# Patient Record
Sex: Male | Born: 1950 | Race: Black or African American | Hispanic: No | Marital: Married | Smoking: Never smoker
Health system: Southern US, Community
[De-identification: ages and names within clinical notes are randomized; demographics above are authoritative.]

## PROBLEM LIST (undated history)

## (undated) DIAGNOSIS — H409 Unspecified glaucoma: Secondary | ICD-10-CM

## (undated) DIAGNOSIS — I10 Essential (primary) hypertension: Secondary | ICD-10-CM

## (undated) DIAGNOSIS — N189 Chronic kidney disease, unspecified: Secondary | ICD-10-CM

## (undated) DIAGNOSIS — E119 Type 2 diabetes mellitus without complications: Secondary | ICD-10-CM

## (undated) DIAGNOSIS — N400 Enlarged prostate without lower urinary tract symptoms: Secondary | ICD-10-CM

## (undated) HISTORY — PX: VASECTOMY: SHX75

## (undated) HISTORY — PX: EYE SURGERY: SHX253

---

## 1998-08-08 ENCOUNTER — Ambulatory Visit (HOSPITAL_COMMUNITY): Admission: RE | Admit: 1998-08-08 | Discharge: 1998-08-09 | Payer: Self-pay | Admitting: Ophthalmology

## 1998-08-08 ENCOUNTER — Encounter: Payer: Self-pay | Admitting: Ophthalmology

## 2017-12-30 ENCOUNTER — Ambulatory Visit: Payer: Self-pay | Admitting: General Surgery

## 2018-01-13 NOTE — Pre-Procedure Instructions (Signed)
Philip Benjamin  01/13/2018      [facility] - [place], [place] - [place], [place] - [address] [place] [address] Phone: [phone] Fax: [phone]    Your procedure is scheduled on Fri., January 23, 2018  Report to [facility] Admitting Entrance "A" at 8:15AM  Call this number if you have problems the morning of surgery:  [phone]   Remember:  Do not eat food or drink liquids after midnight.  Take these medicines the morning of surgery with A SIP OF WATER: Diltiazem (TIAZAC), Metoprolol tartrate (LOPRESSOR), and Dorzolamide-timolol (COSOPT). If needed Lifitegrast Benay Spice) for dry eyes.  7 days before surgery (Mar. 29), stop taking all Aspirins, Vitamins, Fish oils, and Herbal medications. Also stop all NSAIDS i.e. Advil, Ibuprofen, Motrin, Aleve, Anaprox, Naproxen, BC and Goody Powders.  Please complete your PRE-SURGERY ENSURE that was given to before you leave your house the morning of surgery.  Please, if able, drink it in one setting. DO NOT SIP.  How to Manage Your Diabetes Before and After Surgery  Why is it important to control my blood sugar before and after surgery? . Improving blood sugar levels before and after surgery helps healing and can limit problems. . A way of improving blood sugar control is eating a healthy diet by: o  Eating less sugar and carbohydrates o  Increasing activity/exercise o  Talking with your doctor about reaching your blood sugar goals . High blood sugars (greater than 180 mg/dL) can raise your risk of infections and slow your recovery, so you will need to focus on controlling your diabetes during the weeks before surgery. . Make sure that the doctor who takes care of your diabetes knows about your planned surgery including the date and location.  How do I manage my blood sugar before surgery? . Check your blood sugar at least 4 times a day, starting 2 days before surgery, to make sure that the level is not too  high or low. o Check your blood sugar the morning of your surgery when you wake up and every 2 hours until you get to the Short Stay unit. . If your blood sugar is less than 70 mg/dL, you will need to treat for low blood sugar: o Do not take insulin. o Treat a low blood sugar (less than 70 mg/dL) with  cup of clear juice (cranberry or apple), 4 glucose tablets, OR glucose gel. Recheck blood sugar in 15 minutes after treatment (to make sure it is greater than 70 mg/dL). If your blood sugar is not greater than 70 mg/dL on recheck, call [phone] o  for further instructions. . Report your blood sugar to the short stay nurse when you get to Short Stay.  . If you are admitted to the hospital after surgery: o Your blood sugar will be checked by the staff and you will probably be given insulin after surgery (instead of oral diabetes medicines) to make sure you have good blood sugar levels. o The goal for blood sugar control after surgery is 80-180 mg/dL.  WHAT DO I DO ABOUT MY DIABETES MEDICATION?  . THE NIGHT BEFORE SURGERY, take _____25______ units of ______Lantus_____insulin.      . If your CBG is greater than 220 mg/dL, call us at [phone]   Do not wear jewelry.  Do not wear lotions, powders, colognes, or deodorant.  Do not shave 48 hours prior to surgery.  Men may shave face and neck.  Do not bring valuables to the hospital.  [facility] is not responsible for any belongings or valuables.  Contacts, dentures or bridgework may not be worn into surgery.  Leave your suitcase in the car.  After surgery it may be brought to your room.  For patients admitted to the hospital, discharge time will be determined by your treatment team.  Patients discharged the day of surgery will not be allowed to drive home.   Special instructions:   [place]- Preparing For Surgery  Before surgery, you can play an important role. Because skin is not sterile, your skin needs to be as free of germs as  possible. You can reduce the number of germs on your skin by washing with CHG (chlorahexidine gluconate) Soap before surgery.  CHG is an antiseptic cleaner which kills germs and bonds with the skin to continue killing germs even after washing.  Please do not use if you have an allergy to CHG or antibacterial soaps. If your skin becomes reddened/irritated stop using the CHG.  Do not shave (including legs and underarms) for at least 48 hours prior to first CHG shower. It is OK to shave your face.  Please follow these instructions carefully.   1. Shower the NIGHT BEFORE SURGERY and the MORNING OF SURGERY with CHG.   2. If you chose to wash your hair, wash your hair first as usual with your normal shampoo.  3. After you shampoo, rinse your hair and body thoroughly to remove the shampoo.  4. Use CHG as you would any other liquid soap. You can apply CHG directly to the skin and wash gently with a scrungie or a clean washcloth.   5. Apply the CHG Soap to your body ONLY FROM THE NECK DOWN.  Do not use on open wounds or open sores. Avoid contact with your eyes, ears, mouth and genitals (private parts). Wash Face and genitals (private parts)  with your normal soap.  6. Wash thoroughly, paying special attention to the area where your surgery will be performed.  7. Thoroughly rinse your body with warm water from the neck down.  8. DO NOT shower/wash with your normal soap after using and rinsing off the CHG Soap.  9. Pat yourself dry with a CLEAN TOWEL.  10. Wear CLEAN PAJAMAS to bed the night before surgery, wear comfortable clothes the morning of surgery  11. Place CLEAN SHEETS on your bed the night of your first shower and DO NOT SLEEP WITH PETS.  Day of Surgery: Do not apply any deodorants/lotions. Please wear clean clothes to the hospital/surgery center.    Please read over the following fact sheets that you were given. Pain Booklet, Coughing and Deep Breathing and Surgical Site Infection  Prevention

## 2018-01-14 ENCOUNTER — Encounter (HOSPITAL_COMMUNITY): Payer: Self-pay

## 2018-01-14 ENCOUNTER — Encounter (HOSPITAL_COMMUNITY)
Admission: RE | Admit: 2018-01-14 | Discharge: 2018-01-14 | Disposition: A | Discharge status: Home / Self Care | Payer: PRIVATE HEALTH INSURANCE | Source: Ambulatory Visit | Attending: General Surgery | Admitting: General Surgery

## 2018-01-14 DIAGNOSIS — Z01812 Encounter for preprocedural laboratory examination: Secondary | ICD-10-CM | POA: Insufficient documentation

## 2018-01-14 DIAGNOSIS — Z01818 Encounter for other preprocedural examination: Secondary | ICD-10-CM | POA: Insufficient documentation

## 2018-01-14 DIAGNOSIS — R9431 Abnormal electrocardiogram [ECG] [EKG]: Secondary | ICD-10-CM | POA: Diagnosis not present

## 2018-01-14 DIAGNOSIS — J9811 Atelectasis: Secondary | ICD-10-CM | POA: Insufficient documentation

## 2018-01-14 DIAGNOSIS — Z01810 Encounter for preprocedural cardiovascular examination: Secondary | ICD-10-CM | POA: Diagnosis present

## 2018-01-14 DIAGNOSIS — I10 Essential (primary) hypertension: Secondary | ICD-10-CM | POA: Diagnosis not present

## 2018-01-14 DIAGNOSIS — E1122 Type 2 diabetes mellitus with diabetic chronic kidney disease: Secondary | ICD-10-CM | POA: Insufficient documentation

## 2018-01-14 DIAGNOSIS — R918 Other nonspecific abnormal finding of lung field: Secondary | ICD-10-CM | POA: Insufficient documentation

## 2018-01-14 DIAGNOSIS — N183 Chronic kidney disease, stage 3 (moderate): Secondary | ICD-10-CM | POA: Insufficient documentation

## 2018-01-14 HISTORY — DX: Type 2 diabetes mellitus without complications: E11.9

## 2018-01-14 HISTORY — DX: Essential (primary) hypertension: I10

## 2018-01-14 HISTORY — DX: Chronic kidney disease, unspecified: N18.9

## 2018-01-14 HISTORY — DX: Benign prostatic hyperplasia without lower urinary tract symptoms: N40.0

## 2018-01-14 LAB — HEMOGLOBIN A1C
HEMOGLOBIN A1C: 7 % — AB (ref 4.8–5.6)
Mean Plasma Glucose: 154.2 mg/dL

## 2018-01-14 LAB — CBC WITH DIFFERENTIAL/PLATELET
BASOS ABS: 0 10*3/uL (ref 0.0–0.1)
Basophils Relative: 1 %
EOS PCT: 2 %
Eosinophils Absolute: 0.2 10*3/uL (ref 0.0–0.7)
HCT: 38.1 % — ABNORMAL LOW (ref 39.0–52.0)
Hemoglobin: 12.2 g/dL — ABNORMAL LOW (ref 13.0–17.0)
LYMPHS ABS: 1.2 10*3/uL (ref 0.7–4.0)
LYMPHS PCT: 19 %
MCH: 28.4 pg (ref 26.0–34.0)
MCHC: 32 g/dL (ref 30.0–36.0)
MCV: 88.8 fL (ref 78.0–100.0)
MONO ABS: 0.6 10*3/uL (ref 0.1–1.0)
Monocytes Relative: 9 %
Neutro Abs: 4.4 10*3/uL (ref 1.7–7.7)
Neutrophils Relative %: 69 %
PLATELETS: 178 10*3/uL (ref 150–400)
RBC: 4.29 MIL/uL (ref 4.22–5.81)
RDW: 12.7 % (ref 11.5–15.5)
WBC: 6.4 10*3/uL (ref 4.0–10.5)

## 2018-01-14 LAB — COMPREHENSIVE METABOLIC PANEL
ALT: 25 U/L (ref 17–63)
ANION GAP: 11 (ref 5–15)
AST: 23 U/L (ref 15–41)
Albumin: 3.4 g/dL — ABNORMAL LOW (ref 3.5–5.0)
Alkaline Phosphatase: 105 U/L (ref 38–126)
BUN: 18 mg/dL (ref 6–20)
CHLORIDE: 105 mmol/L (ref 101–111)
CO2: 20 mmol/L — ABNORMAL LOW (ref 22–32)
Calcium: 9.3 mg/dL (ref 8.9–10.3)
Creatinine, Ser: 1.76 mg/dL — ABNORMAL HIGH (ref 0.61–1.24)
GFR calc non Af Amer: 38 mL/min — ABNORMAL LOW (ref >60)
GFR, EST AFRICAN AMERICAN: 44 mL/min — AB (ref >60)
Glucose, Bld: 162 mg/dL — ABNORMAL HIGH (ref 65–99)
POTASSIUM: 4.5 mmol/L (ref 3.5–5.1)
Sodium: 136 mmol/L (ref 135–145)
Total Bilirubin: 0.6 mg/dL (ref 0.3–1.2)
Total Protein: 6.3 g/dL — ABNORMAL LOW (ref 6.5–8.1)

## 2018-01-14 LAB — GLUCOSE, CAPILLARY: GLUCOSE-CAPILLARY: 143 mg/dL — AB (ref 65–99)

## 2018-01-14 MED ORDER — CHLORHEXIDINE GLUCONATE CLOTH 2 % EX PADS: 6.0000 | MEDICATED_PAD | Freq: Once | CUTANEOUS | Status: DC

## 2018-01-16 ENCOUNTER — Encounter (HOSPITAL_COMMUNITY): Payer: Self-pay

## 2018-01-16 NOTE — Progress Notes (Signed)
Anesthesia Chart Review:  Pt is a 67 year old male scheduled for open repair bilateral inguinal hernia with mesh on 01/23/2018 with Clement SayresJames White, MD  - PCP is Kirstie PeriAshish Shah, MD - Nephrologist is Dr. Greggory StallionGeorge at [facility] in [place], [place]. Last office visit 11/03/17  PMH includes: HTN, DM, CKD (stage III). Never smoker. BMI 33.5  Medications include: Lipitor, diltiazem, Lasix, Lantus, losartan, metoprolol  No VS documented at pre-admission testing  Preoperative labs reviewed.   - HbA1c 7.0, glucose 162 - Cr 1.76, BUN 18. Nephrology note indicates pt's baseline Cr is 1.6  CXR 01/14/18: Bibasilar atelectasis/scarring.  EKG 01/14/18: NSR.  Repolarization abnormality.  If no changes, I anticipate pt can proceed with surgery as scheduled.   Rica Mastngela Laylani Pudwill, FNP-BC [facility] Short Stay Surgical Center/Anesthesiology Phone: [phone] 01/19/2018 4:23 PM

## 2018-01-22 NOTE — H&P (Signed)
Philip Benjamin Documented: 12/30/2017 10:21 AM Location: [facility] Patient #: [id number] DOB: [birth year] Married / Language: English / Race: Black or African American Male   History of Present Illness Marta Lamas. Lindie Spruce MD; 12/30/2017 10:46 AM) Patient words: Hernias detected by my primary physician.  The patient is a 67 year old male who presents with an inguinal hernia. The hernia(s) is/are located on both sides (Large). No changes in management were made at the last visit. Symptoms include inguinal bulge, scrotal mass, inguinal pain and scrotal pain. The pain is located in the left hemiscrotum, in the left lower quadrant, in the right hemiscrotum and in the right lower quadrant. The patient describes the pain as dull. Onset was gradual 2 year(s) ago. The symptoms occur occasionally. The patient describes this as mild.   Past Surgical History (Tanisha A. Manson Passey, RMA; 12/30/2017 10:22 AM) Vasectomy   Diagnostic Studies History (Tanisha A. Manson Passey, RMA; 12/30/2017 10:22 AM) Colonoscopy  1-5 years ago  Allergies (Tanisha A. Manson Passey, RMA; 12/30/2017 10:23 AM) No Known Drug Allergies [12/30/2017]: Allergies Reconciled   Medication History (Tanisha A. Manson Passey, RMA; 12/30/2017 10:23 AM) Losartan Potassium (100MG  Tablet, Oral) Active. Metoprolol Tartrate (25MG  Tablet, Oral) Active. Lantus (100UNIT/ML Solution, Subcutaneous) Active. Furosemide (20MG  Tablet, Oral) Active. Brimonidine Tartrate (0.15% Solution, Ophthalmic) Active. Atorvastatin Calcium (20MG  Tablet, Oral) Active. Vitamin D (Ergocalciferol) (50000UNIT Capsule, Oral) Active. Dorzolamide HCl-Timolol Mal (22.3-6.8MG /ML Solution, Ophthalmic) Active. Durezol (0.05% Emulsion, Ophthalmic) Active. Medications Reconciled  Social History (Tanisha A. Manson Passey, RMA; 12/30/2017 10:22 AM) Caffeine use  Carbonated beverages, Tea. No alcohol use  No drug use  Tobacco use  Never smoker.  Family History (Tanisha A. Manson Passey,  RMA; 12/30/2017 10:22 AM) Alcohol Abuse  Brother, Father, Mother. Diabetes Mellitus  Brother, Mother. Heart Disease  Brother, Mother. Hypertension  Brother, Mother. Kidney Disease  Brother. Prostate Cancer  Father.  Other Problems (Tanisha A. Manson Passey, RMA; 12/30/2017 10:22 AM) Back Pain  Chronic Renal Failure Syndrome  Diabetes Mellitus  Enlarged Prostate  Gastric Ulcer  Hemorrhoids  High blood pressure  Hypercholesterolemia     Review of Systems (Tanisha A. Brown RMA; 12/30/2017 10:22 AM) General Present- Fatigue. Not Present- Appetite Loss, Chills, Fever, Night Sweats, Weight Gain and Weight Loss. Skin Present- Dryness. Not Present- Change in Wart/Mole, Hives, Jaundice, New Lesions, Non-Healing Wounds, Rash and Ulcer. HEENT Present- Ringing in the Ears and Wears glasses/contact lenses. Not Present- Earache, Hearing Loss, Hoarseness, Nose Bleed, Oral Ulcers, Seasonal Allergies, Sinus Pain, Sore Throat, Visual Disturbances and Yellow Eyes. Respiratory Not Present- Bloody sputum, Chronic Cough, Difficulty Breathing, Snoring and Wheezing. Breast Not Present- Breast Mass, Breast Pain, Nipple Discharge and Skin Changes. Cardiovascular Present- Swelling of Extremities. Not Present- Chest Pain, Difficulty Breathing Lying Down, Leg Cramps, Palpitations, Rapid Heart Rate and Shortness of Breath. Gastrointestinal Present- Hemorrhoids. Not Present- Abdominal Pain, Bloating, Bloody Stool, Change in Bowel Habits, Chronic diarrhea, Constipation, Difficulty Swallowing, Excessive gas, Gets full quickly at meals, Indigestion, Nausea, Rectal Pain and Vomiting. Male Genitourinary Present- Impotence. Not Present- Blood in Urine, Change in Urinary Stream, Frequency, Nocturia, Painful Urination, Urgency and Urine Leakage. Musculoskeletal Present- Back Pain and Joint Stiffness. Not Present- Joint Pain, Muscle Pain, Muscle Weakness and Swelling of Extremities. Neurological Present- Tingling. Not  Present- Decreased Memory, Fainting, Headaches, Numbness, Seizures, Tremor, Trouble walking and Weakness. Psychiatric Not Present- Anxiety, Bipolar, Change in Sleep Pattern, Depression, Fearful and Frequent crying. Endocrine Present- Cold Intolerance. Not Present- Excessive Hunger, Hair Changes, Heat Intolerance, Hot flashes and New Diabetes. Hematology Not Present-  Blood Thinners, Easy Bruising, Excessive bleeding, Gland problems, HIV and Persistent Infections.  Vitals (Tanisha A. Brown RMA; 12/30/2017 10:22 AM) 12/30/2017 10:22 AM Weight: 258.6 lb Height: 73in Body Surface Area: 2.4 m Body Mass Index: 34.12 kg/m  Temp.: 98.43F  Pulse: 80 (Regular)  BP: 148/84 (Sitting, Left Arm, Standard) BP elevated today at 180/92, P 77  General Mental Status-Alert. General Appearance-Cooperative and Well groomed. Orientation-Oriented X4. Build & Nutrition-Obese(Mild). Voice-Normal. Health Status-General Health Good(Has Stage III CKD).  Chest and Lung Exam Chest and lung exam reveals -normal excursion with symmetric chest walls, quiet, even and easy respiratory effort with no use of accessory muscles, non-tender and normal tactile fremitus and on auscultation, normal breath sounds, no adventitious sounds and normal vocal resonance.  Cardiovascular Cardiovascular examination reveals -on palpation PMI is normal in location and amplitude, no palpable S3 or S4. Normal cardiac borders., normal heart sounds, regular rate and rhythm with no murmurs and femoral artery auscultation bilaterally reveals normal pulses, no bruits, no thrills.  Abdomen Inspection Hernias - Inguinal hernia - Bilateral - Reducible(Large and reducible with immediate return). Palpation/Percussion Palpation and Percussion of the abdomen reveal - Soft and Non Tender. Auscultation Auscultation of the abdomen reveals - Bowel sounds normal.  Assessment & Plan Fayrene Fearing O. Temia Debroux MD; 12/30/2017 10:55  AM) BILATERAL INGUINAL HERNIA (K40.20) Impression: Large Bilateral inguinal hernias in a large man. Reducible but immediately recurs. Minimally tender.  Will need open repairs bilaterally with mesh. Risks and benefits explained to the patient Current Plans:  Open bilateral inguinal hernia repair with mesh  Marta Lamas. Gae Bon, MD, FACS [phone] [facility]

## 2018-01-23 ENCOUNTER — Encounter (HOSPITAL_COMMUNITY): Payer: Self-pay | Admitting: Surgery

## 2018-01-23 ENCOUNTER — Ambulatory Visit (HOSPITAL_COMMUNITY): Payer: PRIVATE HEALTH INSURANCE | Admitting: Vascular Surgery

## 2018-01-23 ENCOUNTER — Other Ambulatory Visit: Payer: Self-pay

## 2018-01-23 ENCOUNTER — Ambulatory Visit (HOSPITAL_COMMUNITY)
Admission: RE | Admit: 2018-01-23 | Discharge: 2018-01-23 | Disposition: A | Discharge status: Home / Self Care | Payer: PRIVATE HEALTH INSURANCE | Source: Ambulatory Visit | Attending: General Surgery | Admitting: General Surgery

## 2018-01-23 ENCOUNTER — Encounter (HOSPITAL_COMMUNITY)
Admission: RE | Disposition: A | Discharge status: Home / Self Care | Payer: Self-pay | Source: Ambulatory Visit | Attending: General Surgery

## 2018-01-23 ENCOUNTER — Ambulatory Visit (HOSPITAL_COMMUNITY): Payer: PRIVATE HEALTH INSURANCE | Admitting: Emergency Medicine

## 2018-01-23 DIAGNOSIS — E1122 Type 2 diabetes mellitus with diabetic chronic kidney disease: Secondary | ICD-10-CM | POA: Diagnosis not present

## 2018-01-23 DIAGNOSIS — I129 Hypertensive chronic kidney disease with stage 1 through stage 4 chronic kidney disease, or unspecified chronic kidney disease: Secondary | ICD-10-CM | POA: Insufficient documentation

## 2018-01-23 DIAGNOSIS — N189 Chronic kidney disease, unspecified: Secondary | ICD-10-CM | POA: Diagnosis not present

## 2018-01-23 DIAGNOSIS — Z794 Long term (current) use of insulin: Secondary | ICD-10-CM | POA: Diagnosis not present

## 2018-01-23 DIAGNOSIS — K4020 Bilateral inguinal hernia, without obstruction or gangrene, not specified as recurrent: Secondary | ICD-10-CM | POA: Insufficient documentation

## 2018-01-23 DIAGNOSIS — E7800 Pure hypercholesterolemia, unspecified: Secondary | ICD-10-CM | POA: Insufficient documentation

## 2018-01-23 DIAGNOSIS — Z79899 Other long term (current) drug therapy: Secondary | ICD-10-CM | POA: Insufficient documentation

## 2018-01-23 HISTORY — PX: INSERTION OF MESH: SHX5868

## 2018-01-23 HISTORY — PX: INGUINAL HERNIA REPAIR: SHX194

## 2018-01-23 LAB — GLUCOSE, CAPILLARY
GLUCOSE-CAPILLARY: 125 mg/dL — AB (ref 65–99)
Glucose-Capillary: 295 mg/dL — ABNORMAL HIGH (ref 65–99)
Glucose-Capillary: 218 mg/dL — ABNORMAL HIGH (ref 65–99)

## 2018-01-23 SURGERY — REPAIR, HERNIA, INGUINAL, BILATERAL, ADULT
Anesthesia: General | Site: Groin | Laterality: Bilateral

## 2018-01-23 MED ORDER — OXYCODONE HCL 5 MG PO TABS: 5.0000 mg | ORAL_TABLET | Freq: Four times a day (QID) | ORAL | 0 refills | Status: DC | PRN

## 2018-01-23 MED ORDER — SODIUM CHLORIDE 0.9 % IV SOLN
INTRAVENOUS | Status: AC
  Filled 2018-01-23: qty 500000

## 2018-01-23 MED ORDER — BUPIVACAINE-EPINEPHRINE (PF) 0.5% -1:200000 IJ SOLN
INTRAMUSCULAR | Status: AC
  Filled 2018-01-23: qty 30

## 2018-01-23 MED ORDER — EPHEDRINE SULFATE 50 MG/ML IJ SOLN
INTRAMUSCULAR | Status: DC | PRN
  Administered 2018-01-23 (×2): 5 mg via INTRAVENOUS
  Administered 2018-01-23: 10 mg via INTRAVENOUS

## 2018-01-23 MED ORDER — BUPIVACAINE HCL (PF) 0.25 % IJ SOLN
INTRAMUSCULAR | Status: AC
  Filled 2018-01-23: qty 30

## 2018-01-23 MED ORDER — HYDROCODONE-ACETAMINOPHEN 7.5-325 MG PO TABS: 1.0000 | ORAL_TABLET | Freq: Once | ORAL | Status: DC | PRN

## 2018-01-23 MED ORDER — PROMETHAZINE HCL 25 MG/ML IJ SOLN
6.2500 mg | INTRAMUSCULAR | Status: DC | PRN
  Administered 2018-01-23: 6.25 mg via INTRAVENOUS

## 2018-01-23 MED ORDER — LIDOCAINE HCL (PF) 1 % IJ SOLN
INTRAMUSCULAR | Status: AC
  Filled 2018-01-23: qty 30

## 2018-01-23 MED ORDER — ONDANSETRON HCL 4 MG/2ML IJ SOLN
INTRAMUSCULAR | Status: DC | PRN
  Administered 2018-01-23: 4 mg via INTRAVENOUS

## 2018-01-23 MED ORDER — ACETAMINOPHEN 500 MG PO TABS
1000.0000 mg | ORAL_TABLET | ORAL | Status: AC
  Administered 2018-01-23: 1000 mg via ORAL

## 2018-01-23 MED ORDER — LIDOCAINE HCL (CARDIAC) 20 MG/ML IV SOLN
INTRAVENOUS | Status: AC
  Filled 2018-01-23: qty 5

## 2018-01-23 MED ORDER — FENTANYL CITRATE (PF) 100 MCG/2ML IJ SOLN
INTRAMUSCULAR | Status: DC | PRN
  Administered 2018-01-23 (×3): 50 ug via INTRAVENOUS

## 2018-01-23 MED ORDER — FENTANYL CITRATE (PF) 100 MCG/2ML IJ SOLN
INTRAMUSCULAR | Status: AC
  Filled 2018-01-23: qty 2

## 2018-01-23 MED ORDER — SUGAMMADEX SODIUM 200 MG/2ML IV SOLN
INTRAVENOUS | Status: DC | PRN
  Administered 2018-01-23: 300 mg via INTRAVENOUS

## 2018-01-23 MED ORDER — INSULIN ASPART 100 UNIT/ML ~~LOC~~ SOLN
6.0000 [IU] | Freq: Once | SUBCUTANEOUS | Status: AC
  Administered 2018-01-23: 6 [IU] via SUBCUTANEOUS

## 2018-01-23 MED ORDER — PROPOFOL 10 MG/ML IV BOLUS
INTRAVENOUS | Status: DC | PRN
  Administered 2018-01-23: 50 mg via INTRAVENOUS
  Administered 2018-01-23: 150 mg via INTRAVENOUS

## 2018-01-23 MED ORDER — GABAPENTIN 300 MG PO CAPS
300.0000 mg | ORAL_CAPSULE | ORAL | Status: AC
  Administered 2018-01-23: 300 mg via ORAL

## 2018-01-23 MED ORDER — CEFAZOLIN SODIUM-DEXTROSE 2-4 GM/100ML-% IV SOLN
INTRAVENOUS | Status: AC
  Filled 2018-01-23: qty 100

## 2018-01-23 MED ORDER — CELECOXIB 200 MG PO CAPS
ORAL_CAPSULE | ORAL | Status: AC
  Administered 2018-01-23: 200 mg via ORAL
  Filled 2018-01-23: qty 1

## 2018-01-23 MED ORDER — MIDAZOLAM HCL 2 MG/2ML IJ SOLN
INTRAMUSCULAR | Status: AC
  Filled 2018-01-23: qty 2

## 2018-01-23 MED ORDER — EPHEDRINE 5 MG/ML INJ
INTRAVENOUS | Status: AC
  Filled 2018-01-23: qty 10

## 2018-01-23 MED ORDER — SUGAMMADEX SODIUM 200 MG/2ML IV SOLN
INTRAVENOUS | Status: AC
  Filled 2018-01-23: qty 4

## 2018-01-23 MED ORDER — 0.9 % SODIUM CHLORIDE (POUR BTL) OPTIME
TOPICAL | Status: DC | PRN
  Administered 2018-01-23: 1000 mL

## 2018-01-23 MED ORDER — ROCURONIUM BROMIDE 10 MG/ML (PF) SYRINGE
PREFILLED_SYRINGE | INTRAVENOUS | Status: AC
  Filled 2018-01-23: qty 5

## 2018-01-23 MED ORDER — ROCURONIUM BROMIDE 100 MG/10ML IV SOLN
INTRAVENOUS | Status: DC | PRN
  Administered 2018-01-23: 30 mg via INTRAVENOUS

## 2018-01-23 MED ORDER — SODIUM CHLORIDE 0.9 % IV SOLN
INTRAVENOUS | Status: DC | PRN
  Administered 2018-01-23: 500 mL

## 2018-01-23 MED ORDER — ACETAMINOPHEN 10 MG/ML IV SOLN: 1000.0000 mg | Freq: Once | INTRAVENOUS | Status: DC | PRN

## 2018-01-23 MED ORDER — FENTANYL CITRATE (PF) 250 MCG/5ML IJ SOLN
INTRAMUSCULAR | Status: AC
  Filled 2018-01-23: qty 5

## 2018-01-23 MED ORDER — CEFAZOLIN SODIUM-DEXTROSE 2-4 GM/100ML-% IV SOLN
2.0000 g | INTRAVENOUS | Status: AC
  Administered 2018-01-23: 2 g via INTRAVENOUS

## 2018-01-23 MED ORDER — CELECOXIB 200 MG PO CAPS
200.0000 mg | ORAL_CAPSULE | ORAL | Status: AC
  Administered 2018-01-23: 200 mg via ORAL

## 2018-01-23 MED ORDER — ONDANSETRON HCL 4 MG/2ML IJ SOLN
INTRAMUSCULAR | Status: AC
  Filled 2018-01-23: qty 2

## 2018-01-23 MED ORDER — GABAPENTIN 300 MG PO CAPS
ORAL_CAPSULE | ORAL | Status: AC
  Administered 2018-01-23: 300 mg via ORAL
  Filled 2018-01-23: qty 1

## 2018-01-23 MED ORDER — LACTATED RINGERS IV SOLN
INTRAVENOUS | Status: DC
  Administered 2018-01-23 (×2): via INTRAVENOUS

## 2018-01-23 MED ORDER — PROMETHAZINE HCL 25 MG/ML IJ SOLN
INTRAMUSCULAR | Status: AC
  Filled 2018-01-23: qty 1

## 2018-01-23 MED ORDER — PROPOFOL 10 MG/ML IV BOLUS
INTRAVENOUS | Status: AC
  Filled 2018-01-23: qty 20

## 2018-01-23 MED ORDER — HYDROMORPHONE HCL 1 MG/ML IJ SOLN: 0.2500 mg | INTRAMUSCULAR | Status: DC | PRN

## 2018-01-23 MED ORDER — BUPIVACAINE-EPINEPHRINE (PF) 0.5% -1:200000 IJ SOLN
INTRAMUSCULAR | Status: DC | PRN
  Administered 2018-01-23: 30 mL

## 2018-01-23 MED ORDER — MEPERIDINE HCL 50 MG/ML IJ SOLN: 6.2500 mg | INTRAMUSCULAR | Status: DC | PRN

## 2018-01-23 MED ORDER — SUCCINYLCHOLINE CHLORIDE 20 MG/ML IJ SOLN
INTRAMUSCULAR | Status: DC | PRN
  Administered 2018-01-23: 140 mg via INTRAVENOUS

## 2018-01-23 MED ORDER — DEXAMETHASONE SODIUM PHOSPHATE 10 MG/ML IJ SOLN
INTRAMUSCULAR | Status: AC
  Filled 2018-01-23: qty 1

## 2018-01-23 MED ORDER — LIDOCAINE HCL (CARDIAC) 20 MG/ML IV SOLN
INTRAVENOUS | Status: DC | PRN
  Administered 2018-01-23: 70 mg via INTRAVENOUS

## 2018-01-23 MED ORDER — ACETAMINOPHEN 500 MG PO TABS
ORAL_TABLET | ORAL | Status: AC
  Administered 2018-01-23: 1000 mg via ORAL
  Filled 2018-01-23: qty 2

## 2018-01-23 SURGICAL SUPPLY — 55 items
ADH SKN CLS APL DERMABOND .7 (GAUZE/BANDAGES/DRESSINGS) ×1
BAG DECANTER FOR FLEXI CONT (MISCELLANEOUS) ×3 IMPLANT
BLADE CLIPPER SURG (BLADE) IMPLANT
CANISTER SUCT 3000ML PPV (MISCELLANEOUS) IMPLANT
CHLORAPREP W/TINT 26ML (MISCELLANEOUS) ×3 IMPLANT
CLEANER TIP ELECTROSURG 2X2 (MISCELLANEOUS) ×3 IMPLANT
CLOSURE WOUND 1/2 X4 (GAUZE/BANDAGES/DRESSINGS) ×2
COVER SURGICAL LIGHT HANDLE (MISCELLANEOUS) ×3 IMPLANT
DECANTER SPIKE VIAL GLASS SM (MISCELLANEOUS) IMPLANT
DERMABOND ADVANCED (GAUZE/BANDAGES/DRESSINGS) ×2
DERMABOND ADVANCED .7 DNX12 (GAUZE/BANDAGES/DRESSINGS) ×1 IMPLANT
DRAIN PENROSE 1/2X12 LTX STRL (WOUND CARE) ×2 IMPLANT
DRAPE LAPAROTOMY TRNSV 102X78 (DRAPE) ×3 IMPLANT
DRAPE UTILITY XL STRL (DRAPES) ×6 IMPLANT
DRSG TEGADERM 4X4.75 (GAUZE/BANDAGES/DRESSINGS) ×4 IMPLANT
ELECT REM PT RETURN 9FT ADLT (ELECTROSURGICAL) ×3
ELECTRODE REM PT RTRN 9FT ADLT (ELECTROSURGICAL) ×1 IMPLANT
GAUZE SPONGE 4X4 12PLY STRL (GAUZE/BANDAGES/DRESSINGS) ×2 IMPLANT
GAUZE SPONGE 4X4 16PLY XRAY LF (GAUZE/BANDAGES/DRESSINGS) ×3 IMPLANT
GLOVE BIOGEL PI IND STRL 6.5 (GLOVE) IMPLANT
GLOVE BIOGEL PI IND STRL 8 (GLOVE) ×1 IMPLANT
GLOVE BIOGEL PI INDICATOR 6.5 (GLOVE) ×2
GLOVE BIOGEL PI INDICATOR 8 (GLOVE) ×2
GLOVE ECLIPSE 7.5 STRL STRAW (GLOVE) ×3 IMPLANT
GLOVE SURG SS PI 6.5 STRL IVOR (GLOVE) ×2 IMPLANT
GLOVE SURG SS PI 7.0 STRL IVOR (GLOVE) ×2 IMPLANT
GOWN STRL REUS W/ TWL LRG LVL3 (GOWN DISPOSABLE) ×2 IMPLANT
GOWN STRL REUS W/TWL LRG LVL3 (GOWN DISPOSABLE) ×6
KIT BASIN OR (CUSTOM PROCEDURE TRAY) ×3 IMPLANT
KIT TURNOVER KIT B (KITS) ×3 IMPLANT
MESH HERNIA 3X6 (Mesh General) ×2 IMPLANT
NDL HYPO 25GX1X1/2 BEV (NEEDLE) ×1 IMPLANT
NEEDLE HYPO 25GX1X1/2 BEV (NEEDLE) ×3 IMPLANT
NS IRRIG 1000ML POUR BTL (IV SOLUTION) ×3 IMPLANT
PACK SURGICAL SETUP 50X90 (CUSTOM PROCEDURE TRAY) ×3 IMPLANT
PAD ARMBOARD 7.5X6 YLW CONV (MISCELLANEOUS) ×6 IMPLANT
PENCIL BUTTON HOLSTER BLD 10FT (ELECTRODE) ×3 IMPLANT
SPECIMEN JAR SMALL (MISCELLANEOUS) IMPLANT
SPONGE INTESTINAL PEANUT (DISPOSABLE) ×3 IMPLANT
SPONGE LAP 18X18 X RAY DECT (DISPOSABLE) ×3 IMPLANT
STRIP CLOSURE SKIN 1/2X4 (GAUZE/BANDAGES/DRESSINGS) ×3 IMPLANT
SUT ETHIBOND 0 MO6 C/R (SUTURE) ×6 IMPLANT
SUT MON AB 4-0 PC3 18 (SUTURE) ×6 IMPLANT
SUT PROLENE 0 CT 2 (SUTURE) ×12 IMPLANT
SUT VIC AB 3-0 SH 27 (SUTURE) ×15
SUT VIC AB 3-0 SH 27X BRD (SUTURE) ×4 IMPLANT
SUT VICRYL AB 3 0 TIES (SUTURE) ×6 IMPLANT
SYR BULB 3OZ (MISCELLANEOUS) ×3 IMPLANT
SYR CONTROL 10ML LL (SYRINGE) ×3 IMPLANT
TOWEL OR 17X24 6PK STRL BLUE (TOWEL DISPOSABLE) ×3 IMPLANT
TOWEL OR 17X26 10 PK STRL BLUE (TOWEL DISPOSABLE) ×3 IMPLANT
TRAY URETHRAL FOLEY CATH 14FR (CATHETERS) ×2 IMPLANT
TUBE CONNECTING 12'X1/4 (SUCTIONS) ×2
TUBE CONNECTING 12X1/4 (SUCTIONS) ×2 IMPLANT
YANKAUER SUCT BULB TIP NO VENT (SUCTIONS) ×4 IMPLANT

## 2018-01-23 NOTE — Discharge Instructions (Addendum)
Open Inguinal Hernia Repair, Adult, Care After This sheet gives you information about how to care for yourself after your procedure. Your health care provider may also give you more specific instructions. If you have problems or questions, contact your health care provider. What can I expect after the procedure? After the procedure, it is common to have:  Pain.  Swelling and bruising around the incision area.  Scrotal swelling, in men.  Some fluid or blood draining from your incisions.  Follow these instructions at home: Incision care  Follow instructions from your health care provider about how to take care of your incisions. Make sure you: ? Wash your hands with soap and water before you change your bandage (dressing). If soap and water are not available, use hand sanitizer. ? Change your dressing as told by your health care provider. ? Leave stitches (sutures), skin glue, or adhesive strips in place. These skin closures may need to stay in place for 2 weeks or longer. If adhesive strip edges start to loosen and curl up, you may trim the loose edges. Do not remove adhesive strips completely unless your health care provider tells you to do that.  Check your incision area every day for signs of infection. Check for: ? More redness, swelling, or pain. ? More fluid or blood. ? Warmth. ? Pus or a bad smell.  Wear loose, soft clothing while your incisions heal.  Keep dressings intact until seen in clinic Driving  Do not drive or use heavy machinery while taking prescription pain medicine.  Do not drive for 24 hours if you were given a medicine to help you relax (sedative) during your procedure. Activity  Do not lift anything that is heavier than 10 lb (4.5 kg), or the limit that you are told, until your health care provider says that it is safe.  Ask your health care provider what activities are safe for you.A lot of activity during the first week after surgery can increase pain  and swelling. For 1 week after your procedure: ? Avoid activities that take a lot of effort, such as exercise or sports. ? You may walk and climb stairs as needed for daily activity, but avoid long walks or climbing stairs for exercise. Managing pain and swelling  Put ice on painful or swollen areas: ? Put ice in a plastic bag. ? Place a towel between your skin and the bag. ? Leave the ice on for 20 minutes, 2-3 times a day. General instructions  Do not take baths, swim, or use a hot tub until your health care provider approves. Ask your health care provider if you may take showers. You may only be allowed to take sponge baths.  Take over-the-counter and prescription medicines only as told by your health care provider.  To prevent or treat constipation while you are taking prescription pain medicine, your health care provider may recommend that you: ? Drink enough fluid to keep your urine pale yellow. ? Take over-the-counter or prescription medicines. ? Eat foods that are high in fiber, such as fresh fruits and vegetables, whole grains, and beans. ? Limit foods that are high in fat and processed sugars, such as fried and sweet foods.  Do not use any products that contain nicotine or tobacco, such as cigarettes and e-cigarettes. If you need help quitting, ask your health care provider.  Drink enough fluid to keep your urine pale yellow.  Keep all follow-up visits as told by your health care provider. This is important.  Contact a health care provider if:  You have more redness, swelling, or pain around your incisions or your groin area.  You have more swelling in your scrotum.  You have more fluid or blood coming from your incisions.  Your incisions feel warm to the touch.  You have severe pain and medicines do not help.  You have abdominal pain or swelling.  You cannot eat or drink without vomiting.  You cannot urinate or pass a bowel movement.  You faint.  You feel  dizzy.  You have nausea and vomiting.  You have a fever. Get help right away if:  You have pus or a bad smell coming from your incisions.  You have chest pain.  You have problems breathing. Summary  Pain, swelling, and bruising are common after the procedure.  Check your incision area every day for signs of infection, such as more redness, swelling, or pain.  Put ice on painful or swollen areas for 20 minutes, 2-3 times a day. This information is not intended to replace advice given to you by your health care provider. Make sure you discuss any questions you have with your health care provider.  Marta Lamas. Gae Bon, MD, FACS [phone] [facility]

## 2018-01-23 NOTE — Anesthesia Preprocedure Evaluation (Addendum)
Anesthesia Evaluation  Patient identified by MRN, date of birth, ID band Patient awake    Reviewed: Allergy & Precautions, NPO status , Patient's Chart, lab work & pertinent test results  Airway Mallampati: II  TM Distance: >3 FB Neck ROM: Full    Dental no notable dental hx. (+) Dental Advisory Given, Teeth Intact   Pulmonary neg pulmonary ROS,    Pulmonary exam normal breath sounds clear to auscultation       Cardiovascular Exercise Tolerance: Good hypertension, Pt. on home beta blockers Normal cardiovascular exam Rhythm:Regular Rate:Normal     Neuro/Psych negative neurological ROS  negative psych ROS   GI/Hepatic negative GI ROS, Neg liver ROS,   Endo/Other  diabetes, Well Controlled, Type 1  Renal/GU negative Renal ROS     Musculoskeletal   Abdominal (+) + obese,   Peds negative pediatric ROS (+)  Hematology   Anesthesia Other Findings   Reproductive/Obstetrics                            Lab Results  Component Value Date   WBC 6.4 01/14/2018   HGB 12.2 (L) 01/14/2018   HCT 38.1 (L) 01/14/2018   MCV 88.8 01/14/2018   PLT 178 01/14/2018    Anesthesia Physical Anesthesia Plan  ASA: III  Anesthesia Plan: General   Post-op Pain Management:    Induction: Intravenous  PONV Risk Score and Plan: Treatment may vary due to age or medical condition  Airway Management Planned: Oral ETT  Additional Equipment:   Intra-op Plan:   Post-operative Plan: Extubation in OR  Informed Consent: I have reviewed the patients History and Physical, chart, labs and discussed the procedure including the risks, benefits and alternatives for the proposed anesthesia with the patient or authorized representative who has indicated his/her understanding and acceptance.   Dental advisory given  Plan Discussed with: CRNA  Anesthesia Plan Comments:         Anesthesia Quick Evaluation

## 2018-01-23 NOTE — Op Note (Signed)
OPERATIVE REPORT  DATE OF OPERATION: 01/23/2018  PATIENT:  Philip Benjamin  67 y.o. male  PRE-OPERATIVE DIAGNOSIS:  BILATERAL INGUINAL HERNIA, direct and indirect  POST-OPERATIVE DIAGNOSIS:  BILATERAL INGUINAL HERNIA, direct and indirect  INDICATION(S) FOR OPERATION:  Symptomatic bilateral inguinal hernias  FINDINGS:  Large pantaloon hernias.  The right side was an slider involving the colon, but not incarcerated or strangulated.  Both sizable  PROCEDURE:  Procedure(s): BILATERAL OPEN INGUINAL HERNIA REPAIR WITH MESH INSERTION OF MESH  SURGEON:  Surgeon(s): Jimmye Norman, MD  ASSISTANT: Lance Bosch, MS  ANESTHESIA:   general  COMPLICATIONS:  None  EBL: 20 ml  BLOOD ADMINISTERED: none  DRAINS: none   SPECIMEN:  No Specimen  COUNTS CORRECT:  YES  PROCEDURE DETAILS: The patient was taken to the operating room and placed on the table in the supine position.  After an adequate general endotracheal anesthetic was administered, he was prepped and draped in usual sterile manner exposing his inguinal areas.  A proper timeout was performed identifying the patient and the procedure to be performed.  We started on the right side where transverse curvilinear incision approximately 8-9 cm long was made down into the subcutaneous tissue.  We used electrocautery to safely dissect down to the external oblique fascia.  Once we had the fascial level of the bulging inguinal hernia could be seen very well coming out the superficial ring.  We incised the external oblique fascia along its fibers and opened out through the superficial ring.  The ilioinguinal nerve was transected.  We isolated the spermatic cord at the pubic tubercle and then subsequently dissected away the hernia sac away from the spermatic cord using a Penrose drain to help control the spermatic cord.  The patient had a very large hernia sac on the right side which did not appear to contain any bowel.  We were able to ligate that sac at its  base using Kelly clamps and interrupted U stitches of 0 Ethibond suture.  Once we ligated the sac on the right side we were able to repair the floor using an oval piece of polypropylene mesh measuring approximately 5 x 3 cm in size.  There was sutured to the reflected portion of the inguinal ligament inferolaterally and the conjoined tendon anterior medially.  This is done using a 0 Prolene suture.  Once the mesh was in place and it is been irrigated with antibiotic solution we closed the external oblique fascia on top of the cord using running 3-0 Vicryl suture.  We reapproximated Scarpa's fascia using interrupted 3-0 Vicryl.  We injected 0.5% Marcaine with epinephrine into the subcutaneous tissue around the incision.  All counts were correct on that side.  Subsequent to repairing the right side we repaired the left side in a very similar manner.  On this side however the only difference is that there was a slider of the colon into the sac and the peritoneal injections had to be taken down in order to reduce the hernia.  Once we did so we came across the hernia sac using 0 Ethibond sutures.  Once this was down we repaired the floor in a similar manner.  We have to to have more of the direct sac inside because he did not want a place deep stitches that were perhaps in t  We irrigated with antibiotic solution and closed in a similar manner.  The skin on both sides were closed using running subcuticular suture of 3-0 Monocryl.  Dermabond Steri-Strips and Tegaderm  was used to complete the dressings.  We injected both sites with Marcaine.  All needle counts, sponge counts, and instrument counts were correct.  PATIENT DISPOSITION:  PACU - hemodynamically stable.   Jimmye NormanJames Neils [place]acusa 4/5/20193:04 PM

## 2018-01-23 NOTE — Transfer of Care (Signed)
Immediate Anesthesia Transfer of Care Note  Patient: Philip Benjamin  Procedure(s) Performed: BILATERAL OPEN INGUINAL HERNIA REPAIR WITH MESH (Bilateral Groin) INSERTION OF MESH (Bilateral Groin)  Patient Location: PACU  Anesthesia Type:General  Level of Consciousness: awake and drowsy  Airway & Oxygen Therapy: Patient Spontanous Breathing and Patient connected to nasal cannula oxygen  Post-op Assessment: Report given to RN, Post -op Vital signs reviewed and stable and Patient moving all extremities  Post vital signs: Reviewed and stable  Last Vitals:  Vitals Value Taken Time  BP 128/74 01/23/2018  3:26 PM  Temp 36.4 C 01/23/2018  3:26 PM  Pulse 59 01/23/2018  3:31 PM  Resp 12 01/23/2018  3:31 PM  SpO2 96 % 01/23/2018  3:31 PM  Vitals shown include unvalidated device data.  Last Pain:  Vitals:   01/23/18 1526  TempSrc:   PainSc: (P) 0-No pain      Patients Stated Pain Goal: 3 (01/23/18 0845)  Complications: No apparent anesthesia complications

## 2018-01-23 NOTE — Anesthesia Procedure Notes (Signed)
Procedure Name: Intubation Date/Time: 01/23/2018 12:22 PM Performed by: Orvell Careaga T, CRNA Pre-anesthesia Checklist: Patient identified, Emergency Drugs available, Suction available and Patient being monitored Patient Re-evaluated:Patient Re-evaluated prior to induction Oxygen Delivery Method: Circle system utilized Preoxygenation: Pre-oxygenation with 100% oxygen Induction Type: IV induction Ventilation: Mask ventilation without difficulty and Oral airway inserted - appropriate to patient size Laryngoscope Size: Mac and 4 Grade View: Grade I Tube type: Oral Tube size: 7.5 mm Number of attempts: 1 Airway Equipment and Method: Patient positioned with wedge pillow and Stylet Placement Confirmation: ETT inserted through vocal cords under direct vision,  positive ETCO2 and breath sounds checked- equal and bilateral Secured at: 22 cm Tube secured with: Tape Dental Injury: Teeth and Oropharynx as per pre-operative assessment

## 2018-01-24 NOTE — Anesthesia Postprocedure Evaluation (Signed)
Anesthesia Post Note  Patient: Philip Benjamin  Procedure(s) Performed: BILATERAL OPEN INGUINAL HERNIA REPAIR WITH MESH (Bilateral Groin) INSERTION OF MESH (Bilateral Groin)     Patient location during evaluation: PACU Anesthesia Type: General Level of consciousness: awake and alert Pain management: pain level controlled Vital Signs Assessment: post-procedure vital signs reviewed and stable Respiratory status: spontaneous breathing, nonlabored ventilation and respiratory function stable Cardiovascular status: blood pressure returned to baseline and stable Postop Assessment: no apparent nausea or vomiting Anesthetic complications: no    Last Vitals:  Vitals:   01/23/18 1800 01/23/18 1830  BP: (!) 150/81 (!) 148/78  Pulse: 70 72  Resp: 12   Temp: 36.4 C   SpO2: 99% 99%    Last Pain:  Vitals:   01/23/18 1645  TempSrc:   PainSc: Asleep                 Cecile HearingStephen Edward Mosella Kasa

## 2018-01-26 ENCOUNTER — Encounter (HOSPITAL_COMMUNITY): Payer: Self-pay | Admitting: General Surgery

## 2019-05-27 IMAGING — CR DG CHEST 2V
2 series · 2 of 2 positions shown · non-contrast
Comparison: None.

CLINICAL DATA: Preoperative evaluation, double hernia.

EXAM:
CHEST - 2 VIEW

[w chest pa]
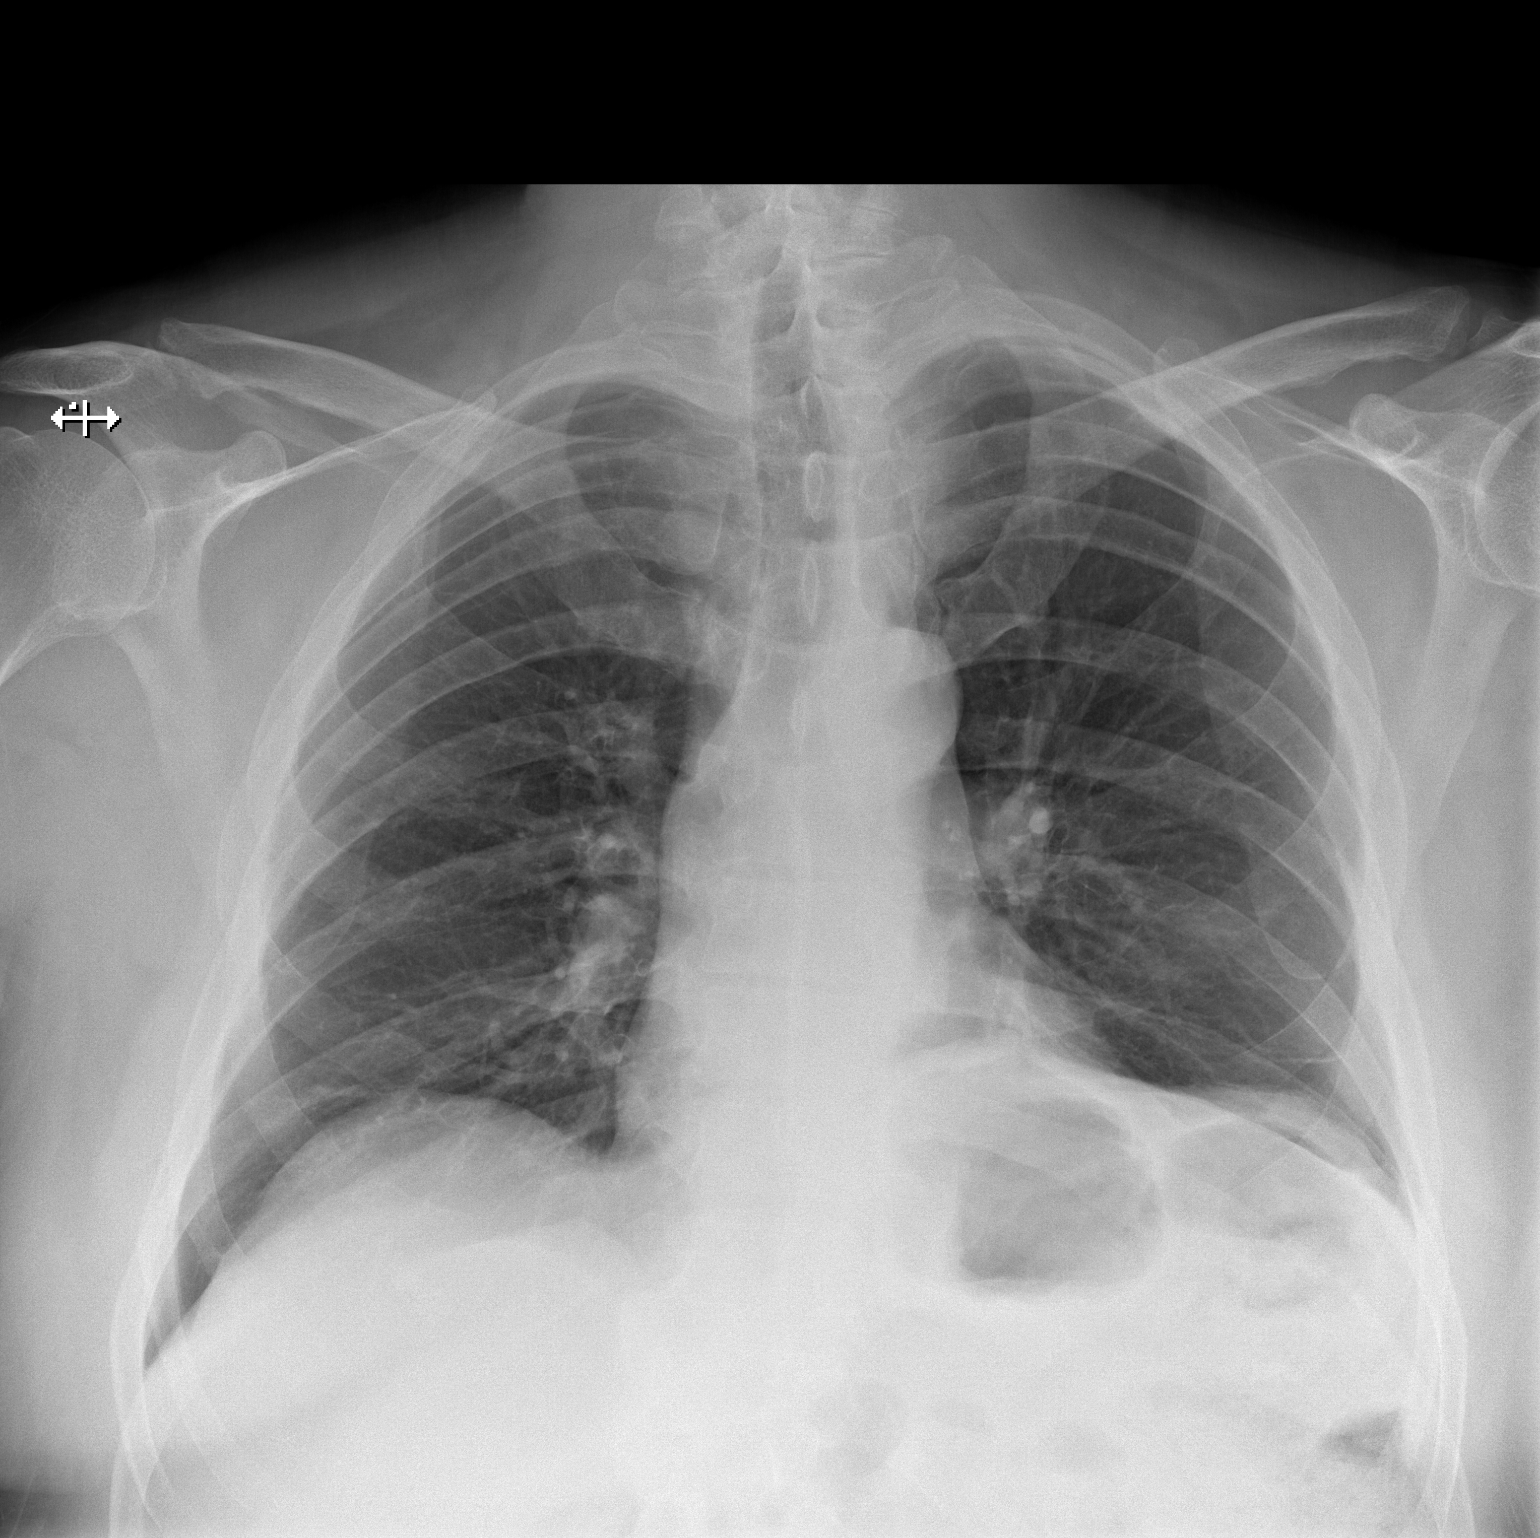

[w chest lat]
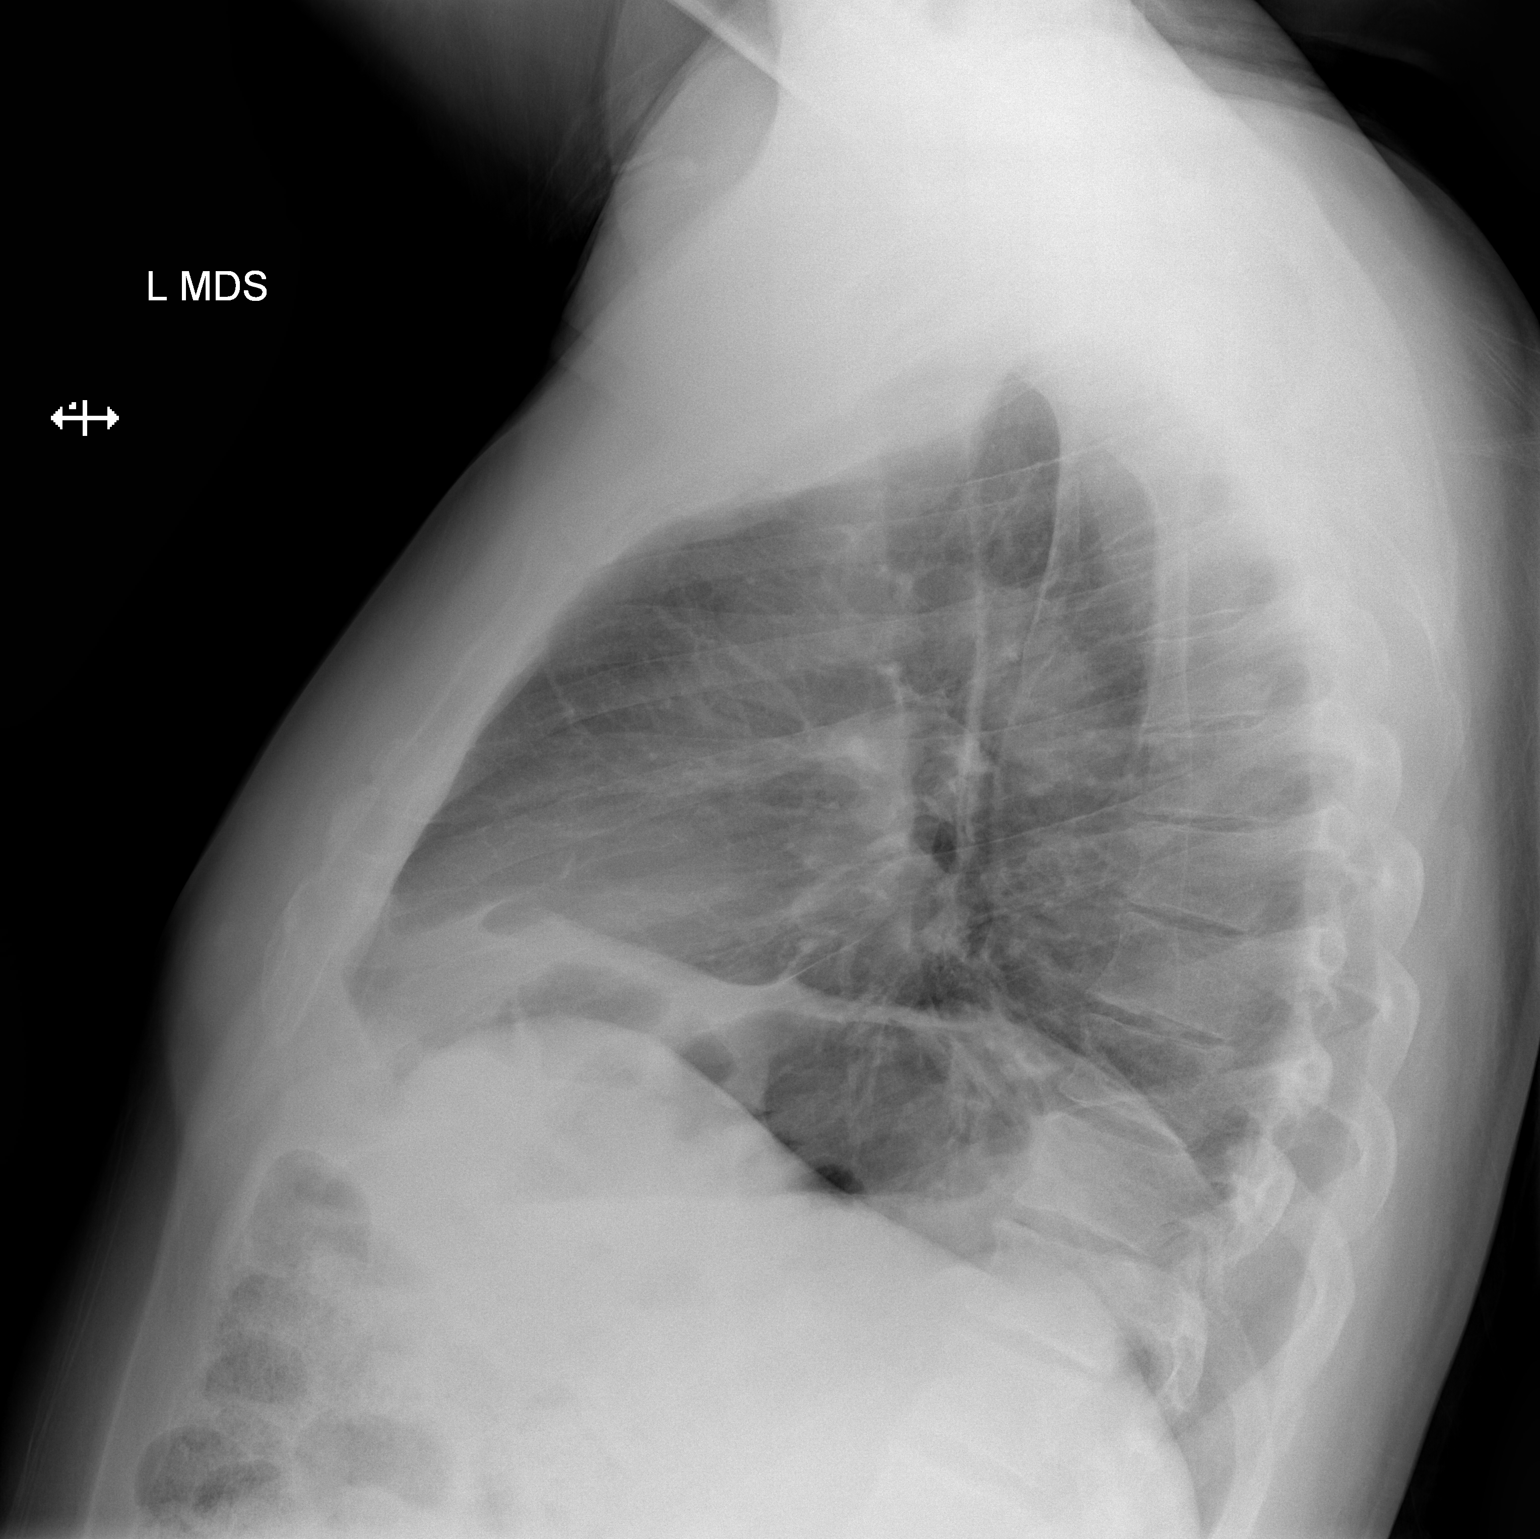

[2 of 2 positions shown; findings below may reference images not displayed]

FINDINGS: Cardiomediastinal silhouette is normal. Mildly elevated LEFT
hemidiaphragm with bibasilar strandy densities. No pleural effusion
or focal consolidation. No pneumothorax. Soft tissue planes and
included osseous structures are nonsuspicious. Mild degenerative
change of the thoracic spine.
IMPRESSION: Bibasilar atelectasis/scarring.

## 2023-06-09 DIAGNOSIS — E1122 Type 2 diabetes mellitus with diabetic chronic kidney disease: Secondary | ICD-10-CM | POA: Insufficient documentation

## 2023-06-09 DIAGNOSIS — N185 Chronic kidney disease, stage 5: Secondary | ICD-10-CM | POA: Insufficient documentation

## 2023-07-14 DIAGNOSIS — N2581 Secondary hyperparathyroidism of renal origin: Secondary | ICD-10-CM | POA: Insufficient documentation

## 2023-08-14 ENCOUNTER — Inpatient Hospital Stay (HOSPITAL_COMMUNITY)
Admission: EM | Admit: 2023-08-14 | Discharge: 2023-08-21 | DRG: 673 | Disposition: A | Discharge status: Skilled Nursing Facility | Payer: Medicare Other | Attending: Internal Medicine | Admitting: Internal Medicine

## 2023-08-14 ENCOUNTER — Other Ambulatory Visit: Payer: Self-pay

## 2023-08-14 ENCOUNTER — Encounter (HOSPITAL_COMMUNITY): Payer: Self-pay | Admitting: Emergency Medicine

## 2023-08-14 ENCOUNTER — Emergency Department (HOSPITAL_COMMUNITY): Payer: Medicare Other

## 2023-08-14 DIAGNOSIS — E872 Acidosis, unspecified: Secondary | ICD-10-CM | POA: Diagnosis present

## 2023-08-14 DIAGNOSIS — R531 Weakness: Secondary | ICD-10-CM | POA: Diagnosis present

## 2023-08-14 DIAGNOSIS — N19 Unspecified kidney failure: Secondary | ICD-10-CM | POA: Diagnosis present

## 2023-08-14 DIAGNOSIS — N186 End stage renal disease: Secondary | ICD-10-CM | POA: Diagnosis present

## 2023-08-14 DIAGNOSIS — N185 Chronic kidney disease, stage 5: Secondary | ICD-10-CM | POA: Diagnosis not present

## 2023-08-14 DIAGNOSIS — E871 Hypo-osmolality and hyponatremia: Secondary | ICD-10-CM | POA: Diagnosis present

## 2023-08-14 DIAGNOSIS — N179 Acute kidney failure, unspecified: Secondary | ICD-10-CM

## 2023-08-14 DIAGNOSIS — J181 Lobar pneumonia, unspecified organism: Secondary | ICD-10-CM | POA: Diagnosis not present

## 2023-08-14 DIAGNOSIS — E782 Mixed hyperlipidemia: Secondary | ICD-10-CM | POA: Diagnosis present

## 2023-08-14 DIAGNOSIS — E1165 Type 2 diabetes mellitus with hyperglycemia: Secondary | ICD-10-CM | POA: Diagnosis present

## 2023-08-14 DIAGNOSIS — Z751 Person awaiting admission to adequate facility elsewhere: Secondary | ICD-10-CM | POA: Diagnosis not present

## 2023-08-14 DIAGNOSIS — Z1152 Encounter for screening for COVID-19: Secondary | ICD-10-CM | POA: Diagnosis not present

## 2023-08-14 DIAGNOSIS — Y95 Nosocomial condition: Secondary | ICD-10-CM | POA: Diagnosis not present

## 2023-08-14 DIAGNOSIS — E1122 Type 2 diabetes mellitus with diabetic chronic kidney disease: Secondary | ICD-10-CM | POA: Diagnosis present

## 2023-08-14 DIAGNOSIS — E8770 Fluid overload, unspecified: Secondary | ICD-10-CM | POA: Diagnosis present

## 2023-08-14 DIAGNOSIS — R319 Hematuria, unspecified: Secondary | ICD-10-CM | POA: Diagnosis not present

## 2023-08-14 DIAGNOSIS — N3001 Acute cystitis with hematuria: Secondary | ICD-10-CM | POA: Diagnosis not present

## 2023-08-14 DIAGNOSIS — A419 Sepsis, unspecified organism: Secondary | ICD-10-CM | POA: Diagnosis not present

## 2023-08-14 DIAGNOSIS — D631 Anemia in chronic kidney disease: Secondary | ICD-10-CM | POA: Diagnosis present

## 2023-08-14 DIAGNOSIS — E785 Hyperlipidemia, unspecified: Secondary | ICD-10-CM | POA: Diagnosis present

## 2023-08-14 DIAGNOSIS — H409 Unspecified glaucoma: Secondary | ICD-10-CM | POA: Diagnosis present

## 2023-08-14 DIAGNOSIS — N400 Enlarged prostate without lower urinary tract symptoms: Secondary | ICD-10-CM | POA: Diagnosis present

## 2023-08-14 DIAGNOSIS — J189 Pneumonia, unspecified organism: Secondary | ICD-10-CM | POA: Diagnosis not present

## 2023-08-14 DIAGNOSIS — I120 Hypertensive chronic kidney disease with stage 5 chronic kidney disease or end stage renal disease: Secondary | ICD-10-CM | POA: Diagnosis present

## 2023-08-14 DIAGNOSIS — W06XXXA Fall from bed, initial encounter: Secondary | ICD-10-CM | POA: Diagnosis present

## 2023-08-14 DIAGNOSIS — E440 Moderate protein-calorie malnutrition: Secondary | ICD-10-CM | POA: Insufficient documentation

## 2023-08-14 DIAGNOSIS — A4152 Sepsis due to Pseudomonas: Secondary | ICD-10-CM | POA: Diagnosis not present

## 2023-08-14 DIAGNOSIS — R6520 Severe sepsis without septic shock: Secondary | ICD-10-CM | POA: Diagnosis not present

## 2023-08-14 DIAGNOSIS — Z794 Long term (current) use of insulin: Secondary | ICD-10-CM

## 2023-08-14 DIAGNOSIS — E119 Type 2 diabetes mellitus without complications: Secondary | ICD-10-CM

## 2023-08-14 DIAGNOSIS — D696 Thrombocytopenia, unspecified: Secondary | ICD-10-CM | POA: Diagnosis not present

## 2023-08-14 DIAGNOSIS — E538 Deficiency of other specified B group vitamins: Secondary | ICD-10-CM | POA: Diagnosis present

## 2023-08-14 DIAGNOSIS — N200 Calculus of kidney: Secondary | ICD-10-CM | POA: Diagnosis present

## 2023-08-14 DIAGNOSIS — Z79899 Other long term (current) drug therapy: Secondary | ICD-10-CM

## 2023-08-14 DIAGNOSIS — R7401 Elevation of levels of liver transaminase levels: Secondary | ICD-10-CM | POA: Diagnosis not present

## 2023-08-14 DIAGNOSIS — D6959 Other secondary thrombocytopenia: Secondary | ICD-10-CM | POA: Diagnosis not present

## 2023-08-14 DIAGNOSIS — Z992 Dependence on renal dialysis: Secondary | ICD-10-CM | POA: Diagnosis not present

## 2023-08-14 DIAGNOSIS — I10 Essential (primary) hypertension: Secondary | ICD-10-CM | POA: Diagnosis present

## 2023-08-14 DIAGNOSIS — F419 Anxiety disorder, unspecified: Secondary | ICD-10-CM | POA: Diagnosis present

## 2023-08-14 DIAGNOSIS — N390 Urinary tract infection, site not specified: Secondary | ICD-10-CM | POA: Diagnosis not present

## 2023-08-14 LAB — CBC
HCT: 27 % — ABNORMAL LOW (ref 39.0–52.0)
Hemoglobin: 8.3 g/dL — ABNORMAL LOW (ref 13.0–17.0)
MCH: 28.2 pg (ref 26.0–34.0)
MCHC: 30.7 g/dL (ref 30.0–36.0)
MCV: 91.8 fL (ref 80.0–100.0)
Platelets: 184 10*3/uL (ref 150–400)
RBC: 2.94 MIL/uL — ABNORMAL LOW (ref 4.22–5.81)
RDW: 12.6 % (ref 11.5–15.5)
WBC: 6.6 10*3/uL (ref 4.0–10.5)
nRBC: 0 % (ref 0.0–0.2)

## 2023-08-14 LAB — URINALYSIS, ROUTINE W REFLEX MICROSCOPIC
Bacteria, UA: NONE SEEN
Bilirubin Urine: NEGATIVE
Glucose, UA: NEGATIVE mg/dL
Ketones, ur: NEGATIVE mg/dL
Nitrite: NEGATIVE
Protein, ur: NEGATIVE mg/dL
Specific Gravity, Urine: 1.008 (ref 1.005–1.030)
pH: 5 (ref 5.0–8.0)

## 2023-08-14 LAB — GLUCOSE, CAPILLARY
Glucose-Capillary: 157 mg/dL — ABNORMAL HIGH (ref 70–99)
Glucose-Capillary: 130 mg/dL — ABNORMAL HIGH (ref 70–99)

## 2023-08-14 LAB — COMPREHENSIVE METABOLIC PANEL
ALT: 16 U/L (ref 0–44)
AST: 16 U/L (ref 15–41)
Albumin: 3.6 g/dL (ref 3.5–5.0)
Alkaline Phosphatase: 117 U/L (ref 38–126)
Anion gap: 11 (ref 5–15)
BUN: 62 mg/dL — ABNORMAL HIGH (ref 8–23)
CO2: 18 mmol/L — ABNORMAL LOW (ref 22–32)
Calcium: 8.8 mg/dL — ABNORMAL LOW (ref 8.9–10.3)
Chloride: 105 mmol/L (ref 98–111)
Creatinine, Ser: 5.74 mg/dL — ABNORMAL HIGH (ref 0.61–1.24)
GFR, Estimated: 10 mL/min — ABNORMAL LOW (ref >60)
Glucose, Bld: 99 mg/dL (ref 70–99)
Potassium: 4.7 mmol/L (ref 3.5–5.1)
Sodium: 134 mmol/L — ABNORMAL LOW (ref 135–145)
Total Bilirubin: 0.5 mg/dL (ref 0.3–1.2)
Total Protein: 6.9 g/dL (ref 6.5–8.1)

## 2023-08-14 LAB — CBG MONITORING, ED: Glucose-Capillary: 80 mg/dL (ref 70–99)

## 2023-08-14 LAB — BRAIN NATRIURETIC PEPTIDE: B Natriuretic Peptide: 45 pg/mL (ref 0.0–100.0)

## 2023-08-14 MED ORDER — INSULIN ASPART 100 UNIT/ML IJ SOLN
0.0000 [IU] | Freq: Every day | INTRAMUSCULAR | Status: DC
  Administered 2023-08-18: 3 [IU] via SUBCUTANEOUS
  Administered 2023-08-19 – 2023-08-20 (×2): 2 [IU] via SUBCUTANEOUS

## 2023-08-14 MED ORDER — DILTIAZEM HCL ER BEADS 240 MG PO CP24
480.0000 mg | ORAL_CAPSULE | Freq: Every day | ORAL | Status: DC
  Administered 2023-08-14 – 2023-08-15 (×2): 480 mg via ORAL
  Filled 2023-08-14 (×8): qty 2

## 2023-08-14 MED ORDER — INSULIN ASPART 100 UNIT/ML IJ SOLN
0.0000 [IU] | Freq: Three times a day (TID) | INTRAMUSCULAR | Status: DC
  Administered 2023-08-14 – 2023-08-16 (×3): 2 [IU] via SUBCUTANEOUS
  Administered 2023-08-16: 1 [IU] via SUBCUTANEOUS
  Administered 2023-08-17: 2 [IU] via SUBCUTANEOUS
  Administered 2023-08-17: 1 [IU] via SUBCUTANEOUS
  Administered 2023-08-17: 3 [IU] via SUBCUTANEOUS
  Administered 2023-08-18: 1 [IU] via SUBCUTANEOUS
  Administered 2023-08-18: 2 [IU] via SUBCUTANEOUS
  Administered 2023-08-18: 1 [IU] via SUBCUTANEOUS
  Administered 2023-08-19: 3 [IU] via SUBCUTANEOUS

## 2023-08-14 MED ORDER — METOPROLOL TARTRATE 50 MG PO TABS
50.0000 mg | ORAL_TABLET | Freq: Every day | ORAL | Status: DC
  Administered 2023-08-15 – 2023-08-17 (×2): 50 mg via ORAL
  Filled 2023-08-14 (×3): qty 1

## 2023-08-14 MED ORDER — ACETAMINOPHEN 325 MG PO TABS
650.0000 mg | ORAL_TABLET | Freq: Four times a day (QID) | ORAL | Status: DC | PRN
  Administered 2023-08-14 – 2023-08-21 (×2): 650 mg via ORAL
  Filled 2023-08-14 (×2): qty 2

## 2023-08-14 MED ORDER — INSULIN GLARGINE-YFGN 100 UNIT/ML ~~LOC~~ SOLN
15.0000 [IU] | Freq: Every day | SUBCUTANEOUS | Status: DC
  Administered 2023-08-15 – 2023-08-19 (×5): 15 [IU] via SUBCUTANEOUS
  Filled 2023-08-14 (×7): qty 0.15

## 2023-08-14 MED ORDER — METOPROLOL TARTRATE 25 MG PO TABS
25.0000 mg | ORAL_TABLET | Freq: Every day | ORAL | Status: DC
  Administered 2023-08-14: 25 mg via ORAL
  Filled 2023-08-14 (×3): qty 1

## 2023-08-14 MED ORDER — CALCITRIOL 0.25 MCG PO CAPS
0.2500 ug | ORAL_CAPSULE | Freq: Every day | ORAL | Status: DC
  Administered 2023-08-14 – 2023-08-20 (×7): 0.25 ug via ORAL
  Filled 2023-08-14 (×7): qty 1

## 2023-08-14 MED ORDER — ACETAMINOPHEN 650 MG RE SUPP: 650.0000 mg | Freq: Four times a day (QID) | RECTAL | Status: DC | PRN

## 2023-08-14 MED ORDER — ATORVASTATIN CALCIUM 20 MG PO TABS
20.0000 mg | ORAL_TABLET | Freq: Every day | ORAL | Status: DC
  Administered 2023-08-14 – 2023-08-20 (×7): 20 mg via ORAL
  Filled 2023-08-14 (×7): qty 1

## 2023-08-14 MED ORDER — HYDRALAZINE HCL 20 MG/ML IJ SOLN: 10.0000 mg | INTRAMUSCULAR | Status: DC | PRN

## 2023-08-14 MED ORDER — NEPRO/CARBSTEADY PO LIQD
237.0000 mL | Freq: Two times a day (BID) | ORAL | Status: DC
  Administered 2023-08-15 – 2023-08-20 (×10): 237 mL via ORAL

## 2023-08-14 MED ORDER — DORZOLAMIDE HCL-TIMOLOL MAL 2-0.5 % OP SOLN
1.0000 [drp] | Freq: Two times a day (BID) | OPHTHALMIC | Status: DC
  Administered 2023-08-14 – 2023-08-20 (×12): 1 [drp] via OPHTHALMIC
  Filled 2023-08-14 (×3): qty 10

## 2023-08-14 MED ORDER — FUROSEMIDE 10 MG/ML IJ SOLN
80.0000 mg | Freq: Two times a day (BID) | INTRAMUSCULAR | Status: DC
  Administered 2023-08-14 – 2023-08-19 (×10): 80 mg via INTRAVENOUS
  Filled 2023-08-14 (×12): qty 8

## 2023-08-14 MED ORDER — SODIUM CHLORIDE 0.9% FLUSH
3.0000 mL | Freq: Two times a day (BID) | INTRAVENOUS | Status: DC
Start: 2023-08-14 — End: 2023-08-21
  Administered 2023-08-14 – 2023-08-20 (×12): 3 mL via INTRAVENOUS

## 2023-08-14 MED ORDER — TAMSULOSIN HCL 0.4 MG PO CAPS
0.4000 mg | ORAL_CAPSULE | Freq: Every day | ORAL | Status: DC
  Administered 2023-08-14 – 2023-08-20 (×7): 0.4 mg via ORAL
  Filled 2023-08-14 (×7): qty 1

## 2023-08-14 MED ORDER — HEPARIN SODIUM (PORCINE) 5000 UNIT/ML IJ SOLN
5000.0000 [IU] | Freq: Three times a day (TID) | INTRAMUSCULAR | Status: DC
Start: 2023-08-14 — End: 2023-08-21
  Administered 2023-08-14 – 2023-08-20 (×16): 5000 [IU] via SUBCUTANEOUS
  Filled 2023-08-14 (×18): qty 1

## 2023-08-14 NOTE — Progress Notes (Signed)
Loyce Dys, MS RD PA-C called and stated that pt needed to be at [facility] IR at 0800 on  08/15/2023 [facility] was set up by [facility] and all documentation except med necessity at 300 Med surg nursing station/

## 2023-08-14 NOTE — Consult Note (Signed)
[facility]  HISTORY AND PHYSICAL  Philip Benjamin is an 72 y.o. male.    Chief Complaint: weakness  HPI: Pt is a 53M with a PMH sig for CKD IV followed by Dr Ronn Melena, HTN, DM II who is now seen in consultation at the request of Dr. Jarvis Newcomer for eval and recs re: progressive CKD.    Pt presented this AM with weakness- tried to get up this AM, stood in the bathroom, and fell.  Came to ED.  Was found to have Cr of 5.7, up from baseline of 3.49 07/12/23.    CXR and EKG OK.  He tells me has had food aversions x 2-3 months.  Increased LE edema especially in the last month.  In this setting we are asked to see.  Long discussion today re: starting dialysis.  We went over risks/ benefits/ alternatives with him and his wife separately.  I discussed that we would have to place a tunneled HD catheter to start as he does not have access yet.  He was fairly noncommittal and did not give me a definitive answer.    PMH: Past Medical History:  Diagnosis Date   BPH (benign prostatic hyperplasia)    Chronic kidney disease    ckd stage 3   Diabetes mellitus without complication (HCC)    Hypertension    PSH: Past Surgical History:  Procedure Laterality Date   EYE SURGERY     INGUINAL HERNIA REPAIR Bilateral 01/23/2018   Procedure: BILATERAL OPEN INGUINAL HERNIA REPAIR WITH MESH;  Surgeon: Jimmye Norman, MD;  Location: [facility] OR;  Service: General;  Laterality: Bilateral;   INSERTION OF MESH Bilateral 01/23/2018   Procedure: INSERTION OF MESH;  Surgeon: Jimmye Norman, MD;  Location: [facility] OR;  Service: General;  Laterality: Bilateral;   VASECTOMY     ~1985    Past Medical History:  Diagnosis Date   BPH (benign prostatic hyperplasia)    Chronic kidney disease    ckd stage 3   Diabetes mellitus without complication (HCC)    Hypertension     Medications:  Scheduled:  (Not in a hospital admission)   ALLERGIES:  No Known Allergies  FAM HX: History reviewed. No pertinent family  history.  Social History:   reports that he has never smoked. He has never used smokeless tobacco. He reports that he does not drink alcohol and does not use drugs.  ROS: ROS: all other systems reviewed and are negative except as per HPI  Blood pressure (!) 149/76, pulse 72, temperature 98 F (36.7 C), temperature source Oral, resp. rate 20, height 6\' 1"  (1.854 m), weight 115.7 kg, SpO2 98%. PHYSICAL EXAM: Physical Exam GEN NAD, sitting in bed HEENT EOMI PERRL NECK + prominent JVP PULM coarse bibasilar CV RRR ABD soft EXT 3+ LE edema to mid- shin NEURO AAO x 3 nonfocfal SKIN no rashes MSK + some sarcopenia   Results for orders placed or performed during the hospital encounter of 08/14/23 (from the past 48 hour(s))  Urinalysis, Routine w reflex microscopic -Urine, Clean Catch     Status: Abnormal   Collection Time: 08/14/23  4:59 AM  Result Value Ref Range   Color, Urine STRAW (A) YELLOW   APPearance CLEAR CLEAR   Specific Gravity, Urine 1.008 1.005 - 1.030   pH 5.0 5.0 - 8.0   Glucose, UA NEGATIVE NEGATIVE mg/dL   Hgb urine dipstick SMALL (A) NEGATIVE   Bilirubin Urine NEGATIVE NEGATIVE   Ketones, ur NEGATIVE NEGATIVE mg/dL  Protein, ur NEGATIVE NEGATIVE mg/dL   Nitrite NEGATIVE NEGATIVE   Leukocytes,Ua TRACE (A) NEGATIVE   RBC / HPF 0-5 0 - 5 RBC/hpf   WBC, UA 11-20 0 - 5 WBC/hpf   Bacteria, UA NONE SEEN NONE SEEN   Squamous Epithelial / HPF 0-5 0 - 5 /HPF    Comment: Performed at [facility], [address] [place], [place] [address]  CBC     Status: Abnormal   Collection Time: 08/14/23  5:15 AM  Result Value Ref Range   WBC 6.6 4.0 - 10.5 K/uL   RBC 2.94 (L) 4.22 - 5.81 MIL/uL   Hemoglobin 8.3 (L) 13.0 - 17.0 g/dL   HCT 60.4 (L) 54.0 - 98.1 %   MCV 91.8 80.0 - 100.0 fL   MCH 28.2 26.0 - 34.0 pg   MCHC 30.7 30.0 - 36.0 g/dL   RDW 19.1 47.8 - 29.5 %   Platelets 184 150 - 400 K/uL   nRBC 0.0 0.0 - 0.2 %    Comment: Performed at [facility], [address], [place], [place] [address]  Comprehensive metabolic panel     Status: Abnormal   Collection Time: 08/14/23  5:15 AM  Result Value Ref Range   Sodium 134 (L) 135 - 145 mmol/L   Potassium 4.7 3.5 - 5.1 mmol/L   Chloride 105 98 - 111 mmol/L   CO2 18 (L) 22 - 32 mmol/L   Glucose, Bld 99 70 - 99 mg/dL    Comment: Glucose reference range applies only to samples taken after fasting for at least 8 hours.   BUN 62 (H) 8 - 23 mg/dL   Creatinine, Ser 8.65 (H) 0.61 - 1.24 mg/dL   Calcium 8.8 (L) 8.9 - 10.3 mg/dL   Total Protein 6.9 6.5 - 8.1 g/dL   Albumin 3.6 3.5 - 5.0 g/dL   AST 16 15 - 41 U/L   ALT 16 0 - 44 U/L   Alkaline Phosphatase 117 38 - 126 U/L   Total Bilirubin 0.5 0.3 - 1.2 mg/dL   GFR, Estimated 10 (L) >60 mL/min    Comment: (NOTE) Calculated using the CKD-EPI Creatinine Equation (2021)    Anion gap 11 5 - 15    Comment: Performed at [facility], [address] [place], [place] [address]  Brain natriuretic peptide     Status: None   Collection Time: 08/14/23  5:15 AM  Result Value Ref Range   B Natriuretic Peptide 45.0 0.0 - 100.0 pg/mL    Comment: Performed at [facility], [address], [place], [place] [address]  CBG monitoring, ED     Status: None   Collection Time: 08/14/23  7:17 AM  Result Value Ref Range   Glucose-Capillary 80 70 - 99 mg/dL    Comment: Glucose reference range applies only to samples taken after fasting for at least 8 hours.    DG Chest Port 1 View  Result Date: 08/14/2023 CLINICAL DATA:  Weakness.  Lower extremity edema. EXAM: PORTABLE CHEST 1 VIEW COMPARISON:  05/20/2023 FINDINGS: Stable cardiomediastinal contours. Low lung volumes. Eventration of the hemidiaphragms. Unchanged scarring and volume loss identified within the left base. No airspace consolidation, pleural effusions, or interstitial edema. No pneumothorax. IMPRESSION: Low lung volumes and left base scarring. No acute findings. Electronically Signed   By: Signa Kell M.D.   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  Anemia:  - will check iron stores and Hgb  3.  BMM  - will check pTH and Vit D  4.  DM: per primary  5.  Dispo: admitted  Bufford Buttner 08/14/2023, 10:46 AM

## 2023-08-14 NOTE — Progress Notes (Addendum)
Called daughter Wallace Cullens to update of am transport to IR at [facility] at 0600 08/15/2023 via [facility]

## 2023-08-14 NOTE — Evaluation (Signed)
Physical Therapy Evaluation Patient Details Name: Philip Benjamin MRN: [id number] DOB: 05/15/1951 Today's Date: 08/14/2023  History of Present Illness  Philip Benjamin is a 72 y.o. male with a history of HTN, T2DM, HLD, CKD who presented to the ED 10/24 with progressive leg swelling and weakness. He and his wife give history, several weeks of worsening tiredness/fatigue. He's been told he'd need to follow up with vascular surgery and consented to dialysis with outpatient nephrology, Dr. Wynelle Link. He is a limited historian, though he reports he's been eating and drinking less, this morning he was unable to get up due to weakness that was new. His daughter is an Charity fundraiser at this hospital she is not at bedside. He denies any pain currently. States he makes a lot of urine with the lasix. Has no pains to speak of, no dyspnea currently.      He was found to have progression of advanced CKD on labs, appeared volume overloaded, so nephrology was consulted to initiate dialysis and the patient was admitted to [facility].   Clinical Impression  Patient demonstrates slightly labored movement fair/good carryover for sitting up at bedside with HOB flat, unable to stand without AD due to BLE weakness and required use of RW for sit to stands and transferring to chair.  Patient limited to a few steps forward/backwards at bedside before having to sit due to c/o fatigue and weakness.  Patient tolerated sitting up in chair after therapy with his brother present.  Patient will benefit from continued skilled physical therapy in hospital and recommended venue below to increase strength, balance, endurance for safe ADLs and gait.          If plan is discharge home, recommend the following: A lot of help with walking and/or transfers;A lot of help with bathing/dressing/bathroom;Help with stairs or ramp for entrance;Assistance with cooking/housework   Can travel by private vehicle   Yes    Equipment Recommendations None recommended  by PT  Recommendations for Other Services       Functional Status Assessment Patient has had a recent decline in their functional status and demonstrates the ability to make significant improvements in function in a reasonable and predictable amount of time.     Precautions / Restrictions Precautions Precautions: Fall Restrictions Weight Bearing Restrictions: No      Mobility  Bed Mobility Overal bed mobility: Needs Assistance Bed Mobility: Supine to Sit     Supine to sit: Supervision, Contact guard     General bed mobility comments: slightly labored movement with HOB flat    Transfers Overall transfer level: Needs assistance Equipment used: Rolling walker (2 wheels) Transfers: Sit to/from Stand, Bed to chair/wheelchair/BSC Sit to Stand: Mod assist   Step pivot transfers: Min assist, Mod assist       General transfer comment: Patient unable to stand without AD due to BLE weakness, required use of RW  for safety    Ambulation/Gait Ambulation/Gait assistance: Mod assist Gait Distance (Feet): 8 Feet Assistive device: Rolling walker (2 wheels) Gait Pattern/deviations: Decreased step length - right, Decreased step length - left, Decreased stride length, Trunk flexed Gait velocity: slow     General Gait Details: limited to a few steps forward/backwards before having to sit due to fatigue and generalized weakness  Stairs            Wheelchair Mobility     Tilt Bed    Modified Rankin (Stroke Patients Only)       Balance Overall balance assessment:  Needs assistance Sitting-balance support: Feet supported, No upper extremity supported Sitting balance-Leahy Scale: Fair Sitting balance - Comments: fair/good seated at EOB   Standing balance support: During functional activity, No upper extremity supported Standing balance-Leahy Scale: Poor Standing balance comment: fair/poor using RW                             Pertinent Vitals/Pain Pain  Assessment Pain Assessment: No/denies pain    Home Living Family/patient expects to be discharged to:: Private residence Living Arrangements: Spouse/significant other Available Help at Discharge: Family;Available PRN/intermittently Type of Home: House Home Access: Stairs to enter Entrance Stairs-Rails: None Entrance Stairs-Number of Steps: 5 Alternate Level Stairs-Number of Steps: 5 Home Layout: Two level;Laundry or work area in basement;Able to live on main level with bedroom/bathroom;Full bath on main level Home Equipment: Rolling Walker (2 wheels);Shower seat;Hand held shower head      Prior Function Prior Level of Function : Independent/Modified Independent             Mobility Comments: Household and short distanced community ambulation without AD ADLs Comments: Independent     Extremity/Trunk Assessment   Upper Extremity Assessment Upper Extremity Assessment: Defer to OT evaluation    Lower Extremity Assessment Lower Extremity Assessment: Generalized weakness    Cervical / Trunk Assessment Cervical / Trunk Assessment: Normal  Communication   Communication Communication: No apparent difficulties  Cognition Arousal: Alert Behavior During Therapy: WFL for tasks assessed/performed Overall Cognitive Status: Within Functional Limits for tasks assessed                                          General Comments      Exercises     Assessment/Plan    PT Assessment Patient needs continued PT services  PT Problem List Decreased strength;Decreased activity tolerance;Decreased balance;Decreased mobility       PT Treatment Interventions DME instruction;Gait training;Stair training;Functional mobility training;Therapeutic activities;Therapeutic exercise;Balance training;Patient/family education    PT Goals (Current goals can be found in the Care Plan section)  Acute Rehab PT Goals Patient Stated Goal: return home with family to assist PT Goal  Formulation: With patient/family Time For Goal Achievement: 08/28/23 Potential to Achieve Goals: Good    Frequency Min 3X/week     Co-evaluation               AM-PAC PT "6 Clicks" Mobility  Outcome Measure Help needed turning from your back to your side while in a flat bed without using bedrails?: None Help needed moving from lying on your back to sitting on the side of a flat bed without using bedrails?: A Little Help needed moving to and from a bed to a chair (including a wheelchair)?: A Lot Help needed standing up from a chair using your arms (e.g., wheelchair or bedside chair)?: A Lot Help needed to walk in hospital room?: A Lot Help needed climbing 3-5 steps with a railing? : A Lot 6 Click Score: 15    End of Session   Activity Tolerance: Patient tolerated treatment well;Patient limited by fatigue Patient left: in chair;with call bell/phone within reach;with family/visitor present Nurse Communication: Mobility status PT Visit Diagnosis: Unsteadiness on feet (R26.81);Other abnormalities of gait and mobility (R26.89);Muscle weakness (generalized) (M62.81)    Time: 6578-4696 PT Time Calculation (min) (ACUTE ONLY): 31 min   Charges:   PT  Evaluation $PT Eval Moderate Complexity: 1 Mod PT Treatments $Therapeutic Activity: 23-37 mins PT General Charges $$ ACUTE PT VISIT: 1 Visit         3:26 PM, 08/14/23 Ocie Bob, MPT Physical Therapist with [facility] [phone] office [phone] mobile phone

## 2023-08-14 NOTE — H&P (Signed)
History and Physical    Patient: Philip Benjamin WUJ:[id number] DOB: May 04, 1951 DOA: 08/14/2023 DOS: the patient was seen and examined on 08/14/2023 PCP: Kirstie Peri, MD  Patient coming from: Home  Chief Complaint:  Chief Complaint  Patient presents with   Weakness   HPI: Philip Benjamin is a 72 y.o. male with a history of HTN, T2DM, HLD, CKD who presented to the ED 10/24 with progressive leg swelling and weakness. He and his wife give history, several weeks of worsening tiredness/fatigue. He's been told he'd need to follow up with vascular surgery and consented to dialysis with outpatient nephrology, Dr. Wynelle Link. He is a limited historian, though he reports he's been eating and drinking less, this morning he was unable to get up due to weakness that was new. His daughter is an Charity fundraiser at this hospital she is not at bedside. He denies any pain currently. States he makes a lot of urine with the lasix. Has no pains to speak of, no dyspnea currently.   He was found to have progression of advanced CKD on labs, appeared volume overloaded, so nephrology was consulted to initiate dialysis and the patient was admitted to [facility].   Review of Systems: As mentioned in the history of present illness. All other systems reviewed and are negative. Past Medical History:  Diagnosis Date   BPH (benign prostatic hyperplasia)    Chronic kidney disease    ckd stage 3   Diabetes mellitus without complication (HCC)    Hypertension    Past Surgical History:  Procedure Laterality Date   EYE SURGERY     INGUINAL HERNIA REPAIR Bilateral 01/23/2018   Procedure: BILATERAL OPEN INGUINAL HERNIA REPAIR WITH MESH;  Surgeon: Jimmye Norman, MD;  Location: [facility] OR;  Service: General;  Laterality: Bilateral;   INSERTION OF MESH Bilateral 01/23/2018   Procedure: INSERTION OF MESH;  Surgeon: Jimmye Norman, MD;  Location: [facility] OR;  Service: General;  Laterality: Bilateral;   VASECTOMY     ~1985   Social History:  reports that he has  never smoked. He has never used smokeless tobacco. He reports that he does not drink alcohol and does not use drugs.  No Known Allergies  History reviewed. No pertinent family history.  Prior to Admission medications   Medication Sig Start Date End Date Taking? Authorizing Provider  atorvastatin (LIPITOR) 20 MG tablet Take 20 mg by mouth daily. 01/02/18  Yes [provider]  calcitRIOL (ROCALTROL) 0.25 MCG capsule Take by mouth. 08/11/23 08/10/24 Yes [provider]  Cholecalciferol 125 MCG (5000 UT) capsule Take by mouth.   Yes [provider]  cycloSPORINE (RESTASIS) 0.05 % ophthalmic emulsion SMARTSIG:In Eye(s) 08/13/23  Yes [provider]  diltiazem (TIAZAC) 240 MG 24 hr capsule Take 480 mg by mouth daily. 12/22/17  Yes [provider]  dorzolamide-timolol (COSOPT) 22.3-6.8 MG/ML ophthalmic solution Place 1 drop into both eyes 2 (two) times daily. 12/16/17  Yes [provider]  furosemide (LASIX) 20 MG tablet Take 40 mg by mouth daily. 01/02/18  Yes [provider]  LANTUS 100 UNIT/ML injection Inject 50 Units into the skin at bedtime. 01/02/18  Yes [provider]  metoprolol tartrate (LOPRESSOR) 25 MG tablet Take 25 mg by mouth daily. 12/16/17  Yes [provider]  baclofen (LIORESAL) 10 MG tablet Take 10 mg by mouth daily as needed. For shoulder pain. Patient not taking: Reported on 08/14/2023 10/10/17   [provider]  Lifitegrast Benay Spice) 5 % SOLN Place 1  drop into both eyes every other day as needed (for dry eyes.). Benay Spice    [provider]  losartan (COZAAR) 100 MG tablet Take 100 mg by mouth daily. 10/27/17   [provider]  nystatin cream (MYCOSTATIN) Apply 1 application topically 2 (two) times daily as needed. FOR YEAST OR SKIN RASHES. 12/16/17   [provider]  oxyCODONE (OXY IR/ROXICODONE) 5 MG immediate release tablet Take 1-2 tablets (5-10 mg total) by mouth every 6  (six) hours as needed for severe pain. Patient not taking: Reported on 08/14/2023 01/23/18   Jimmye Norman, MD  tamsulosin (FLOMAX) 0.4 MG CAPS capsule Take 0.4 mg by mouth daily with supper. 12/16/17   [provider]  tamsulosin (FLOMAX) 0.4 MG CAPS capsule Take 1 capsule by mouth daily.    [provider]    Physical Exam: Vitals:   08/14/23 1015 08/14/23 1030 08/14/23 1045 08/14/23 1141  BP: 131/72 (!) 145/96 (!) 144/84 (!) 152/79  Pulse:    71  Resp: 18 (!) 35 12 14  Temp:    98.4 F (36.9 C)  TempSrc:    Oral  SpO2:    100%  Weight:    99.3 kg  Height:    6\' 1"  (1.854 m)  Gen: No distress Pulm: Clear, nonlabored  CV: RRR, no MRG 2+ pitting LE edema, +JVD GI: Soft, NT, ND, +BS  Neuro: Alert and oriented. No new focal deficits. Ext: Warm, no deformities. Decreased muscle bulk.  Skin: No rashes, lesions or ulcers on visualized skin   Data Reviewed: SCr 5.74, BUN 62, K 4.7, Na 134, bicarb 18. LFTs wnl. BNP 45. Hgb 8.3g/dl, indices normal.  UA: 40-98 WBCs (denied dysuria), +dipstick small hgb but no RBCs or bacteria, negative dipstick protein.  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 No availability for surgery placement of TDC at [facility], so IR consulted. Will make NPO p MN for [facility] placement at [facility] 10/25.  - Attempt diuresis with IV lasix - Hold nephrotoxins, with K near ULN, will not restart ARB at this time.  - Continue calcitriol  HTN:  - Continue metoprolol, diltiazem  T2DM:  - SSI, will restart lower basal dose of glargine  Anemia of CKD:  - Anemia panel in AM  NAGMA:  - Bicarb tabs if worsens  HLD:  - Continue statin  BPH:  - Continue tamsulosin  Glaucoma:  - Continue gtt's  Weakness:  - PT/OT   Advance Care Planning: Full code  Consults: Nephrology, surgery, IR  Family Communication: Spouse at bedside  Severity of Illness: The appropriate patient status for this  patient is INPATIENT. Inpatient status is judged to be reasonable and necessary in order to provide the required intensity of service to ensure the patient's safety. The patient's presenting symptoms, physical exam findings, and initial radiographic and laboratory data in the context of their chronic comorbidities is felt to place them at high risk for further clinical deterioration. Furthermore, it is not anticipated that the patient will be medically stable for discharge from the hospital within 2 midnights of admission.   * I certify that at the point of admission it is my clinical judgment that the patient will require inpatient hospital care spanning beyond 2 midnights from the point of admission due to high intensity of service, high risk for further deterioration and high frequency of surveillance required.*  Author: Tyrone Nine, MD 08/14/2023 12:43 PM  For on  call review [email or web address].

## 2023-08-14 NOTE — ED Provider Notes (Signed)
[facility]-EMERGENCY DEPT [facility] Emergency Department Provider Note MRN:  [id number]  Arrival date & time: 08/14/23     Chief Complaint   Weakness   History of Present Illness   Philip Benjamin is a 72 y.o. year-old male with a history of diabetes, hypertension, CKD presenting to the ED with chief complaint of missed.  Generalized weakness, trouble ambulating for the past few days.  He explains he was sent here because his daughter thinks he is going into renal failure.  Has been having lower extremity swelling for the past several weeks.  Denies any pain.  Review of Systems  A thorough review of systems was obtained and all systems are negative except as noted in the HPI and PMH.   Patient's Health History    Past Medical History:  Diagnosis Date   BPH (benign prostatic hyperplasia)    Chronic kidney disease    ckd stage 3   Diabetes mellitus without complication (HCC)    Hypertension     Past Surgical History:  Procedure Laterality Date   EYE SURGERY     INGUINAL HERNIA REPAIR Bilateral 01/23/2018   Procedure: BILATERAL OPEN INGUINAL HERNIA REPAIR WITH MESH;  Surgeon: Jimmye Norman, MD;  Location: [facility] OR;  Service: General;  Laterality: Bilateral;   INSERTION OF MESH Bilateral 01/23/2018   Procedure: INSERTION OF MESH;  Surgeon: Jimmye Norman, MD;  Location: [facility] OR;  Service: General;  Laterality: Bilateral;   VASECTOMY     ~1985    History reviewed. No pertinent family history.  Social History   Socioeconomic History   Marital status: Married    Spouse name: Not on file   Number of children: Not on file   Years of education: Not on file   Highest education level: Not on file  Occupational History   Not on file  Tobacco Use   Smoking status: Never   Smokeless tobacco: Never  Substance and Sexual Activity   Alcohol use: Never   Drug use: Never   Sexual activity: Not on file  Other Topics Concern   Not on file  Social History Narrative   Not on file   Social  Determinants of Health   Financial Resource Strain: Not on file  Food Insecurity: Not on file  Transportation Needs: Not on file  Physical Activity: Not on file  Stress: Not on file  Social Connections: Not on file  Intimate Partner Violence: Not on file     Physical Exam   Vitals:   08/14/23 0630 08/14/23 0645  BP: 135/67 119/79  Pulse: 65 72  Resp: 19 14  Temp:    SpO2: 98% 98%    CONSTITUTIONAL: Well-appearing, NAD NEURO/PSYCH:  Alert and oriented x 3, no focal deficits EYES:  eyes equal and reactive ENT/NECK:  no LAD, no JVD CARDIO: Regular rate, well-perfused, normal S1 and S2 PULM:  CTAB no wheezing or rhonchi GI/GU:  non-distended, non-tender MSK/SPINE:  No gross deformities, 2+ pitting edema to the bilateral lower extremities SKIN:  no rash, atraumatic   *Additional and/or pertinent findings included in MDM below  Diagnostic and Interventional Summary    EKG Interpretation Date/Time:  Thursday August 14 2023 04:59:01 EDT Ventricular Rate:  64 PR Interval:  59 QRS Duration:  116 QT Interval:  387 QTC Calculation: 400 R Axis:   58  Text Interpretation: Sinus rhythm Short PR interval Probable left atrial enlargement Incomplete left bundle branch block Artifact in lead(s) I II III aVR aVL aVF V1 V2  V3 Confirmed by Kennis Carina [phone]) on 08/14/2023 5:26:25 AM       Labs Reviewed  CBC - Abnormal; Notable for the following components:      Result Value   RBC 2.94 (*)    Hemoglobin 8.3 (*)    HCT 27.0 (*)    All other components within normal limits  COMPREHENSIVE METABOLIC PANEL - Abnormal; Notable for the following components:   Sodium 134 (*)    CO2 18 (*)    BUN 62 (*)    Creatinine, Ser 5.74 (*)    Calcium 8.8 (*)    GFR, Estimated 10 (*)    All other components within normal limits  URINALYSIS, ROUTINE W REFLEX MICROSCOPIC - Abnormal; Notable for the following components:   Color, Urine STRAW (*)    Hgb urine dipstick SMALL (*)     Leukocytes,Ua TRACE (*)    All other components within normal limits  BRAIN NATRIURETIC PEPTIDE    DG Chest Port 1 View  Final Result      Medications - No data to display   Procedures  /  Critical Care .Critical Care  Performed by: Sabas Sous, MD Authorized by: Sabas Sous, MD   Critical care provider statement:    Critical care time (minutes):  35   Critical care was necessary to treat or prevent imminent or life-threatening deterioration of the following conditions:  Renal failure   Critical care was time spent personally by me on the following activities:  Development of treatment plan with patient or surrogate, discussions with consultants, evaluation of patient's response to treatment, examination of patient, ordering and review of laboratory studies, ordering and review of radiographic studies, ordering and performing treatments and interventions, pulse oximetry, re-evaluation of patient's condition and review of old charts   ED Course and Medical Decision Making  Initial Impression and Ddx Differential diagnosis includes CHF, renal failure, cirrhosis, electrolyte disturbance  Past medical/surgical history that increases complexity of ED encounter: CKD  Interpretation of Diagnostics I personally reviewed the EKG and my interpretation is as follows: Sinus rhythm  Labs reveal worsening renal function.  Patient Reassessment and Ultimate Disposition/Management     Discussed case with nephrology, admitted to medicine for further care.  Patient management required discussion with the following services or consulting groups:  Hospitalist Service and Nephrology  Complexity of Problems Addressed Acute illness or injury that poses threat of life of bodily function  Additional Data Reviewed and Analyzed Further history obtained from: Further history from spouse/family member  Additional Factors Impacting ED Encounter Risk Consideration of hospitalization  Elmer Sow. Pilar Plate, MD [facility] Emergency Medicine [facility] [email or web address]  Final Clinical Impressions(s) / ED Diagnoses     ICD-10-CM   1. Renal failure, unspecified chronicity  N19       ED Discharge Orders     None        Discharge Instructions Discussed with and Provided to Patient:   Discharge Instructions   None      Sabas Sous, MD 08/14/23 [phone]

## 2023-08-14 NOTE — ED Notes (Signed)
EDP at bedside for MSE during triage

## 2023-08-14 NOTE — Progress Notes (Signed)
Interventional Radiology Brief Note:  Spoke with Dr. Jarvis Newcomer to confirm IR to place tunneled HD catheter.  Spoke with RN to arrange [facility]transport to [facility].  Patient to arrive at 8am.   NPO p MN.   Formal consult and consent to be obtained upon arrival.   Per RN, patient's daughter involved in discussions with patient who consents for himself. Team aware to cancel order and transport if patient declines catheter.   Loyce Dys, MS RD PA-C

## 2023-08-14 NOTE — Progress Notes (Signed)
Philip Benjamin over: Worsening renal disease. They have been trying to manage in outpatient setting. A couple of months ago cr jumped from 3ish >> 5ish. He has had more edema, been fatigued, and had some general weakness. He slipped out of bed tonight. Nephro consulted from ED and advised admission. Possibly start dialysis?

## 2023-08-14 NOTE — Plan of Care (Signed)
  Problem: Acute Rehab PT Goals(only PT should resolve) Goal: Pt Will Go Supine/Side To Sit Outcome: Progressing Flowsheets (Taken 08/14/2023 1528) Pt will go Supine/Side to Sit:  with modified independence  with supervision Goal: Patient Will Transfer Sit To/From Stand Outcome: Progressing Flowsheets (Taken 08/14/2023 1528) Patient will transfer sit to/from stand:  with contact guard assist  with minimal assist Goal: Pt Will Transfer Bed To Chair/Chair To Bed Outcome: Progressing Flowsheets (Taken 08/14/2023 1528) Pt will Transfer Bed to Chair/Chair to Bed:  with contact guard assist  with min assist Goal: Pt Will Ambulate Outcome: Progressing Flowsheets (Taken 08/14/2023 1528) Pt will Ambulate:  50 feet  with contact guard assist  with minimal assist  with rolling walker   3:29 PM, 08/14/23 Ocie Bob, MPT Physical Therapist with [facility] [phone] office [phone] mobile phone

## 2023-08-14 NOTE — ED Triage Notes (Signed)
Pt BIB [facility] after slipping into the floor from the bed, family reports weakness x months worsening lately, bilateral leg swelling with leg weakness, hx of stage IV renal failure, denies LOC and hitting head, BP 146/66, HR 75, 97% RA, RR 16, CBG 110

## 2023-08-15 ENCOUNTER — Ambulatory Visit (HOSPITAL_COMMUNITY)
Admission: RE | Admit: 2023-08-15 | Discharge: 2023-08-15 | Disposition: A | Discharge status: Home / Self Care | Payer: Medicare Other | Source: Ambulatory Visit | Attending: Family Medicine | Admitting: Family Medicine

## 2023-08-15 DIAGNOSIS — E785 Hyperlipidemia, unspecified: Secondary | ICD-10-CM | POA: Insufficient documentation

## 2023-08-15 DIAGNOSIS — E1122 Type 2 diabetes mellitus with diabetic chronic kidney disease: Secondary | ICD-10-CM | POA: Insufficient documentation

## 2023-08-15 DIAGNOSIS — N179 Acute kidney failure, unspecified: Secondary | ICD-10-CM | POA: Diagnosis not present

## 2023-08-15 DIAGNOSIS — I120 Hypertensive chronic kidney disease with stage 5 chronic kidney disease or end stage renal disease: Secondary | ICD-10-CM | POA: Insufficient documentation

## 2023-08-15 DIAGNOSIS — Z794 Long term (current) use of insulin: Secondary | ICD-10-CM | POA: Insufficient documentation

## 2023-08-15 DIAGNOSIS — N186 End stage renal disease: Secondary | ICD-10-CM | POA: Insufficient documentation

## 2023-08-15 DIAGNOSIS — Z992 Dependence on renal dialysis: Secondary | ICD-10-CM | POA: Insufficient documentation

## 2023-08-15 DIAGNOSIS — N185 Chronic kidney disease, stage 5: Secondary | ICD-10-CM | POA: Diagnosis not present

## 2023-08-15 HISTORY — PX: IR FLUORO GUIDE CV LINE RIGHT: IMG2283

## 2023-08-15 HISTORY — PX: IR US GUIDE VASC ACCESS RIGHT: IMG2390

## 2023-08-15 LAB — GLUCOSE, CAPILLARY
Glucose-Capillary: 71 mg/dL (ref 70–99)
Glucose-Capillary: 69 mg/dL — ABNORMAL LOW (ref 70–99)
Glucose-Capillary: 124 mg/dL — ABNORMAL HIGH (ref 70–99)
Glucose-Capillary: 98 mg/dL (ref 70–99)
Glucose-Capillary: 166 mg/dL — ABNORMAL HIGH (ref 70–99)
Glucose-Capillary: 110 mg/dL — ABNORMAL HIGH (ref 70–99)
Glucose-Capillary: 157 mg/dL — ABNORMAL HIGH (ref 70–99)

## 2023-08-15 LAB — RENAL FUNCTION PANEL
Albumin: 3.6 g/dL (ref 3.5–5.0)
Anion gap: 10 (ref 5–15)
BUN: 64 mg/dL — ABNORMAL HIGH (ref 8–23)
CO2: 19 mmol/L — ABNORMAL LOW (ref 22–32)
Calcium: 9 mg/dL (ref 8.9–10.3)
Chloride: 105 mmol/L (ref 98–111)
Creatinine, Ser: 5.75 mg/dL — ABNORMAL HIGH (ref 0.61–1.24)
GFR, Estimated: 10 mL/min — ABNORMAL LOW (ref >60)
Glucose, Bld: 70 mg/dL (ref 70–99)
Phosphorus: 6 mg/dL — ABNORMAL HIGH (ref 2.5–4.6)
Potassium: 4.6 mmol/L (ref 3.5–5.1)
Sodium: 134 mmol/L — ABNORMAL LOW (ref 135–145)

## 2023-08-15 LAB — CBC
HCT: 27 % — ABNORMAL LOW (ref 39.0–52.0)
HCT: 27.1 % — ABNORMAL LOW (ref 39.0–52.0)
Hemoglobin: 8.6 g/dL — ABNORMAL LOW (ref 13.0–17.0)
Hemoglobin: 8.6 g/dL — ABNORMAL LOW (ref 13.0–17.0)
MCH: 28.8 pg (ref 26.0–34.0)
MCH: 29.1 pg (ref 26.0–34.0)
MCHC: 31.9 g/dL (ref 30.0–36.0)
MCHC: 31.7 g/dL (ref 30.0–36.0)
MCV: 90.3 fL (ref 80.0–100.0)
MCV: 91.6 fL (ref 80.0–100.0)
Platelets: 192 10*3/uL (ref 150–400)
Platelets: 189 10*3/uL (ref 150–400)
RBC: 2.99 MIL/uL — ABNORMAL LOW (ref 4.22–5.81)
RBC: 2.96 MIL/uL — ABNORMAL LOW (ref 4.22–5.81)
RDW: 12.8 % (ref 11.5–15.5)
RDW: 12.9 % (ref 11.5–15.5)
WBC: 5.5 10*3/uL (ref 4.0–10.5)
WBC: 5.5 10*3/uL (ref 4.0–10.5)
nRBC: 0 % (ref 0.0–0.2)
nRBC: 0 % (ref 0.0–0.2)

## 2023-08-15 LAB — FERRITIN: Ferritin: 188 ng/mL (ref 24–336)

## 2023-08-15 LAB — RETICULOCYTES
Immature Retic Fract: 6.4 % (ref 2.3–15.9)
RBC.: 2.98 MIL/uL — ABNORMAL LOW (ref 4.22–5.81)
Retic Count, Absolute: 22.4 10*3/uL (ref 19.0–186.0)
Retic Ct Pct: 0.8 % (ref 0.4–3.1)

## 2023-08-15 LAB — IRON AND TIBC
Iron: 86 ug/dL (ref 45–182)
Saturation Ratios: 30 % (ref 17.9–39.5)
TIBC: 289 ug/dL (ref 250–450)
UIBC: 203 ug/dL

## 2023-08-15 LAB — FOLATE: Folate: 3.8 ng/mL — ABNORMAL LOW (ref >5.9)

## 2023-08-15 LAB — HEPATITIS B SURFACE ANTIGEN: Hepatitis B Surface Ag: NONREACTIVE

## 2023-08-15 LAB — HEMOGLOBIN A1C
Hgb A1c MFr Bld: 5.6 % (ref 4.8–5.6)
Mean Plasma Glucose: 114.02 mg/dL

## 2023-08-15 LAB — VITAMIN B12: Vitamin B-12: 275 pg/mL (ref 180–914)

## 2023-08-15 MED ORDER — CEFAZOLIN SODIUM-DEXTROSE 2-4 GM/100ML-% IV SOLN
INTRAVENOUS | Status: AC | PRN
  Administered 2023-08-15: 2 g via INTRAVENOUS

## 2023-08-15 MED ORDER — FOLIC ACID 1 MG PO TABS
1.0000 mg | ORAL_TABLET | Freq: Every day | ORAL | Status: DC
  Administered 2023-08-15 – 2023-08-20 (×6): 1 mg via ORAL
  Filled 2023-08-15 (×6): qty 1

## 2023-08-15 MED ORDER — HEPARIN SODIUM (PORCINE) 1000 UNIT/ML IJ SOLN
INTRAMUSCULAR | Status: AC
  Filled 2023-08-15: qty 4

## 2023-08-15 MED ORDER — CEFAZOLIN SODIUM-DEXTROSE 2-4 GM/100ML-% IV SOLN
INTRAVENOUS | Status: AC
  Filled 2023-08-15: qty 100

## 2023-08-15 MED ORDER — LIDOCAINE-EPINEPHRINE 1 %-1:100000 IJ SOLN
INTRAMUSCULAR | Status: AC
  Filled 2023-08-15: qty 1

## 2023-08-15 MED ORDER — LIDOCAINE-EPINEPHRINE (PF) 1 %-1:200000 IJ SOLN
20.0000 mL | Freq: Once | INTRAMUSCULAR | Status: AC
  Administered 2023-08-15: 20 mL

## 2023-08-15 MED ORDER — DEXTROSE 50 % IV SOLN
INTRAVENOUS | Status: AC
  Filled 2023-08-15: qty 50

## 2023-08-15 MED ORDER — CHLORHEXIDINE GLUCONATE CLOTH 2 % EX PADS
6.0000 | MEDICATED_PAD | Freq: Every day | CUTANEOUS | Status: DC
  Administered 2023-08-15 – 2023-08-20 (×6): 6 via TOPICAL

## 2023-08-15 MED ORDER — ALTEPLASE 2 MG IJ SOLR: 2.0000 mg | Freq: Once | INTRAMUSCULAR | Status: DC | PRN

## 2023-08-15 MED ORDER — FENTANYL CITRATE (PF) 100 MCG/2ML IJ SOLN
INTRAMUSCULAR | Status: AC
  Filled 2023-08-15: qty 2

## 2023-08-15 MED ORDER — HEPARIN SODIUM (PORCINE) 1000 UNIT/ML IJ SOLN: 3800.0000 [IU] | Freq: Once | INTRAMUSCULAR | Status: DC

## 2023-08-15 MED ORDER — DEXTROSE 50 % IV SOLN
12.5000 g | INTRAVENOUS | Status: AC
  Administered 2023-08-15: 12.5 g via INTRAVENOUS

## 2023-08-15 MED ORDER — RENA-VITE PO TABS
1.0000 | ORAL_TABLET | Freq: Every day | ORAL | Status: DC
  Administered 2023-08-15 – 2023-08-20 (×6): 1 via ORAL
  Filled 2023-08-15 (×6): qty 1

## 2023-08-15 MED ORDER — FENTANYL CITRATE (PF) 100 MCG/2ML IJ SOLN
INTRAMUSCULAR | Status: AC | PRN
  Administered 2023-08-15: 25 ug via INTRAVENOUS

## 2023-08-15 MED ORDER — DARBEPOETIN ALFA 60 MCG/0.3ML IJ SOSY
60.0000 ug | PREFILLED_SYRINGE | INTRAMUSCULAR | Status: DC
  Administered 2023-08-15: 60 ug via SUBCUTANEOUS
  Filled 2023-08-15: qty 0.3

## 2023-08-15 MED ORDER — HEPARIN SODIUM (PORCINE) 1000 UNIT/ML IJ SOLN
INTRAMUSCULAR | Status: AC
  Filled 2023-08-15: qty 10

## 2023-08-15 MED ORDER — ANTICOAGULANT SODIUM CITRATE 4% (200MG/5ML) IV SOLN: 5.0000 mL | Status: DC | PRN

## 2023-08-15 MED ORDER — HEPARIN SODIUM (PORCINE) 1000 UNIT/ML DIALYSIS
1000.0000 [IU] | INTRAMUSCULAR | Status: DC | PRN
  Administered 2023-08-15 – 2023-08-20 (×2): 3800 [IU]

## 2023-08-15 MED ORDER — MIDAZOLAM HCL 2 MG/2ML IJ SOLN
INTRAMUSCULAR | Status: AC | PRN
Start: 2023-08-15 — End: 2023-08-15
  Administered 2023-08-15: .5 mg via INTRAVENOUS

## 2023-08-15 MED ORDER — MIDAZOLAM HCL 2 MG/2ML IJ SOLN
INTRAMUSCULAR | Status: AC
  Filled 2023-08-15: qty 2

## 2023-08-15 NOTE — Progress Notes (Signed)
[facility] PROGRESS NOTE  Philip Benjamin (DOB: 02/15/1951) ION:[id number] PCP: Kirstie Peri, MD Outpatient Specialists: Nephrology, Dr. Wynelle Link  Brief Narrative: Philip Benjamin is a 72 y.o. male with a history of HTN, T2DM, HLD, CKD who presented to the ED 10/24 with progressive leg swelling and weakness. He was found to have progressive renal failure. Nephrology was consulted, recommending initiation of hemodialysis. Tunneled dialysis catheter was placed 10/25.   Subjective: No complaints upon return to [facility], though he is hungry and ate well. Worked with PT.   Objective: BP 134/76 (BP Location: Left Arm)   Pulse 69   Temp 98.7 F (37.1 C) (Oral)   Resp 19   Ht 6\' 1"  (1.854 m)   Wt 99.3 kg   SpO2 100%   BMI 28.88 kg/m   Gen: Older male in no distress sitting in chair resting quietly Pulm: Crackles at bases, no wheezes, nonlabored on room air  CV: RRR, no MRG, +JVD GI: Soft, NT, ND, +BS  Neuro: Alert and oriented. No new focal deficits. Ext: Warm, no deformities. Skin: TDC in right chest without discharge/bleeding, erythema or significant tenderness. 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 TDC placed in IR 10/25 by Dr. Bryn Gulling. Appreciate nephrology recommendations.  - Continue IV lasix  - Continue calcitriol. Check PTH, vit D.    Folate deficiency:  - Supplement and start renal multivitamin.   HTN:  - Continue metoprolol, diltiazem   T2DM:  - SSI - Continue basal insulin 15u, increase as oral intake increases. Change to carb-mod renal diet.    Anemia of CKD:  - Hgb stable, no bleeding.    NAGMA:  - Plan to address with HD.   HLD:  - Continue statin   BPH:  - Continue tamsulosin   Glaucoma:  - Continue gtt's   Weakness:  - PT/OT Tyrone Nine, MD [facility] [email or web address] 08/15/2023, 11:39 AM

## 2023-08-15 NOTE — Progress Notes (Addendum)
Physical Therapy Treatment Patient Details Name: Philip Benjamin MRN: [id number] DOB: Dec 23, 1950 Today's Date: 08/15/2023   History of Present Illness Philip Benjamin is a 72 y.o. male with a history of HTN, T2DM, HLD, CKD who presented to the ED 10/24 with progressive leg swelling and weakness. He and his wife give history, several weeks of worsening tiredness/fatigue. He's been told he'd need to follow up with vascular surgery and consented to dialysis with outpatient nephrology, Dr. Wynelle Link. He is a limited historian, though he reports he's been eating and drinking less, this morning he was unable to get up due to weakness that was new. His daughter is an Charity fundraiser at this hospital she is not at bedside. He denies any pain currently. States he makes a lot of urine with the lasix. Has no pains to speak of, no dyspnea currently.      He was found to have progression of advanced CKD on labs, appeared volume overloaded, so nephrology was consulted to initiate dialysis and the patient was admitted to [facility].    PT Comments  Patient demonstrates improvement for sitting up at bedside with slightly labored movement with good return for using BUE, fair/good return for completing BLE exercises while seated at bedside with verbal cueing and demonstration.  Patient very unsteady on feet using RW with frequent buckling of knees and limited to a few side steps before having to sit due to fall risk.  Patient tolerated sitting up in chair after therapy - RN aware.  Patient will benefit from continued skilled physical therapy in hospital and recommended venue below to increase strength, balance, endurance for safe ADLs and gait.      If plan is discharge home, recommend the following: A lot of help with walking and/or transfers;A lot of help with bathing/dressing/bathroom;Help with stairs or ramp for entrance;Assistance with cooking/housework   Can travel by private vehicle     Yes  Equipment Recommendations  None  recommended by PT    Recommendations for Other Services       Precautions / Restrictions Precautions Precautions: Fall Restrictions Weight Bearing Restrictions: No     Mobility  Bed Mobility Overal bed mobility: Needs Assistance Bed Mobility: Supine to Sit     Supine to sit: Supervision     General bed mobility comments: good return for sitting up at bedside with labored movement    Transfers Overall transfer level: Needs assistance Equipment used: Rolling walker (2 wheels) Transfers: Sit to/from Stand, Bed to chair/wheelchair/BSC Sit to Stand: Mod assist   Step pivot transfers: Mod assist       General transfer comment: unsteady labored movement with buckling of knees    Ambulation/Gait Ambulation/Gait assistance: Mod assist Gait Distance (Feet): 4 Feet Assistive device: Rolling walker (2 wheels) Gait Pattern/deviations: Decreased step length - right, Decreased step length - left, Decreased stride length, Trunk flexed, Knees buckling Gait velocity: slow     General Gait Details: limited a few slow labored side steps with buckling of knees and near loss of balance   Stairs             Wheelchair Mobility     Tilt Bed    Modified Rankin (Stroke Patients Only)       Balance Overall balance assessment: Needs assistance Sitting-balance support: Feet supported, No upper extremity supported Sitting balance-Leahy Scale: Fair Sitting balance - Comments: fair/good seated at EOB   Standing balance support: During functional activity, No upper extremity supported, Reliant on assistive device for  balance Standing balance-Leahy Scale: Poor Standing balance comment: fair/poor using RW                            Cognition Arousal: Alert Behavior During Therapy: WFL for tasks assessed/performed Overall Cognitive Status: Within Functional Limits for tasks assessed                                          Exercises General  Exercises - Lower Extremity Long Arc Quad: Seated, AROM, Strengthening, Both, 10 reps Hip Flexion/Marching: Seated, AROM, Strengthening, Both, 10 reps Toe Raises: Seated, AROM, Strengthening, Both, 10 reps Heel Raises: Seated, AROM, Strengthening, Both, 10 reps    General Comments        Pertinent Vitals/Pain Pain Assessment Pain Assessment: No/denies pain    Home Living                          Prior Function            PT Goals (current goals can now be found in the care plan section) Acute Rehab PT Goals Patient Stated Goal: return home with family to assist PT Goal Formulation: With patient/family Time For Goal Achievement: 08/28/23 Potential to Achieve Goals: Good Progress towards PT goals: Progressing toward goals    Frequency    Min 3X/week      PT Plan      Co-evaluation              AM-PAC PT "6 Clicks" Mobility   Outcome Measure  Help needed turning from your back to your side while in a flat bed without using bedrails?: None Help needed moving from lying on your back to sitting on the side of a flat bed without using bedrails?: A Little Help needed moving to and from a bed to a chair (including a wheelchair)?: A Lot Help needed standing up from a chair using your arms (e.g., wheelchair or bedside chair)?: A Lot Help needed to walk in hospital room?: A Lot Help needed climbing 3-5 steps with a railing? : A Lot 6 Click Score: 15    End of Session   Activity Tolerance: Patient tolerated treatment well;Patient limited by fatigue Patient left: in chair;with call bell/phone within reach;with chair alarm set Nurse Communication: Mobility status PT Visit Diagnosis: Unsteadiness on feet (R26.81);Other abnormalities of gait and mobility (R26.89);Muscle weakness (generalized) (M62.81)     Time: 1610-9604 PT Time Calculation (min) (ACUTE ONLY): 26 min  Charges:    $Therapeutic Exercise: 8-22 mins $Therapeutic Activity: 8-22 mins PT  General Charges $$ ACUTE PT VISIT: 1 Visit                     .12:34 PM, 08/15/23 Ocie Bob, MPT Physical Therapist with [facility] [phone] office [phone] mobile phone

## 2023-08-15 NOTE — Progress Notes (Signed)
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  Anemia:             - will check iron stores and Hgb  - Folate low, on supps  - iron replete  - will start ESA today   3.  BMM             - will check pTH and Vit D, pending   4.  DM: per primary   5.  Dispo: admitted  Subjective:    Got TDC this AM, appreciate IR.  For HD #1 today, #2 tomorrow   Objective:   BP 134/76 (BP Location: Left Arm)   Pulse 69   Temp 98.7 F (37.1 C) (Oral)   Resp 19   Ht 6\' 1"  (1.854 m)   Wt 99.3 kg   SpO2 100%   BMI 28.88 kg/m   Physical Exam: GEN NAD, sitting in bed HEENT EOMI PERRL NECK + prominent JVP PULM coarse bibasilar CV RRR ABD soft EXT 3+ LE edema to mid- shin NEURO AAO x 3 nonfocfal SKIN no rashes MSK + some sarcopenia ACCESS: [facility]  Labs: BMET Recent Labs  Lab 08/14/23 0515 08/15/23 0424  NA 134* 134*  K 4.7 4.6  CL 105 105  CO2 18* 19*  GLUCOSE 99 70  BUN 62* 64*  CREATININE 5.74* 5.75*  CALCIUM 8.8* 9.0  PHOS  --  6.0*   CBC Recent Labs  Lab 08/14/23 0515 08/15/23 0424 08/15/23 1036  WBC 6.6 5.5 5.5  HGB 8.3* 8.6* 8.6*  HCT 27.0* 27.0* 27.1*  MCV 91.8 90.3 91.6  PLT 184 192 189      Medications:     atorvastatin  20 mg Oral Daily   calcitRIOL  0.25 mcg Oral Daily   Chlorhexidine Gluconate Cloth  6 each Topical Q0600   diltiazem  480 mg Oral Daily   dorzolamide-timolol  1 drop Both Eyes BID   feeding supplement (NEPRO CARB STEADY)  237 mL Oral BID BM   folic acid  1 mg Oral Daily   furosemide  80 mg Intravenous BID   heparin  5,000 Units Subcutaneous Q8H   insulin aspart  0-5 Units Subcutaneous QHS   insulin aspart  0-9 Units Subcutaneous TID WC   insulin glargine-yfgn  15 Units Subcutaneous Daily   metoprolol tartrate  25 mg Oral QHS   metoprolol tartrate  50 mg Oral Daily    multivitamin  1 tablet Oral QHS   sodium chloride flush  3 mL Intravenous Q12H   tamsulosin  0.4 mg Oral Q supper     Bufford Buttner MD 08/15/2023, 2:26 PM

## 2023-08-15 NOTE — Progress Notes (Signed)
Pt transported to [facility] via [facility] 

## 2023-08-15 NOTE — Progress Notes (Signed)
Pt back to unit obtained vitals. And seng msg to attending diet updated to renal

## 2023-08-15 NOTE — Procedures (Signed)
Interventional Radiology Procedure Note  Procedure: Tunneled HD Catheter  Indication: Renal Failure  Findings:  Catheter tip is at the cavo-atrial junction. Catheter is ready for sure. Please refer to procedural dictation for full description.  Complications: None  EBL: < 10 mL  Yvett Rossel, MD [phone]   

## 2023-08-15 NOTE — Consult Note (Signed)
Chief Complaint: Patient was seen in consultation today for tunneled dialysis catheter placement   Referring Physician(s): Tyrone Nine  Supervising Physician: Mir, Mauri Reading  Patient Status: Philip Benjamin - inpatient   History of Present Illness: Philip Benjamin is a 72 y.o. male with a history of HTN, T2DM, HLD, CKD who presented to the [facility] ED 10/24 with progressive leg swelling, weakness and fatigue. He's been previously told he would need to follow up with vascular surgery and consented to dialysis with outpatient nephrology, Dr. Wynelle Link.    In the ED he was found to have progression of advanced CKD on labs and appeared volume overloaded. Nephrology was consulted to initiate dialysis and the patient was admitted to [facility].   Interventional Radiology has been asked to evaluate this patient for an image-guided tunneled dialysis catheter to facilitate his hemodialysis treatments.   Past Medical History:  Diagnosis Date   BPH (benign prostatic hyperplasia)    Chronic kidney disease    ckd stage 3   Diabetes mellitus without complication (HCC)    Hypertension     Past Surgical History:  Procedure Laterality Date   EYE SURGERY     INGUINAL HERNIA REPAIR Bilateral 01/23/2018   Procedure: BILATERAL OPEN INGUINAL HERNIA REPAIR WITH MESH;  Surgeon: Jimmye Norman, MD;  Location: [facility] OR;  Service: General;  Laterality: Bilateral;   INSERTION OF MESH Bilateral 01/23/2018   Procedure: INSERTION OF MESH;  Surgeon: Jimmye Norman, MD;  Location: [facility] OR;  Service: General;  Laterality: Bilateral;   VASECTOMY     ~1985    Allergies: Patient has no known allergies.  Medications: Prior to Admission medications   Medication Sig Start Date End Date Taking? Authorizing Provider  atorvastatin (LIPITOR) 20 MG tablet Take 20 mg by mouth daily. 01/02/18   [provider]  baclofen (LIORESAL) 10 MG tablet Take 10 mg by mouth daily as needed. For shoulder pain. Patient not taking: Reported  on 08/14/2023 10/10/17   [provider]  calcitRIOL (ROCALTROL) 0.25 MCG capsule Take by mouth. 08/11/23 08/10/24  [provider]  Cholecalciferol 125 MCG (5000 UT) capsule Take by mouth.    [provider]  cycloSPORINE (RESTASIS) 0.05 % ophthalmic emulsion SMARTSIG:In Eye(s) 08/13/23   [provider]  diltiazem (TIAZAC) 240 MG 24 hr capsule Take 480 mg by mouth daily. 12/22/17   [provider]  dorzolamide-timolol (COSOPT) 22.3-6.8 MG/ML ophthalmic solution Place 1 drop into both eyes 2 (two) times daily. 12/16/17   [provider]  furosemide (LASIX) 20 MG tablet Take 40 mg by mouth daily. 01/02/18   [provider]  LANTUS 100 UNIT/ML injection Inject 30 Units into the skin daily. 01/02/18   [provider]  Lifitegrast Benay Spice) 5 % SOLN Place 1 drop into both eyes every other day as needed (for dry eyes.). [place]ra Patient not taking: Reported on 08/14/2023    [provider]  losartan (COZAAR) 100 MG tablet Take 100 mg by mouth daily. 10/27/17   [provider]  metoprolol tartrate (LOPRESSOR) 25 MG tablet Take 25-50 mg by mouth See admin instructions. Patient states that he takes 50mg  by mouth in the morning and 25mg  by mouth at night. 12/16/17   [provider]  nystatin cream (MYCOSTATIN) Apply 1 application topically 2 (two) times daily as needed. FOR YEAST OR SKIN RASHES. 12/16/17   [provider]  oxyCODONE (OXY IR/ROXICODONE) 5 MG immediate release tablet Take 1-2 tablets (5-10 mg total) by mouth every  6 (six) hours as needed for severe pain. Patient not taking: Reported on 08/14/2023 01/23/18   Jimmye Norman, MD  tamsulosin (FLOMAX) 0.4 MG CAPS capsule Take 0.4 mg by mouth daily with supper. 12/16/17   [provider]  tamsulosin (FLOMAX) 0.4 MG CAPS capsule Take 1 capsule by mouth daily. Patient not taking: Reported on 08/14/2023    [provider]     No family  history on file.  Social History   Socioeconomic History   Marital status: Married    Spouse name: Not on file   Number of children: Not on file   Years of education: Not on file   Highest education level: Not on file  Occupational History   Not on file  Tobacco Use   Smoking status: Never   Smokeless tobacco: Never  Substance and Sexual Activity   Alcohol use: Never   Drug use: Never   Sexual activity: Not on file  Other Topics Concern   Not on file  Social History Narrative   Not on file   Social Determinants of Health   Financial Resource Strain: Not on file  Food Insecurity: No Food Insecurity (08/14/2023)   Hunger Vital Sign    Worried About Running Out of Food in the Last Year: Never true    Ran Out of Food in the Last Year: Never true  Transportation Needs: No Transportation Needs (08/14/2023)   PRAPARE - Administrator, Civil Service (Medical): No    Lack of Transportation (Non-Medical): No  Physical Activity: Not on file  Stress: Not on file  Social Connections: Not on file    Review of Systems: A 12 point ROS discussed and pertinent positives are indicated in the HPI above.  All other systems are negative.  Review of Systems  Constitutional:  Positive for fatigue. Negative for appetite change.  Eyes:  Positive for visual disturbance.       Glaucoma  Respiratory:  Negative for cough and shortness of breath.   Cardiovascular:  Positive for leg swelling. Negative for chest pain.  Gastrointestinal:  Negative for abdominal pain, diarrhea, nausea and vomiting.  Neurological:  Negative for dizziness and headaches.    Vital Signs: 98.5, 124/67, HR 85, RR 16, 100% on room air   Physical Exam Constitutional:      General: He is not in acute distress.    Appearance: He is not ill-appearing.  HENT:     Mouth/Throat:     Mouth: Mucous membranes are moist.     Pharynx: Oropharynx is clear.  Cardiovascular:     Rate and Rhythm: Normal rate.      Pulses: Normal pulses.  Pulmonary:     Effort: Pulmonary effort is normal.  Abdominal:     Tenderness: There is no abdominal tenderness.  Musculoskeletal:     Right lower leg: Edema present.     Left lower leg: Edema present.  Skin:    General: Skin is warm and dry.  Neurological:     Mental Status: He is alert and oriented to person, place, and time.  Psychiatric:        Mood and Affect: Mood normal.        Behavior: Behavior normal.        Thought Content: Thought content normal.        Judgment: Judgment normal.     Imaging: DG Chest Port 1 View  Result Date: 08/14/2023 CLINICAL DATA:  Weakness.  Lower extremity edema. EXAM: PORTABLE  CHEST 1 VIEW COMPARISON:  05/20/2023 FINDINGS: Stable cardiomediastinal contours. Low lung volumes. Eventration of the hemidiaphragms. Unchanged scarring and volume loss identified within the left base. No airspace consolidation, pleural effusions, or interstitial edema. No pneumothorax. IMPRESSION: Low lung volumes and left base scarring. No acute findings. Electronically Signed   By: Signa Kell M.D.   On: 08/14/2023 05:29    Labs:  CBC: Recent Labs    08/14/23 0515 08/15/23 0424  WBC 6.6 5.5  HGB 8.3* 8.6*  HCT 27.0* 27.0*  PLT 184 192    COAGS: No results for input(s): "INR", "APTT" in the last 8760 hours.  BMP: Recent Labs    08/14/23 0515 08/15/23 0424  NA 134* 134*  K 4.7 4.6  CL 105 105  CO2 18* 19*  GLUCOSE 99 70  BUN 62* 64*  CALCIUM 8.8* 9.0  CREATININE 5.74* 5.75*  GFRNONAA 10* 10*    LIVER FUNCTION TESTS: Recent Labs    08/14/23 0515 08/15/23 0424  BILITOT 0.5  --   AST 16  --   ALT 16  --   ALKPHOS 117  --   PROT 6.9  --   ALBUMIN 3.6 3.6    TUMOR MARKERS: No results for input(s): "AFPTM", "CEA", "CA199", "CHROMGRNA" in the last 8760 hours.  Assessment and Plan:  Advanced chronic kidney disease; pending hemodialysis: Philip Benjamin, 73 year old male, presents today to the [facility]  Interventional Radiology department for an image-guided tunneled dialysis catheter. He was transported to [facility] via [facility] and he will return to [facility] post procedure.   Risks and benefits discussed with the patient including, but not limited to bleeding, infection, vascular injury, pneumothorax which may require chest tube placement, air embolism or even death  All of the patient's questions were answered, patient is agreeable to proceed. He has been NPO.   Consent signed and in chart.  Thank you for this interesting consult.  I greatly enjoyed meeting Philip Benjamin and look forward to participating in their care.  A copy of this report was sent to the requesting provider on this date.  Electronically Signed: Alwyn Ren, AGACNP-BC [phone] 08/15/2023, 7:26 AM   I spent a total of 20 Minutes    in face to face in clinical consultation, greater than 50% of which was counseling/coordinating care for tunneled dialysis catheter.

## 2023-08-15 NOTE — Sedation Documentation (Signed)
[facility] at bedside and report given to [facility]. Joy at [facility] hospital called and given report prior.

## 2023-08-15 NOTE — Care Management Important Message (Signed)
Important Message  Patient Details  Name: Philip Benjamin MRN: [id number] Date of Birth: 22-Sep-1951   Important Message Given:  Yes - Medicare IM     Corey Harold 08/15/2023, 2:18 PM

## 2023-08-15 NOTE — TOC Initial Note (Signed)
Transition of Care [facility]) - Initial/Assessment Note    Patient Details  Name: Philip Benjamin MRN: [id number] Date of Birth: 02/25/1951  Transition of Care [facility]) CM/SW Contact:    Villa Herb, LCSWA Phone Number: 08/15/2023, 12:30 PM  Clinical Narrative:                 CSW noted per chart review that PT has recommended SNF for pt at D/C. CSW spoke with pts daughter who states that pt and family have spoken about this and they do feel pt would need SNF at D/C. Pts daughter will continue speaking with pt about this. Pts daughter is agreeable to SNF referral being sent out to local facilities with preference being SNF at [facility]. CSW also spoke with pts daughter about setting pt up with outpatient HD. Pts daughter states she feels they will want [facility] as pt lives in [place]. TOC to follow and work on [employer] process. TOC to follow.   Expected Discharge Plan: Skilled Nursing Facility Barriers to Discharge: Continued Medical Work up   Patient Goals and CMS Choice Patient states their goals for this hospitalization and ongoing recovery are:: go to SNF CMS Medicare.gov Compare Post Acute Care list provided to:: Patient Represenative (must comment) Choice offered to / list presented to : Adult Children [place] ownership interest in [facility].provided to:: Adult Children    Expected Discharge Plan and Services In-house Referral: Clinical Social Work Discharge Planning Services: CM Consult Post Acute Care Choice: Skilled Nursing Facility Living arrangements for the past 2 months: Single Family Home                                      Prior Living Arrangements/Services Living arrangements for the past 2 months: Single Family Home Lives with:: Spouse Patient language and need for interpreter reviewed:: Yes Do you feel safe going back to the place where you live?: Yes      Need for Family Participation in Patient Care: Yes (Comment) Care giver support system in  place?: Yes (comment)   Criminal Activity/Legal Involvement Pertinent to Current Situation/Hospitalization: No - Comment as needed  Activities of Daily Living   ADL Screening (condition at time of admission) Independently performs ADLs?: No Does the patient have a NEW difficulty with bathing/dressing/toileting/self-feeding that is expected to last >3 days?: Yes (Initiates electronic notice to provider for possible OT consult) Does the patient have a NEW difficulty with getting in/out of bed, walking, or climbing stairs that is expected to last >3 days?: Yes (Initiates electronic notice to provider for possible PT consult) Does the patient have a NEW difficulty with communication that is expected to last >3 days?: No Is the patient deaf or have difficulty hearing?: No Does the patient have difficulty seeing, even when wearing glasses/contacts?: No Does the patient have difficulty concentrating, remembering, or making decisions?: No  Permission Sought/Granted                  Emotional Assessment Appearance:: Appears stated age Attitude/Demeanor/Rapport: Engaged Affect (typically observed): Accepting   Alcohol / Substance Use: Not Applicable Psych Involvement: No (comment)  Admission diagnosis:  Renal failure [N19] Renal failure, unspecified chronicity [N19] Patient Active Problem List   Diagnosis Date Noted   Renal failure 08/14/2023   ESRD (end stage renal disease) (HCC) 08/14/2023   HTN (hypertension) 08/14/2023   T2DM (type 2 diabetes mellitus) (HCC)  08/14/2023   Hyperlipidemia 08/14/2023   BPH (benign prostatic hyperplasia) 08/14/2023   PCP:  Kirstie Peri, MD Pharmacy:   [facility] - [place], [place] - [address] [place] [address] Phone: [phone] Fax: [phone]  [id number] - [place], [place] - [address] AT [facility] [address] [place] [address] Phone: [phone] Fax:  [phone]     Social Determinants of Health (SDOH) Social History: SDOH Screenings   Food Insecurity: No Food Insecurity (08/14/2023)  Housing: Low Risk  (08/14/2023)  Transportation Needs: No Transportation Needs (08/14/2023)  Utilities: Not At Risk (08/14/2023)  Tobacco Use: Low Risk  (08/14/2023)   SDOH Interventions:     Readmission Risk Interventions    08/15/2023   12:29 PM  Readmission Risk Prevention Plan  Transportation Screening Complete  Home Care Screening Complete  Medication Review (RN CM) Complete

## 2023-08-15 NOTE — Progress Notes (Signed)
Placed Medical Necessity for Transport Certificate Complete Round trip in pt file

## 2023-08-15 NOTE — Procedures (Signed)
HD Note:  Some information was entered later than the data was gathered due to patient care needs. The stated time with the data is accurate.  Received patient in bed to unit.   Alert and oriented.   Informed consent signed and in chart.  Patient's questions were answered about the dialysis process.  Patient was calm while beginning the treatment.  Access used: Right upper chest HD catheter Access issues: No issues  Patient tolerated treatment well.   TX duration: 2 hours  Alert, without acute distress.  Total UF removed: 1000 ml  Hand-off given to patient's nurse.   Transported back to the room   Clessie Karras L. Dareen Piano, RN Kidney Dialysis Unit.    Mir, Al Corpus, MD  Physician Radiology   Procedures     Signed   Date of Service: 08/15/2023  8:00 AM  Related encounter: IR FLUORO/SHUNT/FIST from 08/15/2023 in [facility] INTERVENTIONAL RADIOLOGY   Signed      Interventional Radiology Procedure Note   Procedure: Tunneled HD Catheter   Indication: Renal Failure   Findings:  Catheter tip is at the cavo-atrial junction. Catheter is ready for sure. Please refer to procedural dictation for full description.   Complications: None   EBL: < 10 mL   Acquanetta Belling, MD [phone]

## 2023-08-15 NOTE — Progress Notes (Signed)
Report received from IR RN ASHTON  RIGHT UPPER CHEST. [facility] HAS BEEN SET BACK UP AND IN ROUTE ETA 20 LAST VT 116/66 HR 64 98 % ON RA

## 2023-08-15 NOTE — Progress Notes (Signed)
OT Cancellation Note  Patient Details Name: Philip Benjamin MRN: [id number] DOB: 26-Aug-1951   Cancelled Treatment:    Reason Eval/Treat Not Completed: Other (comment). Pt eating at time of attempted evaluation. Will attempt later as time permits.   Iysha Mishkin OT, MOT   Danie Chandler 08/15/2023, 10:08 AM

## 2023-08-15 NOTE — NC FL2 (Signed)
[place] MEDICAID FL2 LEVEL OF CARE FORM     IDENTIFICATION  Patient Name: Philip Benjamin Birthdate: 17-Feb-1951 Sex: male Admission Date (Current Location): 08/14/2023  [facility] and [place] Number:  [employer] and Address:  [facility],  [address], [place] [address]      Provider Number: [id number]  Attending Physician Name and Address:  Tyrone Nine, MD  Relative Name and Phone Number:       Current Level of Care: Hospital Recommended Level of Care: Skilled Nursing Facility Prior Approval Number:    Date Approved/Denied:   PASRR Number: [id number]  Discharge Plan: SNF    Current Diagnoses: Patient Active Problem List   Diagnosis Date Noted   Renal failure 08/14/2023   ESRD (end stage renal disease) (HCC) 08/14/2023   HTN (hypertension) 08/14/2023   T2DM (type 2 diabetes mellitus) (HCC) 08/14/2023   Hyperlipidemia 08/14/2023   BPH (benign prostatic hyperplasia) 08/14/2023    Orientation RESPIRATION BLADDER Height & Weight     Self, Time, Situation, Place  Normal Incontinent, External catheter Weight: 218 lb 14.7 oz (99.3 kg) Height:  6\' 1"  (185.4 cm)  BEHAVIORAL SYMPTOMS/MOOD NEUROLOGICAL BOWEL NUTRITION STATUS      Continent Diet (Renal/carb modified)  AMBULATORY STATUS COMMUNICATION OF NEEDS Skin   Extensive Assist Verbally Normal                       Personal Care Assistance Level of Assistance  Bathing, Feeding, Dressing Bathing Assistance: Limited assistance Feeding assistance: Independent Dressing Assistance: Limited assistance     Functional Limitations Info  Sight, Hearing, Speech Sight Info: Impaired Hearing Info: Adequate Speech Info: Adequate    SPECIAL CARE FACTORS FREQUENCY  PT (By licensed PT), OT (By licensed OT)     PT Frequency: 5 times weekly OT Frequency: 5 times weekly            Contractures Contractures Info: Not present    Additional Factors Info  Code Status, Allergies  Code Status Info: FULL Allergies Info: NKA           Current Medications (08/15/2023):  This is the current hospital active medication list Current Facility-Administered Medications  Medication Dose Route Frequency Provider Last Rate Last Admin   acetaminophen (TYLENOL) tablet 650 mg  650 mg Oral Q6H PRN Tyrone Nine, MD   650 mg at 08/14/23 2311   Or   acetaminophen (TYLENOL) suppository 650 mg  650 mg Rectal Q6H PRN Tyrone Nine, MD       alteplase (CATHFLO ACTIVASE) injection 2 mg  2 mg Intracatheter Once PRN Bufford Buttner, MD       anticoagulant sodium citrate solution 5 mL  5 mL Intracatheter PRN Bufford Buttner, MD       atorvastatin (LIPITOR) tablet 20 mg  20 mg Oral Daily Hazeline Junker B, MD   20 mg at 08/15/23 1011   calcitRIOL (ROCALTROL) capsule 0.25 mcg  0.25 mcg Oral Daily Hazeline Junker B, MD   0.25 mcg at 08/15/23 1011   Chlorhexidine Gluconate Cloth 2 % PADS 6 each  6 each Topical Q0600 Bufford Buttner, MD       diltiazem [facility]) 24 hr capsule 480 mg  480 mg Oral Daily Hazeline Junker B, MD   480 mg at 08/15/23 1011   dorzolamide-timolol (COSOPT) 2-0.5 % ophthalmic solution 1 drop  1 drop Both Eyes BID Tyrone Nine, MD   1 drop at 08/14/23 2103  feeding supplement (NEPRO CARB STEADY) liquid 237 mL  237 mL Oral BID BM Hazeline Junker B, MD   237 mL at 08/15/23 1019   folic acid (FOLVITE) tablet 1 mg  1 mg Oral Daily Hazeline Junker B, MD   1 mg at 08/15/23 1019   furosemide (LASIX) injection 80 mg  80 mg Intravenous BID Tyrone Nine, MD   80 mg at 08/15/23 1013   heparin injection 1,000 Units  1,000 Units Intracatheter PRN Bufford Buttner, MD       heparin injection 5,000 Units  5,000 Units Subcutaneous Q8H Tyrone Nine, MD   5,000 Units at 08/15/23 2725   hydrALAZINE (APRESOLINE) injection 10 mg  10 mg Intravenous Q4H PRN Tyrone Nine, MD       insulin aspart (novoLOG) injection 0-5 Units  0-5 Units Subcutaneous QHS Hazeline Junker B, MD       insulin aspart (novoLOG) injection  0-9 Units  0-9 Units Subcutaneous TID WC Tyrone Nine, MD   2 Units at 08/14/23 1727   insulin glargine-yfgn (SEMGLEE) injection 15 Units  15 Units Subcutaneous Daily Tyrone Nine, MD   15 Units at 08/15/23 1013   metoprolol tartrate (LOPRESSOR) tablet 25 mg  25 mg Oral QHS Tyrone Nine, MD   25 mg at 08/14/23 2103   metoprolol tartrate (LOPRESSOR) tablet 50 mg  50 mg Oral Daily Tyrone Nine, MD   50 mg at 08/15/23 1011   multivitamin (RENA-VIT) tablet 1 tablet  1 tablet Oral QHS Hazeline Junker B, MD       sodium chloride flush (NS) 0.9 % injection 3 mL  3 mL Intravenous Q12H Hazeline Junker B, MD   3 mL at 08/14/23 2108   tamsulosin (FLOMAX) capsule 0.4 mg  0.4 mg Oral Q supper Tyrone Nine, MD   0.4 mg at 08/14/23 1726   Facility-Administered Medications Ordered in Other Encounters  Medication Dose Route Frequency Provider Last Rate Last Admin   heparin sodium (porcine) injection 3,800 Units  3,800 Units Intracatheter Once Mir, Al Corpus, MD         Discharge Medications: Please see discharge summary for a list of discharge medications.  Relevant Imaging Results:  Relevant Lab Results:   Additional Information SSN: [id number] [place]

## 2023-08-16 DIAGNOSIS — N185 Chronic kidney disease, stage 5: Secondary | ICD-10-CM | POA: Diagnosis not present

## 2023-08-16 DIAGNOSIS — N179 Acute kidney failure, unspecified: Secondary | ICD-10-CM | POA: Diagnosis not present

## 2023-08-16 LAB — HEPATITIS B CORE ANTIBODY, TOTAL: Hep B Core Total Ab: NONREACTIVE

## 2023-08-16 LAB — RENAL FUNCTION PANEL
Albumin: 3.5 g/dL (ref 3.5–5.0)
Anion gap: 10 (ref 5–15)
BUN: 51 mg/dL — ABNORMAL HIGH (ref 8–23)
CO2: 23 mmol/L (ref 22–32)
Calcium: 8.9 mg/dL (ref 8.9–10.3)
Chloride: 102 mmol/L (ref 98–111)
Creatinine, Ser: 4.73 mg/dL — ABNORMAL HIGH (ref 0.61–1.24)
GFR, Estimated: 12 mL/min — ABNORMAL LOW (ref >60)
Glucose, Bld: 129 mg/dL — ABNORMAL HIGH (ref 70–99)
Phosphorus: 4.4 mg/dL (ref 2.5–4.6)
Potassium: 4 mmol/L (ref 3.5–5.1)
Sodium: 135 mmol/L (ref 135–145)

## 2023-08-16 LAB — GLUCOSE, CAPILLARY
Glucose-Capillary: 134 mg/dL — ABNORMAL HIGH (ref 70–99)
Glucose-Capillary: 173 mg/dL — ABNORMAL HIGH (ref 70–99)
Glucose-Capillary: 200 mg/dL — ABNORMAL HIGH (ref 70–99)

## 2023-08-16 LAB — HEPATITIS B SURFACE ANTIBODY, QUANTITATIVE: Hep B S AB Quant (Post): 88.1 m[IU]/mL

## 2023-08-16 LAB — VITAMIN D 25 HYDROXY (VIT D DEFICIENCY, FRACTURES): Vit D, 25-Hydroxy: 50.38 ng/mL (ref 30–100)

## 2023-08-16 MED ORDER — HEPARIN SODIUM (PORCINE) 1000 UNIT/ML IJ SOLN
INTRAMUSCULAR | Status: AC
  Filled 2023-08-16: qty 4

## 2023-08-16 MED ORDER — ONDANSETRON HCL 4 MG/2ML IJ SOLN
INTRAMUSCULAR | Status: AC
  Administered 2023-08-16: 4 mg
  Filled 2023-08-16: qty 2

## 2023-08-16 MED ORDER — ONDANSETRON HCL 4 MG/2ML IJ SOLN: 4.0000 mg | Freq: Four times a day (QID) | INTRAMUSCULAR | Status: DC | PRN

## 2023-08-16 NOTE — Progress Notes (Signed)
Amion sent to Dr. Jarvis Newcomer, patient BP 86/54 MAP 65. Notified provider that 0900 dose of Diltiazem and Metoprolol was held due to BP 97/54 after 0800 dose of IV lasix given.

## 2023-08-16 NOTE — Progress Notes (Signed)
   HEMODIALYSIS TREATMENT NOTE (HD#2):  Uneventful 3 hour heparin-free treatment completed using RIJ TDC. Cath tolerated prescribed flow with stable pressures.  Goal met: Soft BP but able to tolerate 1 liter removal without interruption in UF.  All blood was returned.    08/16/23 1745  Vitals  Temp 98.4 F (36.9 C)  Temp Source Oral  BP 115/70  MAP (mmHg) 81  BP Location Left Arm  BP Method Automatic  Patient Position (if appropriate) Lying  Pulse Rate 85  Pulse Rate Source Monitor  ECG Heart Rate 86  Resp 18  Oxygen Therapy  SpO2 100 %  O2 Device Room Air  Post Treatment  Dialyzer Clearance Clear  Hemodialysis Intake (mL) 0 mL  Liters Processed 54  Fluid Removed (mL) 1000 mL  Tolerated HD Treatment Yes  Post-Hemodialysis Comments Soft BP but goal met  Hemodialysis Catheter Right Subclavian Double lumen Permanent (Tunneled)  Placement Date/Time: 08/15/23 0820   Serial / Lot #: [id number]  Expiration Date: 04/27/28  Time Out: Correct patient;Correct site;Correct procedure  Maximum sterile barrier precautions: Hand hygiene;Cap;Mask;Sterile gown;Sterile gloves;Large sterile ...  Site Condition No complications  Blue Lumen Status Flushed;Heparin locked;Dead end cap in place  Red Lumen Status Flushed;Heparin locked;Dead end cap in place  Purple Lumen Status N/A  Catheter fill solution Heparin 1000 units/ml  Catheter fill volume (Arterial) 1.9 cc  Catheter fill volume (Venous) 1.9  Dressing Type Transparent;Tube stabilization device  Dressing Status Antimicrobial disc in place;Clean, Dry, Intact  Drainage Description None  Dressing Change Due 08/22/23  Post treatment catheter status Capped and Clamped    Arman Filter, RN AP [facility]

## 2023-08-16 NOTE — Progress Notes (Signed)
Patient back from dialysis and awake, requesting something to ear. Soup was given.  Patient is alert and oriented to self, place and situation. Patient stated he tolerated dialysis well. Vital signs are stable.

## 2023-08-16 NOTE — Progress Notes (Signed)
Dialysis nurse called and stated she was getting patient room ready for dialysis, I informed her of patients BP.

## 2023-08-16 NOTE — Plan of Care (Signed)
  Problem: Pain Management: Goal: General experience of comfort will improve Outcome: Progressing

## 2023-08-16 NOTE — Progress Notes (Signed)
[facility] PROGRESS NOTE  Philip Benjamin (DOB: [birth year]) ZOX:[id number] PCP: Kirstie Peri, MD Outpatient Specialists: Nephrology, Dr. Wynelle Link  Brief Narrative: Philip Benjamin is a 72 y.o. male with a history of HTN, T2DM, HLD, CKD who presented to the ED 10/24 with progressive leg swelling and weakness. He was found to have progressive renal failure. Nephrology was consulted, recommending initiation of hemodialysis. Tunneled dialysis catheter was placed 10/25. HD 10/25, 10/26.   Subjective: No complaints at this time, asks when dialysis might be today.   Objective: BP 130/64   Pulse 77   Temp 98.4 F (36.9 C)   Resp 16   Ht 6\' 1"  (1.854 m)   Wt 95.6 kg   SpO2 100%   BMI 27.81 kg/m   Gen: Pleasant male in no distress Pulm: Clear, nonlabored  CV: RRR, improved JVD, stable LE edema GI: Soft, NT, ND, +BS Neuro: Alert and oriented. No new focal deficits. Ext: Warm, no deformities. Skin: TDC appears c/d/i. 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 TDC placed in IR 10/25 by Dr. Bryn Gulling. Appreciate nephrology recommendations.  - Continue IV lasix, also UF with HD.  - Continue calcitriol. Check PTH, vit D (I'm process).    Folate deficiency:  - Supplemented and started renal multivitamin.   HTN:  - Continue metoprolol, diltiazem   T2DM:  - SSI - Continue basal insulin 15u, fasting glucose at goal this AM. increase as oral intake increases. Continue carb-mod renal diet.    Anemia of CKD:  - Hgb stable, no bleeding.    NAGMA: Resolved with HD.   HLD:  - Continue statin   BPH:  - Continue tamsulosin   Glaucoma:  - Continue gtt's   Weakness:  - PT/OT Philip Nine, MD [facility] [email or web address] 08/16/2023, 12:58 PM

## 2023-08-17 DIAGNOSIS — N179 Acute kidney failure, unspecified: Secondary | ICD-10-CM | POA: Diagnosis not present

## 2023-08-17 DIAGNOSIS — N185 Chronic kidney disease, stage 5: Secondary | ICD-10-CM | POA: Diagnosis not present

## 2023-08-17 LAB — RENAL FUNCTION PANEL
Albumin: 3.4 g/dL — ABNORMAL LOW (ref 3.5–5.0)
Anion gap: 9 (ref 5–15)
BUN: 39 mg/dL — ABNORMAL HIGH (ref 8–23)
CO2: 26 mmol/L (ref 22–32)
Calcium: 8.9 mg/dL (ref 8.9–10.3)
Chloride: 97 mmol/L — ABNORMAL LOW (ref 98–111)
Creatinine, Ser: 4.09 mg/dL — ABNORMAL HIGH (ref 0.61–1.24)
GFR, Estimated: 15 mL/min — ABNORMAL LOW (ref >60)
Glucose, Bld: 116 mg/dL — ABNORMAL HIGH (ref 70–99)
Phosphorus: 4.3 mg/dL (ref 2.5–4.6)
Potassium: 3.9 mmol/L (ref 3.5–5.1)
Sodium: 132 mmol/L — ABNORMAL LOW (ref 135–145)

## 2023-08-17 LAB — GLUCOSE, CAPILLARY
Glucose-Capillary: 127 mg/dL — ABNORMAL HIGH (ref 70–99)
Glucose-Capillary: 232 mg/dL — ABNORMAL HIGH (ref 70–99)
Glucose-Capillary: 171 mg/dL — ABNORMAL HIGH (ref 70–99)
Glucose-Capillary: 191 mg/dL — ABNORMAL HIGH (ref 70–99)

## 2023-08-17 MED ORDER — POLYETHYLENE GLYCOL 3350 17 G PO PACK
17.0000 g | PACK | Freq: Every day | ORAL | Status: DC
  Filled 2023-08-17 (×3): qty 1

## 2023-08-17 MED ORDER — METOPROLOL TARTRATE 25 MG PO TABS
25.0000 mg | ORAL_TABLET | Freq: Every day | ORAL | Status: DC
  Filled 2023-08-17: qty 1

## 2023-08-17 MED ORDER — DILTIAZEM HCL ER BEADS 240 MG PO CP24: 360.0000 mg | ORAL_CAPSULE | Freq: Every day | ORAL | Status: DC

## 2023-08-17 MED ORDER — SORBITOL 70 % SOLN: 30.0000 mL | Freq: Every day | Status: DC | PRN

## 2023-08-17 MED ORDER — DILTIAZEM HCL ER COATED BEADS 120 MG PO CP24
360.0000 mg | ORAL_CAPSULE | Freq: Every day | ORAL | Status: DC
  Administered 2023-08-17: 360 mg via ORAL
  Filled 2023-08-17 (×2): qty 3

## 2023-08-17 MED ORDER — BISACODYL 10 MG RE SUPP: 10.0000 mg | Freq: Every day | RECTAL | Status: DC | PRN

## 2023-08-17 MED ORDER — SENNOSIDES-DOCUSATE SODIUM 8.6-50 MG PO TABS
1.0000 | ORAL_TABLET | Freq: Every day | ORAL | Status: DC
  Administered 2023-08-17 – 2023-08-19 (×3): 1 via ORAL
  Filled 2023-08-17 (×4): qty 1

## 2023-08-17 NOTE — Plan of Care (Signed)
  Problem: Education: Goal: Knowledge of General Education information will improve Description: Including pain rating scale, medication(s)/side effects and non-pharmacologic comfort measures Outcome: Progressing   Problem: Clinical Measurements: Goal: Ability to maintain clinical measurements within normal limits will improve Outcome: Progressing Goal: Will remain free from infection Outcome: Progressing   Problem: Activity: Goal: Risk for activity intolerance will decrease Outcome: Progressing   Problem: Nutrition: Goal: Adequate nutrition will be maintained Outcome: Progressing   Problem: Coping: Goal: Level of anxiety will decrease Outcome: Progressing   Problem: Elimination: Goal: Will not experience complications related to urinary retention Outcome: Progressing   Problem: Pain Management: Goal: General experience of comfort will improve Outcome: Progressing   Problem: Safety: Goal: Ability to remain free from injury will improve Outcome: Progressing   Problem: Education: Goal: Ability to describe self-care measures that may prevent or decrease complications (Diabetes Survival Skills Education) will improve Outcome: Progressing   Problem: Coping: Goal: Ability to adjust to condition or change in health will improve Outcome: Progressing   Problem: Fluid Volume: Goal: Ability to maintain a balanced intake and output will improve Outcome: Progressing

## 2023-08-17 NOTE — Plan of Care (Signed)
  Problem: Education: Goal: Knowledge of General Education information will improve Description Including pain rating scale, medication(s)/side effects and non-pharmacologic comfort measures Outcome: Progressing   Problem: Health Behavior/Discharge Planning: Goal: Ability to manage health-related needs will improve Outcome: Progressing   

## 2023-08-17 NOTE — Progress Notes (Signed)
[facility] PROGRESS NOTE  Philip Benjamin (DOB: 05-Jul-1951) [id number] PCP: Kirstie Peri, MD Outpatient Specialists: Nephrology, Dr. Wynelle Link  Brief Narrative: Philip Benjamin is a 72 y.o. male with a history of HTN, T2DM, HLD, CKD who presented to the ED 10/24 with progressive leg swelling and weakness. He was found to have progressive renal failure. Nephrology was consulted, recommending initiation of hemodialysis. Tunneled dialysis catheter was placed 10/25. HD 10/25, 10/26. Continues to have significant edema, but improving volume status. Will require rehabilitation at SNF which is also pursued.   Subjective: Daughter and spouse at bedside, pt without complaints, feels his leg swelling is unchanged though family agrees it's improved. Tolerated dialysis without any issues again.  Objective: BP 106/62 (BP Location: Left Arm)   Pulse 91   Temp 98.2 F (36.8 C) (Oral)   Resp 20   Ht 6\' 1"  (1.854 m)   Wt 94.5 kg   SpO2 100%   BMI 27.49 kg/m   Gen: No distress Pulm: Clear, nonlabored  CV: RRR, no MRG GI: Soft, NT, ND, +BS  Neuro: Alert and oriented. No new focal deficits. Ext: Warm, no deformities. Skin: No new rashes, lesions or ulcers on visualized skin. 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 TDC placed in IR 10/25 by Dr. Bryn Gulling. Appreciate nephrology recommendations.  - Continue IV lasix, monitoring I/O (maintaining good negative balance, down ~5kg thus far), UF with HD.  - Continue calcitriol. Check PTH (in process). Vit D is wnl at 50.    Folate deficiency:  - Supplemented and started renal multivitamin.   HTN:  - Continue metoprolol, diltiazem, reduce doses with soft BPs.    T2DM:  - SSI - Continue basal insulin 15u, fasting glucose at goal this AM. increase as oral intake increases. Continue carb-mod renal diet.    Anemia of CKD:  - Hgb stable,  no bleeding.    NAGMA: Resolved with HD.   HLD:  - Continue statin   BPH:  - Continue tamsulosin   Glaucoma:  - Continue gtt's   Weakness:  - PT/OT Tyrone Nine, MD [email or web address] 08/17/2023, 10:39 AM

## 2023-08-18 DIAGNOSIS — N179 Acute kidney failure, unspecified: Secondary | ICD-10-CM | POA: Diagnosis not present

## 2023-08-18 DIAGNOSIS — N185 Chronic kidney disease, stage 5: Secondary | ICD-10-CM | POA: Diagnosis not present

## 2023-08-18 DIAGNOSIS — E440 Moderate protein-calorie malnutrition: Secondary | ICD-10-CM | POA: Insufficient documentation

## 2023-08-18 LAB — GLUCOSE, CAPILLARY
Glucose-Capillary: 127 mg/dL — ABNORMAL HIGH (ref 70–99)
Glucose-Capillary: 164 mg/dL — ABNORMAL HIGH (ref 70–99)
Glucose-Capillary: 146 mg/dL — ABNORMAL HIGH (ref 70–99)
Glucose-Capillary: 289 mg/dL — ABNORMAL HIGH (ref 70–99)

## 2023-08-18 LAB — CBC
HCT: 29.4 % — ABNORMAL LOW (ref 39.0–52.0)
Hemoglobin: 8.7 g/dL — ABNORMAL LOW (ref 13.0–17.0)
MCH: 29.8 pg (ref 26.0–34.0)
MCHC: 29.6 g/dL — ABNORMAL LOW (ref 30.0–36.0)
MCV: 100.7 fL — ABNORMAL HIGH (ref 80.0–100.0)
Platelets: 150 10*3/uL (ref 150–400)
RBC: 2.92 MIL/uL — ABNORMAL LOW (ref 4.22–5.81)
RDW: 12.6 % (ref 11.5–15.5)
WBC: 7.9 10*3/uL (ref 4.0–10.5)
nRBC: 0 % (ref 0.0–0.2)

## 2023-08-18 LAB — RENAL FUNCTION PANEL
Albumin: 3.4 g/dL — ABNORMAL LOW (ref 3.5–5.0)
Anion gap: 14 (ref 5–15)
BUN: 55 mg/dL — ABNORMAL HIGH (ref 8–23)
CO2: 22 mmol/L (ref 22–32)
Calcium: 8.9 mg/dL (ref 8.9–10.3)
Chloride: 95 mmol/L — ABNORMAL LOW (ref 98–111)
Creatinine, Ser: 4.96 mg/dL — ABNORMAL HIGH (ref 0.61–1.24)
GFR, Estimated: 12 mL/min — ABNORMAL LOW (ref >60)
Glucose, Bld: 119 mg/dL — ABNORMAL HIGH (ref 70–99)
Phosphorus: 4.7 mg/dL — ABNORMAL HIGH (ref 2.5–4.6)
Potassium: 3.8 mmol/L (ref 3.5–5.1)
Sodium: 131 mmol/L — ABNORMAL LOW (ref 135–145)

## 2023-08-18 LAB — PARATHYROID HORMONE, INTACT (NO CA): PTH: 119 pg/mL — ABNORMAL HIGH (ref 15–65)

## 2023-08-18 MED ORDER — METOPROLOL TARTRATE 25 MG PO TABS
12.5000 mg | ORAL_TABLET | Freq: Two times a day (BID) | ORAL | Status: DC
  Administered 2023-08-18 – 2023-08-20 (×6): 12.5 mg via ORAL
  Filled 2023-08-18 (×5): qty 1

## 2023-08-18 MED ORDER — HEPARIN SODIUM (PORCINE) 1000 UNIT/ML IJ SOLN
INTRAMUSCULAR | Status: AC
Start: 2023-08-18 — End: ?
  Filled 2023-08-18: qty 4

## 2023-08-18 NOTE — Progress Notes (Signed)
   HEMODIALYSIS TREATMENT NOTE (HD#3):   3.5 hour heparin-free treatment completed using right internal jugular TDC.  Goal not met; UF was limited by hypotension again.  Net UF 1L with SBPs in low 100s.  All blood was returned.  Post-HD:  08/18/23 1820  Vitals  Temp 98.1 F (36.7 C)  Temp Source Oral  BP (!) 102/58  MAP (mmHg) 71  BP Location Left Arm  BP Method Automatic  Patient Position (if appropriate) Sitting  Pulse Rate 81  Pulse Rate Source Monitor  ECG Heart Rate 83  Resp 16  Oxygen Therapy  SpO2 99 %  O2 Device Room Air  Post Treatment  Dialyzer Clearance Lightly streaked  Hemodialysis Intake (mL) 0 mL  Liters Processed 63.1  Fluid Removed (mL) 1000 mL  Tolerated HD Treatment No (Comment)  Post-Hemodialysis Comments UF limited by hypotension again  Hemodialysis Catheter Right Subclavian Double lumen Permanent (Tunneled)  Placement Date/Time: 08/15/23 0820   Serial / Lot #: [id number]  Expiration Date: 04/27/28  Time Out: Correct patient;Correct site;Correct procedure  Maximum sterile barrier precautions: Hand hygiene;Cap;Mask;Sterile gown;Sterile gloves;Large sterile ...  Site Condition No complications  Blue Lumen Status Flushed;Heparin locked;Dead end cap in place  Red Lumen Status Flushed;Heparin locked;Dead end cap in place  Purple Lumen Status N/A  Catheter fill solution Heparin 1000 units/ml  Catheter fill volume (Arterial) 1.9 cc  Catheter fill volume (Venous) 1.9  Dressing Type Transparent;Tube stabilization device  Dressing Status Antimicrobial disc in place;Clean, Dry, Intact  Drainage Description None  Dressing Change Due 08/22/23  Post treatment catheter status Capped and Clamped    Arman Filter, RN AP [facility]

## 2023-08-18 NOTE — Plan of Care (Signed)

## 2023-08-18 NOTE — Progress Notes (Signed)
Patient resting at this time, no complaints of pain or discomfort. Patient slept thoroughly throughout the night. Metoprolol was held at 2200 due to BP. Call light is within reach, bed alarm is on, and bed is in the lowest position.

## 2023-08-18 NOTE — Progress Notes (Addendum)
[facility] Progress Note   Assessment/ Plan:    CKD V --> ESRD: with dysgeusia/ anorexia, increased hypervolemia             - TDC placed 10/25. HD #1 10/25, #2 10/26. #3 planned for today. Pending outpatient placement per SW/CM  - Next treatment after today tentatively planned for Wed unless outpatient placement plan dictates otherwise  2.  Anemia:             - transfuse prn for hgb <7. Last hgb 8.6, monitor. On ESA and folate   3.  BMM             - po4 4.7-acceptable. PTH 119-acceptable. On calcitriol. Renal diet  4. HTN/volume -BP on the softer side, adjust anti-HTN accordingly. UF as tolerated   4.  DM: per primary   5.  Dispo: admitted, plan for SNF on d/c, outpatient HD placement per SW/CM  Anthony Sar, MD [facility]  Subjective:    Patient seen and examined bedside. No complaints/acute events. He reports that he tolerated dialysis on Friday and Saturday   Objective:   BP 115/77 (BP Location: Right Arm)   Pulse 79   Temp 98.6 F (37 C)   Resp 18   Ht 6\' 1"  (1.854 m)   Wt 94.7 kg   SpO2 99%   BMI 27.54 kg/m   Physical Exam: Gen: NAD, sitting up eating breatkfast CV: RRR Pulm: CTA BL Abd: soft, nt/nd Ext: 1+ pitting edema b/l LEs Neuro: awake, alert Dialysis access: [facility] c/d/i  Labs: BMET Recent Labs  Lab 08/14/23 0515 08/15/23 0424 08/16/23 0428 08/17/23 0426 08/18/23 0459  NA 134* 134* 135 132* 131*  K 4.7 4.6 4.0 3.9 3.8  CL 105 105 102 97* 95*  CO2 18* 19* 23 26 22   GLUCOSE 99 70 129* 116* 119*  BUN 62* 64* 51* 39* 55*  CREATININE 5.74* 5.75* 4.73* 4.09* 4.96*  CALCIUM 8.8* 9.0 8.9 8.9 8.9  PHOS  --  6.0* 4.4 4.3 4.7*   CBC Recent Labs  Lab 08/14/23 0515 08/15/23 0424 08/15/23 1036  WBC 6.6 5.5 5.5  HGB 8.3* 8.6* 8.6*  HCT 27.0* 27.0* 27.1*  MCV 91.8 90.3 91.6  PLT 184 192 189      Medications:     atorvastatin  20 mg Oral Daily   calcitRIOL  0.25 mcg Oral Daily   Chlorhexidine Gluconate  Cloth  6 each Topical Q0600   darbepoetin (ARANESP) injection - NON-DIALYSIS  60 mcg Subcutaneous Q Fri-1800   diltiazem  360 mg Oral Daily   dorzolamide-timolol  1 drop Both Eyes BID   feeding supplement (NEPRO CARB STEADY)  237 mL Oral BID BM   folic acid  1 mg Oral Daily   furosemide  80 mg Intravenous BID   heparin  5,000 Units Subcutaneous Q8H   insulin aspart  0-5 Units Subcutaneous QHS   insulin aspart  0-9 Units Subcutaneous TID WC   insulin glargine-yfgn  15 Units Subcutaneous Daily   metoprolol tartrate  25 mg Oral QHS   metoprolol tartrate  25 mg Oral Daily   multivitamin  1 tablet Oral QHS   polyethylene glycol  17 g Oral Daily   senna-docusate  1 tablet Oral Daily   sodium chloride flush  3 mL Intravenous Q12H   tamsulosin  0.4 mg Oral Q supper     Anthony Sar, MD [facility] 08/18/2023, 7:20 AM

## 2023-08-18 NOTE — Progress Notes (Signed)
[facility] PROGRESS NOTE  Philip Benjamin (DOB: 08/01/1951) ZOX:[id number] PCP: Kirstie Peri, MD Outpatient Specialists: Nephrology, Dr. Wynelle Link  Brief Narrative: Philip Benjamin is a 72 y.o. male with a history of HTN, T2DM, HLD, CKD who presented to the ED 10/24 with progressive leg swelling and weakness. He was found to have progressive renal failure. Nephrology was consulted, recommending initiation of hemodialysis. Tunneled dialysis catheter was placed 10/25. HD 10/25, 10/26. Continues to have significant edema, but improving volume status. Will require rehabilitation at SNF which is also pursued.   Subjective: No complaints. No lightheadedness, dizziness, palpitations, chest pain or dyspnea.   Objective: BP 98/60 (BP Location: Left Arm)   Pulse 87   Temp 98.6 F (37 C)   Resp 18   Ht 6\' 1"  (1.854 m)   Wt 94.7 kg   SpO2 99%   BMI 27.54 kg/m   Gen: No distress Pulm: Clear, nonlabored  CV: RRR, no MRG, edema in legs decreasing, now ~1+.  GI: Soft, NT, ND, +BS  Neuro: Alert and oriented. No new focal deficits. Ext: Warm, no deformities. 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 TDC placed in IR 10/25 by Dr. Bryn Gulling. Appreciate nephrology recommendations.  - Continue IV lasix, monitoring I/O (maintaining good negative balance, down ~5kg thus far), UF with HD.  - Continue calcitriol. Check PTH (in process). Vit D is wnl at 50.    Folate deficiency:  - Supplemented and started renal multivitamin.   HTN: BP is decreasing, so medications being titrated.  - Holding home losartan - Decrease diltiazem 480mg  > 240mg  - Decrease metoprolol tartrate from 25mg  qAM and 50mg  qPM to 12.5mg  BID.  - Hold parameters in place.    T2DM:  - SSI - Continue basal insulin 15u, at inpatient goal.  - Continue carb-mod renal diet.    Anemia of  CKD:  - Hgb stable, no bleeding. On ESA per nephrology.   NAGMA: Resolved with HD.   HLD:  - Continue statin   BPH:  - Continue tamsulosin   Glaucoma:  - Continue gtt's   Weakness:  - PT/OT >> pursuing SNF placement for rehabilitation  Tyrone Nine, MD [facility] [email or web address] 08/18/2023, 9:07 AM

## 2023-08-18 NOTE — TOC Progression Note (Signed)
Transition of Care [facility]) - Progression Note    Patient Details  Name: Philip Benjamin MRN: [id number] Date of Birth: 1950/11/13  [facility] CM/SW Contact  Villa Herb, [place] Phone Number: 08/18/2023, 12:54 PM  Clinical Narrative:    CSW spoke with pts daughter to review bed offers. They would like to accept SNF bed at [facility]. CSW updated Destiny in admissions of bed acceptance. TOC continues to work on [employer] process with [facility] which is pt and family's preferred facility. TOC to follow.   Expected Discharge Plan: Skilled Nursing Facility Barriers to Discharge: Continued Medical Work up  Expected Discharge Plan and Services In-house Referral: Clinical Social Work Discharge Planning Services: CM Consult Post Acute Care Choice: Skilled Nursing Facility Living arrangements for the past 2 months: Single Family Home                                       Social Determinants of Health (SDOH) Interventions SDOH Screenings   Food Insecurity: No Food Insecurity (08/14/2023)  Housing: Low Risk  (08/14/2023)  Transportation Needs: No Transportation Needs (08/14/2023)  Utilities: Not At Risk (08/14/2023)  Tobacco Use: Low Risk  (08/14/2023)    Readmission Risk Interventions    08/15/2023   12:29 PM  Readmission Risk Prevention Plan  Transportation Screening Complete  Home Care Screening Complete  Medication Review (RN CM) Complete

## 2023-08-18 NOTE — Progress Notes (Signed)
Mobility Specialist Progress Note:    08/18/23 1035  Mobility  Activity Ambulated with assistance in hallway  Level of Assistance Minimal assist, patient does 75% or more  Assistive Device Front wheel walker  Distance Ambulated (ft) 100 ft  Range of Motion/Exercises Active;All extremities  Activity Response Tolerated well  Mobility Referral Yes  $Mobility charge 1 Mobility  Mobility Specialist Start Time (ACUTE ONLY) 1035  Mobility Specialist Stop Time (ACUTE ONLY) 1045  Mobility Specialist Time Calculation (min) (ACUTE ONLY) 10 min   Pt received in chair, family in room. Agreeable to mobility, required MinA to stand and CGA to ambulate with RW. Tolerated well, pt unsteady d/t weakness. Returned pt to room, left in chair. All needs met.   Lawerance Bach Mobility Specialist Please contact via Special educational needs teacher or  Rehab office at [phone]

## 2023-08-18 NOTE — Plan of Care (Signed)
  Problem: Education: Goal: Knowledge of General Education information will improve Description: Including pain rating scale, medication(s)/side effects and non-pharmacologic comfort measures Outcome: Progressing   Problem: Activity: Goal: Risk for activity intolerance will decrease Outcome: Progressing   Problem: Nutrition: Goal: Adequate nutrition will be maintained Outcome: Progressing   Problem: Pain Management: Goal: General experience of comfort will improve Outcome: Progressing

## 2023-08-18 NOTE — Progress Notes (Signed)
Initial Nutrition Assessment  DOCUMENTATION CODES:   Non-severe (moderate) malnutrition in context of acute illness/injury  INTERVENTION:   Renal diet education provided Continue Nepro Shake po BID, each supplement provides 425 kcal and 19 grams protein Continue renal multivitamin daily  NUTRITION DIAGNOSIS:   Severe Malnutrition related to acute illness (new ESRD) as evidenced by moderate muscle depletion, moderate fat depletion, percent weight loss (8% weight loss x 3 months).  GOAL:   Patient will meet greater than or equal to 90% of their needs  MONITOR:   PO intake, Supplement acceptance  REASON FOR ASSESSMENT:   Malnutrition Screening Tool, Consult Diet education  ASSESSMENT:   72 yo male admitted with progressive renal failure, new ESRD. PMH includes DM, HTN, CKD, BPH, HLD.  Spoke with patient's daughter and wife regarding renal diet. Provided "Nutrition for Dialysis" handout from the [employer] of [employer] and reviewed renal diet guidelines. They had no questions. Spoke with patient about usual intake, but he was sleepy. RN reports patient ate well for breakfast today. He likes the Nepro shakes and has been drinking them BID. He reports his usual weight is 225-230 lbs 2-3 months ago. Currently 208 lbs. 8% weight loss over the past 3 months is severe.   Tunneled dialysis catheter placed 10/25. Received HD 10/25 and 10/26. Next HD scheduled for today.   Labs reviewed. Na 131, BUN 55, creat 4.96, phos 4.7, folate 3.8 (L), vitamin D 50.38 (WNL), vitamin B-12 275 (WNL) CBG: [phone]  Medications reviewed and include calcitriol, aranesp, folic acid, lasix, novolog, semglee, rena-vit, senokot-s, flomax.  Weight down by 4.6 kg since admission with volume removal / initiation of HD.   NUTRITION - FOCUSED PHYSICAL EXAM:  Flowsheet Row Most Recent Value  Orbital Region Moderate depletion  Upper Arm Region Mild depletion  Thoracic and Lumbar Region Mild  depletion  Buccal Region Moderate depletion  Temple Region Moderate depletion  Clavicle Bone Region Moderate depletion  Clavicle and Acromion Bone Region Moderate depletion  Scapular Bone Region Unable to assess  Dorsal Hand Mild depletion  Patellar Region Unable to assess  Anterior Thigh Region Unable to assess  Posterior Calf Region Unable to assess  Edema (RD Assessment) Unable to assess  Hair Reviewed  Eyes Reviewed  Mouth Reviewed  Skin Reviewed  Nails Reviewed       Diet Order:   Diet Order             Diet renal/carb modified with fluid restriction Diet-HS Snack? Nothing; Fluid restriction: 1200 mL Fluid; Room service appropriate? Yes; Fluid consistency: Thin  Diet effective now                   EDUCATION NEEDS:   Education needs have been addressed  Skin:  Skin Assessment: Reviewed RN Assessment  Last BM:  10/27 type 2  Height:   Ht Readings from Last 1 Encounters:  08/14/23 6\' 1"  (1.854 m)    Weight:   Wt Readings from Last 1 Encounters:  08/18/23 94.7 kg    Ideal Body Weight:  83.6 kg  BMI:  Body mass index is 27.54 kg/m.  Estimated Nutritional Needs:   Kcal:  2400-2600  Protein:  100-120 gm  Fluid:  1 L + UOP   Gabriel Rainwater RD, LDN, CNSC Please refer to Amion for contact information.

## 2023-08-19 DIAGNOSIS — N179 Acute kidney failure, unspecified: Secondary | ICD-10-CM | POA: Diagnosis not present

## 2023-08-19 DIAGNOSIS — N185 Chronic kidney disease, stage 5: Secondary | ICD-10-CM | POA: Diagnosis not present

## 2023-08-19 LAB — RENAL FUNCTION PANEL
Albumin: 3.2 g/dL — ABNORMAL LOW (ref 3.5–5.0)
Anion gap: 9 (ref 5–15)
BUN: 39 mg/dL — ABNORMAL HIGH (ref 8–23)
CO2: 27 mmol/L (ref 22–32)
Calcium: 8.7 mg/dL — ABNORMAL LOW (ref 8.9–10.3)
Chloride: 95 mmol/L — ABNORMAL LOW (ref 98–111)
Creatinine, Ser: 3.73 mg/dL — ABNORMAL HIGH (ref 0.61–1.24)
GFR, Estimated: 16 mL/min — ABNORMAL LOW (ref >60)
Glucose, Bld: 103 mg/dL — ABNORMAL HIGH (ref 70–99)
Phosphorus: 3.3 mg/dL (ref 2.5–4.6)
Potassium: 3.7 mmol/L (ref 3.5–5.1)
Sodium: 131 mmol/L — ABNORMAL LOW (ref 135–145)

## 2023-08-19 LAB — GLUCOSE, CAPILLARY
Glucose-Capillary: 113 mg/dL — ABNORMAL HIGH (ref 70–99)
Glucose-Capillary: 210 mg/dL — ABNORMAL HIGH (ref 70–99)
Glucose-Capillary: 107 mg/dL — ABNORMAL HIGH (ref 70–99)
Glucose-Capillary: 160 mg/dL — ABNORMAL HIGH (ref 70–99)
Glucose-Capillary: 210 mg/dL — ABNORMAL HIGH (ref 70–99)

## 2023-08-19 MED ORDER — CHLORHEXIDINE GLUCONATE CLOTH 2 % EX PADS
6.0000 | MEDICATED_PAD | Freq: Every day | CUTANEOUS | Status: DC
  Administered 2023-08-19 – 2023-08-20 (×2): 6 via TOPICAL

## 2023-08-19 NOTE — Progress Notes (Signed)
Mobility Specialist Progress Note:    08/19/23 1330  Mobility  Activity Ambulated with assistance in hallway  Level of Assistance Minimal assist, patient does 75% or more  Assistive Device Front wheel walker  Distance Ambulated (ft) 200 ft  Range of Motion/Exercises Active;All extremities  Activity Response Tolerated well  Mobility Referral Yes  $Mobility charge 1 Mobility  Mobility Specialist Start Time (ACUTE ONLY) 1330  Mobility Specialist Stop Time (ACUTE ONLY) 1345  Mobility Specialist Time Calculation (min) (ACUTE ONLY) 15 min   Pt received in chair, agreeable to mobility. Required MinA to stand and CGA to ambulate with RW. Tolerated well, pt unsteady at EOS. Returned pt to room, left in chair. Alarm on, call bell in reach. All needs met.   Lawerance Bach Mobility Specialist Please contact via Special educational needs teacher or  Rehab office at [phone]

## 2023-08-19 NOTE — Progress Notes (Signed)
[facility] NEPHROLOGY PROGRESS NOTE  Assessment/ Plan: Pt is a 72 y.o. yo male    # CKD V --> ESRD: with dysgeusia/ anorexia, increased hypervolemia. TDC placed 10/25. HD #1 10/25, #2 10/26. #3 on 10/28.  Pending outpatient placement per SW/CM. - Next treatment on Wed unless outpatient placement plan dictates otherwise. DC lasix.   #  Anemia:  Last hgb 8.7, monitor. 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 Renal diet.  Calcium, phosphorus level acceptable.   #  HTN/volume: DC Lasix, on metoprolol.  Monitor BP.  UF as tolerated  # Dispo: admitted, plan for SNF on d/c, outpatient HD placement per SW/CM.  Subjective: Seen and examined at bedside.  Denies nausea, vomiting, chest pain, shortness of breath.  Tolerating dialysis well. Objective Vital signs in last 24 hours: Vitals:   08/18/23 2200 08/18/23 2208 08/19/23 0400 08/19/23 0500  BP: 114/73  104/62   Pulse: 97 97 83   Resp: 18  18   Temp: 98.9 F (37.2 C)  (!) 97.5 F (36.4 C)   TempSrc: Oral  Oral   SpO2: 98%     Weight:    89.2 kg  Height:       Weight change: 0.1 kg  Intake/Output Summary (Last 24 hours) at 08/19/2023 0923 Last data filed at 08/19/2023 0400 Gross per 24 hour  Intake --  Output 1300 ml  Net -1300 ml       Labs: RENAL PANEL Recent Labs  Lab 08/15/23 0424 08/16/23 0428 08/17/23 0426 08/18/23 0459 08/19/23 0428  NA 134* 135 132* 131* 131*  K 4.6 4.0 3.9 3.8 3.7  CL 105 102 97* 95* 95*  CO2 19* 23 26 22 27   GLUCOSE 70 129* 116* 119* 103*  BUN 64* 51* 39* 55* 39*  CREATININE 5.75* 4.73* 4.09* 4.96* 3.73*  CALCIUM 9.0 8.9 8.9 8.9 8.7*  PHOS 6.0* 4.4 4.3 4.7* 3.3  ALBUMIN 3.6 3.5 3.4* 3.4* 3.2*    Liver Function Tests: Recent Labs  Lab 08/14/23 0515 08/15/23 0424 08/17/23 0426 08/18/23 0459 08/19/23 0428  AST 16  --   --   --   --   ALT 16  --   --   --   --   ALKPHOS 117  --   --   --   --   BILITOT 0.5  --   --   --   --   PROT 6.9  --   --   --   --    ALBUMIN 3.6   < > 3.4* 3.4* 3.2*   < > = values in this interval not displayed.   No results for input(s): "LIPASE", "AMYLASE" in the last 168 hours. No results for input(s): "AMMONIA" in the last 168 hours. CBC: Recent Labs    08/14/23 0515 08/15/23 0424 08/15/23 1036 08/18/23 1513  HGB 8.3* 8.6* 8.6* 8.7*  MCV 91.8 90.3 91.6 100.7*  VITAMINB12  --  275  --   --   FOLATE  --  3.8*  --   --   FERRITIN  --  188  --   --   TIBC  --  289  --   --   IRON  --  86  --   --   RETICCTPCT  --  0.8  --   --     Cardiac Enzymes: No results for input(s): "CKTOTAL", "CKMB", "CKMBINDEX", "TROPONINI" in the last 168 hours. CBG: Recent Labs  Lab  08/18/23 0733 08/18/23 1138 08/18/23 1608 08/18/23 2110 08/19/23 0714  GLUCAP 127* 164* 146* 289* 113*    Iron Studies: No results for input(s): "IRON", "TIBC", "TRANSFERRIN", "FERRITIN" in the last 72 hours. Studies/Results: No results found.  Medications: Infusions:  anticoagulant sodium citrate      Scheduled Medications:  atorvastatin  20 mg Oral Daily   calcitRIOL  0.25 mcg Oral Daily   Chlorhexidine Gluconate Cloth  6 each Topical Q0600   darbepoetin (ARANESP) injection - NON-DIALYSIS  60 mcg Subcutaneous Q Fri-1800   dorzolamide-timolol  1 drop Both Eyes BID   feeding supplement (NEPRO CARB STEADY)  237 mL Oral BID BM   folic acid  1 mg Oral Daily   furosemide  80 mg Intravenous BID   heparin  5,000 Units Subcutaneous Q8H   insulin aspart  0-5 Units Subcutaneous QHS   insulin aspart  0-9 Units Subcutaneous TID WC   insulin glargine-yfgn  15 Units Subcutaneous Daily   metoprolol tartrate  12.5 mg Oral BID   multivitamin  1 tablet Oral QHS   polyethylene glycol  17 g Oral Daily   senna-docusate  1 tablet Oral Daily   sodium chloride flush  3 mL Intravenous Q12H   tamsulosin  0.4 mg Oral Q supper    have reviewed scheduled and prn medications.  Physical Exam: General:NAD, comfortable Heart:RRR, s1s2 nl Lungs:clear  b/l, no crackle Abdomen:soft, Non-tender, non-distended Extremities:No edema Dialysis Access: Right IJ TDC in place.  Philip Benjamin Prasad Sarissa Dern 08/19/2023,9:23 AM  LOS: 5 days

## 2023-08-19 NOTE — TOC Progression Note (Signed)
Transition of Care [facility]) - Progression Note    Patient Details  Name: Philip Benjamin MRN: [id number] Date of Birth: 1951/04/01  [facility] of [facility]) CM/SW Contact  Villa Herb, [place] Phone Number: 08/19/2023, 11:08 AM  Clinical Narrative:    CSW spoke to Destiny with [facility] who states that they do not have a bed today but can take pt for SNF tomorrow. CSW updated MD of this. Pt has a chair time with [facility] MWF at 11:30. Pt will need to arrive to first session on Friday at 11am. TOC to follow.   Expected Discharge Plan: Skilled Nursing Facility Barriers to Discharge: Continued Medical Work up  Expected Discharge Plan and Services In-house Referral: Clinical Social Work Discharge Planning Services: CM Consult Post Acute Care Choice: Skilled Nursing Facility Living arrangements for the past 2 months: Single Family Home                                       Social Determinants of Health (SDOH) Interventions SDOH Screenings   Food Insecurity: No Food Insecurity (08/14/2023)  Housing: Low Risk  (08/14/2023)  Transportation Needs: No Transportation Needs (08/14/2023)  Utilities: Not At Risk (08/14/2023)  Tobacco Use: Low Risk  (08/14/2023)    Readmission Risk Interventions    08/15/2023   12:29 PM  Readmission Risk Prevention Plan  Transportation Screening Complete  Home Care Screening Complete  Medication Review (RN CM) Complete

## 2023-08-19 NOTE — Progress Notes (Signed)
Physical Therapy Treatment Patient Details Name: Philip Benjamin MRN: [id number] DOB: Apr 22, 1951 Today's Date: 08/19/2023   History of Present Illness Philip Benjamin is a 72 y.o. male with a history of HTN, T2DM, HLD, CKD who presented to the ED 10/24 with progressive leg swelling and weakness. He and his wife give history, several weeks of worsening tiredness/fatigue. He's been told he'd need to follow up with vascular surgery and consented to dialysis with outpatient nephrology, Dr. Wynelle Link. He is a limited historian, though he reports he's been eating and drinking less, this morning he was unable to get up due to weakness that was new. His daughter is an Charity fundraiser at this hospital she is not at bedside. He denies any pain currently. States he makes a lot of urine with the lasix. Has no pains to speak of, no dyspnea currently.      He was found to have progression of advanced CKD on labs, appeared volume overloaded, so nephrology was consulted to initiate dialysis and the patient was admitted to [facility].    PT Comments  Pt friendly and willing to participate.  Presents with increased ease with bed mobility and transfer with mod I, just cueing for hand placement to assist with standing.  Increased distance with gait training, used RW for safety during gait with no LOB episodes, Lt knee did buckle occasionally.  Upon return to room pt left in chair where seated LE strengthening exercises complete.  EOS pt left in chair with call bell within reach and OT in room for evaluation.  No reports of pain through session.       If plan is discharge home, recommend the following:     Can travel by private vehicle        Equipment Recommendations       Recommendations for Other Services       Precautions / Restrictions Precautions Precautions: Fall Restrictions Weight Bearing Restrictions: No     Mobility  Bed Mobility Overal bed mobility: Modified Independent       Supine to sit: Supervision      General bed mobility comments: good return for sitting up at bedside with labored movement    Transfers Overall transfer level: Modified independent Equipment used: Rolling walker (2 wheels)   Sit to Stand: Contact guard assist, Min assist           General transfer comment: cueing for hand placement to assist with STS    Ambulation/Gait Ambulation/Gait assistance: Min assist Gait Distance (Feet): 30 Feet Assistive device: Rolling walker (2 wheels) Gait Pattern/deviations: Decreased step length - right, Decreased step length - left, Decreased stride length, Trunk flexed, Knees buckling Gait velocity: slow     General Gait Details: increased distance, slow labored movements with no LOB, does have some buckling Lt knee, RW assisted with stability while standing/gait   Stairs             Wheelchair Mobility     Tilt Bed    Modified Rankin (Stroke Patients Only)       Balance                                            Cognition Arousal: Alert Behavior During Therapy: WFL for tasks assessed/performed Overall Cognitive Status: Within Functional Limits for tasks assessed  Exercises General Exercises - Lower Extremity Long Arc Quad: Seated, AROM, Strengthening, Both, 10 reps Hip ABduction/ADduction: AROM, Both, Seated, Strengthening Hip Flexion/Marching: Seated, AROM, Strengthening, Both, 10 reps Toe Raises: Seated, AROM, Strengthening, Both, 10 reps Heel Raises: Seated, AROM, Strengthening, Both, 10 reps    General Comments        Pertinent Vitals/Pain Pain Assessment Pain Assessment: No/denies pain    Home Living                          Prior Function            PT Goals (current goals can now be found in the care plan section)      Frequency           PT Plan      Co-evaluation              AM-PAC PT "6 Clicks" Mobility   Outcome  Measure  Help needed turning from your back to your side while in a flat bed without using bedrails?: None Help needed moving from lying on your back to sitting on the side of a flat bed without using bedrails?: A Little Help needed moving to and from a bed to a chair (including a wheelchair)?: A Little Help needed standing up from a chair using your arms (e.g., wheelchair or bedside chair)?: A Little Help needed to walk in hospital room?: A Little Help needed climbing 3-5 steps with a railing? : A Lot 6 Click Score: 18    End of Session Equipment Utilized During Treatment: Gait belt Activity Tolerance: Patient tolerated treatment well;Patient limited by fatigue Patient left: in chair;with call bell/phone within reach;with chair alarm set Nurse Communication: Mobility status PT Visit Diagnosis: Unsteadiness on feet (R26.81);Other abnormalities of gait and mobility (R26.89);Muscle weakness (generalized) (M62.81)     Time: 5409-8119 PT Time Calculation (min) (ACUTE ONLY): 17 min  Charges:    $Therapeutic Activity: 8-22 mins PT General Charges $$ ACUTE PT VISIT: 1 Visit                     [name], LPTA/CLT; CBIS [phone]  Juel Burrow 08/19/2023, 9:00 AM

## 2023-08-19 NOTE — Plan of Care (Signed)
Pt on fluid restrictions, complying with physician's order without incident. Pt resting comfortably, watching television. Call light given if further assistance is requested

## 2023-08-19 NOTE — Plan of Care (Signed)
  Problem: Acute Rehab OT Goals (only OT should resolve) Goal: Pt. Will Perform Grooming Flowsheets (Taken 08/19/2023 0933) Pt Will Perform Grooming:  with modified independence  standing Goal: Pt. Will Perform Lower Body Dressing Flowsheets (Taken 08/19/2023 0933) Pt Will Perform Lower Body Dressing:  with modified independence  sit to/from stand  sitting/lateral leans Goal: Pt. Will Transfer To Toilet Flowsheets (Taken 08/19/2023 [phone]) Pt Will Transfer to Toilet:  with modified independence  ambulating Goal: Pt. Will Perform Toileting-Clothing Manipulation Flowsheets (Taken 08/19/2023 0933) Pt Will Perform Toileting - Clothing Manipulation and hygiene: with modified independence Goal: Pt/Caregiver Will Perform Home Exercise Program Flowsheets (Taken 08/19/2023 [phone]) Pt/caregiver will Perform Home Exercise Program:  Increased strength  Both right and left upper extremity  Independently  Mckensie Scotti OT, MOT

## 2023-08-19 NOTE — Evaluation (Signed)
Occupational Therapy Evaluation Patient Details Name: Philip Benjamin MRN: [id number] DOB: 11/04/1950 Today's Date: 08/19/2023   History of Present Illness Philip Benjamin is a 72 y.o. male with a history of HTN, T2DM, HLD, CKD who presented to the ED 10/24 with progressive leg swelling and weakness. He and his wife give history, several weeks of worsening tiredness/fatigue. He's been told he'd need to follow up with vascular surgery and consented to dialysis with outpatient nephrology, Dr. Wynelle Link. He is a limited historian, though he reports he's been eating and drinking less, this morning he was unable to get up due to weakness that was new. His daughter is an Charity fundraiser at this hospital she is not at bedside. He denies any pain currently. States he makes a lot of urine with the lasix. Has no pains to speak of, no dyspnea currently.      He was found to have progression of advanced CKD on labs, appeared volume overloaded, so nephrology was consulted to initiate dialysis and the patient was admitted to [facility].   Clinical Impression   Pt agreeable to OT evaluation as physical therapy just finished their session with the pt. Pt was able to doff and on socks seated in the recliner and ambulate in the room with CGA using RW. Pt demonstrates general weakness but WFL A/ROM of B UE. Pt was independent at baseline. Pt left in the chair with call bell within reach. Pt will benefit from continued OT in the hospital and recommended venue below to increase strength, balance, and endurance for safe ADL's.          If plan is discharge home, recommend the following: A lot of help with walking and/or transfers;A little help with bathing/dressing/bathroom;Assistance with cooking/housework;Assist for transportation    Functional Status Assessment  Patient has had a recent decline in their functional status and demonstrates the ability to make significant improvements in function in a reasonable and predictable amount  of time.  Equipment Recommendations  None recommended by OT    Recommendations for Other Services       Precautions / Restrictions Precautions Precautions: Fall Restrictions Weight Bearing Restrictions: No      Mobility Bed Mobility               General bed mobility comments: seated in chair at start of session    Transfers Overall transfer level: Needs assistance Equipment used: Rolling walker (2 wheels) Transfers: Sit to/from Stand, Bed to chair/wheelchair/BSC Sit to Stand: Contact guard assist     Step pivot transfers: Contact guard assist, Supervision     General transfer comment: Mild labored movement      Balance Overall balance assessment: Needs assistance Sitting-balance support: Feet supported, Bilateral upper extremity supported Sitting balance-Leahy Scale: Good Sitting balance - Comments: seated in chair   Standing balance support: Bilateral upper extremity supported, During functional activity Standing balance-Leahy Scale: Fair Standing balance comment: using RW                           ADL either performed or assessed with clinical judgement   ADL Overall ADL's : Needs assistance/impaired     Grooming: Supervision/safety;Contact guard assist;Standing           Upper Body Dressing : Set up;Sitting   Lower Body Dressing: Modified independent;Sitting/lateral leans Lower Body Dressing Details (indicate cue type and reason): Able to doff and don socks seated in the chair with extended time. Toilet Transfer:  Contact guard assist;Rolling walker (2 wheels);Ambulation Toilet Transfer Details (indicate cue type and reason): Simualted via ambulation in the room. Toileting- Clothing Manipulation and Hygiene: Set up;Sitting/lateral lean       Functional mobility during ADLs: Contact guard assist;Rolling walker (2 wheels)       Vision Baseline Vision/History: 1 Wears glasses;3 Glaucoma Ability to See in Adequate Light: 1  Impaired Patient Visual Report: No change from baseline Vision Assessment?: No apparent visual deficits     Perception Perception: Not tested       Praxis Praxis: Not tested       Pertinent Vitals/Pain Pain Assessment Pain Assessment: Faces Faces Pain Scale: No hurt     Extremity/Trunk Assessment Upper Extremity Assessment Upper Extremity Assessment: Generalized weakness   Lower Extremity Assessment Lower Extremity Assessment: Defer to PT evaluation   Cervical / Trunk Assessment Cervical / Trunk Assessment: Normal   Communication Communication Communication: No apparent difficulties   Cognition Arousal: Alert Behavior During Therapy: WFL for tasks assessed/performed Overall Cognitive Status: Within Functional Limits for tasks assessed                                                        Home Living Family/patient expects to be discharged to:: Private residence Living Arrangements: Spouse/significant other Available Help at Discharge: Family;Available PRN/intermittently Type of Home: House Home Access: Stairs to enter [employer] of Steps: 5 Entrance Stairs-Rails: None Home Layout: Two level;Laundry or work area in basement;Able to live on main level with bedroom/bathroom;Full bath on main level Alternate Level Stairs-Number of Steps: 5 Alternate Level Stairs-Rails: Right Bathroom Shower/Tub: Tub/shower unit   Bathroom Toilet: Standard Bathroom Accessibility: Yes   Home Equipment: Agricultural consultant (2 wheels);Shower seat;Hand held shower head   Additional Comments: per PT note      Prior Functioning/Environment Prior Level of Function : Independent/Modified Independent             Mobility Comments: Household and short distanced community ambulation without AD ADLs Comments: Independent        OT Problem List: Decreased strength;Decreased activity tolerance;Impaired balance (sitting and/or standing)      OT  Treatment/Interventions: Self-care/ADL training;Therapeutic exercise;Therapeutic activities;Patient/family education;Balance training    OT Goals(Current goals can be found in the care plan section) Acute Rehab OT Goals Patient Stated Goal: Get out of hospital. OT Goal Formulation: With patient Time For Goal Achievement: 09/02/23 Potential to Achieve Goals: Good  OT Frequency: Min 2X/week    Co-evaluation                               End of Session Equipment Utilized During Treatment: Rolling walker (2 wheels)  Activity Tolerance: Patient tolerated treatment well Patient left: in chair;with call bell/phone within reach  OT Visit Diagnosis: Unsteadiness on feet (R26.81);Other abnormalities of gait and mobility (R26.89);Muscle weakness (generalized) (M62.81)                Time: 6213-0865 OT Time Calculation (min): 8 min Charges:  OT General Charges $OT Visit: 1 Visit OT Evaluation $OT Eval Low Complexity: 1 Low  Akbar Sacra OT, MOT   Danie Chandler 08/19/2023, 9:31 AM

## 2023-08-19 NOTE — Progress Notes (Signed)
[facility] PROGRESS NOTE  Philip Benjamin (DOB: 06-13-1951) DGU:[id number] PCP: Philip Peri, MD Outpatient Specialists: Nephrology, Dr. Wynelle Benjamin  Brief Narrative: Philip Benjamin is a 72 y.o. male with a history of HTN, T2DM, HLD, CKD who presented to the ED 10/24 with progressive leg swelling and weakness. He was found to have progressive renal failure. Nephrology was consulted, recommending initiation of hemodialysis. Tunneled dialysis catheter was placed 10/25. HD 10/25, 10/26. Continues to have significant edema, but improving volume status. Will require rehabilitation at SNF which is also pursued. Outpatient hemodialysis position has been obtained, will initiate Friday 11/1.   Subjective: No complaints. Brother at bedside.  Objective: BP 105/62   Pulse 91   Temp 98.6 F (37 C) (Oral)   Resp 17   Ht 6\' 1"  (1.854 m)   Wt 89.2 kg   SpO2 98%   BMI 25.94 kg/m   Gen: No distress Pulm: Clear, nonlabored  CV: RRR, no MRG. Rate in 90's, edema not trace BL LE's GI: Soft, NT, ND, +BS  Neuro: Alert and oriented. No new focal deficits. Ext: Warm, no deformities. Skin: No new rashes, lesions or ulcers on visualized skin. [facility] 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 Bryn Benjamin.  - Appears much closer to euvolemia, so stopping lasix today.   - Continue calcitriol. PTH 119. Vit D is wnl at 50.    Folate deficiency:  - Supplemented and started renal multivitamin.   HTN: BP is decreasing, so medications being titrated.  - Holding home losartan, diltiazem, and decreased metoprolol dosing due to soft BPs after initiating dialysis.     T2DM:  - Continue basal insulin 15u, and SSI, at inpatient goal.  - Continue carb-mod renal diet.    Anemia of CKD:  - Hgb stable, no bleeding. On ESA per nephrology.    NAGMA: Resolved with HD.   HLD:  - Continue statin   BPH:  - Continue tamsulosin   Glaucoma:  - Continue gtt's   Weakness:  - PT/OT >> pursuing SNF placement for rehabilitation  Tyrone Nine, MD [facility] [email or web address] 08/19/2023, 11:51 AM

## 2023-08-20 DIAGNOSIS — N186 End stage renal disease: Secondary | ICD-10-CM

## 2023-08-20 DIAGNOSIS — I10 Essential (primary) hypertension: Secondary | ICD-10-CM | POA: Diagnosis not present

## 2023-08-20 LAB — RENAL FUNCTION PANEL
Albumin: 3.4 g/dL — ABNORMAL LOW (ref 3.5–5.0)
Anion gap: 11 (ref 5–15)
BUN: 51 mg/dL — ABNORMAL HIGH (ref 8–23)
CO2: 26 mmol/L (ref 22–32)
Calcium: 9.1 mg/dL (ref 8.9–10.3)
Chloride: 94 mmol/L — ABNORMAL LOW (ref 98–111)
Creatinine, Ser: 4.46 mg/dL — ABNORMAL HIGH (ref 0.61–1.24)
GFR, Estimated: 13 mL/min — ABNORMAL LOW (ref >60)
Glucose, Bld: 93 mg/dL (ref 70–99)
Phosphorus: 3.8 mg/dL (ref 2.5–4.6)
Potassium: 3.6 mmol/L (ref 3.5–5.1)
Sodium: 131 mmol/L — ABNORMAL LOW (ref 135–145)

## 2023-08-20 LAB — GLUCOSE, CAPILLARY
Glucose-Capillary: 186 mg/dL — ABNORMAL HIGH (ref 70–99)
Glucose-Capillary: 120 mg/dL — ABNORMAL HIGH (ref 70–99)
Glucose-Capillary: 103 mg/dL — ABNORMAL HIGH (ref 70–99)
Glucose-Capillary: 235 mg/dL — ABNORMAL HIGH (ref 70–99)

## 2023-08-20 MED ORDER — HEPARIN SODIUM (PORCINE) 1000 UNIT/ML DIALYSIS: 20.0000 [IU]/kg | INTRAMUSCULAR | Status: DC | PRN

## 2023-08-20 MED ORDER — HEPARIN SODIUM (PORCINE) 1000 UNIT/ML IJ SOLN
INTRAMUSCULAR | Status: AC
  Filled 2023-08-20: qty 4

## 2023-08-20 MED ORDER — FOLIC ACID 1 MG PO TABS: 1.0000 mg | ORAL_TABLET | Freq: Every day | ORAL | Status: DC

## 2023-08-20 MED ORDER — RENA-VITE PO TABS: 1.0000 | ORAL_TABLET | Freq: Every day | ORAL | Status: DC

## 2023-08-20 MED ORDER — METOPROLOL TARTRATE 25 MG PO TABS: 12.5000 mg | ORAL_TABLET | Freq: Two times a day (BID) | ORAL | Status: DC

## 2023-08-20 NOTE — Plan of Care (Signed)
Pt progressing. No signs of discomfort displayed.

## 2023-08-20 NOTE — Progress Notes (Signed)
   HEMODIALYSIS TREATMENT NOTE:  Uneventful session completed using RIJ TDC. Cath dressing was changed; exit site is unremarkable. 1.3 liters removed without interruption in UF.  All blood was returned.    08/20/23 1435  Vitals  Temp 98.1 F (36.7 C)  Temp Source Oral  BP 120/74  MAP (mmHg) 91  BP Location Left Arm  BP Method Automatic  Patient Position (if appropriate) Lying  Pulse Rate 86  Pulse Rate Source Monitor  ECG Heart Rate 87  Resp 16  Oxygen Therapy  SpO2 100 %  O2 Device Room Air  Post Treatment  Dialyzer Clearance Lightly streaked  Hemodialysis Intake (mL) 0 mL  Liters Processed 80.5  Fluid Removed (mL) 1300 mL  Tolerated HD Treatment Yes  Post-Hemodialysis Comments Hemodynamically stable  Hemodialysis Catheter Right Subclavian Double lumen Permanent (Tunneled)  Placement Date/Time: 08/15/23 0820   Serial / Lot #: [id number]  Expiration Date: 04/27/28  Time Out: Correct patient;Correct site;Correct procedure  Maximum sterile barrier precautions: Hand hygiene;Cap;Mask;Sterile gown;Sterile gloves;Large sterile ...  Site Condition No complications  Blue Lumen Status Flushed;Heparin locked;Dead end cap in place  Red Lumen Status Flushed;Heparin locked;Dead end cap in place  Purple Lumen Status N/A  Catheter fill solution Heparin 1000 units/ml  Catheter fill volume (Arterial) 1.9 cc  Catheter fill volume (Venous) 1.9  Dressing Type Transparent;Tube stabilization device  Dressing Status Antimicrobial disc in place;Clean, Dry, Intact  Interventions New dressing  Drainage Description None  Dressing Change Due 08/27/23  Post treatment catheter status Capped and Clamped    Arman Filter, RN AP KDU

## 2023-08-20 NOTE — Discharge Summary (Signed)
Physician Discharge Summary   Patient: Philip Benjamin MRN: [id number] DOB: Aug 25, 1951  Admit date:     08/14/2023  Discharge date: 08/20/23  Discharge Physician: Onalee Hua Kerri Asche   PCP: Kirstie Peri, MD   Recommendations at discharge:   Please follow up with primary care provider within 1-2 weeks  Please repeat BMP and CBC in one week   Hospital Course: 72 y.o. male with a history of HTN, T2DM, HLD, CKD who presented to the ED 10/24 with progressive leg swelling and weakness. He was found to have progressive renal failure. Nephrology was consulted, recommending initiation of hemodialysis. Tunneled dialysis catheter was placed 10/25. HD 10/25, 10/26. Continues to have significant edema, but improving volume status. Will require rehabilitation at SNF which is also pursued. Outpatient hemodialysis position has been obtained, will initiate Friday 11/1.  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 Bryn Gulling.  - Appears much closer to euvolemia, so stopping lasix today.   - Continue calcitriol. PTH 119. Vit D is wnl at 50.    Folate deficiency:  - Supplemented and started renal multivitamin.    HTN: BP is decreasing, so medications being titrated.  - Holding home losartan, diltiazem, and decreased metoprolol dosing due to soft BPs after initiating dialysis.   -d/c losartan and diltiazem -metoprolol decreased   T2DM:  - Continue basal insulin 15u, and SSI, at inpatient goal.  - Continue carb-mod renal diet.    Anemia of CKD:  - Hgb stable in 8 range, no bleeding. On ESA per nephrology.   NAGMA: Resolved with HD.   HLD:  - Continue statin   BPH:  - Continue tamsulosin   Glaucoma:  - Continue home gtt's   Weakness:  - PT/OT >> pursuing SNF placement for rehabilitation      Consultants: IR,  renal Procedures performed: Tunneled HD catheter  Disposition: Skilled nursing facility Diet recommendation:  Renal diet DISCHARGE MEDICATION: Allergies as of 08/20/2023   No Known Allergies      Medication List     STOP taking these medications    baclofen 10 MG tablet Commonly known as: LIORESAL   diltiazem 240 MG 24 hr capsule Commonly known as: TIAZAC   furosemide 20 MG tablet Commonly known as: LASIX   losartan 100 MG tablet Commonly known as: COZAAR   oxyCODONE 5 MG immediate release tablet Commonly known as: Oxy IR/ROXICODONE   Xiidra 5 % Soln Generic drug: Lifitegrast       TAKE these medications    atorvastatin 20 MG tablet Commonly known as: LIPITOR Take 20 mg by mouth daily.   calcitRIOL 0.25 MCG capsule Commonly known as: ROCALTROL Take by mouth.   Cholecalciferol 125 MCG (5000 UT) capsule Take by mouth.   cycloSPORINE 0.05 % ophthalmic emulsion Commonly known as: RESTASIS SMARTSIG:In Eye(s)   dorzolamide-timolol 2-0.5 % ophthalmic solution Commonly known as: COSOPT Place 1 drop into both eyes 2 (two) times daily.   folic acid 1 MG tablet Commonly known as: FOLVITE Take 1 tablet (1 mg total) by mouth daily. Start taking on: August 21, 2023   Lantus 100 UNIT/ML injection Generic drug: insulin glargine Inject 30 Units into the skin daily.   metoprolol tartrate 25 MG tablet Commonly known as: LOPRESSOR Take 0.5 tablets (12.5 mg total) by mouth 2 (two) times daily.  What changed:  how much to take when to take this additional instructions   multivitamin Tabs tablet Take 1 tablet by mouth at bedtime.   nystatin cream Commonly known as: MYCOSTATIN Apply 1 application topically 2 (two) times daily as needed. FOR YEAST OR SKIN RASHES.   tamsulosin 0.4 MG Caps capsule Commonly known as: FLOMAX Take 0.4 mg by mouth daily with supper. What changed: Another medication with the same name was removed. Continue taking this medication,  and follow the directions you see here.        Discharge Exam: Filed Weights   08/19/23 0500 08/20/23 0500 08/20/23 1040  Weight: 89.2 kg 96.7 kg 96.8 kg   HEENT:  [place]/AT, No thrush, no icterus CV:  RRR, no rub, no S3, no S4 Lung:  CTA, no wheeze, no rhonchi Abd:  soft/+BS, NT Ext:  1  + LE edema, no lymphangitis, no synovitis, no rash   Condition at discharge: stable  The results of significant diagnostics from this hospitalization (including imaging, microbiology, ancillary and laboratory) are listed below for reference.   Imaging Studies: IR Fluoro Guide CV Line Right  Result Date: 08/15/2023 INDICATION: Renal failure EXAM: Ultrasound and fluoroscopy guided tunneled hemodialysis catheter placement MEDICATIONS: Ancef 2 g IV; The antibiotic was administered within an appropriate time interval prior to skin puncture. ANESTHESIA/SEDATION: Moderate (conscious) sedation was employed during this procedure. A total of Versed 0.5 mg and Fentanyl 25 mcg was administered intravenously by the radiology nurse. Total intra-service moderate Sedation Time: 10 minutes. The patient's level of consciousness and vital signs were monitored continuously by radiology nursing throughout the procedure under my direct supervision. FLUOROSCOPY: Radiation Exposure Index (as provided by the fluoroscopic device): 3 mGy Kerma COMPLICATIONS: None immediate. PROCEDURE: Informed written consent was obtained from the patient after a thorough discussion of the procedural risks, benefits and alternatives. All questions were addressed. Maximal Sterile Barrier Technique was utilized including caps, mask, sterile gowns, sterile gloves, sterile drape, hand hygiene and skin antiseptic. A timeout was performed prior to the initiation of the procedure. The right internal jugular vein was evaluated with ultrasound and shown to be patent. A permanent ultrasound image was obtained and placed in the patient's medical record. Using  sterile gel and a sterile probe cover, the right internal jugular vein was entered with a 21 ga needle during real time ultrasound guidance. 0.018 inch guidewire placed and 21 ga needle exchanged for transitional dilator set. Utilizing fluoroscopy, 0.035 inch guidewire advanced through the dilator without difficulty. Seriel dilation was performed and peel-away sheath was placed. Attention then turned to the right anterior upper chest. Following local lidocaine administration, the hemodialysis catheter was tunneled from the chest wall to the venotomy site. The catheter was inserted through the peel-away sheath. The tip of the catheter was positioned within the right atrium using fluoroscopic guidance. All lumens of the catheter aspirated and flushed well. The dialysis lumens were locked with Heparin. The catheter was secured to the skin with suture. The insertion site was covered with sterile dressing. IMPRESSION: 23 cm tunneled right IJ hemodialysis catheter is ready for use. Electronically Signed   By: Acquanetta Belling M.D.   On: 08/15/2023 15:59   IR US Guide Vasc Access Right  Result Date: 08/15/2023 INDICATION: Renal failure EXAM: Ultrasound and fluoroscopy guided tunneled hemodialysis catheter placement MEDICATIONS: Ancef 2 g IV; The antibiotic was administered within an appropriate time interval prior to skin puncture. ANESTHESIA/SEDATION: Moderate (conscious) sedation was employed during this procedure. A total of  Versed 0.5 mg and Fentanyl 25 mcg was administered intravenously by the radiology nurse. Total intra-service moderate Sedation Time: 10 minutes. The patient's level of consciousness and vital signs were monitored continuously by radiology nursing throughout the procedure under my direct supervision. FLUOROSCOPY: Radiation Exposure Index (as provided by the fluoroscopic device): 3 mGy Kerma COMPLICATIONS: None immediate. PROCEDURE: Informed written consent was obtained from the patient after a  thorough discussion of the procedural risks, benefits and alternatives. All questions were addressed. Maximal Sterile Barrier Technique was utilized including caps, mask, sterile gowns, sterile gloves, sterile drape, hand hygiene and skin antiseptic. A timeout was performed prior to the initiation of the procedure. The right internal jugular vein was evaluated with ultrasound and shown to be patent. A permanent ultrasound image was obtained and placed in the patient's medical record. Using sterile gel and a sterile probe cover, the right internal jugular vein was entered with a 21 ga needle during real time ultrasound guidance. 0.018 inch guidewire placed and 21 ga needle exchanged for transitional dilator set. Utilizing fluoroscopy, 0.035 inch guidewire advanced through the dilator without difficulty. Seriel dilation was performed and peel-away sheath was placed. Attention then turned to the right anterior upper chest. Following local lidocaine administration, the hemodialysis catheter was tunneled from the chest wall to the venotomy site. The catheter was inserted through the peel-away sheath. The tip of the catheter was positioned within the right atrium using fluoroscopic guidance. All lumens of the catheter aspirated and flushed well. The dialysis lumens were locked with Heparin. The catheter was secured to the skin with suture. The insertion site was covered with sterile dressing. IMPRESSION: 23 cm tunneled right IJ hemodialysis catheter is ready for use. Electronically Signed   By: Acquanetta Belling M.D.   On: 08/15/2023 15:59   DG Chest Port 1 View  Result Date: 08/14/2023 CLINICAL DATA:  Weakness.  Lower extremity edema. EXAM: PORTABLE CHEST 1 VIEW COMPARISON:  05/20/2023 FINDINGS: Stable cardiomediastinal contours. Low lung volumes. Eventration of the hemidiaphragms. Unchanged scarring and volume loss identified within the left base. No airspace consolidation, pleural effusions, or interstitial edema. No  pneumothorax. IMPRESSION: Low lung volumes and left base scarring. No acute findings. Electronically Signed   By: Signa Kell M.D.   On: 08/14/2023 05:29    Microbiology: No results found for this or any previous visit.  Labs: CBC: Recent Labs  Lab 08/14/23 0515 08/15/23 0424 08/15/23 1036 08/18/23 1513  WBC 6.6 5.5 5.5 7.9  HGB 8.3* 8.6* 8.6* 8.7*  HCT 27.0* 27.0* 27.1* 29.4*  MCV 91.8 90.3 91.6 100.7*  PLT 184 192 189 150   Basic Metabolic Panel: Recent Labs  Lab 08/16/23 0428 08/17/23 0426 08/18/23 0459 08/19/23 0428 08/20/23 0421  NA 135 132* 131* 131* 131*  K 4.0 3.9 3.8 3.7 3.6  CL 102 97* 95* 95* 94*  CO2 23 26 22 27 26   GLUCOSE 129* 116* 119* 103* 93  BUN 51* 39* 55* 39* 51*  CREATININE 4.73* 4.09* 4.96* 3.73* 4.46*  CALCIUM 8.9 8.9 8.9 8.7* 9.1  PHOS 4.4 4.3 4.7* 3.3 3.8   Liver Function Tests: Recent Labs  Lab 08/14/23 0515 08/15/23 0424 08/16/23 0428 08/17/23 0426 08/18/23 0459 08/19/23 0428 08/20/23 0421  AST 16  --   --   --   --   --   --   ALT 16  --   --   --   --   --   --   ALKPHOS 117  --   --   --   --   --   --  BILITOT 0.5  --   --   --   --   --   --   PROT 6.9  --   --   --   --   --   --   ALBUMIN 3.6   < > 3.5 3.4* 3.4* 3.2* 3.4*   < > = values in this interval not displayed.   CBG: Recent Labs  Lab 08/19/23 1158 08/19/23 1615 08/19/23 1708 08/19/23 2035 08/20/23 0955  GLUCAP 210* 107* 160* 210* 186*    Discharge time spent: greater than 30 minutes.  Signed: Catarina Hartshorn, MD [facility] 08/20/2023

## 2023-08-20 NOTE — Progress Notes (Signed)
[facility] NEPHROLOGY PROGRESS NOTE  Assessment/ Plan: Pt is a 72 y.o. yo male    # CKD V --> ESRD: with dysgeusia/ anorexia, increased hypervolemia. TDC placed 10/25. HD #1 10/25, #2 10/26. #3 on 10/28.   Plan for regular HD today as per MWF schedule. OP HD arranged with [facility] MWF at 11:30 AM, awaiting SNF placement.   #  Anemia:  Last hgb 8.7, monitor. 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 Renal diet.  Calcium, phosphorus level acceptable.   #  HTN/volume: DC Lasix, on metoprolol.  Monitor BP.  UF as tolerated  # Dispo: admitted, plan for SNF on d/c, outpatient HD placement done.  Subjective: Seen and examined at bedside.  No new event.  Denies nausea, vomiting, chest pain, shortness of breath.  Objective Vital signs in last 24 hours: Vitals:   08/20/23 0345 08/20/23 0500 08/20/23 0800 08/20/23 0914  BP: 131/71  121/69 121/69  Pulse: 79  85 85  Resp: 18     Temp: (!) 96.9 F (36.1 C)     TempSrc: Oral     SpO2:   97%   Weight:  96.7 kg    Height:       Weight change: 1.9 kg  Intake/Output Summary (Last 24 hours) at 08/20/2023 0928 Last data filed at 08/20/2023 0532 Gross per 24 hour  Intake 3 ml  Output 500 ml  Net -497 ml       Labs: RENAL PANEL Recent Labs  Lab 08/16/23 0428 08/17/23 0426 08/18/23 0459 08/19/23 0428 08/20/23 0421  NA 135 132* 131* 131* 131*  K 4.0 3.9 3.8 3.7 3.6  CL 102 97* 95* 95* 94*  CO2 23 26 22 27 26   GLUCOSE 129* 116* 119* 103* 93  BUN 51* 39* 55* 39* 51*  CREATININE 4.73* 4.09* 4.96* 3.73* 4.46*  CALCIUM 8.9 8.9 8.9 8.7* 9.1  PHOS 4.4 4.3 4.7* 3.3 3.8  ALBUMIN 3.5 3.4* 3.4* 3.2* 3.4*    Liver Function Tests: Recent Labs  Lab 08/14/23 0515 08/15/23 0424 08/18/23 0459 08/19/23 0428 08/20/23 0421  AST 16  --   --   --   --   ALT 16  --   --   --   --   ALKPHOS 117  --   --   --   --   BILITOT 0.5  --   --   --   --   PROT 6.9  --   --   --   --   ALBUMIN 3.6   < > 3.4* 3.2* 3.4*    < > = values in this interval not displayed.   No results for input(s): "LIPASE", "AMYLASE" in the last 168 hours. No results for input(s): "AMMONIA" in the last 168 hours. CBC: Recent Labs    08/14/23 0515 08/15/23 0424 08/15/23 1036 08/18/23 1513  HGB 8.3* 8.6* 8.6* 8.7*  MCV 91.8 90.3 91.6 100.7*  VITAMINB12  --  275  --   --   FOLATE  --  3.8*  --   --   FERRITIN  --  188  --   --   TIBC  --  289  --   --   IRON  --  86  --   --   RETICCTPCT  --  0.8  --   --     Cardiac Enzymes: No results for input(s): "CKTOTAL", "CKMB", "CKMBINDEX", "TROPONINI" in the last 168 hours. CBG: Recent  Labs  Lab 08/19/23 0714 08/19/23 1158 08/19/23 1615 08/19/23 1708 08/19/23 2035  GLUCAP 113* 210* 107* 160* 210*    Iron Studies: No results for input(s): "IRON", "TIBC", "TRANSFERRIN", "FERRITIN" in the last 72 hours. Studies/Results: No results found.  Medications: Infusions:  anticoagulant sodium citrate      Scheduled Medications:  atorvastatin  20 mg Oral Daily   calcitRIOL  0.25 mcg Oral Daily   Chlorhexidine Gluconate Cloth  6 each Topical Q0600   Chlorhexidine Gluconate Cloth  6 each Topical Q0600   darbepoetin (ARANESP) injection - NON-DIALYSIS  60 mcg Subcutaneous Q Fri-1800   dorzolamide-timolol  1 drop Both Eyes BID   feeding supplement (NEPRO CARB STEADY)  237 mL Oral BID BM   folic acid  1 mg Oral Daily   heparin  5,000 Units Subcutaneous Q8H   insulin aspart  0-5 Units Subcutaneous QHS   insulin aspart  0-9 Units Subcutaneous TID WC   insulin glargine-yfgn  15 Units Subcutaneous Daily   metoprolol tartrate  12.5 mg Oral BID   multivitamin  1 tablet Oral QHS   polyethylene glycol  17 g Oral Daily   senna-docusate  1 tablet Oral Daily   sodium chloride flush  3 mL Intravenous Q12H   tamsulosin  0.4 mg Oral Q supper    have reviewed scheduled and prn medications.  Physical Exam: General:NAD, comfortable Heart:RRR, s1s2 nl Lungs:clear b/l, no  crackle Abdomen:soft, Non-tender, non-distended Extremities:No edema Dialysis Access: Right IJ TDC in place.  Versie Soave Prasad Akosua Constantine 08/20/2023,9:28 AM  LOS: 6 days

## 2023-08-20 NOTE — Progress Notes (Signed)
Called report to Nurse Gearldine Bienenstock at [facility]. Pt is resting in room awaiting transport. Will continue to monitor.

## 2023-08-20 NOTE — Plan of Care (Signed)
  Problem: Education: Goal: Knowledge of General Education information will improve Description Including pain rating scale, medication(s)/side effects and non-pharmacologic comfort measures Outcome: Progressing   Problem: Health Behavior/Discharge Planning: Goal: Ability to manage health-related needs will improve Outcome: Progressing   

## 2023-08-20 NOTE — TOC Transition Note (Signed)
Transition of Care [facility]) - CM/SW Discharge Note   Patient Details  Name: Philip Benjamin MRN: [id number] Date of Birth: [birth year]  [facility]) CM/SW Contact:  Villa Herb, LCSWA Phone Number: 08/20/2023, 2:38 PM   Clinical Narrative:    CSW spoke to Destiny with [facility] who states they are ready to accept pt today. Pt will have first HD session Friday 11/1 at 11am. CSW sent D/C clinicals to facility. CSW updated pts daughter of plan for D/C today. CSW updated RN of room and report numbers. CSW completed med necessity and sent to floor for RN. CSW to call for EMS when RN is ready. TOC signing off.   Final next level of care: Skilled Nursing Facility Barriers to Discharge: Barriers Resolved   Patient Goals and CMS Choice CMS Medicare.gov Compare Post Acute Care list provided to:: Patient Represenative (must comment) Choice offered to / list presented to : Adult Children  Discharge Placement                  Patient to be transferred to facility by: EMS Name of family member notified: daughter Patient and family notified of of transfer: 08/20/23  Discharge Plan and Services Additional resources added to the After Visit Summary for   In-house Referral: Clinical Social Work Discharge Planning Services: CM Consult Post Acute Care Choice: Skilled Nursing Facility                               Social Determinants of Health (SDOH) Interventions SDOH Screenings   Food Insecurity: No Food Insecurity (08/14/2023)  Housing: Low Risk  (08/14/2023)  Transportation Needs: No Transportation Needs (08/14/2023)  Utilities: Not At Risk (08/14/2023)  Tobacco Use: Low Risk  (08/14/2023)     Readmission Risk Interventions    08/15/2023   12:29 PM  Readmission Risk Prevention Plan  Transportation Screening Complete  Home Care Screening Complete  Medication Review (RN CM) Complete

## 2023-08-21 ENCOUNTER — Encounter (HOSPITAL_COMMUNITY): Payer: Self-pay

## 2023-08-21 ENCOUNTER — Emergency Department (HOSPITAL_COMMUNITY): Payer: Medicare Other

## 2023-08-21 ENCOUNTER — Other Ambulatory Visit: Payer: Self-pay

## 2023-08-21 ENCOUNTER — Inpatient Hospital Stay (HOSPITAL_COMMUNITY)
Admission: EM | Admit: 2023-08-21 | Discharge: 2023-08-25 | Disposition: A | Discharge status: Skilled Nursing Facility | Payer: Medicare Other | Source: Skilled Nursing Facility | Attending: Internal Medicine | Admitting: Internal Medicine

## 2023-08-21 DIAGNOSIS — Y95 Nosocomial condition: Secondary | ICD-10-CM | POA: Diagnosis present

## 2023-08-21 DIAGNOSIS — E8770 Fluid overload, unspecified: Secondary | ICD-10-CM | POA: Diagnosis present

## 2023-08-21 DIAGNOSIS — D631 Anemia in chronic kidney disease: Secondary | ICD-10-CM | POA: Diagnosis present

## 2023-08-21 DIAGNOSIS — N200 Calculus of kidney: Secondary | ICD-10-CM | POA: Diagnosis present

## 2023-08-21 DIAGNOSIS — N390 Urinary tract infection, site not specified: Secondary | ICD-10-CM

## 2023-08-21 DIAGNOSIS — F419 Anxiety disorder, unspecified: Secondary | ICD-10-CM | POA: Diagnosis present

## 2023-08-21 DIAGNOSIS — E871 Hypo-osmolality and hyponatremia: Secondary | ICD-10-CM | POA: Diagnosis present

## 2023-08-21 DIAGNOSIS — D696 Thrombocytopenia, unspecified: Secondary | ICD-10-CM | POA: Diagnosis not present

## 2023-08-21 DIAGNOSIS — J181 Lobar pneumonia, unspecified organism: Secondary | ICD-10-CM | POA: Diagnosis present

## 2023-08-21 DIAGNOSIS — D6959 Other secondary thrombocytopenia: Secondary | ICD-10-CM | POA: Diagnosis present

## 2023-08-21 DIAGNOSIS — N186 End stage renal disease: Secondary | ICD-10-CM | POA: Diagnosis present

## 2023-08-21 DIAGNOSIS — Z1152 Encounter for screening for COVID-19: Secondary | ICD-10-CM

## 2023-08-21 DIAGNOSIS — E1122 Type 2 diabetes mellitus with diabetic chronic kidney disease: Secondary | ICD-10-CM | POA: Diagnosis present

## 2023-08-21 DIAGNOSIS — E1165 Type 2 diabetes mellitus with hyperglycemia: Secondary | ICD-10-CM | POA: Diagnosis present

## 2023-08-21 DIAGNOSIS — H409 Unspecified glaucoma: Secondary | ICD-10-CM | POA: Diagnosis present

## 2023-08-21 DIAGNOSIS — R6520 Severe sepsis without septic shock: Secondary | ICD-10-CM | POA: Diagnosis present

## 2023-08-21 DIAGNOSIS — A4152 Sepsis due to Pseudomonas: Principal | ICD-10-CM | POA: Insufficient documentation

## 2023-08-21 DIAGNOSIS — M898X9 Other specified disorders of bone, unspecified site: Secondary | ICD-10-CM | POA: Diagnosis present

## 2023-08-21 DIAGNOSIS — R7401 Elevation of levels of liver transaminase levels: Secondary | ICD-10-CM | POA: Insufficient documentation

## 2023-08-21 DIAGNOSIS — E782 Mixed hyperlipidemia: Secondary | ICD-10-CM | POA: Diagnosis present

## 2023-08-21 DIAGNOSIS — R197 Diarrhea, unspecified: Secondary | ICD-10-CM | POA: Diagnosis present

## 2023-08-21 DIAGNOSIS — I120 Hypertensive chronic kidney disease with stage 5 chronic kidney disease or end stage renal disease: Secondary | ICD-10-CM | POA: Diagnosis present

## 2023-08-21 DIAGNOSIS — A419 Sepsis, unspecified organism: Secondary | ICD-10-CM | POA: Diagnosis not present

## 2023-08-21 DIAGNOSIS — J189 Pneumonia, unspecified organism: Secondary | ICD-10-CM | POA: Diagnosis not present

## 2023-08-21 DIAGNOSIS — Z79899 Other long term (current) drug therapy: Secondary | ICD-10-CM

## 2023-08-21 DIAGNOSIS — Z992 Dependence on renal dialysis: Secondary | ICD-10-CM

## 2023-08-21 DIAGNOSIS — I10 Essential (primary) hypertension: Secondary | ICD-10-CM | POA: Diagnosis present

## 2023-08-21 DIAGNOSIS — E538 Deficiency of other specified B group vitamins: Secondary | ICD-10-CM | POA: Diagnosis present

## 2023-08-21 DIAGNOSIS — N400 Enlarged prostate without lower urinary tract symptoms: Secondary | ICD-10-CM | POA: Diagnosis present

## 2023-08-21 DIAGNOSIS — R509 Fever, unspecified: Principal | ICD-10-CM

## 2023-08-21 DIAGNOSIS — N3091 Cystitis, unspecified with hematuria: Secondary | ICD-10-CM | POA: Insufficient documentation

## 2023-08-21 DIAGNOSIS — Z794 Long term (current) use of insulin: Secondary | ICD-10-CM

## 2023-08-21 LAB — URINALYSIS, ROUTINE W REFLEX MICROSCOPIC
Bilirubin Urine: NEGATIVE
Glucose, UA: NEGATIVE mg/dL
Ketones, ur: NEGATIVE mg/dL
Nitrite: NEGATIVE
Protein, ur: 100 mg/dL — AB
Specific Gravity, Urine: 1.01 (ref 1.005–1.030)
pH: 7 (ref 5.0–8.0)

## 2023-08-21 LAB — CBC WITH DIFFERENTIAL/PLATELET
Abs Immature Granulocytes: 0.19 10*3/uL — ABNORMAL HIGH (ref 0.00–0.07)
Basophils Absolute: 0.1 10*3/uL (ref 0.0–0.1)
Basophils Relative: 0 %
Eosinophils Absolute: 0 10*3/uL (ref 0.0–0.5)
Eosinophils Relative: 0 %
HCT: 30.1 % — ABNORMAL LOW (ref 39.0–52.0)
Hemoglobin: 9.3 g/dL — ABNORMAL LOW (ref 13.0–17.0)
Immature Granulocytes: 1 %
Lymphocytes Relative: 2 %
Lymphs Abs: 0.4 10*3/uL — ABNORMAL LOW (ref 0.7–4.0)
MCH: 28.5 pg (ref 26.0–34.0)
MCHC: 30.9 g/dL (ref 30.0–36.0)
MCV: 92.3 fL (ref 80.0–100.0)
Monocytes Absolute: 2.3 10*3/uL — ABNORMAL HIGH (ref 0.1–1.0)
Monocytes Relative: 12 %
Neutro Abs: 17 10*3/uL — ABNORMAL HIGH (ref 1.7–7.7)
Neutrophils Relative %: 85 %
Platelets: 135 10*3/uL — ABNORMAL LOW (ref 150–400)
RBC: 3.26 MIL/uL — ABNORMAL LOW (ref 4.22–5.81)
RDW: 12.7 % (ref 11.5–15.5)
WBC: 20 10*3/uL — ABNORMAL HIGH (ref 4.0–10.5)
nRBC: 0 % (ref 0.0–0.2)

## 2023-08-21 LAB — COMPREHENSIVE METABOLIC PANEL
ALT: 45 U/L — ABNORMAL HIGH (ref 0–44)
AST: 47 U/L — ABNORMAL HIGH (ref 15–41)
Albumin: 3.8 g/dL (ref 3.5–5.0)
Alkaline Phosphatase: 118 U/L (ref 38–126)
Anion gap: 16 — ABNORMAL HIGH (ref 5–15)
BUN: 38 mg/dL — ABNORMAL HIGH (ref 8–23)
CO2: 23 mmol/L (ref 22–32)
Calcium: 9.2 mg/dL (ref 8.9–10.3)
Chloride: 95 mmol/L — ABNORMAL LOW (ref 98–111)
Creatinine, Ser: 3.71 mg/dL — ABNORMAL HIGH (ref 0.61–1.24)
GFR, Estimated: 16 mL/min — ABNORMAL LOW (ref >60)
Glucose, Bld: 163 mg/dL — ABNORMAL HIGH (ref 70–99)
Potassium: 3.8 mmol/L (ref 3.5–5.1)
Sodium: 134 mmol/L — ABNORMAL LOW (ref 135–145)
Total Bilirubin: 0.8 mg/dL (ref 0.3–1.2)
Total Protein: 7.2 g/dL (ref 6.5–8.1)

## 2023-08-21 LAB — RESP PANEL BY RT-PCR (RSV, FLU A&B, COVID)  RVPGX2
Influenza A by PCR: NEGATIVE
Influenza B by PCR: NEGATIVE
Resp Syncytial Virus by PCR: NEGATIVE
SARS Coronavirus 2 by RT PCR: NEGATIVE

## 2023-08-21 LAB — LACTIC ACID, PLASMA
Lactic Acid, Venous: 2 mmol/L (ref 0.5–1.9)
Lactic Acid, Venous: 1.4 mmol/L (ref 0.5–1.9)

## 2023-08-21 LAB — BRAIN NATRIURETIC PEPTIDE: B Natriuretic Peptide: 91 pg/mL (ref 0.0–100.0)

## 2023-08-21 MED ORDER — ACETAMINOPHEN 325 MG PO TABS
650.0000 mg | ORAL_TABLET | Freq: Four times a day (QID) | ORAL | Status: DC | PRN
  Administered 2023-08-22: 650 mg via ORAL
  Filled 2023-08-21: qty 2

## 2023-08-21 MED ORDER — SODIUM CHLORIDE 0.9 % IV SOLN
2.0000 g | Freq: Once | INTRAVENOUS | Status: AC
  Administered 2023-08-21: 2 g via INTRAVENOUS
  Filled 2023-08-21: qty 12.5

## 2023-08-21 MED ORDER — VANCOMYCIN HCL IN DEXTROSE 1-5 GM/200ML-% IV SOLN
1000.0000 mg | INTRAVENOUS | Status: DC | PRN
  Administered 2023-08-22: 1000 mg via INTRAVENOUS
  Filled 2023-08-21: qty 200

## 2023-08-21 MED ORDER — ACETAMINOPHEN 650 MG RE SUPP: 650.0000 mg | Freq: Four times a day (QID) | RECTAL | Status: DC | PRN

## 2023-08-21 MED ORDER — VANCOMYCIN HCL IN DEXTROSE 1-5 GM/200ML-% IV SOLN
1000.0000 mg | Freq: Once | INTRAVENOUS | Status: AC
  Administered 2023-08-21: 1000 mg via INTRAVENOUS
  Filled 2023-08-21: qty 200

## 2023-08-21 MED ORDER — LACTATED RINGERS IV BOLUS
1000.0000 mL | Freq: Once | INTRAVENOUS | Status: AC
  Administered 2023-08-21: 1000 mL via INTRAVENOUS

## 2023-08-21 MED ORDER — HEPARIN SODIUM (PORCINE) 5000 UNIT/ML IJ SOLN
5000.0000 [IU] | Freq: Three times a day (TID) | INTRAMUSCULAR | Status: DC
  Administered 2023-08-22 – 2023-08-25 (×9): 5000 [IU] via SUBCUTANEOUS
  Filled 2023-08-21 (×9): qty 1

## 2023-08-21 MED ORDER — ONDANSETRON HCL 4 MG/2ML IJ SOLN
4.0000 mg | Freq: Four times a day (QID) | INTRAMUSCULAR | Status: DC | PRN
  Administered 2023-08-22 – 2023-08-24 (×3): 4 mg via INTRAVENOUS
  Filled 2023-08-21 (×3): qty 2

## 2023-08-21 MED ORDER — ONDANSETRON HCL 4 MG PO TABS: 4.0000 mg | ORAL_TABLET | Freq: Four times a day (QID) | ORAL | Status: DC | PRN

## 2023-08-21 MED ORDER — DM-GUAIFENESIN ER 30-600 MG PO TB12
1.0000 | ORAL_TABLET | Freq: Two times a day (BID) | ORAL | Status: DC
  Administered 2023-08-22 – 2023-08-25 (×8): 1 via ORAL
  Filled 2023-08-21 (×8): qty 1

## 2023-08-21 NOTE — ED Notes (Signed)
Cbc needs to be recollected due to it clotting per lab

## 2023-08-21 NOTE — ED Triage Notes (Signed)
Pt BIB [facility] from [facility]. Pt complains of fever. Pt had recent port placement.

## 2023-08-21 NOTE — ED Notes (Signed)
Pt verbally consented to treatment today.

## 2023-08-21 NOTE — H&P (Signed)
History and Physical    Patient: Philip Benjamin:[id number] DOB: [birth year] DOA: 08/21/2023 DOS: the patient was seen and examined on 08/22/2023 PCP: Kirstie Peri, MD  Patient coming from: SNF  Chief Complaint:  Chief Complaint  Patient presents with   Fever   HPI: Philip Benjamin is a 72 y.o. 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  Patient was recently admitted on 10/24 and discharged on 10/30 (yesterday) to a SNF [facility] rehab) due to CKD 5 which advanced to ESRD.  Patient was noted to be febrile today and also complained of " trouble getting his urine out ", but denies chest pain, shortness of breath, nausea, vomiting, abdominal pain.  EMS was activated and patient was taken to the ED for further evaluation and management.  ED Course:  In the emergency department, patient was febrile with a temperature of 100.4 F, tachypneic with respiratory rate of 21/min, tachycardic with HR of 129 bpm, BP was 145/77 and O2 sat was 91% on room air.  Workup in the ED showed leukocytosis with a left shift, normocytic anemia and thrombocytopenia, lactic acid 2.0 > 1.4, urinalysis was positive for large leukocytes and 21-50 WBC.  Blood culture was pending.  Influenza A, B, SARS 2, RSV was negative. CT chest, abdomen and pelvis without contrast showed mild posteromedial left lower lobe atelectasis and/or infiltrate. She was treated with IV vancomycin and cefepime.  Hospitalist was asked to admit patient for further evaluation and management.  Review of Systems: Review of systems as noted in the HPI. All other systems reviewed and are negative.   Past Medical History:  Diagnosis Date   BPH (benign prostatic hyperplasia)    Chronic kidney disease    ckd stage 3   Diabetes mellitus without complication (HCC)    Hypertension    Past Surgical History:  Procedure Laterality Date    EYE SURGERY     INGUINAL HERNIA REPAIR Bilateral 01/23/2018   Procedure: BILATERAL OPEN INGUINAL HERNIA REPAIR WITH MESH;  Surgeon: Jimmye Norman, MD;  Location: [facility];  Service: General;  Laterality: Bilateral;   INSERTION OF MESH Bilateral 01/23/2018   Procedure: INSERTION OF MESH;  Surgeon: Jimmye Norman, MD;  Location: [facility] OR;  Service: General;  Laterality: Bilateral;   IR FLUORO GUIDE CV LINE RIGHT  08/15/2023   IR US GUIDE VASC ACCESS RIGHT  08/15/2023   VASECTOMY     ~1985    Social History:  reports that he has never smoked. He has never used smokeless tobacco. He reports that he does not drink alcohol and does not use drugs.   No Known Allergies  History reviewed. No pertinent family history.    Prior to Admission medications   Medication Sig Start Date End Date Taking? Authorizing Provider  acetaminophen (TYLENOL) 650 MG CR tablet Take 650 mg by mouth every 6 (six) hours as needed for pain.   Yes [provider]  Cholecalciferol 125 MCG (5000 UT) capsule Take by mouth.   Yes [provider]  cycloSPORINE (RESTASIS) 0.05 % ophthalmic emulsion SMARTSIG:In Eye(s) 08/13/23  Yes [provider]  Dorzolamide HCl-Timolol Mal PF 2-0.5 % SOLN Apply 1 drop to eye 2 (two) times daily. 08/18/23  Yes [provider]  folic acid (FOLVITE) 800 MCG tablet Take 800 mcg by mouth daily.   Yes [provider]  Insulin Glargine (BASAGLAR TEMPO PEN [place]) Inject 30 Units into  the skin at bedtime. Basaglar/Lantus   Yes [provider]  metoprolol tartrate (LOPRESSOR) 25 MG tablet Take 0.5 tablets (12.5 mg total) by mouth 2 (two) times daily. 08/20/23  Yes Tat, Onalee Hua, MD  multivitamin (RENA-VIT) TABS tablet Take 1 tablet by mouth at bedtime. 08/20/23  Yes Tat, Onalee Hua, MD  nystatin cream (MYCOSTATIN) Apply 1 application topically 2 (two) times daily as needed. FOR YEAST OR SKIN RASHES. 12/16/17  Yes [provider]  tamsulosin (FLOMAX) 0.4 MG CAPS  capsule Take 0.4 mg by mouth daily with supper. 12/16/17  Yes [provider]    Physical Exam: BP 126/70 (BP Location: Right Arm)   Pulse (!) 111   Temp (!) 100.4 F (38 C) (Oral)   Resp 14   Ht 6\' 1"  (1.854 m)   Wt 95.3 kg   SpO2 97%   BMI 27.72 kg/m   General: 72 y.o. year-old male well developed well nourished in no acute distress.  Alert and oriented x3. HEENT: NCAT, EOMI Neck: Supple, trachea medial Cardiovascular: Tachycardia.  Regular rate and rhythm with no rubs or gallops.  No thyromegaly or JVD noted.  No lower extremity edema. 2/4 pulses in all 4 extremities. Respiratory: Clear to auscultation with no wheezes or rales. Good inspiratory effort. Abdomen: Soft, nontender nondistended with normal bowel sounds x4 quadrants. Muskuloskeletal: No cyanosis, clubbing or edema noted bilaterally Neuro: CN II-XII intact, strength 5/5 x 4, sensation, reflexes intact Skin: No ulcerative lesions noted or rashes Psychiatry: Judgement and insight appear normal. Mood is appropriate for condition and setting          Labs on Admission:  Basic Metabolic Panel: Recent Labs  Lab 08/16/23 0428 08/17/23 0426 08/18/23 0459 08/19/23 0428 08/20/23 0421 08/21/23 1826  NA 135 132* 131* 131* 131* 134*  K 4.0 3.9 3.8 3.7 3.6 3.8  CL 102 97* 95* 95* 94* 95*  CO2 23 26 22 27 26 23   GLUCOSE 129* 116* 119* 103* 93 163*  BUN 51* 39* 55* 39* 51* 38*  CREATININE 4.73* 4.09* 4.96* 3.73* 4.46* 3.71*  CALCIUM 8.9 8.9 8.9 8.7* 9.1 9.2  PHOS 4.4 4.3 4.7* 3.3 3.8  --    Liver Function Tests: Recent Labs  Lab 08/17/23 0426 08/18/23 0459 08/19/23 0428 08/20/23 0421 08/21/23 1826  AST  --   --   --   --  47*  ALT  --   --   --   --  45*  ALKPHOS  --   --   --   --  118  BILITOT  --   --   --   --  0.8  PROT  --   --   --   --  7.2  ALBUMIN 3.4* 3.4* 3.2* 3.4* 3.8   No results for input(s): "LIPASE", "AMYLASE" in the last 168 hours. No results for input(s): "AMMONIA" in the last  168 hours. CBC: Recent Labs  Lab 08/15/23 0424 08/15/23 1036 08/18/23 1513 08/21/23 2012  WBC 5.5 5.5 7.9 20.0*  NEUTROABS  --   --   --  17.0*  HGB 8.6* 8.6* 8.7* 9.3*  HCT 27.0* 27.1* 29.4* 30.1*  MCV 90.3 91.6 100.7* 92.3  PLT 192 189 150 135*   Cardiac Enzymes: No results for input(s): "CKTOTAL", "CKMB", "CKMBINDEX", "TROPONINI" in the last 168 hours.  BNP (last 3 results) Recent Labs    08/14/23 0515 08/21/23 1826  BNP 45.0 91.0    ProBNP (last 3 results) No results for input(s): "  PROBNP" in the last 8760 hours.  CBG: Recent Labs  Lab 08/19/23 2035 08/20/23 0955 08/20/23 1236 08/20/23 1609 08/20/23 2115  GLUCAP 210* 186* 120* 103* 235*    Radiological Exams on Admission: CT CHEST ABDOMEN PELVIS WO CONTRAST  Result Date: 08/21/2023 CLINICAL DATA:  Sepsis. EXAM: CT CHEST, ABDOMEN AND PELVIS WITHOUT CONTRAST TECHNIQUE: Multidetector CT imaging of the chest, abdomen and pelvis was performed following the standard protocol without IV contrast. RADIATION DOSE REDUCTION: This exam was performed according to the departmental dose-optimization program which includes automated exposure control, adjustment of the mA and/or kV according to patient size and/or use of iterative reconstruction technique. COMPARISON:  None Available. FINDINGS: CT CHEST FINDINGS Cardiovascular: A right-sided venous catheter is in place. There is mild calcification of the aortic arch, without evidence of aortic aneurysm. Normal heart size with moderate severity coronary artery calcification. No pericardial effusion. Mediastinum/Nodes: No enlarged mediastinal, hilar, or axillary lymph nodes. Thyroid gland, trachea, and esophagus demonstrate no significant findings. Lungs/Pleura: Mild lingular and posterior right basilar linear scarring and/or atelectasis is seen. Mild posteromedial left lower lobe atelectasis and/or infiltrate is noted. There is mild elevation of the left hemidiaphragm. No pleural  effusion or pneumothorax is identified. Musculoskeletal: Multilevel degenerative changes are seen throughout the thoracic spine CT ABDOMEN PELVIS FINDINGS Hepatobiliary: No focal liver abnormality is seen. The gallbladder is moderately distended without evidence of gallstones, gallbladder wall thickening, or biliary dilatation. Pancreas: Unremarkable. No pancreatic ductal dilatation or surrounding inflammatory changes. Spleen: Normal in size without focal abnormality. Adrenals/Urinary Tract: Adrenal glands are unremarkable. Kidneys are normal, without obstructing renal calculi, focal lesion, or hydronephrosis. A 1 mm nonobstructing renal calculus is seen within the mid to upper left kidney. Mild, diffuse urinary bladder wall thickening is noted. Stomach/Bowel: Stomach is within normal limits. Appendix appears normal. No evidence of bowel wall thickening, distention, or inflammatory changes. Noninflamed diverticula are seen throughout the large bowel Vascular/Lymphatic: Aortic atherosclerosis. No enlarged abdominal or pelvic lymph nodes. Reproductive: The prostate gland is mildly enlarged. Other: No abdominal wall hernia or abnormality. No abdominopelvic ascites. Musculoskeletal: Multilevel degenerative changes are seen throughout the lumbar spine. IMPRESSION: 1. Mild posteromedial left lower lobe atelectasis and/or infiltrate. 2. Mild lingular and posterior right basilar linear scarring and/or atelectasis. 3. Colonic diverticulosis. 4. 1 mm nonobstructing left renal calculus. 5. Aortic atherosclerosis. Aortic Atherosclerosis (ICD10-I70.0). Electronically Signed   By: Aram Candela M.D.   On: 08/21/2023 22:41    EKG: I independently viewed the EKG done and my findings are as followed: EKG was not done in the ED  Assessment/Plan Present on Admission:  Sepsis due to urinary tract infection (HCC)  Essential hypertension  Mixed hyperlipidemia  BPH (benign prostatic hyperplasia)  Principal Problem:    Sepsis due to urinary tract infection (HCC) Active Problems:   End-stage renal disease on hemodialysis (HCC)   Essential hypertension   Mixed hyperlipidemia   BPH (benign prostatic hyperplasia)   HCAP (healthcare-associated pneumonia)   Thrombocytopenia (HCC)   Transaminitis   Folate deficiency   Type 2 diabetes mellitus with hyperglycemia (HCC)   Glaucoma   Severe sepsis due to urinary tract infection Patient was febrile, tachypneic, tachycardic and have leukocytosis (met SIRS criteria) and principal source of infection was UTI due to patient complaining of dysuria and urinalysis suspicious for UTI Patient was started on IV vancomycin and cefepime, we shall continue with cefepime at this time Continue Tylenol as needed Urine and blood culture pending  Presumed HCAP POA CT abdomen and pelvis  showed left lower lobe infiltrate Patient was started on vancomycin and cefepime, we shall continue same at this time with plan to de-escalate/discontinue based on blood culture, sputum culture, urine Legionella, strep pneumo and procalcitonin Continue Tylenol as needed Continue Mucinex, incentive spirometry, flutter valve   Thrombocytopenia Platelets at 135, no sign of bleeding Continue to monitor platelet levels  Transaminitis This is possible secondary to shock liver Continue to monitor liver enzymes  ESRD on HD (MWF) Patient was recently started on dialysis.  Nephrology will be consulted  Essential hypertension Continue Lopressor  Type 2 diabetes mellitus with hyperglycemia Continue Semglee 10 units and adjust dose accordingly Continue ISS and hypoglycemia protocol  Mixed hyperlipidemia Continue statin  BPH Continue Flomax  Glaucoma Continue home meds  Folate deficiency Continue folic acid  DVT prophylaxis: Heparin subcu  Advance Care Planning: Full code  Consults: Nephrology  Family Communication: None at bedside  Severity of Illness: The appropriate patient  status for this patient is INPATIENT. Inpatient status is judged to be reasonable and necessary in order to provide the required intensity of service to ensure the patient's safety. The patient's presenting symptoms, physical exam findings, and initial radiographic and laboratory data in the context of their chronic comorbidities is felt to place them at high risk for further clinical deterioration. Furthermore, it is not anticipated that the patient will be medically stable for discharge from the hospital within 2 midnights of admission.   * I certify that at the point of admission it is my clinical judgment that the patient will require inpatient hospital care spanning beyond 2 midnights from the point of admission due to high intensity of service, high risk for further deterioration and high frequency of surveillance required.*  Author: Frankey Shown, DO 08/22/2023 12:25 AM  For on call review [email or web address].

## 2023-08-21 NOTE — H&P (Incomplete)
History and Physical    Patient: Philip Benjamin:[id number] DOB: 25-Mar-1951 DOA: 08/21/2023 DOS: the patient was seen and examined on 08/21/2023 PCP: Kirstie Peri, MD  Patient coming from: SNF  Chief Complaint:  Chief Complaint  Patient presents with  . Fever   HPI: Philip Benjamin is a 72 y.o. 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  Patient was recently admitted on 10/24 and discharged on 10/30 (yesterday) to a SNF [facility] rehab) due to CKD 5 which advanced to ESRD.  Patient was noted to be febrile today and also complained of " trouble getting his urine out ", but denies chest pain, shortness of breath, nausea, vomiting, abdominal pain.  EMS was activated and patient was taken to the ED for further evaluation and management.  ED Course:  In the emergency department, patient was febrile with a temperature of 100.4 F, tachypneic with respiratory rate of 21/min, tachycardic with HR of 129 bpm, BP was 145/77 and O2 sat was 91% on room air.  Workup in the ED showed leukocytosis with a left shift, normocytic anemia and thrombocytopenia, lactic acid 2.0 > 1.4, urinalysis was positive for large leukocytes and 21-50 WBC.  Blood culture was pending.  Influenza A, B, SARS 2, RSV was negative. CT chest, abdomen and pelvis without contrast showed mild posteromedial left lower lobe atelectasis and/or infiltrate. She was treated with IV vancomycin and cefepime.  Hospitalist was asked to admit patient for further evaluation and management.  Review of Systems: Review of systems as noted in the HPI. All other systems reviewed and are negative.   Past Medical History:  Diagnosis Date  . BPH (benign prostatic hyperplasia)   . Chronic kidney disease    ckd stage 3  . Diabetes mellitus without complication (HCC)   . Hypertension    Past Surgical History:  Procedure Laterality Date   . EYE SURGERY    . INGUINAL HERNIA REPAIR Bilateral 01/23/2018   Procedure: BILATERAL OPEN INGUINAL HERNIA REPAIR WITH MESH;  Surgeon: Jimmye Norman, MD;  Location: [facility] OR;  Service: General;  Laterality: Bilateral;  . INSERTION OF MESH Bilateral 01/23/2018   Procedure: INSERTION OF MESH;  Surgeon: Jimmye Norman, MD;  Location: [facility] OR;  Service: General;  Laterality: Bilateral;  . IR FLUORO GUIDE CV LINE RIGHT  08/15/2023  . IR US GUIDE VASC ACCESS RIGHT  08/15/2023  . VASECTOMY     ~1985    Social History:  reports that he has never smoked. He has never used smokeless tobacco. He reports that he does not drink alcohol and does not use drugs.   No Known Allergies  History reviewed. No pertinent family history.  ***  Prior to Admission medications   Medication Sig Start Date End Date Taking? Authorizing Provider  acetaminophen (TYLENOL) 650 MG CR tablet Take 650 mg by mouth every 6 (six) hours as needed for pain.   Yes [provider]  Cholecalciferol 125 MCG (5000 UT) capsule Take by mouth.   Yes [provider]  cycloSPORINE (RESTASIS) 0.05 % ophthalmic emulsion SMARTSIG:In Eye(s) 08/13/23  Yes [provider]  Dorzolamide HCl-Timolol Mal PF 2-0.5 % SOLN Apply 1 drop to eye 2 (two) times daily. 08/18/23  Yes [provider]  folic acid (FOLVITE) 800 MCG tablet Take 800 mcg by mouth daily.   Yes [provider]  Insulin Glargine (BASAGLAR TEMPO PEN [place]) Inject 30 Units into  the skin at bedtime. Basaglar/Lantus   Yes [provider]  metoprolol tartrate (LOPRESSOR) 25 MG tablet Take 0.5 tablets (12.5 mg total) by mouth 2 (two) times daily. 08/20/23  Yes Tat, Onalee Hua, MD  multivitamin (RENA-VIT) TABS tablet Take 1 tablet by mouth at bedtime. 08/20/23  Yes Tat, Onalee Hua, MD  nystatin cream (MYCOSTATIN) Apply 1 application topically 2 (two) times daily as needed. FOR YEAST OR SKIN RASHES. 12/16/17  Yes [provider]  tamsulosin (FLOMAX)  0.4 MG CAPS capsule Take 0.4 mg by mouth daily with supper. 12/16/17  Yes [provider]    Physical Exam: BP 126/70 (BP Location: Right Arm)   Pulse (!) 111   Temp (!) 100.4 F (38 C) (Oral)   Resp 14   Ht 6\' 1"  (1.854 m)   Wt 95.3 kg   SpO2 97%   BMI 27.72 kg/m   General: 72 y.o. year-old male well developed well nourished in no acute distress.  Alert and oriented x3. HEENT: NCAT, EOMI Neck: Supple, trachea medial Cardiovascular: Tachycardia.  Regular rate and rhythm with no rubs or gallops.  No thyromegaly or JVD noted.  No lower extremity edema. 2/4 pulses in all 4 extremities. Respiratory: Clear to auscultation with no wheezes or rales. Good inspiratory effort. Abdomen: Soft, nontender nondistended with normal bowel sounds x4 quadrants. Muskuloskeletal: No cyanosis, clubbing or edema noted bilaterally Neuro: CN II-XII intact, strength 5/5 x 4, sensation, reflexes intact Skin: No ulcerative lesions noted or rashes Psychiatry: Judgement and insight appear normal. Mood is appropriate for condition and setting          Labs on Admission:  Basic Metabolic Panel: Recent Labs  Lab 08/16/23 0428 08/17/23 0426 08/18/23 0459 08/19/23 0428 08/20/23 0421 08/21/23 1826  NA 135 132* 131* 131* 131* 134*  K 4.0 3.9 3.8 3.7 3.6 3.8  CL 102 97* 95* 95* 94* 95*  CO2 23 26 22 27 26 23   GLUCOSE 129* 116* 119* 103* 93 163*  BUN 51* 39* 55* 39* 51* 38*  CREATININE 4.73* 4.09* 4.96* 3.73* 4.46* 3.71*  CALCIUM 8.9 8.9 8.9 8.7* 9.1 9.2  PHOS 4.4 4.3 4.7* 3.3 3.8  --    Liver Function Tests: Recent Labs  Lab 08/17/23 0426 08/18/23 0459 08/19/23 0428 08/20/23 0421 08/21/23 1826  AST  --   --   --   --  47*  ALT  --   --   --   --  45*  ALKPHOS  --   --   --   --  118  BILITOT  --   --   --   --  0.8  PROT  --   --   --   --  7.2  ALBUMIN 3.4* 3.4* 3.2* 3.4* 3.8   No results for input(s): "LIPASE", "AMYLASE" in the last 168 hours. No results for input(s): "AMMONIA" in  the last 168 hours. CBC: Recent Labs  Lab 08/15/23 0424 08/15/23 1036 08/18/23 1513 08/21/23 2012  WBC 5.5 5.5 7.9 20.0*  NEUTROABS  --   --   --  17.0*  HGB 8.6* 8.6* 8.7* 9.3*  HCT 27.0* 27.1* 29.4* 30.1*  MCV 90.3 91.6 100.7* 92.3  PLT 192 189 150 135*   Cardiac Enzymes: No results for input(s): "CKTOTAL", "CKMB", "CKMBINDEX", "TROPONINI" in the last 168 hours.  BNP (last 3 results) Recent Labs    08/14/23 0515 08/21/23 1826  BNP 45.0 91.0    ProBNP (last 3 results) No results for input(s): "  PROBNP" in the last 8760 hours.  CBG: Recent Labs  Lab 08/19/23 2035 08/20/23 0955 08/20/23 1236 08/20/23 1609 08/20/23 2115  GLUCAP 210* 186* 120* 103* 235*    Radiological Exams on Admission: CT CHEST ABDOMEN PELVIS WO CONTRAST  Result Date: 08/21/2023 CLINICAL DATA:  Sepsis. EXAM: CT CHEST, ABDOMEN AND PELVIS WITHOUT CONTRAST TECHNIQUE: Multidetector CT imaging of the chest, abdomen and pelvis was performed following the standard protocol without IV contrast. RADIATION DOSE REDUCTION: This exam was performed according to the departmental dose-optimization program which includes automated exposure control, adjustment of the mA and/or kV according to patient size and/or use of iterative reconstruction technique. COMPARISON:  None Available. FINDINGS: CT CHEST FINDINGS Cardiovascular: A right-sided venous catheter is in place. There is mild calcification of the aortic arch, without evidence of aortic aneurysm. Normal heart size with moderate severity coronary artery calcification. No pericardial effusion. Mediastinum/Nodes: No enlarged mediastinal, hilar, or axillary lymph nodes. Thyroid gland, trachea, and esophagus demonstrate no significant findings. Lungs/Pleura: Mild lingular and posterior right basilar linear scarring and/or atelectasis is seen. Mild posteromedial left lower lobe atelectasis and/or infiltrate is noted. There is mild elevation of the left hemidiaphragm. No  pleural effusion or pneumothorax is identified. Musculoskeletal: Multilevel degenerative changes are seen throughout the thoracic spine CT ABDOMEN PELVIS FINDINGS Hepatobiliary: No focal liver abnormality is seen. The gallbladder is moderately distended without evidence of gallstones, gallbladder wall thickening, or biliary dilatation. Pancreas: Unremarkable. No pancreatic ductal dilatation or surrounding inflammatory changes. Spleen: Normal in size without focal abnormality. Adrenals/Urinary Tract: Adrenal glands are unremarkable. Kidneys are normal, without obstructing renal calculi, focal lesion, or hydronephrosis. A 1 mm nonobstructing renal calculus is seen within the mid to upper left kidney. Mild, diffuse urinary bladder wall thickening is noted. Stomach/Bowel: Stomach is within normal limits. Appendix appears normal. No evidence of bowel wall thickening, distention, or inflammatory changes. Noninflamed diverticula are seen throughout the large bowel Vascular/Lymphatic: Aortic atherosclerosis. No enlarged abdominal or pelvic lymph nodes. Reproductive: The prostate gland is mildly enlarged. Other: No abdominal wall hernia or abnormality. No abdominopelvic ascites. Musculoskeletal: Multilevel degenerative changes are seen throughout the lumbar spine. IMPRESSION: 1. Mild posteromedial left lower lobe atelectasis and/or infiltrate. 2. Mild lingular and posterior right basilar linear scarring and/or atelectasis. 3. Colonic diverticulosis. 4. 1 mm nonobstructing left renal calculus. 5. Aortic atherosclerosis. Aortic Atherosclerosis (ICD10-I70.0). Electronically Signed   By: Aram Candela M.D.   On: 08/21/2023 22:41    EKG: I independently viewed the EKG done and my findings are as followed: EKG was not done in the ED  Assessment/Plan Present on Admission: . Sepsis due to urinary tract infection (HCC)  Principal Problem:   Sepsis due to urinary tract infection (HCC)   Severe sepsis due to urinary  tract infection Patient was febrile, tachypneic, tachycardic and have leukocytosis (met SIRS criteria) and principal source of infection was UTI due to patient complaining of dysuria and urinalysis suspicious for UTI Patient was started on IV vancomycin and cefepime, we shall continue with cefepime at this time Continue Tylenol as needed Urine and blood culture pending  HCAP POA Patient was started on vancomycin and cefepime, we shall continue same at this time with plan to de-escalate/discontinue based on blood culture, sputum culture, urine Legionella, strep pneumo and procalcitonin Continue Tylenol as needed Continue Mucinex, incentive spirometry, flutter valve   Thrombocytopenia Platelets at 135, no sign of bleeding Continue to monitor platelet levels  Transaminitis This is possible secondary to shock liver Continue to monitor liver  enzymes  ESRD on HD (MWF) Patient was recently started on dialysis.  Nephrology will be consulted  Essential hypertension Type 2 diabetes mellitus with hyperglycemia  Mixed hyperlipidemia Continue statin  BPH Continue Flomax  Glaucoma Continue home meds  DVT prophylaxis: ***   Code Status: ***   Family Communication: ***   Disposition Plan: ***   Consults called: ***   Admission status: ***     Frankey Shown MD [facility] Pager [phone]  If 7PM-7AM, please contact night-coverage [email or web address] Password [facility]  08/21/2023, 11:16 PM        Review of Systems: {ROS_Text:26778} Past Medical History:  Diagnosis Date  . BPH (benign prostatic hyperplasia)   . Chronic kidney disease    ckd stage 3  . Diabetes mellitus without complication (HCC)   . Hypertension    Past Surgical History:  Procedure Laterality Date  . EYE SURGERY    . INGUINAL HERNIA REPAIR Bilateral 01/23/2018   Procedure: BILATERAL OPEN INGUINAL HERNIA REPAIR WITH MESH;  Surgeon: Jimmye Norman, MD;  Location: [facility] OR;  Service: General;   Laterality: Bilateral;  . INSERTION OF MESH Bilateral 01/23/2018   Procedure: INSERTION OF MESH;  Surgeon: Jimmye Norman, MD;  Location: [facility] OR;  Service: General;  Laterality: Bilateral;  . IR FLUORO GUIDE CV LINE RIGHT  08/15/2023  . IR US GUIDE VASC ACCESS RIGHT  08/15/2023  . VASECTOMY     ~1985   Social History:  reports that he has never smoked. He has never used smokeless tobacco. He reports that he does not drink alcohol and does not use drugs.  No Known Allergies  History reviewed. No pertinent family history.  Prior to Admission medications   Medication Sig Start Date End Date Taking? Authorizing Provider  acetaminophen (TYLENOL) 650 MG CR tablet Take 650 mg by mouth every 6 (six) hours as needed for pain.   Yes [provider]  Cholecalciferol 125 MCG (5000 UT) capsule Take by mouth.   Yes [provider]  cycloSPORINE (RESTASIS) 0.05 % ophthalmic emulsion SMARTSIG:In Eye(s) 08/13/23  Yes [provider]  Dorzolamide HCl-Timolol Mal PF 2-0.5 % SOLN Apply 1 drop to eye 2 (two) times daily. 08/18/23  Yes [provider]  folic acid (FOLVITE) 800 MCG tablet Take 800 mcg by mouth daily.   Yes [provider]  Insulin Glargine (BASAGLAR TEMPO PEN [place]) Inject 30 Units into the skin at bedtime. Basaglar/Lantus   Yes [provider]  metoprolol tartrate (LOPRESSOR) 25 MG tablet Take 0.5 tablets (12.5 mg total) by mouth 2 (two) times daily. 08/20/23  Yes Tat, Onalee Hua, MD  multivitamin (RENA-VIT) TABS tablet Take 1 tablet by mouth at bedtime. 08/20/23  Yes Tat, Onalee Hua, MD  nystatin cream (MYCOSTATIN) Apply 1 application topically 2 (two) times daily as needed. FOR YEAST OR SKIN RASHES. 12/16/17  Yes [provider]  tamsulosin (FLOMAX) 0.4 MG CAPS capsule Take 0.4 mg by mouth daily with supper. 12/16/17  Yes [provider]    Physical Exam: Vitals:   08/21/23 2015 08/21/23 2030 08/21/23 2045 08/21/23 2208  BP: 123/77  132/64 121/66 (!) 143/69  Pulse:  (!) 117 (!) 109 (!) 111  Resp: 19 (!) 29 19 19   Temp:   (!) 100.4 F (38 C)   TempSrc:   Oral   SpO2: 96%  96% 95%  Weight:      Height:       *** Data Reviewed: {Tip this will not be part of the note when  signed- Document your independent interpretation of telemetry tracing, EKG, lab, Radiology test or any other diagnostic tests. Add any new diagnostic test ordered today. (Optional):26781} {Results:26384}  Assessment and Plan: No notes have been filed under this hospital service. Service: Hospitalist     Advance Care Planning:   Code Status: Prior ***  Consults: ***  Family Communication: ***  Severity of Illness: {Observation/Inpatient:21159}  Author: Frankey Shown, DO 08/21/2023 11:06 PM  For on call review [email or web address].

## 2023-08-21 NOTE — Consult Note (Signed)
Pharmacy Antibiotic Note  Philip Benjamin is a 72 y.o. male admitted on 08/21/2023 with sepsis.  Pharmacy has been consulted for vancomycin dosing.  Patient has ESRD on HD, outpatient HD schedule MWF  Plan: Give vancomycin 2000 mg IV x 1 loading dose, then start vancomycin 1000 mg after each dialysis session Follow up dialysis schedule and cultures for adjustments  Height: 6\' 1"  (185.4 cm) Weight: 95.3 kg (210 lb 1.6 oz) IBW/kg (Calculated) : 79.9  Temp (24hrs), Avg:99.7 F (37.6 C), Min:99 F (37.2 C), Max:100.4 F (38 C)  Recent Labs  Lab 08/15/23 0424 08/15/23 1036 08/16/23 0428 08/17/23 0426 08/18/23 0459 08/18/23 1513 08/19/23 0428 08/20/23 0421  WBC 5.5 5.5  --   --   --  7.9  --   --   CREATININE 5.75*  --  4.73* 4.09* 4.96*  --  3.73* 4.46*    Estimated Creatinine Clearance: 16.7 mL/min (A) (by C-G formula based on SCr of 4.46 mg/dL (H)).    No Known Allergies  Antimicrobials this admission: vancomycin 10/31 >>  cefepime 10/31 >>   Dose adjustments this admission: N/A  Microbiology results: 10/31 BCx: pending   Thank you for allowing pharmacy to be a part of this patient's care.  Barrie Folk, PharmD 08/21/2023 6:03 PM

## 2023-08-21 NOTE — ED Notes (Signed)
Patient transported to CT 

## 2023-08-21 NOTE — ED Provider Notes (Signed)
[place] EMERGENCY DEPARTMENT AT [facility] Provider Note  CSN: [id number] Arrival date & time: 08/21/23 1713  Chief Complaint(s) Fever  HPI Philip Benjamin is a 72 y.o. 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  Patient discharged yesterday to [facility] rehab and became febrile prompting ER presentation today.  Patient denying any chest pain, shortness of breath, abdominal pain, nausea, vomiting or other systemic symptoms.  States he is having trouble getting his urine out.  Patient arrives febrile and tachycardic.   Past Medical History Past Medical History:  Diagnosis Date   BPH (benign prostatic hyperplasia)    Chronic kidney disease    ckd stage 3   Diabetes mellitus without complication (HCC)    Hypertension    Patient Active Problem List   Diagnosis Date Noted   Malnutrition of moderate degree 08/18/2023   Renal failure 08/14/2023   ESRD (end stage renal disease) (HCC) 08/14/2023   HTN (hypertension) 08/14/2023   T2DM (type 2 diabetes mellitus) (HCC) 08/14/2023   Hyperlipidemia 08/14/2023   BPH (benign prostatic hyperplasia) 08/14/2023   Home Medication(s) Prior to Admission medications   Medication Sig Start Date End Date Taking? Authorizing Provider  acetaminophen (TYLENOL) 650 MG CR tablet Take 650 mg by mouth every 6 (six) hours as needed for pain.   Yes [provider]  Cholecalciferol 125 MCG (5000 UT) capsule Take by mouth.   Yes [provider]  cycloSPORINE (RESTASIS) 0.05 % ophthalmic emulsion SMARTSIG:In Eye(s) 08/13/23  Yes [provider]  Dorzolamide HCl-Timolol Mal PF 2-0.5 % SOLN Apply 1 drop to eye 2 (two) times daily. 08/18/23  Yes [provider]  folic acid (FOLVITE) 800 MCG tablet Take 800 mcg by mouth daily.   Yes [provider]  Insulin Glargine (BASAGLAR TEMPO PEN Bryant) Inject 30  Units into the skin at bedtime. Basaglar/Lantus   Yes [provider]  metoprolol tartrate (LOPRESSOR) 25 MG tablet Take 0.5 tablets (12.5 mg total) by mouth 2 (two) times daily. 08/20/23  Yes Tat, Onalee Hua, MD  multivitamin (RENA-VIT) TABS tablet Take 1 tablet by mouth at bedtime. 08/20/23  Yes Tat, Onalee Hua, MD  nystatin cream (MYCOSTATIN) Apply 1 application topically 2 (two) times daily as needed. FOR YEAST OR SKIN RASHES. 12/16/17  Yes [provider]  tamsulosin (FLOMAX) 0.4 MG CAPS capsule Take 0.4 mg by mouth daily with supper. 12/16/17  Yes [provider]                                                                                                                                    Past Surgical History Past Surgical History:  Procedure Laterality Date   EYE SURGERY     INGUINAL HERNIA REPAIR Bilateral 01/23/2018   Procedure: BILATERAL OPEN INGUINAL HERNIA REPAIR WITH MESH;  Surgeon: Jimmye Norman,  MD;  Location: [facility] OR;  Service: General;  Laterality: Bilateral;   INSERTION OF MESH Bilateral 01/23/2018   Procedure: INSERTION OF MESH;  Surgeon: Jimmye Norman, MD;  Location: [facility] OR;  Service: General;  Laterality: Bilateral;   IR FLUORO GUIDE CV LINE RIGHT  08/15/2023   IR US GUIDE VASC ACCESS RIGHT  08/15/2023   VASECTOMY     ~1610   Family History History reviewed. No pertinent family history.  Social History Social History   Tobacco Use   Smoking status: Never   Smokeless tobacco: Never  Substance Use Topics   Alcohol use: Never   Drug use: Never   Allergies Patient has no known allergies.  Review of Systems Review of Systems  Constitutional:  Positive for fever.  Genitourinary:  Positive for difficulty urinating.    Physical Exam Vital Signs  I have reviewed the triage vital signs BP 121/66 (BP Location: Right Arm)   Pulse (!) 109   Temp (!) 100.4 F (38 C) (Oral)   Resp 19   Ht 6\' 1"  (1.854 m)   Wt 95.3 kg   SpO2 96%   BMI 27.72 kg/m    Physical Exam Constitutional:      General: He is not in acute distress.    Appearance: Normal appearance.  HENT:     Head: Normocephalic and atraumatic.     Nose: No congestion or rhinorrhea.  Eyes:     General:        Right eye: No discharge.        Left eye: No discharge.     Extraocular Movements: Extraocular movements intact.     Pupils: Pupils are equal, round, and reactive to light.  Cardiovascular:     Rate and Rhythm: Regular rhythm. Tachycardia present.     Heart sounds: No murmur heard. Pulmonary:     Effort: No respiratory distress.     Breath sounds: No wheezing or rales.  Abdominal:     General: There is no distension.     Tenderness: There is no abdominal tenderness.  Musculoskeletal:        General: Normal range of motion.     Cervical back: Normal range of motion.  Skin:    General: Skin is warm and dry.  Neurological:     General: No focal deficit present.     Mental Status: He is alert.     ED Results and Treatments Labs (all labs ordered are listed, but only abnormal results are displayed) Labs Reviewed  COMPREHENSIVE METABOLIC PANEL - Abnormal; Notable for the following components:      Result Value   Sodium 134 (*)    Chloride 95 (*)    Glucose, Bld 163 (*)    BUN 38 (*)    Creatinine, Ser 3.71 (*)    AST 47 (*)    ALT 45 (*)    GFR, Estimated 16 (*)    Anion gap 16 (*)    All other components within normal limits  LACTIC ACID, PLASMA - Abnormal; Notable for the following components:   Lactic Acid, Venous 2.0 (*)    All other components within normal limits  CBC WITH DIFFERENTIAL/PLATELET - Abnormal; Notable for the following components:   WBC 20.0 (*)    RBC 3.26 (*)    Hemoglobin 9.3 (*)    HCT 30.1 (*)    Platelets 135 (*)    Neutro Abs 17.0 (*)    Lymphs Abs 0.4 (*)    Monocytes  Absolute 2.3 (*)    Abs Immature Granulocytes 0.19 (*)    All other components within normal limits  URINALYSIS, ROUTINE W REFLEX MICROSCOPIC -  Abnormal; Notable for the following components:   Hgb urine dipstick SMALL (*)    Protein, ur 100 (*)    Leukocytes,Ua LARGE (*)    Bacteria, UA RARE (*)    All other components within normal limits  CULTURE, BLOOD (ROUTINE X 2)  CULTURE, BLOOD (ROUTINE X 2)  RESP PANEL BY RT-PCR (RSV, FLU A&B, COVID)  RVPGX2  LACTIC ACID, PLASMA  BRAIN NATRIURETIC PEPTIDE  CBC WITH DIFFERENTIAL/PLATELET                                                                                                                          Radiology No results found.  Pertinent labs & imaging results that were available during my care of the patient were reviewed by me and considered in my medical decision making (see MDM for details).  Medications Ordered in ED Medications  vancomycin (VANCOCIN) IVPB 1000 mg/200 mL premix (0 mg Intravenous Stopped 08/21/23 1951)    Followed by  vancomycin (VANCOCIN) IVPB 1000 mg/200 mL premix (1,000 mg Intravenous New Bag/Given 08/21/23 2039)  vancomycin (VANCOCIN) IVPB 1000 mg/200 mL premix (has no administration in time range)  ceFEPIme (MAXIPIME) 2 g in sodium chloride 0.9 % 100 mL IVPB (0 g Intravenous Stopped 08/21/23 1951)  lactated ringers bolus 1,000 mL (1,000 mLs Intravenous New Bag/Given 08/21/23 2037)                                                                                                                                     Procedures .Critical Care  Performed by: Glendora Score, MD Authorized by: Glendora Score, MD   Critical care provider statement:    Critical care time (minutes):  30   Critical care was necessary to treat or prevent imminent or life-threatening deterioration of the following conditions:  Sepsis   Critical care was time spent personally by me on the following activities:  Development of treatment plan with patient or surrogate, discussions with consultants, evaluation of patient's response to treatment, examination of patient, ordering and  review of laboratory studies, ordering and review of radiographic studies, ordering and performing treatments and interventions, pulse oximetry, re-evaluation of patient's condition and review of old charts   (including critical care time)  Medical  Decision Making / ED Course   This patient presents to the ED for concern of fever, this involves an extensive number of treatment options, and is a complaint that carries with it a high risk of complications and morbidity.  The differential diagnosis includes bacteremia, sepsis, pneumonia, UTI, intra-abdominal infection, intrathoracic infection  MDM: Patient seen emergency room for evaluation of fever.  Physical exam with rapid irregular tachycardia but is otherwise unremarkable.  No appreciable erythema or pus coming from trialysis catheter.  Patient meets sepsis criteria on arrival and broad-spectrum antibiotics initiated immediately.  Blood cultures drawn.  Laboratory evaluation with leukocytosis to 20.0, hemoglobin 9.3, platelet count 135, BUN 38, creatinine 3.71, In-N-Out catheter performed with large leuk esterase, 11-20 red blood cells, 21-50 white blood cells and rare bacteria.  CT chest abdomen pelvis showing mild posteromedial left lower lobe atelectasis/infiltrate but otherwise is reassuring.  Initial lactic acid 2.0 and after 1 L lactated Ringer's lactic acid normalizing.  Will require hospital admission for sepsis with likely urinary plus minus pulmonary source.  Patient admitted.  Of note, one of the blood cultures was drawn off of his trialysis line to ensure that this is not his source of infection.  Additional history obtained:  -External records from outside source obtained and reviewed including: Chart review including previous notes, labs, imaging, consultation notes   Lab Tests: -I ordered, reviewed, and interpreted labs.   The pertinent results include:   Labs Reviewed  COMPREHENSIVE METABOLIC PANEL - Abnormal; Notable for the  following components:      Result Value   Sodium 134 (*)    Chloride 95 (*)    Glucose, Bld 163 (*)    BUN 38 (*)    Creatinine, Ser 3.71 (*)    AST 47 (*)    ALT 45 (*)    GFR, Estimated 16 (*)    Anion gap 16 (*)    All other components within normal limits  LACTIC ACID, PLASMA - Abnormal; Notable for the following components:   Lactic Acid, Venous 2.0 (*)    All other components within normal limits  CBC WITH DIFFERENTIAL/PLATELET - Abnormal; Notable for the following components:   WBC 20.0 (*)    RBC 3.26 (*)    Hemoglobin 9.3 (*)    HCT 30.1 (*)    Platelets 135 (*)    Neutro Abs 17.0 (*)    Lymphs Abs 0.4 (*)    Monocytes Absolute 2.3 (*)    Abs Immature Granulocytes 0.19 (*)    All other components within normal limits  URINALYSIS, ROUTINE W REFLEX MICROSCOPIC - Abnormal; Notable for the following components:   Hgb urine dipstick SMALL (*)    Protein, ur 100 (*)    Leukocytes,Ua LARGE (*)    Bacteria, UA RARE (*)    All other components within normal limits  CULTURE, BLOOD (ROUTINE X 2)  CULTURE, BLOOD (ROUTINE X 2)  RESP PANEL BY RT-PCR (RSV, FLU A&B, COVID)  RVPGX2  LACTIC ACID, PLASMA  BRAIN NATRIURETIC PEPTIDE  CBC WITH DIFFERENTIAL/PLATELET       Imaging Studies ordered: I ordered imaging studies including CT chest abdomen pelvis I independently visualized and interpreted imaging. I agree with the radiologist interpretation   Medicines ordered and prescription drug management: Meds ordered this encounter  Medications   ceFEPIme (MAXIPIME) 2 g in sodium chloride 0.9 % 100 mL IVPB   FOLLOWED BY Linked Order Group    vancomycin (VANCOCIN) IVPB 1000 mg/200 mL premix  Order Specific Question:   Indication:     Answer:   Sepsis    vancomycin (VANCOCIN) IVPB 1000 mg/200 mL premix     Order Specific Question:   Indication:     Answer:   Sepsis   vancomycin (VANCOCIN) IVPB 1000 mg/200 mL premix    Order Specific Question:   Indication:    Answer:    Sepsis   lactated ringers bolus 1,000 mL    -I have reviewed the patients home medicines and have made adjustments as needed  Critical interventions Fluids, antibiotics    Cardiac Monitoring: The patient was maintained on a cardiac monitor.  I personally viewed and interpreted the cardiac monitored which showed an underlying rhythm of: NSR, sinus tachycardia  Social Determinants of Health:  Factors impacting patients care include: Recently discharged yesterday to skilled nursing   Reevaluation: After the interventions noted above, I reevaluated the patient and found that they have :improved  Co morbidities that complicate the patient evaluation  Past Medical History:  Diagnosis Date   BPH (benign prostatic hyperplasia)    Chronic kidney disease    ckd stage 3   Diabetes mellitus without complication (HCC)    Hypertension       Dispostion: I considered admission for this patient, and patient require hospital admission for urosepsis.     Final Clinical Impression(s) / ED Diagnoses Final diagnoses:  None     @PCDICTATION @    Glendora Score, MD 08/21/23 2300

## 2023-08-22 DIAGNOSIS — N390 Urinary tract infection, site not specified: Secondary | ICD-10-CM | POA: Diagnosis not present

## 2023-08-22 DIAGNOSIS — R319 Hematuria, unspecified: Secondary | ICD-10-CM

## 2023-08-22 DIAGNOSIS — E1165 Type 2 diabetes mellitus with hyperglycemia: Secondary | ICD-10-CM | POA: Insufficient documentation

## 2023-08-22 DIAGNOSIS — D696 Thrombocytopenia, unspecified: Secondary | ICD-10-CM | POA: Diagnosis not present

## 2023-08-22 DIAGNOSIS — H409 Unspecified glaucoma: Secondary | ICD-10-CM | POA: Insufficient documentation

## 2023-08-22 DIAGNOSIS — N3091 Cystitis, unspecified with hematuria: Secondary | ICD-10-CM | POA: Insufficient documentation

## 2023-08-22 DIAGNOSIS — R7401 Elevation of levels of liver transaminase levels: Secondary | ICD-10-CM | POA: Insufficient documentation

## 2023-08-22 DIAGNOSIS — E538 Deficiency of other specified B group vitamins: Secondary | ICD-10-CM | POA: Insufficient documentation

## 2023-08-22 DIAGNOSIS — A419 Sepsis, unspecified organism: Secondary | ICD-10-CM | POA: Insufficient documentation

## 2023-08-22 DIAGNOSIS — J189 Pneumonia, unspecified organism: Secondary | ICD-10-CM | POA: Insufficient documentation

## 2023-08-22 LAB — CBG MONITORING, ED
Glucose-Capillary: 187 mg/dL — ABNORMAL HIGH (ref 70–99)
Glucose-Capillary: 225 mg/dL — ABNORMAL HIGH (ref 70–99)

## 2023-08-22 LAB — COMPREHENSIVE METABOLIC PANEL
ALT: 35 U/L (ref 0–44)
AST: 36 U/L (ref 15–41)
Albumin: 3.2 g/dL — ABNORMAL LOW (ref 3.5–5.0)
Alkaline Phosphatase: 102 U/L (ref 38–126)
Anion gap: 12 (ref 5–15)
BUN: 41 mg/dL — ABNORMAL HIGH (ref 8–23)
CO2: 23 mmol/L (ref 22–32)
Calcium: 8.8 mg/dL — ABNORMAL LOW (ref 8.9–10.3)
Chloride: 97 mmol/L — ABNORMAL LOW (ref 98–111)
Creatinine, Ser: 3.84 mg/dL — ABNORMAL HIGH (ref 0.61–1.24)
GFR, Estimated: 16 mL/min — ABNORMAL LOW (ref >60)
Glucose, Bld: 169 mg/dL — ABNORMAL HIGH (ref 70–99)
Potassium: 3.6 mmol/L (ref 3.5–5.1)
Sodium: 132 mmol/L — ABNORMAL LOW (ref 135–145)
Total Bilirubin: 1 mg/dL (ref 0.3–1.2)
Total Protein: 6.3 g/dL — ABNORMAL LOW (ref 6.5–8.1)

## 2023-08-22 LAB — CBC
HCT: 28.5 % — ABNORMAL LOW (ref 39.0–52.0)
Hemoglobin: 9 g/dL — ABNORMAL LOW (ref 13.0–17.0)
MCH: 29 pg (ref 26.0–34.0)
MCHC: 31.6 g/dL (ref 30.0–36.0)
MCV: 91.9 fL (ref 80.0–100.0)
Platelets: 142 10*3/uL — ABNORMAL LOW (ref 150–400)
RBC: 3.1 MIL/uL — ABNORMAL LOW (ref 4.22–5.81)
RDW: 12.9 % (ref 11.5–15.5)
WBC: 28.8 10*3/uL — ABNORMAL HIGH (ref 4.0–10.5)
nRBC: 0 % (ref 0.0–0.2)

## 2023-08-22 LAB — PHOSPHORUS
Phosphorus: 2.7 mg/dL (ref 2.5–4.6)
Phosphorus: 1.8 mg/dL — ABNORMAL LOW (ref 2.5–4.6)

## 2023-08-22 LAB — PROCALCITONIN: Procalcitonin: 14.27 ng/mL

## 2023-08-22 LAB — GLUCOSE, CAPILLARY
Glucose-Capillary: 124 mg/dL — ABNORMAL HIGH (ref 70–99)
Glucose-Capillary: 230 mg/dL — ABNORMAL HIGH (ref 70–99)

## 2023-08-22 LAB — STREP PNEUMONIAE URINARY ANTIGEN: Strep Pneumo Urinary Antigen: NEGATIVE

## 2023-08-22 LAB — MAGNESIUM: Magnesium: 1.6 mg/dL — ABNORMAL LOW (ref 1.7–2.4)

## 2023-08-22 LAB — MRSA NEXT GEN BY PCR, NASAL: MRSA by PCR Next Gen: NOT DETECTED

## 2023-08-22 LAB — HEPATITIS B SURFACE ANTIGEN: Hepatitis B Surface Ag: NONREACTIVE

## 2023-08-22 MED ORDER — AZITHROMYCIN 250 MG PO TABS
500.0000 mg | ORAL_TABLET | Freq: Every day | ORAL | Status: DC
  Administered 2023-08-22 – 2023-08-25 (×4): 500 mg via ORAL
  Filled 2023-08-22 (×4): qty 2

## 2023-08-22 MED ORDER — CYCLOSPORINE 0.05 % OP EMUL
1.0000 [drp] | Freq: Two times a day (BID) | OPHTHALMIC | Status: DC
  Administered 2023-08-22 – 2023-08-25 (×7): 1 [drp] via OPHTHALMIC
  Filled 2023-08-22 (×7): qty 30

## 2023-08-22 MED ORDER — INSULIN ASPART 100 UNIT/ML IJ SOLN
0.0000 [IU] | Freq: Every day | INTRAMUSCULAR | Status: DC
Start: 2023-08-22 — End: 2023-08-25
  Administered 2023-08-22 – 2023-08-24 (×3): 2 [IU] via SUBCUTANEOUS
  Filled 2023-08-22: qty 1

## 2023-08-22 MED ORDER — SODIUM CHLORIDE 0.9 % IV SOLN
1.0000 g | INTRAVENOUS | Status: DC
  Administered 2023-08-22 – 2023-08-24 (×3): 1 g via INTRAVENOUS
  Filled 2023-08-22 (×5): qty 10

## 2023-08-22 MED ORDER — MAGNESIUM OXIDE -MG SUPPLEMENT 400 (240 MG) MG PO TABS
400.0000 mg | ORAL_TABLET | Freq: Once | ORAL | Status: AC
  Administered 2023-08-22: 400 mg via ORAL
  Filled 2023-08-22: qty 1

## 2023-08-22 MED ORDER — FOLIC ACID 1 MG PO TABS
1000.0000 ug | ORAL_TABLET | Freq: Every day | ORAL | Status: DC
  Administered 2023-08-22 – 2023-08-25 (×4): 1 mg via ORAL
  Filled 2023-08-22 (×4): qty 1

## 2023-08-22 MED ORDER — METOPROLOL TARTRATE 25 MG PO TABS
12.5000 mg | ORAL_TABLET | Freq: Two times a day (BID) | ORAL | Status: DC
  Administered 2023-08-22 – 2023-08-24 (×5): 12.5 mg via ORAL
  Filled 2023-08-22 (×6): qty 1

## 2023-08-22 MED ORDER — TAMSULOSIN HCL 0.4 MG PO CAPS
0.4000 mg | ORAL_CAPSULE | Freq: Every day | ORAL | Status: DC
  Administered 2023-08-22 – 2023-08-24 (×3): 0.4 mg via ORAL
  Filled 2023-08-22 (×3): qty 1

## 2023-08-22 MED ORDER — CHLORHEXIDINE GLUCONATE CLOTH 2 % EX PADS
6.0000 | MEDICATED_PAD | Freq: Every day | CUTANEOUS | Status: DC
Start: 2023-08-22 — End: 2023-08-25
  Administered 2023-08-23 – 2023-08-25 (×3): 6 via TOPICAL

## 2023-08-22 MED ORDER — INSULIN ASPART 100 UNIT/ML IJ SOLN
0.0000 [IU] | Freq: Three times a day (TID) | INTRAMUSCULAR | Status: DC
  Administered 2023-08-22 – 2023-08-23 (×2): 1 [IU] via SUBCUTANEOUS
  Administered 2023-08-23: 2 [IU] via SUBCUTANEOUS
  Administered 2023-08-24 (×2): 1 [IU] via SUBCUTANEOUS
  Filled 2023-08-22: qty 1

## 2023-08-22 MED ORDER — LOPERAMIDE HCL 2 MG PO CAPS
4.0000 mg | ORAL_CAPSULE | Freq: Once | ORAL | Status: AC
  Administered 2023-08-22: 4 mg via ORAL
  Filled 2023-08-22: qty 2

## 2023-08-22 MED ORDER — INSULIN GLARGINE-YFGN 100 UNIT/ML ~~LOC~~ SOLN
10.0000 [IU] | Freq: Every day | SUBCUTANEOUS | Status: DC
Start: 2023-08-22 — End: 2023-08-25
  Administered 2023-08-22 – 2023-08-24 (×3): 10 [IU] via SUBCUTANEOUS
  Filled 2023-08-22 (×4): qty 0.1

## 2023-08-22 NOTE — Progress Notes (Signed)
[facility] NEPHROLOGY PROGRESS NOTE  Assessment/ Plan: Pt is a 72 y.o. yo male    # ESRD: with dysgeusia/ anorexia, increased hypervolemia. TDC placed 10/25 and started HD that day. Placed at [facility]. Plan for dialysis today per MWF schedule.  # Sepsis: likely urinary source. IV abx per primary team  #  Anemia:  Hgb 9. Resume ESA. No iron   # CKD-MBD: Phos normal. Ca normal when accounting for albumin. PTH mildly elevated. No therapy needed  #Hyponatremia: secondary to free water retention in setting of ESRD. Dialysis as above   #  HTN/volume: Controlled on metop  # DM2: mgmt per primary  Subjective: Patient was discharged on 08/20/2023 after he been admitted to start dialysis and felt to be ESRD.  Presented to the hospital last night after being noted to be febrile at his skilled nursing facility and felt like he could not empty his bladder.  He also endorsed frequent urination.  Denied shortness of breath chest pain nausea vomiting and diarrhea.  In the emergency department he was noted to be febrile with leukocytosis.  Based on urinalysis there was concern that this is urinary source.  Objective Vital signs in last 24 hours: Vitals:   08/22/23 0830 08/22/23 0844 08/22/23 0900 08/22/23 1007  BP: 125/62  122/64   Pulse: 97  95   Resp: 12  18   Temp:  97.8 F (36.6 C)  98.7 F (37.1 C)  TempSrc:  Oral  Oral  SpO2: 100%  100%   Weight:   95.4 kg   Height:   6\' 1"  (1.854 m)    Weight change:  No intake or output data in the 24 hours ending 08/22/23 1206      Labs: RENAL PANEL Recent Labs  Lab 08/17/23 0426 08/18/23 0459 08/19/23 0428 08/20/23 0421 08/21/23 1826 08/22/23 0422  NA 132* 131* 131* 131* 134* 132*  K 3.9 3.8 3.7 3.6 3.8 3.6  CL 97* 95* 95* 94* 95* 97*  CO2 26 22 27 26 23 23   GLUCOSE 116* 119* 103* 93 163* 169*  BUN 39* 55* 39* 51* 38* 41*  CREATININE 4.09* 4.96* 3.73* 4.46* 3.71* 3.84*  CALCIUM 8.9 8.9 8.7* 9.1 9.2 8.8*  MG   --   --   --   --   --  1.6*  PHOS 4.3 4.7* 3.3 3.8  --  2.7  ALBUMIN 3.4* 3.4* 3.2* 3.4* 3.8 3.2*    Liver Function Tests: Recent Labs  Lab 08/20/23 0421 08/21/23 1826 08/22/23 0422  AST  --  47* 36  ALT  --  45* 35  ALKPHOS  --  118 102  BILITOT  --  0.8 1.0  PROT  --  7.2 6.3*  ALBUMIN 3.4* 3.8 3.2*   No results for input(s): "LIPASE", "AMYLASE" in the last 168 hours. No results for input(s): "AMMONIA" in the last 168 hours. CBC: Recent Labs    08/15/23 0424 08/15/23 1036 08/18/23 1513 08/21/23 2012 08/22/23 0422  HGB 8.6* 8.6* 8.7* 9.3* 9.0*  MCV 90.3 91.6 100.7* 92.3 91.9  VITAMINB12 275  --   --   --   --   FOLATE 3.8*  --   --   --   --   FERRITIN 188  --   --   --   --   TIBC 289  --   --   --   --   IRON 86  --   --   --   --  RETICCTPCT 0.8  --   --   --   --     Cardiac Enzymes: No results for input(s): "CKTOTAL", "CKMB", "CKMBINDEX", "TROPONINI" in the last 168 hours. CBG: Recent Labs  Lab 08/20/23 1236 08/20/23 1609 08/20/23 2115 08/22/23 0026 08/22/23 0845  GLUCAP 120* 103* 235* 225* 187*    Iron Studies: No results for input(s): "IRON", "TIBC", "TRANSFERRIN", "FERRITIN" in the last 72 hours. Studies/Results: CT CHEST ABDOMEN PELVIS WO CONTRAST  Result Date: 08/21/2023 CLINICAL DATA:  Sepsis. EXAM: CT CHEST, ABDOMEN AND PELVIS WITHOUT CONTRAST TECHNIQUE: Multidetector CT imaging of the chest, abdomen and pelvis was performed following the standard protocol without IV contrast. RADIATION DOSE REDUCTION: This exam was performed according to the departmental dose-optimization program which includes automated exposure control, adjustment of the mA and/or kV according to patient size and/or use of iterative reconstruction technique. COMPARISON:  None Available. FINDINGS: CT CHEST FINDINGS Cardiovascular: A right-sided venous catheter is in place. There is mild calcification of the aortic arch, without evidence of aortic aneurysm. Normal heart size with  moderate severity coronary artery calcification. No pericardial effusion. Mediastinum/Nodes: No enlarged mediastinal, hilar, or axillary lymph nodes. Thyroid gland, trachea, and esophagus demonstrate no significant findings. Lungs/Pleura: Mild lingular and posterior right basilar linear scarring and/or atelectasis is seen. Mild posteromedial left lower lobe atelectasis and/or infiltrate is noted. There is mild elevation of the left hemidiaphragm. No pleural effusion or pneumothorax is identified. Musculoskeletal: Multilevel degenerative changes are seen throughout the thoracic spine CT ABDOMEN PELVIS FINDINGS Hepatobiliary: No focal liver abnormality is seen. The gallbladder is moderately distended without evidence of gallstones, gallbladder wall thickening, or biliary dilatation. Pancreas: Unremarkable. No pancreatic ductal dilatation or surrounding inflammatory changes. Spleen: Normal in size without focal abnormality. Adrenals/Urinary Tract: Adrenal glands are unremarkable. Kidneys are normal, without obstructing renal calculi, focal lesion, or hydronephrosis. A 1 mm nonobstructing renal calculus is seen within the mid to upper left kidney. Mild, diffuse urinary bladder wall thickening is noted. Stomach/Bowel: Stomach is within normal limits. Appendix appears normal. No evidence of bowel wall thickening, distention, or inflammatory changes. Noninflamed diverticula are seen throughout the large bowel Vascular/Lymphatic: Aortic atherosclerosis. No enlarged abdominal or pelvic lymph nodes. Reproductive: The prostate gland is mildly enlarged. Other: No abdominal wall hernia or abnormality. No abdominopelvic ascites. Musculoskeletal: Multilevel degenerative changes are seen throughout the lumbar spine. IMPRESSION: 1. Mild posteromedial left lower lobe atelectasis and/or infiltrate. 2. Mild lingular and posterior right basilar linear scarring and/or atelectasis. 3. Colonic diverticulosis. 4. 1 mm nonobstructing left  renal calculus. 5. Aortic atherosclerosis. Aortic Atherosclerosis (ICD10-I70.0). Electronically Signed   By: Aram Candela M.D.   On: 08/21/2023 22:41    Medications: Infusions:  ceFEPime (MAXIPIME) IV     vancomycin      Scheduled Medications:  azithromycin  500 mg Oral Daily   Chlorhexidine Gluconate Cloth  6 each Topical Q0600   cycloSPORINE  1 drop Both Eyes BID   dextromethorphan-guaiFENesin  1 tablet Oral BID   folic acid  1,000 mcg Oral Daily   heparin  5,000 Units Subcutaneous Q8H   insulin aspart  0-5 Units Subcutaneous QHS   insulin aspart  0-6 Units Subcutaneous TID WC   insulin glargine-yfgn  10 Units Subcutaneous QHS   metoprolol tartrate  12.5 mg Oral BID   tamsulosin  0.4 mg Oral Q supper    have reviewed scheduled and prn medications.  Physical Exam: General:NAD, comfortable Heart:normal rate, no rub Lungs:clear b/l, no crackle Abdomen:soft, Non-tender, non-distended Extremities:No edema,  warm and well perfused Dialysis Access: Right IJ TDC in place.  Darnell Level 08/22/2023,12:06 PM  LOS: 1 day

## 2023-08-22 NOTE — Progress Notes (Addendum)
Returned from dialysis. Vitals stable .  Eating TV dinner and watching televsion.  Several loose stools.  Received order for cdiff.  No bms since order. Bottle in room  MRSA sent to lab

## 2023-08-22 NOTE — Consult Note (Signed)
Pharmacy Antibiotic Note  ASSESSMENT: 72 y.o. male with PMH ESRD-HD (MWF), T2DM is presenting with sepsis. He was recently discharged on 10/30 to SNF but is back today with fever and difficulty urinating. CTAP notable for LLL atelectasis vs. Infiltrate, colonic diverticulosis, and nonobstructing L renal calculus. Pharmacy has been consulted to manage cefepime dosing.   Patient measurements: Height: 6\' 1"  (185.4 cm) Weight: 95.3 kg (210 lb 1.6 oz) IBW/kg (Calculated) : 79.9  Vital signs: Temp: 99.6 F (37.6 C) (11/01 0630) Temp Source: Oral (11/01 0630) BP: 118/63 (11/01 0800) Pulse Rate: 96 (11/01 0800) Recent Labs  Lab 08/18/23 1513 08/19/23 0428 08/20/23 0421 08/21/23 1826 08/21/23 2012 08/22/23 0422  WBC 7.9  --   --   --  20.0* 28.8*  CREATININE  --    < > 4.46* 3.71*  --  3.84*   < > = values in this interval not displayed.   Estimated Creatinine Clearance: 19.4 mL/min (A) (by C-G formula based on SCr of 3.84 mg/dL (H)).  Allergies: No Known Allergies  Antimicrobials this admission: Cefepime 10/31 >>  Vancomycin 10/31 >>  Dose adjustments this admission: N/A  Microbiology results: 10/31 BCx: NGTD 10/31 UCx: sent 10/31 RSV/Flu/COVID: negative  11/01 MRSA PCR: ordered  PLAN: Initiate cefepime 1 g IV q24H Continue vancomycin 1000 mg IV with dialysis Follow up culture results to assess for antibiotic optimization. Monitor renal function to assess for any necessary antibiotic dosing changes.   Thank you for allowing pharmacy to be a part of this patient's care.  Will M. Dareen Piano, PharmD Clinical Pharmacist 08/22/2023 8:34 AM

## 2023-08-22 NOTE — Procedures (Signed)
I was present at this dialysis session. I have reviewed the session itself and made appropriate changes.   Filed Weights   08/21/23 1733 08/22/23 0900  Weight: 95.3 kg 95.4 kg    Recent Labs  Lab 08/22/23 0422  NA 132*  K 3.6  CL 97*  CO2 23  GLUCOSE 169*  BUN 41*  CREATININE 3.84*  CALCIUM 8.8*  PHOS 2.7    Recent Labs  Lab 08/18/23 1513 08/21/23 2012 08/22/23 0422  WBC 7.9 20.0* 28.8*  NEUTROABS  --  17.0*  --   HGB 8.7* 9.3* 9.0*  HCT 29.4* 30.1* 28.5*  MCV 100.7* 92.3 91.9  PLT 150 135* 142*    Scheduled Meds:  azithromycin  500 mg Oral Daily   Chlorhexidine Gluconate Cloth  6 each Topical Q0600   cycloSPORINE  1 drop Both Eyes BID   dextromethorphan-guaiFENesin  1 tablet Oral BID   folic acid  1,000 mcg Oral Daily   heparin  5,000 Units Subcutaneous Q8H   insulin aspart  0-5 Units Subcutaneous QHS   insulin aspart  0-6 Units Subcutaneous TID WC   insulin glargine-yfgn  10 Units Subcutaneous QHS   metoprolol tartrate  12.5 mg Oral BID   tamsulosin  0.4 mg Oral Q supper   Continuous Infusions:  ceFEPime (MAXIPIME) IV     vancomycin     PRN Meds:.acetaminophen **OR** acetaminophen, ondansetron **OR** ondansetron (ZOFRAN) IV, vancomycin   Louie Bun,  MD 08/22/2023, 12:15 PM

## 2023-08-22 NOTE — Progress Notes (Addendum)
PROGRESS NOTE  Philip Benjamin YQM:[id number] DOB: [birth year] DOA: 08/21/2023 PCP: Kirstie Peri, MD  Brief History:  72 year old male with a history of hypertension, diabetes mellitus, hyperlipidemia newly diagnosed ESRD from his last hospitalization presenting from [facility] rehab center secondary to fever with nausea and dry heaving.  The patient was recently hospitalized from 08/14/2023-08/20/2023 where he was declared new ESRD and initiated on dialysis.  TDC was placed on 08/15/2023, and the patient was initiated on HD.  On 08/21/2023, the patient began having symptoms of urinary frequency and urgency.  He has subjective fevers and chills.  He had some nausea and began subsequently having some dry heaving.  As result, he was sent to the emergency department for further evaluation and treatment.  The patient denied any headache, chest pain, shortness breath, cough, hemoptysis.  He denied any abdominal pain, frank dysuria, hematochezia, melena. In the ED, the patient had a fever of 101.4 F.  He was tachycardic into the 120s.  He was hemodynamically stable with oxygen saturation 96-98% on room air.  WBC 20.0, hemoglobin 9.3, platelets 135.  Sodium 132, potassium 3.6, bicarbonate 23, serum creatinine 3.4.  CT of the chest, abdomen, pelvis showed mild lingular/posterior right basilar atelectasis, mild posterior medial LLL infiltrate/atelectasis.  There was mild diffuse urinary bladder wall thickening.  There is a nonobstructive left renal calculus.  There is no bowel wall thickening or obstruction.  Blood cultures were obtained.  UA showed 21-50 WBC.  The patient was started on vancomycin and cefepime.   Assessment/Plan: Severe sepsis -Presented with fever, tachycardia, leukocytosis -Secondary to urinary source and possible pneumonia -PCT 14.27 -UA 21-50 WBC -Continue vancomycin and cefepime pending culture data -Follow blood and urine cultures -08/21/2023 CT CAP--as discussed above in the  history -Lactic acid 2.0>> 1.4  Pyuria -Concerning for UTI  Lobar pneumonia -08/21/2023 CT chest as discussed above -Add azithromycin -MRSA screen -COVID-19 PCR negative -Continue empiric vancomycin and cefepime pending culture data  ESRD -Nephrology consulted for maintenance dialysis  Thrombocytopenia -Secondary to sepsis -Anticipate improvement with treatment of sepsis  Transaminasemia -Secondary to sepsis and hemodynamic changes -No abdominal pain presently -08/21/2023 CT abdomen negative for gallbladder wall thickening or liver abnormalities -Trend LFTs  BPH -Continue tamsulosin  Diabetes mellitus type 2 -08/14/2023 hemoglobin A1c 5.6 -Continue NovoLog sliding scale -Continue reduced dose Semglee  Essential hypertension -Restart metoprolol -His losartan and diltiazem were discontinued during his last hospital admission secondary to soft BPs  Hyperlipidemia -Continue statin  Anemia of CKD:  - Hgb stable in 8 range, no bleeding. On ESA per nephrology.  Hypomagnesemia -replete     Family Communication: no  Family at bedside  Consultants:  renal  Code Status:  FULL   DVT Prophylaxis:  [place] Heparin    Procedures: As Listed in Progress Note Above  Antibiotics: Vanc 10/31>> Cefepime 10/31>>      Subjective:  Patient complains of urinary frequency and urgency.  He denies any headache, chest pain, shortness breath, coughing, hemoptysis.  He has some nausea without emesis.  He denies any abdominal pain.  There is no hematochezia or melena. Objective: Vitals:   08/22/23 0530 08/22/23 0630 08/22/23 0700 08/22/23 0800  BP: 125/70 121/61 121/67 118/63  Pulse: (!) 108 (!) 101 (!) 102 96  Resp: 10 18 17 18   Temp:  99.6 F (37.6 C)    TempSrc:  Oral    SpO2: 95% 94% 96% 94%  Weight:  Height:       No intake or output data in the 24 hours ending 08/22/23 0813 Weight change:  Exam:  General:  Pt is alert, follows commands appropriately, not  in acute distress HEENT: No icterus, No thrush, No neck mass, [place]/AT Cardiovascular: RRR, S1/S2, no rubs, no gallops Respiratory: Bibasilar crackles.  No wheezing.  Good air movement Abdomen: Soft/+BS, mild suprapubic tender, non distended, no guarding Extremities: No edema, No lymphangitis, No petechiae, No rashes, no synovitis   Data Reviewed: I have personally reviewed following labs and imaging studies Basic Metabolic Panel: Recent Labs  Lab 08/17/23 0426 08/18/23 0459 08/19/23 0428 08/20/23 0421 08/21/23 1826 08/22/23 0422  NA 132* 131* 131* 131* 134* 132*  K 3.9 3.8 3.7 3.6 3.8 3.6  CL 97* 95* 95* 94* 95* 97*  CO2 26 22 27 26 23 23   GLUCOSE 116* 119* 103* 93 163* 169*  BUN 39* 55* 39* 51* 38* 41*  CREATININE 4.09* 4.96* 3.73* 4.46* 3.71* 3.84*  CALCIUM 8.9 8.9 8.7* 9.1 9.2 8.8*  MG  --   --   --   --   --  1.6*  PHOS 4.3 4.7* 3.3 3.8  --  2.7   Liver Function Tests: Recent Labs  Lab 08/18/23 0459 08/19/23 0428 08/20/23 0421 08/21/23 1826 08/22/23 0422  AST  --   --   --  47* 36  ALT  --   --   --  45* 35  ALKPHOS  --   --   --  118 102  BILITOT  --   --   --  0.8 1.0  PROT  --   --   --  7.2 6.3*  ALBUMIN 3.4* 3.2* 3.4* 3.8 3.2*   No results for input(s): "LIPASE", "AMYLASE" in the last 168 hours. No results for input(s): "AMMONIA" in the last 168 hours. Coagulation Profile: No results for input(s): "INR", "PROTIME" in the last 168 hours. CBC: Recent Labs  Lab 08/15/23 1036 08/18/23 1513 08/21/23 2012 08/22/23 0422  WBC 5.5 7.9 20.0* 28.8*  NEUTROABS  --   --  17.0*  --   HGB 8.6* 8.7* 9.3* 9.0*  HCT 27.1* 29.4* 30.1* 28.5*  MCV 91.6 100.7* 92.3 91.9  PLT 189 150 135* 142*   Cardiac Enzymes: No results for input(s): "CKTOTAL", "CKMB", "CKMBINDEX", "TROPONINI" in the last 168 hours. BNP: Invalid input(s): "POCBNP" CBG: Recent Labs  Lab 08/20/23 0955 08/20/23 1236 08/20/23 1609 08/20/23 2115 08/22/23 0026  GLUCAP 186* 120* 103* 235* 225*    HbA1C: No results for input(s): "HGBA1C" in the last 72 hours. Urine analysis:    Component Value Date/Time   COLORURINE YELLOW 08/21/2023 2033   APPEARANCEUR CLEAR 08/21/2023 2033   LABSPEC 1.010 08/21/2023 2033   PHURINE 7.0 08/21/2023 2033   GLUCOSEU NEGATIVE 08/21/2023 2033   HGBUR SMALL (A) 08/21/2023 2033   BILIRUBINUR NEGATIVE 08/21/2023 2033   KETONESUR NEGATIVE 08/21/2023 2033   PROTEINUR 100 (A) 08/21/2023 2033   NITRITE NEGATIVE 08/21/2023 2033   LEUKOCYTESUR LARGE (A) 08/21/2023 2033   Sepsis Labs: @LABRCNTIP (procalcitonin:4,lacticidven:4) ) Recent Results (from the past 240 hour(s))  Resp panel by RT-PCR (RSV, Flu A&B, Covid) Anterior Nasal Swab     Status: None   Collection Time: 08/21/23  4:18 PM   Specimen: Anterior Nasal Swab  Result Value Ref Range Status   SARS Coronavirus 2 by RT PCR NEGATIVE NEGATIVE Final    Comment: (NOTE) SARS-CoV-2 target nucleic acids are NOT DETECTED.  The SARS-CoV-2  RNA is generally detectable in upper respiratory specimens during the acute phase of infection. The lowest concentration of SARS-CoV-2 viral copies this assay can detect is 138 copies/mL. A negative result does not preclude SARS-Cov-2 infection and should not be used as the sole basis for treatment or other patient management decisions. A negative result may occur with  improper specimen collection/handling, submission of specimen other than nasopharyngeal swab, presence of viral mutation(s) within the areas targeted by this assay, and inadequate number of viral copies(<138 copies/mL). A negative result must be combined with clinical observations, patient history, and epidemiological information. The expected result is Negative.  Fact Sheet for Patients:  BloggerCourse.com  Fact Sheet for Healthcare Providers:  SeriousBroker.it  This test is no t yet approved or cleared by the [place] FDA and  has been  authorized for detection and/or diagnosis of SARS-CoV-2 by FDA under an Emergency Use Authorization (EUA). This EUA will remain  in effect (meaning this test can be used) for the duration of the COVID-19 declaration under Section 564(b)(1) of the Act, 21 U.S.C.section 360bbb-3(b)(1), unless the authorization is terminated  or revoked sooner.       Influenza A by PCR NEGATIVE NEGATIVE Final   Influenza B by PCR NEGATIVE NEGATIVE Final    Comment: (NOTE) The Xpert Xpress SARS-CoV-2/FLU/RSV plus assay is intended as an aid in the diagnosis of influenza from Nasopharyngeal swab specimens and should not be used as a sole basis for treatment. Nasal washings and aspirates are unacceptable for Xpert Xpress SARS-CoV-2/FLU/RSV testing.  Fact Sheet for Patients: BloggerCourse.com  Fact Sheet for Healthcare Providers: SeriousBroker.it  This test is not yet approved or cleared by the [place] FDA and has been authorized for detection and/or diagnosis of SARS-CoV-2 by FDA under an Emergency Use Authorization (EUA). This EUA will remain in effect (meaning this test can be used) for the duration of the COVID-19 declaration under Section 564(b)(1) of the Act, 21 U.S.C. section 360bbb-3(b)(1), unless the authorization is terminated or revoked.     Resp Syncytial Virus by PCR NEGATIVE NEGATIVE Final    Comment: (NOTE) Fact Sheet for Patients: BloggerCourse.com  Fact Sheet for Healthcare Providers: SeriousBroker.it  This test is not yet approved or cleared by the [place] FDA and has been authorized for detection and/or diagnosis of SARS-CoV-2 by FDA under an Emergency Use Authorization (EUA). This EUA will remain in effect (meaning this test can be used) for the duration of the COVID-19 declaration under Section 564(b)(1) of the Act, 21 U.S.C. section 360bbb-3(b)(1), unless the  authorization is terminated or revoked.  Performed at [facility], [address], [place], [place] [address]   Blood culture (routine x 2)     Status: None (Preliminary result)   Collection Time: 08/21/23  6:26 PM   Specimen: BLOOD RIGHT FOREARM  Result Value Ref Range Status   Specimen Description BLOOD RIGHT FOREARM  Final   Special Requests   Final    BOTTLES DRAWN AEROBIC ONLY Blood Culture adequate volume   Culture   Final    NO GROWTH < 12 HOURS Performed at [facility], [address], [place], [place] [address]    Report Status PENDING  Incomplete  Blood culture (routine x 2)     Status: None (Preliminary result)   Collection Time: 08/21/23  6:35 PM   Specimen: BLOOD  Result Value Ref Range Status   Specimen Description BLOOD DIAL CHEST PORT  Final   Special Requests   Final  HEMODIALYSIS LINE BOTTLES DRAWN AEROBIC AND ANAEROBIC Blood Culture adequate volume   Culture   Final    NO GROWTH < 12 HOURS Performed at [facility], [address], [place], [place] [address]    Report Status PENDING  Incomplete     Scheduled Meds:  Chlorhexidine Gluconate Cloth  6 each Topical Q0600   cycloSPORINE  1 drop Both Eyes BID   dextromethorphan-guaiFENesin  1 tablet Oral BID   folic acid  1,000 mcg Oral Daily   heparin  5,000 Units Subcutaneous Q8H   insulin aspart  0-5 Units Subcutaneous QHS   insulin aspart  0-6 Units Subcutaneous TID WC   insulin glargine-yfgn  10 Units Subcutaneous QHS   metoprolol tartrate  12.5 mg Oral BID   tamsulosin  0.4 mg Oral Q supper   Continuous Infusions:  vancomycin      Procedures/Studies: CT CHEST ABDOMEN PELVIS WO CONTRAST  Result Date: 08/21/2023 CLINICAL DATA:  Sepsis. EXAM: CT CHEST, ABDOMEN AND PELVIS WITHOUT CONTRAST TECHNIQUE: Multidetector CT imaging of the chest, abdomen and pelvis was performed following the standard protocol without IV contrast. RADIATION DOSE REDUCTION: This exam was performed according to the departmental  dose-optimization program which includes automated exposure control, adjustment of the mA and/or kV according to patient size and/or use of iterative reconstruction technique. COMPARISON:  None Available. FINDINGS: CT CHEST FINDINGS Cardiovascular: A right-sided venous catheter is in place. There is mild calcification of the aortic arch, without evidence of aortic aneurysm. Normal heart size with moderate severity coronary artery calcification. No pericardial effusion. Mediastinum/Nodes: No enlarged mediastinal, hilar, or axillary lymph nodes. Thyroid gland, trachea, and esophagus demonstrate no significant findings. Lungs/Pleura: Mild lingular and posterior right basilar linear scarring and/or atelectasis is seen. Mild posteromedial left lower lobe atelectasis and/or infiltrate is noted. There is mild elevation of the left hemidiaphragm. No pleural effusion or pneumothorax is identified. Musculoskeletal: Multilevel degenerative changes are seen throughout the thoracic spine CT ABDOMEN PELVIS FINDINGS Hepatobiliary: No focal liver abnormality is seen. The gallbladder is moderately distended without evidence of gallstones, gallbladder wall thickening, or biliary dilatation. Pancreas: Unremarkable. No pancreatic ductal dilatation or surrounding inflammatory changes. Spleen: Normal in size without focal abnormality. Adrenals/Urinary Tract: Adrenal glands are unremarkable. Kidneys are normal, without obstructing renal calculi, focal lesion, or hydronephrosis. A 1 mm nonobstructing renal calculus is seen within the mid to upper left kidney. Mild, diffuse urinary bladder wall thickening is noted. Stomach/Bowel: Stomach is within normal limits. Appendix appears normal. No evidence of bowel wall thickening, distention, or inflammatory changes. Noninflamed diverticula are seen throughout the large bowel Vascular/Lymphatic: Aortic atherosclerosis. No enlarged abdominal or pelvic lymph nodes. Reproductive: The prostate gland  is mildly enlarged. Other: No abdominal wall hernia or abnormality. No abdominopelvic ascites. Musculoskeletal: Multilevel degenerative changes are seen throughout the lumbar spine. IMPRESSION: 1. Mild posteromedial left lower lobe atelectasis and/or infiltrate. 2. Mild lingular and posterior right basilar linear scarring and/or atelectasis. 3. Colonic diverticulosis. 4. 1 mm nonobstructing left renal calculus. 5. Aortic atherosclerosis. Aortic Atherosclerosis (ICD10-I70.0). Electronically Signed   By: Aram Candela M.D.   On: 08/21/2023 22:41   IR Fluoro Guide CV Line Right  Result Date: 08/15/2023 INDICATION: Renal failure EXAM: Ultrasound and fluoroscopy guided tunneled hemodialysis catheter placement MEDICATIONS: Ancef 2 g IV; The antibiotic was administered within an appropriate time interval prior to skin puncture. ANESTHESIA/SEDATION: Moderate (conscious) sedation was employed during this procedure. A total of Versed 0.5 mg and Fentanyl 25 mcg was administered intravenously by the radiology  nurse. Total intra-service moderate Sedation Time: 10 minutes. The patient's level of consciousness and vital signs were monitored continuously by radiology nursing throughout the procedure under my direct supervision. FLUOROSCOPY: Radiation Exposure Index (as provided by the fluoroscopic device): 3 mGy Kerma COMPLICATIONS: None immediate. PROCEDURE: Informed written consent was obtained from the patient after a thorough discussion of the procedural risks, benefits and alternatives. All questions were addressed. Maximal Sterile Barrier Technique was utilized including caps, mask, sterile gowns, sterile gloves, sterile drape, hand hygiene and skin antiseptic. A timeout was performed prior to the initiation of the procedure. The right internal jugular vein was evaluated with ultrasound and shown to be patent. A permanent ultrasound image was obtained and placed in the patient's medical record. Using sterile gel and  a sterile probe cover, the right internal jugular vein was entered with a 21 ga needle during real time ultrasound guidance. 0.018 inch guidewire placed and 21 ga needle exchanged for transitional dilator set. Utilizing fluoroscopy, 0.035 inch guidewire advanced through the dilator without difficulty. Seriel dilation was performed and peel-away sheath was placed. Attention then turned to the right anterior upper chest. Following local lidocaine administration, the hemodialysis catheter was tunneled from the chest wall to the venotomy site. The catheter was inserted through the peel-away sheath. The tip of the catheter was positioned within the right atrium using fluoroscopic guidance. All lumens of the catheter aspirated and flushed well. The dialysis lumens were locked with Heparin. The catheter was secured to the skin with suture. The insertion site was covered with sterile dressing. IMPRESSION: 23 cm tunneled right IJ hemodialysis catheter is ready for use. Electronically Signed   By: Acquanetta Belling M.D.   On: 08/15/2023 15:59   IR US Guide Vasc Access Right  Result Date: 08/15/2023 INDICATION: Renal failure EXAM: Ultrasound and fluoroscopy guided tunneled hemodialysis catheter placement MEDICATIONS: Ancef 2 g IV; The antibiotic was administered within an appropriate time interval prior to skin puncture. ANESTHESIA/SEDATION: Moderate (conscious) sedation was employed during this procedure. A total of Versed 0.5 mg and Fentanyl 25 mcg was administered intravenously by the radiology nurse. Total intra-service moderate Sedation Time: 10 minutes. The patient's level of consciousness and vital signs were monitored continuously by radiology nursing throughout the procedure under my direct supervision. FLUOROSCOPY: Radiation Exposure Index (as provided by the fluoroscopic device): 3 mGy Kerma COMPLICATIONS: None immediate. PROCEDURE: Informed written consent was obtained from the patient after a thorough discussion  of the procedural risks, benefits and alternatives. All questions were addressed. Maximal Sterile Barrier Technique was utilized including caps, mask, sterile gowns, sterile gloves, sterile drape, hand hygiene and skin antiseptic. A timeout was performed prior to the initiation of the procedure. The right internal jugular vein was evaluated with ultrasound and shown to be patent. A permanent ultrasound image was obtained and placed in the patient's medical record. Using sterile gel and a sterile probe cover, the right internal jugular vein was entered with a 21 ga needle during real time ultrasound guidance. 0.018 inch guidewire placed and 21 ga needle exchanged for transitional dilator set. Utilizing fluoroscopy, 0.035 inch guidewire advanced through the dilator without difficulty. Seriel dilation was performed and peel-away sheath was placed. Attention then turned to the right anterior upper chest. Following local lidocaine administration, the hemodialysis catheter was tunneled from the chest wall to the venotomy site. The catheter was inserted through the peel-away sheath. The tip of the catheter was positioned within the right atrium using fluoroscopic guidance. All lumens of the catheter aspirated  and flushed well. The dialysis lumens were locked with Heparin. The catheter was secured to the skin with suture. The insertion site was covered with sterile dressing. IMPRESSION: 23 cm tunneled right IJ hemodialysis catheter is ready for use. Electronically Signed   By: Acquanetta Belling M.D.   On: 08/15/2023 15:59   DG Chest Port 1 View  Result Date: 08/14/2023 CLINICAL DATA:  Weakness.  Lower extremity edema. EXAM: PORTABLE CHEST 1 VIEW COMPARISON:  05/20/2023 FINDINGS: Stable cardiomediastinal contours. Low lung volumes. Eventration of the hemidiaphragms. Unchanged scarring and volume loss identified within the left base. No airspace consolidation, pleural effusions, or interstitial edema. No pneumothorax.  IMPRESSION: Low lung volumes and left base scarring. No acute findings. Electronically Signed   By: Signa Kell M.D.   On: 08/14/2023 05:29    Catarina Hartshorn, DO  [facility]  If 7PM-7AM, please contact night-coverage [email or web address] Password [email or web address] 08/22/2023, 8:13 AM   LOS: 1 day

## 2023-08-22 NOTE — Progress Notes (Signed)
Pt completed HD tx without issue. Pt goal met. Vancomycin 1 g IV given during tx.  08/22/23 1534  Vitals  Temp 98.5 F (36.9 C)  Temp Source Oral  BP 119/71  BP Location Right Arm  BP Method Automatic  Patient Position (if appropriate) Lying  Pulse Rate 98  Resp 15  Oxygen Therapy  SpO2 98 %  O2 Device Room Air  During Treatment Monitoring  Intra-Hemodialysis Comments Tx completed  Post Treatment  Dialyzer Clearance Lightly streaked  Hemodialysis Intake (mL) 0 mL  Liters Processed 72  Fluid Removed (mL) 1000 mL  Tolerated HD Treatment Yes  Post-Hemodialysis Comments Pt goal met.  Hemodialysis Catheter Right Subclavian Double lumen Permanent (Tunneled)  Placement Date/Time: 08/15/23 0820   Serial / Lot #: [id number]  Expiration Date: 04/27/28  Time Out: Correct patient;Correct site;Correct procedure  Maximum sterile barrier precautions: Hand hygiene;Cap;Mask;Sterile gown;Sterile gloves;Large sterile ...  Site Condition No complications  Blue Lumen Status Heparin locked  Red Lumen Status Heparin locked  Catheter fill solution Heparin 1000 units/ml  Catheter fill volume (Arterial) 1.9 cc  Catheter fill volume (Venous) 1.9  Dressing Type Transparent  Dressing Status Antimicrobial disc in place;Clean, Dry, Intact  Interventions Dressing reinforced  Drainage Description None  Dressing Change Due 09/24/23  Post treatment catheter status Capped and Clamped

## 2023-08-22 NOTE — TOC Initial Note (Signed)
Transition of Care [facility]) - Initial/Assessment Note    Patient Details  Name: Philip Benjamin MRN: [id number] Date of Birth: [birth year]  Transition of Care [facility]) CM/SW Contact:    Villa Herb, LCSWA Phone Number: 08/22/2023, 12:49 PM  Clinical Narrative:                 CSW noted per chart review that pt recently D/C to [facility] on 10/20. CSW spoke with pts daughter who states they would like for pt to return to [facility] for SNF stay. CSW spoke with Destiny in admissions who states they can accept pt back once he is medically stable. TOC to follow.   Expected Discharge Plan: Skilled Nursing Facility Barriers to Discharge: Continued Medical Work up   Patient Goals and CMS Choice Patient states their goals for this hospitalization and ongoing recovery are:: go back to SNF CMS Medicare.gov Compare Post Acute Care list provided to:: Patient Represenative (must comment) Choice offered to / list presented to : Adult Children [employer] ownership interest in [facility].provided to:: Adult Children    Expected Discharge Plan and Services In-house Referral: Clinical Social Work Discharge Planning Services: CM Consult Post Acute Care Choice: Skilled Nursing Facility Living arrangements for the past 2 months: Single Family Home                                      Prior Living Arrangements/Services Living arrangements for the past 2 months: Single Family Home Lives with:: Spouse Patient language and need for interpreter reviewed:: Yes Do you feel safe going back to the place where you live?: Yes      Need for Family Participation in Patient Care: Yes (Comment) Care giver support system in place?: Yes (comment)   Criminal Activity/Legal Involvement Pertinent to Current Situation/Hospitalization: No - Comment as needed  Activities of Daily Living      Permission Sought/Granted                  Emotional Assessment Appearance:: Appears stated  age Attitude/Demeanor/Rapport: Engaged Affect (typically observed): Accepting   Alcohol / Substance Use: Not Applicable Psych Involvement: No (comment)  Admission diagnosis:  Sepsis due to urinary tract infection (HCC) [A41.9, N39.0] Fever, unspecified fever cause [R50.9] Urinary tract infection with hematuria, site unspecified [N39.0, R31.9] Patient Active Problem List   Diagnosis Date Noted   HCAP (healthcare-associated pneumonia) 08/22/2023   Thrombocytopenia (HCC) 08/22/2023   Transaminitis 08/22/2023   Folate deficiency 08/22/2023   Type 2 diabetes mellitus with hyperglycemia (HCC) 08/22/2023   Glaucoma 08/22/2023   Sepsis due to undetermined organism (HCC) 08/22/2023   Cystitis with hematuria 08/22/2023   Urinary tract infection with hematuria 08/22/2023   Sepsis due to urinary tract infection (HCC) 08/21/2023   Malnutrition of moderate degree 08/18/2023   Renal failure 08/14/2023   End-stage renal disease on hemodialysis (HCC) 08/14/2023   Essential hypertension 08/14/2023   T2DM (type 2 diabetes mellitus) (HCC) 08/14/2023   Mixed hyperlipidemia 08/14/2023   BPH (benign prostatic hyperplasia) 08/14/2023   PCP:  Kirstie Peri, MD Pharmacy:   [facility] - [place], [place] - [address] [place] [address] Phone: [phone] Fax: [phone]  [id number] - [place], [place] - [address] AT [facility] [address] [place] [address] Phone: [phone] Fax: [phone]  Social Determinants of Health (SDOH) Social History: SDOH Screenings   Food Insecurity: No Food Insecurity (08/14/2023)  Housing: Low Risk  (08/14/2023)  Transportation Needs: No Transportation Needs (08/14/2023)  Utilities: Not At Risk (08/14/2023)  Tobacco Use: Low Risk  (08/21/2023)   SDOH Interventions:     Readmission Risk Interventions    08/22/2023   12:48 PM 08/15/2023   12:29 PM  Readmission Risk  Prevention Plan  Transportation Screening Complete Complete  Home Care Screening Complete Complete  Medication Review (RN CM) Complete Complete

## 2023-08-22 NOTE — Hospital Course (Signed)
72 year old male with a history of hypertension, diabetes mellitus, hyperlipidemia newly diagnosed ESRD from his last hospitalization presenting from [facility] rehab center secondary to fever with nausea and dry heaving.  The patient was recently hospitalized from 08/14/2023-08/20/2023 where he was declared new ESRD and initiated on dialysis.  TDC was placed on 08/15/2023, and the patient was initiated on HD.  On 08/21/2023, the patient began having symptoms of urinary frequency and urgency.  He has subjective fevers and chills.  He had some nausea and began subsequently having some dry heaving.  As result, he was sent to the emergency department for further evaluation and treatment.  The patient denied any headache, chest pain, shortness breath, cough, hemoptysis.  He denied any abdominal pain, frank dysuria, hematochezia, melena. In the ED, the patient had a fever of 101.4 F.  He was tachycardic into the 120s.  He was hemodynamically stable with oxygen saturation 96-98% on room air.  WBC 20.0, hemoglobin 9.3, platelets 135.  Sodium 132, potassium 3.6, bicarbonate 23, serum creatinine 3.4.  CT of the chest, abdomen, pelvis showed mild lingular/posterior right basilar atelectasis, mild posterior medial LLL infiltrate/atelectasis.  There was mild diffuse urinary bladder wall thickening.  There is a nonobstructive left renal calculus.  There is no bowel wall thickening or obstruction.  Blood cultures were obtained.  UA showed 21-50 WBC.  The patient was started on vancomycin and cefepime.

## 2023-08-22 NOTE — ED Notes (Signed)
ED TO INPATIENT HANDOFF REPORT  ED Nurse Name and Phone #: Fransico Him, RN [phone]  S Name/Age/Gender Philip Benjamin 72 y.o. male Room/Bed: APA04/APA04  Code Status   Code Status: Full Code  Home/SNF/Other Rehab Patient oriented to: self, place, time, and situation Is this baseline? Yes   Triage Complete: Triage complete  Chief Complaint Sepsis due to urinary tract infection (HCC) [A41.9, N39.0]  Triage Note Pt BIB [facility] from [facility] Pt complains of fever. Pt had recent port placement.    Allergies No Known Allergies  Level of Care/Admitting Diagnosis ED Disposition     ED Disposition  Admit   Condition  --   Comment  Hospital Area: [facility] [[id number]]  Level of Care: Med-Surg [16]  Covid Evaluation: Asymptomatic - no recent exposure (last 10 days) testing not required  Diagnosis: Sepsis due to urinary tract infection [facility]) [[id number]]  Admitting Physician: Frankey Shown [[id number]]  Attending Physician: Frankey Shown [[id number]]  Certification:: I certify this patient will need inpatient services for at least 2 midnights  Expected Medical Readiness: 08/24/2023          B Medical/Surgery History Past Medical History:  Diagnosis Date   BPH (benign prostatic hyperplasia)    Chronic kidney disease    ckd stage 3   Diabetes mellitus without complication (HCC)    Hypertension    Past Surgical History:  Procedure Laterality Date   EYE SURGERY     INGUINAL HERNIA REPAIR Bilateral 01/23/2018   Procedure: BILATERAL OPEN INGUINAL HERNIA REPAIR WITH MESH;  Surgeon: Jimmye Norman, MD;  Location: [facility] OR;  Service: General;  Laterality: Bilateral;   INSERTION OF MESH Bilateral 01/23/2018   Procedure: INSERTION OF MESH;  Surgeon: Jimmye Norman, MD;  Location: [facility] OR;  Service: General;  Laterality: Bilateral;   IR FLUORO GUIDE CV LINE RIGHT  08/15/2023   IR US GUIDE VASC ACCESS RIGHT  08/15/2023   VASECTOMY     ~1985     A IV  Location/Drains/Wounds Patient Lines/Drains/Airways Status     Active Line/Drains/Airways     Name Placement date Placement time Site Days   Peripheral IV 08/21/23 18 G Posterior;Left Hand 08/21/23  1705  Hand  1   Peripheral IV 08/21/23 20 G 1" Posterior;Right Forearm 08/21/23  1802  Forearm  1   Hemodialysis Catheter Right Subclavian Double lumen Permanent (Tunneled) 08/15/23  0820  Subclavian  7   External Urinary Catheter 08/22/23  0030  --  less than 1            Intake/Output Last 24 hours No intake or output data in the 24 hours ending 08/22/23 0844  Labs/Imaging Results for orders placed or performed during the hospital encounter of 08/21/23 (from the past 48 hour(s))  Resp panel by RT-PCR (RSV, Flu A&B, Covid) Anterior Nasal Swab     Status: None   Collection Time: 08/21/23  4:18 PM   Specimen: Anterior Nasal Swab  Result Value Ref Range   SARS Coronavirus 2 by RT PCR NEGATIVE NEGATIVE    Comment: (NOTE) SARS-CoV-2 target nucleic acids are NOT DETECTED.  The SARS-CoV-2 RNA is generally detectable in upper respiratory specimens during the acute phase of infection. The lowest concentration of SARS-CoV-2 viral copies this assay can detect is 138 copies/mL. A negative result does not preclude SARS-Cov-2 infection and should not be used as the sole basis for treatment or other patient management decisions. A negative result may occur with  improper specimen collection/handling, submission  of specimen other than nasopharyngeal swab, presence of viral mutation(s) within the areas targeted by this assay, and inadequate number of viral copies(<138 copies/mL). A negative result must be combined with clinical observations, patient history, and epidemiological information. The expected result is Negative.  Fact Sheet for Patients:  BloggerCourse.com  Fact Sheet for Healthcare Providers:  SeriousBroker.it  This test is no t  yet approved or cleared by the [place] FDA and  has been authorized for detection and/or diagnosis of SARS-CoV-2 by FDA under an Emergency Use Authorization (EUA). This EUA will remain  in effect (meaning this test can be used) for the duration of the COVID-19 declaration under Section 564(b)(1) of the Act, 21 U.S.C.section 360bbb-3(b)(1), unless the authorization is terminated  or revoked sooner.       Influenza A by PCR NEGATIVE NEGATIVE   Influenza B by PCR NEGATIVE NEGATIVE    Comment: (NOTE) The Xpert Xpress SARS-CoV-2/FLU/RSV plus assay is intended as an aid in the diagnosis of influenza from Nasopharyngeal swab specimens and should not be used as a sole basis for treatment. Nasal washings and aspirates are unacceptable for Xpert Xpress SARS-CoV-2/FLU/RSV testing.  Fact Sheet for Patients: BloggerCourse.com  Fact Sheet for Healthcare Providers: SeriousBroker.it  This test is not yet approved or cleared by the [place] FDA and has been authorized for detection and/or diagnosis of SARS-CoV-2 by FDA under an Emergency Use Authorization (EUA). This EUA will remain in effect (meaning this test can be used) for the duration of the COVID-19 declaration under Section 564(b)(1) of the Act, 21 U.S.C. section 360bbb-3(b)(1), unless the authorization is terminated or revoked.     Resp Syncytial Virus by PCR NEGATIVE NEGATIVE    Comment: (NOTE) Fact Sheet for Patients: BloggerCourse.com  Fact Sheet for Healthcare Providers: SeriousBroker.it  This test is not yet approved or cleared by the [place] FDA and has been authorized for detection and/or diagnosis of SARS-CoV-2 by FDA under an Emergency Use Authorization (EUA). This EUA will remain in effect (meaning this test can be used) for the duration of the COVID-19 declaration under Section 564(b)(1) of the Act, 21  U.S.C. section 360bbb-3(b)(1), unless the authorization is terminated or revoked.  Performed at [facility], [address], [place], [place] [address]   Comprehensive metabolic panel     Status: Abnormal   Collection Time: 08/21/23  6:26 PM  Result Value Ref Range   Sodium 134 (L) 135 - 145 mmol/L   Potassium 3.8 3.5 - 5.1 mmol/L   Chloride 95 (L) 98 - 111 mmol/L   CO2 23 22 - 32 mmol/L   Glucose, Bld 163 (H) 70 - 99 mg/dL    Comment: Glucose reference range applies only to samples taken after fasting for at least 8 hours.   BUN 38 (H) 8 - 23 mg/dL   Creatinine, Ser 7.25 (H) 0.61 - 1.24 mg/dL   Calcium 9.2 8.9 - 36.6 mg/dL   Total Protein 7.2 6.5 - 8.1 g/dL   Albumin 3.8 3.5 - 5.0 g/dL   AST 47 (H) 15 - 41 U/L   ALT 45 (H) 0 - 44 U/L   Alkaline Phosphatase 118 38 - 126 U/L   Total Bilirubin 0.8 0.3 - 1.2 mg/dL   GFR, Estimated 16 (L) >60 mL/min    Comment: (NOTE) Calculated using the CKD-EPI Creatinine Equation (2021)    Anion gap 16 (H) 5 - 15    Comment: Performed at [facility], [address], [place], [place] [address]  Blood culture (routine x 2)     Status: None (Preliminary result)   Collection Time: 08/21/23  6:26 PM   Specimen: BLOOD RIGHT FOREARM  Result Value Ref Range   Specimen Description BLOOD RIGHT FOREARM    Special Requests      BOTTLES DRAWN AEROBIC ONLY Blood Culture adequate volume   Culture      NO GROWTH < 12 HOURS Performed at [facility], [address], [place], [place] [address]    Report Status PENDING   Lactic acid, plasma     Status: Abnormal   Collection Time: 08/21/23  6:26 PM  Result Value Ref Range   Lactic Acid, Venous 2.0 (HH) 0.5 - 1.9 mmol/L    Comment: CRITICAL RESULT CALLED TO, READ BACK BY AND VERIFIED WITH EANES,M ON 08/21/23 AT 1910 BY LOY,C Performed at [facility], [address] [place], [place] [address]   Brain natriuretic peptide     Status: None   Collection Time: 08/21/23  6:26 PM  Result Value Ref Range   B  Natriuretic Peptide 91.0 0.0 - 100.0 pg/mL    Comment: Performed at [facility], [address] [place], [place] [address]  Blood culture (routine x 2)     Status: None (Preliminary result)   Collection Time: 08/21/23  6:35 PM   Specimen: BLOOD  Result Value Ref Range   Specimen Description BLOOD DIAL CHEST PORT    Special Requests      HEMODIALYSIS LINE BOTTLES DRAWN AEROBIC AND ANAEROBIC Blood Culture adequate volume   Culture      NO GROWTH < 12 HOURS Performed at [facility], [address], [place], [place] [address]    Report Status PENDING   Lactic acid, plasma     Status: None   Collection Time: 08/21/23  8:12 PM  Result Value Ref Range   Lactic Acid, Venous 1.4 0.5 - 1.9 mmol/L    Comment: Performed at [facility], [address], [place], [place] [address]  CBC with Differential/Platelet     Status: Abnormal   Collection Time: 08/21/23  8:12 PM  Result Value Ref Range   WBC 20.0 (H) 4.0 - 10.5 K/uL   RBC 3.26 (L) 4.22 - 5.81 MIL/uL   Hemoglobin 9.3 (L) 13.0 - 17.0 g/dL   HCT 06.3 (L) 01.6 - 01.0 %   MCV 92.3 80.0 - 100.0 fL   MCH 28.5 26.0 - 34.0 pg   MCHC 30.9 30.0 - 36.0 g/dL   RDW 93.2 35.5 - 73.2 %   Platelets 135 (L) 150 - 400 K/uL   nRBC 0.0 0.0 - 0.2 %   Neutrophils Relative % 85 %   Neutro Abs 17.0 (H) 1.7 - 7.7 K/uL   Lymphocytes Relative 2 %   Lymphs Abs 0.4 (L) 0.7 - 4.0 K/uL   Monocytes Relative 12 %   Monocytes Absolute 2.3 (H) 0.1 - 1.0 K/uL   Eosinophils Relative 0 %   Eosinophils Absolute 0.0 0.0 - 0.5 K/uL   Basophils Relative 0 %   Basophils Absolute 0.1 0.0 - 0.1 K/uL   Immature Granulocytes 1 %   Abs Immature Granulocytes 0.19 (H) 0.00 - 0.07 K/uL    Comment: Performed at [facility], [address], [place], [place] [address]  Urinalysis, Routine w reflex microscopic -Urine, Clean Catch     Status: Abnormal   Collection Time: 08/21/23  8:33 PM  Result Value Ref Range   Color, Urine YELLOW YELLOW   APPearance CLEAR  CLEAR   Specific  Gravity, Urine 1.010 1.005 - 1.030   pH 7.0 5.0 - 8.0   Glucose, UA NEGATIVE NEGATIVE mg/dL   Hgb urine dipstick SMALL (A) NEGATIVE   Bilirubin Urine NEGATIVE NEGATIVE   Ketones, ur NEGATIVE NEGATIVE mg/dL   Protein, ur 010 (A) NEGATIVE mg/dL   Nitrite NEGATIVE NEGATIVE   Leukocytes,Ua LARGE (A) NEGATIVE   RBC / HPF 11-20 0 - 5 RBC/hpf   WBC, UA 21-50 0 - 5 WBC/hpf   Bacteria, UA RARE (A) NONE SEEN   Squamous Epithelial / HPF 0-5 0 - 5 /HPF    Comment: Performed at [facility], [address], [place], [place] [address]  CBG monitoring, ED     Status: Abnormal   Collection Time: 08/22/23 12:26 AM  Result Value Ref Range   Glucose-Capillary 225 (H) 70 - 99 mg/dL    Comment: Glucose reference range applies only to samples taken after fasting for at least 8 hours.  Comprehensive metabolic panel     Status: Abnormal   Collection Time: 08/22/23  4:22 AM  Result Value Ref Range   Sodium 132 (L) 135 - 145 mmol/L   Potassium 3.6 3.5 - 5.1 mmol/L   Chloride 97 (L) 98 - 111 mmol/L   CO2 23 22 - 32 mmol/L   Glucose, Bld 169 (H) 70 - 99 mg/dL    Comment: Glucose reference range applies only to samples taken after fasting for at least 8 hours.   BUN 41 (H) 8 - 23 mg/dL   Creatinine, Ser 6.64 (H) 0.61 - 1.24 mg/dL   Calcium 8.8 (L) 8.9 - 10.3 mg/dL   Total Protein 6.3 (L) 6.5 - 8.1 g/dL   Albumin 3.2 (L) 3.5 - 5.0 g/dL   AST 36 15 - 41 U/L   ALT 35 0 - 44 U/L   Alkaline Phosphatase 102 38 - 126 U/L   Total Bilirubin 1.0 0.3 - 1.2 mg/dL   GFR, Estimated 16 (L) >60 mL/min    Comment: (NOTE) Calculated using the CKD-EPI Creatinine Equation (2021)    Anion gap 12 5 - 15    Comment: Performed at [facility], [address], [place], [place] [address]  CBC     Status: Abnormal   Collection Time: 08/22/23  4:22 AM  Result Value Ref Range   WBC 28.8 (H) 4.0 - 10.5 K/uL    Comment: REPEATED TO VERIFY [place] COUNT CONFIRMED ON SMEAR    RBC 3.10 (L) 4.22 - 5.81 MIL/uL   Hemoglobin 9.0 (L)  13.0 - 17.0 g/dL   HCT 42.5 (L) 95.6 - 38.7 %   MCV 91.9 80.0 - 100.0 fL   MCH 29.0 26.0 - 34.0 pg   MCHC 31.6 30.0 - 36.0 g/dL   RDW 56.4 33.2 - 95.1 %   Platelets 142 (L) 150 - 400 K/uL    Comment: REPEATED TO VERIFY   nRBC 0.0 0.0 - 0.2 %    Comment: Performed at [facility], [address], [place], [place] [address]  Magnesium     Status: Abnormal   Collection Time: 08/22/23  4:22 AM  Result Value Ref Range   Magnesium 1.6 (L) 1.7 - 2.4 mg/dL    Comment: Performed at [facility], [address] [place], [place] [address]  Phosphorus     Status: None   Collection Time: 08/22/23  4:22 AM  Result Value Ref Range   Phosphorus 2.7 2.5 - 4.6 mg/dL    Comment: Performed  at [facility], [address], [place], [place] [address]  Procalcitonin     Status: None   Collection Time: 08/22/23  4:22 AM  Result Value Ref Range   Procalcitonin 14.27 ng/mL    Comment:        Interpretation: PCT >= 10 ng/mL: Important systemic inflammatory response, almost exclusively due to severe bacterial sepsis or septic shock. (NOTE)       Sepsis PCT Algorithm           Lower Respiratory Tract                                      Infection PCT Algorithm    ----------------------------     ----------------------------         PCT < 0.25 ng/mL                PCT < 0.10 ng/mL          Strongly encourage             Strongly discourage   discontinuation of antibiotics    initiation of antibiotics    ----------------------------     -----------------------------       PCT 0.25 - 0.50 ng/mL            PCT 0.10 - 0.25 ng/mL               OR       >80% decrease in PCT            Discourage initiation of                                            antibiotics      Encourage discontinuation           of antibiotics    ----------------------------     -----------------------------         PCT >= 0.50 ng/mL              PCT 0.26 - 0.50 ng/mL                AND       <80% decrease in PCT              Encourage initiation of                                             antibiotics       Encourage continuation           of antibiotics    ----------------------------     -----------------------------        PCT >= 0.50 ng/mL                  PCT > 0.50 ng/mL               AND         increase in PCT                  Strongly encourage  initiation of antibiotics    Strongly encourage escalation           of antibiotics                                     -----------------------------                                           PCT <= 0.25 ng/mL                                                 OR                                        > 80% decrease in PCT                                      Discontinue / Do not initiate                                             antibiotics  Performed at [facility], [address], [place], [place] [address]    CT CHEST ABDOMEN PELVIS WO CONTRAST  Result Date: 08/21/2023 CLINICAL DATA:  Sepsis. EXAM: CT CHEST, ABDOMEN AND PELVIS WITHOUT CONTRAST TECHNIQUE: Multidetector CT imaging of the chest, abdomen and pelvis was performed following the standard protocol without IV contrast. RADIATION DOSE REDUCTION: This exam was performed according to the departmental dose-optimization program which includes automated exposure control, adjustment of the mA and/or kV according to patient size and/or use of iterative reconstruction technique. COMPARISON:  None Available. FINDINGS: CT CHEST FINDINGS Cardiovascular: A right-sided venous catheter is in place. There is mild calcification of the aortic arch, without evidence of aortic aneurysm. Normal heart size with moderate severity coronary artery calcification. No pericardial effusion. Mediastinum/Nodes: No enlarged mediastinal, hilar, or axillary lymph nodes. Thyroid gland, trachea, and esophagus demonstrate no significant findings. Lungs/Pleura: Mild lingular and posterior right  basilar linear scarring and/or atelectasis is seen. Mild posteromedial left lower lobe atelectasis and/or infiltrate is noted. There is mild elevation of the left hemidiaphragm. No pleural effusion or pneumothorax is identified. Musculoskeletal: Multilevel degenerative changes are seen throughout the thoracic spine CT ABDOMEN PELVIS FINDINGS Hepatobiliary: No focal liver abnormality is seen. The gallbladder is moderately distended without evidence of gallstones, gallbladder wall thickening, or biliary dilatation. Pancreas: Unremarkable. No pancreatic ductal dilatation or surrounding inflammatory changes. Spleen: Normal in size without focal abnormality. Adrenals/Urinary Tract: Adrenal glands are unremarkable. Kidneys are normal, without obstructing renal calculi, focal lesion, or hydronephrosis. A 1 mm nonobstructing renal calculus is seen within the mid to upper left kidney. Mild, diffuse urinary bladder wall thickening is noted. Stomach/Bowel: Stomach is within normal limits. Appendix appears normal. No evidence of bowel wall thickening, distention, or inflammatory changes. Noninflamed diverticula are seen throughout the large bowel Vascular/Lymphatic: Aortic atherosclerosis. No enlarged abdominal or pelvic  lymph nodes. Reproductive: The prostate gland is mildly enlarged. Other: No abdominal wall hernia or abnormality. No abdominopelvic ascites. Musculoskeletal: Multilevel degenerative changes are seen throughout the lumbar spine. IMPRESSION: 1. Mild posteromedial left lower lobe atelectasis and/or infiltrate. 2. Mild lingular and posterior right basilar linear scarring and/or atelectasis. 3. Colonic diverticulosis. 4. 1 mm nonobstructing left renal calculus. 5. Aortic atherosclerosis. Aortic Atherosclerosis (ICD10-I70.0). Electronically Signed   By: Aram Candela M.D.   On: 08/21/2023 22:41    Pending Labs Unresulted Labs (From admission, onward)     Start     Ordered   08/22/23 1000  MRSA Next Gen by  PCR, Nasal  (MRSA Screening)  Once,   R        08/22/23 0841   08/22/23 0811  Hepatitis B surface antigen  (New Admission Hemo Labs (Hepatitis B))  Add-on,   AD        08/22/23 0811   08/22/23 0811  Hepatitis B surface antibody,quantitative  (New Admission Hemo Labs (Hepatitis B))  Add-on,   AD        08/22/23 0811   08/21/23 2342  Legionella Pneumophila Serogp 1 Ur Ag  Once,   R        08/21/23 2341   08/21/23 2341  Expectorated Sputum Assessment w Gram Stain, Rflx to Resp Cult  Once,   R        08/21/23 2341   08/21/23 2341  Strep pneumoniae urinary antigen  Once,   R        08/21/23 2341   08/21/23 2033  Urine Culture  Once,   R        08/21/23 2033   08/21/23 1729  CBC with Differential  Once,   STAT        08/21/23 1728            Vitals/Pain Today's Vitals   08/22/23 0750 08/22/23 0800 08/22/23 0830 08/22/23 0844  BP:  118/63 125/62   Pulse:  96 97   Resp:  18 12   Temp:    97.8 F (36.6 C)  TempSrc:    Oral  SpO2:  94% 100%   Weight:      Height:      PainSc: 0-No pain       Isolation Precautions No active isolations  Medications Medications  vancomycin (VANCOCIN) IVPB 1000 mg/200 mL premix (has no administration in time range)  heparin injection 5,000 Units (5,000 Units Subcutaneous Given 08/22/23 0521)  acetaminophen (TYLENOL) tablet 650 mg (650 mg Oral Given 08/22/23 0517)    Or  acetaminophen (TYLENOL) suppository 650 mg ( Rectal See Alternative 08/22/23 0517)  ondansetron (ZOFRAN) tablet 4 mg ( Oral See Alternative 08/22/23 0510)    Or  ondansetron (ZOFRAN) injection 4 mg (4 mg Intravenous Given 08/22/23 0510)  dextromethorphan-guaiFENesin (MUCINEX DM) 30-600 MG per 12 hr tablet 1 tablet (1 tablet Oral Given 08/22/23 0035)  metoprolol tartrate (LOPRESSOR) tablet 12.5 mg (has no administration in time range)  tamsulosin (FLOMAX) capsule 0.4 mg (has no administration in time range)  folic acid (FOLVITE) tablet 1 mg (has no administration in time range)   cycloSPORINE (RESTASIS) 0.05 % ophthalmic emulsion 1 drop (has no administration in time range)  insulin glargine-yfgn (SEMGLEE) injection 10 Units (has no administration in time range)  insulin aspart (novoLOG) injection 0-6 Units (has no administration in time range)  insulin aspart (novoLOG) injection 0-5 Units (2 Units Subcutaneous Given 08/22/23 0036)  Chlorhexidine Gluconate Cloth 2 %  PADS 6 each (has no administration in time range)  ceFEPIme (MAXIPIME) 1 g in sodium chloride 0.9 % 100 mL IVPB (has no administration in time range)  ceFEPIme (MAXIPIME) 2 g in sodium chloride 0.9 % 100 mL IVPB (0 g Intravenous Stopped 08/21/23 1951)  vancomycin (VANCOCIN) IVPB 1000 mg/200 mL premix (0 mg Intravenous Stopped 08/21/23 1951)    Followed by  vancomycin (VANCOCIN) IVPB 1000 mg/200 mL premix (0 mg Intravenous Stopped 08/21/23 2151)  lactated ringers bolus 1,000 mL (0 mLs Intravenous Stopped 08/21/23 2130)    Mobility walks with device     Focused Assessments     R Recommendations: See Admitting Provider Note  Report given to: Crystal, RN AP unit 300   Additional Notes:

## 2023-08-22 NOTE — ED Notes (Signed)
During rounds, patient begins drive heaving and states he felt like he was going to vomit. Patient's PRN nausea medicine pulled and given.

## 2023-08-23 DIAGNOSIS — N186 End stage renal disease: Secondary | ICD-10-CM | POA: Diagnosis not present

## 2023-08-23 DIAGNOSIS — A419 Sepsis, unspecified organism: Secondary | ICD-10-CM | POA: Diagnosis not present

## 2023-08-23 DIAGNOSIS — A4152 Sepsis due to Pseudomonas: Secondary | ICD-10-CM | POA: Insufficient documentation

## 2023-08-23 DIAGNOSIS — D696 Thrombocytopenia, unspecified: Secondary | ICD-10-CM | POA: Diagnosis not present

## 2023-08-23 LAB — MAGNESIUM: Magnesium: 1.6 mg/dL — ABNORMAL LOW (ref 1.7–2.4)

## 2023-08-23 LAB — COMPREHENSIVE METABOLIC PANEL
ALT: 30 U/L (ref 0–44)
AST: 28 U/L (ref 15–41)
Albumin: 2.9 g/dL — ABNORMAL LOW (ref 3.5–5.0)
Alkaline Phosphatase: 95 U/L (ref 38–126)
Anion gap: 10 (ref 5–15)
BUN: 30 mg/dL — ABNORMAL HIGH (ref 8–23)
CO2: 26 mmol/L (ref 22–32)
Calcium: 8.6 mg/dL — ABNORMAL LOW (ref 8.9–10.3)
Chloride: 97 mmol/L — ABNORMAL LOW (ref 98–111)
Creatinine, Ser: 2.96 mg/dL — ABNORMAL HIGH (ref 0.61–1.24)
GFR, Estimated: 22 mL/min — ABNORMAL LOW (ref >60)
Glucose, Bld: 130 mg/dL — ABNORMAL HIGH (ref 70–99)
Potassium: 3.5 mmol/L (ref 3.5–5.1)
Sodium: 133 mmol/L — ABNORMAL LOW (ref 135–145)
Total Bilirubin: 1.1 mg/dL (ref 0.3–1.2)
Total Protein: 6 g/dL — ABNORMAL LOW (ref 6.5–8.1)

## 2023-08-23 LAB — CBC
HCT: 26.9 % — ABNORMAL LOW (ref 39.0–52.0)
Hemoglobin: 8.7 g/dL — ABNORMAL LOW (ref 13.0–17.0)
MCH: 29.6 pg (ref 26.0–34.0)
MCHC: 32.3 g/dL (ref 30.0–36.0)
MCV: 91.5 fL (ref 80.0–100.0)
Platelets: 131 10*3/uL — ABNORMAL LOW (ref 150–400)
RBC: 2.94 MIL/uL — ABNORMAL LOW (ref 4.22–5.81)
RDW: 13 % (ref 11.5–15.5)
WBC: 23.3 10*3/uL — ABNORMAL HIGH (ref 4.0–10.5)
nRBC: 0 % (ref 0.0–0.2)

## 2023-08-23 LAB — GLUCOSE, CAPILLARY
Glucose-Capillary: 132 mg/dL — ABNORMAL HIGH (ref 70–99)
Glucose-Capillary: 170 mg/dL — ABNORMAL HIGH (ref 70–99)
Glucose-Capillary: 223 mg/dL — ABNORMAL HIGH (ref 70–99)
Glucose-Capillary: 194 mg/dL — ABNORMAL HIGH (ref 70–99)

## 2023-08-23 LAB — HEPATITIS B SURFACE ANTIBODY, QUANTITATIVE: Hep B S AB Quant (Post): 87 m[IU]/mL

## 2023-08-23 LAB — C DIFFICILE QUICK SCREEN W PCR REFLEX
C Diff antigen: NEGATIVE
C Diff interpretation: NOT DETECTED
C Diff toxin: NEGATIVE

## 2023-08-23 NOTE — Plan of Care (Signed)

## 2023-08-23 NOTE — Progress Notes (Addendum)
PROGRESS NOTE  Philip Benjamin MVH:[id number] DOB: Nov 13, 1950 DOA: 08/21/2023 PCP: Kirstie Peri, MD  Brief History:  72 year old male with a history of hypertension, diabetes mellitus, hyperlipidemia newly diagnosed ESRD from his last hospitalization presenting from [facility] rehab center secondary to fever with nausea and dry heaving.  The patient was recently hospitalized from 08/14/2023-08/20/2023 where he was declared new ESRD and initiated on dialysis.  TDC was placed on 08/15/2023, and the patient was initiated on HD.  On 08/21/2023, the patient began having symptoms of urinary frequency and urgency.  He has subjective fevers and chills.  He had some nausea and began subsequently having some dry heaving.  As result, he was sent to the emergency department for further evaluation and treatment.  The patient denied any headache, chest pain, shortness breath, cough, hemoptysis.  He denied any abdominal pain, frank dysuria, hematochezia, melena. In the ED, the patient had a fever of 101.4 F.  He was tachycardic into the 120s.  He was hemodynamically stable with oxygen saturation 96-98% on room air.  WBC 20.0, hemoglobin 9.3, platelets 135.  Sodium 132, potassium 3.6, bicarbonate 23, serum creatinine 3.4.  CT of the chest, abdomen, pelvis showed mild lingular/posterior right basilar atelectasis, mild posterior medial LLL infiltrate/atelectasis.  There was mild diffuse urinary bladder wall thickening.  There is a nonobstructive left renal calculus.  There is no bowel wall thickening or obstruction.  Blood cultures were obtained.  UA showed 21-50 WBC.  The patient was started on vancomycin and cefepime.   Assessment/Plan: Severe sepsis -Presented with fever, tachycardia, leukocytosis -Secondary to urinary source and possible pneumonia -PCT 14.27 -UA 21-50 WBC -initially started vancomycin and cefepime pending culture data -Follow blood culture--neg to date -08/21/2023 CT CAP--as discussed  above in the history -Lactic acid 2.0>> 1.4   UTI--pseudomonas -d/c vanco -continue cefepime   Lobar pneumonia -08/21/2023 CT chest as discussed above -Add azithromycin -MRSA screen--neg -COVID-19 PCR negative -initially on empiric vancomycin and cefepime pending culture data -d/c vanco   ESRD -Nephrology consulted for maintenance dialysis -HD on 11/1   Thrombocytopenia -Secondary to sepsis -Anticipate improvement with treatment of sepsis   Transaminasemia -Secondary to sepsis and hemodynamic changes -No abdominal pain presently -08/21/2023 CT abdomen negative for gallbladder wall thickening or liver abnormalities -Trend LFTs>>improved   BPH -Continue tamsulosin   Diabetes mellitus type 2 -08/14/2023 hemoglobin A1c 5.6 -Continue NovoLog sliding scale -Continue reduced dose Semglee   Essential hypertension -Restart metoprolol -His losartan and diltiazem were discontinued during his last hospital admission secondary to soft BPs   Hyperlipidemia -Continue statin   Anemia of CKD:  - Hgb stable in 8 range, no bleeding. On ESA per nephrology.   Hypomagnesemia -replete   Diarrhea -cdiff neg       Family Communication: no  Family at bedside   Consultants:  renal   Code Status:  FULL    DVT Prophylaxis:  [place] Heparin      Procedures: As Listed in Progress Note Above   Antibiotics: Vanc 10/31>>11/1 Cefepime 10/31>>        Subjective: Patient denies fevers, chills, headache, chest pain, dyspnea, nausea, vomiting, diarrhea, abdominal pain, dysuria, hematuria, hematochezia, and melena. Pt states urine urgency sensation is improving  Objective: Vitals:   08/22/23 1534 08/22/23 2015 08/23/23 0935 08/23/23 1257  BP: 119/71 106/65 (!) 113/59 107/63  Pulse: 98 99 (!) 103 87  Resp: 15 18 18 18   Temp: 98.5 F (36.9 C) 99.5  F (37.5 C)  98.3 F (36.8 C)  TempSrc: Oral     SpO2: 98% 100% 98% 100%  Weight:      Height:        Intake/Output  Summary (Last 24 hours) at 08/23/2023 1733 Last data filed at 08/23/2023 1300 Gross per 24 hour  Intake 712.76 ml  Output 500 ml  Net 212.76 ml   Weight change: 0.1 kg Exam:  General:  Pt is alert, follows commands appropriately, not in acute distress HEENT: No icterus, No thrush, No neck mass, [place]/AT Cardiovascular: RRR, S1/S2, no rubs, no gallops Respiratory: bibasilar rales. No wheeze Abdomen: Soft/+BS, non tender, non distended, no guarding Extremities: trace nonpitting edema, No lymphangitis, No petechiae, No rashes, no synovitis   Data Reviewed: I have personally reviewed following labs and imaging studies Basic Metabolic Panel: Recent Labs  Lab 08/18/23 0459 08/19/23 0428 08/20/23 0421 08/21/23 1826 08/22/23 0422 08/22/23 1401 08/23/23 0425  NA 131* 131* 131* 134* 132*  --  133*  K 3.8 3.7 3.6 3.8 3.6  --  3.5  CL 95* 95* 94* 95* 97*  --  97*  CO2 22 27 26 23 23   --  26  GLUCOSE 119* 103* 93 163* 169*  --  130*  BUN 55* 39* 51* 38* 41*  --  30*  CREATININE 4.96* 3.73* 4.46* 3.71* 3.84*  --  2.96*  CALCIUM 8.9 8.7* 9.1 9.2 8.8*  --  8.6*  MG  --   --   --   --  1.6*  --  1.6*  PHOS 4.7* 3.3 3.8  --  2.7 1.8*  --    Liver Function Tests: Recent Labs  Lab 08/19/23 0428 08/20/23 0421 08/21/23 1826 08/22/23 0422 08/23/23 0425  AST  --   --  47* 36 28  ALT  --   --  45* 35 30  ALKPHOS  --   --  118 102 95  BILITOT  --   --  0.8 1.0 1.1  PROT  --   --  7.2 6.3* 6.0*  ALBUMIN 3.2* 3.4* 3.8 3.2* 2.9*   No results for input(s): "LIPASE", "AMYLASE" in the last 168 hours. No results for input(s): "AMMONIA" in the last 168 hours. Coagulation Profile: No results for input(s): "INR", "PROTIME" in the last 168 hours. CBC: Recent Labs  Lab 08/18/23 1513 08/21/23 2012 08/22/23 0422 08/23/23 0425  WBC 7.9 20.0* 28.8* 23.3*  NEUTROABS  --  17.0*  --   --   HGB 8.7* 9.3* 9.0* 8.7*  HCT 29.4* 30.1* 28.5* 26.9*  MCV 100.7* 92.3 91.9 91.5  PLT 150 135* 142* 131*    Cardiac Enzymes: No results for input(s): "CKTOTAL", "CKMB", "CKMBINDEX", "TROPONINI" in the last 168 hours. BNP: Invalid input(s): "POCBNP" CBG: Recent Labs  Lab 08/22/23 1626 08/22/23 2120 08/23/23 0744 08/23/23 1132 08/23/23 1554  GLUCAP 124* 230* 132* 170* 223*   HbA1C: No results for input(s): "HGBA1C" in the last 72 hours. Urine analysis:    Component Value Date/Time   COLORURINE YELLOW 08/21/2023 2033   APPEARANCEUR CLEAR 08/21/2023 2033   LABSPEC 1.010 08/21/2023 2033   PHURINE 7.0 08/21/2023 2033   GLUCOSEU NEGATIVE 08/21/2023 2033   HGBUR SMALL (A) 08/21/2023 2033   BILIRUBINUR NEGATIVE 08/21/2023 2033   KETONESUR NEGATIVE 08/21/2023 2033   PROTEINUR 100 (A) 08/21/2023 2033   NITRITE NEGATIVE 08/21/2023 2033   LEUKOCYTESUR LARGE (A) 08/21/2023 2033   Sepsis Labs: @LABRCNTIP (procalcitonin:4,lacticidven:4) ) Recent Results (from the past 240 hour(s))  Resp panel by RT-PCR (RSV, Flu A&B, Covid) Anterior Nasal Swab     Status: None   Collection Time: 08/21/23  4:18 PM   Specimen: Anterior Nasal Swab  Result Value Ref Range Status   SARS Coronavirus 2 by RT PCR NEGATIVE NEGATIVE Final    Comment: (NOTE) SARS-CoV-2 target nucleic acids are NOT DETECTED.  The SARS-CoV-2 RNA is generally detectable in upper respiratory specimens during the acute phase of infection. The lowest concentration of SARS-CoV-2 viral copies this assay can detect is 138 copies/mL. A negative result does not preclude SARS-Cov-2 infection and should not be used as the sole basis for treatment or other patient management decisions. A negative result may occur with  improper specimen collection/handling, submission of specimen other than nasopharyngeal swab, presence of viral mutation(s) within the areas targeted by this assay, and inadequate number of viral copies(<138 copies/mL). A negative result must be combined with clinical observations, patient history, and  epidemiological information. The expected result is Negative.  Fact Sheet for Patients:  BloggerCourse.com  Fact Sheet for Healthcare Providers:  SeriousBroker.it  This test is no t yet approved or cleared by the [place] FDA and  has been authorized for detection and/or diagnosis of SARS-CoV-2 by FDA under an Emergency Use Authorization (EUA). This EUA will remain  in effect (meaning this test can be used) for the duration of the COVID-19 declaration under Section 564(b)(1) of the Act, 21 U.S.C.section 360bbb-3(b)(1), unless the authorization is terminated  or revoked sooner.       Influenza A by PCR NEGATIVE NEGATIVE Final   Influenza B by PCR NEGATIVE NEGATIVE Final    Comment: (NOTE) The Xpert Xpress SARS-CoV-2/FLU/RSV plus assay is intended as an aid in the diagnosis of influenza from Nasopharyngeal swab specimens and should not be used as a sole basis for treatment. Nasal washings and aspirates are unacceptable for Xpert Xpress SARS-CoV-2/FLU/RSV testing.  Fact Sheet for Patients: BloggerCourse.com  Fact Sheet for Healthcare Providers: SeriousBroker.it  This test is not yet approved or cleared by the [place] FDA and has been authorized for detection and/or diagnosis of SARS-CoV-2 by FDA under an Emergency Use Authorization (EUA). This EUA will remain in effect (meaning this test can be used) for the duration of the COVID-19 declaration under Section 564(b)(1) of the Act, 21 U.S.C. section 360bbb-3(b)(1), unless the authorization is terminated or revoked.     Resp Syncytial Virus by PCR NEGATIVE NEGATIVE Final    Comment: (NOTE) Fact Sheet for Patients: BloggerCourse.com  Fact Sheet for Healthcare Providers: SeriousBroker.it  This test is not yet approved or cleared by the [place] FDA and has been  authorized for detection and/or diagnosis of SARS-CoV-2 by FDA under an Emergency Use Authorization (EUA). This EUA will remain in effect (meaning this test can be used) for the duration of the COVID-19 declaration under Section 564(b)(1) of the Act, 21 U.S.C. section 360bbb-3(b)(1), unless the authorization is terminated or revoked.  Performed at [facility], [address], [place], [place] [address]   Blood culture (routine x 2)     Status: None (Preliminary result)   Collection Time: 08/21/23  6:26 PM   Specimen: BLOOD RIGHT FOREARM  Result Value Ref Range Status   Specimen Description BLOOD RIGHT FOREARM  Final   Special Requests   Final    BOTTLES DRAWN AEROBIC ONLY Blood Culture adequate volume   Culture   Final    NO GROWTH 2 DAYS Performed at [facility], [address],  [place], [place] [address]    Report Status PENDING  Incomplete  Blood culture (routine x 2)     Status: None (Preliminary result)   Collection Time: 08/21/23  6:35 PM   Specimen: BLOOD  Result Value Ref Range Status   Specimen Description BLOOD DIAL CHEST PORT  Final   Special Requests   Final    HEMODIALYSIS LINE BOTTLES DRAWN AEROBIC AND ANAEROBIC Blood Culture adequate volume   Culture   Final    NO GROWTH 2 DAYS Performed at [facility], [address], [place], [place] [address]    Report Status PENDING  Incomplete  Urine Culture     Status: Abnormal (Preliminary result)   Collection Time: 08/21/23  8:33 PM   Specimen: Urine, Catheterized  Result Value Ref Range Status   Specimen Description   Final    URINE, CATHETERIZED Performed at [facility], [address] [place], [place] [address]    Special Requests   Final    NONE Performed at [facility], [address], [place], [place] [address]    Culture (A)  Final    80,000 COLONIES/mL PSEUDOMONAS AERUGINOSA SUSCEPTIBILITIES TO FOLLOW Performed at [facility], [address] [place], [place] [address]    Report Status PENDING   Incomplete  MRSA Next Gen by PCR, Nasal     Status: None   Collection Time: 08/22/23  4:30 PM   Specimen: Nasal Mucosa; Nasal Swab  Result Value Ref Range Status   MRSA by PCR Next Gen NOT DETECTED NOT DETECTED Final    Comment: (NOTE) The GeneXpert MRSA Assay (FDA approved for NASAL specimens only), is one component of a comprehensive MRSA colonization surveillance program. It is not intended to diagnose MRSA infection nor to guide or monitor treatment for MRSA infections. Test performance is not FDA approved in patients less than 85 years old. Performed at [facility], [address], [place], [place] [address]   C Difficile Quick Screen w PCR reflex     Status: None   Collection Time: 08/23/23  5:25 AM   Specimen: STOOL  Result Value Ref Range Status   C Diff antigen NEGATIVE NEGATIVE Final   C Diff toxin NEGATIVE NEGATIVE Final   C Diff interpretation No C. difficile detected.  Final    Comment: Performed at [facility], [address] [place], [place] [address]     Scheduled Meds:  azithromycin  500 mg Oral Daily   Chlorhexidine Gluconate Cloth  6 each Topical Q0600   cycloSPORINE  1 drop Both Eyes BID   dextromethorphan-guaiFENesin  1 tablet Oral BID   folic acid  1,000 mcg Oral Daily   heparin  5,000 Units Subcutaneous Q8H   insulin aspart  0-5 Units Subcutaneous QHS   insulin aspart  0-6 Units Subcutaneous TID WC   insulin glargine-yfgn  10 Units Subcutaneous QHS   metoprolol tartrate  12.5 mg Oral BID   tamsulosin  0.4 mg Oral Q supper   Continuous Infusions:  ceFEPime (MAXIPIME) IV 1 g (08/22/23 1813)    Procedures/Studies: CT CHEST ABDOMEN PELVIS WO CONTRAST  Result Date: 08/21/2023 CLINICAL DATA:  Sepsis. EXAM: CT CHEST, ABDOMEN AND PELVIS WITHOUT CONTRAST TECHNIQUE: Multidetector CT imaging of the chest, abdomen and pelvis was performed following the standard protocol without IV contrast. RADIATION DOSE REDUCTION: This exam was performed according to the  departmental dose-optimization program which includes automated exposure control, adjustment of the mA and/or kV according to patient size and/or use of  iterative reconstruction technique. COMPARISON:  None Available. FINDINGS: CT CHEST FINDINGS Cardiovascular: A right-sided venous catheter is in place. There is mild calcification of the aortic arch, without evidence of aortic aneurysm. Normal heart size with moderate severity coronary artery calcification. No pericardial effusion. Mediastinum/Nodes: No enlarged mediastinal, hilar, or axillary lymph nodes. Thyroid gland, trachea, and esophagus demonstrate no significant findings. Lungs/Pleura: Mild lingular and posterior right basilar linear scarring and/or atelectasis is seen. Mild posteromedial left lower lobe atelectasis and/or infiltrate is noted. There is mild elevation of the left hemidiaphragm. No pleural effusion or pneumothorax is identified. Musculoskeletal: Multilevel degenerative changes are seen throughout the thoracic spine CT ABDOMEN PELVIS FINDINGS Hepatobiliary: No focal liver abnormality is seen. The gallbladder is moderately distended without evidence of gallstones, gallbladder wall thickening, or biliary dilatation. Pancreas: Unremarkable. No pancreatic ductal dilatation or surrounding inflammatory changes. Spleen: Normal in size without focal abnormality. Adrenals/Urinary Tract: Adrenal glands are unremarkable. Kidneys are normal, without obstructing renal calculi, focal lesion, or hydronephrosis. A 1 mm nonobstructing renal calculus is seen within the mid to upper left kidney. Mild, diffuse urinary bladder wall thickening is noted. Stomach/Bowel: Stomach is within normal limits. Appendix appears normal. No evidence of bowel wall thickening, distention, or inflammatory changes. Noninflamed diverticula are seen throughout the large bowel Vascular/Lymphatic: Aortic atherosclerosis. No enlarged abdominal or pelvic lymph nodes. Reproductive: The  prostate gland is mildly enlarged. Other: No abdominal wall hernia or abnormality. No abdominopelvic ascites. Musculoskeletal: Multilevel degenerative changes are seen throughout the lumbar spine. IMPRESSION: 1. Mild posteromedial left lower lobe atelectasis and/or infiltrate. 2. Mild lingular and posterior right basilar linear scarring and/or atelectasis. 3. Colonic diverticulosis. 4. 1 mm nonobstructing left renal calculus. 5. Aortic atherosclerosis. Aortic Atherosclerosis (ICD10-I70.0). Electronically Signed   By: Aram Candela M.D.   On: 08/21/2023 22:41   IR Fluoro Guide CV Line Right  Result Date: 08/15/2023 INDICATION: Renal failure EXAM: Ultrasound and fluoroscopy guided tunneled hemodialysis catheter placement MEDICATIONS: Ancef 2 g IV; The antibiotic was administered within an appropriate time interval prior to skin puncture. ANESTHESIA/SEDATION: Moderate (conscious) sedation was employed during this procedure. A total of Versed 0.5 mg and Fentanyl 25 mcg was administered intravenously by the radiology nurse. Total intra-service moderate Sedation Time: 10 minutes. The patient's level of consciousness and vital signs were monitored continuously by radiology nursing throughout the procedure under my direct supervision. FLUOROSCOPY: Radiation Exposure Index (as provided by the fluoroscopic device): 3 mGy Kerma COMPLICATIONS: None immediate. PROCEDURE: Informed written consent was obtained from the patient after a thorough discussion of the procedural risks, benefits and alternatives. All questions were addressed. Maximal Sterile Barrier Technique was utilized including caps, mask, sterile gowns, sterile gloves, sterile drape, hand hygiene and skin antiseptic. A timeout was performed prior to the initiation of the procedure. The right internal jugular vein was evaluated with ultrasound and shown to be patent. A permanent ultrasound image was obtained and placed in the patient's medical record. Using  sterile gel and a sterile probe cover, the right internal jugular vein was entered with a 21 ga needle during real time ultrasound guidance. 0.018 inch guidewire placed and 21 ga needle exchanged for transitional dilator set. Utilizing fluoroscopy, 0.035 inch guidewire advanced through the dilator without difficulty. Seriel dilation was performed and peel-away sheath was placed. Attention then turned to the right anterior upper chest. Following local lidocaine administration, the hemodialysis catheter was tunneled from the chest wall to the venotomy site. The catheter was inserted through the peel-away sheath. The tip of the  catheter was positioned within the right atrium using fluoroscopic guidance. All lumens of the catheter aspirated and flushed well. The dialysis lumens were locked with Heparin. The catheter was secured to the skin with suture. The insertion site was covered with sterile dressing. IMPRESSION: 23 cm tunneled right IJ hemodialysis catheter is ready for use. Electronically Signed   By: Acquanetta Belling M.D.   On: 08/15/2023 15:59   IR US Guide Vasc Access Right  Result Date: 08/15/2023 INDICATION: Renal failure EXAM: Ultrasound and fluoroscopy guided tunneled hemodialysis catheter placement MEDICATIONS: Ancef 2 g IV; The antibiotic was administered within an appropriate time interval prior to skin puncture. ANESTHESIA/SEDATION: Moderate (conscious) sedation was employed during this procedure. A total of Versed 0.5 mg and Fentanyl 25 mcg was administered intravenously by the radiology nurse. Total intra-service moderate Sedation Time: 10 minutes. The patient's level of consciousness and vital signs were monitored continuously by radiology nursing throughout the procedure under my direct supervision. FLUOROSCOPY: Radiation Exposure Index (as provided by the fluoroscopic device): 3 mGy Kerma COMPLICATIONS: None immediate. PROCEDURE: Informed written consent was obtained from the patient after a  thorough discussion of the procedural risks, benefits and alternatives. All questions were addressed. Maximal Sterile Barrier Technique was utilized including caps, mask, sterile gowns, sterile gloves, sterile drape, hand hygiene and skin antiseptic. A timeout was performed prior to the initiation of the procedure. The right internal jugular vein was evaluated with ultrasound and shown to be patent. A permanent ultrasound image was obtained and placed in the patient's medical record. Using sterile gel and a sterile probe cover, the right internal jugular vein was entered with a 21 ga needle during real time ultrasound guidance. 0.018 inch guidewire placed and 21 ga needle exchanged for transitional dilator set. Utilizing fluoroscopy, 0.035 inch guidewire advanced through the dilator without difficulty. Seriel dilation was performed and peel-away sheath was placed. Attention then turned to the right anterior upper chest. Following local lidocaine administration, the hemodialysis catheter was tunneled from the chest wall to the venotomy site. The catheter was inserted through the peel-away sheath. The tip of the catheter was positioned within the right atrium using fluoroscopic guidance. All lumens of the catheter aspirated and flushed well. The dialysis lumens were locked with Heparin. The catheter was secured to the skin with suture. The insertion site was covered with sterile dressing. IMPRESSION: 23 cm tunneled right IJ hemodialysis catheter is ready for use. Electronically Signed   By: Acquanetta Belling M.D.   On: 08/15/2023 15:59   DG Chest Port 1 View  Result Date: 08/14/2023 CLINICAL DATA:  Weakness.  Lower extremity edema. EXAM: PORTABLE CHEST 1 VIEW COMPARISON:  05/20/2023 FINDINGS: Stable cardiomediastinal contours. Low lung volumes. Eventration of the hemidiaphragms. Unchanged scarring and volume loss identified within the left base. No airspace consolidation, pleural effusions, or interstitial edema. No  pneumothorax. IMPRESSION: Low lung volumes and left base scarring. No acute findings. Electronically Signed   By: Signa Kell M.D.   On: 08/14/2023 05:29    Catarina Hartshorn, DO  [facility]  If 7PM-7AM, please contact night-coverage [email or web address] Password [facility] 08/23/2023, 5:33 PM   LOS: 2 days

## 2023-08-24 DIAGNOSIS — A419 Sepsis, unspecified organism: Secondary | ICD-10-CM | POA: Diagnosis not present

## 2023-08-24 DIAGNOSIS — Z992 Dependence on renal dialysis: Secondary | ICD-10-CM | POA: Diagnosis not present

## 2023-08-24 DIAGNOSIS — N390 Urinary tract infection, site not specified: Secondary | ICD-10-CM | POA: Diagnosis not present

## 2023-08-24 DIAGNOSIS — N186 End stage renal disease: Secondary | ICD-10-CM | POA: Diagnosis not present

## 2023-08-24 LAB — URINE CULTURE: Culture: 80000 — AB

## 2023-08-24 LAB — CBC
HCT: 25.1 % — ABNORMAL LOW (ref 39.0–52.0)
Hemoglobin: 7.8 g/dL — ABNORMAL LOW (ref 13.0–17.0)
MCH: 28.5 pg (ref 26.0–34.0)
MCHC: 31.1 g/dL (ref 30.0–36.0)
MCV: 91.6 fL (ref 80.0–100.0)
Platelets: 136 10*3/uL — ABNORMAL LOW (ref 150–400)
RBC: 2.74 MIL/uL — ABNORMAL LOW (ref 4.22–5.81)
RDW: 12.7 % (ref 11.5–15.5)
WBC: 15.9 10*3/uL — ABNORMAL HIGH (ref 4.0–10.5)
nRBC: 0 % (ref 0.0–0.2)

## 2023-08-24 LAB — GLUCOSE, CAPILLARY
Glucose-Capillary: 126 mg/dL — ABNORMAL HIGH (ref 70–99)
Glucose-Capillary: 181 mg/dL — ABNORMAL HIGH (ref 70–99)
Glucose-Capillary: 168 mg/dL — ABNORMAL HIGH (ref 70–99)
Glucose-Capillary: 230 mg/dL — ABNORMAL HIGH (ref 70–99)

## 2023-08-24 MED ORDER — DORZOLAMIDE HCL-TIMOLOL MAL 2-0.5 % OP SOLN
1.0000 [drp] | Freq: Two times a day (BID) | OPHTHALMIC | Status: DC
  Administered 2023-08-24 – 2023-08-25 (×2): 1 [drp] via OPHTHALMIC
  Filled 2023-08-24: qty 10

## 2023-08-24 MED ORDER — LATANOPROST 0.005 % OP SOLN
1.0000 [drp] | Freq: Every day | OPHTHALMIC | Status: DC
  Administered 2023-08-24: 1 [drp] via OPHTHALMIC
  Filled 2023-08-24: qty 2.5

## 2023-08-24 MED ORDER — DORZOLAMIDE HCL-TIMOLOL MAL PF 2-0.5 % OP SOLN: 1.0000 [drp] | Freq: Two times a day (BID) | OPHTHALMIC | Status: DC

## 2023-08-24 MED ORDER — ALPRAZOLAM 0.25 MG PO TABS
0.2500 mg | ORAL_TABLET | Freq: Two times a day (BID) | ORAL | Status: DC | PRN
  Administered 2023-08-24 (×2): 0.25 mg via ORAL
  Filled 2023-08-24 (×2): qty 1

## 2023-08-24 NOTE — Progress Notes (Signed)
PROGRESS NOTE  Philip Benjamin OZH:[id number] DOB: 06-Sep-1951 DOA: 08/21/2023 PCP: Kirstie Peri, MD  Brief History:  72 year old male with a history of hypertension, diabetes mellitus, hyperlipidemia newly diagnosed ESRD from his last hospitalization presenting from [facility] rehab center secondary to fever with nausea and dry heaving.  The patient was recently hospitalized from 08/14/2023-08/20/2023 where he was declared new ESRD and initiated on dialysis.  TDC was placed on 08/15/2023, and the patient was initiated on HD.  On 08/21/2023, the patient began having symptoms of urinary frequency and urgency.  He has subjective fevers and chills.  He had some nausea and began subsequently having some dry heaving.  As result, he was sent to the emergency department for further evaluation and treatment.  The patient denied any headache, chest pain, shortness breath, cough, hemoptysis.  He denied any abdominal pain, frank dysuria, hematochezia, melena. In the ED, the patient had a fever of 101.4 F.  He was tachycardic into the 120s.  He was hemodynamically stable with oxygen saturation 96-98% on room air.  WBC 20.0, hemoglobin 9.3, platelets 135.  Sodium 132, potassium 3.6, bicarbonate 23, serum creatinine 3.4.  CT of the chest, abdomen, pelvis showed mild lingular/posterior right basilar atelectasis, mild posterior medial LLL infiltrate/atelectasis.  There was mild diffuse urinary bladder wall thickening.  There is a nonobstructive left renal calculus.  There is no bowel wall thickening or obstruction.  Blood cultures were obtained.  UA showed 21-50 WBC.  The patient was started on vancomycin and cefepime.   Assessment/Plan: Severe sepsis -Presented with fever, tachycardia, leukocytosis -Secondary to urinary source and possible pneumonia -PCT 14.27 -UA 21-50 WBC -initially started vancomycin and cefepime pending culture data -Follow blood culture--neg to date -08/21/2023 CT CAP--as discussed  above in the history -Lactic acid 2.0>> 1.4   UTI--pseudomonas -d/c vanco -continue cefepime while inpatient   Lobar pneumonia -08/21/2023 CT chest as discussed above -Add azithromycin -MRSA screen--neg -COVID-19 PCR negative -initially on empiric vancomycin and cefepime pending culture data -d/c vanco   ESRD -Nephrology consulted for maintenance dialysis -HD on 11/1  Situational Anxiety -start prn xanax   Thrombocytopenia -Secondary to sepsis -Anticipate improvement with treatment of sepsis   Transaminasemia -Secondary to sepsis and hemodynamic changes -No abdominal pain presently -08/21/2023 CT abdomen negative for gallbladder wall thickening or liver abnormalities -Trend LFTs>>improved   BPH -Continue tamsulosin   Diabetes mellitus type 2 -08/14/2023 hemoglobin A1c 5.6 -Continue NovoLog sliding scale -Continue reduced dose Semglee   Essential hypertension -Restart metoprolol -His losartan and diltiazem were discontinued during his last hospital admission secondary to soft BPs   Hyperlipidemia -Continue statin   Anemia of CKD:  - Hgb stable in 8 range, no bleeding. On ESA per nephrology.   Hypomagnesemia -repleted   Diarrhea -cdiff neg       Family Communication: daughter updated 11/3   Consultants:  renal   Code Status:  FULL    DVT Prophylaxis:  [place] Heparin      Procedures: As Listed in Progress Note Above   Antibiotics: Vanc 10/31>>11/1 Cefepime 10/31>>           Subjective: Pt complains of urine urgency, but it is slowly improving.  Denies f/c, cp, sob, n/vd  Objective: Vitals:   08/23/23 0935 08/23/23 1257 08/24/23 0800 08/24/23 1324  BP: (!) 113/59 107/63 132/87 101/67  Pulse: (!) 103 87 (!) 106 88  Resp: 18 18 18 18   Temp:  98.3 F (36.8  C) 98 F (36.7 C) 98.6 F (37 C)  TempSrc:   Oral Oral  SpO2: 98% 100% 100% 100%  Weight:      Height:        Intake/Output Summary (Last 24 hours) at 08/24/2023 1751 Last  data filed at 08/24/2023 1500 Gross per 24 hour  Intake 600 ml  Output 800 ml  Net -200 ml   Weight change:  Exam:  General:  Pt is alert, follows commands appropriately, not in acute distress HEENT: No icterus, No thrush, No neck mass, Veteran/AT Cardiovascular: RRR, S1/S2, no rubs, no gallops Respiratory: CTA bilaterally, no wheezing, no crackles, no rhonchi Abdomen: Soft/+BS, non tender, non distended, no guarding Extremities: No edema, No lymphangitis, No petechiae, No rashes, no synovitis   Data Reviewed: I have personally reviewed following labs and imaging studies Basic Metabolic Panel: Recent Labs  Lab 08/18/23 0459 08/19/23 0428 08/20/23 0421 08/21/23 1826 08/22/23 0422 08/22/23 1401 08/23/23 0425  NA 131* 131* 131* 134* 132*  --  133*  K 3.8 3.7 3.6 3.8 3.6  --  3.5  CL 95* 95* 94* 95* 97*  --  97*  CO2 22 27 26 23 23   --  26  GLUCOSE 119* 103* 93 163* 169*  --  130*  BUN 55* 39* 51* 38* 41*  --  30*  CREATININE 4.96* 3.73* 4.46* 3.71* 3.84*  --  2.96*  CALCIUM 8.9 8.7* 9.1 9.2 8.8*  --  8.6*  MG  --   --   --   --  1.6*  --  1.6*  PHOS 4.7* 3.3 3.8  --  2.7 1.8*  --    Liver Function Tests: Recent Labs  Lab 08/19/23 0428 08/20/23 0421 08/21/23 1826 08/22/23 0422 08/23/23 0425  AST  --   --  47* 36 28  ALT  --   --  45* 35 30  ALKPHOS  --   --  118 102 95  BILITOT  --   --  0.8 1.0 1.1  PROT  --   --  7.2 6.3* 6.0*  ALBUMIN 3.2* 3.4* 3.8 3.2* 2.9*   No results for input(s): "LIPASE", "AMYLASE" in the last 168 hours. No results for input(s): "AMMONIA" in the last 168 hours. Coagulation Profile: No results for input(s): "INR", "PROTIME" in the last 168 hours. CBC: Recent Labs  Lab 08/18/23 1513 08/21/23 2012 08/22/23 0422 08/23/23 0425 08/24/23 0450  WBC 7.9 20.0* 28.8* 23.3* 15.9*  NEUTROABS  --  17.0*  --   --   --   HGB 8.7* 9.3* 9.0* 8.7* 7.8*  HCT 29.4* 30.1* 28.5* 26.9* 25.1*  MCV 100.7* 92.3 91.9 91.5 91.6  PLT 150 135* 142* 131* 136*    Cardiac Enzymes: No results for input(s): "CKTOTAL", "CKMB", "CKMBINDEX", "TROPONINI" in the last 168 hours. BNP: Invalid input(s): "POCBNP" CBG: Recent Labs  Lab 08/23/23 1554 08/23/23 2214 08/24/23 0832 08/24/23 1124 08/24/23 1521  GLUCAP 223* 194* 126* 181* 168*   HbA1C: No results for input(s): "HGBA1C" in the last 72 hours. Urine analysis:    Component Value Date/Time   COLORURINE YELLOW 08/21/2023 2033   APPEARANCEUR CLEAR 08/21/2023 2033   LABSPEC 1.010 08/21/2023 2033   PHURINE 7.0 08/21/2023 2033   GLUCOSEU NEGATIVE 08/21/2023 2033   HGBUR SMALL (A) 08/21/2023 2033   BILIRUBINUR NEGATIVE 08/21/2023 2033   KETONESUR NEGATIVE 08/21/2023 2033   PROTEINUR 100 (A) 08/21/2023 2033   NITRITE NEGATIVE 08/21/2023 2033   LEUKOCYTESUR LARGE (A) 08/21/2023 2033  Sepsis Labs: @LABRCNTIP (procalcitonin:4,lacticidven:4) ) Recent Results (from the past 240 hour(s))  Resp panel by RT-PCR (RSV, Flu A&B, Covid) Anterior Nasal Swab     Status: None   Collection Time: 08/21/23  4:18 PM   Specimen: Anterior Nasal Swab  Result Value Ref Range Status   SARS Coronavirus 2 by RT PCR NEGATIVE NEGATIVE Final    Comment: (NOTE) SARS-CoV-2 target nucleic acids are NOT DETECTED.  The SARS-CoV-2 RNA is generally detectable in upper respiratory specimens during the acute phase of infection. The lowest concentration of SARS-CoV-2 viral copies this assay can detect is 138 copies/mL. A negative result does not preclude SARS-Cov-2 infection and should not be used as the sole basis for treatment or other patient management decisions. A negative result may occur with  improper specimen collection/handling, submission of specimen other than nasopharyngeal swab, presence of viral mutation(s) within the areas targeted by this assay, and inadequate number of viral copies(<138 copies/mL). A negative result must be combined with clinical observations, patient history, and  epidemiological information. The expected result is Negative.  Fact Sheet for Patients:  BloggerCourse.com  Fact Sheet for Healthcare Providers:  SeriousBroker.it  This test is no t yet approved or cleared by the [place] FDA and  has been authorized for detection and/or diagnosis of SARS-CoV-2 by FDA under an Emergency Use Authorization (EUA). This EUA will remain  in effect (meaning this test can be used) for the duration of the COVID-19 declaration under Section 564(b)(1) of the Act, 21 U.S.C.section 360bbb-3(b)(1), unless the authorization is terminated  or revoked sooner.       Influenza A by PCR NEGATIVE NEGATIVE Final   Influenza B by PCR NEGATIVE NEGATIVE Final    Comment: (NOTE) The Xpert Xpress SARS-CoV-2/FLU/RSV plus assay is intended as an aid in the diagnosis of influenza from Nasopharyngeal swab specimens and should not be used as a sole basis for treatment. Nasal washings and aspirates are unacceptable for Xpert Xpress SARS-CoV-2/FLU/RSV testing.  Fact Sheet for Patients: BloggerCourse.com  Fact Sheet for Healthcare Providers: SeriousBroker.it  This test is not yet approved or cleared by the [place] FDA and has been authorized for detection and/or diagnosis of SARS-CoV-2 by FDA under an Emergency Use Authorization (EUA). This EUA will remain in effect (meaning this test can be used) for the duration of the COVID-19 declaration under Section 564(b)(1) of the Act, 21 U.S.C. section 360bbb-3(b)(1), unless the authorization is terminated or revoked.     Resp Syncytial Virus by PCR NEGATIVE NEGATIVE Final    Comment: (NOTE) Fact Sheet for Patients: BloggerCourse.com  Fact Sheet for Healthcare Providers: SeriousBroker.it  This test is not yet approved or cleared by the [place] FDA and has been  authorized for detection and/or diagnosis of SARS-CoV-2 by FDA under an Emergency Use Authorization (EUA). This EUA will remain in effect (meaning this test can be used) for the duration of the COVID-19 declaration under Section 564(b)(1) of the Act, 21 U.S.C. section 360bbb-3(b)(1), unless the authorization is terminated or revoked.  Performed at [facility], [address], [place], [place] [address]   Blood culture (routine x 2)     Status: None (Preliminary result)   Collection Time: 08/21/23  6:26 PM   Specimen: BLOOD RIGHT FOREARM  Result Value Ref Range Status   Specimen Description BLOOD RIGHT FOREARM  Final   Special Requests   Final    BOTTLES DRAWN AEROBIC ONLY Blood Culture adequate volume   Culture   Final  NO GROWTH 3 DAYS Performed at [facility], [address], [place], [place] [address]    Report Status PENDING  Incomplete  Blood culture (routine x 2)     Status: None (Preliminary result)   Collection Time: 08/21/23  6:35 PM   Specimen: BLOOD  Result Value Ref Range Status   Specimen Description BLOOD DIAL CHEST PORT  Final   Special Requests   Final    HEMODIALYSIS LINE BOTTLES DRAWN AEROBIC AND ANAEROBIC Blood Culture adequate volume   Culture   Final    NO GROWTH 3 DAYS Performed at [facility], [address] [place], [place] [address]    Report Status PENDING  Incomplete  Urine Culture     Status: Abnormal   Collection Time: 08/21/23  8:33 PM   Specimen: Urine, Catheterized  Result Value Ref Range Status   Specimen Description   Final    URINE, CATHETERIZED Performed at [facility], [address], [place], [place] [address]    Special Requests   Final    NONE Performed at [facility], [address] [place], [place] [address]    Culture 80,000 COLONIES/mL PSEUDOMONAS AERUGINOSA (A)  Final   Report Status 08/24/2023 FINAL  Final   Organism ID, Bacteria PSEUDOMONAS AERUGINOSA (A)  Final      Susceptibility   Pseudomonas aeruginosa - MIC*     CEFTAZIDIME 4 SENSITIVE Sensitive     CIPROFLOXACIN 0.5 SENSITIVE Sensitive     GENTAMICIN <=1 SENSITIVE Sensitive     IMIPENEM 2 SENSITIVE Sensitive     PIP/TAZO 16 SENSITIVE Sensitive ug/mL    CEFEPIME 2 SENSITIVE Sensitive     * 80,000 COLONIES/mL PSEUDOMONAS AERUGINOSA  MRSA Next Gen by PCR, Nasal     Status: None   Collection Time: 08/22/23  4:30 PM   Specimen: Nasal Mucosa; Nasal Swab  Result Value Ref Range Status   MRSA by PCR Next Gen NOT DETECTED NOT DETECTED Final    Comment: (NOTE) The GeneXpert MRSA Assay (FDA approved for NASAL specimens only), is one component of a comprehensive MRSA colonization surveillance program. It is not intended to diagnose MRSA infection nor to guide or monitor treatment for MRSA infections. Test performance is not FDA approved in patients less than 30 years old. Performed at [facility], [address], [place], [place] [address]   C Difficile Quick Screen w PCR reflex     Status: None   Collection Time: 08/23/23  5:25 AM   Specimen: STOOL  Result Value Ref Range Status   C Diff antigen NEGATIVE NEGATIVE Final   C Diff toxin NEGATIVE NEGATIVE Final   C Diff interpretation No C. difficile detected.  Final    Comment: Performed at [facility], [address], [place], [place] [address]     Scheduled Meds:  azithromycin  500 mg Oral Daily   Chlorhexidine Gluconate Cloth  6 each Topical Q0600   cycloSPORINE  1 drop Both Eyes BID   dextromethorphan-guaiFENesin  1 tablet Oral BID   dorzolamide-timolol  1 drop Both Eyes BID   folic acid  1,000 mcg Oral Daily   heparin  5,000 Units Subcutaneous Q8H   insulin aspart  0-5 Units Subcutaneous QHS   insulin aspart  0-6 Units Subcutaneous TID WC   insulin glargine-yfgn  10 Units Subcutaneous QHS   latanoprost  1 drop Both Eyes QHS   metoprolol tartrate  12.5 mg Oral BID   tamsulosin  0.4 mg Oral Q supper  Continuous Infusions:  ceFEPime (MAXIPIME) IV 1 g (08/24/23 1630)     Procedures/Studies: CT CHEST ABDOMEN PELVIS WO CONTRAST  Result Date: 08/21/2023 CLINICAL DATA:  Sepsis. EXAM: CT CHEST, ABDOMEN AND PELVIS WITHOUT CONTRAST TECHNIQUE: Multidetector CT imaging of the chest, abdomen and pelvis was performed following the standard protocol without IV contrast. RADIATION DOSE REDUCTION: This exam was performed according to the departmental dose-optimization program which includes automated exposure control, adjustment of the mA and/or kV according to patient size and/or use of iterative reconstruction technique. COMPARISON:  None Available. FINDINGS: CT CHEST FINDINGS Cardiovascular: A right-sided venous catheter is in place. There is mild calcification of the aortic arch, without evidence of aortic aneurysm. Normal heart size with moderate severity coronary artery calcification. No pericardial effusion. Mediastinum/Nodes: No enlarged mediastinal, hilar, or axillary lymph nodes. Thyroid gland, trachea, and esophagus demonstrate no significant findings. Lungs/Pleura: Mild lingular and posterior right basilar linear scarring and/or atelectasis is seen. Mild posteromedial left lower lobe atelectasis and/or infiltrate is noted. There is mild elevation of the left hemidiaphragm. No pleural effusion or pneumothorax is identified. Musculoskeletal: Multilevel degenerative changes are seen throughout the thoracic spine CT ABDOMEN PELVIS FINDINGS Hepatobiliary: No focal liver abnormality is seen. The gallbladder is moderately distended without evidence of gallstones, gallbladder wall thickening, or biliary dilatation. Pancreas: Unremarkable. No pancreatic ductal dilatation or surrounding inflammatory changes. Spleen: Normal in size without focal abnormality. Adrenals/Urinary Tract: Adrenal glands are unremarkable. Kidneys are normal, without obstructing renal calculi, focal lesion, or hydronephrosis. A 1 mm nonobstructing renal calculus is seen within the mid to upper left kidney.  Mild, diffuse urinary bladder wall thickening is noted. Stomach/Bowel: Stomach is within normal limits. Appendix appears normal. No evidence of bowel wall thickening, distention, or inflammatory changes. Noninflamed diverticula are seen throughout the large bowel Vascular/Lymphatic: Aortic atherosclerosis. No enlarged abdominal or pelvic lymph nodes. Reproductive: The prostate gland is mildly enlarged. Other: No abdominal wall hernia or abnormality. No abdominopelvic ascites. Musculoskeletal: Multilevel degenerative changes are seen throughout the lumbar spine. IMPRESSION: 1. Mild posteromedial left lower lobe atelectasis and/or infiltrate. 2. Mild lingular and posterior right basilar linear scarring and/or atelectasis. 3. Colonic diverticulosis. 4. 1 mm nonobstructing left renal calculus. 5. Aortic atherosclerosis. Aortic Atherosclerosis (ICD10-I70.0). Electronically Signed   By: Aram Candela M.D.   On: 08/21/2023 22:41   IR Fluoro Guide CV Line Right  Result Date: 08/15/2023 INDICATION: Renal failure EXAM: Ultrasound and fluoroscopy guided tunneled hemodialysis catheter placement MEDICATIONS: Ancef 2 g IV; The antibiotic was administered within an appropriate time interval prior to skin puncture. ANESTHESIA/SEDATION: Moderate (conscious) sedation was employed during this procedure. A total of Versed 0.5 mg and Fentanyl 25 mcg was administered intravenously by the radiology nurse. Total intra-service moderate Sedation Time: 10 minutes. The patient's level of consciousness and vital signs were monitored continuously by radiology nursing throughout the procedure under my direct supervision. FLUOROSCOPY: Radiation Exposure Index (as provided by the fluoroscopic device): 3 mGy Kerma COMPLICATIONS: None immediate. PROCEDURE: Informed written consent was obtained from the patient after a thorough discussion of the procedural risks, benefits and alternatives. All questions were addressed. Maximal Sterile Barrier  Technique was utilized including caps, mask, sterile gowns, sterile gloves, sterile drape, hand hygiene and skin antiseptic. A timeout was performed prior to the initiation of the procedure. The right internal jugular vein was evaluated with ultrasound and shown to be patent. A permanent ultrasound image was obtained and placed in the patient's medical record. Using sterile gel and a sterile probe cover, the  right internal jugular vein was entered with a 21 ga needle during real time ultrasound guidance. 0.018 inch guidewire placed and 21 ga needle exchanged for transitional dilator set. Utilizing fluoroscopy, 0.035 inch guidewire advanced through the dilator without difficulty. Seriel dilation was performed and peel-away sheath was placed. Attention then turned to the right anterior upper chest. Following local lidocaine administration, the hemodialysis catheter was tunneled from the chest wall to the venotomy site. The catheter was inserted through the peel-away sheath. The tip of the catheter was positioned within the right atrium using fluoroscopic guidance. All lumens of the catheter aspirated and flushed well. The dialysis lumens were locked with Heparin. The catheter was secured to the skin with suture. The insertion site was covered with sterile dressing. IMPRESSION: 23 cm tunneled right IJ hemodialysis catheter is ready for use. Electronically Signed   By: Acquanetta Belling M.D.   On: 08/15/2023 15:59   IR US Guide Vasc Access Right  Result Date: 08/15/2023 INDICATION: Renal failure EXAM: Ultrasound and fluoroscopy guided tunneled hemodialysis catheter placement MEDICATIONS: Ancef 2 g IV; The antibiotic was administered within an appropriate time interval prior to skin puncture. ANESTHESIA/SEDATION: Moderate (conscious) sedation was employed during this procedure. A total of Versed 0.5 mg and Fentanyl 25 mcg was administered intravenously by the radiology nurse. Total intra-service moderate Sedation Time:  10 minutes. The patient's level of consciousness and vital signs were monitored continuously by radiology nursing throughout the procedure under my direct supervision. FLUOROSCOPY: Radiation Exposure Index (as provided by the fluoroscopic device): 3 mGy Kerma COMPLICATIONS: None immediate. PROCEDURE: Informed written consent was obtained from the patient after a thorough discussion of the procedural risks, benefits and alternatives. All questions were addressed. Maximal Sterile Barrier Technique was utilized including caps, mask, sterile gowns, sterile gloves, sterile drape, hand hygiene and skin antiseptic. A timeout was performed prior to the initiation of the procedure. The right internal jugular vein was evaluated with ultrasound and shown to be patent. A permanent ultrasound image was obtained and placed in the patient's medical record. Using sterile gel and a sterile probe cover, the right internal jugular vein was entered with a 21 ga needle during real time ultrasound guidance. 0.018 inch guidewire placed and 21 ga needle exchanged for transitional dilator set. Utilizing fluoroscopy, 0.035 inch guidewire advanced through the dilator without difficulty. Seriel dilation was performed and peel-away sheath was placed. Attention then turned to the right anterior upper chest. Following local lidocaine administration, the hemodialysis catheter was tunneled from the chest wall to the venotomy site. The catheter was inserted through the peel-away sheath. The tip of the catheter was positioned within the right atrium using fluoroscopic guidance. All lumens of the catheter aspirated and flushed well. The dialysis lumens were locked with Heparin. The catheter was secured to the skin with suture. The insertion site was covered with sterile dressing. IMPRESSION: 23 cm tunneled right IJ hemodialysis catheter is ready for use. Electronically Signed   By: Acquanetta Belling M.D.   On: 08/15/2023 15:59   DG Chest Port 1  View  Result Date: 08/14/2023 CLINICAL DATA:  Weakness.  Lower extremity edema. EXAM: PORTABLE CHEST 1 VIEW COMPARISON:  05/20/2023 FINDINGS: Stable cardiomediastinal contours. Low lung volumes. Eventration of the hemidiaphragms. Unchanged scarring and volume loss identified within the left base. No airspace consolidation, pleural effusions, or interstitial edema. No pneumothorax. IMPRESSION: Low lung volumes and left base scarring. No acute findings. Electronically Signed   By: Signa Kell M.D.   On: 08/14/2023 05:29  Catarina Hartshorn, DO  [facility]  If 7PM-7AM, please contact night-coverage [email or web address] Password [email or web address] 08/24/2023, 5:51 PM   LOS: 3 days

## 2023-08-24 NOTE — Progress Notes (Signed)
Gave zofran for nausea but has not vomited and received xanax for anxiety.  Has not had bm today.

## 2023-08-25 DIAGNOSIS — N3001 Acute cystitis with hematuria: Secondary | ICD-10-CM | POA: Diagnosis not present

## 2023-08-25 DIAGNOSIS — N186 End stage renal disease: Secondary | ICD-10-CM | POA: Diagnosis not present

## 2023-08-25 DIAGNOSIS — A419 Sepsis, unspecified organism: Secondary | ICD-10-CM | POA: Diagnosis not present

## 2023-08-25 DIAGNOSIS — A4152 Sepsis due to Pseudomonas: Secondary | ICD-10-CM | POA: Diagnosis not present

## 2023-08-25 LAB — RENAL FUNCTION PANEL
Albumin: 2.8 g/dL — ABNORMAL LOW (ref 3.5–5.0)
Anion gap: 9 (ref 5–15)
BUN: 51 mg/dL — ABNORMAL HIGH (ref 8–23)
CO2: 26 mmol/L (ref 22–32)
Calcium: 8.6 mg/dL — ABNORMAL LOW (ref 8.9–10.3)
Chloride: 96 mmol/L — ABNORMAL LOW (ref 98–111)
Creatinine, Ser: 4.46 mg/dL — ABNORMAL HIGH (ref 0.61–1.24)
GFR, Estimated: 13 mL/min — ABNORMAL LOW (ref >60)
Glucose, Bld: 104 mg/dL — ABNORMAL HIGH (ref 70–99)
Phosphorus: 3.5 mg/dL (ref 2.5–4.6)
Potassium: 3.4 mmol/L — ABNORMAL LOW (ref 3.5–5.1)
Sodium: 131 mmol/L — ABNORMAL LOW (ref 135–145)

## 2023-08-25 LAB — CBC
HCT: 24.5 % — ABNORMAL LOW (ref 39.0–52.0)
Hemoglobin: 7.8 g/dL — ABNORMAL LOW (ref 13.0–17.0)
MCH: 29.1 pg (ref 26.0–34.0)
MCHC: 31.8 g/dL (ref 30.0–36.0)
MCV: 91.4 fL (ref 80.0–100.0)
Platelets: 141 10*3/uL — ABNORMAL LOW (ref 150–400)
RBC: 2.68 MIL/uL — ABNORMAL LOW (ref 4.22–5.81)
RDW: 12.5 % (ref 11.5–15.5)
WBC: 9.7 10*3/uL (ref 4.0–10.5)
nRBC: 0 % (ref 0.0–0.2)

## 2023-08-25 LAB — GLUCOSE, CAPILLARY: Glucose-Capillary: 103 mg/dL — ABNORMAL HIGH (ref 70–99)

## 2023-08-25 MED ORDER — CIPROFLOXACIN HCL 250 MG PO TABS: 500.0000 mg | ORAL_TABLET | Freq: Every day | ORAL | Status: DC

## 2023-08-25 MED ORDER — LATANOPROST 0.005 % OP SOLN: 1.0000 [drp] | Freq: Every day | OPHTHALMIC | Status: DC

## 2023-08-25 MED ORDER — AZITHROMYCIN 500 MG PO TABS: 500.0000 mg | ORAL_TABLET | Freq: Once | ORAL | Status: DC

## 2023-08-25 MED ORDER — CIPROFLOXACIN HCL 500 MG PO TABS: 500.0000 mg | ORAL_TABLET | Freq: Every day | ORAL | Status: AC

## 2023-08-25 NOTE — Progress Notes (Signed)
Pt tolerated tx well and goal met.  08/25/23 1326  Vitals  Temp 98 F (36.7 C)  Temp Source Oral  BP 130/74  BP Location Left Arm  BP Method Automatic  Patient Position (if appropriate) Lying  Pulse Rate 80  Resp 16  Oxygen Therapy  SpO2 98 %  O2 Device Room Air  During Treatment Monitoring  Intra-Hemodialysis Comments Tx completed  Post Treatment  Dialyzer Clearance Lightly streaked  Hemodialysis Intake (mL) 0 mL  Liters Processed 68  Fluid Removed (mL) 2000 mL  Tolerated HD Treatment Yes  Post-Hemodialysis Comments Pt goal met.  Hemodialysis Catheter Right Subclavian Double lumen Permanent (Tunneled)  Placement Date/Time: 08/15/23 0820   Serial / Lot #: [id number]  Expiration Date: 04/27/28  Time Out: Correct patient;Correct site;Correct procedure  Maximum sterile barrier precautions: Hand hygiene;Cap;Mask;Sterile gown;Sterile gloves;Large sterile ...  Site Condition No complications  Blue Lumen Status Heparin locked  Red Lumen Status Heparin locked  Catheter fill solution Heparin 1000 units/ml  Catheter fill volume (Arterial) 1.9 cc  Catheter fill volume (Venous) 1.9  Dressing Type Transparent  Dressing Status Antimicrobial disc in place;Clean, Dry, Intact  Interventions New dressing  Drainage Description None  Dressing Change Due 08/27/23  Post treatment catheter status Capped and Clamped

## 2023-08-25 NOTE — Discharge Summary (Addendum)
Physician Discharge Summary   Patient: Philip Benjamin MRN: [id number] DOB: 07-Jan-1951  Admit date:     08/21/2023  Discharge date: 08/25/23  Discharge Physician: Onalee Hua Lennard Capek   PCP: Kirstie Peri, MD   Recommendations at discharge:   Please follow up with primary care provider within 1-2 weeks  Please repeat BMP and CBC in one week     Hospital Course: 72 year old male with a history of hypertension, diabetes mellitus, hyperlipidemia newly diagnosed ESRD from his last hospitalization presenting from [facility] rehab center secondary to fever with nausea and dry heaving.  The patient was recently hospitalized from 08/14/2023-08/20/2023 where he was declared new ESRD and initiated on dialysis.  TDC was placed on 08/15/2023, and the patient was initiated on HD.  On 08/21/2023, the patient began having symptoms of urinary frequency and urgency.  He has subjective fevers and chills.  He had some nausea and began subsequently having some dry heaving.  As result, he was sent to the emergency department for further evaluation and treatment.  The patient denied any headache, chest pain, shortness breath, cough, hemoptysis.  He denied any abdominal pain, frank dysuria, hematochezia, melena. In the ED, the patient had a fever of 101.4 F.  He was tachycardic into the 120s.  He was hemodynamically stable with oxygen saturation 96-98% on room air.  WBC 20.0, hemoglobin 9.3, platelets 135.  Sodium 132, potassium 3.6, bicarbonate 23, serum creatinine 3.4.  CT of the chest, abdomen, pelvis showed mild lingular/posterior right basilar atelectasis, mild posterior medial LLL infiltrate/atelectasis.  There was mild diffuse urinary bladder wall thickening.  There is a nonobstructive left renal calculus.  There is no bowel wall thickening or obstruction.  Blood cultures were obtained.  UA showed 21-50 WBC.  The patient was started on vancomycin and cefepime.  Assessment and Plan: Severe sepsis -Presented with fever,  tachycardia, leukocytosis -Secondary to urinary source and possible pneumonia -PCT 14.27 -UA 21-50 WBC -initially started vancomycin and cefepime pending culture data -Follow blood culture--neg to date -08/21/2023 CT CAP--as discussed above in the history -Lactic acid 2.0>> 1.4 -sepsis physiology resolved -WBC 29.8>>9.7   UTI--pseudomonas -d/c vanco -continue cefepime while inpatient -d/c with cipro 500 mg once daily on 11/5 and 11/6 to complete one week of antibiotics   Lobar pneumonia -08/21/2023 CT chest as discussed above -Add azithromycin>>received 4 days during hospitalization -MRSA screen--neg -COVID-19 PCR negative -initially on empiric vancomycin and cefepime pending culture data -d/c vanco -stable on RA   ESRD -Nephrology consulted for maintenance dialysis -last HD on 11/4   Situational Anxiety -start prn xanax>>will not continue as pt was a little somnolent afterward   Thrombocytopenia -Secondary to sepsis -Anticipate improvement with treatment of sepsis   Transaminasemia -Secondary to sepsis and hemodynamic changes -No abdominal pain presently -08/21/2023 CT abdomen negative for gallbladder wall thickening or liver abnormalities -Trend LFTs>>improved   BPH -Continue tamsulosin   Diabetes mellitus type 2 -08/14/2023 hemoglobin A1c 5.6 -Continue NovoLog sliding scale -Continue reduced dose Semglee during hospitalization   Essential hypertension -Restart metoprolol -His losartan and diltiazem were discontinued during his last hospital admission secondary to soft BPs   Hyperlipidemia -Continue statin   Anemia of CKD:  - Hgb stable in 8 range, no bleeding. On ESA per nephrology.   Hypomagnesemia -repleted   Diarrhea -cdiff neg -improved      Consultants: renal Procedures performed: none  Disposition: Skilled nursing facility Diet recommendation:  Renal diet DISCHARGE MEDICATION: Allergies as of 08/25/2023   No Known Allergies  Medication List     TAKE these medications    acetaminophen 650 MG CR tablet Commonly known as: TYLENOL Take 650 mg by mouth every 6 (six) hours as needed for pain.   BASAGLAR TEMPO PEN [place] Inject 30 Units into the skin at bedtime. Basaglar/Lantus   Cholecalciferol 125 MCG (5000 UT) capsule Take by mouth.   ciprofloxacin 500 MG tablet Commonly known as: CIPRO Take 1 tablet (500 mg total) by mouth daily for 2 days. Start taking on: August 26, 2023   cycloSPORINE 0.05 % ophthalmic emulsion Commonly known as: RESTASIS SMARTSIG:In Eye(s)   Dorzolamide HCl-Timolol Mal PF 2-0.5 % Soln Apply 1 drop to eye 2 (two) times daily.   folic acid 800 MCG tablet Commonly known as: FOLVITE Take 800 mcg by mouth daily.   latanoprost 0.005 % ophthalmic solution Commonly known as: XALATAN Place 1 drop into both eyes at bedtime.   metoprolol tartrate 25 MG tablet Commonly known as: LOPRESSOR Take 0.5 tablets (12.5 mg total) by mouth 2 (two) times daily.   multivitamin Tabs tablet Take 1 tablet by mouth at bedtime.   nystatin cream Commonly known as: MYCOSTATIN Apply 1 application topically 2 (two) times daily as needed. FOR YEAST OR SKIN RASHES.   tamsulosin 0.4 MG Caps capsule Commonly known as: FLOMAX Take 0.4 mg by mouth daily with supper.        Discharge Exam: Filed Weights   08/22/23 0900 08/22/23 1216 08/22/23 1533  Weight: 95.4 kg 95.4 kg 94.4 kg   HEENT:  [place]/AT, No thrush, no icterus CV:  RRR, no rub, no S3, no S4 Lung:  CTA, no wheeze, no rhonchi Abd:  soft/+BS, NT Ext:  No edema, no lymphangitis, no synovitis, no rash   Condition at discharge: stable  The results of significant diagnostics from this hospitalization (including imaging, microbiology, ancillary and laboratory) are listed below for reference.   Imaging Studies: CT CHEST ABDOMEN PELVIS WO CONTRAST  Result Date: 08/21/2023 CLINICAL DATA:  Sepsis. EXAM: CT CHEST, ABDOMEN AND PELVIS WITHOUT  CONTRAST TECHNIQUE: Multidetector CT imaging of the chest, abdomen and pelvis was performed following the standard protocol without IV contrast. RADIATION DOSE REDUCTION: This exam was performed according to the departmental dose-optimization program which includes automated exposure control, adjustment of the mA and/or kV according to patient size and/or use of iterative reconstruction technique. COMPARISON:  None Available. FINDINGS: CT CHEST FINDINGS Cardiovascular: A right-sided venous catheter is in place. There is mild calcification of the aortic arch, without evidence of aortic aneurysm. Normal heart size with moderate severity coronary artery calcification. No pericardial effusion. Mediastinum/Nodes: No enlarged mediastinal, hilar, or axillary lymph nodes. Thyroid gland, trachea, and esophagus demonstrate no significant findings. Lungs/Pleura: Mild lingular and posterior right basilar linear scarring and/or atelectasis is seen. Mild posteromedial left lower lobe atelectasis and/or infiltrate is noted. There is mild elevation of the left hemidiaphragm. No pleural effusion or pneumothorax is identified. Musculoskeletal: Multilevel degenerative changes are seen throughout the thoracic spine CT ABDOMEN PELVIS FINDINGS Hepatobiliary: No focal liver abnormality is seen. The gallbladder is moderately distended without evidence of gallstones, gallbladder wall thickening, or biliary dilatation. Pancreas: Unremarkable. No pancreatic ductal dilatation or surrounding inflammatory changes. Spleen: Normal in size without focal abnormality. Adrenals/Urinary Tract: Adrenal glands are unremarkable. Kidneys are normal, without obstructing renal calculi, focal lesion, or hydronephrosis. A 1 mm nonobstructing renal calculus is seen within the mid to upper left kidney. Mild, diffuse urinary bladder wall thickening is noted. Stomach/Bowel: Stomach is within normal limits.  Appendix appears normal. No evidence of bowel wall  thickening, distention, or inflammatory changes. Noninflamed diverticula are seen throughout the large bowel Vascular/Lymphatic: Aortic atherosclerosis. No enlarged abdominal or pelvic lymph nodes. Reproductive: The prostate gland is mildly enlarged. Other: No abdominal wall hernia or abnormality. No abdominopelvic ascites. Musculoskeletal: Multilevel degenerative changes are seen throughout the lumbar spine. IMPRESSION: 1. Mild posteromedial left lower lobe atelectasis and/or infiltrate. 2. Mild lingular and posterior right basilar linear scarring and/or atelectasis. 3. Colonic diverticulosis. 4. 1 mm nonobstructing left renal calculus. 5. Aortic atherosclerosis. Aortic Atherosclerosis (ICD10-I70.0). Electronically Signed   By: Aram Candela M.D.   On: 08/21/2023 22:41   IR Fluoro Guide CV Line Right  Result Date: 08/15/2023 INDICATION: Renal failure EXAM: Ultrasound and fluoroscopy guided tunneled hemodialysis catheter placement MEDICATIONS: Ancef 2 g IV; The antibiotic was administered within an appropriate time interval prior to skin puncture. ANESTHESIA/SEDATION: Moderate (conscious) sedation was employed during this procedure. A total of Versed 0.5 mg and Fentanyl 25 mcg was administered intravenously by the radiology nurse. Total intra-service moderate Sedation Time: 10 minutes. The patient's level of consciousness and vital signs were monitored continuously by radiology nursing throughout the procedure under my direct supervision. FLUOROSCOPY: Radiation Exposure Index (as provided by the fluoroscopic device): 3 mGy Kerma COMPLICATIONS: None immediate. PROCEDURE: Informed written consent was obtained from the patient after a thorough discussion of the procedural risks, benefits and alternatives. All questions were addressed. Maximal Sterile Barrier Technique was utilized including caps, mask, sterile gowns, sterile gloves, sterile drape, hand hygiene and skin antiseptic. A timeout was performed prior  to the initiation of the procedure. The right internal jugular vein was evaluated with ultrasound and shown to be patent. A permanent ultrasound image was obtained and placed in the patient's medical record. Using sterile gel and a sterile probe cover, the right internal jugular vein was entered with a 21 ga needle during real time ultrasound guidance. 0.018 inch guidewire placed and 21 ga needle exchanged for transitional dilator set. Utilizing fluoroscopy, 0.035 inch guidewire advanced through the dilator without difficulty. Seriel dilation was performed and peel-away sheath was placed. Attention then turned to the right anterior upper chest. Following local lidocaine administration, the hemodialysis catheter was tunneled from the chest wall to the venotomy site. The catheter was inserted through the peel-away sheath. The tip of the catheter was positioned within the right atrium using fluoroscopic guidance. All lumens of the catheter aspirated and flushed well. The dialysis lumens were locked with Heparin. The catheter was secured to the skin with suture. The insertion site was covered with sterile dressing. IMPRESSION: 23 cm tunneled right IJ hemodialysis catheter is ready for use. Electronically Signed   By: Acquanetta Belling M.D.   On: 08/15/2023 15:59   IR US Guide Vasc Access Right  Result Date: 08/15/2023 INDICATION: Renal failure EXAM: Ultrasound and fluoroscopy guided tunneled hemodialysis catheter placement MEDICATIONS: Ancef 2 g IV; The antibiotic was administered within an appropriate time interval prior to skin puncture. ANESTHESIA/SEDATION: Moderate (conscious) sedation was employed during this procedure. A total of Versed 0.5 mg and Fentanyl 25 mcg was administered intravenously by the radiology nurse. Total intra-service moderate Sedation Time: 10 minutes. The patient's level of consciousness and vital signs were monitored continuously by radiology nursing throughout the procedure under my direct  supervision. FLUOROSCOPY: Radiation Exposure Index (as provided by the fluoroscopic device): 3 mGy Kerma COMPLICATIONS: None immediate. PROCEDURE: Informed written consent was obtained from the patient after a thorough discussion of the procedural risks,  benefits and alternatives. All questions were addressed. Maximal Sterile Barrier Technique was utilized including caps, mask, sterile gowns, sterile gloves, sterile drape, hand hygiene and skin antiseptic. A timeout was performed prior to the initiation of the procedure. The right internal jugular vein was evaluated with ultrasound and shown to be patent. A permanent ultrasound image was obtained and placed in the patient's medical record. Using sterile gel and a sterile probe cover, the right internal jugular vein was entered with a 21 ga needle during real time ultrasound guidance. 0.018 inch guidewire placed and 21 ga needle exchanged for transitional dilator set. Utilizing fluoroscopy, 0.035 inch guidewire advanced through the dilator without difficulty. Seriel dilation was performed and peel-away sheath was placed. Attention then turned to the right anterior upper chest. Following local lidocaine administration, the hemodialysis catheter was tunneled from the chest wall to the venotomy site. The catheter was inserted through the peel-away sheath. The tip of the catheter was positioned within the right atrium using fluoroscopic guidance. All lumens of the catheter aspirated and flushed well. The dialysis lumens were locked with Heparin. The catheter was secured to the skin with suture. The insertion site was covered with sterile dressing. IMPRESSION: 23 cm tunneled right IJ hemodialysis catheter is ready for use. Electronically Signed   By: Acquanetta Belling M.D.   On: 08/15/2023 15:59   DG Chest Port 1 View  Result Date: 08/14/2023 CLINICAL DATA:  Weakness.  Lower extremity edema. EXAM: PORTABLE CHEST 1 VIEW COMPARISON:  05/20/2023 FINDINGS: Stable  cardiomediastinal contours. Low lung volumes. Eventration of the hemidiaphragms. Unchanged scarring and volume loss identified within the left base. No airspace consolidation, pleural effusions, or interstitial edema. No pneumothorax. IMPRESSION: Low lung volumes and left base scarring. No acute findings. Electronically Signed   By: Signa Kell M.D.   On: 08/14/2023 05:29    Microbiology: Results for orders placed or performed during the hospital encounter of 08/21/23  Resp panel by RT-PCR (RSV, Flu A&B, Covid) Anterior Nasal Swab     Status: None   Collection Time: 08/21/23  4:18 PM   Specimen: Anterior Nasal Swab  Result Value Ref Range Status   SARS Coronavirus 2 by RT PCR NEGATIVE NEGATIVE Final    Comment: (NOTE) SARS-CoV-2 target nucleic acids are NOT DETECTED.  The SARS-CoV-2 RNA is generally detectable in upper respiratory specimens during the acute phase of infection. The lowest concentration of SARS-CoV-2 viral copies this assay can detect is 138 copies/mL. A negative result does not preclude SARS-Cov-2 infection and should not be used as the sole basis for treatment or other patient management decisions. A negative result may occur with  improper specimen collection/handling, submission of specimen other than nasopharyngeal swab, presence of viral mutation(s) within the areas targeted by this assay, and inadequate number of viral copies(<138 copies/mL). A negative result must be combined with clinical observations, patient history, and epidemiological information. The expected result is Negative.  Fact Sheet for Patients:  BloggerCourse.com  Fact Sheet for Healthcare Providers:  SeriousBroker.it  This test is no t yet approved or cleared by the [place] FDA and  has been authorized for detection and/or diagnosis of SARS-CoV-2 by FDA under an Emergency Use Authorization (EUA). This EUA will remain  in effect (meaning  this test can be used) for the duration of the COVID-19 declaration under Section 564(b)(1) of the Act, 21 U.S.C.section 360bbb-3(b)(1), unless the authorization is terminated  or revoked sooner.       Influenza A by PCR NEGATIVE NEGATIVE  Final   Influenza B by PCR NEGATIVE NEGATIVE Final    Comment: (NOTE) The Xpert Xpress SARS-CoV-2/FLU/RSV plus assay is intended as an aid in the diagnosis of influenza from Nasopharyngeal swab specimens and should not be used as a sole basis for treatment. Nasal washings and aspirates are unacceptable for Xpert Xpress SARS-CoV-2/FLU/RSV testing.  Fact Sheet for Patients: BloggerCourse.com  Fact Sheet for Healthcare Providers: SeriousBroker.it  This test is not yet approved or cleared by the [place] FDA and has been authorized for detection and/or diagnosis of SARS-CoV-2 by FDA under an Emergency Use Authorization (EUA). This EUA will remain in effect (meaning this test can be used) for the duration of the COVID-19 declaration under Section 564(b)(1) of the Act, 21 U.S.C. section 360bbb-3(b)(1), unless the authorization is terminated or revoked.     Resp Syncytial Virus by PCR NEGATIVE NEGATIVE Final    Comment: (NOTE) Fact Sheet for Patients: BloggerCourse.com  Fact Sheet for Healthcare Providers: SeriousBroker.it  This test is not yet approved or cleared by the [place] FDA and has been authorized for detection and/or diagnosis of SARS-CoV-2 by FDA under an Emergency Use Authorization (EUA). This EUA will remain in effect (meaning this test can be used) for the duration of the COVID-19 declaration under Section 564(b)(1) of the Act, 21 U.S.C. section 360bbb-3(b)(1), unless the authorization is terminated or revoked.  Performed at [facility], [address] [place], [place] [address]   Blood culture (routine x 2)      Status: None (Preliminary result)   Collection Time: 08/21/23  6:26 PM   Specimen: BLOOD RIGHT FOREARM  Result Value Ref Range Status   Specimen Description BLOOD RIGHT FOREARM  Final   Special Requests   Final    BOTTLES DRAWN AEROBIC ONLY Blood Culture adequate volume   Culture   Final    NO GROWTH 4 DAYS Performed at [facility], [address] [place], [place] [address]    Report Status PENDING  Incomplete  Blood culture (routine x 2)     Status: None (Preliminary result)   Collection Time: 08/21/23  6:35 PM   Specimen: BLOOD  Result Value Ref Range Status   Specimen Description BLOOD DIAL CHEST PORT  Final   Special Requests   Final    HEMODIALYSIS LINE BOTTLES DRAWN AEROBIC AND ANAEROBIC Blood Culture adequate volume   Culture   Final    NO GROWTH 4 DAYS Performed at [facility], [address], [place], [place] [address]    Report Status PENDING  Incomplete  Urine Culture     Status: Abnormal   Collection Time: 08/21/23  8:33 PM   Specimen: Urine, Catheterized  Result Value Ref Range Status   Specimen Description   Final    URINE, CATHETERIZED Performed at [facility], [address], [place], [place] [address]    Special Requests   Final    NONE Performed at [facility], [address], [place], [place] [address]    Culture 80,000 COLONIES/mL PSEUDOMONAS AERUGINOSA (A)  Final   Report Status 08/24/2023 FINAL  Final   Organism ID, Bacteria PSEUDOMONAS AERUGINOSA (A)  Final      Susceptibility   Pseudomonas aeruginosa - MIC*    CEFTAZIDIME 4 SENSITIVE Sensitive     CIPROFLOXACIN 0.5 SENSITIVE Sensitive     GENTAMICIN <=1 SENSITIVE Sensitive     IMIPENEM 2 SENSITIVE Sensitive     PIP/TAZO 16 SENSITIVE Sensitive ug/mL    CEFEPIME 2 SENSITIVE Sensitive     *  80,000 COLONIES/mL PSEUDOMONAS AERUGINOSA  MRSA Next Gen by PCR, Nasal     Status: None   Collection Time: 08/22/23  4:30 PM   Specimen: Nasal Mucosa; Nasal Swab  Result Value Ref Range Status   MRSA by PCR  Next Gen NOT DETECTED NOT DETECTED Final    Comment: (NOTE) The GeneXpert MRSA Assay (FDA approved for NASAL specimens only), is one component of a comprehensive MRSA colonization surveillance program. It is not intended to diagnose MRSA infection nor to guide or monitor treatment for MRSA infections. Test performance is not FDA approved in patients less than 46 years old. Performed at [facility], [address] [place], [place] [address]   C Difficile Quick Screen w PCR reflex     Status: None   Collection Time: 08/23/23  5:25 AM   Specimen: STOOL  Result Value Ref Range Status   C Diff antigen NEGATIVE NEGATIVE Final   C Diff toxin NEGATIVE NEGATIVE Final   C Diff interpretation No C. difficile detected.  Final    Comment: Performed at [facility], [address], [place], [place] [address]    Labs: CBC: Recent Labs  Lab 08/18/23 1513 08/21/23 2012 08/22/23 0422 08/23/23 0425 08/24/23 0450  WBC 7.9 20.0* 28.8* 23.3* 15.9*  NEUTROABS  --  17.0*  --   --   --   HGB 8.7* 9.3* 9.0* 8.7* 7.8*  HCT 29.4* 30.1* 28.5* 26.9* 25.1*  MCV 100.7* 92.3 91.9 91.5 91.6  PLT 150 135* 142* 131* 136*   Basic Metabolic Panel: Recent Labs  Lab 08/19/23 0428 08/20/23 0421 08/21/23 1826 08/22/23 0422 08/22/23 1401 08/23/23 0425  NA 131* 131* 134* 132*  --  133*  K 3.7 3.6 3.8 3.6  --  3.5  CL 95* 94* 95* 97*  --  97*  CO2 27 26 23 23   --  26  GLUCOSE 103* 93 163* 169*  --  130*  BUN 39* 51* 38* 41*  --  30*  CREATININE 3.73* 4.46* 3.71* 3.84*  --  2.96*  CALCIUM 8.7* 9.1 9.2 8.8*  --  8.6*  MG  --   --   --  1.6*  --  1.6*  PHOS 3.3 3.8  --  2.7 1.8*  --    Liver Function Tests: Recent Labs  Lab 08/19/23 0428 08/20/23 0421 08/21/23 1826 08/22/23 0422 08/23/23 0425  AST  --   --  47* 36 28  ALT  --   --  45* 35 30  ALKPHOS  --   --  118 102 95  BILITOT  --   --  0.8 1.0 1.1  PROT  --   --  7.2 6.3* 6.0*  ALBUMIN 3.2* 3.4* 3.8 3.2* 2.9*   CBG: Recent Labs  Lab  08/24/23 0832 08/24/23 1124 08/24/23 1521 08/24/23 1941 08/25/23 0734  GLUCAP 126* 181* 168* 230* 103*    Discharge time spent: greater than 30 minutes.  Signed: Catarina Hartshorn, MD [facility] 08/25/2023

## 2023-08-25 NOTE — Care Management Important Message (Signed)
Important Message  Patient Details  Name: Philip Benjamin MRN: [id number] Date of Birth: [birth year]   Important Message Given:  Yes - Medicare IM     Corey Harold 08/25/2023, 11:58 AM

## 2023-08-25 NOTE — Progress Notes (Signed)
Returned from dialysis and ate lunch.  IV's removed and report called to [facility] nurse Deanna Artis   Transported to main entrance by [facility] to [facility] transport

## 2023-08-25 NOTE — TOC Transition Note (Signed)
Transition of Care [facility]) - CM/SW Discharge Note   Patient Details  Name: Philip Benjamin MRN: [id number] Date of Birth: [birth year]  Transition of Care [facility]) CM/SW Contact:  Villa Herb, LCSWA Phone Number: 08/25/2023, 11:35 AM  Clinical Narrative:    CSW updated that pt is medically stable for D/C today. CSW spoke to Destiny with [facility] who states facility is ready to accept pt today. CSW provided RN with room and report numbers. CSW updated pts daughter on plan for D/C today, she is agreeable and agreeable to ride being set up with [facility]. CSW to set up ride with [facility] once RN updates that pt is ready. TOC signing off.   Final next level of care: Skilled Nursing Facility Barriers to Discharge: Barriers Resolved   Patient Goals and CMS Choice CMS Medicare.gov Compare Post Acute Care list provided to:: Patient Represenative (must comment) Choice offered to / list presented to : Adult Children  Discharge Placement                  Patient to be transferred to facility by: [facility] Name of family member notified: daughter Patient and family notified of of transfer: 08/25/23  Discharge Plan and Services Additional resources added to the After Visit Summary for   In-house Referral: Clinical Social Work Discharge Planning Services: CM Consult Post Acute Care Choice: Skilled Nursing Facility                               Social Determinants of Health (SDOH) Interventions SDOH Screenings   Food Insecurity: No Food Insecurity (08/14/2023)  Housing: Low Risk  (08/14/2023)  Transportation Needs: No Transportation Needs (08/14/2023)  Utilities: Not At Risk (08/14/2023)  Tobacco Use: Low Risk  (08/21/2023)     Readmission Risk Interventions    08/22/2023   12:48 PM 08/15/2023   12:29 PM  Readmission Risk Prevention Plan  Transportation Screening Complete Complete  Home Care Screening Complete Complete  Medication Review (RN CM) Complete Complete

## 2023-08-25 NOTE — Consult Note (Signed)
[facility] Liaison Note  08/25/2023  AKBAR SACRA 22-Nov-1950 [id number]  Location: RN Hospital Liaison screened the patient remotely at [facility].  Insurance: Medicare   YANNI QUIROA is a 72 y.o. male who is a Primary Care Patient of Kirstie Peri, MD The patient was screened for 7 & 30 day readmission hospitalization with noted medium risk score for unplanned readmission risk with 2 IP in 6 months.  The patient was assessed for potential Care Management service needs for post hospital transition for care coordination. Review of patient's electronic medical record reveals patient was admitted with Sepsis due to UTI. Pt will discharged to SNF. Facility will continue to address pt's needs.   [facility] Care Management/Population Health does not replace or interfere with any arrangements made by the Inpatient Transition of Care team.   For questions contact:   Elliot Cousin, RN, [facility] Liaison [place]   [facility], Population Health Office Hours MTWF  8:00 am-6:00 pm Direct Dial: [phone] mobile [phone] [Office toll free line] Office Hours are M-F 8:30 - 5 pm [email or web address]

## 2023-08-25 NOTE — Progress Notes (Signed)
[facility] NEPHROLOGY PROGRESS NOTE  Assessment/ Plan: Pt is a 72 y.o. yo male    # ESRD: with dysgeusia/ anorexia, increased hypervolemia. TDC placed 10/25 and started HD that day. Placed at [facility]. Plan for dialysis today per MWF schedule.  # Sepsis: likely urinary source. Resolved,  Per TRH  #  Anemia:  Hgb 9. Resumed ESA. No iron   # CKD-MBD: Phos normal. Ca normal when accounting for albumin. PTH mildly elevated. No therapy needed  #Hyponatremia: secondary to free water retention in setting of ESRD. Dialysis as above   #  HTN/volume: Controlled on metop  # DM2: mgmt per primary  Subjective:  Feeling well today.  Tentative for discharge to SNF after dialysis  Objective Vital signs in last 24 hours: Vitals:   08/25/23 1058 08/25/23 1100 08/25/23 1110 08/25/23 1130  BP:  107/74  118/72  Pulse:  85  87  Resp:  18  14  Temp:      TempSrc:      SpO2:      Weight: 95 kg  95 kg   Height:       Weight change:   Intake/Output Summary (Last 24 hours) at 08/25/2023 1141 Last data filed at 08/25/2023 0900 Gross per 24 hour  Intake 840 ml  Output 1500 ml  Net -660 ml        Labs: RENAL PANEL Recent Labs  Lab 08/19/23 0428 08/20/23 0421 08/21/23 1826 08/22/23 0422 08/22/23 1401 08/23/23 0425 08/25/23 1040  NA 131* 131* 134* 132*  --  133* 131*  K 3.7 3.6 3.8 3.6  --  3.5 3.4*  CL 95* 94* 95* 97*  --  97* 96*  CO2 27 26 23 23   --  26 26  GLUCOSE 103* 93 163* 169*  --  130* 104*  BUN 39* 51* 38* 41*  --  30* 51*  CREATININE 3.73* 4.46* 3.71* 3.84*  --  2.96* 4.46*  CALCIUM 8.7* 9.1 9.2 8.8*  --  8.6* 8.6*  MG  --   --   --  1.6*  --  1.6*  --   PHOS 3.3 3.8  --  2.7 1.8*  --  3.5  ALBUMIN 3.2* 3.4* 3.8 3.2*  --  2.9* 2.8*    Liver Function Tests: Recent Labs  Lab 08/21/23 1826 08/22/23 0422 08/23/23 0425 08/25/23 1040  AST 47* 36 28  --   ALT 45* 35 30  --   ALKPHOS 118 102 95  --   BILITOT 0.8 1.0 1.1  --   PROT 7.2 6.3*  6.0*  --   ALBUMIN 3.8 3.2* 2.9* 2.8*   No results for input(s): "LIPASE", "AMYLASE" in the last 168 hours. No results for input(s): "AMMONIA" in the last 168 hours. CBC: Recent Labs    08/15/23 0424 08/15/23 1036 08/21/23 2012 08/22/23 0422 08/23/23 0425 08/24/23 0450 08/25/23 0739  HGB 8.6*   < > 9.3* 9.0* 8.7* 7.8* 7.8*  MCV 90.3   < > 92.3 91.9 91.5 91.6 91.4  VITAMINB12 275  --   --   --   --   --   --   FOLATE 3.8*  --   --   --   --   --   --   FERRITIN 188  --   --   --   --   --   --   TIBC 289  --   --   --   --   --   --  IRON 86  --   --   --   --   --   --   RETICCTPCT 0.8  --   --   --   --   --   --    < > = values in this interval not displayed.    Cardiac Enzymes: No results for input(s): "CKTOTAL", "CKMB", "CKMBINDEX", "TROPONINI" in the last 168 hours. CBG: Recent Labs  Lab 08/24/23 0832 08/24/23 1124 08/24/23 1521 08/24/23 1941 08/25/23 0734  GLUCAP 126* 181* 168* 230* 103*    Iron Studies: No results for input(s): "IRON", "TIBC", "TRANSFERRIN", "FERRITIN" in the last 72 hours. Studies/Results: No results found.  Medications: Infusions:  ceFEPime (MAXIPIME) IV 1 g (08/24/23 1630)    Scheduled Medications:  azithromycin  500 mg Oral Daily   Chlorhexidine Gluconate Cloth  6 each Topical Q0600   [START ON 08/26/2023] ciprofloxacin  500 mg Oral Daily   cycloSPORINE  1 drop Both Eyes BID   dextromethorphan-guaiFENesin  1 tablet Oral BID   dorzolamide-timolol  1 drop Both Eyes BID   folic acid  1,000 mcg Oral Daily   heparin  5,000 Units Subcutaneous Q8H   insulin aspart  0-5 Units Subcutaneous QHS   insulin aspart  0-6 Units Subcutaneous TID WC   insulin glargine-yfgn  10 Units Subcutaneous QHS   latanoprost  1 drop Both Eyes QHS   metoprolol tartrate  12.5 mg Oral BID   tamsulosin  0.4 mg Oral Q supper    have reviewed scheduled and prn medications.  Physical Exam: General:NAD, comfortable Heart:normal rate, no rub Lungs:clear Philip/l,  no crackle Abdomen:soft, Non-tender, non-distended Extremities:No edema, warm and well perfused Dialysis Access: Right IJ TDC in place.  Philip Benjamin Philip Benjamin 08/25/2023,11:41 AM  LOS: 4 days

## 2023-08-26 LAB — CULTURE, BLOOD (ROUTINE X 2)
Culture: NO GROWTH
Culture: NO GROWTH
Special Requests: ADEQUATE

## 2023-08-26 LAB — LEGIONELLA PNEUMOPHILA SEROGP 1 UR AG: L. pneumophila Serogp 1 Ur Ag: NEGATIVE

## 2023-09-04 ENCOUNTER — Inpatient Hospital Stay: Payer: Medicare Other | Admitting: Oncology

## 2023-09-04 ENCOUNTER — Inpatient Hospital Stay: Payer: Medicare Other

## 2023-09-04 DIAGNOSIS — F419 Anxiety disorder, unspecified: Secondary | ICD-10-CM | POA: Insufficient documentation

## 2023-09-04 DIAGNOSIS — F32A Depression, unspecified: Secondary | ICD-10-CM | POA: Insufficient documentation

## 2023-09-08 NOTE — Addendum Note (Signed)
Encounter addended by: Edward Qualia on: 09/08/2023 12:35 PM  Actions taken: Imaging Exam ended, Flowsheet accepted

## 2023-09-29 ENCOUNTER — Encounter (HOSPITAL_COMMUNITY): Payer: Self-pay

## 2023-09-29 ENCOUNTER — Emergency Department (HOSPITAL_COMMUNITY): Payer: Medicare Other

## 2023-09-29 ENCOUNTER — Other Ambulatory Visit: Payer: Self-pay

## 2023-09-29 ENCOUNTER — Inpatient Hospital Stay (HOSPITAL_COMMUNITY)
Admission: EM | Admit: 2023-09-29 | Discharge: 2023-10-10 | DRG: 177 | Disposition: A | Discharge status: Skilled Nursing Facility | Payer: Medicare Other | Attending: Internal Medicine | Admitting: Internal Medicine

## 2023-09-29 DIAGNOSIS — I10 Essential (primary) hypertension: Secondary | ICD-10-CM | POA: Diagnosis present

## 2023-09-29 DIAGNOSIS — E1165 Type 2 diabetes mellitus with hyperglycemia: Secondary | ICD-10-CM | POA: Diagnosis present

## 2023-09-29 DIAGNOSIS — H409 Unspecified glaucoma: Secondary | ICD-10-CM | POA: Diagnosis present

## 2023-09-29 DIAGNOSIS — Z992 Dependence on renal dialysis: Secondary | ICD-10-CM | POA: Diagnosis not present

## 2023-09-29 DIAGNOSIS — Z79899 Other long term (current) drug therapy: Secondary | ICD-10-CM

## 2023-09-29 DIAGNOSIS — E876 Hypokalemia: Secondary | ICD-10-CM | POA: Diagnosis present

## 2023-09-29 DIAGNOSIS — U071 COVID-19: Principal | ICD-10-CM | POA: Diagnosis present

## 2023-09-29 DIAGNOSIS — Z794 Long term (current) use of insulin: Secondary | ICD-10-CM

## 2023-09-29 DIAGNOSIS — E785 Hyperlipidemia, unspecified: Secondary | ICD-10-CM | POA: Diagnosis present

## 2023-09-29 DIAGNOSIS — I959 Hypotension, unspecified: Secondary | ICD-10-CM | POA: Diagnosis not present

## 2023-09-29 DIAGNOSIS — R531 Weakness: Secondary | ICD-10-CM

## 2023-09-29 DIAGNOSIS — F32A Depression, unspecified: Secondary | ICD-10-CM | POA: Diagnosis present

## 2023-09-29 DIAGNOSIS — N186 End stage renal disease: Secondary | ICD-10-CM

## 2023-09-29 DIAGNOSIS — N2581 Secondary hyperparathyroidism of renal origin: Secondary | ICD-10-CM | POA: Diagnosis present

## 2023-09-29 DIAGNOSIS — H9193 Unspecified hearing loss, bilateral: Secondary | ICD-10-CM | POA: Diagnosis present

## 2023-09-29 DIAGNOSIS — T380X5A Adverse effect of glucocorticoids and synthetic analogues, initial encounter: Secondary | ICD-10-CM | POA: Diagnosis present

## 2023-09-29 DIAGNOSIS — E860 Dehydration: Secondary | ICD-10-CM | POA: Diagnosis present

## 2023-09-29 DIAGNOSIS — F419 Anxiety disorder, unspecified: Secondary | ICD-10-CM | POA: Diagnosis present

## 2023-09-29 DIAGNOSIS — E11649 Type 2 diabetes mellitus with hypoglycemia without coma: Secondary | ICD-10-CM | POA: Diagnosis not present

## 2023-09-29 DIAGNOSIS — N390 Urinary tract infection, site not specified: Secondary | ICD-10-CM | POA: Diagnosis present

## 2023-09-29 DIAGNOSIS — D631 Anemia in chronic kidney disease: Secondary | ICD-10-CM | POA: Diagnosis present

## 2023-09-29 DIAGNOSIS — N400 Enlarged prostate without lower urinary tract symptoms: Secondary | ICD-10-CM | POA: Diagnosis present

## 2023-09-29 DIAGNOSIS — J159 Unspecified bacterial pneumonia: Secondary | ICD-10-CM | POA: Diagnosis present

## 2023-09-29 DIAGNOSIS — J1282 Pneumonia due to coronavirus disease 2019: Secondary | ICD-10-CM | POA: Diagnosis present

## 2023-09-29 DIAGNOSIS — E1122 Type 2 diabetes mellitus with diabetic chronic kidney disease: Secondary | ICD-10-CM | POA: Diagnosis present

## 2023-09-29 DIAGNOSIS — E782 Mixed hyperlipidemia: Secondary | ICD-10-CM | POA: Diagnosis present

## 2023-09-29 DIAGNOSIS — K810 Acute cholecystitis: Secondary | ICD-10-CM | POA: Insufficient documentation

## 2023-09-29 DIAGNOSIS — I2510 Atherosclerotic heart disease of native coronary artery without angina pectoris: Secondary | ICD-10-CM | POA: Diagnosis present

## 2023-09-29 DIAGNOSIS — I120 Hypertensive chronic kidney disease with stage 5 chronic kidney disease or end stage renal disease: Secondary | ICD-10-CM | POA: Diagnosis present

## 2023-09-29 LAB — FERRITIN: Ferritin: 665 ng/mL — ABNORMAL HIGH (ref 24–336)

## 2023-09-29 LAB — CBC WITH DIFFERENTIAL/PLATELET
Abs Immature Granulocytes: 0.3 10*3/uL — ABNORMAL HIGH (ref 0.00–0.07)
Basophils Absolute: 0.1 10*3/uL (ref 0.0–0.1)
Basophils Relative: 0 %
Eosinophils Absolute: 0 10*3/uL (ref 0.0–0.5)
Eosinophils Relative: 0 %
HCT: 33.6 % — ABNORMAL LOW (ref 39.0–52.0)
Hemoglobin: 10.9 g/dL — ABNORMAL LOW (ref 13.0–17.0)
Immature Granulocytes: 1 %
Lymphocytes Relative: 2 %
Lymphs Abs: 0.7 10*3/uL (ref 0.7–4.0)
MCH: 29.8 pg (ref 26.0–34.0)
MCHC: 32.4 g/dL (ref 30.0–36.0)
MCV: 91.8 fL (ref 80.0–100.0)
Monocytes Absolute: 2 10*3/uL — ABNORMAL HIGH (ref 0.1–1.0)
Monocytes Relative: 7 %
Neutro Abs: 26.9 10*3/uL — ABNORMAL HIGH (ref 1.7–7.7)
Neutrophils Relative %: 90 %
Platelets: 161 10*3/uL (ref 150–400)
RBC: 3.66 MIL/uL — ABNORMAL LOW (ref 4.22–5.81)
RDW: 12.6 % (ref 11.5–15.5)
WBC: 29.9 10*3/uL — ABNORMAL HIGH (ref 4.0–10.5)
nRBC: 0 % (ref 0.0–0.2)

## 2023-09-29 LAB — COMPREHENSIVE METABOLIC PANEL
ALT: 28 U/L (ref 0–44)
AST: 37 U/L (ref 15–41)
Albumin: 2.7 g/dL — ABNORMAL LOW (ref 3.5–5.0)
Alkaline Phosphatase: 105 U/L (ref 38–126)
Anion gap: 14 (ref 5–15)
BUN: 37 mg/dL — ABNORMAL HIGH (ref 8–23)
CO2: 24 mmol/L (ref 22–32)
Calcium: 8.6 mg/dL — ABNORMAL LOW (ref 8.9–10.3)
Chloride: 96 mmol/L — ABNORMAL LOW (ref 98–111)
Creatinine, Ser: 4.51 mg/dL — ABNORMAL HIGH (ref 0.61–1.24)
GFR, Estimated: 13 mL/min — ABNORMAL LOW (ref >60)
Glucose, Bld: 116 mg/dL — ABNORMAL HIGH (ref 70–99)
Potassium: 2.8 mmol/L — ABNORMAL LOW (ref 3.5–5.1)
Sodium: 134 mmol/L — ABNORMAL LOW (ref 135–145)
Total Bilirubin: 1 mg/dL (ref <1.2)
Total Protein: 6.4 g/dL — ABNORMAL LOW (ref 6.5–8.1)

## 2023-09-29 LAB — C-REACTIVE PROTEIN: CRP: 32.9 mg/dL — ABNORMAL HIGH (ref <1.0)

## 2023-09-29 LAB — MAGNESIUM
Magnesium: 1.8 mg/dL (ref 1.7–2.4)
Magnesium: 1.8 mg/dL (ref 1.7–2.4)

## 2023-09-29 LAB — RESP PANEL BY RT-PCR (RSV, FLU A&B, COVID)  RVPGX2
Influenza A by PCR: NEGATIVE
Influenza B by PCR: NEGATIVE
Resp Syncytial Virus by PCR: NEGATIVE
SARS Coronavirus 2 by RT PCR: POSITIVE — AB

## 2023-09-29 LAB — URINALYSIS, ROUTINE W REFLEX MICROSCOPIC
Bilirubin Urine: NEGATIVE
Glucose, UA: NEGATIVE mg/dL
Ketones, ur: NEGATIVE mg/dL
Nitrite: NEGATIVE
Protein, ur: 100 mg/dL — AB
Specific Gravity, Urine: 1.017 (ref 1.005–1.030)
WBC, UA: >50 WBC/hpf (ref 0–5)
pH: 5 (ref 5.0–8.0)

## 2023-09-29 LAB — PHOSPHORUS: Phosphorus: 5.1 mg/dL — ABNORMAL HIGH (ref 2.5–4.6)

## 2023-09-29 LAB — GLUCOSE, CAPILLARY: Glucose-Capillary: 123 mg/dL — ABNORMAL HIGH (ref 70–99)

## 2023-09-29 LAB — SEDIMENTATION RATE: Sed Rate: 107 mm/h — ABNORMAL HIGH (ref 0–16)

## 2023-09-29 LAB — PROCALCITONIN: Procalcitonin: 2.05 ng/mL

## 2023-09-29 MED ORDER — CHLORHEXIDINE GLUCONATE CLOTH 2 % EX PADS
6.0000 | MEDICATED_PAD | Freq: Every day | CUTANEOUS | Status: DC
  Administered 2023-09-30 – 2023-10-10 (×11): 6 via TOPICAL

## 2023-09-29 MED ORDER — SODIUM CHLORIDE 0.9 % IV SOLN
250.0000 mL | INTRAVENOUS | Status: AC | PRN
  Administered 2023-09-29: 250 mL via INTRAVENOUS

## 2023-09-29 MED ORDER — ONDANSETRON HCL 4 MG/2ML IJ SOLN
4.0000 mg | Freq: Four times a day (QID) | INTRAMUSCULAR | Status: DC | PRN
  Administered 2023-09-29 – 2023-10-07 (×7): 4 mg via INTRAVENOUS
  Filled 2023-09-29 (×7): qty 2

## 2023-09-29 MED ORDER — IPRATROPIUM-ALBUTEROL 0.5-2.5 (3) MG/3ML IN SOLN: 3.0000 mL | Freq: Four times a day (QID) | RESPIRATORY_TRACT | Status: DC | PRN

## 2023-09-29 MED ORDER — ONDANSETRON HCL 4 MG PO TABS: 4.0000 mg | ORAL_TABLET | Freq: Four times a day (QID) | ORAL | Status: DC | PRN

## 2023-09-29 MED ORDER — ACETAMINOPHEN 325 MG PO TABS
650.0000 mg | ORAL_TABLET | Freq: Four times a day (QID) | ORAL | Status: DC | PRN
  Administered 2023-10-01 – 2023-10-03 (×4): 650 mg via ORAL
  Filled 2023-09-29 (×4): qty 2

## 2023-09-29 MED ORDER — RENA-VITE PO TABS
1.0000 | ORAL_TABLET | Freq: Every day | ORAL | Status: DC
  Administered 2023-09-29 – 2023-10-09 (×11): 1 via ORAL
  Filled 2023-09-29 (×11): qty 1

## 2023-09-29 MED ORDER — SODIUM CHLORIDE 0.9% FLUSH
3.0000 mL | Freq: Two times a day (BID) | INTRAVENOUS | Status: DC
  Administered 2023-09-29 – 2023-10-10 (×21): 3 mL via INTRAVENOUS

## 2023-09-29 MED ORDER — METOPROLOL TARTRATE 25 MG PO TABS
12.5000 mg | ORAL_TABLET | Freq: Two times a day (BID) | ORAL | Status: DC
  Administered 2023-09-30 – 2023-10-03 (×6): 12.5 mg via ORAL
  Filled 2023-09-29 (×8): qty 1

## 2023-09-29 MED ORDER — POTASSIUM CHLORIDE CRYS ER 20 MEQ PO TBCR
40.0000 meq | EXTENDED_RELEASE_TABLET | Freq: Once | ORAL | Status: AC
  Administered 2023-09-29: 40 meq via ORAL
  Filled 2023-09-29 (×2): qty 2

## 2023-09-29 MED ORDER — SODIUM CHLORIDE 0.9 % IV SOLN: 500.0000 mg | Freq: Once | INTRAVENOUS | Status: DC

## 2023-09-29 MED ORDER — TAMSULOSIN HCL 0.4 MG PO CAPS
0.4000 mg | ORAL_CAPSULE | Freq: Every day | ORAL | Status: DC
  Administered 2023-09-30 – 2023-10-02 (×3): 0.4 mg via ORAL
  Filled 2023-09-29 (×3): qty 1

## 2023-09-29 MED ORDER — PRAVASTATIN SODIUM 40 MG PO TABS
80.0000 mg | ORAL_TABLET | Freq: Every day | ORAL | Status: DC
  Administered 2023-09-29 – 2023-10-04 (×6): 80 mg via ORAL
  Filled 2023-09-29 (×7): qty 2

## 2023-09-29 MED ORDER — ACETAMINOPHEN 650 MG RE SUPP: 650.0000 mg | Freq: Four times a day (QID) | RECTAL | Status: DC | PRN

## 2023-09-29 MED ORDER — AZITHROMYCIN 250 MG PO TABS
500.0000 mg | ORAL_TABLET | Freq: Every day | ORAL | Status: DC
  Administered 2023-09-29 – 2023-10-04 (×6): 500 mg via ORAL
  Filled 2023-09-29 (×6): qty 2

## 2023-09-29 MED ORDER — HEPARIN SODIUM (PORCINE) 5000 UNIT/ML IJ SOLN
5000.0000 [IU] | Freq: Three times a day (TID) | INTRAMUSCULAR | Status: DC
  Administered 2023-09-29 – 2023-10-05 (×19): 5000 [IU] via SUBCUTANEOUS
  Filled 2023-09-29 (×20): qty 1

## 2023-09-29 MED ORDER — INSULIN ASPART 100 UNIT/ML IJ SOLN
0.0000 [IU] | Freq: Three times a day (TID) | INTRAMUSCULAR | Status: DC
  Administered 2023-09-30: 2 [IU] via SUBCUTANEOUS
  Administered 2023-09-30 (×2): 5 [IU] via SUBCUTANEOUS
  Administered 2023-10-01: 7 [IU] via SUBCUTANEOUS
  Administered 2023-10-01: 9 [IU] via SUBCUTANEOUS
  Administered 2023-10-01: 3 [IU] via SUBCUTANEOUS
  Administered 2023-10-02: 7 [IU] via SUBCUTANEOUS
  Administered 2023-10-02: 9 [IU] via SUBCUTANEOUS
  Administered 2023-10-03: 1 [IU] via SUBCUTANEOUS
  Administered 2023-10-03: 7 [IU] via SUBCUTANEOUS
  Administered 2023-10-04: 2 [IU] via SUBCUTANEOUS
  Administered 2023-10-04: 1 [IU] via SUBCUTANEOUS

## 2023-09-29 MED ORDER — LATANOPROST 0.005 % OP SOLN
1.0000 [drp] | Freq: Every day | OPHTHALMIC | Status: DC
  Administered 2023-09-29 – 2023-10-09 (×11): 1 [drp] via OPHTHALMIC
  Filled 2023-09-29: qty 2.5

## 2023-09-29 MED ORDER — ALPRAZOLAM 0.25 MG PO TABS
0.2500 mg | ORAL_TABLET | Freq: Three times a day (TID) | ORAL | Status: DC | PRN
  Administered 2023-09-30 – 2023-10-09 (×12): 0.25 mg via ORAL
  Filled 2023-09-29 (×15): qty 1

## 2023-09-29 MED ORDER — SODIUM CHLORIDE 0.9% FLUSH
3.0000 mL | INTRAVENOUS | Status: DC | PRN
Start: 2023-09-29 — End: 2023-10-08

## 2023-09-29 MED ORDER — ACETAMINOPHEN 500 MG PO TABS
1000.0000 mg | ORAL_TABLET | Freq: Once | ORAL | Status: AC
  Administered 2023-09-29: 1000 mg via ORAL
  Filled 2023-09-29: qty 2

## 2023-09-29 MED ORDER — FOLIC ACID 1 MG PO TABS
1000.0000 ug | ORAL_TABLET | Freq: Every day | ORAL | Status: DC
  Administered 2023-09-29 – 2023-10-09 (×11): 1 mg via ORAL
  Filled 2023-09-29 (×12): qty 1

## 2023-09-29 MED ORDER — HYDROCOD POLI-CHLORPHE POLI ER 10-8 MG/5ML PO SUER
5.0000 mL | Freq: Two times a day (BID) | ORAL | Status: DC | PRN
  Administered 2023-09-29: 5 mL via ORAL
  Filled 2023-09-29 (×2): qty 5

## 2023-09-29 MED ORDER — METHYLPREDNISOLONE SODIUM SUCC 40 MG IJ SOLR
40.0000 mg | Freq: Two times a day (BID) | INTRAMUSCULAR | Status: DC
Start: 2023-09-29 — End: 2023-10-01
  Administered 2023-09-29 – 2023-10-01 (×5): 40 mg via INTRAVENOUS
  Filled 2023-09-29 (×5): qty 1

## 2023-09-29 MED ORDER — CEFTRIAXONE SODIUM 1 G IJ SOLR
1.0000 g | INTRAMUSCULAR | Status: DC
  Administered 2023-09-29 – 2023-09-30 (×2): 1 g via INTRAVENOUS
  Filled 2023-09-29 (×2): qty 10

## 2023-09-29 MED ORDER — CALCITRIOL 0.25 MCG PO CAPS
0.2500 ug | ORAL_CAPSULE | Freq: Every day | ORAL | Status: DC
Start: 2023-09-29 — End: 2023-10-10
  Administered 2023-09-29 – 2023-10-10 (×12): 0.25 ug via ORAL
  Filled 2023-09-29 (×13): qty 1

## 2023-09-29 MED ORDER — DORZOLAMIDE HCL-TIMOLOL MAL 2-0.5 % OP SOLN
1.0000 [drp] | Freq: Two times a day (BID) | OPHTHALMIC | Status: DC
  Administered 2023-09-29 – 2023-10-10 (×22): 1 [drp] via OPHTHALMIC
  Filled 2023-09-29 (×3): qty 10

## 2023-09-29 MED ORDER — CYCLOSPORINE 0.05 % OP EMUL
1.0000 [drp] | Freq: Two times a day (BID) | OPHTHALMIC | Status: DC
  Administered 2023-09-29 – 2023-10-10 (×22): 1 [drp] via OPHTHALMIC
  Filled 2023-09-29 (×22): qty 30

## 2023-09-29 MED ORDER — FLUOXETINE HCL 20 MG/5ML PO SOLN
20.0000 mg | Freq: Every day | ORAL | Status: DC
  Filled 2023-09-29 (×3): qty 5

## 2023-09-29 MED ORDER — SODIUM CHLORIDE 0.9 % IV SOLN: 1.0000 g | Freq: Once | INTRAVENOUS | Status: DC

## 2023-09-29 MED ORDER — INSULIN ASPART 100 UNIT/ML IJ SOLN
0.0000 [IU] | Freq: Every day | INTRAMUSCULAR | Status: DC
  Administered 2023-10-01: 5 [IU] via SUBCUTANEOUS

## 2023-09-29 NOTE — Plan of Care (Signed)
Informed of ESRD patient in ER. Presents with COVID.  Does HD on MWF. Did not have HD today. Labs and CXR reviewed. No urgent indications for HD today. Will hold off on HD today and will tentatively plan for HD tomorrow. Virtual consult to follow in AM.  Called outpatient HD unit: Outpatient orders: [facility], MWF. 4 hours. EDW 90kg. TDC (has appt coming up with [facility] on 10/07/23 for perm access consideration). Flow rates: 350/500. 2K, 2.5Ca. Heparin: 1000 units/hr. Meds: Mircera every 4 weeks (last dose 09/15/23), Venofer 50mg  every Monday.  Please call with any questions/concerns in the interim. Discussed with primary service.  Anthony Sar, MD [facility]

## 2023-09-29 NOTE — ED Triage Notes (Signed)
Pt complains of generalized weakness, nausea, and loss of appetite x 1 week. Pt states he was discharged from SNF around a week ago. Pt has been on dialysis for approximately a month and was due to go today. Pt states he was too weak to go. CBG 150 mg/dl per EMS.

## 2023-09-29 NOTE — ED Provider Notes (Signed)
EMERGENCY DEPARTMENT AT [facility] Provider Note   CSN: [id number] Arrival date & time: 09/29/23  9528     History  Chief Complaint  Patient presents with   Weakness    Philip Benjamin is a 72 y.o. male.  With a history of ESRD on dialysis (MWF) type 2 diabetes and UTI presents to the ED for generalized weakness.  Patient was recently discharged from skilled nursing facility back to home under the care of his daughter and wife.  Over the last 2 days he was too weak to walk around which is unusual for him.  Was supposed to have dialysis this morning but was unable to attend the appointment due to generalized weakness.  He does note nausea and coughing as well and has pain over his right ribs when he coughs.  Couple episodes of posttussive emesis.  No chest pain, abdominal pain, fevers or chills.   Weakness      Home Medications Prior to Admission medications   Medication Sig Start Date End Date Taking? Authorizing Provider  acetaminophen (TYLENOL) 650 MG CR tablet Take 650 mg by mouth every 6 (six) hours as needed for pain.    [provider]  Cholecalciferol 125 MCG (5000 UT) capsule Take by mouth.    [provider]  cycloSPORINE (RESTASIS) 0.05 % ophthalmic emulsion SMARTSIG:In Eye(s) 08/13/23   [provider]  Dorzolamide HCl-Timolol Mal PF 2-0.5 % SOLN Apply 1 drop to eye 2 (two) times daily. 08/18/23   [provider]  folic acid (FOLVITE) 800 MCG tablet Take 800 mcg by mouth daily.    [provider]  Insulin Glargine (BASAGLAR TEMPO PEN [place]) Inject 30 Units into the skin at bedtime. Basaglar/Lantus    [provider]  latanoprost (XALATAN) 0.005 % ophthalmic solution Place 1 drop into both eyes at bedtime. 08/25/23   Catarina Hartshorn, MD  metoprolol tartrate (LOPRESSOR) 25 MG tablet Take 0.5 tablets (12.5 mg total) by mouth 2 (two) times daily. 08/20/23   Catarina Hartshorn, MD  multivitamin (RENA-VIT) TABS tablet  Take 1 tablet by mouth at bedtime. 08/20/23   Catarina Hartshorn, MD  nystatin cream (MYCOSTATIN) Apply 1 application topically 2 (two) times daily as needed. FOR YEAST OR SKIN RASHES. 12/16/17   [provider]  tamsulosin (FLOMAX) 0.4 MG CAPS capsule Take 0.4 mg by mouth daily with supper. 12/16/17   [provider]      Allergies    Patient has no known allergies.    Review of Systems   Review of Systems  Neurological:  Positive for weakness.    Physical Exam Updated Vital Signs BP 115/72   Pulse 87   Temp 98.1 F (36.7 C) (Oral)   Resp (!) 29   Ht 6\' 1"  (1.854 m)   Wt 90.7 kg   SpO2 97%   BMI 26.39 kg/m  Physical Exam Vitals and nursing note reviewed.  HENT:     Head: Normocephalic and atraumatic.  Eyes:     Pupils: Pupils are equal, round, and reactive to light.  Cardiovascular:     Rate and Rhythm: Normal rate and regular rhythm.  Pulmonary:     Effort: Pulmonary effort is normal.     Breath sounds: Normal breath sounds.  Abdominal:     Palpations: Abdomen is soft.     Tenderness: There is no abdominal tenderness.  Musculoskeletal:        General: No swelling.     Right lower  leg: No edema.     Left lower leg: No edema.  Skin:    General: Skin is warm and dry.  Neurological:     General: No focal deficit present.     Mental Status: He is alert.     Sensory: No sensory deficit.     Motor: No weakness.  Psychiatric:        Mood and Affect: Mood normal.     ED Results / Procedures / Treatments   Labs (all labs ordered are listed, but only abnormal results are displayed) Labs Reviewed  RESP PANEL BY RT-PCR (RSV, FLU A&B, COVID)  RVPGX2 - Abnormal; Notable for the following components:      Result Value   SARS Coronavirus 2 by RT PCR POSITIVE (*)    All other components within normal limits  COMPREHENSIVE METABOLIC PANEL - Abnormal; Notable for the following components:   Sodium 134 (*)    Potassium 2.8 (*)    Chloride 96 (*)    Glucose, Bld  116 (*)    BUN 37 (*)    Creatinine, Ser 4.51 (*)    Calcium 8.6 (*)    Total Protein 6.4 (*)    Albumin 2.7 (*)    GFR, Estimated 13 (*)    All other components within normal limits  CBC WITH DIFFERENTIAL/PLATELET - Abnormal; Notable for the following components:   WBC 29.9 (*)    RBC 3.66 (*)    Hemoglobin 10.9 (*)    HCT 33.6 (*)    Neutro Abs 26.9 (*)    Monocytes Absolute 2.0 (*)    Abs Immature Granulocytes 0.30 (*)    All other components within normal limits  MAGNESIUM  URINALYSIS, ROUTINE W REFLEX MICROSCOPIC    EKG EKG Interpretation Date/Time:  Monday September 29 2023 10:05:30 EST Ventricular Rate:  77 PR Interval:  168 QRS Duration:  109 QT Interval:  467 QTC Calculation: 529 R Axis:   77  Text Interpretation: Sinus rhythm Abnormal inferior Q waves Prolonged QT interval Confirmed by Estelle June [phone]) on 09/29/2023 2:00:28 PM  Radiology DG Chest 2 View  Result Date: 09/29/2023 CLINICAL DATA:  Weakness, nausea. EXAM: CHEST - 2 VIEW COMPARISON:  Same day. FINDINGS: Stable cardiomediastinal silhouette. Right internal jugular dialysis catheter is unchanged. Hypoinflation of the lungs is noted with probable bibasilar subsegmental atelectasis and possible small pleural effusions. Bony thorax is unremarkable. IMPRESSION: Hypoinflation of lungs with probable bibasilar subsegmental atelectasis and possible small pleural effusions. Electronically Signed   By: Lupita Raider M.D.   On: 09/29/2023 12:53   DG Chest Portable 1 View  Result Date: 09/29/2023 CLINICAL DATA:  ?Pna Pt complains of generalized weakness, nausea, and loss of appetite x 1 week. Pt states he was discharged from SNF around a week ago EXAM: PORTABLE CHEST 1 VIEW COMPARISON:  Chest x-ray 08/14/2023 FINDINGS: Right chest wall dialysis catheter with tip overlying the right atrium. The heart and mediastinal contours are within normal limits. Low lung volumes. Bibasilar airspace opacities. No pulmonary  edema. Likely trace bilateral pleural effusion. No pneumothorax. No acute osseous abnormality. IMPRESSION: 1. Low lung volumes with bibasilar airspace opacities that could represent a combination of atelectasis and/or infection/inflammation. Recommend repeat PA and lateral view with improved inspiratory effort for further evaluation. 2. Likely trace bilateral pleural effusions. Electronically Signed   By: Tish Frederickson M.D.   On: 09/29/2023 09:55    Procedures Procedures    Medications Ordered in ED Medications  acetaminophen (TYLENOL)  tablet 1,000 mg (1,000 mg Oral Given 09/29/23 1122)    ED Course/ Medical Decision Making/ A&P Clinical Course as of 09/29/23 1400  Mon Sep 29, 2023  1358 Laboratory workup notable for leukocytosis 29.9 however patient has had seemingly proportionate leukocytosis in the past not in the setting of acute infection.  COVID test is positive.  Renal function at baseline without severe hyperkalemia.  Discussed with admitting hospitalist.  Considering the COVID is most likely etiology of symptoms, will await for inflammatory markers before initiating antibiotic therapy.  Patient will be admitted to medicine [MP]    Clinical Course User Index [MP] Royanne Foots, DO                                 Medical Decision Making 72 year old male with history as above recently started on dialysis here for generalized weakness nausea dry cough.  Afebrile and normotensive on exam.  Was scheduled to have dialysis today but did not attend the appointment due to generalized weakness.  No focal neurologic deficits on my exam with clear lung sounds bilaterally.  Low suspicion for acute CVA/TIA given generalized weakness and benign neurologic exam.  Differential diagnosis for generalized weakness includes acute infectious process such as pneumonia or UTI.  Will obtain chest x-ray, UA and laboratory workup.  Considering history of ESRD other potential etiologies would include anemia  and electrolyte imbalance.  Will obtain appropriate laboratory workup and ascertain need for dialysis appointment today or if he can wait in another 2 days as scheduled.  No overt pulmonary edema or fluid overload  Amount and/or Complexity of Data Reviewed Labs: ordered. Radiology: ordered.  Risk OTC drugs. Decision regarding hospitalization.           Final Clinical Impression(s) / ED Diagnoses Final diagnoses:  COVID  Weakness  ESRD on dialysis [facility])    Rx / DC Orders ED Discharge Orders     None         Royanne Foots, DO 09/29/23 1400

## 2023-09-29 NOTE — H&P (Signed)
History and Physical    Patient: Philip Benjamin:[id number] DOB: 1951-02-18 DOA: 09/29/2023 DOS: the patient was seen and examined on 09/29/2023 PCP: Kirstie Peri, MD  Patient coming from: Home  Chief Complaint:  Chief Complaint  Patient presents with   Weakness   HPI: Philip Benjamin is a 72 y.o. male with medical history significant of end-stage renal disease, BPH, type 2 diabetes mellitus, hypertension, hyperlipidemia and increase intraocular pressure/glaucoma; who presented to the hospital secondary to general malaise, nausea and weakness; patient has also expressed intermittent nonproductive coughing spells and mild shortness of breath.  Patient denies chills, fever, overt bleeding, focal weaknesses, headache or any other complaints.  Of note, patient has been released from the skilled nursing facility about 1 week prior to this hospitalization.  Workup in the ED demonstrated positive COVID-19 infection, mild dehydration and hypokalemia.  TRH has been consulted to place in the hospital for further evaluation and management.  Review of Systems: As mentioned in the history of present illness. All other systems reviewed and are negative. Past Medical History:  Diagnosis Date   BPH (benign prostatic hyperplasia)    Chronic kidney disease    ckd stage 3   Diabetes mellitus without complication (HCC)    Hypertension    Past Surgical History:  Procedure Laterality Date   EYE SURGERY     INGUINAL HERNIA REPAIR Bilateral 01/23/2018   Procedure: BILATERAL OPEN INGUINAL HERNIA REPAIR WITH MESH;  Surgeon: Jimmye Norman, MD;  Location: [facility] OR;  Service: General;  Laterality: Bilateral;   INSERTION OF MESH Bilateral 01/23/2018   Procedure: INSERTION OF MESH;  Surgeon: Jimmye Norman, MD;  Location: [facility] OR;  Service: General;  Laterality: Bilateral;   IR FLUORO GUIDE CV LINE RIGHT  08/15/2023   IR US GUIDE VASC ACCESS RIGHT  08/15/2023   VASECTOMY     ~1985   Social History:  reports  that he has never smoked. He has never used smokeless tobacco. He reports that he does not drink alcohol and does not use drugs.  No Known Allergies  History reviewed. No pertinent family history.  Prior to Admission medications   Medication Sig Start Date End Date Taking? Authorizing Provider  acetaminophen (TYLENOL) 650 MG CR tablet Take 650 mg by mouth every 6 (six) hours as needed for pain.   Yes [provider]  ALPRAZolam (XANAX) 0.25 MG tablet Take 0.25 mg by mouth 3 (three) times daily as needed for anxiety. 09/17/23 10/01/23 Yes [provider]  calcitRIOL (ROCALTROL) 0.25 MCG capsule Take 0.25 mcg by mouth daily.   Yes [provider]  Cholecalciferol 125 MCG (5000 UT) capsule Take by mouth.   Yes [provider]  cycloSPORINE (RESTASIS) 0.05 % ophthalmic emulsion SMARTSIG:In Eye(s) 08/13/23  Yes [provider]  Dorzolamide HCl-Timolol Mal PF 2-0.5 % SOLN Apply 1 drop to eye 2 (two) times daily. 08/18/23  Yes [provider]  FLUoxetine (PROZAC) 20 MG/5ML solution Take 5 mLs by mouth daily. 09/17/23 10/17/23 Yes [provider]  folic acid (FOLVITE) 800 MCG tablet Take 800 mcg by mouth daily.   Yes [provider]  LANTUS 100 UNIT/ML injection Inject 26 Units into the skin daily.   Yes [provider]  latanoprost (XALATAN) 0.005 % ophthalmic solution Place 1 drop into both eyes at bedtime. Patient taking differently: Place 1 drop into the left eye at bedtime. 08/25/23  Yes Tat, Onalee Hua, MD  losartan (COZAAR) 100 MG tablet Take 100 mg by mouth  daily.   Yes [provider]  metoprolol tartrate (LOPRESSOR) 25 MG tablet Take 0.5 tablets (12.5 mg total) by mouth 2 (two) times daily. 08/20/23  Yes Tat, Onalee Hua, MD  multivitamin (RENA-VIT) TABS tablet Take 1 tablet by mouth at bedtime. 08/20/23  Yes Tat, Onalee Hua, MD  nystatin cream (MYCOSTATIN) Apply 1 application topically 2 (two) times daily as needed. FOR  YEAST OR SKIN RASHES. 12/16/17  Yes [provider]  pravastatin (PRAVACHOL) 80 MG tablet Take 1 tablet by mouth daily. 09/17/23 10/17/23 Yes [provider]  tamsulosin (FLOMAX) 0.4 MG CAPS capsule Take 0.4 mg by mouth daily with supper. 12/16/17  Yes [provider]    Physical Exam: Vitals:   09/29/23 1500 09/29/23 1515 09/29/23 1530 09/29/23 1639  BP: 114/74 118/79 116/77 134/75  Pulse: 93 94 92 87  Resp: (!) 32 (!) 25  (!) 26  Temp:   97.9 F (36.6 C) 98.3 F (36.8 C)  TempSrc:   Oral Oral  SpO2: 97% 98% 98% 96%  Weight:      Height:       General exam: Alert, awake, oriented x 3; generally weak and deconditioned.  Afebrile. Respiratory system: No using accessory muscles; good saturation on room air. Cardiovascular system:RRR. No rubs or gallops.  No JVD. Gastrointestinal system: Abdomen is nondistended, soft and nontender. No organomegaly or masses felt. Normal bowel sounds heard. Central nervous system: No focal neurological deficits. Extremities: No cyanosis or clubbing. Skin: No petechiae. Psychiatry: Judgement and insight appear normal. Mood & affect appropriate.   Data Reviewed: COVID PCR: Positive -RSV/influenza PCR: Negative Magnesium: 1.8 Phosphorus: 5.1 Procalcitonin 2.05 Ferritin: 665 CBC: WBCs 29.9, hemoglobin 10.9 and platelet count 161K Comprehensive metabolic panel: Sodium 134, potassium 2.8, chloride 96, bicarb 24, glucose 116, BUN 37, creatinine 4.51, albumin 2.7 and GFR 13.   Assessment and Plan: 1-COVID infection -No hypoxia appreciated -Patient high risk for decompensation given history of ESRD -Bronchodilators, supportive care and the steroids will be provided -Will follow inflammatory markers and clinical response.  2-leukocytosis/elevated procalcitonin -Concerns for superimposed bacterial infection in the setting Of leukocytosis and elevated procalcitonin level -Rocephin and Zithromax will be provided -Follow  clinical response. -Bronchodilator management, mucolytic's and antitussive medications will be provided. -Flutter valve has been ordered.  3-ESRD -Nephrology service has been consulted for continuation of dialysis -outpatient schedule M-W-F  4-hypokalemia -Electrolytes will be repleted -Follow trend. -Adjust dialysis bath for further stabilization.  5-hypertension/hyperlipidemia -Continue treatment with Lopressor and Pravachol.  6-history of BPH -Continue Flomax.  7-increased intraocular pressure -Continue latanoprost and the use of dorzolamide/timolol.  8-depression/anxiety -Currently stable mood appreciated -Continue as needed Xanax and the use of Prozac.    Advance Care Planning:   Code Status: Full Code   Consults: Nephrology service  Family Communication: No family at bedside.  Severity of Illness: The appropriate patient status for this patient is OBSERVATION. Observation status is judged to be reasonable and necessary in order to provide the required intensity of service to ensure the patient's safety. The patient's presenting symptoms, physical exam findings, and initial radiographic and laboratory data in the context of their medical condition is felt to place them at decreased risk for further clinical deterioration. Furthermore, it is anticipated that the patient will be medically stable for discharge from the hospital within 2 midnights of admission.   Author: Vassie Loll, MD 09/29/2023 7:24 PM  For on call review [email or web address].

## 2023-09-30 ENCOUNTER — Other Ambulatory Visit: Payer: Self-pay

## 2023-09-30 DIAGNOSIS — E1165 Type 2 diabetes mellitus with hyperglycemia: Secondary | ICD-10-CM | POA: Diagnosis not present

## 2023-09-30 DIAGNOSIS — N400 Enlarged prostate without lower urinary tract symptoms: Secondary | ICD-10-CM | POA: Diagnosis present

## 2023-09-30 DIAGNOSIS — D631 Anemia in chronic kidney disease: Secondary | ICD-10-CM | POA: Diagnosis present

## 2023-09-30 DIAGNOSIS — N390 Urinary tract infection, site not specified: Secondary | ICD-10-CM | POA: Diagnosis present

## 2023-09-30 DIAGNOSIS — I2510 Atherosclerotic heart disease of native coronary artery without angina pectoris: Secondary | ICD-10-CM | POA: Diagnosis present

## 2023-09-30 DIAGNOSIS — J189 Pneumonia, unspecified organism: Secondary | ICD-10-CM

## 2023-09-30 DIAGNOSIS — Z794 Long term (current) use of insulin: Secondary | ICD-10-CM | POA: Diagnosis not present

## 2023-09-30 DIAGNOSIS — E876 Hypokalemia: Secondary | ICD-10-CM | POA: Diagnosis present

## 2023-09-30 DIAGNOSIS — H409 Unspecified glaucoma: Secondary | ICD-10-CM | POA: Diagnosis present

## 2023-09-30 DIAGNOSIS — K810 Acute cholecystitis: Secondary | ICD-10-CM | POA: Diagnosis present

## 2023-09-30 DIAGNOSIS — R531 Weakness: Secondary | ICD-10-CM | POA: Diagnosis present

## 2023-09-30 DIAGNOSIS — N186 End stage renal disease: Secondary | ICD-10-CM | POA: Diagnosis present

## 2023-09-30 DIAGNOSIS — U071 COVID-19: Secondary | ICD-10-CM | POA: Diagnosis present

## 2023-09-30 DIAGNOSIS — F419 Anxiety disorder, unspecified: Secondary | ICD-10-CM | POA: Diagnosis present

## 2023-09-30 DIAGNOSIS — J159 Unspecified bacterial pneumonia: Secondary | ICD-10-CM | POA: Diagnosis present

## 2023-09-30 DIAGNOSIS — E1122 Type 2 diabetes mellitus with diabetic chronic kidney disease: Secondary | ICD-10-CM | POA: Diagnosis present

## 2023-09-30 DIAGNOSIS — Z992 Dependence on renal dialysis: Secondary | ICD-10-CM | POA: Diagnosis not present

## 2023-09-30 DIAGNOSIS — T380X5A Adverse effect of glucocorticoids and synthetic analogues, initial encounter: Secondary | ICD-10-CM | POA: Diagnosis present

## 2023-09-30 DIAGNOSIS — E782 Mixed hyperlipidemia: Secondary | ICD-10-CM | POA: Diagnosis not present

## 2023-09-30 DIAGNOSIS — J1282 Pneumonia due to coronavirus disease 2019: Secondary | ICD-10-CM | POA: Diagnosis present

## 2023-09-30 DIAGNOSIS — E11649 Type 2 diabetes mellitus with hypoglycemia without coma: Secondary | ICD-10-CM | POA: Diagnosis not present

## 2023-09-30 DIAGNOSIS — N2581 Secondary hyperparathyroidism of renal origin: Secondary | ICD-10-CM | POA: Diagnosis present

## 2023-09-30 DIAGNOSIS — F32A Depression, unspecified: Secondary | ICD-10-CM | POA: Diagnosis present

## 2023-09-30 DIAGNOSIS — I10 Essential (primary) hypertension: Secondary | ICD-10-CM | POA: Diagnosis not present

## 2023-09-30 DIAGNOSIS — E785 Hyperlipidemia, unspecified: Secondary | ICD-10-CM | POA: Diagnosis present

## 2023-09-30 DIAGNOSIS — I120 Hypertensive chronic kidney disease with stage 5 chronic kidney disease or end stage renal disease: Secondary | ICD-10-CM | POA: Diagnosis present

## 2023-09-30 DIAGNOSIS — Z79899 Other long term (current) drug therapy: Secondary | ICD-10-CM | POA: Diagnosis not present

## 2023-09-30 DIAGNOSIS — E860 Dehydration: Secondary | ICD-10-CM | POA: Diagnosis present

## 2023-09-30 LAB — CBC
HCT: 33 % — ABNORMAL LOW (ref 39.0–52.0)
Hemoglobin: 10.9 g/dL — ABNORMAL LOW (ref 13.0–17.0)
MCH: 29.6 pg (ref 26.0–34.0)
MCHC: 33 g/dL (ref 30.0–36.0)
MCV: 89.7 fL (ref 80.0–100.0)
Platelets: 188 10*3/uL (ref 150–400)
RBC: 3.68 MIL/uL — ABNORMAL LOW (ref 4.22–5.81)
RDW: 12.7 % (ref 11.5–15.5)
WBC: 25.6 10*3/uL — ABNORMAL HIGH (ref 4.0–10.5)
nRBC: 0 % (ref 0.0–0.2)

## 2023-09-30 LAB — BASIC METABOLIC PANEL
Anion gap: 14 (ref 5–15)
BUN: 49 mg/dL — ABNORMAL HIGH (ref 8–23)
CO2: 23 mmol/L (ref 22–32)
Calcium: 8.6 mg/dL — ABNORMAL LOW (ref 8.9–10.3)
Chloride: 97 mmol/L — ABNORMAL LOW (ref 98–111)
Creatinine, Ser: 4.9 mg/dL — ABNORMAL HIGH (ref 0.61–1.24)
GFR, Estimated: 12 mL/min — ABNORMAL LOW (ref >60)
Glucose, Bld: 142 mg/dL — ABNORMAL HIGH (ref 70–99)
Potassium: 3.2 mmol/L — ABNORMAL LOW (ref 3.5–5.1)
Sodium: 134 mmol/L — ABNORMAL LOW (ref 135–145)

## 2023-09-30 LAB — GLUCOSE, CAPILLARY
Glucose-Capillary: 173 mg/dL — ABNORMAL HIGH (ref 70–99)
Glucose-Capillary: 271 mg/dL — ABNORMAL HIGH (ref 70–99)
Glucose-Capillary: 257 mg/dL — ABNORMAL HIGH (ref 70–99)
Glucose-Capillary: 173 mg/dL — ABNORMAL HIGH (ref 70–99)

## 2023-09-30 LAB — MAGNESIUM: Magnesium: 1.9 mg/dL (ref 1.7–2.4)

## 2023-09-30 LAB — HEPATITIS B SURFACE ANTIGEN: Hepatitis B Surface Ag: NONREACTIVE

## 2023-09-30 MED ORDER — HEPARIN SODIUM (PORCINE) 1000 UNIT/ML IJ SOLN
INTRAMUSCULAR | Status: AC
  Filled 2023-09-30: qty 4

## 2023-09-30 MED ORDER — ORAL CARE MOUTH RINSE: 15.0000 mL | OROMUCOSAL | Status: DC | PRN

## 2023-09-30 MED ORDER — FLUOXETINE HCL 20 MG PO CAPS
20.0000 mg | ORAL_CAPSULE | Freq: Every day | ORAL | Status: DC
  Administered 2023-09-30 – 2023-10-09 (×10): 20 mg via ORAL
  Filled 2023-09-30 (×10): qty 1

## 2023-09-30 MED ORDER — HEPARIN SODIUM (PORCINE) 1000 UNIT/ML DIALYSIS
1000.0000 [IU] | INTRAMUSCULAR | Status: DC | PRN
  Administered 2023-09-30 – 2023-10-02 (×2): 1000 [IU] via INTRAVENOUS_CENTRAL

## 2023-09-30 MED ORDER — CHLORHEXIDINE GLUCONATE CLOTH 2 % EX PADS
6.0000 | MEDICATED_PAD | Freq: Every day | CUTANEOUS | Status: DC
  Administered 2023-09-30 – 2023-10-10 (×10): 6 via TOPICAL

## 2023-09-30 MED ORDER — ALTEPLASE 2 MG IJ SOLR
2.0000 mg | Freq: Once | INTRAMUSCULAR | Status: DC | PRN
Start: 2023-09-30 — End: 2023-10-08

## 2023-09-30 MED ORDER — HEPARIN SODIUM (PORCINE) 1000 UNIT/ML DIALYSIS
1000.0000 [IU] | INTRAMUSCULAR | Status: DC | PRN
  Administered 2023-09-30 – 2023-10-08 (×3): 3800 [IU]

## 2023-09-30 NOTE — Progress Notes (Signed)
Progress Note   Patient: Philip Benjamin YNW:[id number] DOB: 1951/08/23 DOA: 09/29/2023     0 DOS: the patient was seen and examined on 09/30/2023   Brief hospital course: CASSADY SANCHEZGARCIA is a 72 y.o. male with medical history significant of end-stage renal disease, BPH, type 2 diabetes mellitus, hypertension, hyperlipidemia and increase intraocular pressure/glaucoma; who presented to the hospital secondary to general malaise, nausea and weakness; patient has also expressed intermittent nonproductive coughing spells and mild shortness of breath.  Patient denies chills, fever, overt bleeding, focal weaknesses, headache or any other complaints.   Of note, patient has been released from the skilled nursing facility about 1 week prior to this hospitalization.   Workup in the ED demonstrated positive COVID-19 infection, mild dehydration and hypokalemia.   TRH has been consulted to place in the hospital for further evaluation and management.  Assessment and Plan: 1-COVID infection -Remains nonhypoxic; good saturation on room air. -As present intermittent nonproductive coughing spells and just mild short winded sensation with activity. -Continue treatment with supportive care, bronchodilators and steroids -Continue to follow intermittently inflammatory markers. -Patient reports feeling slightly better today.  2-pneumonia -Patient with elevated leukocytosis and also elevated procalcitonin -Bacterial superimposed infection is a high concern -Continue treatment with current antibiotics -Continue mucolytic's, flutter valve and bronchodilator as mentioned above -Follow clinical response.  3-ESRD -Appreciate assistance and recommendation by nephrology -Planning for hemodialysis today and then back on schedule on 10/01/2023.  4-hypokalemia -Repleted -Continue to follow electrolytes as they will be further stabilized with dialysis.  5-hypertension/hyperlipidemia -Continue statins and  current antihypertensive agents. -Follow vital signs.  6-history of BPH -Continue the use of Flomax.  7-increased intraocular pressure/glaucoma -Continue the use of latanoprost and also dorzolamide/timolol. -Continue outpatient follow-up with ophthalmology service.  8-depression/anxiety -Continue as needed Xanax and the use of Prozac.  9-generalized weakness -Most likely in the setting of pneumonia/COVID -PT evaluation will be requested.  10-anemia of chronic kidney disease -Stable hemoglobin -Iron and Epogen therapy as per nephrology discretion.  Subjective:  Expressing generalized weakness; improvement in nausea/vomiting reported.  Still with decreased appetite.  No chest pain, no requiring oxygen supplementation and expressing no overt bleeding.  Physical Exam: Vitals:   09/30/23 0007 09/30/23 0434 09/30/23 0951 09/30/23 1641  BP: 103/65 110/72 116/69 133/72  Pulse: 87 89 93 77  Resp: 16 20 20 20   Temp: 98.6 F (37 C) 98.6 F (37 C)  98.2 F (36.8 C)  TempSrc: Oral   Oral  SpO2: 99% 98%  96%  Weight:      Height:       General exam: Alert, awake, oriented x 3; in no major distress.  Reporting feeling weak and experiencing intermittent coughing spells. Respiratory system: No requiring oxygen supplementation.  Positive scattered rhonchi on exam. Cardiovascular system:RRR.  No rubs, no gallops, no JVD. Gastrointestinal system: Abdomen is nondistended, soft and nontender. No organomegaly or masses felt. Normal bowel sounds heard. Central nervous system: Generally weak.  No focal neurological deficits. Extremities: No cyanosis or clubbing. Skin: No petechiae. Psychiatry: Judgement and insight appear normal. Mood & affect appropriate.   Data Reviewed: Magnesium: 1.9 Basic metabolic panel: Sodium 134, potassium 3.2, chloride 97, bicarb 23, BUN 49, creatinine 4.90 and GFR 12. CBC: WBCs 25.6, hemoglobin 10.9 and platelet count 188K  Family Communication: No family at  bedside.  Disposition: Status is: Inpatient Remains inpatient appropriate because: Continue treatment for COVID with his steroids and IV antibiotics for superimposed bacterial pneumonia.   Planned Discharge Destination:  Home with Home Health   Time spent: 50 minutes  Author: Vassie Loll, MD 09/30/2023 5:31 PM  For on call review [email or web address].

## 2023-09-30 NOTE — Progress Notes (Signed)
Pt recently started [facility] HHPT/RN. Clydie Braun with [facility] notified of admission. TOC will follow.    09/30/23 0754  TOC Brief Assessment  Insurance and Status Reviewed  Patient has primary care physician Yes  Home environment has been reviewed Lives with wife.  Prior level of function: Daughter assists.  Prior/Current Home Services Current home services ([facility] PT/RN just started.)  Social Determinants of Health Reivew SDOH reviewed no interventions necessary  Readmission risk has been reviewed Yes  Transition of care needs no transition of care needs at this time

## 2023-09-30 NOTE — Consult Note (Signed)
ESRD Consult Note  Reason for consult: ESRD, provision of dialysis  Assessment/Recommendations:  ESRD  -outpatient HD orders: [facility], MWF. 4 hours. EDW 90kg. TDC (has appt coming up with [facility] on 10/07/23 for perm access consideration). Flow rates: 350/500. 2K, 2.5Ca. Heparin: 1000 units/hr. Meds: Mircera every 4 weeks (last dose 09/15/23), Venofer 50mg  every Monday. -HD today off schedule. Back on MWF schedule tomorrow  COVID -mgmt per primary  Hypokalemia -improved, caution with repletion  Volume/ hypertension  -UF as tolerated  Anemia of Chronic Kidney Disease Hemoglobin 10.9. Avoiding IV Fe for now, due for ESA this week, can resume if Hgb downtrends  -Transfuse PRN for Hgb <7  Secondary Hyperparathyroidism/Hyperphosphatemia - PO4 5.1-acceptable   Recommendations were discussed with the primary team.  Anthony Sar, MD [facility]  History of Present Illness: Philip Benjamin is a/an 72 y.o. male with a past medical history of ESRD, DM2, hypertension, BPH, glaucoma, hyperlipidemia who presents with generalized malaise, nausea, shortness of breath, cough.  Was recently released from skilled nursing facility.  Workup in the ER demonstrated positive COVID-19, dehydration, hypokalemia therefore admitted.  Missed dialysis yesterday. Patient seen and briefly examined this AM. He does report nausea every time he eats. Did not have diarrhea until this AM, had one episode this morning. No other complaints. Denies any recent issues with dialysis as an outpatient or with his catheter.   Medications:  Current Facility-Administered Medications  Medication Dose Route Frequency Provider Last Rate Last Admin   0.9 %  sodium chloride infusion  250 mL Intravenous PRN Vassie Loll, MD   Stopped at 09/29/23 2130   acetaminophen (TYLENOL) tablet 650 mg  650 mg Oral Q6H PRN Vassie Loll, MD       Or   acetaminophen (TYLENOL) suppository 650 mg  650 mg Rectal Q6H PRN  Vassie Loll, MD       ALPRAZolam Prudy Feeler) tablet 0.25 mg  0.25 mg Oral TID PRN Vassie Loll, MD       azithromycin [facility]) tablet 500 mg  500 mg Oral Daily Vassie Loll, MD   500 mg at 09/29/23 2017   calcitRIOL (ROCALTROL) capsule 0.25 mcg  0.25 mcg Oral Daily Vassie Loll, MD   0.25 mcg at 09/29/23 2236   cefTRIAXone (ROCEPHIN) 1 g in sodium chloride 0.9 % 100 mL IVPB  1 g Intravenous Q24H Vassie Loll, MD 200 mL/hr at 09/29/23 2017 1 g at 09/29/23 2017   Chlorhexidine Gluconate Cloth 2 % PADS 6 each  6 each Topical Daily Vassie Loll, MD       chlorpheniramine-HYDROcodone (TUSSIONEX) 10-8 MG/5ML suspension 5 mL  5 mL Oral Q12H PRN Vassie Loll, MD   5 mL at 09/29/23 1630   cycloSPORINE (RESTASIS) 0.05 % ophthalmic emulsion 1 drop  1 drop Both Eyes BID Vassie Loll, MD   1 drop at 09/29/23 2230   dorzolamide-timolol (COSOPT) 2-0.5 % ophthalmic solution 1 drop  1 drop Both Eyes BID Vassie Loll, MD   1 drop at 09/29/23 2232   FLUoxetine (PROZAC) 20 MG/5ML solution 20 mg  20 mg Oral Daily Vassie Loll, MD       folic acid (FOLVITE) tablet 1 mg  1,000 mcg Oral Daily Vassie Loll, MD   1 mg at 09/29/23 2236   heparin injection 5,000 Units  5,000 Units Subcutaneous Myriam Jacobson, MD   5,000 Units at 09/30/23 0504   insulin aspart (novoLOG) injection 0-5 Units  0-5 Units Subcutaneous QHS Vassie Loll, MD  insulin aspart (novoLOG) injection 0-9 Units  0-9 Units Subcutaneous TID WC Vassie Loll, MD       ipratropium-albuterol (DUONEB) 0.5-2.5 (3) MG/3ML nebulizer solution 3 mL  3 mL Nebulization Q6H PRN Vassie Loll, MD       latanoprost (XALATAN) 0.005 % ophthalmic solution 1 drop  1 drop Left Eye Leonia Reader, MD   1 drop at 09/29/23 2232   methylPREDNISolone sodium succinate (SOLU-MEDROL) 40 mg/mL injection 40 mg  40 mg Intravenous Lawerance Cruel, MD   40 mg at 09/30/23 0310   metoprolol tartrate (LOPRESSOR) tablet 12.5 mg  12.5 mg Oral BID Vassie Loll, MD       multivitamin (RENA-VIT) tablet 1 tablet  1 tablet Oral QHS Vassie Loll, MD   1 tablet at 09/29/23 2234   ondansetron (ZOFRAN) tablet 4 mg  4 mg Oral Q6H PRN Vassie Loll, MD       Or   ondansetron [facility]) injection 4 mg  4 mg Intravenous Q6H PRN Vassie Loll, MD   4 mg at 09/29/23 2230   pravastatin (PRAVACHOL) tablet 80 mg  80 mg Oral Daily Vassie Loll, MD   80 mg at 09/29/23 2236   sodium chloride flush (NS) 0.9 % injection 3 mL  3 mL Intravenous Q12H Vassie Loll, MD   3 mL at 09/29/23 2017   sodium chloride flush (NS) 0.9 % injection 3 mL  3 mL Intravenous PRN Vassie Loll, MD       tamsulosin (FLOMAX) capsule 0.4 mg  0.4 mg Oral Q supper Vassie Loll, MD         ALLERGIES Patient has no known allergies.  MEDICAL HISTORY Past Medical History:  Diagnosis Date   BPH (benign prostatic hyperplasia)    Chronic kidney disease    ckd stage 3   Diabetes mellitus without complication (HCC)    Hypertension      SOCIAL HISTORY Social History   Socioeconomic History   Marital status: Married    Spouse name: Not on file   Number of children: Not on file   Years of education: Not on file   Highest education level: Not on file  Occupational History   Not on file  Tobacco Use   Smoking status: Never   Smokeless tobacco: Never  Substance and Sexual Activity   Alcohol use: Never   Drug use: Never   Sexual activity: Not on file  Other Topics Concern   Not on file  Social History Narrative   Not on file   Social Determinants of Health   Financial Resource Strain: Not on file  Food Insecurity: No Food Insecurity (09/29/2023)   Hunger Vital Sign    Worried About Running Out of Food in the Last Year: Never true    Ran Out of Food in the Last Year: Never true  Transportation Needs: No Transportation Needs (09/29/2023)   PRAPARE - Administrator, Civil Service (Medical): No    Lack of Transportation (Non-Medical): No  Physical Activity:  Not on file  Stress: Not on file  Social Connections: Not on file  Intimate Partner Violence: Not At Risk (09/29/2023)   Humiliation, Afraid, Rape, and Kick questionnaire    Fear of Current or Ex-Partner: No    Emotionally Abused: No    Physically Abused: No    Sexually Abused: No     FAMILY HISTORY History reviewed. No pertinent family history.   Review of Systems: 12 systems were reviewed and negative  except per HPI  Physical Exam: Vitals:   09/30/23 0007 09/30/23 0434  BP: 103/65 110/72  Pulse: 87 89  Resp: 16 20  Temp: 98.6 F (37 C) 98.6 F (37 C)  SpO2: 99% 98%   No intake/output data recorded.  Intake/Output Summary (Last 24 hours) at 09/30/2023 0732 Last data filed at 09/30/2023 0500 Gross per 24 hour  Intake 473.99 ml  Output 0 ml  Net 473.99 ml   GEN: NAD CV: RRR RESP: normal wob, unlabored ABD: ND EXT: no edema b/l Les Neuro: awake, alert Dialysis access: Right [facility]  Test Results Reviewed Lab Results  Component Value Date   NA 134 (L) 09/30/2023   K 3.2 (L) 09/30/2023   CL 97 (L) 09/30/2023   CO2 23 09/30/2023   BUN 49 (H) 09/30/2023   CREATININE 4.90 (H) 09/30/2023   CALCIUM 8.6 (L) 09/30/2023   ALBUMIN 2.7 (L) 09/29/2023   PHOS 5.1 (H) 09/29/2023    I have reviewed relevant outside healthcare records

## 2023-09-30 NOTE — Progress Notes (Signed)
   HEMODIALYSIS TREATMENT NOTE:  3.5 hour low-heparin treatment completed using RIJ TDC. Catheter exit site is unremarkable. UF was limited by hypotension.  Morning anti-hypertensives were administered prior to HD orders being written.  500 ml removed.  All blood was returned.   Post-HD:  09/30/23 2300  Vitals  Temp 98.4 F (36.9 C)  Temp Source Oral  BP 121/76  MAP (mmHg) 89  BP Location Right Arm  BP Method Automatic  Patient Position (if appropriate) Lying  Pulse Rate 82  Pulse Rate Source Monitor  Resp 16  Post Treatment  Dialyzer Clearance Lightly streaked  Hemodialysis Intake (mL) 0 mL  Liters Processed 68.3  Fluid Removed (mL) 500 mL  Tolerated HD Treatment No (Comment)  Post-Hemodialysis Comments UF was limited by hypotension  Hemodialysis Catheter Right Subclavian Double lumen Permanent (Tunneled)  Placement Date/Time: 08/15/23 0820   Serial / Lot #: [id number]  Expiration Date: 04/27/28  Time Out: Correct patient;Correct site;Correct procedure  Maximum sterile barrier precautions: Hand hygiene;Cap;Mask;Sterile gown;Sterile gloves;Large sterile ...  Site Condition No complications  Blue Lumen Status Flushed;Heparin locked;Dead end cap in place  Red Lumen Status Flushed;Heparin locked;Dead end cap in place  Purple Lumen Status N/A  Catheter fill solution Heparin 1000 units/ml  Catheter fill volume (Arterial) 1.9 cc  Catheter fill volume (Venous) 1.9  Dressing Type Transparent;Tube stabilization device  Dressing Status Antimicrobial disc in place;Clean, Dry, Intact  Interventions New dressing  Drainage Description None  Dressing Change Due 10/07/23  Post treatment catheter status Capped and Clamped    Arman Filter, RN AP K[facility]

## 2023-09-30 NOTE — Plan of Care (Addendum)
Pt is alert and oriented x 4 Legally blind Renal diet 1800 fluid restriction. Poor po intake due to N/V. Zofran given x 1 for nausea and vomiting. Gingerale and crackers given prior due to med not available due to frequency.  Condon cath in placed. Diminished urine production. Dialysis cath to right chest. Gauze dsg intact. M-W-F dialysis. Pt too weak on Monday dialysis to be completed Tuesday Today.  Lungs diminished. BM 12/10 Edema to BLE.  Problem: Education: Goal: Knowledge of risk factors and measures for prevention of condition will improve Outcome: Progressing   Problem: Coping: Goal: Psychosocial and spiritual needs will be supported Outcome: Progressing   Problem: Respiratory: Goal: Will maintain a patent airway Outcome: Progressing Goal: Complications related to the disease process, condition or treatment will be avoided or minimized Outcome: Progressing   Problem: Education: Goal: Knowledge of General Education information will improve Description: Including pain rating scale, medication(s)/side effects and non-pharmacologic comfort measures Outcome: Progressing   Problem: Health Behavior/Discharge Planning: Goal: Ability to manage health-related needs will improve Outcome: Progressing   Problem: Clinical Measurements: Goal: Ability to maintain clinical measurements within normal limits will improve Outcome: Progressing Goal: Will remain free from infection Outcome: Progressing Goal: Diagnostic test results will improve Outcome: Progressing Goal: Respiratory complications will improve Outcome: Progressing Goal: Cardiovascular complication will be avoided Outcome: Progressing   Problem: Activity: Goal: Risk for activity intolerance will decrease Outcome: Progressing   Problem: Nutrition: Goal: Adequate nutrition will be maintained Outcome: Progressing   Problem: Coping: Goal: Level of anxiety will decrease Outcome: Progressing   Problem:  Elimination: Goal: Will not experience complications related to bowel motility Outcome: Progressing Goal: Will not experience complications related to urinary retention Outcome: Progressing   Problem: Pain Management: Goal: General experience of comfort will improve Outcome: Progressing   Problem: Safety: Goal: Ability to remain free from injury will improve Outcome: Progressing   Problem: Skin Integrity: Goal: Risk for impaired skin integrity will decrease Outcome: Progressing   Problem: Education: Goal: Ability to describe self-care measures that may prevent or decrease complications (Diabetes Survival Skills Education) will improve Outcome: Progressing Goal: Individualized Educational Video(s) Outcome: Progressing   Problem: Coping: Goal: Ability to adjust to condition or change in health will improve Outcome: Progressing   Problem: Fluid Volume: Goal: Ability to maintain a balanced intake and output will improve Outcome: Progressing   Problem: Health Behavior/Discharge Planning: Goal: Ability to identify and utilize available resources and services will improve Outcome: Progressing Goal: Ability to manage health-related needs will improve Outcome: Progressing   Problem: Metabolic: Goal: Ability to maintain appropriate glucose levels will improve Outcome: Progressing   Problem: Nutritional: Goal: Maintenance of adequate nutrition will improve Outcome: Progressing Goal: Progress toward achieving an optimal weight will improve Outcome: Progressing   Problem: Skin Integrity: Goal: Risk for impaired skin integrity will decrease Outcome: Progressing   Problem: Tissue Perfusion: Goal: Adequacy of tissue perfusion will improve Outcome: Progressing

## 2023-10-01 DIAGNOSIS — N186 End stage renal disease: Secondary | ICD-10-CM | POA: Diagnosis not present

## 2023-10-01 DIAGNOSIS — Z992 Dependence on renal dialysis: Secondary | ICD-10-CM | POA: Diagnosis not present

## 2023-10-01 DIAGNOSIS — U071 COVID-19: Secondary | ICD-10-CM | POA: Diagnosis not present

## 2023-10-01 LAB — HEPATITIS B SURFACE ANTIBODY, QUANTITATIVE: Hep B S AB Quant (Post): 73.6 m[IU]/mL

## 2023-10-01 LAB — GLUCOSE, CAPILLARY
Glucose-Capillary: 229 mg/dL — ABNORMAL HIGH (ref 70–99)
Glucose-Capillary: 353 mg/dL — ABNORMAL HIGH (ref 70–99)
Glucose-Capillary: 326 mg/dL — ABNORMAL HIGH (ref 70–99)
Glucose-Capillary: 359 mg/dL — ABNORMAL HIGH (ref 70–99)

## 2023-10-01 LAB — C DIFFICILE QUICK SCREEN W PCR REFLEX
C Diff antigen: NEGATIVE
C Diff interpretation: NOT DETECTED
C Diff toxin: NEGATIVE

## 2023-10-01 MED ORDER — SODIUM CHLORIDE 0.9 % IV SOLN
1.0000 g | INTRAVENOUS | Status: DC
  Administered 2023-10-01 – 2023-10-02 (×2): 1 g via INTRAVENOUS
  Filled 2023-10-01 (×5): qty 10

## 2023-10-01 MED ORDER — PREDNISONE 20 MG PO TABS
50.0000 mg | ORAL_TABLET | Freq: Every day | ORAL | Status: DC
  Administered 2023-10-02: 50 mg via ORAL
  Filled 2023-10-01: qty 3

## 2023-10-01 MED ORDER — LOPERAMIDE HCL 2 MG PO CAPS
2.0000 mg | ORAL_CAPSULE | Freq: Once | ORAL | Status: AC
  Administered 2023-10-01: 2 mg via ORAL
  Filled 2023-10-01: qty 1

## 2023-10-01 NOTE — TOC Initial Note (Signed)
Transition of Care [facility]) - Initial/Assessment Note    Patient Details  Name: Philip Benjamin MRN: [id number] Date of Birth: [birth year]  [facility] of [facility]) CM/SW Contact:    Karn Cassis, LCSW Phone Number: 10/01/2023, 7:50 AM  Clinical Narrative: Pt admitted for COVID infection. Assessment completed due to high risk readmission score. Pt lives with his wife and daughter assists with ADLs as needed. Pt's dialysis is on MWF at [facility]. He is active with [facility] HHPT/RN. Clydie Braun with [facility] aware of admission. TOC will follow.                   Expected Discharge Plan: Home w Home Health Services Barriers to Discharge: Continued Medical Work up   Patient Goals and CMS Choice Patient states their goals for this hospitalization and ongoing recovery are:: return home   Choice offered to / list presented to : Adult Children [facility] ownership interest in [facility].provided to::  (n/a)    Expected Discharge Plan and Services In-house Referral: Clinical Social Work   Post Acute Care Choice: Resumption of Svcs/PTA Provider Living arrangements for the past 2 months: Single Family Home                           HH Arranged: PT, RN HH Agency: [facility] Date [facility] Agency Contacted: 09/30/23 Time HH Agency Contacted: [phone] Representative spoke with at [facility] Agency: Clydie Braun  Prior Living Arrangements/Services Living arrangements for the past 2 months: Single Family Home Lives with:: Spouse Patient language and need for interpreter reviewed:: Yes Do you feel safe going back to the place where you live?: Yes      Need for Family Participation in Patient Care: Yes (Comment) Care giver support system in place?: Yes (comment) Current home services: Home PT, Home RN Criminal Activity/Legal Involvement Pertinent to Current Situation/Hospitalization: No - Comment as needed  Activities of Daily Living   ADL Screening (condition at time of  admission) Independently performs ADLs?: Yes (appropriate for developmental age) Is the patient deaf or have difficulty hearing?: No Does the patient have difficulty seeing, even when wearing glasses/contacts?: No Does the patient have difficulty concentrating, remembering, or making decisions?: No  Permission Sought/Granted                  Emotional Assessment         Alcohol / Substance Use: Not Applicable Psych Involvement: No (comment)  Admission diagnosis:  Weakness [R53.1] ESRD on dialysis (HCC) [N18.6, Z99.2] COVID [U07.1] COVID-19 [U07.1] Patient Active Problem List   Diagnosis Date Noted   COVID-19 09/29/2023   Pseudomonas sepsis (HCC) 08/23/2023   HCAP (healthcare-associated pneumonia) 08/22/2023   Thrombocytopenia (HCC) 08/22/2023   Transaminitis 08/22/2023   Folate deficiency 08/22/2023   Type 2 diabetes mellitus with hyperglycemia (HCC) 08/22/2023   Glaucoma 08/22/2023   Sepsis due to undetermined organism (HCC) 08/22/2023   Cystitis with hematuria 08/22/2023   Urinary tract infection with hematuria 08/22/2023   Sepsis due to urinary tract infection (HCC) 08/21/2023   Malnutrition of moderate degree 08/18/2023   Renal failure 08/14/2023   End-stage renal disease on hemodialysis (HCC) 08/14/2023   Essential hypertension 08/14/2023   T2DM (type 2 diabetes mellitus) (HCC) 08/14/2023   Mixed hyperlipidemia 08/14/2023   BPH (benign prostatic hyperplasia) 08/14/2023   PCP:  Kirstie Peri, MD Pharmacy:   [facility] - [place], [place] - [address]  [place] [address] Phone: [phone] Fax: [phone]  [id number] - [place], [place] - [address] AT [facility] OF [address] [place] [address] Phone: [phone] Fax: [phone]  [facility] [phone] - [place], [place] - [address] AT [facility] [address] [place] [address] Phone:  [phone] Fax: [phone]     Social Determinants of Health (SDOH) Social History: SDOH Screenings   Food Insecurity: No Food Insecurity (09/29/2023)  Housing: Patient Declined (09/29/2023)  Transportation Needs: No Transportation Needs (09/29/2023)  Utilities: Not At Risk (09/29/2023)  Tobacco Use: Low Risk  (09/29/2023)  Health Literacy: Medium Risk (09/05/2023)   Received from [facility]   SDOH Interventions:     Readmission Risk Interventions    10/01/2023    7:48 AM 08/22/2023   12:48 PM 08/15/2023   12:29 PM  Readmission Risk Prevention Plan  Transportation Screening Complete Complete Complete  Home Care Screening  Complete Complete  Medication Review (RN CM)  Complete Complete  HRI or Home Care Consult Complete    Social Work Consult for Recovery Care Planning/Counseling Complete    Palliative Care Screening Not Applicable    Medication Review Oceanographer) Complete

## 2023-10-01 NOTE — Inpatient Diabetes Management (Signed)
Inpatient Diabetes Program Recommendations  AACE/ADA: New Consensus Statement on Inpatient Glycemic Control   Target Ranges:  Prepandial:   less than 140 mg/dL      Peak postprandial:   less than 180 mg/dL (1-2 hours)      Critically ill patients:  140 - 180 mg/dL    Latest Reference Range & Units 10/01/23 07:25 10/01/23 11:10  Glucose-Capillary 70 - 99 mg/dL 161 (H) 096 (H)    Review of Glycemic Control  Diabetes history: DM2 Outpatient Diabetes medications: Lantus 26 units daily Current orders for Inpatient glycemic control: Novolog 0-9 units TID with meals, Novolog 0-5 units at bedtime; Solumedrol 40 mg Q12H  Inpatient Diabetes Program Recommendations:    Insulin: Please consider ordering Semglee 10 units Q24H and ordering Novolog 3 units TID with meals for meal coverage if patient eats at least 50% of meals.  Thanks, Orlando Penner, RN, MSN, CDCES Diabetes Coordinator Inpatient Diabetes Program [phone] (Team Pager from 8am to 5pm)

## 2023-10-01 NOTE — Plan of Care (Signed)
  Problem: Education: Goal: Knowledge of risk factors and measures for prevention of condition will improve Outcome: Progressing   Problem: Respiratory: Goal: Will maintain a patent airway Outcome: Completed/Met Goal: Complications related to the disease process, condition or treatment will be avoided or minimized Outcome: Progressing   Problem: Education: Goal: Knowledge of General Education information will improve Description: Including pain rating scale, medication(s)/side effects and non-pharmacologic comfort measures Outcome: Progressing   Problem: Health Behavior/Discharge Planning: Goal: Ability to manage health-related needs will improve Outcome: Progressing   Problem: Clinical Measurements: Goal: Will remain free from infection Outcome: Progressing

## 2023-10-01 NOTE — Progress Notes (Signed)
Patient ID: Philip Benjamin, male   DOB: 1951/01/30, 72 y.o.   MRN: [id number] S: Not feeling well this morning.  Having constant diarrhea. O:BP 99/64 (BP Location: Right Arm)   Pulse 95   Temp (!) 97.5 F (36.4 C) (Oral)   Resp 16   Ht 6\' 1"  (1.854 m)   Wt 90.4 kg   SpO2 95%   BMI 26.29 kg/m   Intake/Output Summary (Last 24 hours) at 10/01/2023 1234 Last data filed at 10/01/2023 0900 Gross per 24 hour  Intake 580 ml  Output 500 ml  Net 80 ml   Intake/Output: I/O last 3 completed shifts: In: 714 [P.O.:610; I.V.:4; IV Piggyback:100] Out: 500 [Other:500]  Intake/Output this shift:  Total I/O In: 340 [P.O.:240; IV Piggyback:100] Out: -  Weight change: 0.181 kg Gen: NAD CVS: RRR Resp: CTA Abd: +BS, soft, NT/ND Ext: no edema  Recent Labs  Lab 09/29/23 0928 09/30/23 0425  NA 134* 134*  K 2.8* 3.2*  CL 96* 97*  CO2 24 23  GLUCOSE 116* 142*  BUN 37* 49*  CREATININE 4.51* 4.90*  ALBUMIN 2.7*  --   CALCIUM 8.6* 8.6*  PHOS 5.1*  --   AST 37  --   ALT 28  --    Liver Function Tests: Recent Labs  Lab 09/29/23 0928  AST 37  ALT 28  ALKPHOS 105  BILITOT 1.0  PROT 6.4*  ALBUMIN 2.7*   No results for input(s): "LIPASE", "AMYLASE" in the last 168 hours. No results for input(s): "AMMONIA" in the last 168 hours. CBC: Recent Labs  Lab 09/29/23 0928 09/30/23 0425  WBC 29.9* 25.6*  NEUTROABS 26.9*  --   HGB 10.9* 10.9*  HCT 33.6* 33.0*  MCV 91.8 89.7  PLT 161 188   Cardiac Enzymes: No results for input(s): "CKTOTAL", "CKMB", "CKMBINDEX", "TROPONINI" in the last 168 hours. CBG: Recent Labs  Lab 09/30/23 1150 09/30/23 1644 09/30/23 2313 10/01/23 0725 10/01/23 1110  GLUCAP 271* 257* 173* 229* 353*    Iron Studies:  Recent Labs    09/29/23 1601  FERRITIN 665*   Studies/Results: No results found.  azithromycin  500 mg Oral Daily   calcitRIOL  0.25 mcg Oral Daily   Chlorhexidine Gluconate Cloth  6 each Topical Daily   Chlorhexidine Gluconate  Cloth  6 each Topical Q0600   cycloSPORINE  1 drop Both Eyes BID   dorzolamide-timolol  1 drop Both Eyes BID   FLUoxetine  20 mg Oral QHS   folic acid  1,000 mcg Oral Daily   heparin  5,000 Units Subcutaneous Q8H   insulin aspart  0-5 Units Subcutaneous QHS   insulin aspart  0-9 Units Subcutaneous TID WC   latanoprost  1 drop Left Eye QHS   methylPREDNISolone (SOLU-MEDROL) injection  40 mg Intravenous Q12H   metoprolol tartrate  12.5 mg Oral BID   multivitamin  1 tablet Oral QHS   pravastatin  80 mg Oral Daily   sodium chloride flush  3 mL Intravenous Q12H   tamsulosin  0.4 mg Oral Q supper    BMET    Component Value Date/Time   NA 134 (L) 09/30/2023 0425   K 3.2 (L) 09/30/2023 0425   CL 97 (L) 09/30/2023 0425   CO2 23 09/30/2023 0425   GLUCOSE 142 (H) 09/30/2023 0425   BUN 49 (H) 09/30/2023 0425   CREATININE 4.90 (H) 09/30/2023 0425   CALCIUM 8.6 (L) 09/30/2023 0425   GFRNONAA 12 (L) 09/30/2023 0425   GFRAA  44 (L) 01/14/2018 1112   CBC    Component Value Date/Time   WBC 25.6 (H) 09/30/2023 0425   RBC 3.68 (L) 09/30/2023 0425   HGB 10.9 (L) 09/30/2023 0425   HCT 33.0 (L) 09/30/2023 0425   PLT 188 09/30/2023 0425   MCV 89.7 09/30/2023 0425   MCH 29.6 09/30/2023 0425   MCHC 33.0 09/30/2023 0425   RDW 12.7 09/30/2023 0425   LYMPHSABS 0.7 09/29/2023 0928   MONOABS 2.0 (H) 09/29/2023 0928   EOSABS 0.0 09/29/2023 0928   BASOSABS 0.1 09/29/2023 0928   outpatient HD orders: [facility], MWF. 4 hours. EDW 90kg. TDC (has appt coming up with [facility] on 10/07/23 for perm access consideration). Flow rates: 350/500. 2K, 2.5Ca. Heparin: 1000 units/hr. Meds: Mircera every 4 weeks (last dose 09/15/23), Venofer 50mg  every Monday.  Assessment/Plan:  Covid-19 - per primary Diarrhea - will need stool studies and immodium per primary svc ESRD - pt does not want HD today due to ongoing diarrhea and low bp.  Will plan for HD tomorrow and eventually get back on MWF schedule.  He had  HD last night so ok to wait. HTN/volume - low bp, no edema.  Only 0.4 above edw.   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  Phos stable.   Irena Cords, MD [employer] [phone]

## 2023-10-01 NOTE — Progress Notes (Addendum)
PROGRESS NOTE  Philip Benjamin:[id number] DOB: 1951-01-20 DOA: 09/29/2023 PCP: Kirstie Peri, MD  Brief History:  72 year old male with a history of hypertension, diabetes mellitus, hyperlipidemia, newly diagnosed ESRD, and anemia of CKD  The patient was recently hospitalized from 08/14/2023-08/20/2023 where he was declared new ESRD and initiated on dialysis. TDC was placed on 08/15/2023, and the patient was initiated on HD.    secondary to general malaise, nausea and weakness; patient has also expressed intermittent nonproductive coughing spells and mild shortness of breath.  Patient denies chills, fever, overt bleeding, focal weaknesses, headache or any other complaints.   Of note, patient has been released from the skilled nursing facility about 1 week prior to this hospitalization.   Workup in the ED demonstrated positive COVID-19 infection, mild dehydration and hypokalemia.   TRH has been consulted to place in the hospital for further evaluation and management.  The patient also had a recent hospital admission from 08/21/2023 to 08/25/2023 when he was treated for sepsis secondary to Pseudomonas UTI and lobar pneumonia.  He was treated with IV cefepime, and discharged with Cipro for 2 additional doses.   Assessment/Plan: COVID 19 Pneumonia -stable on RA -As present intermittent nonproductive coughing spells and just mild short winded sensation with activity. -Continue treatment with supportive care, bronchodilators and steroids -Continue to follow intermittently inflammatory markers. -Patient was started on steroids a the time of admission   pneumonia -Patient with elevated leukocytosis and also elevated procalcitonin -PCT 2.05 -Continue ceftriaxone and azithro -Continue mucolytic's, flutter valve and bronchodilator as mentioned above   ESRD -Appreciate nephrology -last HD 09/30/23 -usually on MWF  Pyuria -sent urine culture 12/11 -start empiric cefepime as  pt had pseudomonas in urine last hospitalization  Diabetes mellitus type 2 -08/14/2023 hemoglobin A1c 5.6 -Continue NovoLog sliding scale -Continue reduced dose Semglee during hospitalization   hypokalemia -Repleted -Continue to follow electrolytes as they will be further stabilized with dialysis.   hypertension/hyperlipidemia -Continue statins and metoprolol   BPH -Continue Flomax.   increased intraocular pressure/glaucoma -Continue the use of latanoprost and also dorzolamide/timolol. -Continue outpatient follow-up with ophthalmology service.   depression/anxiety -Continue as needed Xanax and the use of Prozac.   generalized weakness -Most likely in the setting of pneumonia/COVID -PT evaluation will be requested.   anemia of chronic kidney disease -Stable hemoglobin - Hgb stable in 9 range, no bleeding. On ESA per nephrology   Diarrhea -c diff neg -check stool pathogen panel -prn loperamide     Family Communication:  no Family at bedside  Consultants:  renal  Code Status:  FULL   DVT Prophylaxis:  [place] Heparin    Procedures: As Listed in Progress Note Above  Antibiotics: Azithro 12/9>> Cefepime 12/11>> Ceftriaxone 12/9>>12/10        Subjective: Patient complains of generalized weakness.  He has a nonproductive cough.  There is no hemoptysis.  He denies any vomiting.  He has had diarrhea today.  There is no hematochezia or melena.  Denies abdominal pain.  Objective: Vitals:   09/30/23 2313 10/01/23 0516 10/01/23 0851 10/01/23 1002  BP: 128/76 129/77 127/82 99/64  Pulse: 84 82 85 95  Resp: 18 18 16    Temp: 98.7 F (37.1 C) 98.4 F (36.9 C) 98.2 F (36.8 C) (!) 97.5 F (36.4 C)  TempSrc: Oral Oral Oral Oral  SpO2: 99% 99% 98% 95%  Weight:      Height:  Intake/Output Summary (Last 24 hours) at 10/01/2023 1821 Last data filed at 10/01/2023 1644 Gross per 24 hour  Intake 580 ml  Output 500 ml  Net 80 ml   Weight change: 0.181  kg Exam:  General:  Pt is alert, follows commands appropriately, not in acute distress HEENT: No icterus, No thrush, No neck mass, [place]/AT Cardiovascular: RRR, S1/S2, no rubs, no gallops Respiratory: Bibasalar rales.  No wheezing Abdomen: Soft/+BS, non tender, non distended, no guarding Extremities: No edema, No lymphangitis, No petechiae, No rashes, no synovitis   Data Reviewed: I have personally reviewed following labs and imaging studies Basic Metabolic Panel: Recent Labs  Lab 09/29/23 0928 09/30/23 0425  NA 134* 134*  K 2.8* 3.2*  CL 96* 97*  CO2 24 23  GLUCOSE 116* 142*  BUN 37* 49*  CREATININE 4.51* 4.90*  CALCIUM 8.6* 8.6*  MG 1.8  1.8 1.9  PHOS 5.1*  --    Liver Function Tests: Recent Labs  Lab 09/29/23 0928  AST 37  ALT 28  ALKPHOS 105  BILITOT 1.0  PROT 6.4*  ALBUMIN 2.7*   No results for input(s): "LIPASE", "AMYLASE" in the last 168 hours. No results for input(s): "AMMONIA" in the last 168 hours. Coagulation Profile: No results for input(s): "INR", "PROTIME" in the last 168 hours. CBC: Recent Labs  Lab 09/29/23 0928 09/30/23 0425  WBC 29.9* 25.6*  NEUTROABS 26.9*  --   HGB 10.9* 10.9*  HCT 33.6* 33.0*  MCV 91.8 89.7  PLT 161 188   Cardiac Enzymes: No results for input(s): "CKTOTAL", "CKMB", "CKMBINDEX", "TROPONINI" in the last 168 hours. BNP: Invalid input(s): "POCBNP" CBG: Recent Labs  Lab 09/30/23 1644 09/30/23 2313 10/01/23 0725 10/01/23 1110 10/01/23 1637  GLUCAP 257* 173* 229* 353* 326*   HbA1C: No results for input(s): "HGBA1C" in the last 72 hours. Urine analysis:    Component Value Date/Time   COLORURINE AMBER (A) 09/29/2023 1524   APPEARANCEUR CLOUDY (A) 09/29/2023 1524   LABSPEC 1.017 09/29/2023 1524   PHURINE 5.0 09/29/2023 1524   GLUCOSEU NEGATIVE 09/29/2023 1524   HGBUR SMALL (A) 09/29/2023 1524   BILIRUBINUR NEGATIVE 09/29/2023 1524   KETONESUR NEGATIVE 09/29/2023 1524   PROTEINUR 100 (A) 09/29/2023 1524    NITRITE NEGATIVE 09/29/2023 1524   LEUKOCYTESUR MODERATE (A) 09/29/2023 1524   Sepsis Labs: @LABRCNTIP (procalcitonin:4,lacticidven:4) ) Recent Results (from the past 240 hour(s))  Resp panel by RT-PCR (RSV, Flu A&B, Covid) Anterior Nasal Swab     Status: Abnormal   Collection Time: 09/29/23 10:05 AM   Specimen: Anterior Nasal Swab  Result Value Ref Range Status   SARS Coronavirus 2 by RT PCR POSITIVE (A) NEGATIVE Final    Comment: (NOTE) SARS-CoV-2 target nucleic acids are DETECTED.  The SARS-CoV-2 RNA is generally detectable in upper respiratory specimens during the acute phase of infection. Positive results are indicative of the presence of the identified virus, but do not rule out bacterial infection or co-infection with other pathogens not detected by the test. Clinical correlation with patient history and other diagnostic information is necessary to determine patient infection status. The expected result is Negative.  Fact Sheet for Patients: BloggerCourse.com  Fact Sheet for Healthcare Providers: SeriousBroker.it  This test is not yet approved or cleared by the [place] FDA and  has been authorized for detection and/or diagnosis of SARS-CoV-2 by FDA under an Emergency Use Authorization (EUA).  This EUA will remain in effect (meaning this test can be used) for the duration of  the  COVID-19 declaration under Section 564(b)(1) of the A ct, 21 U.S.C. section 360bbb-3(b)(1), unless the authorization is terminated or revoked sooner.     Influenza A by PCR NEGATIVE NEGATIVE Final   Influenza B by PCR NEGATIVE NEGATIVE Final    Comment: (NOTE) The Xpert Xpress SARS-CoV-2/FLU/RSV plus assay is intended as an aid in the diagnosis of influenza from Nasopharyngeal swab specimens and should not be used as a sole basis for treatment. Nasal washings and aspirates are unacceptable for Xpert Xpress  SARS-CoV-2/FLU/RSV testing.  Fact Sheet for Patients: BloggerCourse.com  Fact Sheet for Healthcare Providers: SeriousBroker.it  This test is not yet approved or cleared by the [place] FDA and has been authorized for detection and/or diagnosis of SARS-CoV-2 by FDA under an Emergency Use Authorization (EUA). This EUA will remain in effect (meaning this test can be used) for the duration of the COVID-19 declaration under Section 564(b)(1) of the Act, 21 U.S.C. section 360bbb-3(b)(1), unless the authorization is terminated or revoked.     Resp Syncytial Virus by PCR NEGATIVE NEGATIVE Final    Comment: (NOTE) Fact Sheet for Patients: BloggerCourse.com  Fact Sheet for Healthcare Providers: SeriousBroker.it  This test is not yet approved or cleared by the [place] FDA and has been authorized for detection and/or diagnosis of SARS-CoV-2 by FDA under an Emergency Use Authorization (EUA). This EUA will remain in effect (meaning this test can be used) for the duration of the COVID-19 declaration under Section 564(b)(1) of the Act, 21 U.S.C. section 360bbb-3(b)(1), unless the authorization is terminated or revoked.  Performed at [facility], [address], [place], [place] [address]   C Difficile Quick Screen w PCR reflex     Status: None   Collection Time: 10/01/23 11:15 AM   Specimen: STOOL  Result Value Ref Range Status   C Diff antigen NEGATIVE NEGATIVE Final   C Diff toxin NEGATIVE NEGATIVE Final   C Diff interpretation No C. difficile detected.  Final    Comment: Performed at [facility], [address] [place], [place] [address]     Scheduled Meds:  azithromycin  500 mg Oral Daily   calcitRIOL  0.25 mcg Oral Daily   Chlorhexidine Gluconate Cloth  6 each Topical Daily   Chlorhexidine Gluconate Cloth  6 each Topical Q0600   cycloSPORINE  1 drop Both Eyes BID    dorzolamide-timolol  1 drop Both Eyes BID   FLUoxetine  20 mg Oral QHS   folic acid  1,000 mcg Oral Daily   heparin  5,000 Units Subcutaneous Q8H   insulin aspart  0-5 Units Subcutaneous QHS   insulin aspart  0-9 Units Subcutaneous TID WC   latanoprost  1 drop Left Eye QHS   methylPREDNISolone (SOLU-MEDROL) injection  40 mg Intravenous Q12H   metoprolol tartrate  12.5 mg Oral BID   multivitamin  1 tablet Oral QHS   pravastatin  80 mg Oral Daily   sodium chloride flush  3 mL Intravenous Q12H   tamsulosin  0.4 mg Oral Q supper   Continuous Infusions:  cefTRIAXone (ROCEPHIN)  IV 1 g (09/30/23 2030)    Procedures/Studies: DG Chest 2 View  Result Date: 09/29/2023 CLINICAL DATA:  Weakness, nausea. EXAM: CHEST - 2 VIEW COMPARISON:  Same day. FINDINGS: Stable cardiomediastinal silhouette. Right internal jugular dialysis catheter is unchanged. Hypoinflation of the lungs is noted with probable bibasilar subsegmental atelectasis and possible small pleural effusions. Bony thorax is unremarkable. IMPRESSION: Hypoinflation of lungs with probable bibasilar subsegmental atelectasis and  possible small pleural effusions. Electronically Signed   By: Lupita Raider M.D.   On: 09/29/2023 12:53   DG Chest Portable 1 View  Result Date: 09/29/2023 CLINICAL DATA:  ?Pna Pt complains of generalized weakness, nausea, and loss of appetite x 1 week. Pt states he was discharged from SNF around a week ago EXAM: PORTABLE CHEST 1 VIEW COMPARISON:  Chest x-ray 08/14/2023 FINDINGS: Right chest wall dialysis catheter with tip overlying the right atrium. The heart and mediastinal contours are within normal limits. Low lung volumes. Bibasilar airspace opacities. No pulmonary edema. Likely trace bilateral pleural effusion. No pneumothorax. No acute osseous abnormality. IMPRESSION: 1. Low lung volumes with bibasilar airspace opacities that could represent a combination of atelectasis and/or infection/inflammation. Recommend repeat  PA and lateral view with improved inspiratory effort for further evaluation. 2. Likely trace bilateral pleural effusions. Electronically Signed   By: Tish Frederickson M.D.   On: 09/29/2023 09:55    Catarina Hartshorn, DO  [facility]  If 7PM-7AM, please contact night-coverage [email or web address] Password [email or web address] 10/01/2023, 6:21 PM   LOS: 1 day

## 2023-10-01 NOTE — Hospital Course (Addendum)
72 year old male with a history of hypertension, diabetes mellitus, hyperlipidemia, newly diagnosed ESRD, and anemia of CKD  The patient was recently hospitalized from 08/14/2023-08/20/2023 where he was declared new ESRD and initiated on dialysis. TDC was placed on 08/15/2023, and the patient was initiated on HD.    Pt is admitted secondary to general malaise, nausea and weakness; patient has also expressed intermittent nonproductive coughing spells and mild shortness of breath.  Patient denies chills, fever, overt bleeding, focal weaknesses, headache or any other complaints.   Of note, patient has been released from the skilled nursing facility about 1 week prior to this hospitalization.   Workup in the ED demonstrated positive COVID-19 infection, mild dehydration and hypokalemia.   TRH has been consulted to place in the hospital for further evaluation and management.  The patient also had a recent hospital admission from 08/21/2023 to 08/25/2023 when he was treated for sepsis secondary to Pseudomonas UTI and lobar pneumonia.  He was treated with IV cefepime, and discharged with Cipro for 2 additional doses.  The patient was placed on intravenous steroids for his COVID 19 infection.  His PCT was 2.05.  As result, the patient was initially started on ceftriaxone and azithromycin for presumptive superimposed pneumonia.  He gradually improved, but WBC increased.  UA suggested significant pyuria with WBC>50.  His ceftriaxone was switched to cefepime given his previous Pseudomonas in the urine.  The patient remained afebrile and hemodynamically stable.  Unfortunately, he began developing right upper quadrant abdominal pain with increase in WBC despite being on antibiotics.  CT of the abdomen and pelvis was performed and showed distended gallbladder with pericholecystic stranding and loculated pericholecystic fluid.  There was concern for pericholecystic abscess.  General surgery was consulted.  IR was also  subsequently consulted for percutaneous cholecystotomy tube and drainage of the abscess.

## 2023-10-02 DIAGNOSIS — U071 COVID-19: Secondary | ICD-10-CM | POA: Diagnosis not present

## 2023-10-02 DIAGNOSIS — R531 Weakness: Secondary | ICD-10-CM

## 2023-10-02 DIAGNOSIS — N186 End stage renal disease: Secondary | ICD-10-CM | POA: Diagnosis not present

## 2023-10-02 DIAGNOSIS — Z992 Dependence on renal dialysis: Secondary | ICD-10-CM | POA: Diagnosis not present

## 2023-10-02 LAB — CBC
HCT: 28.2 % — ABNORMAL LOW (ref 39.0–52.0)
Hemoglobin: 9 g/dL — ABNORMAL LOW (ref 13.0–17.0)
MCH: 28.6 pg (ref 26.0–34.0)
MCHC: 31.9 g/dL (ref 30.0–36.0)
MCV: 89.5 fL (ref 80.0–100.0)
Platelets: 199 10*3/uL (ref 150–400)
RBC: 3.15 MIL/uL — ABNORMAL LOW (ref 4.22–5.81)
RDW: 12.7 % (ref 11.5–15.5)
WBC: 16.1 10*3/uL — ABNORMAL HIGH (ref 4.0–10.5)
nRBC: 0 % (ref 0.0–0.2)

## 2023-10-02 LAB — FERRITIN: Ferritin: 664 ng/mL — ABNORMAL HIGH (ref 24–336)

## 2023-10-02 LAB — COMPREHENSIVE METABOLIC PANEL
ALT: 32 U/L (ref 0–44)
AST: 33 U/L (ref 15–41)
Albumin: 2.3 g/dL — ABNORMAL LOW (ref 3.5–5.0)
Alkaline Phosphatase: 107 U/L (ref 38–126)
Anion gap: 13 (ref 5–15)
BUN: 67 mg/dL — ABNORMAL HIGH (ref 8–23)
CO2: 23 mmol/L (ref 22–32)
Calcium: 8.4 mg/dL — ABNORMAL LOW (ref 8.9–10.3)
Chloride: 94 mmol/L — ABNORMAL LOW (ref 98–111)
Creatinine, Ser: 4.77 mg/dL — ABNORMAL HIGH (ref 0.61–1.24)
GFR, Estimated: 12 mL/min — ABNORMAL LOW (ref >60)
Glucose, Bld: 321 mg/dL — ABNORMAL HIGH (ref 70–99)
Potassium: 3.5 mmol/L (ref 3.5–5.1)
Sodium: 130 mmol/L — ABNORMAL LOW (ref 135–145)
Total Bilirubin: 0.5 mg/dL (ref <1.2)
Total Protein: 5.8 g/dL — ABNORMAL LOW (ref 6.5–8.1)

## 2023-10-02 LAB — GLUCOSE, CAPILLARY
Glucose-Capillary: 327 mg/dL — ABNORMAL HIGH (ref 70–99)
Glucose-Capillary: 412 mg/dL — ABNORMAL HIGH (ref 70–99)
Glucose-Capillary: 389 mg/dL — ABNORMAL HIGH (ref 70–99)
Glucose-Capillary: 127 mg/dL — ABNORMAL HIGH (ref 70–99)

## 2023-10-02 LAB — URINE CULTURE

## 2023-10-02 LAB — C-REACTIVE PROTEIN: CRP: 9.6 mg/dL — ABNORMAL HIGH (ref <1.0)

## 2023-10-02 MED ORDER — HEPARIN SODIUM (PORCINE) 1000 UNIT/ML DIALYSIS: 2000.0000 [IU] | Freq: Once | INTRAMUSCULAR | Status: DC

## 2023-10-02 MED ORDER — INSULIN ASPART 100 UNIT/ML IJ SOLN
15.0000 [IU] | Freq: Once | INTRAMUSCULAR | Status: AC
  Administered 2023-10-02: 15 [IU] via SUBCUTANEOUS

## 2023-10-02 MED ORDER — PHENOL 1.4 % MT LIQD
1.0000 | OROMUCOSAL | Status: DC | PRN
  Administered 2023-10-02: 1 via OROMUCOSAL
  Filled 2023-10-02: qty 177

## 2023-10-02 MED ORDER — INSULIN ASPART 100 UNIT/ML IJ SOLN: 3.0000 [IU] | Freq: Three times a day (TID) | INTRAMUSCULAR | Status: DC

## 2023-10-02 MED ORDER — MORPHINE SULFATE (PF) 2 MG/ML IV SOLN
2.0000 mg | Freq: Once | INTRAVENOUS | Status: AC | PRN
  Administered 2023-10-02: 2 mg via INTRAVENOUS
  Filled 2023-10-02: qty 1

## 2023-10-02 MED ORDER — INSULIN ASPART 100 UNIT/ML IJ SOLN
4.0000 [IU] | Freq: Three times a day (TID) | INTRAMUSCULAR | Status: DC
  Administered 2023-10-02 – 2023-10-04 (×5): 4 [IU] via SUBCUTANEOUS

## 2023-10-02 MED ORDER — HEPARIN SODIUM (PORCINE) 1000 UNIT/ML IJ SOLN
INTRAMUSCULAR | Status: AC
  Filled 2023-10-02: qty 6

## 2023-10-02 MED ORDER — LOPERAMIDE HCL 2 MG PO CAPS
4.0000 mg | ORAL_CAPSULE | Freq: Once | ORAL | Status: AC
  Administered 2023-10-02: 2 mg via ORAL
  Filled 2023-10-02: qty 2

## 2023-10-02 NOTE — Plan of Care (Signed)
  Problem: Education: Goal: Knowledge of risk factors and measures for prevention of condition will improve Outcome: Progressing   Problem: Coping: Goal: Psychosocial and spiritual needs will be supported Outcome: Progressing   Problem: Health Behavior/Discharge Planning: Goal: Ability to manage health-related needs will improve Outcome: Progressing   Problem: Clinical Measurements: Goal: Ability to maintain clinical measurements within normal limits will improve Outcome: Progressing

## 2023-10-02 NOTE — Plan of Care (Signed)
  Problem: Respiratory: Goal: Complications related to the disease process, condition or treatment will be avoided or minimized Outcome: Progressing   Problem: Education: Goal: Knowledge of General Education information will improve Description: Including pain rating scale, medication(s)/side effects and non-pharmacologic comfort measures Outcome: Progressing

## 2023-10-02 NOTE — Evaluation (Signed)
Physical Therapy Evaluation Patient Details Name: Philip Benjamin MRN: [id number] DOB: [birth year] Today's Date: 10/02/2023  History of Present Illness  72 year old male with a history of hypertension, diabetes mellitus, hyperlipidemia, newly diagnosed ESRD, and anemia of CKD     The patient was recently hospitalized from 08/14/2023-08/20/2023 where he was declared new ESRD and initiated on dialysis. TDC was placed on 08/15/2023, and the patient was initiated on HD.       secondary to general malaise, nausea and weakness; patient has also expressed intermittent nonproductive coughing spells and mild shortness of breath.  Patient denies chills, fever, overt bleeding, focal weaknesses, headache or any other complaints.     Of note, patient has been released from the skilled nursing facility about 1 week prior to this hospitalization.     Workup in the ED demonstrated positive COVID-19 infection, mild dehydration and hypokalemia.  Clinical Impression  Pt admitted to hospital 10/24-10/30 with new dx ESRD.  Went to SNF for one day returned and readmitted from 10/31-11/4 at which time pt went back to SNF until approximately 12/2 when he went home.  Returned to ER on 12/7.  Pt eager to go home he does not want to go back to SNF.         If plan is discharge home, recommend the following: A little help with bathing/dressing/bathroom;A little help with walking and/or transfers;Assistance with cooking/housework;Assist for transportation;Help with stairs or ramp for entrance   Can travel by private vehicle    yes    Equipment Recommendations None recommended by PT  Recommendations for Other Services       Functional Status Assessment Patient has had a recent decline in their functional status and demonstrates the ability to make significant improvements in function in a reasonable and predictable amount of time.     Precautions / Restrictions Precautions Precautions: Fall Restrictions Weight  Bearing Restrictions Per Provider Order: No      Mobility  Bed Mobility Overal bed mobility: Modified Independent                  Transfers Overall transfer level: Modified independent Equipment used: Rolling walker (2 wheels)                    Ambulation/Gait Ambulation/Gait assistance: Modified independent (Device/Increase time) Gait Distance (Feet): 28 Feet Assistive device: Rolling walker (2 wheels) Gait Pattern/deviations: Decreased step length - right, Decreased step length - left   Gait velocity interpretation: <1.31 ft/sec, indicative of household ambulator            Pertinent Vitals/Pain Pain Assessment Pain Assessment: No/denies pain    Home Living Family/patient expects to be discharged to:: Private residence Living Arrangements: Spouse/significant other;Children Available Help at Discharge: Family;Available PRN/intermittently Type of Home: House Home Access: Stairs to enter   Entrance Stairs-Number of Steps: 5 Alternate Level Stairs-Number of Steps: 5 Home Layout: Two level;Laundry or work area in basement;Able to live on main level with bedroom/bathroom;Full bath on main level Home Equipment: Rolling Walker (2 wheels);Shower seat;Hand held shower head      Prior Function Prior Level of Function : Independent/Modified Independent             Mobility Comments: Household and short distanced community ambulation without AD ADLs Comments: Independent     Extremity/Trunk Assessment        Lower Extremity Assessment Lower Extremity Assessment: Generalized weakness       Communication   Communication Communication: No apparent difficulties  Cueing Techniques: Verbal cues  Cognition Arousal: Alert Behavior During Therapy: WFL for tasks assessed/performed Overall Cognitive Status: Within Functional Limits for tasks assessed                                                 Assessment/Plan    PT  Assessment Patient needs continued PT services  PT Problem List Decreased strength;Decreased activity tolerance;Decreased balance;Decreased mobility       PT Treatment Interventions Therapeutic activities;Functional mobility training;Gait training;Therapeutic exercise    PT Goals (Current goals can be found in the Care Plan section)  Acute Rehab PT Goals Patient Stated Goal: To go home PT Goal Formulation: With patient Time For Goal Achievement: 10/05/23 Potential to Achieve Goals: Good    Frequency Min 3X/week        AM-PAC PT "6 Clicks" Mobility  Outcome Measure Help needed turning from your back to your side while in a flat bed without using bedrails?: A Little Help needed moving from lying on your back to sitting on the side of a flat bed without using bedrails?: A Little Help needed moving to and from a bed to a chair (including a wheelchair)?: A Little Help needed standing up from a chair using your arms (e.g., wheelchair or bedside chair)?: A Little Help needed to walk in hospital room?: A Little Help needed climbing 3-5 steps with a railing? : A Lot 6 Click Score: 17    End of Session Equipment Utilized During Treatment: Gait belt Activity Tolerance: Patient limited by fatigue Patient left: in bed;with call bell/phone within reach   PT Visit Diagnosis: Unsteadiness on feet (R26.81);Muscle weakness (generalized) (M62.81)    Time: 2130-8657 PT Time Calculation (min) (ACUTE ONLY): 28 min   Charges:   PT Evaluation $PT Eval Low Complexity: 1 Low PT Treatments $Gait Training: 8-22 mins PT General Charges $$ ACUTE PT VISIT: 1 Visit        Virgina Organ, PT CLT [phone]  10/02/2023, 3:56 PM

## 2023-10-02 NOTE — Progress Notes (Signed)
   HEMODIALYSIS TREATMENT NOTE:  3.5 hour treatment completed using RIJ TDC.  Goal met: 1 liter removed without interruption in UF.  All blood was returned.  postHD:  10/02/23 2230  Vitals  BP 128/81  MAP (mmHg) 98  BP Location Right Arm  BP Method Automatic  Patient Position (if appropriate) Lying  Pulse Rate 68  Pulse Rate Source Monitor  Resp 16  Oxygen Therapy  SpO2 99 %  O2 Device Room Air  Post Treatment  Dialyzer Clearance Lightly streaked  Hemodialysis Intake (mL) 0 mL  Liters Processed 73.5  Fluid Removed (mL) 1000 mL  Tolerated HD Treatment Yes  Post-Hemodialysis Comments Goal met  Hemodialysis Catheter Right Subclavian Double lumen Permanent (Tunneled)  Placement Date/Time: 08/15/23 0820   Serial / Lot #: [id number]  Expiration Date: 04/27/28  Time Out: Correct patient;Correct site;Correct procedure  Maximum sterile barrier precautions: Hand hygiene;Cap;Mask;Sterile gown;Sterile gloves;Large sterile ...  Site Condition No complications  Blue Lumen Status Flushed;Heparin locked;Dead end cap in place  Red Lumen Status Flushed;Heparin locked;Dead end cap in place  Purple Lumen Status N/A  Catheter fill solution Heparin 1000 units/ml  Catheter fill volume (Arterial) 1.9 cc  Catheter fill volume (Venous) 1.9  Dressing Type Transparent;Tube stabilization device  Dressing Status Antimicrobial disc in place;Clean, Dry, Intact  Interventions Dressing reinforced  Drainage Description None  Dressing Change Due 10/07/23  Post treatment catheter status Capped and Clamped    Arman Filter, RN AP [facility]

## 2023-10-02 NOTE — Inpatient Diabetes Management (Signed)
Inpatient Diabetes Program Recommendations  AACE/ADA: New Consensus Statement on Inpatient Glycemic Control   Target Ranges:  Prepandial:   less than 140 mg/dL      Peak postprandial:   less than 180 mg/dL (1-2 hours)      Critically ill patients:  140 - 180 mg/dL    Latest Reference Range & Units 10/02/23 04:47  Glucose 70 - 99 mg/dL 409 (H)    Latest Reference Range & Units 10/01/23 07:25 10/01/23 11:10 10/01/23 16:37 10/01/23 21:32  Glucose-Capillary 70 - 99 mg/dL 811 (H) 914 (H) 782 (H) 359 (H)   Review of Glycemic Control  Diabetes history: DM2 Outpatient Diabetes medications: Lantus 26 units daily Current orders for Inpatient glycemic control: Novolog 0-9 units TID with meals, Novolog 0-5 units at bedtime; Prednisone 50 mg QAM   Inpatient Diabetes Program Recommendations:     Insulin: Please consider ordering Semglee 10 units Q24H and ordering Novolog 3 units TID with meals for meal coverage if patient eats at least 50% of meals.  NOTE: Note steroids changed from Solumedrol to Prednisone. Lab glucose 321 mg/dl today.   Thanks, Orlando Penner, RN, MSN, CDCES Diabetes Coordinator Inpatient Diabetes Program [phone] (Team Pager from 8am to 5pm)

## 2023-10-02 NOTE — Progress Notes (Signed)
Patient ID: Philip Benjamin, male   DOB: 12-Aug-1951, 72 y.o.   MRN: [id number]  Outpatient HD orders: [facility], MWF. 4 hours. EDW 90kg. TDC (has appt coming up with [facility] on 10/07/23 for perm access consideration). Flow rates: 350/500. 2K, 2.5Ca. Heparin: 1000 units/hr. Meds: Mircera every 4 weeks (last dose 09/15/23), Venofer 50mg  every Monday.  Assessment/Plan:  Covid-19 - per primary Diarrhea - will need stool studies and immodium per primary svc ESRD - pt did not want HD Wed due to ongoing diarrhea and low bp.  Scheduled for HD today; and will plan on Friday as well to get back on MWF schedule.   HTN/volume - low bp, no edema.  Only 0.4 above edw.   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  Phos stable.   S: No BM today and cough seems to be better; Pt denies fever, chills, nausea, vomiting, myalgias, SOB, CP.   O:BP (!) 172/84 (BP Location: Right Arm)   Pulse 60   Temp 98.3 F (36.8 C) (Oral)   Resp 18   Ht 6\' 1"  (1.854 m)   Wt 90.4 kg   SpO2 98%   BMI 26.29 kg/m   Intake/Output Summary (Last 24 hours) at 10/02/2023 0916 Last data filed at 10/02/2023 0500 Gross per 24 hour  Intake 460 ml  Output 0 ml  Net 460 ml   Intake/Output: I/O last 3 completed shifts: In: 1040 [P.O.:840; IV Piggyback:200] Out: 500 [Other:500]  Intake/Output this shift:  No intake/output data recorded. Weight change:  Gen: NAD CVS: RRR Resp: CTA Abd: +BS, soft, NT/ND Ext: no edema Access: RIJ TC  Recent Labs  Lab 09/29/23 0928 09/30/23 0425 10/02/23 0447  NA 134* 134* 130*  K 2.8* 3.2* 3.5  CL 96* 97* 94*  CO2 24 23 23   GLUCOSE 116* 142* 321*  BUN 37* 49* 67*  CREATININE 4.51* 4.90* 4.77*  ALBUMIN 2.7*  --  2.3*  CALCIUM 8.6* 8.6* 8.4*  PHOS 5.1*  --   --   AST 37  --  33  ALT 28  --  32   Liver Function Tests: Recent Labs  Lab 09/29/23 0928 10/02/23 0447  AST 37 33  ALT 28 32  ALKPHOS 105 107   BILITOT 1.0 0.5  PROT 6.4* 5.8*  ALBUMIN 2.7* 2.3*   No results for input(s): "LIPASE", "AMYLASE" in the last 168 hours. No results for input(s): "AMMONIA" in the last 168 hours. CBC: Recent Labs  Lab 09/29/23 0928 09/30/23 0425 10/02/23 0447  WBC 29.9* 25.6* 16.1*  NEUTROABS 26.9*  --   --   HGB 10.9* 10.9* 9.0*  HCT 33.6* 33.0* 28.2*  MCV 91.8 89.7 89.5  PLT 161 188 199   Cardiac Enzymes: No results for input(s): "CKTOTAL", "CKMB", "CKMBINDEX", "TROPONINI" in the last 168 hours. CBG: Recent Labs  Lab 10/01/23 0725 10/01/23 1110 10/01/23 1637 10/01/23 2132 10/02/23 0726  GLUCAP 229* 353* 326* 359* 327*    Iron Studies:  Recent Labs    10/02/23 0447  FERRITIN 664*   Studies/Results: No results found.  azithromycin  500 mg Oral Daily   calcitRIOL  0.25 mcg Oral Daily   Chlorhexidine Gluconate Cloth  6 each Topical Daily   Chlorhexidine Gluconate Cloth  6 each Topical Q0600   cycloSPORINE  1 drop Both Eyes BID   dorzolamide-timolol  1 drop Both Eyes BID   FLUoxetine  20 mg Oral QHS  folic acid  1,000 mcg Oral Daily   heparin  5,000 Units Subcutaneous Q8H   insulin aspart  0-5 Units Subcutaneous QHS   insulin aspart  0-9 Units Subcutaneous TID WC   latanoprost  1 drop Left Eye QHS   loperamide  4 mg Oral Once   metoprolol tartrate  12.5 mg Oral BID   multivitamin  1 tablet Oral QHS   pravastatin  80 mg Oral Daily   predniSONE  50 mg Oral Q breakfast   sodium chloride flush  3 mL Intravenous Q12H   tamsulosin  0.4 mg Oral Q supper    BMET    Component Value Date/Time   NA 130 (L) 10/02/2023 0447   K 3.5 10/02/2023 0447   CL 94 (L) 10/02/2023 0447   CO2 23 10/02/2023 0447   GLUCOSE 321 (H) 10/02/2023 0447   BUN 67 (H) 10/02/2023 0447   CREATININE 4.77 (H) 10/02/2023 0447   CALCIUM 8.4 (L) 10/02/2023 0447   GFRNONAA 12 (L) 10/02/2023 0447   GFRAA 44 (L) 01/14/2018 1112   CBC    Component Value Date/Time   WBC 16.1 (H) 10/02/2023 0447   RBC  3.15 (L) 10/02/2023 0447   HGB 9.0 (L) 10/02/2023 0447   HCT 28.2 (L) 10/02/2023 0447   PLT 199 10/02/2023 0447   MCV 89.5 10/02/2023 0447   MCH 28.6 10/02/2023 0447   MCHC 31.9 10/02/2023 0447   RDW 12.7 10/02/2023 0447   LYMPHSABS 0.7 09/29/2023 0928   MONOABS 2.0 (H) 09/29/2023 0928   EOSABS 0.0 09/29/2023 0928   BASOSABS 0.1 09/29/2023 0960

## 2023-10-02 NOTE — Progress Notes (Signed)
PROGRESS NOTE  INMAR LAPLUME HYQ:[id number] DOB: 06-Aug-1951 DOA: 09/29/2023 PCP: Kirstie Peri, MD  Brief History:  72 year old male with a history of hypertension, diabetes mellitus, hyperlipidemia, newly diagnosed ESRD, and anemia of CKD  The patient was recently hospitalized from 08/14/2023-08/20/2023 where he was declared new ESRD and initiated on dialysis. TDC was placed on 08/15/2023, and the patient was initiated on HD.    Pt is admitted secondary to general malaise, nausea and weakness; patient has also expressed intermittent nonproductive coughing spells and mild shortness of breath.  Patient denies chills, fever, overt bleeding, focal weaknesses, headache or any other complaints.   Of note, patient has been released from the skilled nursing facility about 1 week prior to this hospitalization.   Workup in the ED demonstrated positive COVID-19 infection, mild dehydration and hypokalemia.   TRH has been consulted to place in the hospital for further evaluation and management.  The patient also had a recent hospital admission from 08/21/2023 to 08/25/2023 when he was treated for sepsis secondary to Pseudomonas UTI and lobar pneumonia.  He was treated with IV cefepime, and discharged with Cipro for 2 additional doses.   Assessment/Plan:  COVID 19 Pneumonia -stable on RA -presented with intermittent nonproductive coughing spells and just mild short winded sensation with activity. -Continue supportive care, bronchodilators and steroids -CRP 32.9>>9.6 -Patient was started on steroids a the time of admission   pneumonia -Patient with elevated leukocytosis and also elevated procalcitonin -PCT 2.05 -initially ceftriaxone and azithro -Continue mucolytic's, flutter valve and bronchodilator as mentioned above -ceftriaxone to cefepime 10/01/23 -stable on RA   ESRD -Appreciate nephrology -last HD 09/30/23 -usually on MWF   Pyuria -sent urine culture 12/11 -start  empiric cefepime as pt had pseudomonas in urine last hospitalization   Diabetes mellitus type 2 -08/14/2023 hemoglobin A1c 5.6 -Continue NovoLog sliding scale -Continue reduced dose Semglee during hospitalization -CBGs elevated due to steroids   hypokalemia -Repleted -Continue to follow electrolytes -correction on HD   hypertension/hyperlipidemia -Continue statins and metoprolol   BPH -Continue Flomax.   increased intraocular pressure/glaucoma -Continue the use of latanoprost and also dorzolamide/timolol. -Continue outpatient follow-up with ophthalmology service.   depression/anxiety -Continue as needed Xanax and the use of Prozac.   generalized weakness -Most likely in the setting of pneumonia/COVID -PT evaluation>>HHPT   anemia of chronic kidney disease -Stable hemoglobin - Hgb stable in 9 range, no bleeding. On ESA per nephrology    Diarrhea -c diff neg -check stool pathogen panel--pending -prn loperamide -improving         Family Communication:  no Family at bedside   Consultants:  renal   Code Status:  FULL    DVT Prophylaxis:  [place] Heparin      Procedures: As Listed in Progress Note Above   Antibiotics: Azithro 12/9>> Cefepime 12/11>> Ceftriaxone 12/9>>12/10     Subjective:  Pt feeling stronger today.  Denies f/c, cp, sob.  Only one loose BM today.  No hematochezia.  No abd pain Objective: Vitals:   10/01/23 2159 10/02/23 0532 10/02/23 0756 10/02/23 1649  BP: 136/68 (!) 158/77 (!) 172/84 115/66  Pulse: 62 62 60 68  Resp:  18 18   Temp: 97.7 F (36.5 C) 98 F (36.7 C) 98.3 F (36.8 C) 97.9 F (36.6 C)  TempSrc: Oral Oral Oral Oral  SpO2: 96% 100% 98% 100%  Weight:      Height:        Intake/Output Summary (  Last 24 hours) at 10/02/2023 1830 Last data filed at 10/02/2023 1300 Gross per 24 hour  Intake 940 ml  Output 0 ml  Net 940 ml   Weight change:  Exam:  General:  Pt is alert, follows commands appropriately, not in acute  distress HEENT: No icterus, No thrush, No neck mass, /AT Cardiovascular: RRR, S1/S2, no rubs, no gallops Respiratory: fine bibasilar crackles. No wheeze Abdomen: Soft/+BS, non tender, non distended, no guarding Extremities: No edema, No lymphangitis, No petechiae, No rashes, no synovitis   Data Reviewed: I have personally reviewed following labs and imaging studies Basic Metabolic Panel: Recent Labs  Lab 09/29/23 0928 09/30/23 0425 10/02/23 0447  NA 134* 134* 130*  K 2.8* 3.2* 3.5  CL 96* 97* 94*  CO2 24 23 23   GLUCOSE 116* 142* 321*  BUN 37* 49* 67*  CREATININE 4.51* 4.90* 4.77*  CALCIUM 8.6* 8.6* 8.4*  MG 1.8  1.8 1.9  --   PHOS 5.1*  --   --    Liver Function Tests: Recent Labs  Lab 09/29/23 0928 10/02/23 0447  AST 37 33  ALT 28 32  ALKPHOS 105 107  BILITOT 1.0 0.5  PROT 6.4* 5.8*  ALBUMIN 2.7* 2.3*   No results for input(s): "LIPASE", "AMYLASE" in the last 168 hours. No results for input(s): "AMMONIA" in the last 168 hours. Coagulation Profile: No results for input(s): "INR", "PROTIME" in the last 168 hours. CBC: Recent Labs  Lab 09/29/23 0928 09/30/23 0425 10/02/23 0447  WBC 29.9* 25.6* 16.1*  NEUTROABS 26.9*  --   --   HGB 10.9* 10.9* 9.0*  HCT 33.6* 33.0* 28.2*  MCV 91.8 89.7 89.5  PLT 161 188 199   Cardiac Enzymes: No results for input(s): "CKTOTAL", "CKMB", "CKMBINDEX", "TROPONINI" in the last 168 hours. BNP: Invalid input(s): "POCBNP" CBG: Recent Labs  Lab 10/01/23 1637 10/01/23 2132 10/02/23 0726 10/02/23 1112 10/02/23 1646  GLUCAP 326* 359* 327* 412* 389*   HbA1C: No results for input(s): "HGBA1C" in the last 72 hours. Urine analysis:    Component Value Date/Time   COLORURINE AMBER (A) 09/29/2023 1524   APPEARANCEUR CLOUDY (A) 09/29/2023 1524   LABSPEC 1.017 09/29/2023 1524   PHURINE 5.0 09/29/2023 1524   GLUCOSEU NEGATIVE 09/29/2023 1524   HGBUR SMALL (A) 09/29/2023 1524   BILIRUBINUR NEGATIVE 09/29/2023 1524    KETONESUR NEGATIVE 09/29/2023 1524   PROTEINUR 100 (A) 09/29/2023 1524   NITRITE NEGATIVE 09/29/2023 1524   LEUKOCYTESUR MODERATE (A) 09/29/2023 1524   Sepsis Labs: @LABRCNTIP (procalcitonin:4,lacticidven:4) ) Recent Results (from the past 240 hours)  Resp panel by RT-PCR (RSV, Flu A&B, Covid) Anterior Nasal Swab     Status: Abnormal   Collection Time: 09/29/23 10:05 AM   Specimen: Anterior Nasal Swab  Result Value Ref Range Status   SARS Coronavirus 2 by RT PCR POSITIVE (A) NEGATIVE Final    Comment: (NOTE) SARS-CoV-2 target nucleic acids are DETECTED.  The SARS-CoV-2 RNA is generally detectable in upper respiratory specimens during the acute phase of infection. Positive results are indicative of the presence of the identified virus, but do not rule out bacterial infection or co-infection with other pathogens not detected by the test. Clinical correlation with patient history and other diagnostic information is necessary to determine patient infection status. The expected result is Negative.  Fact Sheet for Patients: BloggerCourse.com  Fact Sheet for Healthcare Providers: SeriousBroker.it  This test is not yet approved or cleared by the [place] FDA and  has been authorized for detection  and/or diagnosis of SARS-CoV-2 by FDA under an Emergency Use Authorization (EUA).  This EUA will remain in effect (meaning this test can be used) for the duration of  the COVID-19 declaration under Section 564(b)(1) of the A ct, 21 U.S.C. section 360bbb-3(b)(1), unless the authorization is terminated or revoked sooner.     Influenza A by PCR NEGATIVE NEGATIVE Final   Influenza B by PCR NEGATIVE NEGATIVE Final    Comment: (NOTE) The Xpert Xpress SARS-CoV-2/FLU/RSV plus assay is intended as an aid in the diagnosis of influenza from Nasopharyngeal swab specimens and should not be used as a sole basis for treatment. Nasal washings  and aspirates are unacceptable for Xpert Xpress SARS-CoV-2/FLU/RSV testing.  Fact Sheet for Patients: BloggerCourse.com  Fact Sheet for Healthcare Providers: SeriousBroker.it  This test is not yet approved or cleared by the [place] FDA and has been authorized for detection and/or diagnosis of SARS-CoV-2 by FDA under an Emergency Use Authorization (EUA). This EUA will remain in effect (meaning this test can be used) for the duration of the COVID-19 declaration under Section 564(b)(1) of the Act, 21 U.S.C. section 360bbb-3(b)(1), unless the authorization is terminated or revoked.     Resp Syncytial Virus by PCR NEGATIVE NEGATIVE Final    Comment: (NOTE) Fact Sheet for Patients: BloggerCourse.com  Fact Sheet for Healthcare Providers: SeriousBroker.it  This test is not yet approved or cleared by the [place] FDA and has been authorized for detection and/or diagnosis of SARS-CoV-2 by FDA under an Emergency Use Authorization (EUA). This EUA will remain in effect (meaning this test can be used) for the duration of the COVID-19 declaration under Section 564(b)(1) of the Act, 21 U.S.C. section 360bbb-3(b)(1), unless the authorization is terminated or revoked.  Performed at [facility], [address], [place], [place] [address]   Urine Culture (for pregnant, neutropenic or urologic patients or patients with an indwelling urinary catheter)     Status: Abnormal   Collection Time: 09/29/23  3:24 PM   Specimen: Urine, Clean Catch  Result Value Ref Range Status   Specimen Description   Final    URINE, CLEAN CATCH Performed at [facility], [address] [place], [place] [address]    Special Requests   Final    NONE Performed at [facility], [address] [place], [place] [address]    Culture MULTIPLE SPECIES PRESENT, SUGGEST RECOLLECTION (A)  Final   Report Status  10/02/2023 FINAL  Final  C Difficile Quick Screen w PCR reflex     Status: None   Collection Time: 10/01/23 11:15 AM   Specimen: STOOL  Result Value Ref Range Status   C Diff antigen NEGATIVE NEGATIVE Final   C Diff toxin NEGATIVE NEGATIVE Final   C Diff interpretation No C. difficile detected.  Final    Comment: Performed at [facility], [address] [place], [place] [address]     Scheduled Meds:  azithromycin  500 mg Oral Daily   calcitRIOL  0.25 mcg Oral Daily   Chlorhexidine Gluconate Cloth  6 each Topical Daily   Chlorhexidine Gluconate Cloth  6 each Topical Q0600   cycloSPORINE  1 drop Both Eyes BID   dorzolamide-timolol  1 drop Both Eyes BID   FLUoxetine  20 mg Oral QHS   folic acid  1,000 mcg Oral Daily   heparin  5,000 Units Subcutaneous Q8H   insulin aspart  0-5 Units Subcutaneous QHS   insulin aspart  0-9 Units Subcutaneous TID WC   insulin  aspart  4 Units Subcutaneous TID WC   latanoprost  1 drop Left Eye QHS   metoprolol tartrate  12.5 mg Oral BID   multivitamin  1 tablet Oral QHS   pravastatin  80 mg Oral Daily   sodium chloride flush  3 mL Intravenous Q12H   tamsulosin  0.4 mg Oral Q supper   Continuous Infusions:  ceFEPime (MAXIPIME) IV 1 g (10/01/23 2016)    Procedures/Studies: DG Chest 2 View Result Date: 09/29/2023 CLINICAL DATA:  Weakness, nausea. EXAM: CHEST - 2 VIEW COMPARISON:  Same day. FINDINGS: Stable cardiomediastinal silhouette. Right internal jugular dialysis catheter is unchanged. Hypoinflation of the lungs is noted with probable bibasilar subsegmental atelectasis and possible small pleural effusions. Bony thorax is unremarkable. IMPRESSION: Hypoinflation of lungs with probable bibasilar subsegmental atelectasis and possible small pleural effusions. Electronically Signed   By: Lupita Raider M.D.   On: 09/29/2023 12:53   DG Chest Portable 1 View Result Date: 09/29/2023 CLINICAL DATA:  ?Pna Pt complains of generalized weakness, nausea, and loss  of appetite x 1 week. Pt states he was discharged from SNF around a week ago EXAM: PORTABLE CHEST 1 VIEW COMPARISON:  Chest x-ray 08/14/2023 FINDINGS: Right chest wall dialysis catheter with tip overlying the right atrium. The heart and mediastinal contours are within normal limits. Low lung volumes. Bibasilar airspace opacities. No pulmonary edema. Likely trace bilateral pleural effusion. No pneumothorax. No acute osseous abnormality. IMPRESSION: 1. Low lung volumes with bibasilar airspace opacities that could represent a combination of atelectasis and/or infection/inflammation. Recommend repeat PA and lateral view with improved inspiratory effort for further evaluation. 2. Likely trace bilateral pleural effusions. Electronically Signed   By: Tish Frederickson M.D.   On: 09/29/2023 09:55    Catarina Hartshorn, DO  [facility]  If 7PM-7AM, please contact night-coverage [email or web address] Password [email or web address] 10/02/2023, 6:30 PM   LOS: 2 days

## 2023-10-03 ENCOUNTER — Inpatient Hospital Stay (HOSPITAL_COMMUNITY): Payer: Medicare Other

## 2023-10-03 DIAGNOSIS — N186 End stage renal disease: Secondary | ICD-10-CM | POA: Diagnosis not present

## 2023-10-03 DIAGNOSIS — U071 COVID-19: Secondary | ICD-10-CM | POA: Diagnosis not present

## 2023-10-03 DIAGNOSIS — R531 Weakness: Secondary | ICD-10-CM | POA: Diagnosis not present

## 2023-10-03 DIAGNOSIS — E782 Mixed hyperlipidemia: Secondary | ICD-10-CM

## 2023-10-03 DIAGNOSIS — E1165 Type 2 diabetes mellitus with hyperglycemia: Secondary | ICD-10-CM

## 2023-10-03 LAB — GASTROINTESTINAL PANEL BY PCR, STOOL (REPLACES STOOL CULTURE)

## 2023-10-03 LAB — GLUCOSE, CAPILLARY
Glucose-Capillary: 146 mg/dL — ABNORMAL HIGH (ref 70–99)
Glucose-Capillary: 322 mg/dL — ABNORMAL HIGH (ref 70–99)
Glucose-Capillary: 269 mg/dL — ABNORMAL HIGH (ref 70–99)
Glucose-Capillary: 96 mg/dL (ref 70–99)
Glucose-Capillary: 148 mg/dL — ABNORMAL HIGH (ref 70–99)

## 2023-10-03 LAB — C-REACTIVE PROTEIN: CRP: 6.1 mg/dL — ABNORMAL HIGH (ref <1.0)

## 2023-10-03 LAB — D-DIMER, QUANTITATIVE: D-Dimer, Quant: 1.94 ug{FEU}/mL — ABNORMAL HIGH (ref 0.00–0.50)

## 2023-10-03 LAB — TROPONIN I (HIGH SENSITIVITY)
Troponin I (High Sensitivity): 43 ng/L — ABNORMAL HIGH (ref <18)
Troponin I (High Sensitivity): 43 ng/L — ABNORMAL HIGH (ref <18)

## 2023-10-03 LAB — CBC
HCT: 29.8 % — ABNORMAL LOW (ref 39.0–52.0)
Hemoglobin: 9.6 g/dL — ABNORMAL LOW (ref 13.0–17.0)
MCH: 28.6 pg (ref 26.0–34.0)
MCHC: 32.2 g/dL (ref 30.0–36.0)
MCV: 88.7 fL (ref 80.0–100.0)
Platelets: 185 10*3/uL (ref 150–400)
RBC: 3.36 MIL/uL — ABNORMAL LOW (ref 4.22–5.81)
RDW: 12.3 % (ref 11.5–15.5)
WBC: 14.9 10*3/uL — ABNORMAL HIGH (ref 4.0–10.5)
nRBC: 0 % (ref 0.0–0.2)

## 2023-10-03 LAB — FERRITIN: Ferritin: 523 ng/mL — ABNORMAL HIGH (ref 24–336)

## 2023-10-03 MED ORDER — SODIUM CHLORIDE 0.9 % IV SOLN
2.0000 g | INTRAVENOUS | Status: AC
  Administered 2023-10-03: 2 g via INTRAVENOUS
  Filled 2023-10-03: qty 12.5

## 2023-10-03 MED ORDER — TECHNETIUM TO 99M ALBUMIN AGGREGATED
4.4000 | Freq: Once | INTRAVENOUS | Status: AC | PRN
  Administered 2023-10-03: 4.4 via INTRAVENOUS

## 2023-10-03 MED ORDER — HYDROMORPHONE HCL 1 MG/ML IJ SOLN
0.5000 mg | INTRAMUSCULAR | Status: DC | PRN
  Administered 2023-10-03 (×2): 0.5 mg via INTRAVENOUS
  Filled 2023-10-03 (×2): qty 0.5

## 2023-10-03 MED ORDER — HYDROCODONE-ACETAMINOPHEN 5-325 MG PO TABS
1.0000 | ORAL_TABLET | Freq: Four times a day (QID) | ORAL | Status: DC | PRN
  Administered 2023-10-03 – 2023-10-09 (×3): 1 via ORAL
  Filled 2023-10-03 (×3): qty 1

## 2023-10-03 NOTE — Care Management Important Message (Signed)
Important Message  Patient Details  Name: Philip Benjamin MRN: [id number] Date of Birth: 10-03-1951   Important Message Given:  N/A - LOS <3 / Initial given by admissions     Corey Harold 10/03/2023, 10:52 AM

## 2023-10-03 NOTE — Progress Notes (Signed)
Pt's daughter at bedside. Updated on current plan of care. Pt sleeping but awakens to name called. Denies c/o at this time.

## 2023-10-03 NOTE — Consult Note (Signed)
[facility] Liaison Note  10/03/2023  Philip Benjamin Dec 04, 1950 [id number]  Location: RN Hospital Liaison screened the patient remotely at [facility].  Insurance: Medicare   Philip Benjamin is a 72 y.o. male who is a Primary Care Patient of Kirstie Peri, MD The patient was screened for readmission hospitalization with noted high risk score for unplanned readmission risk with 3 IP in 6 months.  The patient was assessed for potential Care Management service needs for post hospital transition for care coordination. Review of patient's electronic medical record reveals patient was admitted for COVID-19. Recommendation requested for a SNF level of care. If pt is d/c to a non-affiliated facility the facility will continue to address pt's ongoing needs. If pt is discharged to a networking facility, liaison will collaborate with the PAC-RN concerning this pt's discharge disposition.   Plan: [facility] Liaison will continue to follow progress and disposition to asess for post hospital community care coordination/management needs.  Referral request for community care coordination: pending disposition.   [facility] Care Management/Population Health does not replace or interfere with any arrangements made by the Inpatient Transition of Care team.   For questions contact:   Elliot Cousin, RN, [facility] Liaison [place]   [facility], Population Health Office Hours MTWF  8:00 am-6:00 pm Direct Dial: [phone] mobile [phone] [Office toll free line] Office Hours are M-F 8:30 - 5 pm [name]

## 2023-10-03 NOTE — Progress Notes (Signed)
   10/03/23 1325  Vitals  BP (!) 72/40  BP Location Right Arm  BP Method Manual  Patient Position (if appropriate) Sitting  Pulse Rate 72  Pulse Rate Source Apical  Resp 16  Level of Consciousness  Level of Consciousness Alert  MEWS COLOR  MEWS Score Color Yellow  Pain Assessment  Pain Scale 0-10  Pain Score 4  Pain Type Acute pain  Pain Location Abdomen (RUQ/Chest/rib area)  Pain Orientation Right;Upper  Pain Descriptors / Indicators Aching;Sharp  Pain Frequency Intermittent  Pain Onset Sudden  Pain Intervention(s) Repositioned  MEWS Score  MEWS Temp 0  MEWS Systolic 2  MEWS Pulse 0  MEWS RR 0  MEWS LOC 0  MEWS Score 2   Notified by NT that pt's BP was reading 83/51 and that pt was complaining of not feeling good. Arrived to room to find pt sitting in chair, pale, restless. Pt A&O x4, states, "I feel so bad, please help me. I can't see!:" Pt states sudden onset of inability to see, pupils round, 2, equal and reactive to light. Pt follows all commands. Manual b/p as above. Unable to palpate radial pulse. Called for assistance and pt moved from chair into bed in supine position (pt required 2 full assistants and gait belt for transfer). Pt c/o increased lightheadedness with standing. Once pt situated in bed, manual b/p rechecked and noted to be 104/54, HR remains 70's apical, now able to palpate radial pulse. Pt states that vision has returned and lightheadedness is gone. Pt began grimacing and holding right chest/upper abd area, states is sharp stabbing pain. Denies SOB. EKG obtained showing NSR. Blood sugar checked resulting at 269mg /dl. Skin warm and dry, color appropriate. Lungs clear, HR RRR. Abd soft, non-distended with bowel sounds (+) x4. Pt now resting quietly in bed, no further complaints of pain. Did speak with daughter Wallace Cullens on phone who called during the episode and updated her on current condition. MD Tat notified of episode and current condition.

## 2023-10-03 NOTE — [place] FL2 (Signed)
[place] MEDICAID FL2 LEVEL OF CARE FORM     IDENTIFICATION  Patient Name: Philip Benjamin Birthdate: December 09, 1950 Sex: male Admission Date (Current Location): 09/29/2023  [facility] and [place] Number:  [employer] and Address:  [facility],  [address], [place] [address]      Provider Number: [id number]  Attending Physician Name and Address:  Catarina Hartshorn, MD  Relative Name and Phone Number:       Current Level of Care:   Recommended Level of Care: Skilled Nursing Facility Prior Approval Number:    Date Approved/Denied:   PASRR Number: [id number]  Discharge Plan:      Current Diagnoses: Patient Active Problem List   Diagnosis Date Noted   Generalized weakness 10/02/2023   COVID-19 09/29/2023   Pseudomonas sepsis (HCC) 08/23/2023   HCAP (healthcare-associated pneumonia) 08/22/2023   Thrombocytopenia (HCC) 08/22/2023   Transaminitis 08/22/2023   Folate deficiency 08/22/2023   Type 2 diabetes mellitus with hyperglycemia (HCC) 08/22/2023   Glaucoma 08/22/2023   Sepsis due to undetermined organism (HCC) 08/22/2023   Cystitis with hematuria 08/22/2023   Urinary tract infection with hematuria 08/22/2023   Sepsis due to urinary tract infection (HCC) 08/21/2023   Malnutrition of moderate degree 08/18/2023   Renal failure 08/14/2023   End-stage renal disease on hemodialysis (HCC) 08/14/2023   Essential hypertension 08/14/2023   T2DM (type 2 diabetes mellitus) (HCC) 08/14/2023   Mixed hyperlipidemia 08/14/2023   BPH (benign prostatic hyperplasia) 08/14/2023    Orientation RESPIRATION BLADDER Height & Weight     Self, Time, Situation, Place  Normal Continent Weight: 192 lb 3.9 oz (87.2 kg) Height:  6\' 1"  (185.4 cm)  BEHAVIORAL SYMPTOMS/MOOD NEUROLOGICAL BOWEL NUTRITION STATUS      Continent Diet (see dc summary)  AMBULATORY STATUS COMMUNICATION OF NEEDS Skin   Extensive Assist Verbally Normal                       Personal Care  Assistance Level of Assistance  Bathing, Feeding, Dressing Bathing Assistance: Limited assistance Feeding assistance: Independent Dressing Assistance: Limited assistance     Functional Limitations Info  Sight, Hearing, Speech Sight Info: Adequate Hearing Info: Adequate Speech Info: Adequate    SPECIAL CARE FACTORS FREQUENCY  PT (By licensed PT), OT (By licensed OT)     PT Frequency: 5x week OT Frequency: 5x week            Contractures Contractures Info: Not present    Additional Factors Info  Code Status, Allergies Code Status Info: Full Allergies Info: NKA           Current Medications (10/03/2023):  This is the current hospital active medication list Current Facility-Administered Medications  Medication Dose Route Frequency Provider Last Rate Last Admin   acetaminophen (TYLENOL) tablet 650 mg  650 mg Oral Q6H PRN Vassie Loll, MD   650 mg at 10/03/23 1050   Or   acetaminophen (TYLENOL) suppository 650 mg  650 mg Rectal Q6H PRN Vassie Loll, MD       ALPRAZolam Prudy Feeler) tablet 0.25 mg  0.25 mg Oral TID PRN Vassie Loll, MD   0.25 mg at 10/03/23 0912   alteplase (CATHFLO ACTIVASE) injection 2 mg  2 mg Intracatheter Once PRN Anthony Sar, MD       azithromycin [facility]) tablet 500 mg  500 mg Oral Daily Vassie Loll, MD   500 mg at 10/03/23 0901   calcitRIOL (ROCALTROL) capsule 0.25 mcg  0.25 mcg  Oral Daily Vassie Loll, MD   0.25 mcg at 10/03/23 0901   ceFEPIme (MAXIPIME) 2 g in sodium chloride 0.9 % 100 mL IVPB  2 g Intravenous Q M,W,F-HD Tat, Onalee Hua, MD       Chlorhexidine Gluconate Cloth 2 % PADS 6 each  6 each Topical Daily Vassie Loll, MD   6 each at 10/03/23 1191   Chlorhexidine Gluconate Cloth 2 % PADS 6 each  6 each Topical Q0600 Anthony Sar, MD   6 each at 10/03/23 0517   chlorpheniramine-HYDROcodone (TUSSIONEX) 10-8 MG/5ML suspension 5 mL  5 mL Oral Q12H PRN Vassie Loll, MD   5 mL at 09/29/23 1630   cycloSPORINE (RESTASIS) 0.05 % ophthalmic  emulsion 1 drop  1 drop Both Eyes BID Vassie Loll, MD   1 drop at 10/03/23 0912   dorzolamide-timolol (COSOPT) 2-0.5 % ophthalmic solution 1 drop  1 drop Both Eyes BID Vassie Loll, MD   1 drop at 10/03/23 0859   FLUoxetine (PROZAC) capsule 20 mg  20 mg Oral QHS Vassie Loll, MD   20 mg at 10/02/23 2314   folic acid (FOLVITE) tablet 1 mg  1,000 mcg Oral Daily Vassie Loll, MD   1 mg at 10/03/23 0900   heparin injection 1,000 Units  1,000 Units Intracatheter PRN Anthony Sar, MD   3,800 Units at 10/02/23 2230   heparin injection 1,000 Units  1,000 Units Dialysis PRN Anthony Sar, MD   1,000 Units at 10/02/23 1906   heparin injection 2,000 Units  2,000 Units Dialysis Once in dialysis Ethelene Hal, MD       heparin injection 5,000 Units  5,000 Units Subcutaneous Tedra Coupe Vassie Loll, MD   5,000 Units at 10/03/23 0515   HYDROmorphone (DILAUDID) injection 0.5 mg  0.5 mg Intravenous Q4H PRN Adefeso, Oladapo, DO   0.5 mg at 10/03/23 0911   insulin aspart (novoLOG) injection 0-5 Units  0-5 Units Subcutaneous QHS Vassie Loll, MD   5 Units at 10/01/23 2149   insulin aspart (novoLOG) injection 0-9 Units  0-9 Units Subcutaneous TID WC Vassie Loll, MD   7 Units at 10/03/23 1217   insulin aspart (novoLOG) injection 4 Units  4 Units Subcutaneous TID WC Tat, Onalee Hua, MD   4 Units at 10/03/23 1216   ipratropium-albuterol (DUONEB) 0.5-2.5 (3) MG/3ML nebulizer solution 3 mL  3 mL Nebulization Q6H PRN Vassie Loll, MD       latanoprost (XALATAN) 0.005 % ophthalmic solution 1 drop  1 drop Left Eye Leonia Reader, MD   1 drop at 10/02/23 2315   metoprolol tartrate (LOPRESSOR) tablet 12.5 mg  12.5 mg Oral BID Vassie Loll, MD   12.5 mg at 10/03/23 0901   multivitamin (RENA-VIT) tablet 1 tablet  1 tablet Oral QHS Vassie Loll, MD   1 tablet at 10/02/23 2313   ondansetron (ZOFRAN) tablet 4 mg  4 mg Oral Q6H PRN Vassie Loll, MD       Or   ondansetron [facility]) injection 4 mg  4 mg Intravenous Q6H PRN  Vassie Loll, MD   4 mg at 09/29/23 2230   Oral care mouth rinse  15 mL Mouth Rinse PRN Vassie Loll, MD       phenol (CHLORASEPTIC) mouth spray 1 spray  1 spray Mouth/Throat PRN Catarina Hartshorn, MD   1 spray at 10/02/23 0531   pravastatin (PRAVACHOL) tablet 80 mg  80 mg Oral Daily Vassie Loll, MD   80 mg at 10/03/23 0901   sodium chloride  flush (NS) 0.9 % injection 3 mL  3 mL Intravenous Q12H Vassie Loll, MD   3 mL at 10/03/23 1002   sodium chloride flush (NS) 0.9 % injection 3 mL  3 mL Intravenous PRN Vassie Loll, MD       tamsulosin [facility]) capsule 0.4 mg  0.4 mg Oral Q supper Vassie Loll, MD   0.4 mg at 10/02/23 1648     Discharge Medications: Please see discharge summary for a list of discharge medications.  Relevant Imaging Results:  Relevant Lab Results:   Additional Information SSN: [id number]  M[facility] [address] [place], [place]

## 2023-10-03 NOTE — TOC Progression Note (Signed)
Transition of Care [facility]) - Progression Note    Patient Details  Name: Philip Benjamin MRN: [id number] Date of Birth: 08-30-1951  Transition of Care [facility]) CM/SW Contact  Elliot Gault, LCSW Phone Number: 10/03/2023, 12:40 PM  Clinical Narrative:     PT now recommending SNF rehab. Pt and dtr agreeable. CMS provider options reviewed. Admission may be delayed due to pt's covid diagnosis and need for quarantine period before pt can be admitted. CMS provider options reviewed with pt and will refer as requested.   Expected Discharge Plan: Skilled Nursing Facility Barriers to Discharge: SNF Pending bed offer, Other (must enter comment)  Expected Discharge Plan and Services In-house Referral: Clinical Social Work   Post Acute Care Choice: Skilled Nursing Facility Living arrangements for the past 2 months: Single Family Home Expected Discharge Date: 10/03/23                         HH Arranged: PT, RN HH Agency: [facility] Date [facility] Agency Contacted: 09/30/23 Time HH Agency Contacted: [phone] Representative spoke with at [facility] Agency: Clydie Braun   Social Determinants of Health (SDOH) Interventions SDOH Screenings   Food Insecurity: No Food Insecurity (09/29/2023)  Housing: Patient Declined (09/29/2023)  Transportation Needs: No Transportation Needs (09/29/2023)  Utilities: Not At Risk (09/29/2023)  Tobacco Use: Low Risk  (09/29/2023)  Health Literacy: Medium Risk (09/05/2023)   Received from [facility]    Readmission Risk Interventions    10/01/2023    7:48 AM 08/22/2023   12:48 PM 08/15/2023   12:29 PM  Readmission Risk Prevention Plan  Transportation Screening Complete Complete Complete  Home Care Screening  Complete Complete  Medication Review (RN CM)  Complete Complete  HRI or Home Care Consult Complete    Social Work Consult for Recovery Care Planning/Counseling Complete    Palliative Care Screening Not Applicable    Medication Review Furniture conservator/restorer) Complete

## 2023-10-03 NOTE — TOC Transition Note (Signed)
Transition of Care [facility]) - Discharge Note   Patient Details  Name: Philip Benjamin MRN: [id number] Date of Birth: 1951-04-11  Transition [facility] Care [facility]) CM/SW Contact:  Elliot Gault, LCSW Phone Number: 10/03/2023, 10:50 AM   Clinical Narrative:     Pt stable to dc home after HD today per MD. Updated Clydie Braun at [facility] of pt's dc. They will follow at home.  No other TOC needs for dc.  Final next level of care: Home w Home Health Services Barriers to Discharge: Barriers Resolved   Patient Goals and CMS Choice Patient states their goals for this hospitalization and ongoing recovery are:: return home CMS Medicare.gov Compare Post Acute Care list provided to:: Patient Choice offered to / list presented to : Adult Children [place] ownership interest in [facility].provided to::  (n/a)    Discharge Placement                       Discharge Plan and Services Additional resources added to the After Visit Summary for   In-house Referral: Clinical Social Work   Post Acute Care Choice: Resumption of Svcs/PTA Provider                    HH Arranged: PT, RN HH Agency: [facility] Date [facility] Agency Contacted: 09/30/23 Time HH Agency Contacted: [phone] Representative spoke with at [facility] Agency: Clydie Braun  Social Drivers of Health (SDOH) Interventions SDOH Screenings   Food Insecurity: No Food Insecurity (09/29/2023)  Housing: Patient Declined (09/29/2023)  Transportation Needs: No Transportation Needs (09/29/2023)  Utilities: Not At Risk (09/29/2023)  Tobacco Use: Low Risk  (09/29/2023)  Health Literacy: Medium Risk (09/05/2023)   Received from [facility]     Readmission Risk Interventions    10/01/2023    7:48 AM 08/22/2023   12:48 PM 08/15/2023   12:29 PM  Readmission Risk Prevention Plan  Transportation Screening Complete Complete Complete  Home Care Screening  Complete Complete  Medication Review (RN CM)  Complete Complete  HRI or Home  Care Consult Complete    Social Work Consult for Recovery Care Planning/Counseling Complete    Palliative Care Screening Not Applicable    Medication Review Oceanographer) Complete

## 2023-10-03 NOTE — Progress Notes (Addendum)
   10/03/23 1421  Vitals  BP 100/64  BP Location Right Arm  BP Method Manual  Patient Position (if appropriate) Lying  Pulse Rate 78  Pulse Rate Source Radial  Resp 16  Level of Consciousness  Level of Consciousness Alert  MEWS COLOR  MEWS Score Color Green  Oxygen Therapy  SpO2 99 %  O2 Device Room Air  Pain Assessment  Pain Scale 0-10  Pain Score 0  MEWS Score  MEWS Temp 0  MEWS Systolic 1  MEWS Pulse 0  MEWS RR 0  MEWS LOC 0  MEWS Score 1   Pt currently sleeping, easily awakened to name called. Pt did not eat his lunch, states that he is not very hungry right now. Denies any c/o pain, SOB, N/V or vision disturbance. Current B/P as above. Resp even and nonlabored, breath sounds clear. HR RRR, abd soft with + bowels sounds and no abd tenderness to palpation.  Lab in room for blood draw. Pt advised to call for needs. States understanding. Call bell within reach, bed alarm on for safety. Just notified by dialysis nurse that pt will not receive dialysis today per MD, will be dialyzed tomorrow. Pt updated and states understanding. Daughter Wallace Cullens updated as well.

## 2023-10-03 NOTE — Plan of Care (Signed)
  Problem: Education: Goal: Knowledge of risk factors and measures for prevention of condition will improve Outcome: Progressing   Problem: Coping: Goal: Psychosocial and spiritual needs will be supported Outcome: Progressing   

## 2023-10-03 NOTE — Discharge Summary (Signed)
Physician Discharge Summary   Patient: Philip Benjamin MRN: [id number] DOB: 04/01/1951  Admit date:     09/29/2023  Discharge date: 10/03/23  Discharge Physician: Onalee Hua Amera Banos   PCP: Kirstie Peri, MD   Recommendations at discharge:   Please follow up with primary care provider within 1-2 weeks  Please repeat BMP and CBC in one week  Discharge Diagnoses: Principal Problem:   COVID-19 Active Problems:   Generalized weakness  Resolved Problems:   * No resolved hospital problems. *  Hospital Course: 72 year old male with a history of hypertension, diabetes mellitus, hyperlipidemia, newly diagnosed ESRD, and anemia of CKD  The patient was recently hospitalized from 08/14/2023-08/20/2023 where he was declared new ESRD and initiated on dialysis. TDC was placed on 08/15/2023, and the patient was initiated on HD.    Pt is admitted secondary to general malaise, nausea and weakness; patient has also expressed intermittent nonproductive coughing spells and mild shortness of breath.  Patient denies chills, fever, overt bleeding, focal weaknesses, headache or any other complaints.   Of note, patient has been released from the skilled nursing facility about 1 week prior to this hospitalization.   Workup in the ED demonstrated positive COVID-19 infection, mild dehydration and hypokalemia.   TRH has been consulted to place in the hospital for further evaluation and management.  The patient also had a recent hospital admission from 08/21/2023 to 08/25/2023 when he was treated for sepsis secondary to Pseudomonas UTI and lobar pneumonia.  He was treated with IV cefepime, and discharged with Cipro for 2 additional doses.  Assessment and Plan: COVID 19 Pneumonia -stable on RA -presented with intermittent nonproductive coughing spells and just mild short winded sensation with activity. -Continue supportive care, bronchodilators and steroids -CRP 32.9>>9.6>>6.1 -Patient was started on steroids a the  time of admission   pneumonia -Patient with elevated leukocytosis and also elevated procalcitonin -PCT 2.05 -initially ceftriaxone and azithro -Continue mucolytic's, flutter valve and bronchodilator as mentioned above -ceftriaxone to cefepime 10/01/23 -stable on RA -received 5 days azithro -will give cefepime 2 grams on HD 12/13--pt will have had 5 days total lasting till his next HD   ESRD -Appreciate nephrology -last HD 10/03/23 -usually on MWF   Pyuria/UTI -sent urine culture 12/11--multiple organisms -startef empiric cefepime as pt had pseudomonas in urine last hospitalization -will give cefepime 2 grams on HD 12/13--pt will have had 5 days total lasting till his next HD   Diabetes mellitus type 2 -08/14/2023 hemoglobin A1c 5.6 -Continue NovoLog sliding scale -Continue reduced dose Semglee during hospitalization -CBGs elevated due to steroids   hypokalemia -Repleted -Continue to follow electrolytes -correction on HD   hypertension/hyperlipidemia -Continue statins and metoprolol   BPH -Continue Flomax.   increased intraocular pressure/glaucoma -Continue the use of latanoprost and also dorzolamide/timolol. -Continue outpatient follow-up with ophthalmology service.   depression/anxiety -Continue as needed Xanax and the use of Prozac.   generalized weakness -Most likely in the setting of pneumonia/COVID -PT evaluation>>HHPT   anemia of chronic kidney disease -Stable hemoglobin - Hgb stable in 9 range, no bleeding. On ESA per nephrology    Diarrhea -c diff neg -check stool pathogen panel--pending -prn loperamide -improving      Consultants: renal Procedures performed: none  Disposition: Home Diet recommendation:  renal DISCHARGE MEDICATION: Allergies as of 10/03/2023   No Known Allergies      Medication List     STOP taking these medications    ALPRAZolam 0.25 MG tablet Commonly known as: Prudy Feeler  TAKE these medications     acetaminophen 650 MG CR tablet Commonly known as: TYLENOL Take 650 mg by mouth every 6 (six) hours as needed for pain.   calcitRIOL 0.25 MCG capsule Commonly known as: ROCALTROL Take 0.25 mcg by mouth daily.   Cholecalciferol 125 MCG (5000 UT) capsule Take by mouth.   cycloSPORINE 0.05 % ophthalmic emulsion Commonly known as: RESTASIS SMARTSIG:In Eye(s)   Dorzolamide HCl-Timolol Mal PF 2-0.5 % Soln Apply 1 drop to eye 2 (two) times daily.   FLUoxetine 20 MG/5ML solution Commonly known as: PROZAC Take 5 mLs by mouth daily.   folic acid 800 MCG tablet Commonly known as: FOLVITE Take 800 mcg by mouth daily.   Lantus 100 UNIT/ML injection Generic drug: insulin glargine Inject 26 Units into the skin daily.   latanoprost 0.005 % ophthalmic solution Commonly known as: XALATAN Place 1 drop into both eyes at bedtime. What changed: how to take this   losartan 100 MG tablet Commonly known as: COZAAR Take 100 mg by mouth daily.   metoprolol tartrate 25 MG tablet Commonly known as: LOPRESSOR Take 0.5 tablets (12.5 mg total) by mouth 2 (two) times daily.   multivitamin Tabs tablet Take 1 tablet by mouth at bedtime.   nystatin cream Commonly known as: MYCOSTATIN Apply 1 application topically 2 (two) times daily as needed. FOR YEAST OR SKIN RASHES.   pravastatin 80 MG tablet Commonly known as: PRAVACHOL Take 1 tablet by mouth daily.   tamsulosin 0.4 MG Caps capsule Commonly known as: FLOMAX Take 0.4 mg by mouth daily with supper.        Discharge Exam: Filed Weights   09/30/23 2300 10/02/23 1835 10/02/23 2230  Weight: 90.4 kg 88.1 kg 87.2 kg   HEENT:  [place]/AT, No thrush, no icterus CV:  RRR, no rub, no S3, no S4 Lung:  bibasilar rales.  No wheeze Abd:  soft/+BS, NT Ext:  No edema, no lymphangitis, no synovitis, no rash   Condition at discharge: stable  The results of significant diagnostics from this hospitalization (including imaging, microbiology,  ancillary and laboratory) are listed below for reference.   Imaging Studies: DG Chest 2 View Result Date: 09/29/2023 CLINICAL DATA:  Weakness, nausea. EXAM: CHEST - 2 VIEW COMPARISON:  Same day. FINDINGS: Stable cardiomediastinal silhouette. Right internal jugular dialysis catheter is unchanged. Hypoinflation of the lungs is noted with probable bibasilar subsegmental atelectasis and possible small pleural effusions. Bony thorax is unremarkable. IMPRESSION: Hypoinflation of lungs with probable bibasilar subsegmental atelectasis and possible small pleural effusions. Electronically Signed   By: Lupita Raider M.D.   On: 09/29/2023 12:53   DG Chest Portable 1 View Result Date: 09/29/2023 CLINICAL DATA:  ?Pna Pt complains of generalized weakness, nausea, and loss of appetite x 1 week. Pt states he was discharged from SNF around a week ago EXAM: PORTABLE CHEST 1 VIEW COMPARISON:  Chest x-ray 08/14/2023 FINDINGS: Right chest wall dialysis catheter with tip overlying the right atrium. The heart and mediastinal contours are within normal limits. Low lung volumes. Bibasilar airspace opacities. No pulmonary edema. Likely trace bilateral pleural effusion. No pneumothorax. No acute osseous abnormality. IMPRESSION: 1. Low lung volumes with bibasilar airspace opacities that could represent a combination of atelectasis and/or infection/inflammation. Recommend repeat PA and lateral view with improved inspiratory effort for further evaluation. 2. Likely trace bilateral pleural effusions. Electronically Signed   By: Tish Frederickson M.D.   On: 09/29/2023 09:55    Microbiology: Results for orders placed or performed during  the hospital encounter of 09/29/23  Resp panel by RT-PCR (RSV, Flu A&B, Covid) Anterior Nasal Swab     Status: Abnormal   Collection Time: 09/29/23 10:05 AM   Specimen: Anterior Nasal Swab  Result Value Ref Range Status   SARS Coronavirus 2 by RT PCR POSITIVE (A) NEGATIVE Final    Comment:  (NOTE) SARS-CoV-2 target nucleic acids are DETECTED.  The SARS-CoV-2 RNA is generally detectable in upper respiratory specimens during the acute phase of infection. Positive results are indicative of the presence of the identified virus, but do not rule out bacterial infection or co-infection with other pathogens not detected by the test. Clinical correlation with patient history and other diagnostic information is necessary to determine patient infection status. The expected result is Negative.  Fact Sheet for Patients: BloggerCourse.com  Fact Sheet for Healthcare Providers: SeriousBroker.it  This test is not yet approved or cleared by the [place] FDA and  has been authorized for detection and/or diagnosis of SARS-CoV-2 by FDA under an Emergency Use Authorization (EUA).  This EUA will remain in effect (meaning this test can be used) for the duration of  the COVID-19 declaration under Section 564(b)(1) of the A ct, 21 U.S.C. section 360bbb-3(b)(1), unless the authorization is terminated or revoked sooner.     Influenza A by PCR NEGATIVE NEGATIVE Final   Influenza B by PCR NEGATIVE NEGATIVE Final    Comment: (NOTE) The Xpert Xpress SARS-CoV-2/FLU/RSV plus assay is intended as an aid in the diagnosis of influenza from Nasopharyngeal swab specimens and should not be used as a sole basis for treatment. Nasal washings and aspirates are unacceptable for Xpert Xpress SARS-CoV-2/FLU/RSV testing.  Fact Sheet for Patients: BloggerCourse.com  Fact Sheet for Healthcare Providers: SeriousBroker.it  This test is not yet approved or cleared by the [place] FDA and has been authorized for detection and/or diagnosis of SARS-CoV-2 by FDA under an Emergency Use Authorization (EUA). This EUA will remain in effect (meaning this test can be used) for the duration of the COVID-19  declaration under Section 564(b)(1) of the Act, 21 U.S.C. section 360bbb-3(b)(1), unless the authorization is terminated or revoked.     Resp Syncytial Virus by PCR NEGATIVE NEGATIVE Final    Comment: (NOTE) Fact Sheet for Patients: BloggerCourse.com  Fact Sheet for Healthcare Providers: SeriousBroker.it  This test is not yet approved or cleared by the [place] FDA and has been authorized for detection and/or diagnosis of SARS-CoV-2 by FDA under an Emergency Use Authorization (EUA). This EUA will remain in effect (meaning this test can be used) for the duration of the COVID-19 declaration under Section 564(b)(1) of the Act, 21 U.S.C. section 360bbb-3(b)(1), unless the authorization is terminated or revoked.  Performed at [facility], [address], [place], [place] [address]   Urine Culture (for pregnant, neutropenic or urologic patients or patients with an indwelling urinary catheter)     Status: Abnormal   Collection Time: 09/29/23  3:24 PM   Specimen: Urine, Clean Catch  Result Value Ref Range Status   Specimen Description   Final    URINE, CLEAN CATCH Performed at [facility], [address] [place], [place] [address]    Special Requests   Final    NONE Performed at [facility], [address] [place], [place] [address]    Culture MULTIPLE SPECIES PRESENT, SUGGEST RECOLLECTION (A)  Final   Report Status 10/02/2023 FINAL  Final  C Difficile Quick Screen w PCR reflex     Status: None  Collection Time: 10/01/23 11:15 AM   Specimen: STOOL  Result Value Ref Range Status   C Diff antigen NEGATIVE NEGATIVE Final   C Diff toxin NEGATIVE NEGATIVE Final   C Diff interpretation No C. difficile detected.  Final    Comment: Performed at [facility], [address] [place], [place] [address]  Gastrointestinal Panel by PCR , Stool     Status: None   Collection Time: 10/02/23 10:15 AM   Specimen: Stool  Result Value Ref  Range Status   Campylobacter species NOT DETECTED NOT DETECTED Final   Plesimonas shigelloides NOT DETECTED NOT DETECTED Final   Salmonella species NOT DETECTED NOT DETECTED Final   Yersinia enterocolitica NOT DETECTED NOT DETECTED Final   Vibrio species NOT DETECTED NOT DETECTED Final   Vibrio cholerae NOT DETECTED NOT DETECTED Final   Enteroaggregative E coli (EAEC) NOT DETECTED NOT DETECTED Final   Enteropathogenic E coli (EPEC) NOT DETECTED NOT DETECTED Final   Enterotoxigenic E coli (ETEC) NOT DETECTED NOT DETECTED Final   Shiga like toxin producing E coli (STEC) NOT DETECTED NOT DETECTED Final   Shigella/Enteroinvasive E coli (EIEC) NOT DETECTED NOT DETECTED Final   Cryptosporidium NOT DETECTED NOT DETECTED Final   Cyclospora cayetanensis NOT DETECTED NOT DETECTED Final   Entamoeba histolytica NOT DETECTED NOT DETECTED Final   Giardia lamblia NOT DETECTED NOT DETECTED Final   Adenovirus F40/41 NOT DETECTED NOT DETECTED Final   Astrovirus NOT DETECTED NOT DETECTED Final   Norovirus GI/GII NOT DETECTED NOT DETECTED Final   Rotavirus A NOT DETECTED NOT DETECTED Final   Sapovirus (I, II, IV, and V) NOT DETECTED NOT DETECTED Final    Comment: Performed at [facility], [address] [place], [place] [address]    Labs: CBC: Recent Labs  Lab 09/29/23 0928 09/30/23 0425 10/02/23 0447 10/03/23 0436  WBC 29.9* 25.6* 16.1* 14.9*  NEUTROABS 26.9*  --   --   --   HGB 10.9* 10.9* 9.0* 9.6*  HCT 33.6* 33.0* 28.2* 29.8*  MCV 91.8 89.7 89.5 88.7  PLT 161 188 199 185   Basic Metabolic Panel: Recent Labs  Lab 09/29/23 0928 09/30/23 0425 10/02/23 0447  NA 134* 134* 130*  K 2.8* 3.2* 3.5  CL 96* 97* 94*  CO2 24 23 23   GLUCOSE 116* 142* 321*  BUN 37* 49* 67*  CREATININE 4.51* 4.90* 4.77*  CALCIUM 8.6* 8.6* 8.4*  MG 1.8  1.8 1.9  --   PHOS 5.1*  --   --    Liver Function Tests: Recent Labs  Lab 09/29/23 0928 10/02/23 0447  AST 37 33  ALT 28 32  ALKPHOS 105  107  BILITOT 1.0 0.5  PROT 6.4* 5.8*  ALBUMIN 2.7* 2.3*   CBG: Recent Labs  Lab 10/02/23 0726 10/02/23 1112 10/02/23 1646 10/02/23 2242 10/03/23 0735  GLUCAP 327* 412* 389* 127* 146*    Discharge time spent: greater than 30 minutes.  Signed: Catarina Hartshorn, MD [facility] 10/03/2023

## 2023-10-03 NOTE — Plan of Care (Signed)
  Problem: Acute Rehab PT Goals(only PT should resolve) Goal: Pt Will Go Supine/Side To Sit Outcome: Progressing Flowsheets (Taken 10/03/2023 1620) Pt will go Supine/Side to Sit:  Independently  with modified independence Goal: Patient Will Transfer Sit To/From Stand Outcome: Progressing Flowsheets (Taken 10/03/2023 1620) Patient will transfer sit to/from stand: with modified independence Goal: Pt Will Transfer Bed To Chair/Chair To Bed Outcome: Progressing Flowsheets (Taken 10/03/2023 1620) Pt will Transfer Bed to Chair/Chair to Bed:  with supervision  with contact guard assist Goal: Pt Will Ambulate Outcome: Progressing Flowsheets (Taken 10/03/2023 1620) Pt will Ambulate:  75 feet  with minimal assist  with rolling walker   4:20 PM, 10/03/23 Ocie Bob, MPT Physical Therapist with [facility] [phone] office [phone] mobile phone

## 2023-10-03 NOTE — Progress Notes (Addendum)
Responded to nursing call:  Pt with episode of low b/p into 70's systolic (manually) with associated c/o lightheadedness, unable to see and pale skin    Subjective: I came to bedside to eval patient.  He is resting with eyes closed when I walked in room.  Aroused easily to voice.   Denies f/c, cp, sob, n/v.  Complains of of some RUQ area pain, same as previous, but states it is better now.    Vitals:   10/03/23 1325 10/03/23 1336 10/03/23 1400 10/03/23 1421  BP: (!) 72/40 (!) 104/54 (!) 98/54 100/64  Pulse: 72 72 72 78  Resp: 16 16 16 16   Temp:      TempSrc:      SpO2:  98% 99% 99%  Weight:      Height:      General--A&O x 3 CV--RRR Lung--fine bibasilar crackles.  No wheeze Abd--soft+BS/NT   Assessment/Plan: Episode of transient low BP with dizziness -hold metoprolol -VQ scan -CT chest -CT abd/pelvis -cancel discharge -blood cultures x 2 -minimize opioids -troponins -personally reviewed EKG--sinus, nonspecific T wave change last b/p 98/58 manually, HR 72/min, saO2 100% RA, RR 18/min  -discussed with HD RN--delay HD till 12/14    Catarina Hartshorn, DO [facility]  Total time 25 min in addition to the 36 min spent earlier

## 2023-10-03 NOTE — Progress Notes (Addendum)
Patient ID: Philip Benjamin, male   DOB: [birth year], 72 y.o.   MRN: [id number]  Outpatient HD orders: [facility], MWF. 4 hours. EDW 90kg. TDC (has appt coming up with [facility] on 10/07/23 for perm access consideration). Flow rates: 350/500. 2K, 2.5Ca. Heparin: 1000 units/hr. Meds: Mircera every 4 weeks (last dose 09/15/23), Venofer 50mg  every Monday.  Assessment/Plan:  Covid-19 - per primary; on RA currently. Being treated for concurrent bacterial PNA with abx per primary Diarrhea - c diff neg, stool studies pending still ESRD - pt did not want HD Wed due to ongoing diarrhea and low bp.  Had HD Thursday PM. Will plan next HD tomorrow (Sat) then resume MWF schedule Mon UNLESS for d/c today will do usual Friday tx.  Pt agreeable.    HTN/volume - normotensive, no edema.   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  Phos stable.   Will continue to follow patient if he remains admitted, likely remotely over the weekend.  If any acute issues arise please reach out to the covering MD directly to alert.  We're happy to help.   S: Tolerated HD last PM fine - 1L UF as planned. Feeling weak this AM but no new focal complaints.  On RA.  Says diarrhea x 1 yesterday which is an improvement.    O:BP 102/67   Pulse 94   Temp 98.1 F (36.7 C) (Oral)   Resp 16   Ht 6\' 1"  (1.854 m)   Wt 87.2 kg   SpO2 99%   BMI 25.36 kg/m   Intake/Output Summary (Last 24 hours) at 10/03/2023 0929 Last data filed at 10/02/2023 2245 Gross per 24 hour  Intake 240 ml  Output 1150 ml  Net -910 ml   Intake/Output: I/O last 3 completed shifts: In: 940 [P.O.:840; IV Piggyback:100] Out: 1150 [Urine:150; Other:1000]  Intake/Output this shift:  No intake/output data recorded. Weight change:  Gen: NAD but fatigued apearing CVS: RRR Resp: CTA Abd: +BS, soft, NT/ND Ext: no edema Access: RIJ TC  Recent Labs  Lab 09/29/23 0928 09/30/23 0425 10/02/23 0447  NA 134* 134* 130*  K 2.8*  3.2* 3.5  CL 96* 97* 94*  CO2 24 23 23   GLUCOSE 116* 142* 321*  BUN 37* 49* 67*  CREATININE 4.51* 4.90* 4.77*  ALBUMIN 2.7*  --  2.3*  CALCIUM 8.6* 8.6* 8.4*  PHOS 5.1*  --   --   AST 37  --  33  ALT 28  --  32   Liver Function Tests: Recent Labs  Lab 09/29/23 0928 10/02/23 0447  AST 37 33  ALT 28 32  ALKPHOS 105 107  BILITOT 1.0 0.5  PROT 6.4* 5.8*  ALBUMIN 2.7* 2.3*   No results for input(s): "LIPASE", "AMYLASE" in the last 168 hours. No results for input(s): "AMMONIA" in the last 168 hours. CBC: Recent Labs  Lab 09/29/23 0928 09/30/23 0425 10/02/23 0447 10/03/23 0436  WBC 29.9* 25.6* 16.1* 14.9*  NEUTROABS 26.9*  --   --   --   HGB 10.9* 10.9* 9.0* 9.6*  HCT 33.6* 33.0* 28.2* 29.8*  MCV 91.8 89.7 89.5 88.7  PLT 161 188 199 185   Cardiac Enzymes: No results for input(s): "CKTOTAL", "CKMB", "CKMBINDEX", "TROPONINI" in the last 168 hours. CBG: Recent Labs  Lab 10/02/23 0726 10/02/23 1112 10/02/23 1646 10/02/23 2242 10/03/23 0735  GLUCAP 327* 412* 389* 127* 146*    Iron Studies:  Recent Labs  10/03/23 0436  FERRITIN 523*   Studies/Results: No results found.  azithromycin  500 mg Oral Daily   calcitRIOL  0.25 mcg Oral Daily   Chlorhexidine Gluconate Cloth  6 each Topical Daily   Chlorhexidine Gluconate Cloth  6 each Topical Q0600   cycloSPORINE  1 drop Both Eyes BID   dorzolamide-timolol  1 drop Both Eyes BID   FLUoxetine  20 mg Oral QHS   folic acid  1,000 mcg Oral Daily   heparin  2,000 Units Dialysis Once in dialysis   heparin  5,000 Units Subcutaneous Q8H   insulin aspart  0-5 Units Subcutaneous QHS   insulin aspart  0-9 Units Subcutaneous TID WC   insulin aspart  4 Units Subcutaneous TID WC   latanoprost  1 drop Left Eye QHS   metoprolol tartrate  12.5 mg Oral BID   multivitamin  1 tablet Oral QHS   pravastatin  80 mg Oral Daily   sodium chloride flush  3 mL Intravenous Q12H   tamsulosin  0.4 mg Oral Q supper    BMET     Component Value Date/Time   NA 130 (L) 10/02/2023 0447   K 3.5 10/02/2023 0447   CL 94 (L) 10/02/2023 0447   CO2 23 10/02/2023 0447   GLUCOSE 321 (H) 10/02/2023 0447   BUN 67 (H) 10/02/2023 0447   CREATININE 4.77 (H) 10/02/2023 0447   CALCIUM 8.4 (L) 10/02/2023 0447   GFRNONAA 12 (L) 10/02/2023 0447   GFRAA 44 (L) 01/14/2018 1112   CBC    Component Value Date/Time   WBC 14.9 (H) 10/03/2023 0436   RBC 3.36 (L) 10/03/2023 0436   HGB 9.6 (L) 10/03/2023 0436   HCT 29.8 (L) 10/03/2023 0436   PLT 185 10/03/2023 0436   MCV 88.7 10/03/2023 0436   MCH 28.6 10/03/2023 0436   MCHC 32.2 10/03/2023 0436   RDW 12.3 10/03/2023 0436   LYMPHSABS 0.7 09/29/2023 0928   MONOABS 2.0 (H) 09/29/2023 0928   EOSABS 0.0 09/29/2023 0928   BASOSABS 0.1 09/29/2023 7829

## 2023-10-03 NOTE — Progress Notes (Signed)
Physical Therapy Treatment Patient Details Name: DORRELL ROTHSTEIN MRN: [id number] DOB: 1951-02-14 Today's Date: 10/03/2023   History of Present Illness 72 year old male with a history of hypertension, diabetes mellitus, hyperlipidemia, newly diagnosed ESRD, and anemia of CKD     The patient was recently hospitalized from 08/14/2023-08/20/2023 where he was declared new ESRD and initiated on dialysis. TDC was placed on 08/15/2023, and the patient was initiated on HD.       secondary to general malaise, nausea and weakness; patient has also expressed intermittent nonproductive coughing spells and mild shortness of breath.  Patient denies chills, fever, overt bleeding, focal weaknesses, headache or any other complaints.     Of note, patient has been released from the skilled nursing facility about 1 week prior to this hospitalization.     Workup in the ED demonstrated positive COVID-19 infection, mild dehydration and hypokalemia.    PT Comments  Patient agreeable for therapy and c/o severe right flank pain due possibly due to coughing episodes prior to COVID symptoms getting better, otherwise is not having coughing episodes at the present.  Patient very unsteady on feet and limited to taking a few side steps at bedside due to c/o weakness and patient fearful of falling and unable to take steps away from bedside.  Patient tolerated sitting up in chair after therapy.  Patient will benefit from continued skilled physical therapy in hospital and recommended venue below to increase strength, balance, endurance for safe ADLs and gait.     If plan is discharge home, recommend the following: A lot of help with walking and/or transfers;A little help with bathing/dressing/bathroom;Help with stairs or ramp for entrance;Assistance with cooking/housework   Can travel by private vehicle     Yes  Equipment Recommendations  None recommended by PT    Recommendations for Other Services       Precautions /  Restrictions Precautions Precautions: Fall Restrictions Weight Bearing Restrictions Per Provider Order: No     Mobility  Bed Mobility Overal bed mobility: Modified Independent             General bed mobility comments: increased time with c/o left flank pain    Transfers Overall transfer level: Needs assistance Equipment used: Rolling walker (2 wheels) Transfers: Sit to/from Stand, Bed to chair/wheelchair/BSC Sit to Stand: Mod assist   Step pivot transfers: Mod assist       General transfer comment: unsteady labored movement with difficulty maintaining standing balance    Ambulation/Gait Ambulation/Gait assistance: Mod assist Gait Distance (Feet): 5 Feet Assistive device: Rolling walker (2 wheels) Gait Pattern/deviations: Decreased step length - right, Decreased step length - left, Decreased stride length Gait velocity: slow     General Gait Details: limited to a few slow labored side steps before having to sit due to near loss of balance and fatigue   Stairs             Wheelchair Mobility     Tilt Bed    Modified Rankin (Stroke Patients Only)       Balance Overall balance assessment: Needs assistance Sitting-balance support: Feet supported, No upper extremity supported Sitting balance-Leahy Scale: Fair Sitting balance - Comments: fair/good seated at EOB   Standing balance support: Reliant on assistive device for balance, During functional activity, Bilateral upper extremity supported Standing balance-Leahy Scale: Poor Standing balance comment: fair/poor using RW  Cognition Arousal: Alert Behavior During Therapy: WFL for tasks assessed/performed Overall Cognitive Status: Within Functional Limits for tasks assessed                                          Exercises General Exercises - Lower Extremity Long Arc Quad: Seated, AROM, Strengthening, Both, 10 reps Hip Flexion/Marching:  Seated, AROM, Strengthening, Both, 10 reps Toe Raises: Seated, AROM, Strengthening, Both, 10 reps Heel Raises: Seated, AROM, Strengthening, Both, 10 reps    General Comments        Pertinent Vitals/Pain Pain Assessment Pain Assessment: Faces Faces Pain Scale: Hurts even more Pain Location: right flank due to coughing per patient Pain Descriptors / Indicators: Sore, Sharp, Grimacing Pain Intervention(s): Limited activity within patient's tolerance, Monitored during session, Repositioned    Home Living                          Prior Function            PT Goals (current goals can now be found in the care plan section) Acute Rehab PT Goals Patient Stated Goal: return home PT Goal Formulation: With patient Time For Goal Achievement: 10/16/23 Potential to Achieve Goals: Good    Frequency    Min 3X/week      PT Plan      Co-evaluation              AM-PAC PT "6 Clicks" Mobility   Outcome Measure  Help needed turning from your back to your side while in a flat bed without using bedrails?: None Help needed moving from lying on your back to sitting on the side of a flat bed without using bedrails?: None Help needed moving to and from a bed to a chair (including a wheelchair)?: A Lot Help needed standing up from a chair using your arms (e.g., wheelchair or bedside chair)?: A Lot Help needed to walk in hospital room?: A Lot Help needed climbing 3-5 steps with a railing? : A Lot 6 Click Score: 16    End of Session   Activity Tolerance: Patient tolerated treatment well;Patient limited by fatigue Patient left: in chair;with call bell/phone within reach Nurse Communication: Mobility status PT Visit Diagnosis: Unsteadiness on feet (R26.81);Other abnormalities of gait and mobility (R26.89);Muscle weakness (generalized) (M62.81)     Time: 8295-6213 PT Time Calculation (min) (ACUTE ONLY): 24 min  Charges:    $Therapeutic Exercise: 8-22 mins $Therapeutic  Activity: 8-22 mins PT General Charges $$ ACUTE PT VISIT: 1 Visit                     12:38 PM, 10/03/23 Ocie Bob, MPT Physical Therapist with [facility] [phone] office [phone] mobile phone

## 2023-10-04 DIAGNOSIS — K810 Acute cholecystitis: Secondary | ICD-10-CM | POA: Diagnosis not present

## 2023-10-04 DIAGNOSIS — N186 End stage renal disease: Secondary | ICD-10-CM | POA: Diagnosis not present

## 2023-10-04 DIAGNOSIS — E1165 Type 2 diabetes mellitus with hyperglycemia: Secondary | ICD-10-CM | POA: Diagnosis not present

## 2023-10-04 DIAGNOSIS — U071 COVID-19: Secondary | ICD-10-CM | POA: Diagnosis not present

## 2023-10-04 LAB — CBC
HCT: 32.5 % — ABNORMAL LOW (ref 39.0–52.0)
HCT: 28.2 % — ABNORMAL LOW (ref 39.0–52.0)
Hemoglobin: 10.5 g/dL — ABNORMAL LOW (ref 13.0–17.0)
Hemoglobin: 9.4 g/dL — ABNORMAL LOW (ref 13.0–17.0)
MCH: 29 pg (ref 26.0–34.0)
MCH: 29.4 pg (ref 26.0–34.0)
MCHC: 32.3 g/dL (ref 30.0–36.0)
MCHC: 33.3 g/dL (ref 30.0–36.0)
MCV: 89.8 fL (ref 80.0–100.0)
MCV: 88.1 fL (ref 80.0–100.0)
Platelets: 178 10*3/uL (ref 150–400)
Platelets: 181 10*3/uL (ref 150–400)
RBC: 3.62 MIL/uL — ABNORMAL LOW (ref 4.22–5.81)
RBC: 3.2 MIL/uL — ABNORMAL LOW (ref 4.22–5.81)
RDW: 12.5 % (ref 11.5–15.5)
RDW: 12.8 % (ref 11.5–15.5)
WBC: 23.4 10*3/uL — ABNORMAL HIGH (ref 4.0–10.5)
WBC: 23.1 10*3/uL — ABNORMAL HIGH (ref 4.0–10.5)
nRBC: 0 % (ref 0.0–0.2)
nRBC: 0 % (ref 0.0–0.2)

## 2023-10-04 LAB — GLUCOSE, CAPILLARY
Glucose-Capillary: 160 mg/dL — ABNORMAL HIGH (ref 70–99)
Glucose-Capillary: 131 mg/dL — ABNORMAL HIGH (ref 70–99)
Glucose-Capillary: 68 mg/dL — ABNORMAL LOW (ref 70–99)
Glucose-Capillary: 72 mg/dL (ref 70–99)
Glucose-Capillary: 185 mg/dL — ABNORMAL HIGH (ref 70–99)

## 2023-10-04 LAB — RENAL FUNCTION PANEL
Albumin: 2.2 g/dL — ABNORMAL LOW (ref 3.5–5.0)
Anion gap: 11 (ref 5–15)
BUN: 67 mg/dL — ABNORMAL HIGH (ref 8–23)
CO2: 24 mmol/L (ref 22–32)
Calcium: 8.5 mg/dL — ABNORMAL LOW (ref 8.9–10.3)
Chloride: 97 mmol/L — ABNORMAL LOW (ref 98–111)
Creatinine, Ser: 5.1 mg/dL — ABNORMAL HIGH (ref 0.61–1.24)
GFR, Estimated: 11 mL/min — ABNORMAL LOW (ref >60)
Glucose, Bld: 85 mg/dL (ref 70–99)
Phosphorus: 3.3 mg/dL (ref 2.5–4.6)
Potassium: 3 mmol/L — ABNORMAL LOW (ref 3.5–5.1)
Sodium: 132 mmol/L — ABNORMAL LOW (ref 135–145)

## 2023-10-04 LAB — FERRITIN: Ferritin: 579 ng/mL — ABNORMAL HIGH (ref 24–336)

## 2023-10-04 LAB — HEPATIC FUNCTION PANEL
ALT: 61 U/L — ABNORMAL HIGH (ref 0–44)
AST: 47 U/L — ABNORMAL HIGH (ref 15–41)
Albumin: 2.2 g/dL — ABNORMAL LOW (ref 3.5–5.0)
Alkaline Phosphatase: 105 U/L (ref 38–126)
Bilirubin, Direct: 0.2 mg/dL (ref 0.0–0.2)
Indirect Bilirubin: 0.3 mg/dL (ref 0.3–0.9)
Total Bilirubin: 0.5 mg/dL (ref <1.2)
Total Protein: 5.7 g/dL — ABNORMAL LOW (ref 6.5–8.1)

## 2023-10-04 LAB — C-REACTIVE PROTEIN: CRP: 14 mg/dL — ABNORMAL HIGH (ref <1.0)

## 2023-10-04 MED ORDER — INSULIN ASPART 100 UNIT/ML IJ SOLN
0.0000 [IU] | Freq: Three times a day (TID) | INTRAMUSCULAR | Status: DC
  Administered 2023-10-05: 4 [IU] via SUBCUTANEOUS
  Administered 2023-10-05: 3 [IU] via SUBCUTANEOUS
  Administered 2023-10-05: 2 [IU] via SUBCUTANEOUS
  Administered 2023-10-06: 1 [IU] via SUBCUTANEOUS
  Administered 2023-10-06: 3 [IU] via SUBCUTANEOUS
  Administered 2023-10-07: 1 [IU] via SUBCUTANEOUS
  Administered 2023-10-07: 2 [IU] via SUBCUTANEOUS
  Administered 2023-10-07: 4 [IU] via SUBCUTANEOUS
  Administered 2023-10-08: 1 [IU] via SUBCUTANEOUS
  Administered 2023-10-09: 2 [IU] via SUBCUTANEOUS
  Administered 2023-10-09: 3 [IU] via SUBCUTANEOUS

## 2023-10-04 MED ORDER — PIPERACILLIN-TAZOBACTAM IN DEX 2-0.25 GM/50ML IV SOLN
2.2500 g | Freq: Three times a day (TID) | INTRAVENOUS | Status: DC
  Administered 2023-10-05 – 2023-10-06 (×5): 2.25 g via INTRAVENOUS
  Filled 2023-10-04 (×2): qty 2.25
  Filled 2023-10-04 (×5): qty 50
  Filled 2023-10-04: qty 2.25
  Filled 2023-10-04: qty 50

## 2023-10-04 NOTE — Progress Notes (Signed)
Pt tolerated tx well and goal met.  10/04/23 1820  Vitals  Temp 98.4 F (36.9 C)  Temp Source Axillary  BP 118/81  BP Location Right Arm  BP Method Automatic  Patient Position (if appropriate) Sitting  Pulse Rate 82  Resp 18  Oxygen Therapy  SpO2 100 %  O2 Device Room Air  During Treatment Monitoring  Intra-Hemodialysis Comments Tx completed  Post Treatment  Dialyzer Clearance Lightly streaked  Hemodialysis Intake (mL) 0 mL  Liters Processed 72  Fluid Removed (mL) 1000 mL  Tolerated HD Treatment Yes  Post-Hemodialysis Comments Pt goal met.  Hemodialysis Catheter Right Subclavian Double lumen Permanent (Tunneled)  Placement Date/Time: 08/15/23 0820   Serial / Lot #: [id number]  Expiration Date: 04/27/28  Time Out: Correct patient;Correct site;Correct procedure  Maximum sterile barrier precautions: Hand hygiene;Cap;Mask;Sterile gown;Sterile gloves;Large sterile ...  Site Condition No complications  Blue Lumen Status Heparin locked  Red Lumen Status Heparin locked  Catheter fill solution Heparin 1000 units/ml  Catheter fill volume (Arterial) 1.9 cc  Catheter fill volume (Venous) 1.9  Dressing Type Gauze/Drain sponge;Transparent  Dressing Status Antimicrobial disc in place;Clean, Dry, Intact  Interventions New dressing  Drainage Description None  Dressing Change Due 10/07/23  Post treatment catheter status Capped and Clamped

## 2023-10-04 NOTE — Progress Notes (Signed)
PROGRESS NOTE  Philip Benjamin WUJ:[id number] DOB: May 29, 1951 DOA: 09/29/2023 PCP: Kirstie Peri, MD  Brief History:  72 year old male with a history of hypertension, diabetes mellitus, hyperlipidemia, newly diagnosed ESRD, and anemia of CKD  The patient was recently hospitalized from 08/14/2023-08/20/2023 where he was declared new ESRD and initiated on dialysis. TDC was placed on 08/15/2023, and the patient was initiated on HD.    Pt is admitted secondary to general malaise, nausea and weakness; patient has also expressed intermittent nonproductive coughing spells and mild shortness of breath.  Patient denies chills, fever, overt bleeding, focal weaknesses, headache or any other complaints.   Of note, patient has been released from the skilled nursing facility about 1 week prior to this hospitalization.   Workup in the ED demonstrated positive COVID-19 infection, mild dehydration and hypokalemia.   TRH has been consulted to place in the hospital for further evaluation and management.  The patient also had a recent hospital admission from 08/21/2023 to 08/25/2023 when he was treated for sepsis secondary to Pseudomonas UTI and lobar pneumonia.  He was treated with IV cefepime, and discharged with Cipro for 2 additional doses.  The patient was placed on intravenous steroids for his COVID 19 infection.  His PCT was 2.05.  As result, the patient was initially started on ceftriaxone and azithromycin for presumptive superimposed pneumonia.  He gradually improved, but WBC increased.  UA suggested significant pyuria with WBC>50.  His ceftriaxone was switched to cefepime given his previous Pseudomonas in the urine.  The patient remained afebrile and hemodynamically stable.  Unfortunately, he began developing right upper quadrant abdominal pain with increase in WBC despite being on antibiotics.  CT of the abdomen and pelvis was performed and showed distended gallbladder with pericholecystic  stranding and loculated pericholecystic fluid.  There was concern for pericholecystic abscess.  General surgery was consulted.  IR was also subsequently consulted for percutaneous cholecystotomy tube and drainage of the abscess.   Assessment/Plan: COVID 19 Pneumonia -stable on RA -presented with intermittent nonproductive coughing spells and just mild short winded sensation with activity. -Continue supportive care, bronchodilators and steroids -CRP 32.9>>9.6>>6.1 -Patient was started on steroids a the time of admission>>last dose 10/02/23   pneumonia -Patient with elevated leukocytosis and also elevated procalcitonin -PCT 2.05 -initially ceftriaxone and azithro -Continue mucolytic's, flutter valve and bronchodilator as mentioned above -ceftriaxone to cefepime 10/01/23 -stable on RA -received 5 days azithro -Will have completed full course of therapy with ceftriaxone>> cefepime and azithromycin  Cholecystitis -10/03/2023 CT abdomen--distended gallbladder with pericholecystic stranding and loculated pericholecystic fluid in the right subhepatic space -General Surgery consulted, case discussed with Dr. Robyne Peers -Patient remains afebrile hemodynamically stable with minimal right upper quadrant pain -Consult IR for cholecystotomy tube and drainage of abscess   ESRD -Appreciate nephrology -last HD 10/04/23 -usually on MWF   Pyuria/UTI -sent urine culture 12/11--multiple organisms -startef empiric cefepime as pt had pseudomonas in urine last hospitalization -will give cefepime 2 grams on HD 12/13--pt will have had 5 days total lasting till his next HD   Diabetes mellitus type 2 -08/14/2023 hemoglobin A1c 5.6 -Continue NovoLog sliding scale -Continue reduced dose Semglee during hospitalization -CBGs elevated due to steroids>>now hypoglycemic -change to sensitive sliding scale   hypokalemia -Repleted -Continue to follow electrolytes -correction on HD    hypertension/hyperlipidemia -Continue statins and metoprolol -12/14--now hold statin with mild LFT elevation   BPH -Continue Flomax.   increased intraocular pressure/glaucoma -Continue the use of  latanoprost and also dorzolamide/timolol. -Continue outpatient follow-up with ophthalmology service.   depression/anxiety -Continue as needed Xanax and the use of Prozac.   generalized weakness -Most likely in the setting of pneumonia/COVID -PT evaluation>>SNF   anemia of chronic kidney disease -Stable hemoglobin - Hgb stable in 9 range, no bleeding. On ESA per nephrology    Diarrhea -c diff neg -check stool pathogen panel--pending -prn loperamide -due to COVID-19 -improving       Family Communication:   Family at bedside  Consultants:  renal, general surgery  Code Status:  FULL   DVT Prophylaxis:  [place] Heparin /  Procedures: As Listed in Progress Note Above  Antibiotics: Azithro 12/9>>12/14 Cefepime 12/11>>12/14 Ceftriaxone 12/9>>12/10 Zosyn 12/15>>     Subjective:   Objective: Vitals:   10/04/23 1520 10/04/23 1530 10/04/23 1600 10/04/23 1630  BP: 122/71 133/80 121/72 115/76  Pulse: 80 78 78 76  Resp: 16 18 18    Temp:      TempSrc:      SpO2:      Weight:      Height:        Intake/Output Summary (Last 24 hours) at 10/04/2023 1738 Last data filed at 10/04/2023 1212 Gross per 24 hour  Intake 100 ml  Output --  Net 100 ml   Weight change:  Exam:  General:  Pt is alert, follows commands appropriately, not in acute distress HEENT: No icterus, No thrush, No neck mass, [place]/AT Cardiovascular: RRR, S1/S2, no rubs, no gallops Respiratory: fine bibasilar rales. No wheeze Abdomen: Soft/+BS, minimal RUQ tender, non distended, no guarding Extremities: No edema, No lymphangitis, No petechiae, No rashes, no synovitis   Data Reviewed: I have personally reviewed following labs and imaging studies Basic Metabolic Panel: Recent Labs  Lab 09/29/23 0928  09/30/23 0425 10/02/23 0447 10/04/23 1515  NA 134* 134* 130* 132*  K 2.8* 3.2* 3.5 3.0*  CL 96* 97* 94* 97*  CO2 24 23 23 24   GLUCOSE 116* 142* 321* 85  BUN 37* 49* 67* 67*  CREATININE 4.51* 4.90* 4.77* 5.10*  CALCIUM 8.6* 8.6* 8.4* 8.5*  MG 1.8  1.8 1.9  --   --   PHOS 5.1*  --   --  3.3   Liver Function Tests: Recent Labs  Lab 09/29/23 0928 10/02/23 0447 10/04/23 1515  AST 37 33 47*  ALT 28 32 61*  ALKPHOS 105 107 105  BILITOT 1.0 0.5 0.5  PROT 6.4* 5.8* 5.7*  ALBUMIN 2.7* 2.3* 2.2*  2.2*   No results for input(s): "LIPASE", "AMYLASE" in the last 168 hours. No results for input(s): "AMMONIA" in the last 168 hours. Coagulation Profile: No results for input(s): "INR", "PROTIME" in the last 168 hours. CBC: Recent Labs  Lab 09/29/23 0928 09/30/23 0425 10/02/23 0447 10/03/23 0436 10/04/23 0514 10/04/23 1515  WBC 29.9* 25.6* 16.1* 14.9* 23.4* 23.1*  NEUTROABS 26.9*  --   --   --   --   --   HGB 10.9* 10.9* 9.0* 9.6* 10.5* 9.4*  HCT 33.6* 33.0* 28.2* 29.8* 32.5* 28.2*  MCV 91.8 89.7 89.5 88.7 89.8 88.1  PLT 161 188 199 185 178 181   Cardiac Enzymes: No results for input(s): "CKTOTAL", "CKMB", "CKMBINDEX", "TROPONINI" in the last 168 hours. BNP: Invalid input(s): "POCBNP" CBG: Recent Labs  Lab 10/03/23 2025 10/04/23 0725 10/04/23 1047 10/04/23 1544 10/04/23 1607  GLUCAP 148* 160* 131* 68* 72   HbA1C: No results for input(s): "HGBA1C" in the last 72 hours. Urine analysis:  Component Value Date/Time   COLORURINE AMBER (A) 09/29/2023 1524   APPEARANCEUR CLOUDY (A) 09/29/2023 1524   LABSPEC 1.017 09/29/2023 1524   PHURINE 5.0 09/29/2023 1524   GLUCOSEU NEGATIVE 09/29/2023 1524   HGBUR SMALL (A) 09/29/2023 1524   BILIRUBINUR NEGATIVE 09/29/2023 1524   KETONESUR NEGATIVE 09/29/2023 1524   PROTEINUR 100 (A) 09/29/2023 1524   NITRITE NEGATIVE 09/29/2023 1524   LEUKOCYTESUR MODERATE (A) 09/29/2023 1524   Sepsis  Labs: @LABRCNTIP (procalcitonin:4,lacticidven:4) ) Recent Results (from the past 240 hours)  Resp panel by RT-PCR (RSV, Flu A&B, Covid) Anterior Nasal Swab     Status: Abnormal   Collection Time: 09/29/23 10:05 AM   Specimen: Anterior Nasal Swab  Result Value Ref Range Status   SARS Coronavirus 2 by RT PCR POSITIVE (A) NEGATIVE Final    Comment: (NOTE) SARS-CoV-2 target nucleic acids are DETECTED.  The SARS-CoV-2 RNA is generally detectable in upper respiratory specimens during the acute phase of infection. Positive results are indicative of the presence of the identified virus, but do not rule out bacterial infection or co-infection with other pathogens not detected by the test. Clinical correlation with patient history and other diagnostic information is necessary to determine patient infection status. The expected result is Negative.  Fact Sheet for Patients: BloggerCourse.com  Fact Sheet for Healthcare Providers: SeriousBroker.it  This test is not yet approved or cleared by the [place] FDA and  has been authorized for detection and/or diagnosis of SARS-CoV-2 by FDA under an Emergency Use Authorization (EUA).  This EUA will remain in effect (meaning this test can be used) for the duration of  the COVID-19 declaration under Section 564(b)(1) of the A ct, 21 U.S.C. section 360bbb-3(b)(1), unless the authorization is terminated or revoked sooner.     Influenza A by PCR NEGATIVE NEGATIVE Final   Influenza B by PCR NEGATIVE NEGATIVE Final    Comment: (NOTE) The Xpert Xpress SARS-CoV-2/FLU/RSV plus assay is intended as an aid in the diagnosis of influenza from Nasopharyngeal swab specimens and should not be used as a sole basis for treatment. Nasal washings and aspirates are unacceptable for Xpert Xpress SARS-CoV-2/FLU/RSV testing.  Fact Sheet for Patients: BloggerCourse.com  Fact Sheet for  Healthcare Providers: SeriousBroker.it  This test is not yet approved or cleared by the [place] FDA and has been authorized for detection and/or diagnosis of SARS-CoV-2 by FDA under an Emergency Use Authorization (EUA). This EUA will remain in effect (meaning this test can be used) for the duration of the COVID-19 declaration under Section 564(b)(1) of the Act, 21 U.S.C. section 360bbb-3(b)(1), unless the authorization is terminated or revoked.     Resp Syncytial Virus by PCR NEGATIVE NEGATIVE Final    Comment: (NOTE) Fact Sheet for Patients: BloggerCourse.com  Fact Sheet for Healthcare Providers: SeriousBroker.it  This test is not yet approved or cleared by the [place] FDA and has been authorized for detection and/or diagnosis of SARS-CoV-2 by FDA under an Emergency Use Authorization (EUA). This EUA will remain in effect (meaning this test can be used) for the duration of the COVID-19 declaration under Section 564(b)(1) of the Act, 21 U.S.C. section 360bbb-3(b)(1), unless the authorization is terminated or revoked.  Performed at [facility], [address], [place], [place] [address]   Urine Culture (for pregnant, neutropenic or urologic patients or patients with an indwelling urinary catheter)     Status: Abnormal   Collection Time: 09/29/23  3:24 PM   Specimen: Urine, Clean Catch  Result Value Ref Range  Status   Specimen Description   Final    URINE, CLEAN CATCH Performed at [facility], [address] [place], [place] [address]    Special Requests   Final    NONE Performed at [facility] [address] [place], [place] [address]    Culture MULTIPLE SPECIES PRESENT, SUGGEST RECOLLECTION (A)  Final   Report Status 10/02/2023 FINAL  Final  C Difficile Quick Screen w PCR reflex     Status: None   Collection Time: 10/01/23 11:15 AM   Specimen: STOOL  Result Value Ref Range Status    C Diff antigen NEGATIVE NEGATIVE Final   C Diff toxin NEGATIVE NEGATIVE Final   C Diff interpretation No C. difficile detected.  Final    Comment: Performed at [facility], [address], [place], [place] [address]  Gastrointestinal Panel by PCR , Stool     Status: None   Collection Time: 10/02/23 10:15 AM   Specimen: Stool  Result Value Ref Range Status   Campylobacter species NOT DETECTED NOT DETECTED Final   Plesimonas shigelloides NOT DETECTED NOT DETECTED Final   Salmonella species NOT DETECTED NOT DETECTED Final   Yersinia enterocolitica NOT DETECTED NOT DETECTED Final   Vibrio species NOT DETECTED NOT DETECTED Final   Vibrio cholerae NOT DETECTED NOT DETECTED Final   Enteroaggregative E coli (EAEC) NOT DETECTED NOT DETECTED Final   Enteropathogenic E coli (EPEC) NOT DETECTED NOT DETECTED Final   Enterotoxigenic E coli (ETEC) NOT DETECTED NOT DETECTED Final   Shiga like toxin producing E coli (STEC) NOT DETECTED NOT DETECTED Final   Shigella/Enteroinvasive E coli (EIEC) NOT DETECTED NOT DETECTED Final   Cryptosporidium NOT DETECTED NOT DETECTED Final   Cyclospora cayetanensis NOT DETECTED NOT DETECTED Final   Entamoeba histolytica NOT DETECTED NOT DETECTED Final   Giardia lamblia NOT DETECTED NOT DETECTED Final   Adenovirus F40/41 NOT DETECTED NOT DETECTED Final   Astrovirus NOT DETECTED NOT DETECTED Final   Norovirus GI/GII NOT DETECTED NOT DETECTED Final   Rotavirus A NOT DETECTED NOT DETECTED Final   Sapovirus (I, II, IV, and V) NOT DETECTED NOT DETECTED Final    Comment: Performed at [facility], [address] [place], [place] [address]  Culture, blood (Routine X 2) w Reflex to ID Panel     Status: None (Preliminary result)   Collection Time: 10/03/23  2:37 PM   Specimen: BLOOD  Result Value Ref Range Status   Specimen Description BLOOD RIGHT ANTECUBITAL  Final   Special Requests   Final    Blood Culture results may not be optimal due to an inadequate volume  of blood received in culture bottles BOTTLES DRAWN AEROBIC ONLY   Culture   Final    NO GROWTH < 24 HOURS Performed at [facility], [address], [place], [place] [address]    Report Status PENDING  Incomplete  Culture, blood (Routine X 2) w Reflex to ID Panel     Status: None (Preliminary result)   Collection Time: 10/03/23  2:45 PM   Specimen: BLOOD  Result Value Ref Range Status   Specimen Description BLOOD BLOOD LEFT HAND  Final   Special Requests   Final    Blood Culture results may not be optimal due to an inadequate volume of blood received in culture bottles BOTTLES DRAWN AEROBIC ONLY   Culture   Final    NO GROWTH < 24 HOURS Performed at [facility], [address], [place], [place] [address]  Report Status PENDING  Incomplete     Scheduled Meds:  azithromycin  500 mg Oral Daily   calcitRIOL  0.25 mcg Oral Daily   Chlorhexidine Gluconate Cloth  6 each Topical Daily   Chlorhexidine Gluconate Cloth  6 each Topical Q0600   cycloSPORINE  1 drop Both Eyes BID   dorzolamide-timolol  1 drop Both Eyes BID   FLUoxetine  20 mg Oral QHS   folic acid  1,000 mcg Oral Daily   heparin  2,000 Units Dialysis Once in dialysis   heparin  5,000 Units Subcutaneous Q8H   insulin aspart  0-5 Units Subcutaneous QHS   insulin aspart  0-9 Units Subcutaneous TID WC   latanoprost  1 drop Left Eye QHS   multivitamin  1 tablet Oral QHS   pravastatin  80 mg Oral Daily   sodium chloride flush  3 mL Intravenous Q12H   Continuous Infusions:  piperacillin-tazobactam (ZOSYN)  IV Stopped (10/04/23 1524)    Procedures/Studies: CT CHEST ABDOMEN PELVIS WO CONTRAST Result Date: 10/04/2023 CLINICAL DATA:  Upper abdominal pain, weakness, COVID pneumonia EXAM: CT CHEST, ABDOMEN AND PELVIS WITHOUT CONTRAST TECHNIQUE: Multidetector CT imaging of the chest, abdomen and pelvis was performed following the standard protocol without IV contrast. RADIATION DOSE REDUCTION: This exam was performed according to  the departmental dose-optimization program which includes automated exposure control, adjustment of the mA and/or kV according to patient size and/or use of iterative reconstruction technique. COMPARISON:  08/21/2023 FINDINGS: CT CHEST FINDINGS Cardiovascular: Extensive multi-vessel coronary artery calcification. Global cardiac size within normal limits. No pericardial effusion. Central pulmonary arteries are of normal caliber. Thoracic aorta is unremarkable. Right internal jugular hemodialysis catheter tip noted within the low right atrium at the hepatocaval junction. Mediastinum/Nodes: No enlarged mediastinal, hilar, or axillary lymph nodes. Thyroid gland, trachea, and esophagus demonstrate no significant findings. Lungs/Pleura: Left lower lobe chronic consolidation and volume loss is again identified with traction bronchiectasis. Platelike atelectasis has developed within the right lower lobe with marked volume loss. Traction bronchiectasis is noted suggesting some degree fibrotic change. Similar, though milder changes are noted within the basilar right middle lobe. No superimposed confluent pulmonary infiltrate. No pneumothorax or pleural effusion. Musculoskeletal: No chest wall mass or suspicious bone lesions identified. CT ABDOMEN PELVIS FINDINGS Hepatobiliary: The gallbladder is distended and there is pericholecystic inflammatory stranding in keeping with changes of acute cholecystitis. There is loculated pericholecystic fluid seen within the right subhepatic space along the peritoneal wall, new since prior examination. Gallbladder perforation and pericholecystic abscess formation is not excluded. This measures 5.5 x 2.1 cm at axial image # 72/5. The liver is unremarkable. No intra or extrahepatic biliary ductal dilation. Pancreas: Unremarkable Spleen: Unremarkable Adrenals/Urinary Tract: Adrenal glands are unremarkable. Kidneys are normal, without renal calculi, focal lesion, or hydronephrosis. Bladder is  unremarkable. Stomach/Bowel: Stomach is within normal limits. Appendix appears normal. No evidence of bowel wall thickening, distention, or inflammatory changes. Mild sigmoid diverticulosis. Vascular/Lymphatic: Aortic atherosclerosis. No enlarged abdominal or pelvic lymph nodes. Reproductive: Mild prostatic hypertrophy Other: Tiny fat containing umbilical hernia. No abdominopelvic ascites. Musculoskeletal: Osseous structures are age-appropriate. No acute bone abnormality. IMPRESSION: 1. Acute cholecystitis. Loculated pericholecystic fluid within the right subhepatic space along the peritoneal wall, new since prior examination. Gallbladder perforation and pericholecystic abscess formation is not excluded. Correlation with liver enzymes is recommended. 2. Extensive multi-vessel coronary artery calcification. 3. Chronic left lower lobe consolidation and volume loss with traction bronchiectasis. Interval development of platelike atelectasis within the right lower lobe with marked  volume loss. Similar, though milder changes are noted within the basilar right middle lobe. 4. Mild sigmoid diverticulosis. 5. Mild prostatic hypertrophy. Aortic Atherosclerosis (ICD10-I70.0). Electronically Signed   By: Helyn Numbers M.D.   On: 10/04/2023 00:08   NM Pulmonary Perfusion Result Date: 10/03/2023 CLINICAL DATA:  Chest pain, dyspnea, COVID-19 pneumonia EXAM: NUCLEAR MEDICINE PERFUSION LUNG SCAN TECHNIQUE: Perfusion images were obtained in multiple projections after intravenous injection of radiopharmaceutical. Ventilation scans intentionally deferred if perfusion scan and chest x-ray adequate for interpretation during COVID 19 epidemic. RADIOPHARMACEUTICALS:  4.4 mCi Tc-61m MAA IV COMPARISON:  Chest CT 4:34 p.m. FINDINGS: Bilateral lower lobe volume loss corresponds to atelectasis noted on accompanying chest CT. No focal perfusion defects are identified no extra pulmonary radiotracer uptake identified. IMPRESSION: No  scintigraphic evidence of acute pulmonary embolism. Electronically Signed   By: Helyn Numbers M.D.   On: 10/03/2023 20:44   DG Chest 2 View Result Date: 09/29/2023 CLINICAL DATA:  Weakness, nausea. EXAM: CHEST - 2 VIEW COMPARISON:  Same day. FINDINGS: Stable cardiomediastinal silhouette. Right internal jugular dialysis catheter is unchanged. Hypoinflation of the lungs is noted with probable bibasilar subsegmental atelectasis and possible small pleural effusions. Bony thorax is unremarkable. IMPRESSION: Hypoinflation of lungs with probable bibasilar subsegmental atelectasis and possible small pleural effusions. Electronically Signed   By: Lupita Raider M.D.   On: 09/29/2023 12:53   DG Chest Portable 1 View Result Date: 09/29/2023 CLINICAL DATA:  ?Pna Pt complains of generalized weakness, nausea, and loss of appetite x 1 week. Pt states he was discharged from SNF around a week ago EXAM: PORTABLE CHEST 1 VIEW COMPARISON:  Chest x-ray 08/14/2023 FINDINGS: Right chest wall dialysis catheter with tip overlying the right atrium. The heart and mediastinal contours are within normal limits. Low lung volumes. Bibasilar airspace opacities. No pulmonary edema. Likely trace bilateral pleural effusion. No pneumothorax. No acute osseous abnormality. IMPRESSION: 1. Low lung volumes with bibasilar airspace opacities that could represent a combination of atelectasis and/or infection/inflammation. Recommend repeat PA and lateral view with improved inspiratory effort for further evaluation. 2. Likely trace bilateral pleural effusions. Electronically Signed   By: Tish Frederickson M.D.   On: 09/29/2023 09:55    Catarina Hartshorn, DO  [facility]  If 7PM-7AM, please contact night-coverage [email or web address] Password [email or web address] 10/04/2023, 5:38 PM   LOS: 4 days

## 2023-10-04 NOTE — Progress Notes (Signed)
[facility]  Called by Dr. Arbutus Leas regarding this patient.  He was admitted to the hospital with COVID-pneumonia and a possible superimposed bacterial pneumonia on 12/9.  He had been improving over the last several days, but then yesterday, he complained of some upper abdominal pain and was noted to have some low blood pressures.  He underwent a CT of the chest abdomen and pelvis, and it demonstrated concern for acute cholecystitis with a loculated pericholecystic fluid within the right subhepatic space along the peritoneal wall, gallbladder perforation and pericholecystic abscess not excluded.  He had a bump in his leukocytosis from 14.9 to 23.1.  He had mild elevation of AST and ALT, but normal alkaline phosphatase and bilirubin.  He is currently hemodynamically stable with minimal abdominal pain per Dr. Arbutus Leas.  I will plan to see the patient tomorrow morning, but given his current admission for COVID-pneumonia, I do not feel this patient is a good surgical candidate at this time.  Discussed the case with Dr. Lovell Sheehan, and we both agree that patient should be evaluated by IR for percutaneous cholecystostomy tube and drainage of this pericholecystic abscess.  Patient will need cholecystectomy in the near future, but would like for him to be further out from his current pneumonia before removing his gallbladder.  If the patient clinically worsens or fails to improve after any IR procedure, then would likely plan to proceed with surgery at that time.  Antibiotic coverage has been broadened to cover anaerobes.  Please call with any questions or concerns.  Theophilus Kinds, DO [facility] [address] [place], [place] [address] [phone] (office)

## 2023-10-04 NOTE — TOC Progression Note (Signed)
Transition of Care [facility]) - Progression Note    Patient Details  Name: Philip Benjamin MRN: [id number] Date of Birth: 1950/10/29  Transition of Care [facility]) CM/SW Contact  Villa Herb, [place] Phone Number: 10/04/2023, 11:49 AM  Clinical Narrative:    CSW spoke with pts daughter to review bed offers. They would like to accept bed at [facility]. Due to [facility] will be able to accept pt on day 11 post positive. TOC to follow.   Expected Discharge Plan: Skilled Nursing Facility Barriers to Discharge: SNF Pending bed offer, Other (must enter comment)  Expected Discharge Plan and Services In-house Referral: Clinical Social Work   Post Acute Care Choice: Skilled Nursing Facility Living arrangements for the past 2 months: Single Family Home Expected Discharge Date: 10/03/23                         HH Arranged: PT, RN HH Agency: [facility] Date [facility] Agency Contacted: 09/30/23 Time HH Agency Contacted: [phone] Representative spoke with at [facility] Agency: Clydie Braun   Social Determinants of Health (SDOH) Interventions SDOH Screenings   Food Insecurity: No Food Insecurity (09/29/2023)  Housing: Patient Declined (09/29/2023)  Transportation Needs: No Transportation Needs (09/29/2023)  Utilities: Not At Risk (09/29/2023)  Tobacco Use: Low Risk  (09/29/2023)  Health Literacy: Medium Risk (09/05/2023)   Received from [facility]    Readmission Risk Interventions    10/01/2023    7:48 AM 08/22/2023   12:48 PM 08/15/2023   12:29 PM  Readmission Risk Prevention Plan  Transportation Screening Complete Complete Complete  Home Care Screening  Complete Complete  Medication Review (RN CM)  Complete Complete  HRI or Home Care Consult Complete    Social Work Consult for Recovery Care Planning/Counseling Complete    Palliative Care Screening Not Applicable    Medication Review Ocean[employer]) Complete

## 2023-10-04 NOTE — Plan of Care (Signed)
  Problem: Respiratory: Goal: Complications related to the disease process, condition or treatment will be avoided or minimized Outcome: Progressing   Problem: Clinical Measurements: Goal: Ability to maintain clinical measurements within normal limits will improve Outcome: Progressing   Problem: Activity: Goal: Risk for activity intolerance will decrease Outcome: Progressing   Problem: Pain Management: Goal: General experience of comfort will improve Outcome: Progressing   Problem: Safety: Goal: Ability to remain free from injury will improve Outcome: Progressing   Problem: Skin Integrity: Goal: Risk for impaired skin integrity will decrease Outcome: Progressing

## 2023-10-05 DIAGNOSIS — N186 End stage renal disease: Secondary | ICD-10-CM | POA: Diagnosis not present

## 2023-10-05 DIAGNOSIS — Z992 Dependence on renal dialysis: Secondary | ICD-10-CM | POA: Diagnosis not present

## 2023-10-05 DIAGNOSIS — U071 COVID-19: Secondary | ICD-10-CM | POA: Diagnosis not present

## 2023-10-05 DIAGNOSIS — K810 Acute cholecystitis: Secondary | ICD-10-CM | POA: Diagnosis not present

## 2023-10-05 LAB — COMPREHENSIVE METABOLIC PANEL
ALT: 64 U/L — ABNORMAL HIGH (ref 0–44)
AST: 48 U/L — ABNORMAL HIGH (ref 15–41)
Albumin: 2.2 g/dL — ABNORMAL LOW (ref 3.5–5.0)
Alkaline Phosphatase: 101 U/L (ref 38–126)
Anion gap: 11 (ref 5–15)
BUN: 45 mg/dL — ABNORMAL HIGH (ref 8–23)
CO2: 25 mmol/L (ref 22–32)
Calcium: 8.3 mg/dL — ABNORMAL LOW (ref 8.9–10.3)
Chloride: 94 mmol/L — ABNORMAL LOW (ref 98–111)
Creatinine, Ser: 3.86 mg/dL — ABNORMAL HIGH (ref 0.61–1.24)
GFR, Estimated: 16 mL/min — ABNORMAL LOW (ref >60)
Glucose, Bld: 282 mg/dL — ABNORMAL HIGH (ref 70–99)
Potassium: 3.8 mmol/L (ref 3.5–5.1)
Sodium: 130 mmol/L — ABNORMAL LOW (ref 135–145)
Total Bilirubin: 1 mg/dL (ref <1.2)
Total Protein: 5.5 g/dL — ABNORMAL LOW (ref 6.5–8.1)

## 2023-10-05 LAB — GLUCOSE, CAPILLARY
Glucose-Capillary: 222 mg/dL — ABNORMAL HIGH (ref 70–99)
Glucose-Capillary: 350 mg/dL — ABNORMAL HIGH (ref 70–99)
Glucose-Capillary: 294 mg/dL — ABNORMAL HIGH (ref 70–99)
Glucose-Capillary: 354 mg/dL — ABNORMAL HIGH (ref 70–99)

## 2023-10-05 LAB — CBC
HCT: 31.2 % — ABNORMAL LOW (ref 39.0–52.0)
Hemoglobin: 10.2 g/dL — ABNORMAL LOW (ref 13.0–17.0)
MCH: 29.5 pg (ref 26.0–34.0)
MCHC: 32.7 g/dL (ref 30.0–36.0)
MCV: 90.2 fL (ref 80.0–100.0)
Platelets: 176 10*3/uL (ref 150–400)
RBC: 3.46 MIL/uL — ABNORMAL LOW (ref 4.22–5.81)
RDW: 12.9 % (ref 11.5–15.5)
WBC: 20.7 10*3/uL — ABNORMAL HIGH (ref 4.0–10.5)
nRBC: 0 % (ref 0.0–0.2)

## 2023-10-05 LAB — PROTIME-INR
INR: 1.2 (ref 0.8–1.2)
Prothrombin Time: 15.5 s — ABNORMAL HIGH (ref 11.4–15.2)

## 2023-10-05 NOTE — Plan of Care (Signed)
  Problem: Education: Goal: Knowledge of risk factors and measures for prevention of condition will improve Outcome: Progressing   Problem: Coping: Goal: Psychosocial and spiritual needs will be supported Outcome: Progressing   Problem: Respiratory: Goal: Complications related to the disease process, condition or treatment will be avoided or minimized Outcome: Progressing   Problem: Education: Goal: Knowledge of General Education information will improve Description: Including pain rating scale, medication(s)/side effects and non-pharmacologic comfort measures Outcome: Progressing   Problem: Health Behavior/Discharge Planning: Goal: Ability to manage health-related needs will improve Outcome: Progressing   Problem: Clinical Measurements: Goal: Ability to maintain clinical measurements within normal limits will improve Outcome: Progressing Goal: Will remain free from infection Outcome: Progressing Goal: Diagnostic test results will improve Outcome: Progressing Goal: Respiratory complications will improve Outcome: Progressing Goal: Cardiovascular complication will be avoided Outcome: Progressing   Problem: Activity: Goal: Risk for activity intolerance will decrease Outcome: Progressing   Problem: Nutrition: Goal: Adequate nutrition will be maintained Outcome: Progressing   Problem: Coping: Goal: Level of anxiety will decrease Outcome: Progressing   Problem: Elimination: Goal: Will not experience complications related to bowel motility Outcome: Progressing Goal: Will not experience complications related to urinary retention Outcome: Progressing   Problem: Pain Management: Goal: General experience of comfort will improve Outcome: Progressing   Problem: Safety: Goal: Ability to remain free from injury will improve Outcome: Progressing   Problem: Skin Integrity: Goal: Risk for impaired skin integrity will decrease Outcome: Progressing   Problem:  Education: Goal: Ability to describe self-care measures that may prevent or decrease complications (Diabetes Survival Skills Education) will improve Outcome: Progressing Goal: Individualized Educational Video(s) Outcome: Progressing   Problem: Coping: Goal: Ability to adjust to condition or change in health will improve Outcome: Progressing   Problem: Fluid Volume: Goal: Ability to maintain a balanced intake and output will improve Outcome: Progressing   Problem: Health Behavior/Discharge Planning: Goal: Ability to identify and utilize available resources and services will improve Outcome: Progressing Goal: Ability to manage health-related needs will improve Outcome: Progressing   Problem: Metabolic: Goal: Ability to maintain appropriate glucose levels will improve Outcome: Progressing   Problem: Nutritional: Goal: Maintenance of adequate nutrition will improve Outcome: Progressing Goal: Progress toward achieving an optimal weight will improve Outcome: Progressing   Problem: Skin Integrity: Goal: Risk for impaired skin integrity will decrease Outcome: Progressing   Problem: Tissue Perfusion: Goal: Adequacy of tissue perfusion will improve Outcome: Progressing

## 2023-10-05 NOTE — Progress Notes (Signed)
PROGRESS NOTE  Philip Benjamin:[id number] DOB: Jan 28, 1951 DOA: 09/29/2023 PCP: Kirstie Peri, MD  Brief History:  72 year old male with a history of hypertension, diabetes mellitus, hyperlipidemia, newly diagnosed ESRD, and anemia of CKD  The patient was recently hospitalized from 08/14/2023-08/20/2023 where he was declared new ESRD and initiated on dialysis. TDC was placed on 08/15/2023, and the patient was initiated on HD.    Pt is admitted secondary to general malaise, nausea and weakness; patient has also expressed intermittent nonproductive coughing spells and mild shortness of breath.  Patient denies chills, fever, overt bleeding, focal weaknesses, headache or any other complaints.   Of note, patient has been released from the skilled nursing facility about 1 week prior to this hospitalization.   Workup in the ED demonstrated positive COVID-19 infection, mild dehydration and hypokalemia.   TRH has been consulted to place in the hospital for further evaluation and management.  The patient also had a recent hospital admission from 08/21/2023 to 08/25/2023 when he was treated for sepsis secondary to Pseudomonas UTI and lobar pneumonia.  He was treated with IV cefepime, and discharged with Cipro for 2 additional doses.  The patient was placed on intravenous steroids for his COVID 19 infection.  His PCT was 2.05.  As result, the patient was initially started on ceftriaxone and azithromycin for presumptive superimposed pneumonia.  He gradually improved, but WBC increased.  UA suggested significant pyuria with WBC>50.  His ceftriaxone was switched to cefepime given his previous Pseudomonas in the urine.  The patient remained afebrile and hemodynamically stable.  Unfortunately, he began developing right upper quadrant abdominal pain with increase in WBC despite being on antibiotics.  CT of the abdomen and pelvis was performed and showed distended gallbladder with pericholecystic  stranding and loculated pericholecystic fluid.  There was concern for pericholecystic abscess.  General surgery was consulted.  IR was also subsequently consulted for percutaneous cholecystotomy tube and drainage of the abscess.   Assessment/Plan: COVID 19 Pneumonia -stable on RA -presented with intermittent nonproductive coughing spells and just mild short winded sensation with activity. -Continue supportive care, bronchodilators and steroids -CRP 32.9>>9.6>>6.1 -Patient was started on steroids a the time of admission>>last dose 10/02/23   pneumonia -Patient with elevated leukocytosis and also elevated procalcitonin -PCT 2.05 -initially ceftriaxone and azithro -Continue mucolytic's, flutter valve and bronchodilator as mentioned above -ceftriaxone to cefepime 10/01/23 -stable on RA -received 5 days azithro -Will have completed full course of therapy with ceftriaxone>> cefepime and azithromycin   Cholecystitis -10/03/2023 CT abdomen--distended gallbladder with pericholecystic stranding and loculated pericholecystic fluid in the right subhepatic space -Appreciate General Surgery, case discussed with Dr. Robyne Peers -Patient remains afebrile hemodynamically stable with minimal right upper quadrant pain -IR for cholecystotomy tube +/-drainage of abscess on 12/16   ESRD -Appreciate nephrology -last HD 10/04/23 -usually on MWF   Pyuria/UTI -sent urine culture 12/11--multiple organisms -startef empiric cefepime as pt had pseudomonas in urine last hospitalization -will give cefepime 2 grams on HD 12/13--pt will have had 5 days total lasting till his next HD   Diabetes mellitus type 2 -08/14/2023 hemoglobin A1c 5.6 -Continue NovoLog sliding scale -Continue reduced dose Semglee during hospitalization -CBGs elevated due to steroids>>now hypoglycemic -change to sensitive sliding scale   hypokalemia -Repleted -Continue to follow electrolytes -correction on HD    hypertension/hyperlipidemia -Continue statins and metoprolol -12/14--now hold statin with mild LFT elevation   BPH -Continue Flomax.   increased intraocular pressure/glaucoma -Continue the use  of latanoprost and also dorzolamide/timolol. -Continue outpatient follow-up with ophthalmology service.   depression/anxiety -Continue as needed Xanax and the use of Prozac.   generalized weakness -Most likely in the setting of pneumonia/COVID -PT evaluation>>SNF   anemia of chronic kidney disease -Stable hemoglobin - Hgb stable in 9 range, no bleeding. On ESA per nephrology    Diarrhea -c diff neg -check stool pathogen panel--pending -prn loperamide -due to COVID-19 -improved           Family Communication:   Family at bedside   Consultants:  renal, general surgery   Code Status:  FULL    DVT Prophylaxis:  [place] Heparin /   Procedures: As Listed in Progress Note Above   Antibiotics: Azithro 12/9>>12/14 Cefepime 12/11>>12/14 Ceftriaxone 12/9>>12/10 Zosyn 12/15>>       Subjective: Patient denies fevers, chills, headache, chest pain, dyspnea, nausea, vomiting, diarrhea, dysuria, hematuria, hematochezia, and melena. States RUQ abd pain is better than last few days  Objective: Vitals:   10/04/23 2145 10/05/23 0608 10/05/23 0937 10/05/23 1300  BP: 124/78 126/73 110/65 116/60  Pulse: 85 81 88 75  Resp: 18 18  20   Temp: 97.7 F (36.5 C) 98.9 F (37.2 C)  98.7 F (37.1 C)  TempSrc: Oral Oral  Oral  SpO2: 100% 100% 100% 100%  Weight:      Height:        Intake/Output Summary (Last 24 hours) at 10/05/2023 1753 Last data filed at 10/05/2023 1300 Gross per 24 hour  Intake 490 ml  Output 1600 ml  Net -1110 ml   Weight change:  Exam:  General:  Pt is alert, follows commands appropriately, not in acute distress HEENT: No icterus, No thrush, No neck mass, [place]/AT Cardiovascular: RRR, S1/S2, no rubs, no gallops Respiratory: CTA bilaterally, no wheezing, no  crackles, no rhonchi Abdomen: Soft/+BS, minimal RUQ tender, non distended, no guarding Extremities: No edema, No lymphangitis, No petechiae, No rashes, no synovitis   Data Reviewed: I have personally reviewed following labs and imaging studies Basic Metabolic Panel: Recent Labs  Lab 09/29/23 0928 09/30/23 0425 10/02/23 0447 10/04/23 1515 10/05/23 0946  NA 134* 134* 130* 132* 130*  K 2.8* 3.2* 3.5 3.0* 3.8  CL 96* 97* 94* 97* 94*  CO2 24 23 23 24 25   GLUCOSE 116* 142* 321* 85 282*  BUN 37* 49* 67* 67* 45*  CREATININE 4.51* 4.90* 4.77* 5.10* 3.86*  CALCIUM 8.6* 8.6* 8.4* 8.5* 8.3*  MG 1.8  1.8 1.9  --   --   --   PHOS 5.1*  --   --  3.3  --    Liver Function Tests: Recent Labs  Lab 09/29/23 0928 10/02/23 0447 10/04/23 1515 10/05/23 0946  AST 37 33 47* 48*  ALT 28 32 61* 64*  ALKPHOS 105 107 105 101  BILITOT 1.0 0.5 0.5 1.0  PROT 6.4* 5.8* 5.7* 5.5*  ALBUMIN 2.7* 2.3* 2.2*  2.2* 2.2*   No results for input(s): "LIPASE", "AMYLASE" in the last 168 hours. No results for input(s): "AMMONIA" in the last 168 hours. Coagulation Profile: Recent Labs  Lab 10/05/23 0904  INR 1.2   CBC: Recent Labs  Lab 09/29/23 0928 09/30/23 0425 10/02/23 0447 10/03/23 0436 10/04/23 0514 10/04/23 1515 10/05/23 0946  WBC 29.9*   < > 16.1* 14.9* 23.4* 23.1* 20.7*  NEUTROABS 26.9*  --   --   --   --   --   --   HGB 10.9*   < > 9.0* 9.6*  10.5* 9.4* 10.2*  HCT 33.6*   < > 28.2* 29.8* 32.5* 28.2* 31.2*  MCV 91.8   < > 89.5 88.7 89.8 88.1 90.2  PLT 161   < > 199 185 178 181 176   < > = values in this interval not displayed.   Cardiac Enzymes: No results for input(s): "CKTOTAL", "CKMB", "CKMBINDEX", "TROPONINI" in the last 168 hours. BNP: Invalid input(s): "POCBNP" CBG: Recent Labs  Lab 10/04/23 1607 10/04/23 2142 10/05/23 0740 10/05/23 1126 10/05/23 1630  GLUCAP 72 185* 222* 350* 294*   HbA1C: No results for input(s): "HGBA1C" in the last 72 hours. Urine analysis:     Component Value Date/Time   COLORURINE AMBER (A) 09/29/2023 1524   APPEARANCEUR CLOUDY (A) 09/29/2023 1524   LABSPEC 1.017 09/29/2023 1524   PHURINE 5.0 09/29/2023 1524   GLUCOSEU NEGATIVE 09/29/2023 1524   HGBUR SMALL (A) 09/29/2023 1524   BILIRUBINUR NEGATIVE 09/29/2023 1524   KETONESUR NEGATIVE 09/29/2023 1524   PROTEINUR 100 (A) 09/29/2023 1524   NITRITE NEGATIVE 09/29/2023 1524   LEUKOCYTESUR MODERATE (A) 09/29/2023 1524   Sepsis Labs: @LABRCNTIP (procalcitonin:4,lacticidven:4) ) Recent Results (from the past 240 hours)  Resp panel by RT-PCR (RSV, Flu A&B, Covid) Anterior Nasal Swab     Status: Abnormal   Collection Time: 09/29/23 10:05 AM   Specimen: Anterior Nasal Swab  Result Value Ref Range Status   SARS Coronavirus 2 by RT PCR POSITIVE (A) NEGATIVE Final    Comment: (NOTE) SARS-CoV-2 target nucleic acids are DETECTED.  The SARS-CoV-2 RNA is generally detectable in upper respiratory specimens during the acute phase of infection. Positive results are indicative of the presence of the identified virus, but do not rule out bacterial infection or co-infection with other pathogens not detected by the test. Clinical correlation with patient history and other diagnostic information is necessary to determine patient infection status. The expected result is Negative.  Fact Sheet for Patients: BloggerCourse.com  Fact Sheet for Healthcare Providers: SeriousBroker.it  This test is not yet approved or cleared by the [place] FDA and  has been authorized for detection and/or diagnosis of SARS-CoV-2 by FDA under an Emergency Use Authorization (EUA).  This EUA will remain in effect (meaning this test can be used) for the duration of  the COVID-19 declaration under Section 564(b)(1) of the A ct, 21 U.S.C. section 360bbb-3(b)(1), unless the authorization is terminated or revoked sooner.     Influenza A by PCR NEGATIVE  NEGATIVE Final   Influenza B by PCR NEGATIVE NEGATIVE Final    Comment: (NOTE) The Xpert Xpress SARS-CoV-2/FLU/RSV plus assay is intended as an aid in the diagnosis of influenza from Nasopharyngeal swab specimens and should not be used as a sole basis for treatment. Nasal washings and aspirates are unacceptable for Xpert Xpress SARS-CoV-2/FLU/RSV testing.  Fact Sheet for Patients: BloggerCourse.com  Fact Sheet for Healthcare Providers: SeriousBroker.it  This test is not yet approved or cleared by the [place] FDA and has been authorized for detection and/or diagnosis of SARS-CoV-2 by FDA under an Emergency Use Authorization (EUA). This EUA will remain in effect (meaning this test can be used) for the duration of the COVID-19 declaration under Section 564(b)(1) of the Act, 21 U.S.C. section 360bbb-3(b)(1), unless the authorization is terminated or revoked.     Resp Syncytial Virus by PCR NEGATIVE NEGATIVE Final    Comment: (NOTE) Fact Sheet for Patients: BloggerCourse.com  Fact Sheet for Healthcare Providers: SeriousBroker.it  This test is not yet approved or cleared by the  [place] [employer] trader and has been authorized for detection and/or diagnosis of SARS-CoV-2 by FDA under an TEFL teacher (EUA). This EUA will remain in effect (meaning this test can be used) for the duration of the COVID-19 declaration under Section 564(b)(1) of the Act, 21 U.S.C. section 360bbb-3(b)(1), unless the authorization is terminated or revoked.  Performed at [facility], [address], [place], [place] [address]   Urine Culture (for pregnant, neutropenic or urologic patients or patients with an indwelling urinary catheter)     Status: Abnormal   Collection Time: 09/29/23  3:24 PM   Specimen: Urine, Clean Catch  Result Value Ref Range Status   Specimen Description   Final     URINE, CLEAN CATCH Performed at [facility], [address] [place], [place] [address]    Special Requests   Final    NONE Performed at [facility], [address] [place], [place] [address]    Culture MULTIPLE SPECIES PRESENT, SUGGEST RECOLLECTION (A)  Final   Report Status 10/02/2023 FINAL  Final  C Difficile Quick Screen w PCR reflex     Status: None   Collection Time: 10/01/23 11:15 AM   Specimen: STOOL  Result Value Ref Range Status   C Diff antigen NEGATIVE NEGATIVE Final   C Diff toxin NEGATIVE NEGATIVE Final   C Diff interpretation No C. difficile detected.  Final    Comment: Performed at [facility], [address] [place], [place] [address]  Gastrointestinal Panel by PCR , Stool     Status: None   Collection Time: 10/02/23 10:15 AM   Specimen: Stool  Result Value Ref Range Status   Campylobacter species NOT DETECTED NOT DETECTED Final   Plesimonas shigelloides NOT DETECTED NOT DETECTED Final   Salmonella species NOT DETECTED NOT DETECTED Final   Yersinia enterocolitica NOT DETECTED NOT DETECTED Final   Vibrio species NOT DETECTED NOT DETECTED Final   Vibrio cholerae NOT DETECTED NOT DETECTED Final   Enteroaggregative E coli (EAEC) NOT DETECTED NOT DETECTED Final   Enteropathogenic E coli (EPEC) NOT DETECTED NOT DETECTED Final   Enterotoxigenic E coli (ETEC) NOT DETECTED NOT DETECTED Final   Shiga like toxin producing E coli (STEC) NOT DETECTED NOT DETECTED Final   Shigella/Enteroinvasive E coli (EIEC) NOT DETECTED NOT DETECTED Final   Cryptosporidium NOT DETECTED NOT DETECTED Final   Cyclospora cayetanensis NOT DETECTED NOT DETECTED Final   Entamoeba histolytica NOT DETECTED NOT DETECTED Final   Giardia lamblia NOT DETECTED NOT DETECTED Final   Adenovirus F40/41 NOT DETECTED NOT DETECTED Final   Astrovirus NOT DETECTED NOT DETECTED Final   Norovirus GI/GII NOT DETECTED NOT DETECTED Final   Rotavirus A NOT DETECTED NOT DETECTED Final   Sapovirus (I, II, IV, and V)  NOT DETECTED NOT DETECTED Final    Comment: Performed at [facility], [address] [place], [place] [address]  Culture, blood (Routine X 2) w Reflex to ID Panel     Status: None (Preliminary result)   Collection Time: 10/03/23  2:37 PM   Specimen: BLOOD  Result Value Ref Range Status   Specimen Description BLOOD RIGHT ANTECUBITAL  Final   Special Requests   Final    Blood Culture results may not be optimal due to an inadequate volume of blood received in culture bottles BOTTLES DRAWN AEROBIC ONLY   Culture   Final    NO GROWTH 2 DAYS Performed at [facility], [address], [place], [place] [address]  Report Status PENDING  Incomplete  Culture, blood (Routine X 2) w Reflex to ID Panel     Status: None (Preliminary result)   Collection Time: 10/03/23  2:45 PM   Specimen: BLOOD  Result Value Ref Range Status   Specimen Description BLOOD BLOOD LEFT HAND  Final   Special Requests   Final    Blood Culture results may not be optimal due to an inadequate volume of blood received in culture bottles BOTTLES DRAWN AEROBIC ONLY   Culture   Final    NO GROWTH 2 DAYS Performed at [facility], [address] [place], [place] [address]    Report Status PENDING  Incomplete     Scheduled Meds:  calcitRIOL  0.25 mcg Oral Daily   Chlorhexidine Gluconate Cloth  6 each Topical Daily   Chlorhexidine Gluconate Cloth  6 each Topical Q0600   cycloSPORINE  1 drop Both Eyes BID   dorzolamide-timolol  1 drop Both Eyes BID   FLUoxetine  20 mg Oral QHS   folic acid  1,000 mcg Oral Daily   heparin  2,000 Units Dialysis Once in dialysis   heparin  5,000 Units Subcutaneous Q8H   insulin aspart  0-6 Units Subcutaneous TID WC   latanoprost  1 drop Left Eye QHS   multivitamin  1 tablet Oral QHS   sodium chloride flush  3 mL Intravenous Q12H   Continuous Infusions:  piperacillin-tazobactam (ZOSYN)  IV 2.25 g (10/05/23 1704)    Procedures/Studies: CT CHEST ABDOMEN PELVIS WO  CONTRAST Result Date: 10/04/2023 CLINICAL DATA:  Upper abdominal pain, weakness, COVID pneumonia EXAM: CT CHEST, ABDOMEN AND PELVIS WITHOUT CONTRAST TECHNIQUE: Multidetector CT imaging of the chest, abdomen and pelvis was performed following the standard protocol without IV contrast. RADIATION DOSE REDUCTION: This exam was performed according to the departmental dose-optimization program which includes automated exposure control, adjustment of the mA and/or kV according to patient size and/or use of iterative reconstruction technique. COMPARISON:  08/21/2023 FINDINGS: CT CHEST FINDINGS Cardiovascular: Extensive multi-vessel coronary artery calcification. Global cardiac size within normal limits. No pericardial effusion. Central pulmonary arteries are of normal caliber. Thoracic aorta is unremarkable. Right internal jugular hemodialysis catheter tip noted within the low right atrium at the hepatocaval junction. Mediastinum/Nodes: No enlarged mediastinal, hilar, or axillary lymph nodes. Thyroid gland, trachea, and esophagus demonstrate no significant findings. Lungs/Pleura: Left lower lobe chronic consolidation and volume loss is again identified with traction bronchiectasis. Platelike atelectasis has developed within the right lower lobe with marked volume loss. Traction bronchiectasis is noted suggesting some degree fibrotic change. Similar, though milder changes are noted within the basilar right middle lobe. No superimposed confluent pulmonary infiltrate. No pneumothorax or pleural effusion. Musculoskeletal: No chest wall mass or suspicious bone lesions identified. CT ABDOMEN PELVIS FINDINGS Hepatobiliary: The gallbladder is distended and there is pericholecystic inflammatory stranding in keeping with changes of acute cholecystitis. There is loculated pericholecystic fluid seen within the right subhepatic space along the peritoneal wall, new since prior examination. Gallbladder perforation and pericholecystic  abscess formation is not excluded. This measures 5.5 x 2.1 cm at axial image # 72/5. The liver is unremarkable. No intra or extrahepatic biliary ductal dilation. Pancreas: Unremarkable Spleen: Unremarkable Adrenals/Urinary Tract: Adrenal glands are unremarkable. Kidneys are normal, without renal calculi, focal lesion, or hydronephrosis. Bladder is unremarkable. Stomach/Bowel: Stomach is within normal limits. Appendix appears normal. No evidence of bowel wall thickening, distention, or inflammatory changes. Mild sigmoid diverticulosis. Vascular/Lymphatic: Aortic atherosclerosis. No enlarged abdominal or pelvic lymph nodes.  Reproductive: Mild prostatic hypertrophy Other: Tiny fat containing umbilical hernia. No abdominopelvic ascites. Musculoskeletal: Osseous structures are age-appropriate. No acute bone abnormality. IMPRESSION: 1. Acute cholecystitis. Loculated pericholecystic fluid within the right subhepatic space along the peritoneal wall, new since prior examination. Gallbladder perforation and pericholecystic abscess formation is not excluded. Correlation with liver enzymes is recommended. 2. Extensive multi-vessel coronary artery calcification. 3. Chronic left lower lobe consolidation and volume loss with traction bronchiectasis. Interval development of platelike atelectasis within the right lower lobe with marked volume loss. Similar, though milder changes are noted within the basilar right middle lobe. 4. Mild sigmoid diverticulosis. 5. Mild prostatic hypertrophy. Aortic Atherosclerosis (ICD10-I70.0). Electronically Signed   By: Helyn Numbers M.D.   On: 10/04/2023 00:08   NM Pulmonary Perfusion Result Date: 10/03/2023 CLINICAL DATA:  Chest pain, dyspnea, COVID-19 pneumonia EXAM: NUCLEAR MEDICINE PERFUSION LUNG SCAN TECHNIQUE: Perfusion images were obtained in multiple projections after intravenous injection of radiopharmaceutical. Ventilation scans intentionally deferred if perfusion scan and chest x-ray  adequate for interpretation during COVID 19 epidemic. RADIOPHARMACEUTICALS:  4.4 mCi Tc-53m MAA IV COMPARISON:  Chest CT 4:34 p.m. FINDINGS: Bilateral lower lobe volume loss corresponds to atelectasis noted on accompanying chest CT. No focal perfusion defects are identified no extra pulmonary radiotracer uptake identified. IMPRESSION: No scintigraphic evidence of acute pulmonary embolism. Electronically Signed   By: Helyn Numbers M.D.   On: 10/03/2023 20:44   DG Chest 2 View Result Date: 09/29/2023 CLINICAL DATA:  Weakness, nausea. EXAM: CHEST - 2 VIEW COMPARISON:  Same day. FINDINGS: Stable cardiomediastinal silhouette. Right internal jugular dialysis catheter is unchanged. Hypoinflation of the lungs is noted with probable bibasilar subsegmental atelectasis and possible small pleural effusions. Bony thorax is unremarkable. IMPRESSION: Hypoinflation of lungs with probable bibasilar subsegmental atelectasis and possible small pleural effusions. Electronically Signed   By: Lupita Raider M.D.   On: 09/29/2023 12:53   DG Chest Portable 1 View Result Date: 09/29/2023 CLINICAL DATA:  ?Pna Pt complains of generalized weakness, nausea, and loss of appetite x 1 week. Pt states he was discharged from SNF around a week ago EXAM: PORTABLE CHEST 1 VIEW COMPARISON:  Chest x-ray 08/14/2023 FINDINGS: Right chest wall dialysis catheter with tip overlying the right atrium. The heart and mediastinal contours are within normal limits. Low lung volumes. Bibasilar airspace opacities. No pulmonary edema. Likely trace bilateral pleural effusion. No pneumothorax. No acute osseous abnormality. IMPRESSION: 1. Low lung volumes with bibasilar airspace opacities that could represent a combination of atelectasis and/or infection/inflammation. Recommend repeat PA and lateral view with improved inspiratory effort for further evaluation. 2. Likely trace bilateral pleural effusions. Electronically Signed   By: Tish Frederickson M.D.   On:  09/29/2023 09:55    Catarina Hartshorn, DO  [facility]  If 7PM-7AM, please contact night-coverage [email or web address] Password [email or web address] 10/05/2023, 5:53 PM   LOS: 5 days

## 2023-10-05 NOTE — Plan of Care (Signed)
  Problem: Education: Goal: Knowledge of risk factors and measures for prevention of condition will improve Outcome: Progressing   Problem: Coping: Goal: Psychosocial and spiritual needs will be supported Outcome: Progressing   Problem: Respiratory: Goal: Complications related to the disease process, condition or treatment will be avoided or minimized Outcome: Progressing   Problem: Education: Goal: Knowledge of General Education information will improve Description: Including pain rating scale, medication(s)/side effects and non-pharmacologic comfort measures Outcome: Progressing   Problem: Health Behavior/Discharge Planning: Goal: Ability to manage health-related needs will improve Outcome: Progressing

## 2023-10-05 NOTE — Plan of Care (Signed)
  Request seen for percutaneous cholecystostomy.  Images reviewed by Dr. Deanne Coffer and patient is appropriate for procedure.  Order placed for NPO @ [facility].  SQ Heparin Held in [facility].  IR Charge RN will call in am and inform patient's RN about timing of transfer to [facility] via [facility].  Sanaz Scarlett S Nayeli Calvert PA-C 10/05/2023 9:24 AM

## 2023-10-05 NOTE — Consult Note (Signed)
[facility] Consult  Reason for Consult: Acute cholecystitis, concern for gallbladder perforation with pericholecystic abscess Referring Physician: Dr. Arbutus Leas  Chief Complaint   Weakness     HPI: Philip Benjamin is a 72 y.o. male who was admitted to the hospital on 12/9 with COVID-pneumonia.  He was also being treated for a superimposed bacterial pneumonia.  He had been progressing well, and was planned to be discharged on Friday, however he had worsening upper abdominal pain with some intermittent nausea and vomiting.  He underwent a CT of the chest, abdomen, and pelvis which demonstrated concern for acute cholecystitis with a loculated pericholecystic fluid collection within the right subhepatic space along the peritoneal wall, gallbladder perforation and abscess not excluded.  He had an increase in his leukocytosis.  Patient states that he has intermittent mild upper abdominal pain and nausea with dry heaving.  He also had an episode of diarrhea earlier today.  His past medical history significant for BPH, glaucoma, end-stage renal disease on hemodialysis, diabetes, and hypertension.  He has a history of bilateral inguinal hernia repair.  He denies use of blood thinning medications.  Past Medical History:  Diagnosis Date   BPH (benign prostatic hyperplasia)    Chronic kidney disease    ckd stage 3   Diabetes mellitus without complication (HCC)    Hypertension     Past Surgical History:  Procedure Laterality Date   EYE SURGERY     INGUINAL HERNIA REPAIR Bilateral 01/23/2018   Procedure: BILATERAL OPEN INGUINAL HERNIA REPAIR WITH MESH;  Surgeon: Jimmye Norman, MD;  Location: [facility] OR;  Service: General;  Laterality: Bilateral;   INSERTION OF MESH Bilateral 01/23/2018   Procedure: INSERTION OF MESH;  Surgeon: Jimmye Norman, MD;  Location: [facility] OR;  Service: General;  Laterality: Bilateral;   IR FLUORO GUIDE CV LINE RIGHT  08/15/2023   IR US GUIDE VASC ACCESS RIGHT  08/15/2023    VASECTOMY     ~5409    History reviewed. No pertinent family history.  Social History   Tobacco Use   Smoking status: Never   Smokeless tobacco: Never  Substance Use Topics   Alcohol use: Never   Drug use: Never    Medications: I have reviewed the patient's current medications.  No Known Allergies   ROS:  Pertinent items are noted in HPI.  Blood pressure 110/65, pulse 88, temperature 98.9 F (37.2 C), temperature source Oral, resp. rate 18, height 6\' 1"  (1.854 m), weight 86.2 kg, SpO2 100%. Physical Exam Vitals reviewed.  Constitutional:      Appearance: Normal appearance.  HENT:     Head: Normocephalic and atraumatic.  Eyes:     Pupils: Pupils are equal, round, and reactive to light.  Cardiovascular:     Rate and Rhythm: Normal rate.  Pulmonary:     Effort: Pulmonary effort is normal.  Abdominal:     Comments: Abdomen soft, nondistended, no percussion tenderness, nontender to palpation; no rigidity, guarding, rebound tenderness; negative Murphy sign  Musculoskeletal:        General: Normal range of motion.     Cervical back: Normal range of motion.  Skin:    General: Skin is warm and dry.  Neurological:     General: No focal deficit present.     Mental Status: He is alert.  Psychiatric:        Mood and Affect: Mood normal.        Behavior: Behavior normal.     Results: Results for  orders placed or performed during the hospital encounter of 09/29/23 (from the past 48 hours)  Glucose, capillary     Status: Abnormal   Collection Time: 10/03/23  1:39 PM  Result Value Ref Range   Glucose-Capillary 269 (H) 70 - 99 mg/dL    Comment: Glucose reference range applies only to samples taken after fasting for at least 8 hours.  Troponin I (High Sensitivity)     Status: Abnormal   Collection Time: 10/03/23  2:28 PM  Result Value Ref Range   Troponin I (High Sensitivity) 43 (H) <18 ng/L    Comment: (NOTE) Elevated high sensitivity troponin I (hsTnI) values and  significant  changes across serial measurements may suggest ACS but many other  chronic and acute conditions are known to elevate hsTnI results.  Refer to the "Links" section for chest pain algorithms and additional  guidance. Performed at [facility], [address] [place], [place] [address]   Culture, blood (Routine X 2) w Reflex to ID Panel     Status: None (Preliminary result)   Collection Time: 10/03/23  2:37 PM   Specimen: BLOOD  Result Value Ref Range   Specimen Description BLOOD RIGHT ANTECUBITAL    Special Requests      Blood Culture results may not be optimal due to an inadequate volume of blood received in culture bottles BOTTLES DRAWN AEROBIC ONLY   Culture      NO GROWTH 2 DAYS Performed at [facility], [address] [place], [place] [address]    Report Status PENDING   Culture, blood (Routine X 2) w Reflex to ID Panel     Status: None (Preliminary result)   Collection Time: 10/03/23  2:45 PM   Specimen: BLOOD  Result Value Ref Range   Specimen Description BLOOD BLOOD LEFT HAND    Special Requests      Blood Culture results may not be optimal due to an inadequate volume of blood received in culture bottles BOTTLES DRAWN AEROBIC ONLY   Culture      NO GROWTH 2 DAYS Performed at [facility], [address] [place], [place] [address]    Report Status PENDING   D-dimer, quantitative     Status: Abnormal   Collection Time: 10/03/23  4:06 PM  Result Value Ref Range   D-Dimer, Quant 1.94 (H) 0.00 - 0.50 ug/mL-FEU    Comment: (NOTE) At the manufacturer cut-off value of 0.5 g/mL FEU, this assay has a negative predictive value of 95-100%.This assay is intended for use in conjunction with a clinical pretest probability (PTP) assessment model to exclude pulmonary embolism (PE) and deep venous thrombosis (DVT) in outpatients suspected of PE or DVT. Results should be correlated with clinical presentation. Performed at [facility], [address] [place], [place]  [address]   Troponin I (High Sensitivity)     Status: Abnormal   Collection Time: 10/03/23  4:06 PM  Result Value Ref Range   Troponin I (High Sensitivity) 43 (H) <18 ng/L    Comment: (NOTE) Elevated high sensitivity troponin I (hsTnI) values and significant  changes across serial measurements may suggest ACS but many other  chronic and acute conditions are known to elevate hsTnI results.  Refer to the "Links" section for chest pain algorithms and additional  guidance. Performed at [facility], [address] [place], [place] [address]   Glucose, capillary     Status: None   Collection Time: 10/03/23  5:17 PM  Result Value Ref Range  Glucose-Capillary 96 70 - 99 mg/dL    Comment: Glucose reference range applies only to samples taken after fasting for at least 8 hours.  Glucose, capillary     Status: Abnormal   Collection Time: 10/03/23  8:25 PM  Result Value Ref Range   Glucose-Capillary 148 (H) 70 - 99 mg/dL    Comment: Glucose reference range applies only to samples taken after fasting for at least 8 hours.   Comment 1 Notify RN    Comment 2 Document in Chart   CBC     Status: Abnormal   Collection Time: 10/04/23  5:14 AM  Result Value Ref Range   WBC 23.4 (H) 4.0 - 10.5 K/uL   RBC 3.62 (L) 4.22 - 5.81 MIL/uL   Hemoglobin 10.5 (L) 13.0 - 17.0 g/dL   HCT 13.0 (L) 86.5 - 78.4 %   MCV 89.8 80.0 - 100.0 fL   MCH 29.0 26.0 - 34.0 pg   MCHC 32.3 30.0 - 36.0 g/dL   RDW 69.6 29.5 - 28.4 %   Platelets 178 150 - 400 K/uL   nRBC 0.0 0.0 - 0.2 %    Comment: Performed at [facility], [address], [place], [place] [address]  C-reactive protein     Status: Abnormal   Collection Time: 10/04/23  5:14 AM  Result Value Ref Range   CRP 14.0 (H) <1.0 mg/dL    Comment: Performed at [facility], [address] [place], [place] [address]  Ferritin     Status: Abnormal   Collection Time: 10/04/23  5:14 AM  Result Value Ref Range   Ferritin 579 (H) 24 - 336 ng/mL    Comment:  Performed at [facility], [address] [place], [place] [address]  Glucose, capillary     Status: Abnormal   Collection Time: 10/04/23  7:25 AM  Result Value Ref Range   Glucose-Capillary 160 (H) 70 - 99 mg/dL    Comment: Glucose reference range applies only to samples taken after fasting for at least 8 hours.  Glucose, capillary     Status: Abnormal   Collection Time: 10/04/23 10:47 AM  Result Value Ref Range   Glucose-Capillary 131 (H) 70 - 99 mg/dL    Comment: Glucose reference range applies only to samples taken after fasting for at least 8 hours.  CBC     Status: Abnormal   Collection Time: 10/04/23  3:15 PM  Result Value Ref Range   WBC 23.1 (H) 4.0 - 10.5 K/uL   RBC 3.20 (L) 4.22 - 5.81 MIL/uL   Hemoglobin 9.4 (L) 13.0 - 17.0 g/dL   HCT 44.0 (L) 34.7 - 42.5 %   MCV 88.1 80.0 - 100.0 fL   MCH 29.4 26.0 - 34.0 pg   MCHC 33.3 30.0 - 36.0 g/dL   RDW 95.6 38.7 - 56.4 %   Platelets 181 150 - 400 K/uL   nRBC 0.0 0.0 - 0.2 %    Comment: Performed at [facility], [address] [place], [place] [address]  Renal function panel     Status: Abnormal   Collection Time: 10/04/23  3:15 PM  Result Value Ref Range   Sodium 132 (L) 135 - 145 mmol/L   Potassium 3.0 (L) 3.5 - 5.1 mmol/L   Chloride 97 (L) 98 - 111 mmol/L   CO2 24 22 - 32 mmol/L   Glucose, Bld 85 70 - 99 mg/dL    Comment: Glucose reference range applies only to samples taken  after fasting for at least 8 hours.   BUN 67 (H) 8 - 23 mg/dL   Creatinine, Ser 4.40 (H) 0.61 - 1.24 mg/dL   Calcium 8.5 (L) 8.9 - 10.3 mg/dL   Phosphorus 3.3 2.5 - 4.6 mg/dL   Albumin 2.2 (L) 3.5 - 5.0 g/dL   GFR, Estimated 11 (L) >60 mL/min    Comment: (NOTE) Calculated using the CKD-EPI Creatinine Equation (2021)    Anion gap 11 5 - 15    Comment: Performed at [facility], [address] [place], [place] [address]  Hepatic function panel     Status: Abnormal   Collection Time: 10/04/23  3:15 PM  Result Value Ref Range   Total Protein 5.7  (L) 6.5 - 8.1 g/dL   Albumin 2.2 (L) 3.5 - 5.0 g/dL   AST 47 (H) 15 - 41 U/L   ALT 61 (H) 0 - 44 U/L   Alkaline Phosphatase 105 38 - 126 U/L   Total Bilirubin 0.5 <1.2 mg/dL   Bilirubin, Direct 0.2 0.0 - 0.2 mg/dL   Indirect Bilirubin 0.3 0.3 - 0.9 mg/dL    Comment: Performed at [facility], [address], [place], [place] [address]  Glucose, capillary     Status: Abnormal   Collection Time: 10/04/23  3:44 PM  Result Value Ref Range   Glucose-Capillary 68 (L) 70 - 99 mg/dL    Comment: Glucose reference range applies only to samples taken after fasting for at least 8 hours.  Glucose, capillary     Status: None   Collection Time: 10/04/23  4:07 PM  Result Value Ref Range   Glucose-Capillary 72 70 - 99 mg/dL    Comment: Glucose reference range applies only to samples taken after fasting for at least 8 hours.  Glucose, capillary     Status: Abnormal   Collection Time: 10/04/23  9:42 PM  Result Value Ref Range   Glucose-Capillary 185 (H) 70 - 99 mg/dL    Comment: Glucose reference range applies only to samples taken after fasting for at least 8 hours.  Glucose, capillary     Status: Abnormal   Collection Time: 10/05/23  7:40 AM  Result Value Ref Range   Glucose-Capillary 222 (H) 70 - 99 mg/dL    Comment: Glucose reference range applies only to samples taken after fasting for at least 8 hours.  Protime-INR     Status: Abnormal   Collection Time: 10/05/23  9:04 AM  Result Value Ref Range   Prothrombin Time 15.5 (H) 11.4 - 15.2 seconds   INR 1.2 0.8 - 1.2    Comment: (NOTE) INR goal varies based on device and disease states. Performed at [facility], [address] [place], [place] [address]   CBC     Status: Abnormal   Collection Time: 10/05/23  9:46 AM  Result Value Ref Range   WBC 20.7 (H) 4.0 - 10.5 K/uL   RBC 3.46 (L) 4.22 - 5.81 MIL/uL   Hemoglobin 10.2 (L) 13.0 - 17.0 g/dL   HCT 42.5 (L) 95.6 - 38.7 %   MCV 90.2 80.0 - 100.0 fL   MCH 29.5 26.0 - 34.0 pg   MCHC 32.7  30.0 - 36.0 g/dL   RDW 56.4 33.2 - 95.1 %   Platelets 176 150 - 400 K/uL   nRBC 0.0 0.0 - 0.2 %    Comment: Performed at [facility], [address], [place], [place] [address]  Comprehensive metabolic panel     Status: Abnormal  Collection Time: 10/05/23  9:46 AM  Result Value Ref Range   Sodium 130 (L) 135 - 145 mmol/L   Potassium 3.8 3.5 - 5.1 mmol/L   Chloride 94 (L) 98 - 111 mmol/L   CO2 25 22 - 32 mmol/L   Glucose, Bld 282 (H) 70 - 99 mg/dL    Comment: Glucose reference range applies only to samples taken after fasting for at least 8 hours.   BUN 45 (H) 8 - 23 mg/dL   Creatinine, Ser 3.47 (H) 0.61 - 1.24 mg/dL   Calcium 8.3 (L) 8.9 - 10.3 mg/dL   Total Protein 5.5 (L) 6.5 - 8.1 g/dL   Albumin 2.2 (L) 3.5 - 5.0 g/dL   AST 48 (H) 15 - 41 U/L   ALT 64 (H) 0 - 44 U/L   Alkaline Phosphatase 101 38 - 126 U/L   Total Bilirubin 1.0 <1.2 mg/dL   GFR, Estimated 16 (L) >60 mL/min    Comment: (NOTE) Calculated using the CKD-EPI Creatinine Equation (2021)    Anion gap 11 5 - 15    Comment: Performed at [facility], [address] [place], [place] [address]  Glucose, capillary     Status: Abnormal   Collection Time: 10/05/23 11:26 AM  Result Value Ref Range   Glucose-Capillary 350 (H) 70 - 99 mg/dL    Comment: Glucose reference range applies only to samples taken after fasting for at least 8 hours.    CT CHEST ABDOMEN PELVIS WO CONTRAST Result Date: 10/04/2023 CLINICAL DATA:  Upper abdominal pain, weakness, COVID pneumonia EXAM: CT CHEST, ABDOMEN AND PELVIS WITHOUT CONTRAST TECHNIQUE: Multidetector CT imaging of the chest, abdomen and pelvis was performed following the standard protocol without IV contrast. RADIATION DOSE REDUCTION: This exam was performed according to the departmental dose-optimization program which includes automated exposure control, adjustment of the mA and/or kV according to patient size and/or use of iterative reconstruction technique. COMPARISON:  08/21/2023  FINDINGS: CT CHEST FINDINGS Cardiovascular: Extensive multi-vessel coronary artery calcification. Global cardiac size within normal limits. No pericardial effusion. Central pulmonary arteries are of normal caliber. Thoracic aorta is unremarkable. Right internal jugular hemodialysis catheter tip noted within the low right atrium at the hepatocaval junction. Mediastinum/Nodes: No enlarged mediastinal, hilar, or axillary lymph nodes. Thyroid gland, trachea, and esophagus demonstrate no significant findings. Lungs/Pleura: Left lower lobe chronic consolidation and volume loss is again identified with traction bronchiectasis. Platelike atelectasis has developed within the right lower lobe with marked volume loss. Traction bronchiectasis is noted suggesting some degree fibrotic change. Similar, though milder changes are noted within the basilar right middle lobe. No superimposed confluent pulmonary infiltrate. No pneumothorax or pleural effusion. Musculoskeletal: No chest wall mass or suspicious bone lesions identified. CT ABDOMEN PELVIS FINDINGS Hepatobiliary: The gallbladder is distended and there is pericholecystic inflammatory stranding in keeping with changes of acute cholecystitis. There is loculated pericholecystic fluid seen within the right subhepatic space along the peritoneal wall, new since prior examination. Gallbladder perforation and pericholecystic abscess formation is not excluded. This measures 5.5 x 2.1 cm at axial image # 72/5. The liver is unremarkable. No intra or extrahepatic biliary ductal dilation. Pancreas: Unremarkable Spleen: Unremarkable Adrenals/Urinary Tract: Adrenal glands are unremarkable. Kidneys are normal, without renal calculi, focal lesion, or hydronephrosis. Bladder is unremarkable. Stomach/Bowel: Stomach is within normal limits. Appendix appears normal. No evidence of bowel wall thickening, distention, or inflammatory changes. Mild sigmoid diverticulosis. Vascular/Lymphatic: Aortic  atherosclerosis. No enlarged abdominal or pelvic lymph nodes. Reproductive: Mild prostatic hypertrophy Other:  Tiny fat containing umbilical hernia. No abdominopelvic ascites. Musculoskeletal: Osseous structures are age-appropriate. No acute bone abnormality. IMPRESSION: 1. Acute cholecystitis. Loculated pericholecystic fluid within the right subhepatic space along the peritoneal wall, new since prior examination. Gallbladder perforation and pericholecystic abscess formation is not excluded. Correlation with liver enzymes is recommended. 2. Extensive multi-vessel coronary artery calcification. 3. Chronic left lower lobe consolidation and volume loss with traction bronchiectasis. Interval development of platelike atelectasis within the right lower lobe with marked volume loss. Similar, though milder changes are noted within the basilar right middle lobe. 4. Mild sigmoid diverticulosis. 5. Mild prostatic hypertrophy. Aortic Atherosclerosis (ICD10-I70.0). Electronically Signed   By: Helyn Numbers M.D.   On: 10/04/2023 00:08   NM Pulmonary Perfusion Result Date: 10/03/2023 CLINICAL DATA:  Chest pain, dyspnea, COVID-19 pneumonia EXAM: NUCLEAR MEDICINE PERFUSION LUNG SCAN TECHNIQUE: Perfusion images were obtained in multiple projections after intravenous injection of radiopharmaceutical. Ventilation scans intentionally deferred if perfusion scan and chest x-ray adequate for interpretation during COVID 19 epidemic. RADIOPHARMACEUTICALS:  4.4 mCi Tc-74m MAA IV COMPARISON:  Chest CT 4:34 p.m. FINDINGS: Bilateral lower lobe volume loss corresponds to atelectasis noted on accompanying chest CT. No focal perfusion defects are identified no extra pulmonary radiotracer uptake identified. IMPRESSION: No scintigraphic evidence of acute pulmonary embolism. Electronically Signed   By: Helyn Numbers M.D.   On: 10/03/2023 20:44     Assessment & Plan:  Philip Benjamin is a 72 y.o. male who was admitted with COVID pneumonia  and subsequently developed concern for acute cholecystitis on imaging.  Imaging and blood work evaluated by myself.  -Upon my review of the imaging, he does have a distended and inflamed appearing gallbladder on CT without contrast.  I also see this fluid collection nearby.  Given his current admission for pneumonia with relatively new end-stage renal disease, I feel the patient is not a great surgical candidate at this time.  Patient currently hemodynamically stable with benign abdominal exam -IR has been consulted and plans to perform percutaneous cholecystostomy tube and drainage of pericholecystic fluid collection tomorrow -Okay for low-fat diet from surgical standpoint, NPO at midnight -Mild improvement in patient's leukocytosis to 20.7 from 23.1 -No elevation in alkaline phosphatase or total bilirubin.  Mild elevation of AST and ALT -Continue IV antibiotics, currently on Zosyn -If patient clinically worsens, may need to proceed with cholecystectomy, but will attempt less invasive measures with percutaneous cholecystostomy tube first -Appreciate hospitalist recommendations  All questions were answered to the satisfaction of the patient and family.  -- Theophilus Kinds, DO [facility] [address] [place], [place] [address] [phone] (office)

## 2023-10-06 ENCOUNTER — Ambulatory Visit (HOSPITAL_COMMUNITY)
Admission: RE | Admit: 2023-10-06 | Discharge: 2023-10-06 | Disposition: A | Discharge status: Home / Self Care | Payer: Medicare Other | Source: Ambulatory Visit | Attending: Internal Medicine | Admitting: Internal Medicine

## 2023-10-06 ENCOUNTER — Inpatient Hospital Stay (HOSPITAL_COMMUNITY): Payer: Medicare Other

## 2023-10-06 DIAGNOSIS — Z794 Long term (current) use of insulin: Secondary | ICD-10-CM

## 2023-10-06 DIAGNOSIS — H547 Unspecified visual loss: Secondary | ICD-10-CM | POA: Insufficient documentation

## 2023-10-06 DIAGNOSIS — K8000 Calculus of gallbladder with acute cholecystitis without obstruction: Secondary | ICD-10-CM | POA: Insufficient documentation

## 2023-10-06 DIAGNOSIS — U071 COVID-19: Secondary | ICD-10-CM | POA: Diagnosis not present

## 2023-10-06 DIAGNOSIS — I120 Hypertensive chronic kidney disease with stage 5 chronic kidney disease or end stage renal disease: Secondary | ICD-10-CM | POA: Insufficient documentation

## 2023-10-06 DIAGNOSIS — N400 Enlarged prostate without lower urinary tract symptoms: Secondary | ICD-10-CM | POA: Insufficient documentation

## 2023-10-06 DIAGNOSIS — N186 End stage renal disease: Secondary | ICD-10-CM | POA: Insufficient documentation

## 2023-10-06 DIAGNOSIS — H9193 Unspecified hearing loss, bilateral: Secondary | ICD-10-CM | POA: Insufficient documentation

## 2023-10-06 DIAGNOSIS — K810 Acute cholecystitis: Secondary | ICD-10-CM | POA: Diagnosis not present

## 2023-10-06 DIAGNOSIS — R531 Weakness: Secondary | ICD-10-CM | POA: Diagnosis not present

## 2023-10-06 DIAGNOSIS — E1122 Type 2 diabetes mellitus with diabetic chronic kidney disease: Secondary | ICD-10-CM | POA: Insufficient documentation

## 2023-10-06 DIAGNOSIS — Z992 Dependence on renal dialysis: Secondary | ICD-10-CM | POA: Insufficient documentation

## 2023-10-06 HISTORY — PX: IR PERC CHOLECYSTOSTOMY: IMG2326

## 2023-10-06 HISTORY — PX: IR US GUIDE BX ASP/DRAIN: IMG2392

## 2023-10-06 LAB — CBC
HCT: 28.7 % — ABNORMAL LOW (ref 39.0–52.0)
Hemoglobin: 9.2 g/dL — ABNORMAL LOW (ref 13.0–17.0)
MCH: 28.8 pg (ref 26.0–34.0)
MCHC: 32.1 g/dL (ref 30.0–36.0)
MCV: 89.7 fL (ref 80.0–100.0)
Platelets: 208 10*3/uL (ref 150–400)
RBC: 3.2 MIL/uL — ABNORMAL LOW (ref 4.22–5.81)
RDW: 12.9 % (ref 11.5–15.5)
WBC: 16.1 10*3/uL — ABNORMAL HIGH (ref 4.0–10.5)
nRBC: 0 % (ref 0.0–0.2)

## 2023-10-06 LAB — GLUCOSE, CAPILLARY
Glucose-Capillary: 268 mg/dL — ABNORMAL HIGH (ref 70–99)
Glucose-Capillary: 206 mg/dL — ABNORMAL HIGH (ref 70–99)
Glucose-Capillary: 156 mg/dL — ABNORMAL HIGH (ref 70–99)
Glucose-Capillary: 275 mg/dL — ABNORMAL HIGH (ref 70–99)

## 2023-10-06 LAB — COMPREHENSIVE METABOLIC PANEL
ALT: 58 U/L — ABNORMAL HIGH (ref 0–44)
AST: 41 U/L (ref 15–41)
Albumin: 2.1 g/dL — ABNORMAL LOW (ref 3.5–5.0)
Alkaline Phosphatase: 90 U/L (ref 38–126)
Anion gap: 12 (ref 5–15)
BUN: 53 mg/dL — ABNORMAL HIGH (ref 8–23)
CO2: 24 mmol/L (ref 22–32)
Calcium: 8.4 mg/dL — ABNORMAL LOW (ref 8.9–10.3)
Chloride: 94 mmol/L — ABNORMAL LOW (ref 98–111)
Creatinine, Ser: 4.63 mg/dL — ABNORMAL HIGH (ref 0.61–1.24)
GFR, Estimated: 13 mL/min — ABNORMAL LOW (ref >60)
Glucose, Bld: 276 mg/dL — ABNORMAL HIGH (ref 70–99)
Potassium: 3.5 mmol/L (ref 3.5–5.1)
Sodium: 130 mmol/L — ABNORMAL LOW (ref 135–145)
Total Bilirubin: 0.8 mg/dL (ref <1.2)
Total Protein: 5.4 g/dL — ABNORMAL LOW (ref 6.5–8.1)

## 2023-10-06 MED ORDER — LIDOCAINE HCL 1 % IJ SOLN
20.0000 mL | Freq: Once | INTRAMUSCULAR | Status: AC
Start: 2023-10-06 — End: 2023-10-06
  Administered 2023-10-06: 10 mL

## 2023-10-06 MED ORDER — MIDAZOLAM HCL 2 MG/2ML IJ SOLN
INTRAMUSCULAR | Status: AC | PRN
  Administered 2023-10-06: 1 mg via INTRAVENOUS
  Administered 2023-10-06 (×2): .5 mg via INTRAVENOUS

## 2023-10-06 MED ORDER — INSULIN GLARGINE-YFGN 100 UNIT/ML ~~LOC~~ SOLN
15.0000 [IU] | Freq: Every day | SUBCUTANEOUS | Status: DC
  Administered 2023-10-06: 15 [IU] via SUBCUTANEOUS
  Filled 2023-10-06 (×3): qty 0.15

## 2023-10-06 MED ORDER — HEPARIN SODIUM (PORCINE) 1000 UNIT/ML IJ SOLN
INTRAMUSCULAR | Status: AC
Start: 2023-10-06 — End: ?
  Filled 2023-10-06: qty 4

## 2023-10-06 MED ORDER — FENTANYL CITRATE (PF) 100 MCG/2ML IJ SOLN
INTRAMUSCULAR | Status: AC | PRN
  Administered 2023-10-06 (×4): 25 ug via INTRAVENOUS

## 2023-10-06 MED ORDER — HEPARIN SODIUM (PORCINE) 5000 UNIT/ML IJ SOLN
5000.0000 [IU] | Freq: Three times a day (TID) | INTRAMUSCULAR | Status: DC
  Administered 2023-10-06 – 2023-10-10 (×9): 5000 [IU] via SUBCUTANEOUS
  Filled 2023-10-06 (×10): qty 1

## 2023-10-06 MED ORDER — IOHEXOL 300 MG/ML  SOLN
50.0000 mL | Freq: Once | INTRAMUSCULAR | Status: AC | PRN
  Administered 2023-10-06: 15 mL

## 2023-10-06 MED ORDER — PIPERACILLIN SOD-TAZOBACTAM SO 2.25 (2-0.25) G IV SOLR
2.2500 g | Freq: Three times a day (TID) | INTRAVENOUS | Status: DC
  Administered 2023-10-06 – 2023-10-07 (×3): 2.25 g via INTRAVENOUS
  Filled 2023-10-06 (×6): qty 10

## 2023-10-06 NOTE — Progress Notes (Signed)
Mobility Specialist Progress Note:    10/06/23 1055  Mobility  Activity Stood at bedside;Dangled on edge of bed (STS x3)  Level of Assistance Moderate assist, patient does 50-74%  Assistive Device Front wheel walker  Range of Motion/Exercises Active;All extremities  Activity Response Tolerated well  Mobility Referral Yes  Mobility visit 1 Mobility  Mobility Specialist Start Time (ACUTE ONLY) 1010  Mobility Specialist Stop Time (ACUTE ONLY) 1025  Mobility Specialist Time Calculation (min) (ACUTE ONLY) 15 min   Pt received in bed, agreeable to mobility. Required ModA to stand with RW. Tolerated well pt refused ambulation d/t anxiety and weakness. Returned supine, all needs met.   Lawerance Bach Mobility Specialist Please contact via Special educational needs teacher or  Rehab office at [phone]

## 2023-10-06 NOTE — Progress Notes (Signed)
Patient ID: Philip Benjamin, male   DOB: November 27, 1950, 72 y.o.   MRN: [id number] S: No new complaints or issues last night. O:BP 121/72 (BP Location: Right Arm)   Pulse 71   Temp 97.9 F (36.6 C) (Oral)   Resp 17   Ht 6\' 1"  (1.854 m)   Wt 86.2 kg   SpO2 99%   BMI 25.07 kg/m   Intake/Output Summary (Last 24 hours) at 10/06/2023 0853 Last data filed at 10/06/2023 0818 Gross per 24 hour  Intake 680 ml  Output --  Net 680 ml   Intake/Output: I/O last 3 completed shifts: In: 730 [P.O.:680; IV Piggyback:50] Out: 600 [Urine:600]  Intake/Output this shift:  No intake/output data recorded. Weight change:  Gen: NAD CVS: RRR Resp:CTA Abd: +BS, soft, mild tenderness to RUQ, no guarding or rebound Ext: no edema  Recent Labs  Lab 09/29/23 0928 09/30/23 0425 10/02/23 0447 10/04/23 1515 10/05/23 0946 10/06/23 0454  NA 134* 134* 130* 132* 130* 130*  K 2.8* 3.2* 3.5 3.0* 3.8 3.5  CL 96* 97* 94* 97* 94* 94*  CO2 24 23 23 24 25 24   GLUCOSE 116* 142* 321* 85 282* 276*  BUN 37* 49* 67* 67* 45* 53*  CREATININE 4.51* 4.90* 4.77* 5.10* 3.86* 4.63*  ALBUMIN 2.7*  --  2.3* 2.2*  2.2* 2.2* 2.1*  CALCIUM 8.6* 8.6* 8.4* 8.5* 8.3* 8.4*  PHOS 5.1*  --   --  3.3  --   --   AST 37  --  33 47* 48* 41  ALT 28  --  32 61* 64* 58*   Liver Function Tests: Recent Labs  Lab 10/04/23 1515 10/05/23 0946 10/06/23 0454  AST 47* 48* 41  ALT 61* 64* 58*  ALKPHOS 105 101 90  BILITOT 0.5 1.0 0.8  PROT 5.7* 5.5* 5.4*  ALBUMIN 2.2*  2.2* 2.2* 2.1*   No results for input(s): "LIPASE", "AMYLASE" in the last 168 hours. No results for input(s): "AMMONIA" in the last 168 hours. CBC: Recent Labs  Lab 09/29/23 0928 09/30/23 0425 10/03/23 0436 10/04/23 0514 10/04/23 1515 10/05/23 0946 10/06/23 0454  WBC 29.9*   < > 14.9* 23.4* 23.1* 20.7* 16.1*  NEUTROABS 26.9*  --   --   --   --   --   --   HGB 10.9*   < > 9.6* 10.5* 9.4* 10.2* 9.2*  HCT 33.6*   < > 29.8* 32.5* 28.2* 31.2* 28.7*  MCV 91.8    < > 88.7 89.8 88.1 90.2 89.7  PLT 161   < > 185 178 181 176 208   < > = values in this interval not displayed.   Cardiac Enzymes: No results for input(s): "CKTOTAL", "CKMB", "CKMBINDEX", "TROPONINI" in the last 168 hours. CBG: Recent Labs  Lab 10/05/23 0740 10/05/23 1126 10/05/23 1630 10/05/23 2035 10/06/23 0735  GLUCAP 222* 350* 294* 354* 268*    Iron Studies:  Recent Labs    10/04/23 0514  FERRITIN 579*   Studies/Results: No results found.  calcitRIOL  0.25 mcg Oral Daily   Chlorhexidine Gluconate Cloth  6 each Topical Daily   Chlorhexidine Gluconate Cloth  6 each Topical Q0600   cycloSPORINE  1 drop Both Eyes BID   dorzolamide-timolol  1 drop Both Eyes BID   FLUoxetine  20 mg Oral QHS   folic acid  1,000 mcg Oral Daily   heparin  2,000 Units Dialysis Once in dialysis   heparin  5,000 Units Subcutaneous Q8H   insulin  aspart  0-6 Units Subcutaneous TID WC   insulin glargine-yfgn  15 Units Subcutaneous Daily   latanoprost  1 drop Left Eye QHS   multivitamin  1 tablet Oral QHS   sodium chloride flush  3 mL Intravenous Q12H    BMET    Component Value Date/Time   NA 130 (L) 10/06/2023 0454   K 3.5 10/06/2023 0454   CL 94 (L) 10/06/2023 0454   CO2 24 10/06/2023 0454   GLUCOSE 276 (H) 10/06/2023 0454   BUN 53 (H) 10/06/2023 0454   CREATININE 4.63 (H) 10/06/2023 0454   CALCIUM 8.4 (L) 10/06/2023 0454   GFRNONAA 13 (L) 10/06/2023 0454   GFRAA 44 (L) 01/14/2018 1112   CBC    Component Value Date/Time   WBC 16.1 (H) 10/06/2023 0454   RBC 3.20 (L) 10/06/2023 0454   HGB 9.2 (L) 10/06/2023 0454   HCT 28.7 (L) 10/06/2023 0454   PLT 208 10/06/2023 0454   MCV 89.7 10/06/2023 0454   MCH 28.8 10/06/2023 0454   MCHC 32.1 10/06/2023 0454   RDW 12.9 10/06/2023 0454   LYMPHSABS 0.7 09/29/2023 0928   MONOABS 2.0 (H) 09/29/2023 0928   EOSABS 0.0 09/29/2023 0928   BASOSABS 0.1 09/29/2023 0928    Outpatient HD orders: [facility], MWF. 4 hours. EDW 90kg. TDC (has appt  coming up with [facility] on 10/07/23 for perm access consideration). Flow rates: 350/500. 2K, 2.5Ca. Heparin: 1000 units/hr. Meds: Mircera every 4 weeks (last dose 09/15/23), Venofer 50mg  every Monday.   Assessment/Plan:   Covid-19 with superimposed bacterial pneumonia - per primary; on RA currently. Being treated for concurrent bacterial PNA with abx per primary Diarrhea - c diff neg, GI panel negative.  Appears to be improving. Cholecystitis - seen by CT scan with distended gallbladder and pericholecystic stranding and loculated fluid.  Surgery following.  IR consulted for cholecyststomy tube placement today.  On IV antibiotics already. ESRD - plan for HD today after cholecystostomy tube placement to continue with MWF schedule. HTN/volume - normotensive, no edema.   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  Phos stable.   Irena Cords, MD [employer] [phone]

## 2023-10-06 NOTE — Procedures (Signed)
Interventional Radiology Procedure Note  Procedure:   1.) Placement of a 21F transhepatic percutaneous cholecystostomy tube 2.) Placement of a 2nd 21F drain into the pericholecystic fluid collection with aspiration of bilious fluid  Complications: None  Estimated Blood Loss: None  Recommendations: - Perc chole tube to bag drainage - Biloma tube to JP bulb    Signed,  Sterling Big, MD

## 2023-10-06 NOTE — Progress Notes (Signed)
[facility] Progress Note     Subjective: Patient seen and examined.  He is resting comfortably in bed.  He was able to tolerate his dinner without nausea and vomiting.  He still has some right upper quadrant abdominal soreness, but denies significant pain.  Objective: Vital signs in last 24 hours: Temp:  [97.8 F (36.6 C)-98 F (36.7 C)] 97.8 F (36.6 C) (12/16 1335) Pulse Rate:  [62-84] 64 (12/16 1335) Resp:  [14-18] 18 (12/16 1335) BP: (95-138)/(64-79) 138/77 (12/16 1335) SpO2:  [99 %-100 %] 100 % (12/16 1335) Weight:  [87.3 kg-87.5 kg] 87.3 kg (12/16 1335) Last BM Date : 10/05/23  Intake/Output from previous day: 12/15 0701 - 12/16 0700 In: 680 [P.O.:680] Out: -  Intake/Output this shift: No intake/output data recorded.  General appearance: alert, cooperative, and no distress GI: Abdomen soft, nondistended, no percussion tenderness, nontender to palpation; no rigidity, guarding, rebound tenderness; negative Murphy sign  Lab Results:  Recent Labs    10/05/23 0946 10/06/23 0454  WBC 20.7* 16.1*  HGB 10.2* 9.2*  HCT 31.2* 28.7*  PLT 176 208   BMET Recent Labs    10/05/23 0946 10/06/23 0454  NA 130* 130*  K 3.8 3.5  CL 94* 94*  CO2 25 24  GLUCOSE 282* 276*  BUN 45* 53*  CREATININE 3.86* 4.63*  CALCIUM 8.3* 8.4*   PT/INR Recent Labs    10/05/23 0904  LABPROT 15.5*  INR 1.2    Studies/Results: No results found.  Anti-infectives: Anti-infectives (From admission, onward)    Start     Dose/Rate Route Frequency Ordered Stop   10/06/23 1600  piperacillin-tazobactam (ZOSYN) 2.25 g in sodium chloride 0.9 % 50 mL IVPB        2.25 g 100 mL/hr over 30 Minutes Intravenous Every 8 hours 10/06/23 0914     10/04/23 1600  piperacillin-tazobactam (ZOSYN) IVPB 2.25 g  Status:  Discontinued        2.25 g 100 mL/hr over 30 Minutes Intravenous Every 8 hours 10/04/23 1502 10/06/23 0914   10/03/23 1400  ceFEPIme (MAXIPIME) 2 g in sodium chloride  0.9 % 100 mL IVPB        2 g 200 mL/hr over 30 Minutes Intravenous Every M-W-F (Hemodialysis) 10/03/23 1033 10/03/23 1437   10/01/23 2000  ceFEPIme (MAXIPIME) 1 g in sodium chloride 0.9 % 100 mL IVPB  Status:  Discontinued        1 g 200 mL/hr over 30 Minutes Intravenous Every 24 hours 10/01/23 1830 10/03/23 1033   09/29/23 2015  azithromycin (ZITHROMAX) tablet 500 mg  Status:  Discontinued        500 mg Oral Daily 09/29/23 1929 10/04/23 1745   09/29/23 2000  cefTRIAXone (ROCEPHIN) 1 g in sodium chloride 0.9 % 100 mL IVPB  Status:  Discontinued        1 g 200 mL/hr over 30 Minutes Intravenous Every 24 hours 09/29/23 1929 10/01/23 1830   09/29/23 1345  cefTRIAXone (ROCEPHIN) 1 g in sodium chloride 0.9 % 100 mL IVPB  Status:  Discontinued        1 g 200 mL/hr over 30 Minutes Intravenous  Once 09/29/23 1333 09/29/23 1354   09/29/23 1345  azithromycin (ZITHROMAX) 500 mg in sodium chloride 0.9 % 250 mL IVPB  Status:  Discontinued        500 mg 250 mL/hr over 60 Minutes Intravenous  Once 09/29/23 1333 09/29/23 1354       Assessment/Plan:  Patient is a  72 year old male who was admitted with COVID-pneumonia and subsequently developed concern for acute cholecystitis on imaging.  Imaging and blood work evaluated by myself.  -Plan for percutaneous cholecystostomy tube and drainage of pericholecystic abscess today with IR -Leukocytosis slightly improved today, 16.1 from 20.7 -Continue IV Zosyn -Improving LFTs with still normal alkaline phosphatase and total bilirubin -NPO for procedure, okay for low-fat diet from surgical standpoint after cholecystostomy tube in place -PRN pain control and antiemetics -If patient clinically worsens after percutaneous cholecystostomy tube, may need to proceed with cholecystectomy -No plans for surgery at this time -Appreciate hospitalist recommendations   LOS: 6 days    [place] Devine A Marilene Vath 10/06/2023

## 2023-10-06 NOTE — TOC Progression Note (Signed)
Transition of Care [facility]) - Progression Note    Patient Details  Name: Philip Benjamin MRN: [id number] Date of Birth: 1951/07/13  Transition of Care [facility] CM/SW Contact  Karn Cassis, [place] Phone Number: 10/06/2023, 8:18 AM  Clinical Narrative:  LCSW confirmed pt can admit to [facility] on Friday, December 20. Pt's daughter updated. TOC will follow.     Expected Discharge Plan: Skilled Nursing Facility Barriers to Discharge: Continued Medical Work up  Expected Discharge Plan and Services In-house Referral: Clinical Social Work   Post Acute Care Choice: Skilled Nursing Facility Living arrangements for the past 2 months: Single Family Home Expected Discharge Date: 10/03/23                         HH Arranged: PT, RN HH Agency: [facility] Date [facility] Agency Contacted: 09/30/23 Time HH Agency Contacted: [phone] Representative spoke with at [facility] Agency: Clydie Braun   Social Determinants of Health (SDOH) Interventions SDOH Screenings   Food Insecurity: No Food Insecurity (09/29/2023)  Housing: Patient Declined (09/29/2023)  Transportation Needs: No Transportation Needs (09/29/2023)  Utilities: Not At Risk (09/29/2023)  Tobacco Use: Low Risk  (09/29/2023)  Health Literacy: Medium Risk (09/05/2023)   Received from [facility]    Readmission Risk Interventions    10/01/2023    7:48 AM 08/22/2023   12:48 PM 08/15/2023   12:29 PM  Readmission Risk Prevention Plan  Transportation Screening Complete Complete Complete  Home Care Screening  Complete Complete  Medication Review (RN CM)  Complete Complete  HRI or Home Care Consult Complete    Social Work Consult for Recovery Care Planning/Counseling Complete    Palliative Care Screening Not Applicable    Medication Review Oceanographer) Complete

## 2023-10-06 NOTE — Plan of Care (Signed)
  Problem: Education: Goal: Knowledge of risk factors and measures for prevention of condition will improve Outcome: Progressing   Problem: Coping: Goal: Psychosocial and spiritual needs will be supported Outcome: Progressing   Problem: Respiratory: Goal: Complications related to the disease process, condition or treatment will be avoided or minimized Outcome: Progressing   Problem: Education: Goal: Knowledge of General Education information will improve Description: Including pain rating scale, medication(s)/side effects and non-pharmacologic comfort measures Outcome: Progressing   Problem: Health Behavior/Discharge Planning: Goal: Ability to manage health-related needs will improve Outcome: Progressing   Problem: Clinical Measurements: Goal: Ability to maintain clinical measurements within normal limits will improve Outcome: Progressing Goal: Will remain free from infection Outcome: Progressing Goal: Diagnostic test results will improve Outcome: Progressing Goal: Respiratory complications will improve Outcome: Progressing Goal: Cardiovascular complication will be avoided Outcome: Progressing

## 2023-10-06 NOTE — Progress Notes (Addendum)
PROGRESS NOTE   Philip Benjamin  QIO:[id number] DOB: [birth year] DOA: 09/29/2023 PCP: Kirstie Peri, MD   Chief Complaint  Patient presents with   Weakness   Level of care: Med-Surg  Brief Admission History:  72 year old male with a history of hypertension, diabetes mellitus, hyperlipidemia, newly diagnosed ESRD, and anemia of CKD  The patient was recently hospitalized from 08/14/2023-08/20/2023 where he was declared new ESRD and initiated on dialysis. TDC was placed on 08/15/2023, and the patient was initiated on HD.    Pt is admitted secondary to general malaise, nausea and weakness; patient has also expressed intermittent nonproductive coughing spells and mild shortness of breath.  Patient denies chills, fever, overt bleeding, focal weaknesses, headache or any other complaints.   Of note, patient has been released from the skilled nursing facility about 1 week prior to this hospitalization.   Workup in the ED demonstrated positive COVID-19 infection, mild dehydration and hypokalemia.   TRH has been consulted to place in the hospital for further evaluation and management.  The patient also had a recent hospital admission from 08/21/2023 to 08/25/2023 when he was treated for sepsis secondary to Pseudomonas UTI and lobar pneumonia.  He was treated with IV cefepime, and discharged with Cipro for 2 additional doses.  The patient was placed on intravenous steroids for his COVID 19 infection.  His PCT was 2.05.  As result, the patient was initially started on ceftriaxone and azithromycin for presumptive superimposed pneumonia.  He gradually improved, but WBC increased.  UA suggested significant pyuria with WBC>50.  His ceftriaxone was switched to cefepime given his previous Pseudomonas in the urine.  The patient remained afebrile and hemodynamically stable.  Unfortunately, he began developing right upper quadrant abdominal pain with increase in WBC despite being on antibiotics.  CT of the  abdomen and pelvis was performed and showed distended gallbladder with pericholecystic stranding and loculated pericholecystic fluid.  There was concern for pericholecystic abscess.  General surgery was consulted.  IR was also subsequently consulted for percutaneous cholecystotomy tube and drainage of the abscess.   Assessment and Plan:  COVID 19 Pneumonia -stable on RA -presented with intermittent nonproductive coughing spells and just mild short winded sensation with activity. -Continue supportive care, bronchodilators and steroids -CRP 32.9>>9.6>>6.1 -Patient was started on steroids a the time of admission>>last dose 10/02/23   Presumed bacterial pneumonia -Patient with elevated leukocytosis and also elevated procalcitonin -PCT 2.05 -initially ceftriaxone and azithro -Continue mucolytic's, flutter valve and bronchodilator as mentioned above -switched over to zosyn by surgery for anaerobic coverage for gallbladder issue   Cholecystitis with concern for abscess -10/03/2023 CT abdomen--distended gallbladder with pericholecystic stranding and loculated pericholecystic fluid in the right subhepatic space -Appreciate General Surgery, case discussed with Dr. Robyne Peers -Patient remains afebrile hemodynamically stable with minimal right upper quadrant pain -IR for cholecystotomy tube +/-drainage of abscess on 12/16   ESRD -Appreciate nephrology -last HD 10/04/23 -usually on MWF   Pyuria/UTI -sent urine culture 12/11--multiple organisms -remains on IV zosyn    Diabetes mellitus type 2 -08/14/2023 hemoglobin A1c 5.6 -Continue NovoLog sliding scale -restarted reduced dose Semglee 15 units daily 12/16  -CBGs elevated due to steroids>>now hypoglycemic -change to sensitive sliding scale  CBG (last 3)  Recent Labs    10/05/23 2035 10/06/23 0735 10/06/23 1135  GLUCAP 354* 268* 206*   hypokalemia -Repleted -Continue to follow electrolytes -correction on HD    hypertension/hyperlipidemia -Continue statins and metoprolol -12/14--now hold statin with mild LFT elevation   BPH -Continue Flomax.  increased intraocular pressure/glaucoma -Continue the use of latanoprost and also dorzolamide/timolol. -Continue outpatient follow-up with ophthalmology service.   depression/anxiety -Continue as needed Xanax and the use of Prozac.   generalized weakness -Most likely in the setting of pneumonia/COVID -PT evaluation>>SNF   anemia of chronic kidney disease -Stable hemoglobin - Hgb stable in 9 range, no bleeding. On ESA per nephrology    Diarrhea -c diff neg -check stool pathogen panel--pending -prn loperamide -due to COVID-19 -improved   DVT prophylaxis: sq heparin  Code Status: Full  Family Communication:  Disposition:    Consultants:  Nephrology Surgery IR  Procedures:  Hemodialysis 12/16 IR cholecystostomy drain 12/16 Antimicrobials:  Zosyn>>12/16 Doxycycline 12/16>>  Subjective: Pt reports that he overall feels unwell, he is weak and reports not having as much abdominal pain today; denies chest pain; agreeable to drain placement and agreeable to continued dialysis   Objective: Vitals:   10/06/23 1035 10/06/23 1047 10/06/23 1100 10/06/23 1115  BP: 109/70 106/66 95/66 96/68   Pulse: 70 84 62 79  Resp: 18 16 15 14   Temp: 98 F (36.7 C)     TempSrc: Oral     SpO2: 100%     Weight: 87.5 kg     Height:        Intake/Output Summary (Last 24 hours) at 10/06/2023 1137 Last data filed at 10/06/2023 0818 Gross per 24 hour  Intake 480 ml  Output --  Net 480 ml   Filed Weights   10/04/23 1518 10/04/23 1837 10/06/23 1035  Weight: 87.2 kg 86.2 kg 87.5 kg   Examination:  General exam: chronically ill appearing male; Appears calm and comfortable.    Respiratory system: no rales heard. Respiratory effort normal. Cardiovascular system: normal S1 & S2 heard. No JVD, murmurs, rubs, gallops or clicks. No pedal  edema. Gastrointestinal system: Abdomen is nondistended, soft and nontender. No organomegaly or masses felt. Normal bowel sounds heard. Central nervous system: Alert and oriented to person, place. No focal neurological deficits. Extremities: Symmetric 5 x 5 power. Skin: No rashes, lesions or ulcers. Psychiatry: Judgement and insight appear diminished. Mood & affect flat.   Data Reviewed: I have personally reviewed following labs and imaging studies  CBC: Recent Labs  Lab 10/03/23 0436 10/04/23 0514 10/04/23 1515 10/05/23 0946 10/06/23 0454  WBC 14.9* 23.4* 23.1* 20.7* 16.1*  HGB 9.6* 10.5* 9.4* 10.2* 9.2*  HCT 29.8* 32.5* 28.2* 31.2* 28.7*  MCV 88.7 89.8 88.1 90.2 89.7  PLT 185 178 181 176 208    Basic Metabolic Panel: Recent Labs  Lab 09/30/23 0425 10/02/23 0447 10/04/23 1515 10/05/23 0946 10/06/23 0454  NA 134* 130* 132* 130* 130*  K 3.2* 3.5 3.0* 3.8 3.5  CL 97* 94* 97* 94* 94*  CO2 23 23 24 25 24   GLUCOSE 142* 321* 85 282* 276*  BUN 49* 67* 67* 45* 53*  CREATININE 4.90* 4.77* 5.10* 3.86* 4.63*  CALCIUM 8.6* 8.4* 8.5* 8.3* 8.4*  MG 1.9  --   --   --   --   PHOS  --   --  3.3  --   --     CBG: Recent Labs  Lab 10/05/23 1126 10/05/23 1630 10/05/23 2035 10/06/23 0735 10/06/23 1135  GLUCAP 350* 294* 354* 268* 206*    Recent Results (from the past 240 hours)  Resp panel by RT-PCR (RSV, Flu A&B, Covid) Anterior Nasal Swab     Status: Abnormal   Collection Time: 09/29/23 10:05 AM   Specimen: Anterior Nasal Swab  Result Value Ref Range Status   SARS Coronavirus 2 by RT PCR POSITIVE (A) NEGATIVE Final    Comment: (NOTE) SARS-CoV-2 target nucleic acids are DETECTED.  The SARS-CoV-2 RNA is generally detectable in upper respiratory specimens during the acute phase of infection. Positive results are indicative of the presence of the identified virus, but do not rule out bacterial infection or co-infection with other pathogens not detected by the test.  Clinical correlation with patient history and other diagnostic information is necessary to determine patient infection status. The expected result is Negative.  Fact Sheet for Patients: BloggerCourse.com  Fact Sheet for Healthcare Providers: SeriousBroker.it  This test is not yet approved or cleared by the [place] FDA and  has been authorized for detection and/or diagnosis of SARS-CoV-2 by FDA under an Emergency Use Authorization (EUA).  This EUA will remain in effect (meaning this test can be used) for the duration of  the COVID-19 declaration under Section 564(b)(1) of the A ct, 21 U.S.C. section 360bbb-3(b)(1), unless the authorization is terminated or revoked sooner.     Influenza A by PCR NEGATIVE NEGATIVE Final   Influenza B by PCR NEGATIVE NEGATIVE Final    Comment: (NOTE) The Xpert Xpress SARS-CoV-2/FLU/RSV plus assay is intended as an aid in the diagnosis of influenza from Nasopharyngeal swab specimens and should not be used as a sole basis for treatment. Nasal washings and aspirates are unacceptable for Xpert Xpress SARS-CoV-2/FLU/RSV testing.  Fact Sheet for Patients: BloggerCourse.com  Fact Sheet for Healthcare Providers: SeriousBroker.it  This test is not yet approved or cleared by the [place] FDA and has been authorized for detection and/or diagnosis of SARS-CoV-2 by FDA under an Emergency Use Authorization (EUA). This EUA will remain in effect (meaning this test can be used) for the duration of the COVID-19 declaration under Section 564(b)(1) of the Act, 21 U.S.C. section 360bbb-3(b)(1), unless the authorization is terminated or revoked.     Resp Syncytial Virus by PCR NEGATIVE NEGATIVE Final    Comment: (NOTE) Fact Sheet for Patients: BloggerCourse.com  Fact Sheet for Healthcare  Providers: SeriousBroker.it  This test is not yet approved or cleared by the [place] FDA and has been authorized for detection and/or diagnosis of SARS-CoV-2 by FDA under an Emergency Use Authorization (EUA). This EUA will remain in effect (meaning this test can be used) for the duration of the COVID-19 declaration under Section 564(b)(1) of the Act, 21 U.S.C. section 360bbb-3(b)(1), unless the authorization is terminated or revoked.  Performed at [facility], [address], [place], [place] [address]   Urine Culture (for pregnant, neutropenic or urologic patients or patients with an indwelling urinary catheter)     Status: Abnormal   Collection Time: 09/29/23  3:24 PM   Specimen: Urine, Clean Catch  Result Value Ref Range Status   Specimen Description   Final    URINE, CLEAN CATCH Performed at [facility], [address], [place], [place] [address]    Special Requests   Final    NONE Performed at [facility], [address], [place], [place] [address]    Culture MULTIPLE SPECIES PRESENT, SUGGEST RECOLLECTION (A)  Final   Report Status 10/02/2023 FINAL  Final  C Difficile Quick Screen w PCR reflex     Status: None   Collection Time: 10/01/23 11:15 AM   Specimen: STOOL  Result Value Ref Range Status   C Diff antigen NEGATIVE NEGATIVE Final   C Diff toxin NEGATIVE NEGATIVE Final   C Diff  interpretation No C. difficile detected.  Final    Comment: Performed at [facility], [address], [place], [place] [address]  Gastrointestinal Panel by PCR , Stool     Status: None   Collection Time: 10/02/23 10:15 AM   Specimen: Stool  Result Value Ref Range Status   Campylobacter species NOT DETECTED NOT DETECTED Final   Plesimonas shigelloides NOT DETECTED NOT DETECTED Final   Salmonella species NOT DETECTED NOT DETECTED Final   Yersinia enterocolitica NOT DETECTED NOT DETECTED Final   Vibrio species NOT DETECTED NOT DETECTED Final   Vibrio cholerae NOT  DETECTED NOT DETECTED Final   Enteroaggregative E coli (EAEC) NOT DETECTED NOT DETECTED Final   Enteropathogenic E coli (EPEC) NOT DETECTED NOT DETECTED Final   Enterotoxigenic E coli (ETEC) NOT DETECTED NOT DETECTED Final   Shiga like toxin producing E coli (STEC) NOT DETECTED NOT DETECTED Final   Shigella/Enteroinvasive E coli (EIEC) NOT DETECTED NOT DETECTED Final   Cryptosporidium NOT DETECTED NOT DETECTED Final   Cyclospora cayetanensis NOT DETECTED NOT DETECTED Final   Entamoeba histolytica NOT DETECTED NOT DETECTED Final   Giardia lamblia NOT DETECTED NOT DETECTED Final   Adenovirus F40/41 NOT DETECTED NOT DETECTED Final   Astrovirus NOT DETECTED NOT DETECTED Final   Norovirus GI/GII NOT DETECTED NOT DETECTED Final   Rotavirus A NOT DETECTED NOT DETECTED Final   Sapovirus (I, II, IV, and V) NOT DETECTED NOT DETECTED Final    Comment: Performed at [facility], [address] [place], [place] [address]  Culture, blood (Routine X 2) w Reflex to ID Panel     Status: None (Preliminary result)   Collection Time: 10/03/23  2:37 PM   Specimen: BLOOD  Result Value Ref Range Status   Specimen Description BLOOD RIGHT ANTECUBITAL  Final   Special Requests   Final    Blood Culture results may not be optimal due to an inadequate volume of blood received in culture bottles BOTTLES DRAWN AEROBIC ONLY   Culture   Final    NO GROWTH 3 DAYS Performed at [facility], [address], [place], [place] [address]    Report Status PENDING  Incomplete  Culture, blood (Routine X 2) w Reflex to ID Panel     Status: None (Preliminary result)   Collection Time: 10/03/23  2:45 PM   Specimen: BLOOD  Result Value Ref Range Status   Specimen Description BLOOD BLOOD LEFT HAND  Final   Special Requests   Final    Blood Culture results may not be optimal due to an inadequate volume of blood received in culture bottles BOTTLES DRAWN AEROBIC ONLY   Culture   Final    NO GROWTH 3 DAYS Performed at  [facility], [address] [place], [place] [address]    Report Status PENDING  Incomplete     Radiology Studies: No results found.  Scheduled Meds:  calcitRIOL  0.25 mcg Oral Daily   Chlorhexidine Gluconate Cloth  6 each Topical Daily   Chlorhexidine Gluconate Cloth  6 each Topical Q0600   cycloSPORINE  1 drop Both Eyes BID   dorzolamide-timolol  1 drop Both Eyes BID   FLUoxetine  20 mg Oral QHS   folic acid  1,000 mcg Oral Daily   heparin  2,000 Units Dialysis Once in dialysis   heparin  5,000 Units Subcutaneous Q8H   insulin aspart  0-6 Units Subcutaneous TID WC   insulin glargine-yfgn  15 Units Subcutaneous Daily   latanoprost  1 drop Left Eye QHS   multivitamin  1 tablet Oral QHS   sodium chloride flush  3 mL Intravenous Q12H   Continuous Infusions:  piperacillin-tazobactam (ZOSYN)  IV       LOS: 6 days   Time spent: 41 mins  Miles Leyda Laural Benes, MD How to contact the [facility] Attending or Consulting provider 7A - 7P or covering provider during after hours 7P -7A, for this patient?  Check the care team in [place] and look for a) attending/consulting TRH provider listed and b) the [facility] team listed Log into [email or web address] to find provider on call.  Locate the [facility] provider you are looking for under [facility] and page to a number that you can be directly reached. If you still have difficulty reaching the provider, please page the [facility] (Director on Call) for the Hospitalists listed on amion for assistance.  10/06/2023, 11:37 AM

## 2023-10-06 NOTE — Progress Notes (Signed)
   HEMODIALYSIS TREATMENT NOTE:  3h heparin-free treatment completed using RIJ TDC. Kept even / no UF d/t soft BPs.  All blood was returned.  Pt was picked up from [facility] by [facility] for transport to [facility].  Post-HD:  10/06/23 1335  Vitals  Temp 97.8 F (36.6 C)  Temp Source Axillary  BP 138/77  MAP (mmHg) 96  BP Location Right Arm  BP Method Automatic  Patient Position (if appropriate) Lying  Pulse Rate 64  Pulse Rate Source Monitor  Resp 18  Oxygen Therapy  SpO2 100 %  O2 Device Room Air  Post Treatment  Dialyzer Clearance Clear  Hemodialysis Intake (mL) 0 mL  Liters Processed 62.1  Fluid Removed (mL) 0 mL  Tolerated HD Treatment No (Comment)  Post-Hemodialysis Comments UF limited by hypotension again.  Net UF 0  Hemodialysis Catheter Right Subclavian Double lumen Permanent (Tunneled)  Placement Date/Time: 08/15/23 0820   Serial / Lot #: [id number]  Expiration Date: 04/27/28  Time Out: Correct patient;Correct site;Correct procedure  Maximum sterile barrier precautions: Hand hygiene;Cap;Mask;Sterile gown;Sterile gloves;Large sterile ...  Site Condition No complications  Blue Lumen Status Flushed;Heparin locked;Dead end cap in place  Red Lumen Status Flushed;Heparin locked;Dead end cap in place  Purple Lumen Status N/A  Catheter fill solution Heparin 1000 units/ml  Catheter fill volume (Arterial) 1.9 cc  Catheter fill volume (Venous) 1.9  Dressing Type Transparent;Tube stabilization device  Dressing Status Antimicrobial disc in place;Clean, Dry, Intact  Interventions New dressing  Drainage Description None  Dressing Change Due 10/13/23  Post treatment catheter status Capped and Clamped    Arman Filter, RN AP [facility]

## 2023-10-06 NOTE — TOC Progression Note (Signed)
Transition of Care [facility]) - Progression Note    Patient Details  Name: Philip Benjamin MRN: [id number] Date of Birth: 1951-08-20  [facility]) CM/SW Contact  Karn [place], [place] Phone Number: 10/06/2023, 1:10 PM  Clinical Narrative:  Pt's daughter now requesting return to [facility]. Facility can accept. Will be able to admit on Friday, December 20 as long as no symptoms.       Expected Discharge Plan: Skilled Nursing Facility Barriers to Discharge: Continued Medical Work up  Expected Discharge Plan and Services In-house Referral: Clinical Social Work   Post Acute Care Choice: Skilled Nursing Facility Living arrangements for the past 2 months: Single Family Home Expected Discharge Date: 10/03/23                         HH Arranged: PT, RN HH Agency: [facility] Date [facility] Agency Contacted: 09/30/23 Time HH Agency Contacted: [phone] Representative spoke with at [facility] Agency: Clydie Braun   Social Determinants of Health (SDOH) Interventions SDOH Screenings   Food Insecurity: No Food Insecurity (09/29/2023)  Housing: Patient Declined (09/29/2023)  Transportation Needs: No Transportation Needs (09/29/2023)  Utilities: Not At Risk (09/29/2023)  Tobacco Use: Low Risk  (09/29/2023)  Health Literacy: Medium Risk (09/05/2023)   Received from [facility]    Readmission Risk Interventions    10/01/2023    7:48 AM 08/22/2023   12:48 PM 08/15/2023   12:29 PM  Readmission Risk Prevention Plan  Transportation Screening Complete Complete Complete  Home Care Screening  Complete Complete  Medication Review (RN CM)  Complete Complete  HRI or Home Care Consult Complete    Social Work Consult for Recovery Care Planning/Counseling Complete    Palliative Care Screening Not Applicable    Medication Review Oceanographer) Complete

## 2023-10-06 NOTE — Consult Note (Signed)
Chief Complaint: Patient was seen in consultation today for cholecystitis  Referring Physician(s): Tat, Onalee Hua, MD  Supervising Physician: Malachy Moan  Patient Status: [facility] - inpt, transferred to [facility] for procedures  History of Present Illness: Philip Benjamin is a 72 y.o. male with a past medical history significant BPH, DM, ESRD on HD, HTN, bilateral hearing loss, blindness who presented to [facility] ED 09/29/23 with complaints of fatigue, weakness, nausea, cough and dyspnea. He was found to be COVID-19 positive in the ED with likely super imposed bacterial pneumonia and was placed on IV steroids and abx. Later during this admission he began to experience RUQ pain and worsening leukocytosis. A CT abd/pelvis w/o contrast was obtained which showed:  1. Acute cholecystitis. Loculated pericholecystic fluid within the right subhepatic space along the peritoneal wall, new since prior examination. Gallbladder perforation and pericholecystic abscess formation is not excluded. Correlation with liver enzymes is recommended. 2. Extensive multi-vessel coronary artery calcification. 3. Chronic left lower lobe consolidation and volume loss with traction bronchiectasis. Interval development of platelike atelectasis within the right lower lobe with marked volume loss. Similar, though milder changes are noted within the basilar right middle lobe. 4. Mild sigmoid diverticulosis. 5. Mild prostatic hypertrophy  General surgery at [facility] was consulted and due to his current admission for pneumonia and relatively new ESRD he was deemed a poor surgical candidate.  IR has been consulted for percutaneous cholecystostomy placement as well as possible aspiration of the pericholecystic fluid collection.   Past Medical History:  Diagnosis Date   BPH (benign prostatic hyperplasia)    Chronic kidney disease    ckd stage 3   Diabetes mellitus without complication (HCC)    Hypertension     Past Surgical  History:  Procedure Laterality Date   EYE SURGERY     INGUINAL HERNIA REPAIR Bilateral 01/23/2018   Procedure: BILATERAL OPEN INGUINAL HERNIA REPAIR WITH MESH;  Surgeon: Jimmye Norman, MD;  Location: [facility] OR;  Service: General;  Laterality: Bilateral;   INSERTION OF MESH Bilateral 01/23/2018   Procedure: INSERTION OF MESH;  Surgeon: Jimmye Norman, MD;  Location: [facility] OR;  Service: General;  Laterality: Bilateral;   IR FLUORO GUIDE CV LINE RIGHT  08/15/2023   IR US GUIDE VASC ACCESS RIGHT  08/15/2023   VASECTOMY     ~8119    Allergies: Patient has no known allergies.  Medications: Prior to Admission medications   Medication Sig Start Date End Date Taking? Authorizing Provider  acetaminophen (TYLENOL) 650 MG CR tablet Take 650 mg by mouth every 6 (six) hours as needed for pain.   Yes [provider]  calcitRIOL (ROCALTROL) 0.25 MCG capsule Take 0.25 mcg by mouth daily.   Yes [provider]  Cholecalciferol 125 MCG (5000 UT) capsule Take by mouth.   Yes [provider]  cycloSPORINE (RESTASIS) 0.05 % ophthalmic emulsion SMARTSIG:In Eye(s) 08/13/23  Yes [provider]  Dorzolamide HCl-Timolol Mal PF 2-0.5 % SOLN Apply 1 drop to eye 2 (two) times daily. 08/18/23  Yes [provider]  FLUoxetine (PROZAC) 20 MG/5ML solution Take 5 mLs by mouth daily. 09/17/23 10/17/23 Yes [provider]  folic acid (FOLVITE) 800 MCG tablet Take 800 mcg by mouth daily.   Yes [provider]  LANTUS 100 UNIT/ML injection Inject 26 Units into the skin daily.   Yes [provider]  latanoprost (XALATAN) 0.005 % ophthalmic solution Place 1 drop into both eyes at bedtime. Patient taking differently: Place 1 drop into the  left eye at bedtime. 08/25/23  Yes Tat, Onalee Hua, MD  losartan (COZAAR) 100 MG tablet Take 100 mg by mouth daily.   Yes [provider]  metoprolol tartrate (LOPRESSOR) 25 MG tablet Take 0.5 tablets (12.5 mg total) by mouth 2  (two) times daily. 08/20/23  Yes Tat, Onalee Hua, MD  multivitamin (RENA-VIT) TABS tablet Take 1 tablet by mouth at bedtime. 08/20/23  Yes Tat, Onalee Hua, MD  nystatin cream (MYCOSTATIN) Apply 1 application topically 2 (two) times daily as needed. FOR YEAST OR SKIN RASHES. 12/16/17  Yes [provider]  pravastatin (PRAVACHOL) 80 MG tablet Take 1 tablet by mouth daily. 09/17/23 10/17/23 Yes [provider]  tamsulosin (FLOMAX) 0.4 MG CAPS capsule Take 0.4 mg by mouth daily with supper. 12/16/17  Yes [provider]     History reviewed. No pertinent family history.  Social History   Socioeconomic History   Marital status: Married    Spouse name: Not on file   Number of children: Not on file   Years of education: Not on file   Highest education level: Not on file  Occupational History   Not on file  Tobacco Use   Smoking status: Never   Smokeless tobacco: Never  Substance and Sexual Activity   Alcohol use: Never   Drug use: Never   Sexual activity: Not on file  Other Topics Concern   Not on file  Social History Narrative   Not on file   Social Drivers of Health   Financial Resource Strain: Not on file  Food Insecurity: No Food Insecurity (09/29/2023)   Hunger Vital Sign    Worried About Running Out of Food in the Last Year: Never true    Ran Out of Food in the Last Year: Never true  Transportation Needs: No Transportation Needs (09/29/2023)   PRAPARE - Administrator, Civil Service (Medical): No    Lack of Transportation (Non-Medical): No  Physical Activity: Not on file  Stress: Not on file  Social Connections: Not on file     Review of Systems: A 12 point ROS discussed and pertinent positives are indicated in the HPI above.  All other systems are negative.  Review of Systems  Constitutional:  Positive for fatigue. Negative for chills and fever.  Respiratory:  Negative for cough and shortness of breath.   Cardiovascular:  Negative for chest  pain.  Gastrointestinal:  Positive for abdominal pain. Negative for diarrhea, nausea and vomiting.  Musculoskeletal:  Negative for back pain.  Neurological:  Negative for dizziness and headaches.    Vital Signs: BP 138/77 (BP Location: Right Arm)   Pulse 64   Temp 97.8 F (36.6 C) (Axillary)   Resp 18   Ht 6\' 1"  (1.854 m)   Wt 192 lb 7.4 oz (87.3 kg)   SpO2 100%   BMI 25.39 kg/m   Physical Exam Vitals and nursing note reviewed.  Constitutional:      General: He is not in acute distress. HENT:     Head: Normocephalic.     Mouth/Throat:     Mouth: Mucous membranes are moist.     Pharynx: Oropharynx is clear. No oropharyngeal exudate or posterior oropharyngeal erythema.  Cardiovascular:     Rate and Rhythm: Normal rate and regular rhythm.  Pulmonary:     Effort: Pulmonary effort is normal.     Breath sounds: Normal breath sounds.  Abdominal:     General: There is no distension.     Palpations:  Abdomen is soft.  Skin:    General: Skin is warm and dry.     Coloration: Skin is not jaundiced.  Neurological:     Mental Status: He is alert and oriented to person, place, and time.  Psychiatric:        Mood and Affect: Mood normal.        Behavior: Behavior normal.        Thought Content: Thought content normal.        Judgment: Judgment normal.      MD Evaluation Airway: WNL Heart: WNL Abdomen: WNL Chest/ Lungs: WNL ASA  Classification: 3 Mallampati/Airway Score: Two   Imaging: CT CHEST ABDOMEN PELVIS WO CONTRAST Result Date: 10/04/2023 CLINICAL DATA:  Upper abdominal pain, weakness, COVID pneumonia EXAM: CT CHEST, ABDOMEN AND PELVIS WITHOUT CONTRAST TECHNIQUE: Multidetector CT imaging of the chest, abdomen and pelvis was performed following the standard protocol without IV contrast. RADIATION DOSE REDUCTION: This exam was performed according to the departmental dose-optimization program which includes automated exposure control, adjustment of the mA and/or kV  according to patient size and/or use of iterative reconstruction technique. COMPARISON:  08/21/2023 FINDINGS: CT CHEST FINDINGS Cardiovascular: Extensive multi-vessel coronary artery calcification. Global cardiac size within normal limits. No pericardial effusion. Central pulmonary arteries are of normal caliber. Thoracic aorta is unremarkable. Right internal jugular hemodialysis catheter tip noted within the low right atrium at the hepatocaval junction. Mediastinum/Nodes: No enlarged mediastinal, hilar, or axillary lymph nodes. Thyroid gland, trachea, and esophagus demonstrate no significant findings. Lungs/Pleura: Left lower lobe chronic consolidation and volume loss is again identified with traction bronchiectasis. Platelike atelectasis has developed within the right lower lobe with marked volume loss. Traction bronchiectasis is noted suggesting some degree fibrotic change. Similar, though milder changes are noted within the basilar right middle lobe. No superimposed confluent pulmonary infiltrate. No pneumothorax or pleural effusion. Musculoskeletal: No chest wall mass or suspicious bone lesions identified. CT ABDOMEN PELVIS FINDINGS Hepatobiliary: The gallbladder is distended and there is pericholecystic inflammatory stranding in keeping with changes of acute cholecystitis. There is loculated pericholecystic fluid seen within the right subhepatic space along the peritoneal wall, new since prior examination. Gallbladder perforation and pericholecystic abscess formation is not excluded. This measures 5.5 x 2.1 cm at axial image # 72/5. The liver is unremarkable. No intra or extrahepatic biliary ductal dilation. Pancreas: Unremarkable Spleen: Unremarkable Adrenals/Urinary Tract: Adrenal glands are unremarkable. Kidneys are normal, without renal calculi, focal lesion, or hydronephrosis. Bladder is unremarkable. Stomach/Bowel: Stomach is within normal limits. Appendix appears normal. No evidence of bowel wall  thickening, distention, or inflammatory changes. Mild sigmoid diverticulosis. Vascular/Lymphatic: Aortic atherosclerosis. No enlarged abdominal or pelvic lymph nodes. Reproductive: Mild prostatic hypertrophy Other: Tiny fat containing umbilical hernia. No abdominopelvic ascites. Musculoskeletal: Osseous structures are age-appropriate. No acute bone abnormality. IMPRESSION: 1. Acute cholecystitis. Loculated pericholecystic fluid within the right subhepatic space along the peritoneal wall, new since prior examination. Gallbladder perforation and pericholecystic abscess formation is not excluded. Correlation with liver enzymes is recommended. 2. Extensive multi-vessel coronary artery calcification. 3. Chronic left lower lobe consolidation and volume loss with traction bronchiectasis. Interval development of platelike atelectasis within the right lower lobe with marked volume loss. Similar, though milder changes are noted within the basilar right middle lobe. 4. Mild sigmoid diverticulosis. 5. Mild prostatic hypertrophy. Aortic Atherosclerosis (ICD10-I70.0). Electronically Signed   By: Helyn Numbers M.D.   On: 10/04/2023 00:08   NM Pulmonary Perfusion Result Date: 10/03/2023 CLINICAL DATA:  Chest pain, dyspnea, COVID-19 pneumonia EXAM:  NUCLEAR MEDICINE PERFUSION LUNG SCAN TECHNIQUE: Perfusion images were obtained in multiple projections after intravenous injection of radiopharmaceutical. Ventilation scans intentionally deferred if perfusion scan and chest x-ray adequate for interpretation during COVID 19 epidemic. RADIOPHARMACEUTICALS:  4.4 mCi Tc-92m MAA IV COMPARISON:  Chest CT 4:34 p.m. FINDINGS: Bilateral lower lobe volume loss corresponds to atelectasis noted on accompanying chest CT. No focal perfusion defects are identified no extra pulmonary radiotracer uptake identified. IMPRESSION: No scintigraphic evidence of acute pulmonary embolism. Electronically Signed   By: Helyn Numbers M.D.   On: 10/03/2023 20:44    DG Chest 2 View Result Date: 09/29/2023 CLINICAL DATA:  Weakness, nausea. EXAM: CHEST - 2 VIEW COMPARISON:  Same day. FINDINGS: Stable cardiomediastinal silhouette. Right internal jugular dialysis catheter is unchanged. Hypoinflation of the lungs is noted with probable bibasilar subsegmental atelectasis and possible small pleural effusions. Bony thorax is unremarkable. IMPRESSION: Hypoinflation of lungs with probable bibasilar subsegmental atelectasis and possible small pleural effusions. Electronically Signed   By: Lupita Raider M.D.   On: 09/29/2023 12:53   DG Chest Portable 1 View Result Date: 09/29/2023 CLINICAL DATA:  ?Pna Pt complains of generalized weakness, nausea, and loss of appetite x 1 week. Pt states he was discharged from SNF around a week ago EXAM: PORTABLE CHEST 1 VIEW COMPARISON:  Chest x-ray 08/14/2023 FINDINGS: Right chest wall dialysis catheter with tip overlying the right atrium. The heart and mediastinal contours are within normal limits. Low lung volumes. Bibasilar airspace opacities. No pulmonary edema. Likely trace bilateral pleural effusion. No pneumothorax. No acute osseous abnormality. IMPRESSION: 1. Low lung volumes with bibasilar airspace opacities that could represent a combination of atelectasis and/or infection/inflammation. Recommend repeat PA and lateral view with improved inspiratory effort for further evaluation. 2. Likely trace bilateral pleural effusions. Electronically Signed   By: Tish Frederickson M.D.   On: 09/29/2023 09:55    Labs:  CBC: Recent Labs    10/04/23 0514 10/04/23 1515 10/05/23 0946 10/06/23 0454  WBC 23.4* 23.1* 20.7* 16.1*  HGB 10.5* 9.4* 10.2* 9.2*  HCT 32.5* 28.2* 31.2* 28.7*  PLT 178 181 176 208    COAGS: Recent Labs    10/05/23 0904  INR 1.2    BMP: Recent Labs    10/02/23 0447 10/04/23 1515 10/05/23 0946 10/06/23 0454  NA 130* 132* 130* 130*  K 3.5 3.0* 3.8 3.5  CL 94* 97* 94* 94*  CO2 23 24 25 24   GLUCOSE 321*  85 282* 276*  BUN 67* 67* 45* 53*  CALCIUM 8.4* 8.5* 8.3* 8.4*  CREATININE 4.77* 5.10* 3.86* 4.63*  GFRNONAA 12* 11* 16* 13*    LIVER FUNCTION TESTS: Recent Labs    10/02/23 0447 10/04/23 1515 10/05/23 0946 10/06/23 0454  BILITOT 0.5 0.5 1.0 0.8  AST 33 47* 48* 41  ALT 32 61* 64* 58*  ALKPHOS 107 105 101 90  PROT 5.8* 5.7* 5.5* 5.4*  ALBUMIN 2.3* 2.2*  2.2* 2.2* 2.1*    TUMOR MARKERS: No results for input(s): "AFPTM", "CEA", "CA199", "CHROMGRNA" in the last 8760 hours.  Assessment and Plan:  72 y/o M admitted to [facility] with COVID-19 and bacterial pneumonia 09/29/23 being treated with IV antibiotics and steroids who developed abdominal pain and found to have acute cholecystitis with small pericholecystic fluid collection. He has been deemed a poor surgical candidate and IR has been consulted for percutaneous cholecystostomy placement and possible aspiration of pericholecystic fluid collection.   Risks and benefits were discussed with the patient including, but not  limited to, bleeding, infection, gallbladder perforation, bile leak, sepsis or even death.  All of the patient's questions were answered, patient is agreeable to proceed.  Consent signed and in chart.  IR recommendations are as below:  Percutaneous cholecystostomy drain to remain in place at least 6 weeks.   Recommend fluoroscopy with injection of the drain in IR to evaluate for patency of the cystic duct.  If the duct is patent and general surgery feels patient is stable for cholecystectomy, the drain would be removed at time of surgery.  If the duct is patent and general surgery feels patient is NEVER a candidate for cholecystectomy, drain can be capped for a trial.  If symptoms recur, then place to gravity bag again.  If trial is successful, discuss possible removal of the drain.  If trial in unsuccessful, then patient will need routine exchanges of the  chole tube about every 8-10 weeks.  Please call the  IR PA at [phone] when patient is about to be discharged and we will arrange the follow up drain injection (ok to leave message).    Thank you for this interesting consult.  I greatly enjoyed meeting ETHYN WENDEROTH and look forward to participating in their care.  A copy of this report was sent to the requesting provider on this date.  Electronically Signed: Villa Herb, PA-C 10/06/2023, 2:41 PM   I spent a total of 40 Minutes in face to face in clinical consultation, greater than 50% of which was counseling/coordinating care for acute cholecystitis.

## 2023-10-07 ENCOUNTER — Other Ambulatory Visit: Payer: Medicare Other

## 2023-10-07 ENCOUNTER — Encounter: Payer: Medicare Other | Admitting: Vascular Surgery

## 2023-10-07 DIAGNOSIS — E1165 Type 2 diabetes mellitus with hyperglycemia: Secondary | ICD-10-CM | POA: Diagnosis not present

## 2023-10-07 DIAGNOSIS — K810 Acute cholecystitis: Secondary | ICD-10-CM | POA: Diagnosis not present

## 2023-10-07 DIAGNOSIS — R531 Weakness: Secondary | ICD-10-CM | POA: Diagnosis not present

## 2023-10-07 DIAGNOSIS — U071 COVID-19: Secondary | ICD-10-CM | POA: Diagnosis not present

## 2023-10-07 LAB — CBC WITH DIFFERENTIAL/PLATELET
Abs Immature Granulocytes: 0.09 10*3/uL — ABNORMAL HIGH (ref 0.00–0.07)
Basophils Absolute: 0 10*3/uL (ref 0.0–0.1)
Basophils Relative: 0 %
Eosinophils Absolute: 0.2 10*3/uL (ref 0.0–0.5)
Eosinophils Relative: 2 %
HCT: 28.8 % — ABNORMAL LOW (ref 39.0–52.0)
Hemoglobin: 9.1 g/dL — ABNORMAL LOW (ref 13.0–17.0)
Immature Granulocytes: 1 %
Lymphocytes Relative: 10 %
Lymphs Abs: 0.9 10*3/uL (ref 0.7–4.0)
MCH: 28.7 pg (ref 26.0–34.0)
MCHC: 31.6 g/dL (ref 30.0–36.0)
MCV: 90.9 fL (ref 80.0–100.0)
Monocytes Absolute: 0.9 10*3/uL (ref 0.1–1.0)
Monocytes Relative: 11 %
Neutro Abs: 6.4 10*3/uL (ref 1.7–7.7)
Neutrophils Relative %: 76 %
Platelets: 202 10*3/uL (ref 150–400)
RBC: 3.17 MIL/uL — ABNORMAL LOW (ref 4.22–5.81)
RDW: 12.8 % (ref 11.5–15.5)
WBC: 8.4 10*3/uL (ref 4.0–10.5)
nRBC: 0 % (ref 0.0–0.2)

## 2023-10-07 LAB — COMPREHENSIVE METABOLIC PANEL
ALT: 61 U/L — ABNORMAL HIGH (ref 0–44)
AST: 46 U/L — ABNORMAL HIGH (ref 15–41)
Albumin: 2 g/dL — ABNORMAL LOW (ref 3.5–5.0)
Alkaline Phosphatase: 81 U/L (ref 38–126)
Anion gap: 11 (ref 5–15)
BUN: 34 mg/dL — ABNORMAL HIGH (ref 8–23)
CO2: 27 mmol/L (ref 22–32)
Calcium: 8.3 mg/dL — ABNORMAL LOW (ref 8.9–10.3)
Chloride: 96 mmol/L — ABNORMAL LOW (ref 98–111)
Creatinine, Ser: 3.74 mg/dL — ABNORMAL HIGH (ref 0.61–1.24)
GFR, Estimated: 16 mL/min — ABNORMAL LOW (ref >60)
Glucose, Bld: 252 mg/dL — ABNORMAL HIGH (ref 70–99)
Potassium: 3.6 mmol/L (ref 3.5–5.1)
Sodium: 134 mmol/L — ABNORMAL LOW (ref 135–145)
Total Bilirubin: 0.6 mg/dL (ref <1.2)
Total Protein: 5.2 g/dL — ABNORMAL LOW (ref 6.5–8.1)

## 2023-10-07 LAB — GLUCOSE, CAPILLARY
Glucose-Capillary: 222 mg/dL — ABNORMAL HIGH (ref 70–99)
Glucose-Capillary: 313 mg/dL — ABNORMAL HIGH (ref 70–99)
Glucose-Capillary: 190 mg/dL — ABNORMAL HIGH (ref 70–99)
Glucose-Capillary: 91 mg/dL (ref 70–99)

## 2023-10-07 LAB — MAGNESIUM: Magnesium: 1.9 mg/dL (ref 1.7–2.4)

## 2023-10-07 MED ORDER — INSULIN GLARGINE-YFGN 100 UNIT/ML ~~LOC~~ SOLN
18.0000 [IU] | Freq: Every day | SUBCUTANEOUS | Status: DC
  Administered 2023-10-07 – 2023-10-10 (×4): 18 [IU] via SUBCUTANEOUS
  Filled 2023-10-07 (×5): qty 0.18

## 2023-10-07 MED ORDER — INSULIN ASPART 100 UNIT/ML IJ SOLN
6.0000 [IU] | Freq: Three times a day (TID) | INTRAMUSCULAR | Status: DC
  Administered 2023-10-09: 6 [IU] via SUBCUTANEOUS

## 2023-10-07 MED ORDER — SODIUM CHLORIDE 0.9% FLUSH
5.0000 mL | Freq: Three times a day (TID) | INTRAVENOUS | Status: DC
  Administered 2023-10-07: 5 mL

## 2023-10-07 MED ORDER — INSULIN GLARGINE-YFGN 100 UNIT/ML ~~LOC~~ SOLN
20.0000 [IU] | Freq: Every day | SUBCUTANEOUS | Status: DC
  Filled 2023-10-07 (×2): qty 0.2

## 2023-10-07 MED ORDER — DARBEPOETIN ALFA 60 MCG/0.3ML IJ SOSY
60.0000 ug | PREFILLED_SYRINGE | INTRAMUSCULAR | Status: DC
  Administered 2023-10-08: 60 ug via SUBCUTANEOUS
  Filled 2023-10-07: qty 0.3

## 2023-10-07 MED ORDER — INSULIN ASPART 100 UNIT/ML IJ SOLN
5.0000 [IU] | Freq: Three times a day (TID) | INTRAMUSCULAR | Status: DC
  Administered 2023-10-07 (×2): 5 [IU] via SUBCUTANEOUS

## 2023-10-07 MED ORDER — SODIUM CHLORIDE 0.9 % IV SOLN
2.2500 g | Freq: Three times a day (TID) | INTRAVENOUS | Status: DC
  Administered 2023-10-07 – 2023-10-09 (×7): 2.25 g via INTRAVENOUS
  Filled 2023-10-07 (×10): qty 10

## 2023-10-07 NOTE — Progress Notes (Signed)
Physical Therapy Treatment Patient Details Name: Philip Benjamin MRN: [id number] DOB: 09-Apr-1951 Today's Date: 10/07/2023   History of Present Illness 72 year old male with a history of hypertension, diabetes mellitus, hyperlipidemia, newly diagnosed ESRD, and anemia of CKD     The patient was recently hospitalized from 08/14/2023-08/20/2023 where he was declared new ESRD and initiated on dialysis. TDC was placed on 08/15/2023, and the patient was initiated on HD.       secondary to general malaise, nausea and weakness; patient has also expressed intermittent nonproductive coughing spells and mild shortness of breath.  Patient denies chills, fever, overt bleeding, focal weaknesses, headache or any other complaints.     Of note, patient has been released from the skilled nursing facility about 1 week prior to this hospitalization.     Workup in the ED demonstrated positive COVID-19 infection, mild dehydration and hypokalemia.    PT Comments  Patient agreeable and motivated for therapy.  Patient demonstrates fair/good return for sitting up at bedside with [facility] partially raised, completing BLE exercises while seated at bedside, slow labored movement during ambulation in room with occasion buckling of knees and mild difficulty clearing toes.  Patient tolerated sitting up in chair after therapy - nursing staff notified.  Patient will benefit from continued skilled physical therapy in hospital and recommended venue below to increase strength, balance, endurance for safe ADLs and gait.      If plan is discharge home, recommend the following: A lot of help with walking and/or transfers;A little help with bathing/dressing/bathroom;Help with stairs or ramp for entrance;Assistance with cooking/housework   Can travel by private vehicle     Yes  Equipment Recommendations  None recommended by PT    Recommendations for Other Services       Precautions / Restrictions Precautions Precautions:  Fall Restrictions Weight Bearing Restrictions Per Provider Order: No     Mobility  Bed Mobility Overal bed mobility: Needs Assistance Bed Mobility: Supine to Sit     Supine to sit: Supervision, HOB elevated     General bed mobility comments: good return for sitting up at bedside with HOB partially raised    Transfers Overall transfer level: Needs assistance Equipment used: Rolling walker (2 wheels) Transfers: Sit to/from Stand, Bed to chair/wheelchair/BSC Sit to Stand: Min assist, Mod assist   Step pivot transfers: Min assist, Mod assist       General transfer comment: slow labored unsteady movement    Ambulation/Gait Ambulation/Gait assistance: Mod assist Gait Distance (Feet): 20 Feet Assistive device: Rolling walker (2 wheels) Gait Pattern/deviations: Decreased step length - right, Decreased step length - left, Decreased stride length, Steppage, Knees buckling Gait velocity: slow     General Gait Details: slow labored unsteady cadence with frequent steppage like gait with legs and occasional buckling of knees, limited mostly due to fatigue   Stairs             Wheelchair Mobility     Tilt Bed    Modified Rankin (Stroke Patients Only)       Balance Overall balance assessment: Needs assistance Sitting-balance support: Feet supported, No upper extremity supported Sitting balance-Leahy Scale: Fair Sitting balance - Comments: fair/good seated at EOB   Standing balance support: Reliant on assistive device for balance, During functional activity, Bilateral upper extremity supported Standing balance-Leahy Scale: Poor Standing balance comment: fair/poor using RW  Cognition Arousal: Alert Behavior During Therapy: WFL for tasks assessed/performed Overall Cognitive Status: Within Functional Limits for tasks assessed                                          Exercises General Exercises - Lower  Extremity Long Arc Quad: Seated, AROM, Strengthening, Both, 10 reps Hip Flexion/Marching: Seated, AROM, Strengthening, Both, 10 reps Toe Raises: Seated, AROM, Strengthening, Both, 10 reps Heel Raises: Seated, AROM, Strengthening, Both, 10 reps    General Comments        Pertinent Vitals/Pain Pain Assessment Pain Assessment: No/denies pain    Home Living                          Prior Function            PT Goals (current goals can now be found in the care plan section) Acute Rehab PT Goals Patient Stated Goal: return home PT Goal Formulation: With patient Time For Goal Achievement: 10/16/23 Potential to Achieve Goals: Good Progress towards PT goals: Progressing toward goals    Frequency    Min 3X/week      PT Plan      Co-evaluation              AM-PAC PT "6 Clicks" Mobility   Outcome Measure  Help needed turning from your back to your side while in a flat bed without using bedrails?: None Help needed moving from lying on your back to sitting on the side of a flat bed without using bedrails?: None Help needed moving to and from a bed to a chair (including a wheelchair)?: A Little Help needed standing up from a chair using your arms (e.g., wheelchair or bedside chair)?: A Little Help needed to walk in hospital room?: A Lot Help needed climbing 3-5 steps with a railing? : A Lot 6 Click Score: 18    End of Session   Activity Tolerance: Patient tolerated treatment well;Patient limited by fatigue Patient left: in chair;with call bell/phone within reach Nurse Communication: Mobility status PT Visit Diagnosis: Unsteadiness on feet (R26.81);Other abnormalities of gait and mobility (R26.89);Muscle weakness (generalized) (M62.81)     Time: 4098-1191 PT Time Calculation (min) (ACUTE ONLY): 21 min  Charges:    $Therapeutic Exercise: 8-22 mins $Therapeutic Activity: 8-22 mins PT General Charges $$ ACUTE PT VISIT: 1 Visit                      3:47 PM, 10/07/23 Ocie Bob, MPT Physical Therapist with [facility] [phone] office [phone] mobile phone

## 2023-10-07 NOTE — Progress Notes (Addendum)
Patient ID: Philip Benjamin, male   DOB: 09/15/1951, 72 y.o.   MRN: [id number] S: Reports pain is better this morning.  No events overnight. O:BP 132/69 (BP Location: Right Arm)   Pulse 78   Temp 97.7 F (36.5 C) (Oral)   Resp 19   Ht 6\' 1"  (1.854 m)   Wt 87.3 kg   SpO2 100%   BMI 25.39 kg/m   Intake/Output Summary (Last 24 hours) at 10/07/2023 0950 Last data filed at 10/07/2023 0826 Gross per 24 hour  Intake 580 ml  Output 190 ml  Net 390 ml   Intake/Output: I/O last 3 completed shifts: In: 340 [P.O.:240; IV Piggyback:100] Out: 190 [Drains:190]  Intake/Output this shift:  Total I/O In: 240 [P.O.:240] Out: -  Weight change:  Gen: NAD CVS: RRR Resp:CTA Abd: +BS, soft, NT/nD, cholecystostomy tube in place Ext: no edema  Recent Labs  Lab 10/02/23 0447 10/04/23 1515 10/05/23 0946 10/06/23 0454 10/07/23 0430  NA 130* 132* 130* 130* 134*  K 3.5 3.0* 3.8 3.5 3.6  CL 94* 97* 94* 94* 96*  CO2 23 24 25 24 27   GLUCOSE 321* 85 282* 276* 252*  BUN 67* 67* 45* 53* 34*  CREATININE 4.77* 5.10* 3.86* 4.63* 3.74*  ALBUMIN 2.3* 2.2*  2.2* 2.2* 2.1* 2.0*  CALCIUM 8.4* 8.5* 8.3* 8.4* 8.3*  PHOS  --  3.3  --   --   --   AST 33 47* 48* 41 46*  ALT 32 61* 64* 58* 61*   Liver Function Tests: Recent Labs  Lab 10/05/23 0946 10/06/23 0454 10/07/23 0430  AST 48* 41 46*  ALT 64* 58* 61*  ALKPHOS 101 90 81  BILITOT 1.0 0.8 0.6  PROT 5.5* 5.4* 5.2*  ALBUMIN 2.2* 2.1* 2.0*   No results for input(s): "LIPASE", "AMYLASE" in the last 168 hours. No results for input(s): "AMMONIA" in the last 168 hours. CBC: Recent Labs  Lab 10/04/23 0514 10/04/23 1515 10/05/23 0946 10/06/23 0454 10/07/23 0430  WBC 23.4* 23.1* 20.7* 16.1* 8.4  NEUTROABS  --   --   --   --  6.4  HGB 10.5* 9.4* 10.2* 9.2* 9.1*  HCT 32.5* 28.2* 31.2* 28.7* 28.8*  MCV 89.8 88.1 90.2 89.7 90.9  PLT 178 181 176 208 202   Cardiac Enzymes: No results for input(s): "CKTOTAL", "CKMB", "CKMBINDEX", "TROPONINI"  in the last 168 hours. CBG: Recent Labs  Lab 10/06/23 0735 10/06/23 1135 10/06/23 1628 10/06/23 2055 10/07/23 0805  GLUCAP 268* 206* 156* 275* 222*    Iron Studies: No results for input(s): "IRON", "TIBC", "TRANSFERRIN", "FERRITIN" in the last 72 hours. Studies/Results: IR Perc Cholecystostomy Result Date: 10/06/2023 INDICATION: 72 year old male with acute perforated calculus cholecystitis and Peri cholecystic fluid collection concerning for abscess versus biloma. He presents for percutaneous cholecystostomy tube placement and aspiration versus drainage of the secondary fluid collection as well. EXAM: CHOLECYSTOSTOMY; ABSCESS DRAINAGE MEDICATIONS: In patient currently receiving intravenous antibiotics. No additional prophylaxis administered. ANESTHESIA/SEDATION: Moderate (conscious) sedation was employed during this procedure. A total of Versed 2 mg and Fentanyl 100 mcg was administered intravenously by the Radiology nurse. Moderate Sedation Time: 20 minutes. The patient's level of consciousness and vital signs were monitored continuously by radiology nursing throughout the procedure under my direct supervision. FLUOROSCOPY TIME:  Radiation exposure index: 30 mGy reference air kerma COMPLICATIONS: None immediate. PROCEDURE: Informed written consent was obtained from the patient after a thorough discussion of the procedural risks, benefits and alternatives. All questions were addressed. Maximal Sterile  Barrier Technique was utilized including caps, mask, sterile gowns, sterile gloves, sterile drape, hand hygiene and skin antiseptic. A timeout was performed prior to the initiation of the procedure. The right upper quadrant was interrogated with ultrasound. A complex fluid collection is identified in the Peri cholecystic space. This appears enlarged compared to the most recent CT scan. The gallbladder is distended and demonstrates wall thickening. A suitable skin entry site for the percutaneous  cholecystostomy tube was selected an local anesthesia was attained by infiltration with 1% lidocaine. A small dermatotomy was made. Under real-time ultrasound guidance, a 22 gauge Accustick needle was advanced along a short transhepatic course and into the gallbladder lumen. A 0.018 wire was then coiled in the gallbladder lumen. The needle was removed. The transitional dilator was advanced over the wire and into the gallbladder. Aspiration yields black bile which was sent for Gram stain and culture. A gentle injection of contrast material confirms that the transitional dilator is within the gallbladder lumen. A 0.035 wire was coiled in the gallbladder lumen. The transitional dilator was removed. The percutaneous tract was dilated to 10 [place]. A Cook 10 [place] all-purpose drainage catheter was advanced over the wire and formed. The catheter was connected to gravity bag drainage and secured to the skin with 0 Prolene suture. Attention was next turned to the complex pericholecystic fluid collection. A second suitable skin entry site was selected and marked. Local anesthesia was again attained by infiltration with 1% lidocaine. A small dermatotomy was made. Again, under real-time ultrasound guidance, an 18 gauge trocar needle was advanced into the fluid collection. A 0.035 wire was coiled in the fluid collection. The tract was dilated to 10 [place]. A 10 French drainage catheter was advanced over the wire and formed. Aspiration yields approximately 60 mL of turbid bilious fluid. The catheter was connected to JP bulb suction. IMPRESSION: 1. Successful placement of a transhepatic percutaneous cholecystostomy tube for the treatment of acute calculus cholecystitis. 2. Placement of a 10 French drainage catheter into the pericholecystic fluid collection with evacuation of bilious fluid. Electronically Signed   By: Malachy Moan M.D.   On: 10/06/2023 18:38   IR US ABSCESS DRAIN PLACEMENT Result Date:  10/06/2023 INDICATION: 72 year old male with acute perforated calculus cholecystitis and Peri cholecystic fluid collection concerning for abscess versus biloma. He presents for percutaneous cholecystostomy tube placement and aspiration versus drainage of the secondary fluid collection as well. EXAM: CHOLECYSTOSTOMY; ABSCESS DRAINAGE MEDICATIONS: In patient currently receiving intravenous antibiotics. No additional prophylaxis administered. ANESTHESIA/SEDATION: Moderate (conscious) sedation was employed during this procedure. A total of Versed 2 mg and Fentanyl 100 mcg was administered intravenously by the Radiology nurse. Moderate Sedation Time: 20 minutes. The patient's level of consciousness and vital signs were monitored continuously by radiology nursing throughout the procedure under my direct supervision. FLUOROSCOPY TIME:  Radiation exposure index: 30 mGy reference air kerma COMPLICATIONS: None immediate. PROCEDURE: Informed written consent was obtained from the patient after a thorough discussion of the procedural risks, benefits and alternatives. All questions were addressed. Maximal Sterile Barrier Technique was utilized including caps, mask, sterile gowns, sterile gloves, sterile drape, hand hygiene and skin antiseptic. A timeout was performed prior to the initiation of the procedure. The right upper quadrant was interrogated with ultrasound. A complex fluid collection is identified in the Peri cholecystic space. This appears enlarged compared to the most recent CT scan. The gallbladder is distended and demonstrates wall thickening. A suitable skin entry site for the percutaneous cholecystostomy tube was selected an  local anesthesia was attained by infiltration with 1% lidocaine. A small dermatotomy was made. Under real-time ultrasound guidance, a 22 gauge Accustick needle was advanced along a short transhepatic course and into the gallbladder lumen. A 0.018 wire was then coiled in the gallbladder lumen.  The needle was removed. The transitional dilator was advanced over the wire and into the gallbladder. Aspiration yields black bile which was sent for Gram stain and culture. A gentle injection of contrast material confirms that the transitional dilator is within the gallbladder lumen. A 0.035 wire was coiled in the gallbladder lumen. The transitional dilator was removed. The percutaneous tract was dilated to 10 [place]. A Cook 10 [place] all-purpose drainage catheter was advanced over the wire and formed. The catheter was connected to gravity bag drainage and secured to the skin with 0 Prolene suture. Attention was next turned to the complex pericholecystic fluid collection. A second suitable skin entry site was selected and marked. Local anesthesia was again attained by infiltration with 1% lidocaine. A small dermatotomy was made. Again, under real-time ultrasound guidance, an 18 gauge trocar needle was advanced into the fluid collection. A 0.035 wire was coiled in the fluid collection. The tract was dilated to 10 [place]. A 10 French drainage catheter was advanced over the wire and formed. Aspiration yields approximately 60 mL of turbid bilious fluid. The catheter was connected to JP bulb suction. IMPRESSION: 1. Successful placement of a transhepatic percutaneous cholecystostomy tube for the treatment of acute calculus cholecystitis. 2. Placement of a 10 French drainage catheter into the pericholecystic fluid collection with evacuation of bilious fluid. Electronically Signed   By: Malachy Moan M.D.   On: 10/06/2023 18:38    calcitRIOL  0.25 mcg Oral Daily   Chlorhexidine Gluconate Cloth  6 each Topical Daily   Chlorhexidine Gluconate Cloth  6 each Topical Q0600   cycloSPORINE  1 drop Both Eyes BID   dorzolamide-timolol  1 drop Both Eyes BID   FLUoxetine  20 mg Oral QHS   folic acid  1,000 mcg Oral Daily   heparin  2,000 Units Dialysis Once in dialysis   heparin  5,000 Units Subcutaneous Q8H   insulin  aspart  0-6 Units Subcutaneous TID WC   insulin aspart  5 Units Subcutaneous TID WC   insulin glargine-yfgn  18 Units Subcutaneous Daily   latanoprost  1 drop Left Eye QHS   multivitamin  1 tablet Oral QHS   sodium chloride flush  3 mL Intravenous Q12H   sodium chloride flush  5 mL Intracatheter Q8H    BMET    Component Value Date/Time   NA 134 (L) 10/07/2023 0430   K 3.6 10/07/2023 0430   CL 96 (L) 10/07/2023 0430   CO2 27 10/07/2023 0430   GLUCOSE 252 (H) 10/07/2023 0430   BUN 34 (H) 10/07/2023 0430   CREATININE 3.74 (H) 10/07/2023 0430   CALCIUM 8.3 (L) 10/07/2023 0430   GFRNONAA 16 (L) 10/07/2023 0430   GFRAA 44 (L) 01/14/2018 1112   CBC    Component Value Date/Time   WBC 8.4 10/07/2023 0430   RBC 3.17 (L) 10/07/2023 0430   HGB 9.1 (L) 10/07/2023 0430   HCT 28.8 (L) 10/07/2023 0430   PLT 202 10/07/2023 0430   MCV 90.9 10/07/2023 0430   MCH 28.7 10/07/2023 0430   MCHC 31.6 10/07/2023 0430   RDW 12.8 10/07/2023 0430   LYMPHSABS 0.9 10/07/2023 0430   MONOABS 0.9 10/07/2023 0430   EOSABS 0.2 10/07/2023 0430  BASOSABS 0.0 10/07/2023 0430    Outpatient HD orders: [facility], MWF. 4 hours. EDW 90kg. TDC (has appt coming up with [facility] on 10/07/23 for perm access consideration). Flow rates: 350/500. 2K, 2.5Ca. Heparin: 1000 units/hr. Meds: Mircera every 4 weeks (last dose 09/15/23), Venofer 50mg  every Monday.   Assessment/Plan:   Covid-19 with superimposed bacterial pneumonia - per primary; on RA currently. Being treated for concurrent bacterial PNA with abx per primary Diarrhea - c diff neg, GI panel negative.  Appears to be improving. Cholecystitis - seen by CT scan with distended gallbladder and pericholecystic stranding and loculated fluid.  Surgery following.  IR consulted for cholecyststomy tube placement on 10/06/23.  On IV antibiotics already. ESRD - plan for HD tomorrow to continue with outpatient MWF schedule. HTN/volume - normotensive, no edema.   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  Phos stable.  Disposition - for discharge to [facility] on 10/10/23.  Irena Cords, MD [employer] [phone]

## 2023-10-07 NOTE — Progress Notes (Signed)
[facility] Progress Note     Subjective: Patient seen and examined.  He is resting comfortably in bed.  He is feeling a little bit better today.  He tolerated his breakfast without nausea and vomiting.  He feels that his abdominal pain is a little bit better.  Objective: Vital signs in last 24 hours: Temp:  [97.7 F (36.5 C)-98.4 F (36.9 C)] 97.7 F (36.5 C) (12/17 0835) Pulse Rate:  [62-80] 78 (12/17 0835) Resp:  [13-19] 19 (12/17 0835) BP: (96-138)/(59-79) 132/69 (12/17 0835) SpO2:  [99 %-100 %] 100 % (12/17 0835) Weight:  [87.3 kg] 87.3 kg (12/16 1335) Last BM Date : 10/05/23  Intake/Output from previous day: 12/16 0701 - 12/17 0700 In: 340 [P.O.:240; IV Piggyback:100] Out: 190 [Drains:190] Intake/Output this shift: Total I/O In: 240 [P.O.:240] Out: -   General appearance: alert, cooperative, and no distress GI: Abdomen soft, nondistended, no percussion tenderness, nontender to palpation; no rigidity, guarding, rebound tenderness; percutaneous cholecystostomy drain in place with 140 cc of bilious output, perihepatic drain with 50 cc of bilious output  Lab Results:  Recent Labs    10/06/23 0454 10/07/23 0430  WBC 16.1* 8.4  HGB 9.2* 9.1*  HCT 28.7* 28.8*  PLT 208 202   BMET Recent Labs    10/06/23 0454 10/07/23 0430  NA 130* 134*  K 3.5 3.6  CL 94* 96*  CO2 24 27  GLUCOSE 276* 252*  BUN 53* 34*  CREATININE 4.63* 3.74*  CALCIUM 8.4* 8.3*   PT/INR Recent Labs    10/05/23 0904  LABPROT 15.5*  INR 1.2    Studies/Results: IR Perc Cholecystostomy Result Date: 10/06/2023 INDICATION: 72 year old male with acute perforated calculus cholecystitis and Peri cholecystic fluid collection concerning for abscess versus biloma. He presents for percutaneous cholecystostomy tube placement and aspiration versus drainage of the secondary fluid collection as well. EXAM: CHOLECYSTOSTOMY; ABSCESS DRAINAGE MEDICATIONS: In patient currently receiving  intravenous antibiotics. No additional prophylaxis administered. ANESTHESIA/SEDATION: Moderate (conscious) sedation was employed during this procedure. A total of Versed 2 mg and Fentanyl 100 mcg was administered intravenously by the Radiology nurse. Moderate Sedation Time: 20 minutes. The patient's level of consciousness and vital signs were monitored continuously by radiology nursing throughout the procedure under my direct supervision. FLUOROSCOPY TIME:  Radiation exposure index: 30 mGy reference air kerma COMPLICATIONS: None immediate. PROCEDURE: Informed written consent was obtained from the patient after a thorough discussion of the procedural risks, benefits and alternatives. All questions were addressed. Maximal Sterile Barrier Technique was utilized including caps, mask, sterile gowns, sterile gloves, sterile drape, hand hygiene and skin antiseptic. A timeout was performed prior to the initiation of the procedure. The right upper quadrant was interrogated with ultrasound. A complex fluid collection is identified in the Peri cholecystic space. This appears enlarged compared to the most recent CT scan. The gallbladder is distended and demonstrates wall thickening. A suitable skin entry site for the percutaneous cholecystostomy tube was selected an local anesthesia was attained by infiltration with 1% lidocaine. A small dermatotomy was made. Under real-time ultrasound guidance, a 22 gauge Accustick needle was advanced along a short transhepatic course and into the gallbladder lumen. A 0.018 wire was then coiled in the gallbladder lumen. The needle was removed. The transitional dilator was advanced over the wire and into the gallbladder. Aspiration yields black bile which was sent for Gram stain and culture. A gentle injection of contrast material confirms that the transitional dilator is within the gallbladder lumen. A 0.035 wire was  coiled in the gallbladder lumen. The transitional dilator was removed. The  percutaneous tract was dilated to 10 [place]. A Cook 10 [place] all-purpose drainage catheter was advanced over the wire and formed. The catheter was connected to gravity bag drainage and secured to the skin with 0 Prolene suture. Attention was next turned to the complex pericholecystic fluid collection. A second suitable skin entry site was selected and marked. Local anesthesia was again attained by infiltration with 1% lidocaine. A small dermatotomy was made. Again, under real-time ultrasound guidance, an 18 gauge trocar needle was advanced into the fluid collection. A 0.035 wire was coiled in the fluid collection. The tract was dilated to 10 [place]. A 10 French drainage catheter was advanced over the wire and formed. Aspiration yields approximately 60 mL of turbid bilious fluid. The catheter was connected to JP bulb suction. IMPRESSION: 1. Successful placement of a transhepatic percutaneous cholecystostomy tube for the treatment of acute calculus cholecystitis. 2. Placement of a 10 French drainage catheter into the pericholecystic fluid collection with evacuation of bilious fluid. Electronically Signed   By: Malachy Moan M.D.   On: 10/06/2023 18:38   IR US ABSCESS DRAIN PLACEMENT Result Date: 10/06/2023 INDICATION: 72 year old male with acute perforated calculus cholecystitis and Peri cholecystic fluid collection concerning for abscess versus biloma. He presents for percutaneous cholecystostomy tube placement and aspiration versus drainage of the secondary fluid collection as well. EXAM: CHOLECYSTOSTOMY; ABSCESS DRAINAGE MEDICATIONS: In patient currently receiving intravenous antibiotics. No additional prophylaxis administered. ANESTHESIA/SEDATION: Moderate (conscious) sedation was employed during this procedure. A total of Versed 2 mg and Fentanyl 100 mcg was administered intravenously by the Radiology nurse. Moderate Sedation Time: 20 minutes. The patient's level of consciousness and vital signs were  monitored continuously by radiology nursing throughout the procedure under my direct supervision. FLUOROSCOPY TIME:  Radiation exposure index: 30 mGy reference air kerma COMPLICATIONS: None immediate. PROCEDURE: Informed written consent was obtained from the patient after a thorough discussion of the procedural risks, benefits and alternatives. All questions were addressed. Maximal Sterile Barrier Technique was utilized including caps, mask, sterile gowns, sterile gloves, sterile drape, hand hygiene and skin antiseptic. A timeout was performed prior to the initiation of the procedure. The right upper quadrant was interrogated with ultrasound. A complex fluid collection is identified in the Peri cholecystic space. This appears enlarged compared to the most recent CT scan. The gallbladder is distended and demonstrates wall thickening. A suitable skin entry site for the percutaneous cholecystostomy tube was selected an local anesthesia was attained by infiltration with 1% lidocaine. A small dermatotomy was made. Under real-time ultrasound guidance, a 22 gauge Accustick needle was advanced along a short transhepatic course and into the gallbladder lumen. A 0.018 wire was then coiled in the gallbladder lumen. The needle was removed. The transitional dilator was advanced over the wire and into the gallbladder. Aspiration yields black bile which was sent for Gram stain and culture. A gentle injection of contrast material confirms that the transitional dilator is within the gallbladder lumen. A 0.035 wire was coiled in the gallbladder lumen. The transitional dilator was removed. The percutaneous tract was dilated to 10 [place]. A Cook 10 [place] all-purpose drainage catheter was advanced over the wire and formed. The catheter was connected to gravity bag drainage and secured to the skin with 0 Prolene suture. Attention was next turned to the complex pericholecystic fluid collection. A second suitable skin entry site was  selected and marked. Local anesthesia was again attained by infiltration with 1%  lidocaine. A small dermatotomy was made. Again, under real-time ultrasound guidance, an 18 gauge trocar needle was advanced into the fluid collection. A 0.035 wire was coiled in the fluid collection. The tract was dilated to 10 [place]. A 10 French drainage catheter was advanced over the wire and formed. Aspiration yields approximately 60 mL of turbid bilious fluid. The catheter was connected to JP bulb suction. IMPRESSION: 1. Successful placement of a transhepatic percutaneous cholecystostomy tube for the treatment of acute calculus cholecystitis. 2. Placement of a 10 French drainage catheter into the pericholecystic fluid collection with evacuation of bilious fluid. Electronically Signed   By: Malachy Moan M.D.   On: 10/06/2023 18:38    Anti-infectives: Anti-infectives (From admission, onward)    Start     Dose/Rate Route Frequency Ordered Stop   10/06/23 1600  piperacillin-tazobactam (ZOSYN) 2.25 g in sodium chloride 0.9 % 50 mL IVPB        2.25 g 100 mL/hr over 30 Minutes Intravenous Every 8 hours 10/06/23 0914     10/04/23 1600  piperacillin-tazobactam (ZOSYN) IVPB 2.25 g  Status:  Discontinued        2.25 g 100 mL/hr over 30 Minutes Intravenous Every 8 hours 10/04/23 1502 10/06/23 0914   10/03/23 1400  ceFEPIme (MAXIPIME) 2 g in sodium chloride 0.9 % 100 mL IVPB        2 g 200 mL/hr over 30 Minutes Intravenous Every M-W-F (Hemodialysis) 10/03/23 1033 10/03/23 1437   10/01/23 2000  ceFEPIme (MAXIPIME) 1 g in sodium chloride 0.9 % 100 mL IVPB  Status:  Discontinued        1 g 200 mL/hr over 30 Minutes Intravenous Every 24 hours 10/01/23 1830 10/03/23 1033   09/29/23 2015  azithromycin (ZITHROMAX) tablet 500 mg  Status:  Discontinued        500 mg Oral Daily 09/29/23 1929 10/04/23 1745   09/29/23 2000  cefTRIAXone (ROCEPHIN) 1 g in sodium chloride 0.9 % 100 mL IVPB  Status:  Discontinued        1 g 200  mL/hr over 30 Minutes Intravenous Every 24 hours 09/29/23 1929 10/01/23 1830   09/29/23 1345  cefTRIAXone (ROCEPHIN) 1 g in sodium chloride 0.9 % 100 mL IVPB  Status:  Discontinued        1 g 200 mL/hr over 30 Minutes Intravenous  Once 09/29/23 1333 09/29/23 1354   09/29/23 1345  azithromycin (ZITHROMAX) 500 mg in sodium chloride 0.9 % 250 mL IVPB  Status:  Discontinued        500 mg 250 mL/hr over 60 Minutes Intravenous  Once 09/29/23 1333 09/29/23 1354       Assessment/Plan:  Patient is a 72 year old male who was admitted with COVID-pneumonia and subsequently developed concern for acute cholecystitis on imaging.  -Status post percutaneous cholecystostomy tube and pericholecystic drain placement on 12/16 -Leukocytosis has resolved today, WBC 8.4.  LFTs are also within normal limits -Would recommend that the patient receive 5 days of antibiotic treatment for cholecystitis -Low-fat diet from surgical standpoint -PRN pain control and antiemetics- -Patient will follow-up with IR for evaluation of drains -May follow-up with me after following up with INR discussed need for cholecystectomy -Stable for discharge from general surgery standpoint -Care per primary team   LOS: 7 days    Jensyn Cambria A Amil Moseman 10/07/2023

## 2023-10-07 NOTE — Plan of Care (Signed)
  Problem: Education: Goal: Knowledge of risk factors and measures for prevention of condition will improve Outcome: Progressing   Problem: Coping: Goal: Psychosocial and spiritual needs will be supported Outcome: Progressing   Problem: Respiratory: Goal: Complications related to the disease process, condition or treatment will be avoided or minimized Outcome: Progressing   Problem: Education: Goal: Knowledge of General Education information will improve Description: Including pain rating scale, medication(s)/side effects and non-pharmacologic comfort measures Outcome: Progressing   Problem: Health Behavior/Discharge Planning: Goal: Ability to manage health-related needs will improve Outcome: Progressing

## 2023-10-07 NOTE — Plan of Care (Signed)
  Problem: Coping: Goal: Psychosocial and spiritual needs will be supported Outcome: Progressing   Problem: Nutrition: Goal: Adequate nutrition will be maintained Outcome: Progressing   Problem: Coping: Goal: Psychosocial and spiritual needs will be supported Outcome: Progressing   Problem: Nutrition: Goal: Adequate nutrition will be maintained Outcome: Progressing

## 2023-10-07 NOTE — Progress Notes (Signed)
PROGRESS NOTE   PACER WEHR  ION:[id number] DOB: 1951-04-21 DOA: 09/29/2023 PCP: Kirstie Peri, MD   Chief Complaint  Patient presents with   Weakness   Level of care: Med-Surg  Brief Admission History:  72 year old male with a history of hypertension, diabetes mellitus, hyperlipidemia, newly diagnosed ESRD, and anemia of CKD  The patient was recently hospitalized from 08/14/2023-08/20/2023 where he was declared new ESRD and initiated on dialysis. TDC was placed on 08/15/2023, and the patient was initiated on HD.    Pt is admitted secondary to general malaise, nausea and weakness; patient has also expressed intermittent nonproductive coughing spells and mild shortness of breath.  Patient denies chills, fever, overt bleeding, focal weaknesses, headache or any other complaints.   Of note, patient has been released from the skilled nursing facility about 1 week prior to this hospitalization.   Workup in the ED demonstrated positive COVID-19 infection, mild dehydration and hypokalemia.   TRH has been consulted to place in the hospital for further evaluation and management.  The patient also had a recent hospital admission from 08/21/2023 to 08/25/2023 when he was treated for sepsis secondary to Pseudomonas UTI and lobar pneumonia.  He was treated with IV cefepime, and discharged with Cipro for 2 additional doses.  The patient was placed on intravenous steroids for his COVID 19 infection.  His PCT was 2.05.  As result, the patient was initially started on ceftriaxone and azithromycin for presumptive superimposed pneumonia.  He gradually improved, but WBC increased.  UA suggested significant pyuria with WBC>50.  His ceftriaxone was switched to cefepime given his previous Pseudomonas in the urine.  The patient remained afebrile and hemodynamically stable.  Unfortunately, he began developing right upper quadrant abdominal pain with increase in WBC despite being on antibiotics.  CT of the  abdomen and pelvis was performed and showed distended gallbladder with pericholecystic stranding and loculated pericholecystic fluid.  There was concern for pericholecystic abscess.  General surgery was consulted.  IR was also subsequently consulted for percutaneous cholecystotomy tube and drainage of the abscess.   Assessment and Plan:  COVID 19 Pneumonia - treated  -stable on RA -presented with intermittent nonproductive coughing spells and just mild short winded sensation with activity. -treated with supportive care, bronchodilators and steroids -CRP 32.9>>9.6>>6.1 -Patient was started on steroids the time of admission>>last dose completed 10/02/23   Presumed bacterial pneumonia -Patient with elevated leukocytosis and also elevated procalcitonin -PCT 2.05 -initially ceftriaxone and azithro -Continue mucolytic's, flutter valve and bronchodilator as mentioned above -switched over to zosyn by surgery for anaerobic coverage for gallbladder issue   Cholecystitis with concern for abscess -10/03/2023 CT abdomen--distended gallbladder with pericholecystic stranding and loculated pericholecystic fluid in the right subhepatic space -Appreciate General Surgery, case discussed with Dr. Robyne Peers -Patient remains afebrile hemodynamically stable with minimal right upper quadrant pain -IR for cholecystotomy tube +/-drainage of abscess on 12/16 -complete Zosyn on Friday when discharging to SNF so no need to discharge on further antibiotics per surgery    ESRD -Appreciate nephrology managing inpatient HD -regular outpatient treatments on MWF   Pyuria/UTI -sent urine culture 12/11--multiple organisms -remains on IV zosyn    Diabetes mellitus type 2 -08/14/2023 hemoglobin A1c 5.6 -Continue NovoLog sliding scale -increased Semglee 18 units; added novolog 6 units TID with meals -sensitive sliding scale; CBG 5x per day  CBG (last 3)  Recent Labs    10/06/23 2055 10/07/23 0805 10/07/23 1116   GLUCAP 275* 222* 313*   hypokalemia -Repleted -Continue to follow  electrolytes -correction on HD   hypertension/hyperlipidemia -Continue statins and metoprolol -12/14--now hold statin with mild LFT elevation   BPH -Continue Flomax   increased intraocular pressure/glaucoma -Continue the use of latanoprost and also dorzolamide/timolol. -Continue outpatient follow-up with ophthalmology service.   depression/anxiety -Continue as needed Xanax and the use of Prozac.   generalized weakness -Most likely in the setting of pneumonia/COVID -PT evaluation>>SNF   anemia of chronic kidney disease -Stable hemoglobin - Hgb stable in 9 range, no bleeding. On ESA per nephrology    Diarrhea -c diff neg -check stool pathogen panel--pending -prn loperamide -due to COVID-19 -improved  DVT prophylaxis: sq heparin  Code Status: Full  Family Communication:  Disposition:    Consultants:  Nephrology Surgery IR  Procedures:  Hemodialysis 12/16 IR cholecystostomy drain 12/16  Antimicrobials:  Zosyn>>12/16  Subjective: Having tenderness at site of drain placement, drain putting out bilious appearing fluid.   Objective: Vitals:   10/06/23 1335 10/06/23 1626 10/06/23 2051 10/07/23 0835  BP: 138/77 117/73 (!) 109/59 132/69  Pulse: 64 67 70 78  Resp: 18 16 16 19   Temp: 97.8 F (36.6 C) 98.4 F (36.9 C)  97.7 F (36.5 C)  TempSrc: Axillary Oral  Oral  SpO2: 100% 100% 100% 100%  Weight: 87.3 kg     Height:        Intake/Output Summary (Last 24 hours) at 10/07/2023 1555 Last data filed at 10/07/2023 1249 Gross per 24 hour  Intake 820 ml  Output 190 ml  Net 630 ml   Filed Weights   10/04/23 1837 10/06/23 1035 10/06/23 1335  Weight: 86.2 kg 87.5 kg 87.3 kg   Examination:  General exam: chronically ill appearing male; Appears calm and comfortable.    Respiratory system: no rales heard. Respiratory effort normal. Cardiovascular system: normal S1 & S2 heard. No JVD,  murmurs, rubs, gallops or clicks. No pedal edema. Gastrointestinal system: Abdomen is nondistended, soft and nontender. No organomegaly or masses felt. Normal bowel sounds heard. Central nervous system: Alert and oriented to person, place. No focal neurological deficits. Extremities: Symmetric 5 x 5 power. Skin: No rashes, lesions or ulcers. Psychiatry: Judgement and insight appear diminished. Mood & affect flat.   Data Reviewed: I have personally reviewed following labs and imaging studies  CBC: Recent Labs  Lab 10/04/23 0514 10/04/23 1515 10/05/23 0946 10/06/23 0454 10/07/23 0430  WBC 23.4* 23.1* 20.7* 16.1* 8.4  NEUTROABS  --   --   --   --  6.4  HGB 10.5* 9.4* 10.2* 9.2* 9.1*  HCT 32.5* 28.2* 31.2* 28.7* 28.8*  MCV 89.8 88.1 90.2 89.7 90.9  PLT 178 181 176 208 202    Basic Metabolic Panel: Recent Labs  Lab 10/02/23 0447 10/04/23 1515 10/05/23 0946 10/06/23 0454 10/07/23 0430  NA 130* 132* 130* 130* 134*  K 3.5 3.0* 3.8 3.5 3.6  CL 94* 97* 94* 94* 96*  CO2 23 24 25 24 27   GLUCOSE 321* 85 282* 276* 252*  BUN 67* 67* 45* 53* 34*  CREATININE 4.77* 5.10* 3.86* 4.63* 3.74*  CALCIUM 8.4* 8.5* 8.3* 8.4* 8.3*  MG  --   --   --   --  1.9  PHOS  --  3.3  --   --   --     CBG: Recent Labs  Lab 10/06/23 1135 10/06/23 1628 10/06/23 2055 10/07/23 0805 10/07/23 1116  GLUCAP 206* 156* 275* 222* 313*    Recent Results (from the past 240 hours)  Resp panel by  RT-PCR (RSV, Flu A&B, Covid) Anterior Nasal Swab     Status: Abnormal   Collection Time: 09/29/23 10:05 AM   Specimen: Anterior Nasal Swab  Result Value Ref Range Status   SARS Coronavirus 2 by RT PCR POSITIVE (A) NEGATIVE Final    Comment: (NOTE) SARS-CoV-2 target nucleic acids are DETECTED.  The SARS-CoV-2 RNA is generally detectable in upper respiratory specimens during the acute phase of infection. Positive results are indicative of the presence of the identified virus, but do not rule out bacterial  infection or co-infection with other pathogens not detected by the test. Clinical correlation with patient history and other diagnostic information is necessary to determine patient infection status. The expected result is Negative.  Fact Sheet for Patients: BloggerCourse.com  Fact Sheet for Healthcare Providers: SeriousBroker.it  This test is not yet approved or cleared by the [place] FDA and  has been authorized for detection and/or diagnosis of SARS-CoV-2 by FDA under an Emergency Use Authorization (EUA).  This EUA will remain in effect (meaning this test can be used) for the duration of  the COVID-19 declaration under Section 564(b)(1) of the A ct, 21 U.S.C. section 360bbb-3(b)(1), unless the authorization is terminated or revoked sooner.     Influenza A by PCR NEGATIVE NEGATIVE Final   Influenza B by PCR NEGATIVE NEGATIVE Final    Comment: (NOTE) The Xpert Xpress SARS-CoV-2/FLU/RSV plus assay is intended as an aid in the diagnosis of influenza from Nasopharyngeal swab specimens and should not be used as a sole basis for treatment. Nasal washings and aspirates are unacceptable for Xpert Xpress SARS-CoV-2/FLU/RSV testing.  Fact Sheet for Patients: BloggerCourse.com  Fact Sheet for Healthcare Providers: SeriousBroker.it  This test is not yet approved or cleared by the [place] FDA and has been authorized for detection and/or diagnosis of SARS-CoV-2 by FDA under an Emergency Use Authorization (EUA). This EUA will remain in effect (meaning this test can be used) for the duration of the COVID-19 declaration under Section 564(b)(1) of the Act, 21 U.S.C. section 360bbb-3(b)(1), unless the authorization is terminated or revoked.     Resp Syncytial Virus by PCR NEGATIVE NEGATIVE Final    Comment: (NOTE) Fact Sheet for  Patients: BloggerCourse.com  Fact Sheet for Healthcare Providers: SeriousBroker.it  This test is not yet approved or cleared by the [place] FDA and has been authorized for detection and/or diagnosis of SARS-CoV-2 by FDA under an Emergency Use Authorization (EUA). This EUA will remain in effect (meaning this test can be used) for the duration of the COVID-19 declaration under Section 564(b)(1) of the Act, 21 U.S.C. section 360bbb-3(b)(1), unless the authorization is terminated or revoked.  Performed at [facility], [address] [place], [place] [address]   Urine Culture (for pregnant, neutropenic or urologic patients or patients with an indwelling urinary catheter)     Status: Abnormal   Collection Time: 09/29/23  3:24 PM   Specimen: Urine, Clean Catch  Result Value Ref Range Status   Specimen Description   Final    URINE, CLEAN CATCH Performed at [facility], [address], [place], [place] [address]    Special Requests   Final    NONE Performed at [facility], [address] [place], [place] [address]    Culture MULTIPLE SPECIES PRESENT, SUGGEST RECOLLECTION (A)  Final   Report Status 10/02/2023 FINAL  Final  C Difficile Quick Screen w PCR reflex     Status: None   Collection Time: 10/01/23 11:15 AM  Specimen: STOOL  Result Value Ref Range Status   C Diff antigen NEGATIVE NEGATIVE Final   C Diff toxin NEGATIVE NEGATIVE Final   C Diff interpretation No C. difficile detected.  Final    Comment: Performed at [facility], [address] [place], [place] [address]  Gastrointestinal Panel by PCR , Stool     Status: None   Collection Time: 10/02/23 10:15 AM   Specimen: Stool  Result Value Ref Range Status   Campylobacter species NOT DETECTED NOT DETECTED Final   Plesimonas shigelloides NOT DETECTED NOT DETECTED Final   Salmonella species NOT DETECTED NOT DETECTED Final   Yersinia enterocolitica NOT DETECTED NOT  DETECTED Final   Vibrio species NOT DETECTED NOT DETECTED Final   Vibrio cholerae NOT DETECTED NOT DETECTED Final   Enteroaggregative E coli (EAEC) NOT DETECTED NOT DETECTED Final   Enteropathogenic E coli (EPEC) NOT DETECTED NOT DETECTED Final   Enterotoxigenic E coli (ETEC) NOT DETECTED NOT DETECTED Final   Shiga like toxin producing E coli (STEC) NOT DETECTED NOT DETECTED Final   Shigella/Enteroinvasive E coli (EIEC) NOT DETECTED NOT DETECTED Final   Cryptosporidium NOT DETECTED NOT DETECTED Final   Cyclospora cayetanensis NOT DETECTED NOT DETECTED Final   Entamoeba histolytica NOT DETECTED NOT DETECTED Final   Giardia lamblia NOT DETECTED NOT DETECTED Final   Adenovirus F40/41 NOT DETECTED NOT DETECTED Final   Astrovirus NOT DETECTED NOT DETECTED Final   Norovirus GI/GII NOT DETECTED NOT DETECTED Final   Rotavirus A NOT DETECTED NOT DETECTED Final   Sapovirus (I, II, IV, and V) NOT DETECTED NOT DETECTED Final    Comment: Performed at [facility], [address] [place], [place] [address]  Culture, blood (Routine X 2) w Reflex to ID Panel     Status: None (Preliminary result)   Collection Time: 10/03/23  2:37 PM   Specimen: BLOOD  Result Value Ref Range Status   Specimen Description BLOOD RIGHT ANTECUBITAL  Final   Special Requests   Final    Blood Culture results may not be optimal due to an inadequate volume of blood received in culture bottles BOTTLES DRAWN AEROBIC ONLY   Culture   Final    NO GROWTH 4 DAYS Performed at [facility], [address], [place], [place] [address]    Report Status PENDING  Incomplete  Culture, blood (Routine X 2) w Reflex to ID Panel     Status: None (Preliminary result)   Collection Time: 10/03/23  2:45 PM   Specimen: BLOOD  Result Value Ref Range Status   Specimen Description BLOOD BLOOD LEFT HAND  Final   Special Requests   Final    Blood Culture results may not be optimal due to an inadequate volume of blood received in culture  bottles BOTTLES DRAWN AEROBIC ONLY   Culture   Final    NO GROWTH 4 DAYS Performed at [facility], [address] [place], [place] [address]    Report Status PENDING  Incomplete  Aerobic/Anaerobic Culture w Gram Stain (surgical/deep wound)     Status: None (Preliminary result)   Collection Time: 10/06/23  3:32 PM   Specimen: BILE  Result Value Ref Range Status   Specimen Description   Final    BILE Performed at [facility], [address], [place], [place] [address]    Special Requests   Final    Normal Performed at [facility], [address] [place], [place] [address]    Gram Stain NO WBC  SEEN NO ORGANISMS SEEN   Final   Culture   Final    NO GROWTH < 24 HOURS Performed at [facility], [address], [place], [place] [address]    Report Status PENDING  Incomplete     Radiology Studies: IR Perc Cholecystostomy Result Date: 10/06/2023 INDICATION: 72 year old male with acute perforated calculus cholecystitis and Peri cholecystic fluid collection concerning for abscess versus biloma. He presents for percutaneous cholecystostomy tube placement and aspiration versus drainage of the secondary fluid collection as well. EXAM: CHOLECYSTOSTOMY; ABSCESS DRAINAGE MEDICATIONS: In patient currently receiving intravenous antibiotics. No additional prophylaxis administered. ANESTHESIA/SEDATION: Moderate (conscious) sedation was employed during this procedure. A total of Versed 2 mg and Fentanyl 100 mcg was administered intravenously by the Radiology nurse. Moderate Sedation Time: 20 minutes. The patient's level of consciousness and vital signs were monitored continuously by radiology nursing throughout the procedure under my direct supervision. FLUOROSCOPY TIME:  Radiation exposure index: 30 mGy reference air kerma COMPLICATIONS: None immediate. PROCEDURE: Informed written consent was obtained from the patient after a thorough discussion of the procedural risks, benefits and alternatives.  All questions were addressed. Maximal Sterile Barrier Technique was utilized including caps, mask, sterile gowns, sterile gloves, sterile drape, hand hygiene and skin antiseptic. A timeout was performed prior to the initiation of the procedure. The right upper quadrant was interrogated with ultrasound. A complex fluid collection is identified in the Peri cholecystic space. This appears enlarged compared to the most recent CT scan. The gallbladder is distended and demonstrates wall thickening. A suitable skin entry site for the percutaneous cholecystostomy tube was selected an local anesthesia was attained by infiltration with 1% lidocaine. A small dermatotomy was made. Under real-time ultrasound guidance, a 22 gauge Accustick needle was advanced along a short transhepatic course and into the gallbladder lumen. A 0.018 wire was then coiled in the gallbladder lumen. The needle was removed. The transitional dilator was advanced over the wire and into the gallbladder. Aspiration yields black bile which was sent for Gram stain and culture. A gentle injection of contrast material confirms that the transitional dilator is within the gallbladder lumen. A 0.035 wire was coiled in the gallbladder lumen. The transitional dilator was removed. The percutaneous tract was dilated to 10 [place]. A Cook 10 [place] all-purpose drainage catheter was advanced over the wire and formed. The catheter was connected to gravity bag drainage and secured to the skin with 0 Prolene suture. Attention was next turned to the complex pericholecystic fluid collection. A second suitable skin entry site was selected and marked. Local anesthesia was again attained by infiltration with 1% lidocaine. A small dermatotomy was made. Again, under real-time ultrasound guidance, an 18 gauge trocar needle was advanced into the fluid collection. A 0.035 wire was coiled in the fluid collection. The tract was dilated to 10 [place]. A 10 French drainage catheter was  advanced over the wire and formed. Aspiration yields approximately 60 mL of turbid bilious fluid. The catheter was connected to JP bulb suction. IMPRESSION: 1. Successful placement of a transhepatic percutaneous cholecystostomy tube for the treatment of acute calculus cholecystitis. 2. Placement of a 10 French drainage catheter into the pericholecystic fluid collection with evacuation of bilious fluid. Electronically Signed   By: Malachy Moan M.D.   On: 10/06/2023 18:38   IR US ABSCESS DRAIN PLACEMENT Result Date: 10/06/2023 INDICATION: 72 year old male with acute perforated calculus cholecystitis and Peri cholecystic fluid collection concerning for abscess versus biloma. He presents for percutaneous cholecystostomy tube placement  and aspiration versus drainage of the secondary fluid collection as well. EXAM: CHOLECYSTOSTOMY; ABSCESS DRAINAGE MEDICATIONS: In patient currently receiving intravenous antibiotics. No additional prophylaxis administered. ANESTHESIA/SEDATION: Moderate (conscious) sedation was employed during this procedure. A total of Versed 2 mg and Fentanyl 100 mcg was administered intravenously by the Radiology nurse. Moderate Sedation Time: 20 minutes. The patient's level of consciousness and vital signs were monitored continuously by radiology nursing throughout the procedure under my direct supervision. FLUOROSCOPY TIME:  Radiation exposure index: 30 mGy reference air kerma COMPLICATIONS: None immediate. PROCEDURE: Informed written consent was obtained from the patient after a thorough discussion of the procedural risks, benefits and alternatives. All questions were addressed. Maximal Sterile Barrier Technique was utilized including caps, mask, sterile gowns, sterile gloves, sterile drape, hand hygiene and skin antiseptic. A timeout was performed prior to the initiation of the procedure. The right upper quadrant was interrogated with ultrasound. A complex fluid collection is identified in  the Peri cholecystic space. This appears enlarged compared to the most recent CT scan. The gallbladder is distended and demonstrates wall thickening. A suitable skin entry site for the percutaneous cholecystostomy tube was selected an local anesthesia was attained by infiltration with 1% lidocaine. A small dermatotomy was made. Under real-time ultrasound guidance, a 22 gauge Accustick needle was advanced along a short transhepatic course and into the gallbladder lumen. A 0.018 wire was then coiled in the gallbladder lumen. The needle was removed. The transitional dilator was advanced over the wire and into the gallbladder. Aspiration yields black bile which was sent for Gram stain and culture. A gentle injection of contrast material confirms that the transitional dilator is within the gallbladder lumen. A 0.035 wire was coiled in the gallbladder lumen. The transitional dilator was removed. The percutaneous tract was dilated to 10 [place]. A Cook 10 [place] all-purpose drainage catheter was advanced over the wire and formed. The catheter was connected to gravity bag drainage and secured to the skin with 0 Prolene suture. Attention was next turned to the complex pericholecystic fluid collection. A second suitable skin entry site was selected and marked. Local anesthesia was again attained by infiltration with 1% lidocaine. A small dermatotomy was made. Again, under real-time ultrasound guidance, an 18 gauge trocar needle was advanced into the fluid collection. A 0.035 wire was coiled in the fluid collection. The tract was dilated to 10 [place]. A 10 French drainage catheter was advanced over the wire and formed. Aspiration yields approximately 60 mL of turbid bilious fluid. The catheter was connected to JP bulb suction. IMPRESSION: 1. Successful placement of a transhepatic percutaneous cholecystostomy tube for the treatment of acute calculus cholecystitis. 2. Placement of a 10 French drainage catheter into the  pericholecystic fluid collection with evacuation of bilious fluid. Electronically Signed   By: Malachy Moan M.D.   On: 10/06/2023 18:38    Scheduled Meds:  calcitRIOL  0.25 mcg Oral Daily   Chlorhexidine Gluconate Cloth  6 each Topical Daily   Chlorhexidine Gluconate Cloth  6 each Topical Q0600   cycloSPORINE  1 drop Both Eyes BID   [START ON 10/08/2023] darbepoetin (ARANESP) injection - DIALYSIS  60 mcg Subcutaneous Q Wed-1800   dorzolamide-timolol  1 drop Both Eyes BID   FLUoxetine  20 mg Oral QHS   folic acid  1,000 mcg Oral Daily   heparin  2,000 Units Dialysis Once in dialysis   heparin  5,000 Units Subcutaneous Q8H   insulin aspart  0-6 Units Subcutaneous TID WC   insulin  aspart  6 Units Subcutaneous TID WC   insulin glargine-yfgn  18 Units Subcutaneous Daily   latanoprost  1 drop Left Eye QHS   multivitamin  1 tablet Oral QHS   sodium chloride flush  3 mL Intravenous Q12H   sodium chloride flush  5 mL Intracatheter Q8H   Continuous Infusions:  piperacillin-tazobactam (ZOSYN)  IV      LOS: 7 days   Time spent: 47 mins  Saki Legore Laural Benes, MD How to contact the [facility] Attending or Consulting provider 7A - 7P or covering provider during after hours 7P -7A, for this patient?  Check the care team in [facility] and look for a) attending/consulting TRH provider listed and b) the [facility] team listed Log into [email or web address] to find provider on call.  Locate the [facility] provider you are looking for under [facility] and page to a number that you can be directly reached. If you still have difficulty reaching the provider, please page the [facility] (Director on Call) for the Hospitalists listed on amion for assistance.  10/07/2023, 3:55 PM

## 2023-10-07 NOTE — Progress Notes (Signed)
Interventional Radiology Brief Note:  Patient s/p perihepatic drain placement as well as percutaneous cholecystostomy drain placement in IR yesterday afternoon.   Per RN, patient doing well.  No complaints related to drain.  Significant output overnight.   Drain care orders released and active.   Follow-up order placed.  Scheduler will reach out to arrange after discharge.   IR available as needed.   Loyce Dys, MS RD PA-C

## 2023-10-08 DIAGNOSIS — U071 COVID-19: Secondary | ICD-10-CM | POA: Diagnosis not present

## 2023-10-08 DIAGNOSIS — K810 Acute cholecystitis: Secondary | ICD-10-CM | POA: Diagnosis not present

## 2023-10-08 LAB — RENAL FUNCTION PANEL
Albumin: 2.2 g/dL — ABNORMAL LOW (ref 3.5–5.0)
Anion gap: 11 (ref 5–15)
BUN: 37 mg/dL — ABNORMAL HIGH (ref 8–23)
CO2: 25 mmol/L (ref 22–32)
Calcium: 8.5 mg/dL — ABNORMAL LOW (ref 8.9–10.3)
Chloride: 100 mmol/L (ref 98–111)
Creatinine, Ser: 4.52 mg/dL — ABNORMAL HIGH (ref 0.61–1.24)
GFR, Estimated: 13 mL/min — ABNORMAL LOW (ref >60)
Glucose, Bld: 115 mg/dL — ABNORMAL HIGH (ref 70–99)
Phosphorus: 3.9 mg/dL (ref 2.5–4.6)
Potassium: 3.5 mmol/L (ref 3.5–5.1)
Sodium: 136 mmol/L (ref 135–145)

## 2023-10-08 LAB — CBC WITH DIFFERENTIAL/PLATELET
Abs Immature Granulocytes: 0.07 10*3/uL (ref 0.00–0.07)
Basophils Absolute: 0 10*3/uL (ref 0.0–0.1)
Basophils Relative: 0 %
Eosinophils Absolute: 0.1 10*3/uL (ref 0.0–0.5)
Eosinophils Relative: 2 %
HCT: 28.7 % — ABNORMAL LOW (ref 39.0–52.0)
Hemoglobin: 9.3 g/dL — ABNORMAL LOW (ref 13.0–17.0)
Immature Granulocytes: 1 %
Lymphocytes Relative: 13 %
Lymphs Abs: 1.1 10*3/uL (ref 0.7–4.0)
MCH: 29.6 pg (ref 26.0–34.0)
MCHC: 32.4 g/dL (ref 30.0–36.0)
MCV: 91.4 fL (ref 80.0–100.0)
Monocytes Absolute: 1.1 10*3/uL — ABNORMAL HIGH (ref 0.1–1.0)
Monocytes Relative: 13 %
Neutro Abs: 6 10*3/uL (ref 1.7–7.7)
Neutrophils Relative %: 71 %
Platelets: 222 10*3/uL (ref 150–400)
RBC: 3.14 MIL/uL — ABNORMAL LOW (ref 4.22–5.81)
RDW: 13.2 % (ref 11.5–15.5)
WBC: 8.3 10*3/uL (ref 4.0–10.5)
nRBC: 0 % (ref 0.0–0.2)

## 2023-10-08 LAB — COMPREHENSIVE METABOLIC PANEL
ALT: 67 U/L — ABNORMAL HIGH (ref 0–44)
AST: 57 U/L — ABNORMAL HIGH (ref 15–41)
Albumin: 2.1 g/dL — ABNORMAL LOW (ref 3.5–5.0)
Alkaline Phosphatase: 76 U/L (ref 38–126)
Anion gap: 11 (ref 5–15)
BUN: 37 mg/dL — ABNORMAL HIGH (ref 8–23)
CO2: 26 mmol/L (ref 22–32)
Calcium: 8.6 mg/dL — ABNORMAL LOW (ref 8.9–10.3)
Chloride: 100 mmol/L (ref 98–111)
Creatinine, Ser: 4.37 mg/dL — ABNORMAL HIGH (ref 0.61–1.24)
GFR, Estimated: 14 mL/min — ABNORMAL LOW (ref >60)
Glucose, Bld: 121 mg/dL — ABNORMAL HIGH (ref 70–99)
Potassium: 3.4 mmol/L — ABNORMAL LOW (ref 3.5–5.1)
Sodium: 137 mmol/L (ref 135–145)
Total Bilirubin: 0.4 mg/dL (ref <1.2)
Total Protein: 5.5 g/dL — ABNORMAL LOW (ref 6.5–8.1)

## 2023-10-08 LAB — CBC
HCT: 28.4 % — ABNORMAL LOW (ref 39.0–52.0)
Hemoglobin: 9 g/dL — ABNORMAL LOW (ref 13.0–17.0)
MCH: 29.1 pg (ref 26.0–34.0)
MCHC: 31.7 g/dL (ref 30.0–36.0)
MCV: 91.9 fL (ref 80.0–100.0)
Platelets: 231 10*3/uL (ref 150–400)
RBC: 3.09 MIL/uL — ABNORMAL LOW (ref 4.22–5.81)
RDW: 13.2 % (ref 11.5–15.5)
WBC: 8.3 10*3/uL (ref 4.0–10.5)
nRBC: 0 % (ref 0.0–0.2)

## 2023-10-08 LAB — GLUCOSE, CAPILLARY
Glucose-Capillary: 117 mg/dL — ABNORMAL HIGH (ref 70–99)
Glucose-Capillary: 116 mg/dL — ABNORMAL HIGH (ref 70–99)
Glucose-Capillary: 119 mg/dL — ABNORMAL HIGH (ref 70–99)
Glucose-Capillary: 138 mg/dL — ABNORMAL HIGH (ref 70–99)
Glucose-Capillary: 219 mg/dL — ABNORMAL HIGH (ref 70–99)

## 2023-10-08 LAB — CULTURE, BLOOD (ROUTINE X 2)

## 2023-10-08 MED ORDER — SODIUM CHLORIDE 0.9 % IV SOLN: INTRAVENOUS | Status: AC | PRN

## 2023-10-08 MED ORDER — ALBUMIN HUMAN 25 % IV SOLN
INTRAVENOUS | Status: AC
  Filled 2023-10-08: qty 100

## 2023-10-08 MED ORDER — HEPARIN SODIUM (PORCINE) 1000 UNIT/ML IJ SOLN
INTRAMUSCULAR | Status: AC
  Filled 2023-10-08: qty 4

## 2023-10-08 MED ORDER — LOPERAMIDE HCL 2 MG PO CAPS
4.0000 mg | ORAL_CAPSULE | Freq: Once | ORAL | Status: AC
  Administered 2023-10-08: 4 mg via ORAL
  Filled 2023-10-08: qty 2

## 2023-10-08 MED ORDER — CALCITRIOL 0.25 MCG PO CAPS
ORAL_CAPSULE | ORAL | Status: AC
  Filled 2023-10-08: qty 1

## 2023-10-08 NOTE — Plan of Care (Signed)
  Problem: Education: Goal: Knowledge of General Education information will improve Description: Including pain rating scale, medication(s)/side effects and non-pharmacologic comfort measures Outcome: Progressing   Problem: Education: Goal: Knowledge of General Education information will improve Description: Including pain rating scale, medication(s)/side effects and non-pharmacologic comfort measures Outcome: Progressing   Problem: Nutrition: Goal: Adequate nutrition will be maintained Outcome: Completed/Met   Problem: Nutrition: Goal: Adequate nutrition will be maintained Outcome: Completed/Met

## 2023-10-08 NOTE — Progress Notes (Signed)
PROGRESS NOTE   Philip Benjamin  ZOX:[id number] DOB: October 30, 1950 DOA: 09/29/2023 PCP: Kirstie Peri, MD   Chief Complaint  Patient presents with   Weakness   Level of care: Med-Surg  Brief Admission History:  72 year old male with a history of hypertension, diabetes mellitus, hyperlipidemia, newly diagnosed ESRD, and anemia of CKD  The patient was recently hospitalized from 08/14/2023-08/20/2023 where he was declared new ESRD and initiated on dialysis. TDC was placed on 08/15/2023, and the patient was initiated on HD.    Pt is admitted secondary to general malaise, nausea and weakness; patient has also expressed intermittent nonproductive coughing spells and mild shortness of breath.  Patient denies chills, fever, overt bleeding, focal weaknesses, headache or any other complaints.   Of note, patient has been released from the skilled nursing facility about 1 week prior to this hospitalization.   Workup in the ED demonstrated positive COVID-19 infection, mild dehydration and hypokalemia.   TRH has been consulted to place in the hospital for further evaluation and management.  The patient also had a recent hospital admission from 08/21/2023 to 08/25/2023 when he was treated for sepsis secondary to Pseudomonas UTI and lobar pneumonia.  He was treated with IV cefepime, and discharged with Cipro for 2 additional doses.  The patient was placed on intravenous steroids for his COVID 19 infection.  His PCT was 2.05.  As result, the patient was initially started on ceftriaxone and azithromycin for presumptive superimposed pneumonia.  He gradually improved, but WBC increased.  UA suggested significant pyuria with WBC>50.  His ceftriaxone was switched to cefepime given his previous Pseudomonas in the urine.  The patient remained afebrile and hemodynamically stable.  Unfortunately, he began developing right upper quadrant abdominal pain with increase in WBC despite being on antibiotics.  CT of the  abdomen and pelvis was performed and showed distended gallbladder with pericholecystic stranding and loculated pericholecystic fluid.  There was concern for pericholecystic abscess.  General surgery was consulted.  IR was also subsequently consulted for percutaneous cholecystotomy tube and drainage of the abscess.   Assessment and Plan:  COVID 19 Pneumonia - treated  -stable on RA -presented with intermittent nonproductive coughing spells and just mild short winded sensation with activity. -treated with supportive care, bronchodilators and steroids -CRP 32.9>>9.6>>6.1 -Patient was started on steroids the time of admission>>last dose completed 10/02/23 -TOC reports he will be cleared from covid standpoint to go to SNF on Friday 12/20.    Presumed bacterial pneumonia - TREATED  -Patient with elevated leukocytosis and also elevated procalcitonin -PCT 2.05 -initially ceftriaxone and azithro -Continue mucolytic's, flutter valve and bronchodilator as mentioned above -switched over to zosyn by surgery for anaerobic coverage for gallbladder issue   Cholecystitis with concern for abscess -10/03/2023 CT abdomen--distended gallbladder with pericholecystic stranding and loculated pericholecystic fluid in the right subhepatic space -Appreciate General Surgery, case discussed with Dr. Robyne Peers -Patient remains afebrile hemodynamically stable with minimal right upper quadrant pain -s/p IR cholecystotomy tube placement with drainage of abscess on 12/16 -complete Zosyn on Friday when discharging to SNF so no need to discharge on further antibiotics per surgery  - he will need to follow up with Dr. Robyne Peers outpatient to arrange for cholecystectomy   ESRD -Appreciate nephrology managing inpatient HD -regular outpatient treatments on MWF   Pyuria/UTI -sent urine culture 12/11--multiple organisms -remains on IV zosyn with plan to stop it right before DC to SNF on Friday 12/20   Diabetes mellitus  type 2 -08/14/2023 hemoglobin A1c 5.6 -Continue  NovoLog sliding scale -increased Semglee 18 units; added novolog 6 units TID with meals -sensitive sliding scale; CBG 5x per day  CBG (last 3)  Recent Labs    10/08/23 0737 10/08/23 1410 10/08/23 1626  GLUCAP 116* 119* 138*   hypokalemia -Repleted -Continue to follow electrolytes -correction on HD   hypertension/hyperlipidemia -Continue statins and metoprolol -12/14--now hold statin with mild LFT elevation   BPH -Continue Flomax   increased intraocular pressure/glaucoma -Continue the use of latanoprost and also dorzolamide/timolol. -Continue outpatient follow-up with ophthalmology service.   depression/anxiety -Continue as needed Xanax and the use of Prozac.   generalized weakness -Most likely in the setting of pneumonia/COVID -PT evaluation>>SNF with plan to DC on Fri 12/20 due to Covid isolation period   anemia of chronic kidney disease -Stable hemoglobin - Hgb stable in 9 range, no bleeding. On ESA per nephrology    Diarrhea - resolved  -c diff neg -check stool pathogen panel--pending -prn loperamide   DVT prophylaxis: sq heparin  Code Status: Full  Family Communication:  Disposition: DC to SNF on 12/20 due to covid isolation period   Consultants:  Nephrology Surgery IR  Procedures:  Hemodialysis 12/16 IR cholecystostomy drain 12/16  Antimicrobials:  Zosyn>>12/16  Subjective: Seen while having dialysis.  No specific complaints.   Objective: Vitals:   10/08/23 1230 10/08/23 1259 10/08/23 1308 10/08/23 1441  BP: 122/73 112/69 117/72 117/63  Pulse: 73 74 73 74  Resp: 19 16 18 19   Temp:   97.8 F (36.6 C) 97.9 F (36.6 C)  TempSrc:    Oral  SpO2: 100% 100% 100% 100%  Weight:   87.6 kg   Height:        Intake/Output Summary (Last 24 hours) at 10/08/2023 1653 Last data filed at 10/08/2023 1308 Gross per 24 hour  Intake 515 ml  Output 311 ml  Net 204 ml   Filed Weights   10/06/23 1335  10/08/23 0907 10/08/23 1308  Weight: 87.3 kg 88.6 kg 87.6 kg   Examination:  General exam: chronically ill appearing male; Appears calm and comfortable.    Respiratory system: no rales heard. Respiratory effort normal. Cardiovascular system: normal S1 & S2 heard. No JVD, murmurs, rubs, gallops or clicks. No pedal edema. Gastrointestinal system: Abdomen is nondistended, soft and nontender. No organomegaly or masses felt. Normal bowel sounds heard. 2 drains seen with bilious fluid drainage seen.  Central nervous system: Alert and oriented to person, place. No focal neurological deficits. Extremities: Symmetric 5 x 5 power. Skin: No rashes, lesions or ulcers. Psychiatry: Judgement and insight appear diminished. Mood & affect flat.   Data Reviewed: I have personally reviewed following labs and imaging studies  CBC: Recent Labs  Lab 10/05/23 0946 10/06/23 0454 10/07/23 0430 10/08/23 0441 10/08/23 0706  WBC 20.7* 16.1* 8.4 8.3 8.3  NEUTROABS  --   --  6.4 6.0  --   HGB 10.2* 9.2* 9.1* 9.3* 9.0*  HCT 31.2* 28.7* 28.8* 28.7* 28.4*  MCV 90.2 89.7 90.9 91.4 91.9  PLT 176 208 202 222 231    Basic Metabolic Panel: Recent Labs  Lab 10/04/23 1515 10/05/23 0946 10/06/23 0454 10/07/23 0430 10/08/23 0441 10/08/23 0705  NA 132* 130* 130* 134* 137 136  K 3.0* 3.8 3.5 3.6 3.4* 3.5  CL 97* 94* 94* 96* 100 100  CO2 24 25 24 27 26 25   GLUCOSE 85 282* 276* 252* 121* 115*  BUN 67* 45* 53* 34* 37* 37*  CREATININE 5.10* 3.86* 4.63* 3.74* 4.37*  4.52*  CALCIUM 8.5* 8.3* 8.4* 8.3* 8.6* 8.5*  MG  --   --   --  1.9  --   --   PHOS 3.3  --   --   --   --  3.9    CBG: Recent Labs  Lab 10/07/23 2127 10/08/23 0309 10/08/23 0737 10/08/23 1410 10/08/23 1626  GLUCAP 91 117* 116* 119* 138*    Recent Results (from the past 240 hours)  Resp panel by RT-PCR (RSV, Flu A&B, Covid) Anterior Nasal Swab     Status: Abnormal   Collection Time: 09/29/23 10:05 AM   Specimen: Anterior Nasal Swab   Result Value Ref Range Status   SARS Coronavirus 2 by RT PCR POSITIVE (A) NEGATIVE Final    Comment: (NOTE) SARS-CoV-2 target nucleic acids are DETECTED.  The SARS-CoV-2 RNA is generally detectable in upper respiratory specimens during the acute phase of infection. Positive results are indicative of the presence of the identified virus, but do not rule out bacterial infection or co-infection with other pathogens not detected by the test. Clinical correlation with patient history and other diagnostic information is necessary to determine patient infection status. The expected result is Negative.  Fact Sheet for Patients: BloggerCourse.com  Fact Sheet for Healthcare Providers: SeriousBroker.it  This test is not yet approved or cleared by the [place] FDA and  has been authorized for detection and/or diagnosis of SARS-CoV-2 by FDA under an Emergency Use Authorization (EUA).  This EUA will remain in effect (meaning this test can be used) for the duration of  the COVID-19 declaration under Section 564(b)(1) of the A ct, 21 U.S.C. section 360bbb-3(b)(1), unless the authorization is terminated or revoked sooner.     Influenza A by PCR NEGATIVE NEGATIVE Final   Influenza B by PCR NEGATIVE NEGATIVE Final    Comment: (NOTE) The Xpert Xpress SARS-CoV-2/FLU/RSV plus assay is intended as an aid in the diagnosis of influenza from Nasopharyngeal swab specimens and should not be used as a sole basis for treatment. Nasal washings and aspirates are unacceptable for Xpert Xpress SARS-CoV-2/FLU/RSV testing.  Fact Sheet for Patients: BloggerCourse.com  Fact Sheet for Healthcare Providers: SeriousBroker.it  This test is not yet approved or cleared by the [place] FDA and has been authorized for detection and/or diagnosis of SARS-CoV-2 by FDA under an Emergency Use Authorization (EUA).  This EUA will remain in effect (meaning this test can be used) for the duration of the COVID-19 declaration under Section 564(b)(1) of the Act, 21 U.S.C. section 360bbb-3(b)(1), unless the authorization is terminated or revoked.     Resp Syncytial Virus by PCR NEGATIVE NEGATIVE Final    Comment: (NOTE) Fact Sheet for Patients: BloggerCourse.com  Fact Sheet for Healthcare Providers: SeriousBroker.it  This test is not yet approved or cleared by the [place] FDA and has been authorized for detection and/or diagnosis of SARS-CoV-2 by FDA under an Emergency Use Authorization (EUA). This EUA will remain in effect (meaning this test can be used) for the duration of the COVID-19 declaration under Section 564(b)(1) of the Act, 21 U.S.C. section 360bbb-3(b)(1), unless the authorization is terminated or revoked.  Performed at [facility], [address] [place], [place] [address]   Urine Culture (for pregnant, neutropenic or urologic patients or patients with an indwelling urinary catheter)     Status: Abnormal   Collection Time: 09/29/23  3:24 PM   Specimen: Urine, Clean Catch  Result Value Ref Range Status   Specimen Description   Final  URINE, CLEAN CATCH Performed at [facility], [address], [place], [place] [address]    Special Requests   Final    NONE Performed at [facility], [address] [place], [place] [address]    Culture MULTIPLE SPECIES PRESENT, SUGGEST RECOLLECTION (A)  Final   Report Status 10/02/2023 FINAL  Final  C Difficile Quick Screen w PCR reflex     Status: None   Collection Time: 10/01/23 11:15 AM   Specimen: STOOL  Result Value Ref Range Status   C Diff antigen NEGATIVE NEGATIVE Final   C Diff toxin NEGATIVE NEGATIVE Final   C Diff interpretation No C. difficile detected.  Final    Comment: Performed at [facility], [address] [place], [place] [address]  Gastrointestinal Panel by PCR ,  Stool     Status: None   Collection Time: 10/02/23 10:15 AM   Specimen: Stool  Result Value Ref Range Status   Campylobacter species NOT DETECTED NOT DETECTED Final   Plesimonas shigelloides NOT DETECTED NOT DETECTED Final   Salmonella species NOT DETECTED NOT DETECTED Final   Yersinia enterocolitica NOT DETECTED NOT DETECTED Final   Vibrio species NOT DETECTED NOT DETECTED Final   Vibrio cholerae NOT DETECTED NOT DETECTED Final   Enteroaggregative E coli (EAEC) NOT DETECTED NOT DETECTED Final   Enteropathogenic E coli (EPEC) NOT DETECTED NOT DETECTED Final   Enterotoxigenic E coli (ETEC) NOT DETECTED NOT DETECTED Final   Shiga like toxin producing E coli (STEC) NOT DETECTED NOT DETECTED Final   Shigella/Enteroinvasive E coli (EIEC) NOT DETECTED NOT DETECTED Final   Cryptosporidium NOT DETECTED NOT DETECTED Final   Cyclospora cayetanensis NOT DETECTED NOT DETECTED Final   Entamoeba histolytica NOT DETECTED NOT DETECTED Final   Giardia lamblia NOT DETECTED NOT DETECTED Final   Adenovirus F40/41 NOT DETECTED NOT DETECTED Final   Astrovirus NOT DETECTED NOT DETECTED Final   Norovirus GI/GII NOT DETECTED NOT DETECTED Final   Rotavirus A NOT DETECTED NOT DETECTED Final   Sapovirus (I, II, IV, and V) NOT DETECTED NOT DETECTED Final    Comment: Performed at [facility], [address] [place], [place] [address]  Culture, blood (Routine X 2) w Reflex to ID Panel     Status: None   Collection Time: 10/03/23  2:37 PM   Specimen: BLOOD  Result Value Ref Range Status   Specimen Description BLOOD RIGHT ANTECUBITAL  Final   Special Requests   Final    Blood Culture results may not be optimal due to an inadequate volume of blood received in culture bottles BOTTLES DRAWN AEROBIC ONLY   Culture   Final    NO GROWTH 5 DAYS Performed at [facility], [address], [place], [place] [address]    Report Status 10/08/2023 FINAL  Final  Culture, blood (Routine X 2) w Reflex to ID Panel      Status: None   Collection Time: 10/03/23  2:45 PM   Specimen: BLOOD  Result Value Ref Range Status   Specimen Description BLOOD BLOOD LEFT HAND  Final   Special Requests   Final    Blood Culture results may not be optimal due to an inadequate volume of blood received in culture bottles BOTTLES DRAWN AEROBIC ONLY   Culture   Final    NO GROWTH 5 DAYS Performed at [facility], [address], [place], [place] [address]    Report Status 10/08/2023 FINAL  Final  Aerobic/Anaerobic Culture w Gram Stain (surgical/deep wound)  Status: None (Preliminary result)   Collection Time: 10/06/23  3:32 PM   Specimen: BILE  Result Value Ref Range Status   Specimen Description   Final    BILE Performed at [facility], [address], [place], [place] [address]    Special Requests   Final    Normal Performed at [facility], [address] [place], [place] [address]    Gram Stain NO WBC SEEN NO ORGANISMS SEEN   Final   Culture   Final    NO GROWTH 2 DAYS NO ANAEROBES ISOLATED; CULTURE IN PROGRESS FOR 5 DAYS Performed at [facility], [address] [place], [place] [address]    Report Status PENDING  Incomplete     Radiology Studies: No results found.   Scheduled Meds:  calcitRIOL  0.25 mcg Oral Daily   Chlorhexidine Gluconate Cloth  6 each Topical Daily   Chlorhexidine Gluconate Cloth  6 each Topical Q0600   cycloSPORINE  1 drop Both Eyes BID   darbepoetin (ARANESP) injection - DIALYSIS  60 mcg Subcutaneous Q Wed-1800   dorzolamide-timolol  1 drop Both Eyes BID   FLUoxetine  20 mg Oral QHS   folic acid  1,000 mcg Oral Daily   heparin  5,000 Units Subcutaneous Q8H   insulin aspart  0-6 Units Subcutaneous TID WC   insulin aspart  6 Units Subcutaneous TID WC   insulin glargine-yfgn  18 Units Subcutaneous Daily   latanoprost  1 drop Left Eye QHS   multivitamin  1 tablet Oral QHS   sodium chloride flush  3 mL Intravenous Q12H   Continuous Infusions:  sodium chloride 10 mL/hr  at 10/08/23 0130   piperacillin-tazobactam (ZOSYN)  IV 2.25 g (10/08/23 1234)    LOS: 8 days   Time spent: 40 mins  Lashon Hillier Laural Benes, MD How to contact the [facility] Attending or Consulting provider 7A - 7P or covering provider during after hours 7P -7A, for this patient?  Check the care team in [facility] and look for a) attending/consulting TRH provider listed and b) the [facility] team listed Log into [email or web address] to find provider on call.  Locate the [facility] provider you are looking for under [facility] and page to a number that you can be directly reached. If you still have difficulty reaching the provider, please page the [facility] (Director on Call) for the Hospitalists listed on amion for assistance.  10/08/2023, 4:53 PM

## 2023-10-08 NOTE — Procedures (Signed)
HD Note:  Some information was entered later than the data was gathered due to patient care needs. The stated time with the data is accurate.  Brought patient in bed to unit.  No issues with transporting patient.  Alert and oriented.   Informed consent signed and in chart.   Access used: Upper right chest HD tunneled catheter Access issues: none  Patient tolerated treatment well.   TX duration: 3.5  Alert, without acute distress.  Total UF removed: 1000 ml  Hand-off given to patient's nurse.   Transported back to the room, no issues with transporting patient.  Marielys Trinidad L. Dareen Piano, RN Kidney Dialysis Unit.

## 2023-10-08 NOTE — Progress Notes (Addendum)
Has not been to sleep but no complaints of pain or discomfort.  Drain dressings dry and intact and very little drainage in either at this point. Had an incontinent  bm.  Received xanax just now to help with sleep. Zosyn infusing.

## 2023-10-08 NOTE — Progress Notes (Signed)
Patient ID: Philip Benjamin, male   DOB: Feb 26, 1951, 72 y.o.   MRN: [id number]  S:  Last HD on 12/16 without any UF.  Seen and examined on dialysis.  Blood pressure 118/67 and HR 77.  RIJ tunn catheter in place.  Tolerating goal.  HD is going ok - had some diarrhea earlier.    O:BP 127/66   Pulse 72   Temp 97.9 F (36.6 C)   Resp 18   Ht 6\' 1"  (1.854 m)   Wt 88.6 kg   SpO2 100%   BMI 25.77 kg/m   Intake/Output Summary (Last 24 hours) at 10/08/2023 0934 Last data filed at 10/08/2023 0924 Gross per 24 hour  Intake 875 ml  Output 310 ml  Net 565 ml   Intake/Output: I/O last 3 completed shifts: In: 975 [P.O.:720; I.V.:5; IV Piggyback:250] Out: 500 [Urine:200; Drains:300]  Intake/Output this shift:  Total I/O In: 240 [P.O.:240] Out: -  Weight change:   General adult male in bed in no acute distress HEENT normocephalic atraumatic extraocular movements intact sclera anicteric Neck supple trachea midline Lungs clear to auscultation bilaterally normal work of breathing at rest  Heart S1S2 no rub Abdomen soft nontender nondistended Extremities no edema  Psych normal mood and affect Access RIJ tunn catheter in use   Recent Labs  Lab 10/02/23 0447 10/04/23 1515 10/05/23 0946 10/06/23 0454 10/07/23 0430 10/08/23 0441 10/08/23 0705  NA 130* 132* 130* 130* 134* 137 136  K 3.5 3.0* 3.8 3.5 3.6 3.4* 3.5  CL 94* 97* 94* 94* 96* 100 100  CO2 23 24 25 24 27 26 25   GLUCOSE 321* 85 282* 276* 252* 121* 115*  BUN 67* 67* 45* 53* 34* 37* 37*  CREATININE 4.77* 5.10* 3.86* 4.63* 3.74* 4.37* 4.52*  ALBUMIN 2.3* 2.2*  2.2* 2.2* 2.1* 2.0* 2.1* 2.2*  CALCIUM 8.4* 8.5* 8.3* 8.4* 8.3* 8.6* 8.5*  PHOS  --  3.3  --   --   --   --  3.9  AST 33 47* 48* 41 46* 57*  --   ALT 32 61* 64* 58* 61* 67*  --    Liver Function Tests: Recent Labs  Lab 10/06/23 0454 10/07/23 0430 10/08/23 0441 10/08/23 0705  AST 41 46* 57*  --   ALT 58* 61* 67*  --   ALKPHOS 90 81 76  --   BILITOT 0.8 0.6  0.4  --   PROT 5.4* 5.2* 5.5*  --   ALBUMIN 2.1* 2.0* 2.1* 2.2*   No results for input(s): "LIPASE", "AMYLASE" in the last 168 hours. No results for input(s): "AMMONIA" in the last 168 hours. CBC: Recent Labs  Lab 10/05/23 0946 10/06/23 0454 10/07/23 0430 10/08/23 0441 10/08/23 0706  WBC 20.7* 16.1* 8.4 8.3 8.3  NEUTROABS  --   --  6.4 6.0  --   HGB 10.2* 9.2* 9.1* 9.3* 9.0*  HCT 31.2* 28.7* 28.8* 28.7* 28.4*  MCV 90.2 89.7 90.9 91.4 91.9  PLT 176 208 202 222 231   Cardiac Enzymes: No results for input(s): "CKTOTAL", "CKMB", "CKMBINDEX", "TROPONINI" in the last 168 hours. CBG: Recent Labs  Lab 10/07/23 1116 10/07/23 1621 10/07/23 2127 10/08/23 0309 10/08/23 0737  GLUCAP 313* 190* 91 117* 116*    Iron Studies: No results for input(s): "IRON", "TIBC", "TRANSFERRIN", "FERRITIN" in the last 72 hours. Studies/Results: IR Perc Cholecystostomy Result Date: 10/06/2023 INDICATION: 72 year old male with acute perforated calculus cholecystitis and Peri cholecystic fluid collection concerning for abscess versus biloma. He presents  for percutaneous cholecystostomy tube placement and aspiration versus drainage of the secondary fluid collection as well. EXAM: CHOLECYSTOSTOMY; ABSCESS DRAINAGE MEDICATIONS: In patient currently receiving intravenous antibiotics. No additional prophylaxis administered. ANESTHESIA/SEDATION: Moderate (conscious) sedation was employed during this procedure. A total of Versed 2 mg and Fentanyl 100 mcg was administered intravenously by the Radiology nurse. Moderate Sedation Time: 20 minutes. The patient's level of consciousness and vital signs were monitored continuously by radiology nursing throughout the procedure under my direct supervision. FLUOROSCOPY TIME:  Radiation exposure index: 30 mGy reference air kerma COMPLICATIONS: None immediate. PROCEDURE: Informed written consent was obtained from the patient after a thorough discussion of the procedural risks,  benefits and alternatives. All questions were addressed. Maximal Sterile Barrier Technique was utilized including caps, mask, sterile gowns, sterile gloves, sterile drape, hand hygiene and skin antiseptic. A timeout was performed prior to the initiation of the procedure. The right upper quadrant was interrogated with ultrasound. A complex fluid collection is identified in the Peri cholecystic space. This appears enlarged compared to the most recent CT scan. The gallbladder is distended and demonstrates wall thickening. A suitable skin entry site for the percutaneous cholecystostomy tube was selected an local anesthesia was attained by infiltration with 1% lidocaine. A small dermatotomy was made. Under real-time ultrasound guidance, a 22 gauge Accustick needle was advanced along a short transhepatic course and into the gallbladder lumen. A 0.018 wire was then coiled in the gallbladder lumen. The needle was removed. The transitional dilator was advanced over the wire and into the gallbladder. Aspiration yields black bile which was sent for Gram stain and culture. A gentle injection of contrast material confirms that the transitional dilator is within the gallbladder lumen. A 0.035 wire was coiled in the gallbladder lumen. The transitional dilator was removed. The percutaneous tract was dilated to 10 [place]. A Cook 10 [place] all-purpose drainage catheter was advanced over the wire and formed. The catheter was connected to gravity bag drainage and secured to the skin with 0 Prolene suture. Attention was next turned to the complex pericholecystic fluid collection. A second suitable skin entry site was selected and marked. Local anesthesia was again attained by infiltration with 1% lidocaine. A small dermatotomy was made. Again, under real-time ultrasound guidance, an 18 gauge trocar needle was advanced into the fluid collection. A 0.035 wire was coiled in the fluid collection. The tract was dilated to 10 [place]. A 10  French drainage catheter was advanced over the wire and formed. Aspiration yields approximately 60 mL of turbid bilious fluid. The catheter was connected to JP bulb suction. IMPRESSION: 1. Successful placement of a transhepatic percutaneous cholecystostomy tube for the treatment of acute calculus cholecystitis. 2. Placement of a 10 French drainage catheter into the pericholecystic fluid collection with evacuation of bilious fluid. Electronically Signed   By: Malachy Moan M.D.   On: 10/06/2023 18:38   IR US ABSCESS DRAIN PLACEMENT Result Date: 10/06/2023 INDICATION: 72 year old male with acute perforated calculus cholecystitis and Peri cholecystic fluid collection concerning for abscess versus biloma. He presents for percutaneous cholecystostomy tube placement and aspiration versus drainage of the secondary fluid collection as well. EXAM: CHOLECYSTOSTOMY; ABSCESS DRAINAGE MEDICATIONS: In patient currently receiving intravenous antibiotics. No additional prophylaxis administered. ANESTHESIA/SEDATION: Moderate (conscious) sedation was employed during this procedure. A total of Versed 2 mg and Fentanyl 100 mcg was administered intravenously by the Radiology nurse. Moderate Sedation Time: 20 minutes. The patient's level of consciousness and vital signs were monitored continuously by radiology nursing throughout the procedure  under my direct supervision. FLUOROSCOPY TIME:  Radiation exposure index: 30 mGy reference air kerma COMPLICATIONS: None immediate. PROCEDURE: Informed written consent was obtained from the patient after a thorough discussion of the procedural risks, benefits and alternatives. All questions were addressed. Maximal Sterile Barrier Technique was utilized including caps, mask, sterile gowns, sterile gloves, sterile drape, hand hygiene and skin antiseptic. A timeout was performed prior to the initiation of the procedure. The right upper quadrant was interrogated with ultrasound. A complex fluid  collection is identified in the Peri cholecystic space. This appears enlarged compared to the most recent CT scan. The gallbladder is distended and demonstrates wall thickening. A suitable skin entry site for the percutaneous cholecystostomy tube was selected an local anesthesia was attained by infiltration with 1% lidocaine. A small dermatotomy was made. Under real-time ultrasound guidance, a 22 gauge Accustick needle was advanced along a short transhepatic course and into the gallbladder lumen. A 0.018 wire was then coiled in the gallbladder lumen. The needle was removed. The transitional dilator was advanced over the wire and into the gallbladder. Aspiration yields black bile which was sent for Gram stain and culture. A gentle injection of contrast material confirms that the transitional dilator is within the gallbladder lumen. A 0.035 wire was coiled in the gallbladder lumen. The transitional dilator was removed. The percutaneous tract was dilated to 10 [place]. A Cook 10 [place] all-purpose drainage catheter was advanced over the wire and formed. The catheter was connected to gravity bag drainage and secured to the skin with 0 Prolene suture. Attention was next turned to the complex pericholecystic fluid collection. A second suitable skin entry site was selected and marked. Local anesthesia was again attained by infiltration with 1% lidocaine. A small dermatotomy was made. Again, under real-time ultrasound guidance, an 18 gauge trocar needle was advanced into the fluid collection. A 0.035 wire was coiled in the fluid collection. The tract was dilated to 10 [place]. A 10 French drainage catheter was advanced over the wire and formed. Aspiration yields approximately 60 mL of turbid bilious fluid. The catheter was connected to JP bulb suction. IMPRESSION: 1. Successful placement of a transhepatic percutaneous cholecystostomy tube for the treatment of acute calculus cholecystitis. 2. Placement of a 10 French drainage  catheter into the pericholecystic fluid collection with evacuation of bilious fluid. Electronically Signed   By: Malachy Moan M.D.   On: 10/06/2023 18:38    calcitRIOL  0.25 mcg Oral Daily   Chlorhexidine Gluconate Cloth  6 each Topical Daily   Chlorhexidine Gluconate Cloth  6 each Topical Q0600   cycloSPORINE  1 drop Both Eyes BID   darbepoetin (ARANESP) injection - DIALYSIS  60 mcg Subcutaneous Q Wed-1800   dorzolamide-timolol  1 drop Both Eyes BID   FLUoxetine  20 mg Oral QHS   folic acid  1,000 mcg Oral Daily   heparin  2,000 Units Dialysis Once in dialysis   heparin  5,000 Units Subcutaneous Q8H   insulin aspart  0-6 Units Subcutaneous TID WC   insulin aspart  6 Units Subcutaneous TID WC   insulin glargine-yfgn  18 Units Subcutaneous Daily   latanoprost  1 drop Left Eye QHS   multivitamin  1 tablet Oral QHS   sodium chloride flush  3 mL Intravenous Q12H    BMET    Component Value Date/Time   NA 136 10/08/2023 0705   K 3.5 10/08/2023 0705   CL 100 10/08/2023 0705   CO2 25 10/08/2023 0705   GLUCOSE  115 (H) 10/08/2023 0705   BUN 37 (H) 10/08/2023 0705   CREATININE 4.52 (H) 10/08/2023 0705   CALCIUM 8.5 (L) 10/08/2023 0705   GFRNONAA 13 (L) 10/08/2023 0705   GFRAA 44 (L) 01/14/2018 1112   CBC    Component Value Date/Time   WBC 8.3 10/08/2023 0706   RBC 3.09 (L) 10/08/2023 0706   HGB 9.0 (L) 10/08/2023 0706   HCT 28.4 (L) 10/08/2023 0706   PLT 231 10/08/2023 0706   MCV 91.9 10/08/2023 0706   MCH 29.1 10/08/2023 0706   MCHC 31.7 10/08/2023 0706   RDW 13.2 10/08/2023 0706   LYMPHSABS 1.1 10/08/2023 0441   MONOABS 1.1 (H) 10/08/2023 0441   EOSABS 0.1 10/08/2023 0441   BASOSABS 0.0 10/08/2023 0441    Outpatient HD orders: [facility], MWF. 4 hours. EDW 90kg. TDC (has appt coming up with [facility] on 10/07/23 for perm access consideration). Flow rates: 350/500. 2K, 2.5Ca. Heparin: 1000 units/hr. Meds: Mircera every 4 weeks (last dose 09/15/23), Venofer 50mg  every  Monday.   Assessment/Plan:   Covid-19 with superimposed bacterial pneumonia - per primary team.  Being treated for concurrent bacterial PNA with abx per primary team.    Diarrhea - c diff neg, GI panel negative.  Supportive care per primary team  Cholecystitis - seen by CT scan with distended gallbladder and pericholecystic stranding and loculated fluid.  S/p Surgery evaluation.  IR placed cholecystostomy tube and drain on 10/06/23.  Abx per primary team.  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 Phos stable.  Disposition - per primary team   Estanislado Emms, MD 9:55 AM 10/08/2023

## 2023-10-08 NOTE — Progress Notes (Signed)
Mobility Specialist Progress Note:    10/08/23 1400  Mobility  Activity Refused mobility   Pt refused mobility d/t wanting to rest after dialysis. All needs met.   Lawerance Bach Mobility Specialist Please contact via Special educational needs teacher or  Rehab office at [phone]

## 2023-10-09 DIAGNOSIS — I10 Essential (primary) hypertension: Secondary | ICD-10-CM

## 2023-10-09 DIAGNOSIS — U071 COVID-19: Secondary | ICD-10-CM | POA: Diagnosis not present

## 2023-10-09 DIAGNOSIS — N186 End stage renal disease: Secondary | ICD-10-CM | POA: Diagnosis not present

## 2023-10-09 DIAGNOSIS — K810 Acute cholecystitis: Secondary | ICD-10-CM | POA: Diagnosis not present

## 2023-10-09 LAB — CBC WITH DIFFERENTIAL/PLATELET
Abs Immature Granulocytes: 0.04 10*3/uL (ref 0.00–0.07)
Basophils Absolute: 0 10*3/uL (ref 0.0–0.1)
Basophils Relative: 0 %
Eosinophils Absolute: 0.1 10*3/uL (ref 0.0–0.5)
Eosinophils Relative: 2 %
HCT: 26.5 % — ABNORMAL LOW (ref 39.0–52.0)
Hemoglobin: 8.6 g/dL — ABNORMAL LOW (ref 13.0–17.0)
Immature Granulocytes: 1 %
Lymphocytes Relative: 16 %
Lymphs Abs: 1.1 10*3/uL (ref 0.7–4.0)
MCH: 29.2 pg (ref 26.0–34.0)
MCHC: 32.5 g/dL (ref 30.0–36.0)
MCV: 89.8 fL (ref 80.0–100.0)
Monocytes Absolute: 0.9 10*3/uL (ref 0.1–1.0)
Monocytes Relative: 12 %
Neutro Abs: 4.7 10*3/uL (ref 1.7–7.7)
Neutrophils Relative %: 69 %
Platelets: 232 10*3/uL (ref 150–400)
RBC: 2.95 MIL/uL — ABNORMAL LOW (ref 4.22–5.81)
RDW: 13.1 % (ref 11.5–15.5)
WBC: 6.8 10*3/uL (ref 4.0–10.5)
nRBC: 0 % (ref 0.0–0.2)

## 2023-10-09 LAB — COMPREHENSIVE METABOLIC PANEL
ALT: 76 U/L — ABNORMAL HIGH (ref 0–44)
AST: 58 U/L — ABNORMAL HIGH (ref 15–41)
Albumin: 2.1 g/dL — ABNORMAL LOW (ref 3.5–5.0)
Alkaline Phosphatase: 77 U/L (ref 38–126)
Anion gap: 9 (ref 5–15)
BUN: 18 mg/dL (ref 8–23)
CO2: 27 mmol/L (ref 22–32)
Calcium: 8.4 mg/dL — ABNORMAL LOW (ref 8.9–10.3)
Chloride: 100 mmol/L (ref 98–111)
Creatinine, Ser: 3.36 mg/dL — ABNORMAL HIGH (ref 0.61–1.24)
GFR, Estimated: 19 mL/min — ABNORMAL LOW (ref >60)
Glucose, Bld: 131 mg/dL — ABNORMAL HIGH (ref 70–99)
Potassium: 3.4 mmol/L — ABNORMAL LOW (ref 3.5–5.1)
Sodium: 136 mmol/L (ref 135–145)
Total Bilirubin: 0.6 mg/dL (ref <1.2)
Total Protein: 5.4 g/dL — ABNORMAL LOW (ref 6.5–8.1)

## 2023-10-09 LAB — GLUCOSE, CAPILLARY
Glucose-Capillary: 130 mg/dL — ABNORMAL HIGH (ref 70–99)
Glucose-Capillary: 116 mg/dL — ABNORMAL HIGH (ref 70–99)
Glucose-Capillary: 268 mg/dL — ABNORMAL HIGH (ref 70–99)
Glucose-Capillary: 223 mg/dL — ABNORMAL HIGH (ref 70–99)
Glucose-Capillary: 166 mg/dL — ABNORMAL HIGH (ref 70–99)

## 2023-10-09 MED ORDER — CHLORHEXIDINE GLUCONATE CLOTH 2 % EX PADS
6.0000 | MEDICATED_PAD | Freq: Every day | CUTANEOUS | Status: DC
  Administered 2023-10-10: 6 via TOPICAL

## 2023-10-09 MED ORDER — ALPRAZOLAM 0.25 MG PO TABS: 0.2500 mg | ORAL_TABLET | Freq: Three times a day (TID) | ORAL | 0 refills | Status: DC | PRN

## 2023-10-09 NOTE — Progress Notes (Signed)
Physical Therapy Treatment Patient Details Name: Philip Benjamin MRN: [id number] DOB: [birth year] Today's Date: 10/09/2023   History of Present Illness 72 year old male with a history of hypertension, diabetes mellitus, hyperlipidemia, newly diagnosed ESRD, and anemia of CKD     The patient was recently hospitalized from 08/14/2023-08/20/2023 where he was declared new ESRD and initiated on dialysis. TDC was placed on 08/15/2023, and the patient was initiated on HD.       secondary to general malaise, nausea and weakness; patient has also expressed intermittent nonproductive coughing spells and mild shortness of breath.  Patient denies chills, fever, overt bleeding, focal weaknesses, headache or any other complaints.     Of note, patient has been released from the skilled nursing facility about 1 week prior to this hospitalization.     Workup in the ED demonstrated positive COVID-19 infection, mild dehydration and hypokalemia.    PT Comments  Pt up in chair, agreeable and motivated to participate with therapy today.  PT with close supervision/CGA with transfer but required min-mod assist with ambulation due ot weakness and instability.  TP with most difficulty keeping balance when turning directions as tends to lean into extension.  Noted "giving way" of LE's due to weakness, however able to maintain stability with walker.  Pt independent with exercises completed in chair.  Progressing well overall.    If plan is discharge home, recommend the following: A lot of help with walking and/or transfers;A little help with bathing/dressing/bathroom;Help with stairs or ramp for entrance;Assistance with cooking/housework   Can travel by private vehicle     Yes  Equipment Recommendations  None recommended by PT    Recommendations for Other Services  none     Precautions / Restrictions Precautions Precautions: None Restrictions Weight Bearing Restrictions Per Provider Order: No     Mobility  Bed  Mobility    Pt was already up in chair                Transfers Overall transfer level: Needs assistance Equipment used: Rolling walker (2 wheels) Transfers: Sit to/from Stand, Bed to chair/wheelchair/BSC Sit to Stand: Contact guard assist           General transfer comment: slow labored unsteady movement    Ambulation/Gait Ambulation/Gait assistance: Min assist, Mod assist Gait Distance (Feet): 30 Feet Assistive device: Rolling walker (2 wheels) Gait Pattern/deviations: Decreased step length - right, Decreased step length - left, Decreased stride length, Steppage, Knees buckling Gait velocity: slow     General Gait Details: slow labored unsteady cadence with frequent steppage like gait with legs and occasional buckling of knees, limited mostly due to fatigue   Stairs             Wheelchair Mobility     Tilt Bed    Modified Rankin (Stroke Patients Only)          Cognition Arousal: Alert Behavior During Therapy: WFL for tasks assessed/performed Overall Cognitive Status: Within Functional Limits for tasks assessed                                          Exercises General Exercises - Lower Extremity Long Arc Quad: Seated, AROM, Strengthening, Both, 10 reps Hip ABduction/ADduction: AROM, Strengthening, Both, 10 reps, Seated Hip Flexion/Marching: Seated, AROM, Strengthening, Both, 10 reps Toe Raises: Seated, AROM, Strengthening, Both, 10 reps Heel Raises: Seated, AROM, Strengthening, Both, 10 reps  General Comments  Pt was unsteady when turning while ambulating        PT Goals (current goals can now be found in the care plan section)      Frequency    Min 3X/week      PT Plan  Continue with POC       AM-PAC PT "6 Clicks" Mobility   Outcome Measure  Help needed turning from your back to your side while in a flat bed without using bedrails?: None Help needed moving from lying on your back to sitting on the side  of a flat bed without using bedrails?: None Help needed moving to and from a bed to a chair (including a wheelchair)?: A Little Help needed standing up from a chair using your arms (e.g., wheelchair or bedside chair)?: A Little Help needed to walk in hospital room?: A Little Help needed climbing 3-5 steps with a railing? : A Lot 6 Click Score: 19    End of Session Equipment Utilized During Treatment: Gait belt Activity Tolerance: Patient tolerated treatment well;Patient limited by fatigue Patient left: in chair;with call bell/phone within reach Nurse Communication: Mobility status PT Visit Diagnosis: Unsteadiness on feet (R26.81);Other abnormalities of gait and mobility (R26.89);Muscle weakness (generalized) (M62.81)     Time: 6010-9323 PT Time Calculation (min) (ACUTE ONLY): 23 min  Charges:    $Gait Training: 8-22 mins $Therapeutic Exercise: 8-22 mins PT General Charges $$ ACUTE PT VISIT: 1 Visit                      Lurena Nida 10/09/2023, 11:31 AM  Lurena Nida, PTA/CLT [facility] Ph: [phone]

## 2023-10-09 NOTE — Progress Notes (Signed)
[facility] Progress Note     Subjective: Patient seen and examined.  He is resting comfortably in bed.  He is tolerating his diet without nausea and vomiting.  He has some soreness at the drain sites, but is otherwise doing well.  His biggest complaint is diarrhea.  Objective: Vital signs in last 24 hours: Temp:  [97.8 F (36.6 C)-98.3 F (36.8 C)] 98.3 F (36.8 C) (12/19 0304) Pulse Rate:  [65-77] 65 (12/19 0304) Resp:  [16-20] 18 (12/19 0304) BP: (112-132)/(63-80) 132/72 (12/19 0304) SpO2:  [99 %-100 %] 99 % (12/19 0304) Weight:  [87.6 kg] 87.6 kg (12/18 1308) Last BM Date : 10/08/23  Intake/Output from previous day: 12/18 0701 - 12/19 0700 In: 853.6 [P.O.:600; I.V.:8.6; IV Piggyback:245] Out: 476 [Urine:400; Drains:75] Intake/Output this shift: No intake/output data recorded.  General appearance: alert, cooperative, and no distress GI: Abdomen soft, nondistended, no percussion tenderness, nontender to palpation; no rigidity, guarding, rebound tenderness; cholecystostomy drain with 70 cc of bilious output in last 24 hours, JP drain with 5 cc of bilious output in the last 24 hours  Lab Results:  Recent Labs    10/08/23 0706 10/09/23 0425  WBC 8.3 6.8  HGB 9.0* 8.6*  HCT 28.4* 26.5*  PLT 231 232   BMET Recent Labs    10/08/23 0705 10/09/23 0425  NA 136 136  K 3.5 3.4*  CL 100 100  CO2 25 27  GLUCOSE 115* 131*  BUN 37* 18  CREATININE 4.52* 3.36*  CALCIUM 8.5* 8.4*   PT/INR No results for input(s): "LABPROT", "INR" in the last 72 hours.  Studies/Results: No results found.  Anti-infectives: Anti-infectives (From admission, onward)    Start     Dose/Rate Route Frequency Ordered Stop   10/07/23 1800  piperacillin-tazobactam (ZOSYN) 2.25 g in sodium chloride 0.9 % 50 mL IVPB        2.25 g 100 mL/hr over 30 Minutes Intravenous Every 8 hours 10/07/23 1553 10/11/23 1959   10/06/23 1600  piperacillin-tazobactam (ZOSYN) 2.25 g in sodium  chloride 0.9 % 50 mL IVPB  Status:  Discontinued        2.25 g 100 mL/hr over 30 Minutes Intravenous Every 8 hours 10/06/23 0914 10/07/23 1553   10/04/23 1600  piperacillin-tazobactam (ZOSYN) IVPB 2.25 g  Status:  Discontinued        2.25 g 100 mL/hr over 30 Minutes Intravenous Every 8 hours 10/04/23 1502 10/06/23 0914   10/03/23 1400  ceFEPIme (MAXIPIME) 2 g in sodium chloride 0.9 % 100 mL IVPB        2 g 200 mL/hr over 30 Minutes Intravenous Every M-W-F (Hemodialysis) 10/03/23 1033 10/03/23 1437   10/01/23 2000  ceFEPIme (MAXIPIME) 1 g in sodium chloride 0.9 % 100 mL IVPB  Status:  Discontinued        1 g 200 mL/hr over 30 Minutes Intravenous Every 24 hours 10/01/23 1830 10/03/23 1033   09/29/23 2015  azithromycin (ZITHROMAX) tablet 500 mg  Status:  Discontinued        500 mg Oral Daily 09/29/23 1929 10/04/23 1745   09/29/23 2000  cefTRIAXone (ROCEPHIN) 1 g in sodium chloride 0.9 % 100 mL IVPB  Status:  Discontinued        1 g 200 mL/hr over 30 Minutes Intravenous Every 24 hours 09/29/23 1929 10/01/23 1830   09/29/23 1345  cefTRIAXone (ROCEPHIN) 1 g in sodium chloride 0.9 % 100 mL IVPB  Status:  Discontinued  1 g 200 mL/hr over 30 Minutes Intravenous  Once 09/29/23 1333 09/29/23 1354   09/29/23 1345  azithromycin (ZITHROMAX) 500 mg in sodium chloride 0.9 % 250 mL IVPB  Status:  Discontinued        500 mg 250 mL/hr over 60 Minutes Intravenous  Once 09/29/23 1333 09/29/23 1354       Assessment/Plan:  Patient is a 72 year old male who was admitted with COVID-pneumonia and subsequently developed concern for acute cholecystitis.  -Status post percutaneous cholecystostomy tube in pericholecystic drain placement on 12/16 -Patient still without leukocytosis, WBC 6.8 -Patient currently on IV Zosyn, recommended 5 days of treatment for cholecystitis -Continue low-fat diet -Patient will follow-up with IR for evaluation of drains -Will follow-up with me after eval by IR to discuss need  for cholecystectomy -Patient stable for discharge from surgical standpoint.  He should be able to return to his facility tomorrow -Care per primary team -General Surgery to sign off.  Please call with any questions or concerns   LOS: 9 days    Kajuan Guyton A Tanea Moga 10/09/2023

## 2023-10-09 NOTE — TOC Progression Note (Signed)
Transition of Care [facility]) - Progression Note    Patient Details  Name: Philip Benjamin MRN: [id number] Date of Birth: [birth year]  Transition of Care [facility]) CM/SW Contact  Elliot Gault, LCSW Phone Number: 10/09/2023, 10:20 AM  Clinical Narrative:     TOC following. Anticipating dc tomorrow after HD. Updated Destiny at [facility] who confirms they can admit pt tomorrow as long as he no longer has COVID symptoms. Messaged RN to update.  TOC will follow.  Expected Discharge Plan: Skilled Nursing Facility Barriers to Discharge: SNF Covid  Expected Discharge Plan and Services In-house Referral: Clinical Social Work   Post Acute Care Choice: Skilled Nursing Facility Living arrangements for the past 2 months: Single Family Home Expected Discharge Date: 10/03/23                         HH Arranged: PT, RN HH Agency: [facility] Date [facility] Agency Contacted: 09/30/23 Time HH Agency Contacted: [phone] Representative spoke with at [facility] Agency: Clydie Braun   Social Determinants of Health (SDOH) Interventions SDOH Screenings   Food Insecurity: No Food Insecurity (09/29/2023)  Housing: Patient Declined (09/29/2023)  Transportation Needs: No Transportation Needs (09/29/2023)  Utilities: Not At Risk (09/29/2023)  Tobacco Use: Low Risk  (09/29/2023)  Health Literacy: Medium Risk (09/05/2023)   Received from [facility]    Readmission Risk Interventions    10/01/2023    7:48 AM 08/22/2023   12:48 PM 08/15/2023   12:29 PM  Readmission Risk Prevention Plan  Transportation Screening Complete Complete Complete  Home Care Screening  Complete Complete  Medication Review (RN CM)  Complete Complete  HRI or Home Care Consult Complete    Social Work Consult for Recovery Care Planning/Counseling Complete    Palliative Care Screening Not Applicable    Medication Review Oceanographer) Complete

## 2023-10-09 NOTE — Progress Notes (Addendum)
PROGRESS NOTE  Philip Benjamin ZOX:[id number] DOB: 1951-08-31 DOA: 09/29/2023 PCP: Kirstie Peri, MD  Brief History:  72 year old male with a history of hypertension, diabetes mellitus, hyperlipidemia, newly diagnosed ESRD, and anemia of CKD  The patient was recently hospitalized from 08/14/2023-08/20/2023 where he was declared new ESRD and initiated on dialysis. TDC was placed on 08/15/2023, and the patient was initiated on HD.    Pt is admitted secondary to general malaise, nausea and weakness; patient has also expressed intermittent nonproductive coughing spells and mild shortness of breath.  Patient denies chills, fever, overt bleeding, focal weaknesses, headache or any other complaints.   Of note, patient has been released from the skilled nursing facility about 1 week prior to this hospitalization.   Workup in the ED demonstrated positive COVID-19 infection, mild dehydration and hypokalemia.   TRH has been consulted to place in the hospital for further evaluation and management.  The patient also had a recent hospital admission from 08/21/2023 to 08/25/2023 when he was treated for sepsis secondary to Pseudomonas UTI and lobar pneumonia.  He was treated with IV cefepime, and discharged with Cipro for 2 additional doses.  The patient was placed on intravenous steroids for his COVID 19 infection.  His PCT was 2.05.  As result, the patient was initially started on ceftriaxone and azithromycin for presumptive superimposed pneumonia.  He gradually improved, but WBC increased.  UA suggested significant pyuria with WBC>50.  His ceftriaxone was switched to cefepime given his previous Pseudomonas in the urine.  The patient remained afebrile and hemodynamically stable.  Unfortunately, he began developing right upper quadrant abdominal pain with increase in WBC despite being on antibiotics.  CT of the abdomen and pelvis was performed and showed distended gallbladder with pericholecystic  stranding and loculated pericholecystic fluid.  There was concern for pericholecystic abscess.  General surgery was consulted.  IR was also subsequently consulted for percutaneous cholecystotomy tube and drainage of the abscess.   Assessment/Plan:  COVID 19 Pneumonia - treated  -stable on RA -presented with intermittent nonproductive coughing spells and just mild short winded sensation with activity. -treated with supportive care, bronchodilators and steroids -CRP 32.9>>9.6>>6.1 -Patient was started on steroids the time of admission>>last dose completed 10/02/23 -TOC reports he will be cleared from covid standpoint to go to SNF on Friday 12/20.    bacterial pneumonia - TREATED  -Patient with elevated leukocytosis and also elevated procalcitonin -PCT 2.05 -initially ceftriaxone and azithro -Continue mucolytic's, flutter valve and bronchodilator as mentioned above -switched over to zosyn by surgery for anaerobic coverage for gallbladder issue   Acute Cholecystitis with concern for abscess -10/03/2023 CT abdomen--distended gallbladder with pericholecystic stranding and loculated pericholecystic fluid in the right subhepatic space -Appreciate General Surgery, case discussed with Dr. Robyne Peers -Patient remains afebrile hemodynamically stable with minimal right upper quadrant pain -s/p IR cholecystotomy tube placement with drainage of abscess on 12/16 -complete Zosyn on Friday when discharging to SNF so no need to discharge on further antibiotics per surgery  - he will need to follow up with Dr. Robyne Peers outpatient to arrange for cholecystectomy -IR will follow up with patient regarding the drains   ESRD -Appreciate nephrology managing inpatient HD -regular outpatient treatments on MWF   Pyuria/UTI -sent urine culture 12/11--multiple organisms -remains on IV zosyn with plan to stop it right before DC to SNF on Friday 12/20   Diabetes mellitus type 2 -08/14/2023 hemoglobin A1c  5.6 -Continue NovoLog sliding scale -increased  Semglee 18 units; added novolog 6 units TID with meals -sensitive sliding scale; CBG 5x per day  CBG (last 3) hypokalemia -Repleted -Continue to follow electrolytes -correction on HD   hypertension/hyperlipidemia -Continue statins and metoprolol -12/14--now hold statin with mild LFT elevation   BPH -Continue Flomax   increased intraocular pressure/glaucoma -Continue the use of latanoprost and also dorzolamide/timolol. -Continue outpatient follow-up with ophthalmology service.   depression/anxiety -Continue as needed Xanax and the use of Prozac.   generalized weakness -Most likely in the setting of pneumonia/COVID -PT evaluation>>SNF with plan to DC on Fri 12/20 due to Covid isolation period   anemia of chronic kidney disease -Stable hemoglobin - Hgb stable in 9 range, no bleeding. On ESA per nephrology    Diarrhea -c diff neg -check stool pathogen panel--pending -prn loperamide     DVT prophylaxis: sq heparin  Code Status: Full  Family Communication: daughter 12/19 Disposition: DC to SNF on 12/20 due to covid isolation period   Consultants:  Nephrology Surgery IR  Procedures:  Hemodialysis 12/16 IR cholecystostomy drain 12/16   Antimicrobials:  Zosyn>>12/16          Subjective: Pt has intermittent loose stool.  Denies f/c, cp, sob, n/v/  Objective: Vitals:   10/08/23 1308 10/08/23 1441 10/08/23 2102 10/09/23 0304  BP: 117/72 117/63 128/80 132/72  Pulse: 73 74 77 65  Resp: 18 19 20 18   Temp: 97.8 F (36.6 C) 97.9 F (36.6 C) 98.3 F (36.8 C) 98.3 F (36.8 C)  TempSrc:  Oral Oral   SpO2: 100% 100% 100% 99%  Weight: 87.6 kg     Height:        Intake/Output Summary (Last 24 hours) at 10/09/2023 1724 Last data filed at 10/09/2023 0600 Gross per 24 hour  Intake 733.58 ml  Output 475 ml  Net 258.58 ml   Weight change:  Exam:  General:  Pt is alert, follows commands appropriately, not in  acute distress HEENT: No icterus, No thrush, No neck mass, [place]/AT Cardiovascular: RRR, S1/S2, no rubs, no gallops Respiratory: CTA bilaterally, no wheezing, no crackles, no rhonchi Abdomen: Soft/+BS, non tender, non distended, no guarding Extremities: No edema, No lymphangitis, No petechiae, No rashes, no synovitis   Data Reviewed: I have personally reviewed following labs and imaging studies Basic Metabolic Panel: Recent Labs  Lab 10/04/23 1515 10/05/23 0946 10/06/23 0454 10/07/23 0430 10/08/23 0441 10/08/23 0705 10/09/23 0425  NA 132*   < > 130* 134* 137 136 136  K 3.0*   < > 3.5 3.6 3.4* 3.5 3.4*  CL 97*   < > 94* 96* 100 100 100  CO2 24   < > 24 27 26 25 27   GLUCOSE 85   < > 276* 252* 121* 115* 131*  BUN 67*   < > 53* 34* 37* 37* 18  CREATININE 5.10*   < > 4.63* 3.74* 4.37* 4.52* 3.36*  CALCIUM 8.5*   < > 8.4* 8.3* 8.6* 8.5* 8.4*  MG  --   --   --  1.9  --   --   --   PHOS 3.3  --   --   --   --  3.9  --    < > = values in this interval not displayed.   Liver Function Tests: Recent Labs  Lab 10/05/23 0946 10/06/23 0454 10/07/23 0430 10/08/23 0441 10/08/23 0705 10/09/23 0425  AST 48* 41 46* 57*  --  58*  ALT 64* 58* 61* 67*  --  76*  ALKPHOS 101 90 81 76  --  77  BILITOT 1.0 0.8 0.6 0.4  --  0.6  PROT 5.5* 5.4* 5.2* 5.5*  --  5.4*  ALBUMIN 2.2* 2.1* 2.0* 2.1* 2.2* 2.1*   No results for input(s): "LIPASE", "AMYLASE" in the last 168 hours. No results for input(s): "AMMONIA" in the last 168 hours. Coagulation Profile: Recent Labs  Lab 10/05/23 0904  INR 1.2   CBC: Recent Labs  Lab 10/06/23 0454 10/07/23 0430 10/08/23 0441 10/08/23 0706 10/09/23 0425  WBC 16.1* 8.4 8.3 8.3 6.8  NEUTROABS  --  6.4 6.0  --  4.7  HGB 9.2* 9.1* 9.3* 9.0* 8.6*  HCT 28.7* 28.8* 28.7* 28.4* 26.5*  MCV 89.7 90.9 91.4 91.9 89.8  PLT 208 202 222 231 232   Cardiac Enzymes: No results for input(s): "CKTOTAL", "CKMB", "CKMBINDEX", "TROPONINI" in the last 168  hours. BNP: Invalid input(s): "POCBNP" CBG: Recent Labs  Lab 10/08/23 2059 10/09/23 0305 10/09/23 0723 10/09/23 1159 10/09/23 1654  GLUCAP 219* 130* 116* 268* 223*   HbA1C: No results for input(s): "HGBA1C" in the last 72 hours. Urine analysis:    Component Value Date/Time   COLORURINE AMBER (A) 09/29/2023 1524   APPEARANCEUR CLOUDY (A) 09/29/2023 1524   LABSPEC 1.017 09/29/2023 1524   PHURINE 5.0 09/29/2023 1524   GLUCOSEU NEGATIVE 09/29/2023 1524   HGBUR SMALL (A) 09/29/2023 1524   BILIRUBINUR NEGATIVE 09/29/2023 1524   KETONESUR NEGATIVE 09/29/2023 1524   PROTEINUR 100 (A) 09/29/2023 1524   NITRITE NEGATIVE 09/29/2023 1524   LEUKOCYTESUR MODERATE (A) 09/29/2023 1524   Sepsis Labs: @LABRCNTIP (procalcitonin:4,lacticidven:4) ) Recent Results (from the past 240 hours)  C Difficile Quick Screen w PCR reflex     Status: None   Collection Time: 10/01/23 11:15 AM   Specimen: STOOL  Result Value Ref Range Status   C Diff antigen NEGATIVE NEGATIVE Final   C Diff toxin NEGATIVE NEGATIVE Final   C Diff interpretation No C. difficile detected.  Final    Comment: Performed at [facility], [address], [place], [place] [address]  Gastrointestinal Panel by PCR , Stool     Status: None   Collection Time: 10/02/23 10:15 AM   Specimen: Stool  Result Value Ref Range Status   Campylobacter species NOT DETECTED NOT DETECTED Final   Plesimonas shigelloides NOT DETECTED NOT DETECTED Final   Salmonella species NOT DETECTED NOT DETECTED Final   Yersinia enterocolitica NOT DETECTED NOT DETECTED Final   Vibrio species NOT DETECTED NOT DETECTED Final   Vibrio cholerae NOT DETECTED NOT DETECTED Final   Enteroaggregative E coli (EAEC) NOT DETECTED NOT DETECTED Final   Enteropathogenic E coli (EPEC) NOT DETECTED NOT DETECTED Final   Enterotoxigenic E coli (ETEC) NOT DETECTED NOT DETECTED Final   Shiga like toxin producing E coli (STEC) NOT DETECTED NOT DETECTED Final    Shigella/Enteroinvasive E coli (EIEC) NOT DETECTED NOT DETECTED Final   Cryptosporidium NOT DETECTED NOT DETECTED Final   Cyclospora cayetanensis NOT DETECTED NOT DETECTED Final   Entamoeba histolytica NOT DETECTED NOT DETECTED Final   Giardia lamblia NOT DETECTED NOT DETECTED Final   Adenovirus F40/41 NOT DETECTED NOT DETECTED Final   Astrovirus NOT DETECTED NOT DETECTED Final   Norovirus GI/GII NOT DETECTED NOT DETECTED Final   Rotavirus A NOT DETECTED NOT DETECTED Final   Sapovirus (I, II, IV, and V) NOT DETECTED NOT DETECTED Final    Comment: Performed at [facility], [address], [place], [place] [address]  Culture, blood (Routine X 2) w Reflex to ID Panel     Status: None   Collection Time: 10/03/23  2:37 PM   Specimen: BLOOD  Result Value Ref Range Status   Specimen Description BLOOD RIGHT ANTECUBITAL  Final   Special Requests   Final    Blood Culture results may not be optimal due to an inadequate volume of blood received in culture bottles BOTTLES DRAWN AEROBIC ONLY   Culture   Final    NO GROWTH 5 DAYS Performed at [facility], [address], [place], [place] [address]    Report Status 10/08/2023 FINAL  Final  Culture, blood (Routine X 2) w Reflex to ID Panel     Status: None   Collection Time: 10/03/23  2:45 PM   Specimen: BLOOD  Result Value Ref Range Status   Specimen Description BLOOD BLOOD LEFT HAND  Final   Special Requests   Final    Blood Culture results may not be optimal due to an inadequate volume of blood received in culture bottles BOTTLES DRAWN AEROBIC ONLY   Culture   Final    NO GROWTH 5 DAYS Performed at [facility], [address] [place], [place] [address]    Report Status 10/08/2023 FINAL  Final  Aerobic/Anaerobic Culture w Gram Stain (surgical/deep wound)     Status: None (Preliminary result)   Collection Time: 10/06/23  3:32 PM   Specimen: BILE  Result Value Ref Range Status   Specimen Description   Final    BILE Performed at  [facility], [address], [place], [place] [address]    Special Requests   Final    Normal Performed at [facility], [address] [place], [place] [address]    Gram Stain NO WBC SEEN NO ORGANISMS SEEN   Final   Culture   Final    NO GROWTH 3 DAYS NO ANAEROBES ISOLATED; CULTURE IN PROGRESS FOR 5 DAYS Performed at [facility], [address], [place], [place] [address]    Report Status PENDING  Incomplete     Scheduled Meds:  calcitRIOL  0.25 mcg Oral Daily   Chlorhexidine Gluconate Cloth  6 each Topical Daily   Chlorhexidine Gluconate Cloth  6 each Topical Q0600   [START ON 10/10/2023] Chlorhexidine Gluconate Cloth  6 each Topical Q0600   cycloSPORINE  1 drop Both Eyes BID   darbepoetin (ARANESP) injection - DIALYSIS  60 mcg Subcutaneous Q Wed-1800   dorzolamide-timolol  1 drop Both Eyes BID   FLUoxetine  20 mg Oral QHS   folic acid  1,000 mcg Oral Daily   heparin  5,000 Units Subcutaneous Q8H   insulin aspart  0-6 Units Subcutaneous TID WC   insulin aspart  6 Units Subcutaneous TID WC   insulin glargine-yfgn  18 Units Subcutaneous Daily   latanoprost  1 drop Left Eye QHS   multivitamin  1 tablet Oral QHS   sodium chloride flush  3 mL Intravenous Q12H   Continuous Infusions:  piperacillin-tazobactam (ZOSYN)  IV 2.25 g (10/09/23 1330)    Procedures/Studies: IR Perc Cholecystostomy Result Date: 10/06/2023 INDICATION: 72 year old male with acute perforated calculus cholecystitis and Peri cholecystic fluid collection concerning for abscess versus biloma. He presents for percutaneous cholecystostomy tube placement and aspiration versus drainage of the secondary fluid collection as well. EXAM: CHOLECYSTOSTOMY; ABSCESS DRAINAGE MEDICATIONS: In patient currently receiving intravenous antibiotics. No additional prophylaxis administered. ANESTHESIA/SEDATION: Moderate (conscious) sedation was employed during this procedure. A total of Versed  2 mg and Fentanyl 100 mcg was  administered intravenously by the Radiology nurse. Moderate Sedation Time: 20 minutes. The patient's level of consciousness and vital signs were monitored continuously by radiology nursing throughout the procedure under my direct supervision. FLUOROSCOPY TIME:  Radiation exposure index: 30 mGy reference air kerma COMPLICATIONS: None immediate. PROCEDURE: Informed written consent was obtained from the patient after a thorough discussion of the procedural risks, benefits and alternatives. All questions were addressed. Maximal Sterile Barrier Technique was utilized including caps, mask, sterile gowns, sterile gloves, sterile drape, hand hygiene and skin antiseptic. A timeout was performed prior to the initiation of the procedure. The right upper quadrant was interrogated with ultrasound. A complex fluid collection is identified in the Peri cholecystic space. This appears enlarged compared to the most recent CT scan. The gallbladder is distended and demonstrates wall thickening. A suitable skin entry site for the percutaneous cholecystostomy tube was selected an local anesthesia was attained by infiltration with 1% lidocaine. A small dermatotomy was made. Under real-time ultrasound guidance, a 22 gauge Accustick needle was advanced along a short transhepatic course and into the gallbladder lumen. A 0.018 wire was then coiled in the gallbladder lumen. The needle was removed. The transitional dilator was advanced over the wire and into the gallbladder. Aspiration yields black bile which was sent for Gram stain and culture. A gentle injection of contrast material confirms that the transitional dilator is within the gallbladder lumen. A 0.035 wire was coiled in the gallbladder lumen. The transitional dilator was removed. The percutaneous tract was dilated to 10 [place]. A Cook 10 [place] all-purpose drainage catheter was advanced over the wire and formed. The catheter was connected to gravity bag drainage and secured to the  skin with 0 Prolene suture. Attention was next turned to the complex pericholecystic fluid collection. A second suitable skin entry site was selected and marked. Local anesthesia was again attained by infiltration with 1% lidocaine. A small dermatotomy was made. Again, under real-time ultrasound guidance, an 18 gauge trocar needle was advanced into the fluid collection. A 0.035 wire was coiled in the fluid collection. The tract was dilated to 10 [place]. A 10 French drainage catheter was advanced over the wire and formed. Aspiration yields approximately 60 mL of turbid bilious fluid. The catheter was connected to JP bulb suction. IMPRESSION: 1. Successful placement of a transhepatic percutaneous cholecystostomy tube for the treatment of acute calculus cholecystitis. 2. Placement of a 10 French drainage catheter into the pericholecystic fluid collection with evacuation of bilious fluid. Electronically Signed   By: Malachy Moan M.D.   On: 10/06/2023 18:38   IR US ABSCESS DRAIN PLACEMENT Result Date: 10/06/2023 INDICATION: 72 year old male with acute perforated calculus cholecystitis and Peri cholecystic fluid collection concerning for abscess versus biloma. He presents for percutaneous cholecystostomy tube placement and aspiration versus drainage of the secondary fluid collection as well. EXAM: CHOLECYSTOSTOMY; ABSCESS DRAINAGE MEDICATIONS: In patient currently receiving intravenous antibiotics. No additional prophylaxis administered. ANESTHESIA/SEDATION: Moderate (conscious) sedation was employed during this procedure. A total of Versed 2 mg and Fentanyl 100 mcg was administered intravenously by the Radiology nurse. Moderate Sedation Time: 20 minutes. The patient's level of consciousness and vital signs were monitored continuously by radiology nursing throughout the procedure under my direct supervision. FLUOROSCOPY TIME:  Radiation exposure index: 30 mGy reference air kerma COMPLICATIONS: None immediate.  PROCEDURE: Informed written consent was obtained from the patient after a thorough discussion of the procedural risks, benefits and alternatives. All questions were addressed. Maximal  Sterile Barrier Technique was utilized including caps, mask, sterile gowns, sterile gloves, sterile drape, hand hygiene and skin antiseptic. A timeout was performed prior to the initiation of the procedure. The right upper quadrant was interrogated with ultrasound. A complex fluid collection is identified in the Peri cholecystic space. This appears enlarged compared to the most recent CT scan. The gallbladder is distended and demonstrates wall thickening. A suitable skin entry site for the percutaneous cholecystostomy tube was selected an local anesthesia was attained by infiltration with 1% lidocaine. A small dermatotomy was made. Under real-time ultrasound guidance, a 22 gauge Accustick needle was advanced along a short transhepatic course and into the gallbladder lumen. A 0.018 wire was then coiled in the gallbladder lumen. The needle was removed. The transitional dilator was advanced over the wire and into the gallbladder. Aspiration yields black bile which was sent for Gram stain and culture. A gentle injection of contrast material confirms that the transitional dilator is within the gallbladder lumen. A 0.035 wire was coiled in the gallbladder lumen. The transitional dilator was removed. The percutaneous tract was dilated to 10 [place]. A Cook 10 [place] all-purpose drainage catheter was advanced over the wire and formed. The catheter was connected to gravity bag drainage and secured to the skin with 0 Prolene suture. Attention was next turned to the complex pericholecystic fluid collection. A second suitable skin entry site was selected and marked. Local anesthesia was again attained by infiltration with 1% lidocaine. A small dermatotomy was made. Again, under real-time ultrasound guidance, an 18 gauge trocar needle was advanced  into the fluid collection. A 0.035 wire was coiled in the fluid collection. The tract was dilated to 10 [place]. A 10 French drainage catheter was advanced over the wire and formed. Aspiration yields approximately 60 mL of turbid bilious fluid. The catheter was connected to JP bulb suction. IMPRESSION: 1. Successful placement of a transhepatic percutaneous cholecystostomy tube for the treatment of acute calculus cholecystitis. 2. Placement of a 10 French drainage catheter into the pericholecystic fluid collection with evacuation of bilious fluid. Electronically Signed   By: Malachy Moan M.D.   On: 10/06/2023 18:38   CT CHEST ABDOMEN PELVIS WO CONTRAST Result Date: 10/04/2023 CLINICAL DATA:  Upper abdominal pain, weakness, COVID pneumonia EXAM: CT CHEST, ABDOMEN AND PELVIS WITHOUT CONTRAST TECHNIQUE: Multidetector CT imaging of the chest, abdomen and pelvis was performed following the standard protocol without IV contrast. RADIATION DOSE REDUCTION: This exam was performed according to the departmental dose-optimization program which includes automated exposure control, adjustment of the mA and/or kV according to patient size and/or use of iterative reconstruction technique. COMPARISON:  08/21/2023 FINDINGS: CT CHEST FINDINGS Cardiovascular: Extensive multi-vessel coronary artery calcification. Global cardiac size within normal limits. No pericardial effusion. Central pulmonary arteries are of normal caliber. Thoracic aorta is unremarkable. Right internal jugular hemodialysis catheter tip noted within the low right atrium at the hepatocaval junction. Mediastinum/Nodes: No enlarged mediastinal, hilar, or axillary lymph nodes. Thyroid gland, trachea, and esophagus demonstrate no significant findings. Lungs/Pleura: Left lower lobe chronic consolidation and volume loss is again identified with traction bronchiectasis. Platelike atelectasis has developed within the right lower lobe with marked volume loss. Traction  bronchiectasis is noted suggesting some degree fibrotic change. Similar, though milder changes are noted within the basilar right middle lobe. No superimposed confluent pulmonary infiltrate. No pneumothorax or pleural effusion. Musculoskeletal: No chest wall mass or suspicious bone lesions identified. CT ABDOMEN PELVIS FINDINGS Hepatobiliary: The gallbladder is distended and there is pericholecystic inflammatory  stranding in keeping with changes of acute cholecystitis. There is loculated pericholecystic fluid seen within the right subhepatic space along the peritoneal wall, new since prior examination. Gallbladder perforation and pericholecystic abscess formation is not excluded. This measures 5.5 x 2.1 cm at axial image # 72/5. The liver is unremarkable. No intra or extrahepatic biliary ductal dilation. Pancreas: Unremarkable Spleen: Unremarkable Adrenals/Urinary Tract: Adrenal glands are unremarkable. Kidneys are normal, without renal calculi, focal lesion, or hydronephrosis. Bladder is unremarkable. Stomach/Bowel: Stomach is within normal limits. Appendix appears normal. No evidence of bowel wall thickening, distention, or inflammatory changes. Mild sigmoid diverticulosis. Vascular/Lymphatic: Aortic atherosclerosis. No enlarged abdominal or pelvic lymph nodes. Reproductive: Mild prostatic hypertrophy Other: Tiny fat containing umbilical hernia. No abdominopelvic ascites. Musculoskeletal: Osseous structures are age-appropriate. No acute bone abnormality. IMPRESSION: 1. Acute cholecystitis. Loculated pericholecystic fluid within the right subhepatic space along the peritoneal wall, new since prior examination. Gallbladder perforation and pericholecystic abscess formation is not excluded. Correlation with liver enzymes is recommended. 2. Extensive multi-vessel coronary artery calcification. 3. Chronic left lower lobe consolidation and volume loss with traction bronchiectasis. Interval development of platelike  atelectasis within the right lower lobe with marked volume loss. Similar, though milder changes are noted within the basilar right middle lobe. 4. Mild sigmoid diverticulosis. 5. Mild prostatic hypertrophy. Aortic Atherosclerosis (ICD10-I70.0). Electronically Signed   By: Helyn Numbers M.D.   On: 10/04/2023 00:08   NM Pulmonary Perfusion Result Date: 10/03/2023 CLINICAL DATA:  Chest pain, dyspnea, COVID-19 pneumonia EXAM: NUCLEAR MEDICINE PERFUSION LUNG SCAN TECHNIQUE: Perfusion images were obtained in multiple projections after intravenous injection of radiopharmaceutical. Ventilation scans intentionally deferred if perfusion scan and chest x-ray adequate for interpretation during COVID 19 epidemic. RADIOPHARMACEUTICALS:  4.4 mCi Tc-85m MAA IV COMPARISON:  Chest CT 4:34 p.m. FINDINGS: Bilateral lower lobe volume loss corresponds to atelectasis noted on accompanying chest CT. No focal perfusion defects are identified no extra pulmonary radiotracer uptake identified. IMPRESSION: No scintigraphic evidence of acute pulmonary embolism. Electronically Signed   By: Helyn Numbers M.D.   On: 10/03/2023 20:44   DG Chest 2 View Result Date: 09/29/2023 CLINICAL DATA:  Weakness, nausea. EXAM: CHEST - 2 VIEW COMPARISON:  Same day. FINDINGS: Stable cardiomediastinal silhouette. Right internal jugular dialysis catheter is unchanged. Hypoinflation of the lungs is noted with probable bibasilar subsegmental atelectasis and possible small pleural effusions. Bony thorax is unremarkable. IMPRESSION: Hypoinflation of lungs with probable bibasilar subsegmental atelectasis and possible small pleural effusions. Electronically Signed   By: Lupita Raider M.D.   On: 09/29/2023 12:53   DG Chest Portable 1 View Result Date: 09/29/2023 CLINICAL DATA:  ?Pna Pt complains of generalized weakness, nausea, and loss of appetite x 1 week. Pt states he was discharged from SNF around a week ago EXAM: PORTABLE CHEST 1 VIEW COMPARISON:  Chest  x-ray 08/14/2023 FINDINGS: Right chest wall dialysis catheter with tip overlying the right atrium. The heart and mediastinal contours are within normal limits. Low lung volumes. Bibasilar airspace opacities. No pulmonary edema. Likely trace bilateral pleural effusion. No pneumothorax. No acute osseous abnormality. IMPRESSION: 1. Low lung volumes with bibasilar airspace opacities that could represent a combination of atelectasis and/or infection/inflammation. Recommend repeat PA and lateral view with improved inspiratory effort for further evaluation. 2. Likely trace bilateral pleural effusions. Electronically Signed   By: Tish Frederickson M.D.   On: 09/29/2023 09:55    Catarina Hartshorn, DO  [facility]  If 7PM-7AM, please contact night-coverage [email or web address] Password [facility] 10/09/2023, 5:24 PM  LOS: 9 days

## 2023-10-09 NOTE — Progress Notes (Signed)
Patient ID: Philip Benjamin, male   DOB: [birth year], 72 y.o.   MRN: [id number]  S:  Last HD on 12/18 with 1 kg UF.  He states had some GI upset a few days ago when ate fatty food and I advised to avoid those foods given his gallbladder issues.  He asks about the duration of the gallbladder drain and I asked him to speak with his primary team for a better answer on that.  He's not sure of definitive discharge plans yet but per charting possibly to SNF tomorrow  Review of systems:  Denies shortness of breath or chest pain Denies n/v Still with diarrhea - he was embarrassed/upset about this and staff have reassured him   O:BP 132/72 (BP Location: Right Arm)   Pulse 65   Temp 98.3 F (36.8 C)   Resp 18   Ht 6\' 1"  (1.854 m)   Wt 87.6 kg   SpO2 99%   BMI 25.48 kg/m   Intake/Output Summary (Last 24 hours) at 10/09/2023 1011 Last data filed at 10/09/2023 0600 Gross per 24 hour  Intake 733.58 ml  Output 476 ml  Net 257.58 ml   Intake/Output: I/O last 3 completed shifts: In: 903.6 [P.O.:600; I.V.:8.6; IV Piggyback:295] Out: 726 [Urine:600; Drains:125; Other:1]  Intake/Output this shift:  No intake/output data recorded. Weight change:   General adult male in bed in no acute distress   HEENT normocephalic atraumatic extraocular movements intact sclera anicteric Neck supple trachea midline Lungs clear to auscultation bilaterally normal work of breathing at rest  Heart S1S2 no rub Abdomen soft nontender nondistended Extremities no edema  Psych normal mood and affect Access RIJ tunn catheter in use   Recent Labs  Lab 10/04/23 1515 10/05/23 0946 10/06/23 0454 10/07/23 0430 10/08/23 0441 10/08/23 0705 10/09/23 0425  NA 132* 130* 130* 134* 137 136 136  K 3.0* 3.8 3.5 3.6 3.4* 3.5 3.4*  CL 97* 94* 94* 96* 100 100 100  CO2 24 25 24 27 26 25 27   GLUCOSE 85 282* 276* 252* 121* 115* 131*  BUN 67* 45* 53* 34* 37* 37* 18  CREATININE 5.10* 3.86* 4.63* 3.74* 4.37* 4.52* 3.36*   ALBUMIN 2.2*  2.2* 2.2* 2.1* 2.0* 2.1* 2.2* 2.1*  CALCIUM 8.5* 8.3* 8.4* 8.3* 8.6* 8.5* 8.4*  PHOS 3.3  --   --   --   --  3.9  --   AST 47* 48* 41 46* 57*  --  58*  ALT 61* 64* 58* 61* 67*  --  76*   Liver Function Tests: Recent Labs  Lab 10/07/23 0430 10/08/23 0441 10/08/23 0705 10/09/23 0425  AST 46* 57*  --  58*  ALT 61* 67*  --  76*  ALKPHOS 81 76  --  77  BILITOT 0.6 0.4  --  0.6  PROT 5.2* 5.5*  --  5.4*  ALBUMIN 2.0* 2.1* 2.2* 2.1*   No results for input(s): "LIPASE", "AMYLASE" in the last 168 hours. No results for input(s): "AMMONIA" in the last 168 hours. CBC: Recent Labs  Lab 10/06/23 0454 10/07/23 0430 10/08/23 0441 10/08/23 0706 10/09/23 0425  WBC 16.1* 8.4 8.3 8.3 6.8  NEUTROABS  --  6.4 6.0  --  4.7  HGB 9.2* 9.1* 9.3* 9.0* 8.6*  HCT 28.7* 28.8* 28.7* 28.4* 26.5*  MCV 89.7 90.9 91.4 91.9 89.8  PLT 208 202 222 231 232   Cardiac Enzymes: No results for input(s): "CKTOTAL", "CKMB", "CKMBINDEX", "TROPONINI" in the last 168 hours. CBG: Recent  Labs  Lab 10/08/23 1410 10/08/23 1626 10/08/23 2059 10/09/23 0305 10/09/23 0723  GLUCAP 119* 138* 219* 130* 116*    Iron Studies: No results for input(s): "IRON", "TIBC", "TRANSFERRIN", "FERRITIN" in the last 72 hours. Studies/Results: No results found.   calcitRIOL  0.25 mcg Oral Daily   Chlorhexidine Gluconate Cloth  6 each Topical Daily   Chlorhexidine Gluconate Cloth  6 each Topical Q0600   cycloSPORINE  1 drop Both Eyes BID   darbepoetin (ARANESP) injection - DIALYSIS  60 mcg Subcutaneous Q Wed-1800   dorzolamide-timolol  1 drop Both Eyes BID   FLUoxetine  20 mg Oral QHS   folic acid  1,000 mcg Oral Daily   heparin  5,000 Units Subcutaneous Q8H   insulin aspart  0-6 Units Subcutaneous TID WC   insulin aspart  6 Units Subcutaneous TID WC   insulin glargine-yfgn  18 Units Subcutaneous Daily   latanoprost  1 drop Left Eye QHS   multivitamin  1 tablet Oral QHS   sodium chloride flush  3 mL  Intravenous Q12H    BMET    Component Value Date/Time   NA 136 10/09/2023 0425   K 3.4 (L) 10/09/2023 0425   CL 100 10/09/2023 0425   CO2 27 10/09/2023 0425   GLUCOSE 131 (H) 10/09/2023 0425   BUN 18 10/09/2023 0425   CREATININE 3.36 (H) 10/09/2023 0425   CALCIUM 8.4 (L) 10/09/2023 0425   GFRNONAA 19 (L) 10/09/2023 0425   GFRAA 44 (L) 01/14/2018 1112   CBC    Component Value Date/Time   WBC 6.8 10/09/2023 0425   RBC 2.95 (L) 10/09/2023 0425   HGB 8.6 (L) 10/09/2023 0425   HCT 26.5 (L) 10/09/2023 0425   PLT 232 10/09/2023 0425   MCV 89.8 10/09/2023 0425   MCH 29.2 10/09/2023 0425   MCHC 32.5 10/09/2023 0425   RDW 13.1 10/09/2023 0425   LYMPHSABS 1.1 10/09/2023 0425   MONOABS 0.9 10/09/2023 0425   EOSABS 0.1 10/09/2023 0425   BASOSABS 0.0 10/09/2023 0425    Outpatient HD orders: [facility], MWF. 4 hours. EDW 90kg. TDC (has appt coming up with [facility] on 10/07/23 for perm access consideration). Flow rates: 350/500. 2K, 2.5Ca. Heparin: 1000 units/hr. Meds: Mircera every 4 weeks (last dose 09/15/23), Venofer 50mg  every Monday.   Assessment/Plan:   Covid-19 with superimposed bacterial pneumonia - per primary team.  Being treated for concurrent bacterial PNA with abx per primary team.    Diarrhea - c diff neg, GI panel negative.  Supportive care per primary team  Cholecystitis - seen by CT scan with distended gallbladder and pericholecystic stranding and loculated fluid.  S/p Surgery evaluation.  IR placed cholecystostomy tube and drain on 10/06/23.  Abx per primary team.  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 Phos stable.  Disposition - per primary team.  Per charting planned for 12/20 to SNF  Estanislado Emms, MD 10:26 AM 10/09/2023

## 2023-10-09 NOTE — Plan of Care (Signed)
  Problem: Education: Goal: Knowledge of risk factors and measures for prevention of condition will improve Outcome: Progressing   Problem: Coping: Goal: Psychosocial and spiritual needs will be supported Outcome: Progressing   Problem: Respiratory: Goal: Complications related to the disease process, condition or treatment will be avoided or minimized Outcome: Progressing   Problem: Education: Goal: Knowledge of General Education information will improve Description: Including pain rating scale, medication(s)/side effects and non-pharmacologic comfort measures Outcome: Progressing   Problem: Health Behavior/Discharge Planning: Goal: Ability to manage health-related needs will improve Outcome: Progressing   Problem: Clinical Measurements: Goal: Ability to maintain clinical measurements within normal limits will improve Outcome: Progressing Goal: Will remain free from infection Outcome: Progressing Goal: Diagnostic test results will improve Outcome: Progressing Goal: Respiratory complications will improve Outcome: Progressing Goal: Cardiovascular complication will be avoided Outcome: Progressing   Problem: Activity: Goal: Risk for activity intolerance will decrease Outcome: Progressing   Problem: Coping: Goal: Level of anxiety will decrease Outcome: Progressing   Problem: Elimination: Goal: Will not experience complications related to bowel motility Outcome: Progressing Goal: Will not experience complications related to urinary retention Outcome: Progressing   Problem: Pain Management: Goal: General experience of comfort will improve Outcome: Progressing   Problem: Safety: Goal: Ability to remain free from injury will improve Outcome: Progressing   Problem: Skin Integrity: Goal: Risk for impaired skin integrity will decrease Outcome: Progressing   Problem: Education: Goal: Ability to describe self-care measures that may prevent or decrease complications  (Diabetes Survival Skills Education) will improve Outcome: Progressing Goal: Individualized Educational Video(s) Outcome: Progressing   Problem: Coping: Goal: Ability to adjust to condition or change in health will improve Outcome: Progressing   Problem: Fluid Volume: Goal: Ability to maintain a balanced intake and output will improve Outcome: Progressing   Problem: Health Behavior/Discharge Planning: Goal: Ability to identify and utilize available resources and services will improve Outcome: Progressing Goal: Ability to manage health-related needs will improve Outcome: Progressing   Problem: Metabolic: Goal: Ability to maintain appropriate glucose levels will improve Outcome: Progressing   Problem: Nutritional: Goal: Maintenance of adequate nutrition will improve Outcome: Progressing Goal: Progress toward achieving an optimal weight will improve Outcome: Progressing   Problem: Skin Integrity: Goal: Risk for impaired skin integrity will decrease Outcome: Progressing   Problem: Tissue Perfusion: Goal: Adequacy of tissue perfusion will improve Outcome: Progressing

## 2023-10-10 DIAGNOSIS — U071 COVID-19: Secondary | ICD-10-CM | POA: Diagnosis not present

## 2023-10-10 DIAGNOSIS — K810 Acute cholecystitis: Secondary | ICD-10-CM | POA: Diagnosis not present

## 2023-10-10 DIAGNOSIS — N186 End stage renal disease: Secondary | ICD-10-CM | POA: Diagnosis not present

## 2023-10-10 DIAGNOSIS — I10 Essential (primary) hypertension: Secondary | ICD-10-CM | POA: Diagnosis not present

## 2023-10-10 LAB — CBC WITH DIFFERENTIAL/PLATELET
Abs Immature Granulocytes: 0.05 10*3/uL (ref 0.00–0.07)
Basophils Absolute: 0 10*3/uL (ref 0.0–0.1)
Basophils Relative: 0 %
Eosinophils Absolute: 0.2 10*3/uL (ref 0.0–0.5)
Eosinophils Relative: 3 %
HCT: 28.5 % — ABNORMAL LOW (ref 39.0–52.0)
Hemoglobin: 8.7 g/dL — ABNORMAL LOW (ref 13.0–17.0)
Immature Granulocytes: 1 %
Lymphocytes Relative: 19 %
Lymphs Abs: 1.5 10*3/uL (ref 0.7–4.0)
MCH: 28.7 pg (ref 26.0–34.0)
MCHC: 30.5 g/dL (ref 30.0–36.0)
MCV: 94.1 fL (ref 80.0–100.0)
Monocytes Absolute: 1.3 10*3/uL — ABNORMAL HIGH (ref 0.1–1.0)
Monocytes Relative: 16 %
Neutro Abs: 4.7 10*3/uL (ref 1.7–7.7)
Neutrophils Relative %: 61 %
Platelets: 213 10*3/uL (ref 150–400)
RBC: 3.03 MIL/uL — ABNORMAL LOW (ref 4.22–5.81)
RDW: 13.2 % (ref 11.5–15.5)
WBC: 7.7 10*3/uL (ref 4.0–10.5)
nRBC: 0 % (ref 0.0–0.2)

## 2023-10-10 LAB — COMPREHENSIVE METABOLIC PANEL
ALT: 59 U/L — ABNORMAL HIGH (ref 0–44)
AST: 35 U/L (ref 15–41)
Albumin: 2.3 g/dL — ABNORMAL LOW (ref 3.5–5.0)
Alkaline Phosphatase: 71 U/L (ref 38–126)
Anion gap: 10 (ref 5–15)
BUN: 23 mg/dL (ref 8–23)
CO2: 25 mmol/L (ref 22–32)
Calcium: 8.6 mg/dL — ABNORMAL LOW (ref 8.9–10.3)
Chloride: 100 mmol/L (ref 98–111)
Creatinine, Ser: 4.11 mg/dL — ABNORMAL HIGH (ref 0.61–1.24)
GFR, Estimated: 15 mL/min — ABNORMAL LOW (ref >60)
Glucose, Bld: 97 mg/dL (ref 70–99)
Potassium: 3.8 mmol/L (ref 3.5–5.1)
Sodium: 135 mmol/L (ref 135–145)
Total Bilirubin: 0.4 mg/dL (ref <1.2)
Total Protein: 5.4 g/dL — ABNORMAL LOW (ref 6.5–8.1)

## 2023-10-10 LAB — GLUCOSE, CAPILLARY
Glucose-Capillary: 93 mg/dL (ref 70–99)
Glucose-Capillary: 84 mg/dL (ref 70–99)
Glucose-Capillary: 105 mg/dL — ABNORMAL HIGH (ref 70–99)

## 2023-10-10 MED ORDER — HEPARIN SODIUM (PORCINE) 1000 UNIT/ML IJ SOLN
INTRAMUSCULAR | Status: AC
  Filled 2023-10-10: qty 4

## 2023-10-10 MED ORDER — PIPERACILLIN SOD-TAZOBACTAM SO 2.25 (2-0.25) G IV SOLR
2.2500 g | Freq: Three times a day (TID) | INTRAVENOUS | Status: DC
  Administered 2023-10-10: 2.25 g via INTRAVENOUS
  Filled 2023-10-10 (×5): qty 10

## 2023-10-10 NOTE — Care Management Important Message (Signed)
Important Message  Patient Details  Name: Philip Benjamin MRN: [id number] Date of Birth: 02-05-1951   Important Message Given:  Yes - Medicare IM     Corey Harold 10/10/2023, 11:06 AM

## 2023-10-10 NOTE — TOC Transition Note (Signed)
Transition of Care [facility]) - Discharge Note   Patient Details  Name: Philip Benjamin MRN: [id number] Date of Birth: 06-May-1951  Transition of Care [facility]) CM/SW Contact:  Elliot Gault, LCSW Phone Number: 10/10/2023, 2:45 PM   Clinical Narrative:     Pt stable for dc and [facility] able to admit today.   DC clinical sent electronically. RN to call report. Transport arranged with [facility].  No other TOC needs for dc.  Final next level of care: Skilled Nursing Facility Barriers to Discharge: Barriers Resolved   Patient Goals and CMS Choice Patient states their goals for this hospitalization and ongoing recovery are:: SNF CMS Medicare.gov Compare Post Acute Care list provided to:: Patient Choice offered to / list presented to : Patient [place] ownership interest in [facility].provided to:: Patient    Discharge Placement              Patient chooses bed at: [facility] Patient to be transferred to facility by: [facility] Name of family member notified: [name] Patient and family notified of of transfer: 10/10/23  Discharge Plan and Services Additional resources added to the After Visit Summary for   In-house Referral: Clinical Social Work   Post Acute Care Choice: Skilled Nursing Facility                    HH Arranged: PT, RN [facility] Agency: [facility] Date [facility] Agency Contacted: 09/30/23 Time HH Agency Contacted: [phone] Representative spoke with at [facility] Agency: Clydie Braun  Social Drivers of Health (SDOH) Interventions SDOH Screenings   Food Insecurity: No Food Insecurity (09/29/2023)  Housing: Patient Declined (09/29/2023)  Transportation Needs: No Transportation Needs (09/29/2023)  Utilities: Not At Risk (09/29/2023)  Tobacco Use: Low Risk  (09/29/2023)  Health Literacy: Medium Risk (09/05/2023)   Received from [facility]     Readmission Risk Interventions    10/01/2023    7:48 AM 08/22/2023   12:48 PM 08/15/2023   12:29 PM   Readmission Risk Prevention Plan  Transportation Screening Complete Complete Complete  Home Care Screening  Complete Complete  Medication Review (RN CM)  Complete Complete  HRI or Home Care Consult Complete    Social Work Consult for Recovery Care Planning/Counseling Complete    Palliative Care Screening Not Applicable    Medication Review Oceanographer) Complete

## 2023-10-10 NOTE — Progress Notes (Addendum)
Patient ID: Philip Benjamin, male   DOB: 01-Jul-1951, 72 y.o.   MRN: [id number]  S:  Seen and examined on dialysis.  Blood pressure 114/78 and HR 82.  Tolerating goal.  RIJ tunn catheter in use.  Procedure supervised.  He feels well and confirms that the plan that he is aware of is to discharge to SNF later today.  Had 400 mL UOP over 12/19 and 2 unmeasured urine voids thus far today.    Review of systems:    Denies shortness of breath or chest pain Denies n/v Diarrhea is getting better  O:BP 97/69   Pulse 72   Temp 98.2 F (36.8 C) (Oral)   Resp 15   Ht 6\' 1"  (1.854 m)   Wt 87.1 kg   SpO2 100%   BMI 25.33 kg/m   Intake/Output Summary (Last 24 hours) at 10/10/2023 1026 Last data filed at 10/10/2023 0549 Gross per 24 hour  Intake --  Output 35 ml  Net -35 ml   Intake/Output: I/O last 3 completed shifts: In: 290 [P.O.:240; IV Piggyback:50] Out: 485 [Urine:400; Drains:85]  Intake/Output this shift:  No intake/output data recorded. Weight change:   General adult male in bed in no acute distress    HEENT normocephalic atraumatic extraocular movements intact sclera anicteric Neck supple trachea midline Lungs clear to auscultation bilaterally normal work of breathing at rest  Heart S1S2 no rub Abdomen soft nontender nondistended Extremities no edema  Psych normal mood and affect Access RIJ tunn catheter in use   Recent Labs  Lab 10/04/23 1515 10/05/23 0946 10/06/23 0454 10/07/23 0430 10/08/23 0441 10/08/23 0705 10/09/23 0425 10/10/23 0414  NA 132* 130* 130* 134* 137 136 136 135  K 3.0* 3.8 3.5 3.6 3.4* 3.5 3.4* 3.8  CL 97* 94* 94* 96* 100 100 100 100  CO2 24 25 24 27 26 25 27 25   GLUCOSE 85 282* 276* 252* 121* 115* 131* 97  BUN 67* 45* 53* 34* 37* 37* 18 23  CREATININE 5.10* 3.86* 4.63* 3.74* 4.37* 4.52* 3.36* 4.11*  ALBUMIN 2.2*  2.2* 2.2* 2.1* 2.0* 2.1* 2.2* 2.1* 2.3*  CALCIUM 8.5* 8.3* 8.4* 8.3* 8.6* 8.5* 8.4* 8.6*  PHOS 3.3  --   --   --   --  3.9  --    --   AST 47* 48* 41 46* 57*  --  58* 35  ALT 61* 64* 58* 61* 67*  --  76* 59*   Liver Function Tests: Recent Labs  Lab 10/08/23 0441 10/08/23 0705 10/09/23 0425 10/10/23 0414  AST 57*  --  58* 35  ALT 67*  --  76* 59*  ALKPHOS 76  --  77 71  BILITOT 0.4  --  0.6 0.4  PROT 5.5*  --  5.4* 5.4*  ALBUMIN 2.1* 2.2* 2.1* 2.3*   No results for input(s): "LIPASE", "AMYLASE" in the last 168 hours. No results for input(s): "AMMONIA" in the last 168 hours. CBC: Recent Labs  Lab 10/07/23 0430 10/08/23 0441 10/08/23 0706 10/09/23 0425 10/10/23 0414  WBC 8.4 8.3 8.3 6.8 7.7  NEUTROABS 6.4 6.0  --  4.7 4.7  HGB 9.1* 9.3* 9.0* 8.6* 8.7*  HCT 28.8* 28.7* 28.4* 26.5* 28.5*  MCV 90.9 91.4 91.9 89.8 94.1  PLT 202 222 231 232 213   Cardiac Enzymes: No results for input(s): "CKTOTAL", "CKMB", "CKMBINDEX", "TROPONINI" in the last 168 hours. CBG: Recent Labs  Lab 10/09/23 1159 10/09/23 1654 10/09/23 2103 10/10/23 0327 10/10/23 9562  GLUCAP 268* 223* 166* 93 84    Iron Studies: No results for input(s): "IRON", "TIBC", "TRANSFERRIN", "FERRITIN" in the last 72 hours. Studies/Results: No results found.   calcitRIOL  0.25 mcg Oral Daily   Chlorhexidine Gluconate Cloth  6 each Topical Daily   Chlorhexidine Gluconate Cloth  6 each Topical Q0600   Chlorhexidine Gluconate Cloth  6 each Topical Q0600   cycloSPORINE  1 drop Both Eyes BID   darbepoetin (ARANESP) injection - DIALYSIS  60 mcg Subcutaneous Q Wed-1800   dorzolamide-timolol  1 drop Both Eyes BID   FLUoxetine  20 mg Oral QHS   folic acid  1,000 mcg Oral Daily   heparin  5,000 Units Subcutaneous Q8H   insulin aspart  0-6 Units Subcutaneous TID WC   insulin aspart  6 Units Subcutaneous TID WC   insulin glargine-yfgn  18 Units Subcutaneous Daily   latanoprost  1 drop Left Eye QHS   multivitamin  1 tablet Oral QHS   sodium chloride flush  3 mL Intravenous Q12H    BMET    Component Value Date/Time   NA 135 10/10/2023 0414    K 3.8 10/10/2023 0414   CL 100 10/10/2023 0414   CO2 25 10/10/2023 0414   GLUCOSE 97 10/10/2023 0414   BUN 23 10/10/2023 0414   CREATININE 4.11 (H) 10/10/2023 0414   CALCIUM 8.6 (L) 10/10/2023 0414   GFRNONAA 15 (L) 10/10/2023 0414   GFRAA 44 (L) 01/14/2018 1112   CBC    Component Value Date/Time   WBC 7.7 10/10/2023 0414   RBC 3.03 (L) 10/10/2023 0414   HGB 8.7 (L) 10/10/2023 0414   HCT 28.5 (L) 10/10/2023 0414   PLT 213 10/10/2023 0414   MCV 94.1 10/10/2023 0414   MCH 28.7 10/10/2023 0414   MCHC 30.5 10/10/2023 0414   RDW 13.2 10/10/2023 0414   LYMPHSABS 1.5 10/10/2023 0414   MONOABS 1.3 (H) 10/10/2023 0414   EOSABS 0.2 10/10/2023 0414   BASOSABS 0.0 10/10/2023 0414    Outpatient HD orders: [facility], MWF. 4 hours. EDW 90kg. TDC (has appt coming up with [facility] on 10/07/23 for perm access consideration). Flow rates: 350/500. 2K, 2.5Ca. Heparin: 1000 units/hr. Meds: Mircera every 4 weeks (last dose 09/15/23), Venofer 50mg  every Monday.   Assessment/Plan:   Covid-19 with superimposed bacterial pneumonia - per primary team.  He was treated for concurrent bacterial PNA with abx per primary team.  Abx complete as of 12/20 per charting Diarrhea - c diff neg, GI panel negative.  Supportive care per primary team  Cholecystitis - seen by CT scan with distended gallbladder and pericholecystic stranding and loculated fluid.  S/p Surgery evaluation.  IR placed cholecystostomy tube and drain on 10/06/23.  Abx per primary team.  Per charting no further abx after 12/20 ESRD - HD per MWF schedule.  He has lost weight with this illness.  Consider 87.5 kg as a new EDW.  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 Phos stable last check Disposition - per primary team.  Per charting planned for 12/20 to SNF  Estanislado Emms, MD 10:26 AM 10/10/2023   Addendum:  HD RN is  going to call [facility] with his new EDW  Estanislado Emms, MD 10:42 AM 10/10/2023

## 2023-10-10 NOTE — Plan of Care (Signed)
  Problem: Education: Goal: Knowledge of risk factors and measures for prevention of condition will improve Outcome: Progressing   Problem: Coping: Goal: Psychosocial and spiritual needs will be supported Outcome: Progressing   Problem: Respiratory: Goal: Complications related to the disease process, condition or treatment will be avoided or minimized Outcome: Progressing   Problem: Education: Goal: Knowledge of General Education information will improve Description: Including pain rating scale, medication(s)/side effects and non-pharmacologic comfort measures Outcome: Progressing   Problem: Health Behavior/Discharge Planning: Goal: Ability to manage health-related needs will improve Outcome: Progressing   Problem: Clinical Measurements: Goal: Ability to maintain clinical measurements within normal limits will improve Outcome: Progressing Goal: Will remain free from infection Outcome: Progressing Goal: Diagnostic test results will improve Outcome: Progressing Goal: Respiratory complications will improve Outcome: Progressing Goal: Cardiovascular complication will be avoided Outcome: Progressing   Problem: Activity: Goal: Risk for activity intolerance will decrease Outcome: Progressing   Problem: Coping: Goal: Level of anxiety will decrease Outcome: Progressing   Problem: Elimination: Goal: Will not experience complications related to bowel motility Outcome: Progressing Goal: Will not experience complications related to urinary retention Outcome: Progressing   Problem: Pain Management: Goal: General experience of comfort will improve Outcome: Progressing   Problem: Safety: Goal: Ability to remain free from injury will improve Outcome: Progressing   Problem: Skin Integrity: Goal: Risk for impaired skin integrity will decrease Outcome: Progressing   Problem: Education: Goal: Ability to describe self-care measures that may prevent or decrease complications  (Diabetes Survival Skills Education) will improve Outcome: Progressing Goal: Individualized Educational Video(s) Outcome: Progressing   Problem: Coping: Goal: Ability to adjust to condition or change in health will improve Outcome: Progressing   Problem: Fluid Volume: Goal: Ability to maintain a balanced intake and output will improve Outcome: Progressing   Problem: Health Behavior/Discharge Planning: Goal: Ability to identify and utilize available resources and services will improve Outcome: Progressing Goal: Ability to manage health-related needs will improve Outcome: Progressing   Problem: Metabolic: Goal: Ability to maintain appropriate glucose levels will improve Outcome: Progressing   Problem: Nutritional: Goal: Maintenance of adequate nutrition will improve Outcome: Progressing Goal: Progress toward achieving an optimal weight will improve Outcome: Progressing   Problem: Skin Integrity: Goal: Risk for impaired skin integrity will decrease Outcome: Progressing   Problem: Tissue Perfusion: Goal: Adequacy of tissue perfusion will improve Outcome: Progressing

## 2023-10-10 NOTE — Progress Notes (Signed)
  HEMODIALYSIS TREATMENT NOTE:  Uneventful 3.25 hour heparin-free treatment completed using right internal jugular TDC.  Goal was adjusted as SBP dropped <100.  Net UF 700 ml.  All blood was returned.  Post-HD:  10/10/23 1300  Vitals  Temp 98 F (36.7 C)  Temp Source Oral  BP 125/74  MAP (mmHg) 89  BP Location Right Arm  BP Method Automatic  Patient Position (if appropriate) Lying  Pulse Rate 69  Pulse Rate Source Monitor  Resp 18  Oxygen Therapy  SpO2 100 %  O2 Device Room Air  Post Treatment  Dialyzer Clearance Lightly streaked  Hemodialysis Intake (mL) 0 mL  Liters Processed 69.7  Fluid Removed (mL) 700 mL  Tolerated HD Treatment Yes  Post-Hemodialysis Comments Adjusted goal was met  Hemodialysis Catheter Right Subclavian Double lumen Permanent (Tunneled)  Placement Date/Time: 08/15/23 0820   Serial / Lot #: [id number]  Expiration Date: 04/27/28  Time Out: Correct patient;Correct site;Correct procedure  Maximum sterile barrier precautions: Hand hygiene;Cap;Mask;Sterile gown;Sterile gloves;Large sterile ...  Site Condition No complications  Blue Lumen Status Flushed;Heparin locked;Dead end cap in place  Red Lumen Status Flushed;Heparin locked;Dead end cap in place  Purple Lumen Status N/A  Catheter fill volume (Arterial) 1.9 cc  Catheter fill volume (Venous) 1.9  Dressing Type Transparent;Tube stabilization device  Dressing Status Antimicrobial disc in place;Clean, Dry, Intact  Interventions New dressing  Drainage Description None  Dressing Change Due 10/17/23  Post treatment catheter status Capped and Clamped    Attempted call to [facility] four times without success.  Unable to communicate today's post-weight to clinic.  I wrote "post-weight  10/10/23 was 86.7 kg" on a piece of tape and attached it to pt's catheter dressing.  Will continue to try to call clinic today.  Arman Filter, RN AP [facility]

## 2023-10-10 NOTE — Discharge Summary (Signed)
Physician Discharge Summary   Patient: Philip Benjamin MRN: [id number] DOB: 08-01-1951  Admit date:     09/29/2023  Discharge date: 10/10/23  Discharge Physician: Onalee Hua Shalunda Lindh   PCP: Kirstie Peri, MD   Recommendations at discharge:   Please follow up with primary care provider within 1-2 weeks  Please repeat CMP and CBC in one week     Hospital Course: 72 year old male with a history of hypertension, diabetes mellitus, hyperlipidemia, newly diagnosed ESRD, and anemia of CKD  The patient was recently hospitalized from 08/14/2023-08/20/2023 where he was declared new ESRD and initiated on dialysis. TDC was placed on 08/15/2023, and the patient was initiated on HD.    Pt is admitted secondary to general malaise, nausea and weakness; patient has also expressed intermittent nonproductive coughing spells and mild shortness of breath.  Patient denies chills, fever, overt bleeding, focal weaknesses, headache or any other complaints.   Of note, patient has been released from the skilled nursing facility about 1 week prior to this hospitalization.   Workup in the ED demonstrated positive COVID-19 infection, mild dehydration and hypokalemia.   TRH has been consulted to place in the hospital for further evaluation and management.  The patient also had a recent hospital admission from 08/21/2023 to 08/25/2023 when he was treated for sepsis secondary to Pseudomonas UTI and lobar pneumonia.  He was treated with IV cefepime, and discharged with Cipro for 2 additional doses.  The patient was placed on intravenous steroids for his COVID 19 infection.  His PCT was 2.05.  As result, the patient was initially started on ceftriaxone and azithromycin for presumptive superimposed pneumonia.  He gradually improved, but WBC increased.  UA suggested significant pyuria with WBC>50.  His ceftriaxone was switched to cefepime given his previous Pseudomonas in the urine.  The patient remained afebrile and hemodynamically  stable.  Unfortunately, he began developing right upper quadrant abdominal pain with increase in WBC despite being on antibiotics.  CT of the abdomen and pelvis was performed and showed distended gallbladder with pericholecystic stranding and loculated pericholecystic fluid.  There was concern for pericholecystic abscess.  General surgery was consulted.  IR was also subsequently consulted for percutaneous cholecystotomy tube and drainage of the abscess.  Assessment and Plan: COVID 19 Pneumonia - treated  -stable on RA -presented with intermittent nonproductive coughing spells and just mild short winded sensation with activity. -treated with supportive care, bronchodilators and steroids -CRP 32.9>>9.6>>6.1 -Patient was started on steroids the time of admission>>last dose completed 10/02/23 -isolation cleared from covid standpoint to go to SNF on Friday 12/20.    bacterial pneumonia - TREATED  -Patient with elevated leukocytosis and also elevated procalcitonin -PCT 2.05 -initially ceftriaxone and azithro -Continue mucolytic's, flutter valve and bronchodilator as mentioned above -switched over to zosyn by surgery for anaerobic coverage for gallbladder issue -stable on RA without leukocytosis   Acute Cholecystitis with concern for abscess -10/03/2023 CT abdomen--distended gallbladder with pericholecystic stranding and loculated pericholecystic fluid in the right subhepatic space -Appreciate General Surgery, case discussed with Dr. Robyne Peers -Patient remains afebrile hemodynamically stable with minimal right upper quadrant pain -s/p IR cholecystotomy tube placement with drainage of abscess on 12/16 -complete Zosyn on Friday when discharging to SNF so no need to discharge on further antibiotics per surgery  - he will need to follow up with Dr. Robyne Peers outpatient to arrange for cholecystectomy -IR will follow up with patient regarding the drains -leukocytosis resolved   ESRD -Appreciate  nephrology managing inpatient HD -regular outpatient  treatments on MWF -last HD on 12/20   Pyuria/UTI -sent urine culture 12/11--multiple organisms -remains on IV zosyn with plan to stop it right before DC to SNF on Friday 12/20 -received 6 days of zosyn   Diabetes mellitus type 2 -08/14/2023 hemoglobin A1c 5.6 -Continue NovoLog sliding scale -increased Semglee 18 units; added novolog 6 units TID with meals -sensitive sliding scale; CBG 5x per day  Hypokalemia -Repleted -Continue to follow electrolytes -correction on HD   hypertension/hyperlipidemia -Continue statins and metoprolol -12/14--now hold statin with mild LFT elevation   BPH -Continue Flomax   increased intraocular pressure/glaucoma -Continue the use of latanoprost and also dorzolamide/timolol. -Continue outpatient follow-up with ophthalmology service.   depression/anxiety -Continue as needed Xanax and the use of Prozac.   generalized weakness -Most likely in the setting of pneumonia/COVID -PT evaluation>>SNF with plan to DC on Fri 12/20 due to Covid isolation period   anemia of chronic kidney disease -Stable hemoglobin - Hgb stable in 9 range, no bleeding. On ESA per nephrology  -Hgb 8.7 on day of d/c   Diarrhea -c diff neg -check stool pathogen panel--pending -prn loperamide       Consultants: renal. General surgery Procedures performed: cholecystotomy tube 12/16 Disposition: Skilled nursing facility Diet recommendation:  Renal diet DISCHARGE MEDICATION: Allergies as of 10/10/2023   No Known Allergies      Medication List     STOP taking these medications    losartan 100 MG tablet Commonly known as: COZAAR   metoprolol tartrate 25 MG tablet Commonly known as: LOPRESSOR   pravastatin 80 MG tablet Commonly known as: PRAVACHOL       TAKE these medications    acetaminophen 650 MG CR tablet Commonly known as: TYLENOL Take 650 mg by mouth every 6 (six) hours as needed for  pain.   ALPRAZolam 0.25 MG tablet Commonly known as: XANAX Take 1 tablet (0.25 mg total) by mouth 3 (three) times daily as needed for anxiety.   calcitRIOL 0.25 MCG capsule Commonly known as: ROCALTROL Take 0.25 mcg by mouth daily.   Cholecalciferol 125 MCG (5000 UT) capsule Take by mouth.   cycloSPORINE 0.05 % ophthalmic emulsion Commonly known as: RESTASIS SMARTSIG:In Eye(s)   Dorzolamide HCl-Timolol Mal PF 2-0.5 % Soln Apply 1 drop to eye 2 (two) times daily.   FLUoxetine 20 MG/5ML solution Commonly known as: PROZAC Take 5 mLs by mouth daily.   folic acid 800 MCG tablet Commonly known as: FOLVITE Take 800 mcg by mouth daily.   Lantus 100 UNIT/ML injection Generic drug: insulin glargine Inject 26 Units into the skin daily.   latanoprost 0.005 % ophthalmic solution Commonly known as: XALATAN Place 1 drop into both eyes at bedtime. What changed: how to take this   multivitamin Tabs tablet Take 1 tablet by mouth at bedtime.   nystatin cream Commonly known as: MYCOSTATIN Apply 1 application topically 2 (two) times daily as needed. FOR YEAST OR SKIN RASHES.   tamsulosin 0.4 MG Caps capsule Commonly known as: FLOMAX Take 0.4 mg by mouth daily with supper.        Contact information for follow-up providers     [facility] Follow up.   Specialty: Radiology Why: Schedulers will contact you with follow-up appointment with Radiology for drain evaluation. Contact information: [address] [place] [address] [phone]        Pappayliou, Gustavus Messing, DO. Call.   Specialty: General Surgery Why: Call to schedule follow-up appointment with me after  follow-up with radiology Contact information: [address] [place] [facility] [address] [phone]              Contact information for after-discharge care     Destination     [facility] Preferred SNF .   Service: Skilled Nursing Contact  information: [address] [place] [address] [phone]                    Discharge Exam: Ceasar Mons Weights   10/08/23 5956 10/08/23 1308 10/10/23 0920  Weight: 88.6 kg 87.6 kg 87.1 kg   HEENT:  [place]/AT, No thrush, no icterus CV:  RRR, no rub, no S3, no S4 Lung:  CTA, no wheeze, no rhonchi Abd:  soft/+BS, NT Ext:  No edema, no lymphangitis, no synovitis, no rash   Condition at discharge: stable  The results of significant diagnostics from this hospitalization (including imaging, microbiology, ancillary and laboratory) are listed below for reference.   Imaging Studies: IR Perc Cholecystostomy Result Date: 10/06/2023 INDICATION: 72 year old male with acute perforated calculus cholecystitis and Peri cholecystic fluid collection concerning for abscess versus biloma. He presents for percutaneous cholecystostomy tube placement and aspiration versus drainage of the secondary fluid collection as well. EXAM: CHOLECYSTOSTOMY; ABSCESS DRAINAGE MEDICATIONS: In patient currently receiving intravenous antibiotics. No additional prophylaxis administered. ANESTHESIA/SEDATION: Moderate (conscious) sedation was employed during this procedure. A total of Versed 2 mg and Fentanyl 100 mcg was administered intravenously by the Radiology nurse. Moderate Sedation Time: 20 minutes. The patient's level of consciousness and vital signs were monitored continuously by radiology nursing throughout the procedure under my direct supervision. FLUOROSCOPY TIME:  Radiation exposure index: 30 mGy reference air kerma COMPLICATIONS: None immediate. PROCEDURE: Informed written consent was obtained from the patient after a thorough discussion of the procedural risks, benefits and alternatives. All questions were addressed. Maximal Sterile Barrier Technique was utilized including caps, mask, sterile gowns, sterile gloves, sterile drape, hand hygiene and skin antiseptic. A timeout was performed prior to the  initiation of the procedure. The right upper quadrant was interrogated with ultrasound. A complex fluid collection is identified in the Peri cholecystic space. This appears enlarged compared to the most recent CT scan. The gallbladder is distended and demonstrates wall thickening. A suitable skin entry site for the percutaneous cholecystostomy tube was selected an local anesthesia was attained by infiltration with 1% lidocaine. A small dermatotomy was made. Under real-time ultrasound guidance, a 22 gauge Accustick needle was advanced along a short transhepatic course and into the gallbladder lumen. A 0.018 wire was then coiled in the gallbladder lumen. The needle was removed. The transitional dilator was advanced over the wire and into the gallbladder. Aspiration yields black bile which was sent for Gram stain and culture. A gentle injection of contrast material confirms that the transitional dilator is within the gallbladder lumen. A 0.035 wire was coiled in the gallbladder lumen. The transitional dilator was removed. The percutaneous tract was dilated to 10 [place]. A Cook 10 [place] all-purpose drainage catheter was advanced over the wire and formed. The catheter was connected to gravity bag drainage and secured to the skin with 0 Prolene suture. Attention was next turned to the complex pericholecystic fluid collection. A second suitable skin entry site was selected and marked. Local anesthesia was again attained by infiltration with 1% lidocaine. A small dermatotomy was made. Again, under real-time ultrasound guidance, an 18 gauge trocar needle was advanced into the fluid collection. A 0.035 wire was coiled in the  fluid collection. The tract was dilated to 10 [place]. A 10 French drainage catheter was advanced over the wire and formed. Aspiration yields approximately 60 mL of turbid bilious fluid. The catheter was connected to JP bulb suction. IMPRESSION: 1. Successful placement of a transhepatic percutaneous  cholecystostomy tube for the treatment of acute calculus cholecystitis. 2. Placement of a 10 French drainage catheter into the pericholecystic fluid collection with evacuation of bilious fluid. Electronically Signed   By: Malachy Moan M.D.   On: 10/06/2023 18:38   IR US ABSCESS DRAIN PLACEMENT Result Date: 10/06/2023 INDICATION: 72 year old male with acute perforated calculus cholecystitis and Peri cholecystic fluid collection concerning for abscess versus biloma. He presents for percutaneous cholecystostomy tube placement and aspiration versus drainage of the secondary fluid collection as well. EXAM: CHOLECYSTOSTOMY; ABSCESS DRAINAGE MEDICATIONS: In patient currently receiving intravenous antibiotics. No additional prophylaxis administered. ANESTHESIA/SEDATION: Moderate (conscious) sedation was employed during this procedure. A total of Versed 2 mg and Fentanyl 100 mcg was administered intravenously by the Radiology nurse. Moderate Sedation Time: 20 minutes. The patient's level of consciousness and vital signs were monitored continuously by radiology nursing throughout the procedure under my direct supervision. FLUOROSCOPY TIME:  Radiation exposure index: 30 mGy reference air kerma COMPLICATIONS: None immediate. PROCEDURE: Informed written consent was obtained from the patient after a thorough discussion of the procedural risks, benefits and alternatives. All questions were addressed. Maximal Sterile Barrier Technique was utilized including caps, mask, sterile gowns, sterile gloves, sterile drape, hand hygiene and skin antiseptic. A timeout was performed prior to the initiation of the procedure. The right upper quadrant was interrogated with ultrasound. A complex fluid collection is identified in the Peri cholecystic space. This appears enlarged compared to the most recent CT scan. The gallbladder is distended and demonstrates wall thickening. A suitable skin entry site for the percutaneous cholecystostomy  tube was selected an local anesthesia was attained by infiltration with 1% lidocaine. A small dermatotomy was made. Under real-time ultrasound guidance, a 22 gauge Accustick needle was advanced along a short transhepatic course and into the gallbladder lumen. A 0.018 wire was then coiled in the gallbladder lumen. The needle was removed. The transitional dilator was advanced over the wire and into the gallbladder. Aspiration yields black bile which was sent for Gram stain and culture. A gentle injection of contrast material confirms that the transitional dilator is within the gallbladder lumen. A 0.035 wire was coiled in the gallbladder lumen. The transitional dilator was removed. The percutaneous tract was dilated to 10 [place]. A Cook 10 [place] all-purpose drainage catheter was advanced over the wire and formed. The catheter was connected to gravity bag drainage and secured to the skin with 0 Prolene suture. Attention was next turned to the complex pericholecystic fluid collection. A second suitable skin entry site was selected and marked. Local anesthesia was again attained by infiltration with 1% lidocaine. A small dermatotomy was made. Again, under real-time ultrasound guidance, an 18 gauge trocar needle was advanced into the fluid collection. A 0.035 wire was coiled in the fluid collection. The tract was dilated to 10 [place]. A 10 French drainage catheter was advanced over the wire and formed. Aspiration yields approximately 60 mL of turbid bilious fluid. The catheter was connected to JP bulb suction. IMPRESSION: 1. Successful placement of a transhepatic percutaneous cholecystostomy tube for the treatment of acute calculus cholecystitis. 2. Placement of a 10 French drainage catheter into the pericholecystic fluid collection with evacuation of bilious fluid. Electronically Signed   By: Vilma Prader  Archer Asa M.D.   On: 10/06/2023 18:38   CT CHEST ABDOMEN PELVIS WO CONTRAST Result Date: 10/04/2023 CLINICAL DATA:   Upper abdominal pain, weakness, COVID pneumonia EXAM: CT CHEST, ABDOMEN AND PELVIS WITHOUT CONTRAST TECHNIQUE: Multidetector CT imaging of the chest, abdomen and pelvis was performed following the standard protocol without IV contrast. RADIATION DOSE REDUCTION: This exam was performed according to the departmental dose-optimization program which includes automated exposure control, adjustment of the mA and/or kV according to patient size and/or use of iterative reconstruction technique. COMPARISON:  08/21/2023 FINDINGS: CT CHEST FINDINGS Cardiovascular: Extensive multi-vessel coronary artery calcification. Global cardiac size within normal limits. No pericardial effusion. Central pulmonary arteries are of normal caliber. Thoracic aorta is unremarkable. Right internal jugular hemodialysis catheter tip noted within the low right atrium at the hepatocaval junction. Mediastinum/Nodes: No enlarged mediastinal, hilar, or axillary lymph nodes. Thyroid gland, trachea, and esophagus demonstrate no significant findings. Lungs/Pleura: Left lower lobe chronic consolidation and volume loss is again identified with traction bronchiectasis. Platelike atelectasis has developed within the right lower lobe with marked volume loss. Traction bronchiectasis is noted suggesting some degree fibrotic change. Similar, though milder changes are noted within the basilar right middle lobe. No superimposed confluent pulmonary infiltrate. No pneumothorax or pleural effusion. Musculoskeletal: No chest wall mass or suspicious bone lesions identified. CT ABDOMEN PELVIS FINDINGS Hepatobiliary: The gallbladder is distended and there is pericholecystic inflammatory stranding in keeping with changes of acute cholecystitis. There is loculated pericholecystic fluid seen within the right subhepatic space along the peritoneal wall, new since prior examination. Gallbladder perforation and pericholecystic abscess formation is not excluded. This measures 5.5  x 2.1 cm at axial image # 72/5. The liver is unremarkable. No intra or extrahepatic biliary ductal dilation. Pancreas: Unremarkable Spleen: Unremarkable Adrenals/Urinary Tract: Adrenal glands are unremarkable. Kidneys are normal, without renal calculi, focal lesion, or hydronephrosis. Bladder is unremarkable. Stomach/Bowel: Stomach is within normal limits. Appendix appears normal. No evidence of bowel wall thickening, distention, or inflammatory changes. Mild sigmoid diverticulosis. Vascular/Lymphatic: Aortic atherosclerosis. No enlarged abdominal or pelvic lymph nodes. Reproductive: Mild prostatic hypertrophy Other: Tiny fat containing umbilical hernia. No abdominopelvic ascites. Musculoskeletal: Osseous structures are age-appropriate. No acute bone abnormality. IMPRESSION: 1. Acute cholecystitis. Loculated pericholecystic fluid within the right subhepatic space along the peritoneal wall, new since prior examination. Gallbladder perforation and pericholecystic abscess formation is not excluded. Correlation with liver enzymes is recommended. 2. Extensive multi-vessel coronary artery calcification. 3. Chronic left lower lobe consolidation and volume loss with traction bronchiectasis. Interval development of platelike atelectasis within the right lower lobe with marked volume loss. Similar, though milder changes are noted within the basilar right middle lobe. 4. Mild sigmoid diverticulosis. 5. Mild prostatic hypertrophy. Aortic Atherosclerosis (ICD10-I70.0). Electronically Signed   By: Helyn Numbers M.D.   On: 10/04/2023 00:08   NM Pulmonary Perfusion Result Date: 10/03/2023 CLINICAL DATA:  Chest pain, dyspnea, COVID-19 pneumonia EXAM: NUCLEAR MEDICINE PERFUSION LUNG SCAN TECHNIQUE: Perfusion images were obtained in multiple projections after intravenous injection of radiopharmaceutical. Ventilation scans intentionally deferred if perfusion scan and chest x-ray adequate for interpretation during COVID 19  epidemic. RADIOPHARMACEUTICALS:  4.4 mCi Tc-56m MAA IV COMPARISON:  Chest CT 4:34 p.m. FINDINGS: Bilateral lower lobe volume loss corresponds to atelectasis noted on accompanying chest CT. No focal perfusion defects are identified no extra pulmonary radiotracer uptake identified. IMPRESSION: No scintigraphic evidence of acute pulmonary embolism. Electronically Signed   By: Helyn Numbers M.D.   On: 10/03/2023 20:44   DG Chest 2 View Result  Date: 09/29/2023 CLINICAL DATA:  Weakness, nausea. EXAM: CHEST - 2 VIEW COMPARISON:  Same day. FINDINGS: Stable cardiomediastinal silhouette. Right internal jugular dialysis catheter is unchanged. Hypoinflation of the lungs is noted with probable bibasilar subsegmental atelectasis and possible small pleural effusions. Bony thorax is unremarkable. IMPRESSION: Hypoinflation of lungs with probable bibasilar subsegmental atelectasis and possible small pleural effusions. Electronically Signed   By: Lupita Raider M.D.   On: 09/29/2023 12:53   DG Chest Portable 1 View Result Date: 09/29/2023 CLINICAL DATA:  ?Pna Pt complains of generalized weakness, nausea, and loss of appetite x 1 week. Pt states he was discharged from SNF around a week ago EXAM: PORTABLE CHEST 1 VIEW COMPARISON:  Chest x-ray 08/14/2023 FINDINGS: Right chest wall dialysis catheter with tip overlying the right atrium. The heart and mediastinal contours are within normal limits. Low lung volumes. Bibasilar airspace opacities. No pulmonary edema. Likely trace bilateral pleural effusion. No pneumothorax. No acute osseous abnormality. IMPRESSION: 1. Low lung volumes with bibasilar airspace opacities that could represent a combination of atelectasis and/or infection/inflammation. Recommend repeat PA and lateral view with improved inspiratory effort for further evaluation. 2. Likely trace bilateral pleural effusions. Electronically Signed   By: Tish Frederickson M.D.   On: 09/29/2023 09:55    Microbiology: Results  for orders placed or performed during the hospital encounter of 09/29/23  Resp panel by RT-PCR (RSV, Flu A&B, Covid) Anterior Nasal Swab     Status: Abnormal   Collection Time: 09/29/23 10:05 AM   Specimen: Anterior Nasal Swab  Result Value Ref Range Status   SARS Coronavirus 2 by RT PCR POSITIVE (A) NEGATIVE Final    Comment: (NOTE) SARS-CoV-2 target nucleic acids are DETECTED.  The SARS-CoV-2 RNA is generally detectable in upper respiratory specimens during the acute phase of infection. Positive results are indicative of the presence of the identified virus, but do not rule out bacterial infection or co-infection with other pathogens not detected by the test. Clinical correlation with patient history and other diagnostic information is necessary to determine patient infection status. The expected result is Negative.  Fact Sheet for Patients: BloggerCourse.com  Fact Sheet for Healthcare Providers: SeriousBroker.it  This test is not yet approved or cleared by the [place] FDA and  has been authorized for detection and/or diagnosis of SARS-CoV-2 by FDA under an Emergency Use Authorization (EUA).  This EUA will remain in effect (meaning this test can be used) for the duration of  the COVID-19 declaration under Section 564(b)(1) of the A ct, 21 U.S.C. section 360bbb-3(b)(1), unless the authorization is terminated or revoked sooner.     Influenza A by PCR NEGATIVE NEGATIVE Final   Influenza B by PCR NEGATIVE NEGATIVE Final    Comment: (NOTE) The Xpert Xpress SARS-CoV-2/FLU/RSV plus assay is intended as an aid in the diagnosis of influenza from Nasopharyngeal swab specimens and should not be used as a sole basis for treatment. Nasal washings and aspirates are unacceptable for Xpert Xpress SARS-CoV-2/FLU/RSV testing.  Fact Sheet for Patients: BloggerCourse.com  Fact Sheet for Healthcare  Providers: SeriousBroker.it  This test is not yet approved or cleared by the [place] FDA and has been authorized for detection and/or diagnosis of SARS-CoV-2 by FDA under an Emergency Use Authorization (EUA). This EUA will remain in effect (meaning this test can be used) for the duration of the COVID-19 declaration under Section 564(b)(1) of the Act, 21 U.S.C. section 360bbb-3(b)(1), unless the authorization is terminated or revoked.     Resp Syncytial  Virus by PCR NEGATIVE NEGATIVE Final    Comment: (NOTE) Fact Sheet for Patients: BloggerCourse.com  Fact Sheet for Healthcare Providers: SeriousBroker.it  This test is not yet approved or cleared by the [place] FDA and has been authorized for detection and/or diagnosis of SARS-CoV-2 by FDA under an Emergency Use Authorization (EUA). This EUA will remain in effect (meaning this test can be used) for the duration of the COVID-19 declaration under Section 564(b)(1) of the Act, 21 U.S.C. section 360bbb-3(b)(1), unless the authorization is terminated or revoked.  Performed at [facility], [address], [place], [place] [address]   Urine Culture (for pregnant, neutropenic or urologic patients or patients with an indwelling urinary catheter)     Status: Abnormal   Collection Time: 09/29/23  3:24 PM   Specimen: Urine, Clean Catch  Result Value Ref Range Status   Specimen Description   Final    URINE, CLEAN CATCH Performed at [facility], [address], [place], [place] [address]    Special Requests   Final    NONE Performed at [facility], [address] [place], [place] [address]    Culture MULTIPLE SPECIES PRESENT, SUGGEST RECOLLECTION (A)  Final   Report Status 10/02/2023 FINAL  Final  C Difficile Quick Screen w PCR reflex     Status: None   Collection Time: 10/01/23 11:15 AM   Specimen: STOOL  Result Value Ref Range Status   C Diff  antigen NEGATIVE NEGATIVE Final   C Diff toxin NEGATIVE NEGATIVE Final   C Diff interpretation No C. difficile detected.  Final    Comment: Performed at [facility], [address], [place], [place] [address]  Gastrointestinal Panel by PCR , Stool     Status: None   Collection Time: 10/02/23 10:15 AM   Specimen: Stool  Result Value Ref Range Status   Campylobacter species NOT DETECTED NOT DETECTED Final   Plesimonas shigelloides NOT DETECTED NOT DETECTED Final   Salmonella species NOT DETECTED NOT DETECTED Final   Yersinia enterocolitica NOT DETECTED NOT DETECTED Final   Vibrio species NOT DETECTED NOT DETECTED Final   Vibrio cholerae NOT DETECTED NOT DETECTED Final   Enteroaggregative E coli (EAEC) NOT DETECTED NOT DETECTED Final   Enteropathogenic E coli (EPEC) NOT DETECTED NOT DETECTED Final   Enterotoxigenic E coli (ETEC) NOT DETECTED NOT DETECTED Final   Shiga like toxin producing E coli (STEC) NOT DETECTED NOT DETECTED Final   Shigella/Enteroinvasive E coli (EIEC) NOT DETECTED NOT DETECTED Final   Cryptosporidium NOT DETECTED NOT DETECTED Final   Cyclospora cayetanensis NOT DETECTED NOT DETECTED Final   Entamoeba histolytica NOT DETECTED NOT DETECTED Final   Giardia lamblia NOT DETECTED NOT DETECTED Final   Adenovirus F40/41 NOT DETECTED NOT DETECTED Final   Astrovirus NOT DETECTED NOT DETECTED Final   Norovirus GI/GII NOT DETECTED NOT DETECTED Final   Rotavirus A NOT DETECTED NOT DETECTED Final   Sapovirus (I, II, IV, and V) NOT DETECTED NOT DETECTED Final    Comment: Performed at [facility], [address] [place], [place] [address]  Culture, blood (Routine X 2) w Reflex to ID Panel     Status: None   Collection Time: 10/03/23  2:37 PM   Specimen: BLOOD  Result Value Ref Range Status   Specimen Description BLOOD RIGHT ANTECUBITAL  Final   Special Requests   Final    Blood Culture results may not be optimal due to an inadequate volume of blood received in  culture  bottles BOTTLES DRAWN AEROBIC ONLY   Culture   Final    NO GROWTH 5 DAYS Performed at [facility], [address] [place], [place] [address]    Report Status 10/08/2023 FINAL  Final  Culture, blood (Routine X 2) w Reflex to ID Panel     Status: None   Collection Time: 10/03/23  2:45 PM   Specimen: BLOOD  Result Value Ref Range Status   Specimen Description BLOOD BLOOD LEFT HAND  Final   Special Requests   Final    Blood Culture results may not be optimal due to an inadequate volume of blood received in culture bottles BOTTLES DRAWN AEROBIC ONLY   Culture   Final    NO GROWTH 5 DAYS Performed at [facility], [address] [place], [place] [address]    Report Status 10/08/2023 FINAL  Final  Aerobic/Anaerobic Culture w Gram Stain (surgical/deep wound)     Status: None (Preliminary result)   Collection Time: 10/06/23  3:32 PM   Specimen: BILE  Result Value Ref Range Status   Specimen Description   Final    BILE Performed at [facility], [address], [place], [place] [address]    Special Requests   Final    Normal Performed at [facility], [address] [place], [place] [address]    Gram Stain NO WBC SEEN NO ORGANISMS SEEN   Final   Culture   Final    NO GROWTH 4 DAYS NO ANAEROBES ISOLATED; CULTURE IN PROGRESS FOR 5 DAYS Performed at [facility], [address] [place], [place] [address]    Report Status PENDING  Incomplete    Labs: CBC: Recent Labs  Lab 10/07/23 0430 10/08/23 0441 10/08/23 0706 10/09/23 0425 10/10/23 0414  WBC 8.4 8.3 8.3 6.8 7.7  NEUTROABS 6.4 6.0  --  4.7 4.7  HGB 9.1* 9.3* 9.0* 8.6* 8.7*  HCT 28.8* 28.7* 28.4* 26.5* 28.5*  MCV 90.9 91.4 91.9 89.8 94.1  PLT 202 222 231 232 213   Basic Metabolic Panel: Recent Labs  Lab 10/04/23 1515 10/05/23 0946 10/07/23 0430 10/08/23 0441 10/08/23 0705 10/09/23 0425 10/10/23 0414  NA 132*   < > 134* 137 136 136 135  K 3.0*   < > 3.6 3.4* 3.5 3.4* 3.8  CL 97*   < > 96* 100 100  100 100  CO2 24   < > 27 26 25 27 25   GLUCOSE 85   < > 252* 121* 115* 131* 97  BUN 67*   < > 34* 37* 37* 18 23  CREATININE 5.10*   < > 3.74* 4.37* 4.52* 3.36* 4.11*  CALCIUM 8.5*   < > 8.3* 8.6* 8.5* 8.4* 8.6*  MG  --   --  1.9  --   --   --   --   PHOS 3.3  --   --   --  3.9  --   --    < > = values in this interval not displayed.   Liver Function Tests: Recent Labs  Lab 10/06/23 0454 10/07/23 0430 10/08/23 0441 10/08/23 0705 10/09/23 0425 10/10/23 0414  AST 41 46* 57*  --  58* 35  ALT 58* 61* 67*  --  76* 59*  ALKPHOS 90 81 76  --  77 71  BILITOT 0.8 0.6 0.4  --  0.6 0.4  PROT 5.4* 5.2* 5.5*  --  5.4* 5.4*  ALBUMIN 2.1* 2.0* 2.1* 2.2* 2.1* 2.3*   CBG:  Recent Labs  Lab 10/09/23 1159 10/09/23 1654 10/09/23 2103 10/10/23 0327 10/10/23 0731  GLUCAP 268* 223* 166* 93 84    Discharge time spent: greater than 30 minutes.  Signed: Catarina Hartshorn, MD [facility] 10/10/2023

## 2023-10-11 LAB — AEROBIC/ANAEROBIC CULTURE W GRAM STAIN (SURGICAL/DEEP WOUND)
Culture: NO GROWTH
Gram Stain: NONE SEEN
Special Requests: NORMAL

## 2023-10-17 ENCOUNTER — Other Ambulatory Visit: Payer: Self-pay

## 2023-10-17 ENCOUNTER — Emergency Department (HOSPITAL_BASED_OUTPATIENT_CLINIC_OR_DEPARTMENT_OTHER)
Admission: EM | Admit: 2023-10-17 | Discharge: 2023-10-17 | Disposition: A | Discharge status: Home / Self Care | Payer: Medicare Other | Attending: Emergency Medicine | Admitting: Emergency Medicine

## 2023-10-17 ENCOUNTER — Encounter (HOSPITAL_BASED_OUTPATIENT_CLINIC_OR_DEPARTMENT_OTHER): Payer: Self-pay | Admitting: Emergency Medicine

## 2023-10-17 DIAGNOSIS — H538 Other visual disturbances: Secondary | ICD-10-CM | POA: Insufficient documentation

## 2023-10-17 DIAGNOSIS — H539 Unspecified visual disturbance: Secondary | ICD-10-CM

## 2023-10-17 NOTE — ED Triage Notes (Signed)
Patient reports intermittent vision loss for the last couple of days. Patient states he is seeing "dark spots". Hx of glaucoma. Has appts with retina specialist but was unable to go d/t hospitalizations.

## 2023-10-17 NOTE — ED Provider Notes (Signed)
[place] EMERGENCY DEPARTMENT AT [facility] Provider Note   CSN: [id number] Arrival date & time: 10/17/23  1304     History  Chief Complaint  Patient presents with   Visual Field Change    Philip Benjamin is a 72 y.o. male.  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  Patient states he had glaucoma surgery in his right eye 1 year ago.  He states since then he has had "fuzzy" vision.  He states over the past several months he has episodes of darkened vision in which she wakes up and cannot tell that the lights are.  He currently does not have those symptoms.  He also has chronic peripheral field changes.  He follows with [facility].  He had appointment with the retinal specialist but was unable to attend his appointment on 10/03/2023 because he was admitted to the hospital for gallbladder complications.  The history is provided by the patient. No language interpreter was used.       Home Medications Prior to Admission medications   Medication Sig Start Date End Date Taking? Authorizing Provider  acetaminophen (TYLENOL) 650 MG CR tablet Take 650 mg by mouth every 6 (six) hours as needed for pain.    [provider]  ALPRAZolam Prudy Feeler) 0.25 MG tablet Take 1 tablet (0.25 mg total) by mouth 3 (three) times daily as needed for anxiety. 10/09/23   Catarina Hartshorn, MD  calcitRIOL (ROCALTROL) 0.25 MCG capsule Take 0.25 mcg by mouth daily.    [provider]  Cholecalciferol 125 MCG (5000 UT) capsule Take by mouth.    [provider]  cycloSPORINE (RESTASIS) 0.05 % ophthalmic emulsion SMARTSIG:In Eye(s) 08/13/23   [provider]  Dorzolamide HCl-Timolol Mal PF 2-0.5 % SOLN Apply 1 drop to eye 2 (two) times daily. 08/18/23   [provider]  FLUoxetine (PROZAC) 20 MG/5ML solution Take 5 mLs by mouth daily. 09/17/23 10/17/23  [provider]  folic acid  (FOLVITE) 800 MCG tablet Take 800 mcg by mouth daily.    [provider]  LANTUS 100 UNIT/ML injection Inject 26 Units into the skin daily.    [provider]  latanoprost (XALATAN) 0.005 % ophthalmic solution Place 1 drop into both eyes at bedtime. Patient taking differently: Place 1 drop into the left eye at bedtime. 08/25/23   Catarina Hartshorn, MD  multivitamin (RENA-VIT) TABS tablet Take 1 tablet by mouth at bedtime. 08/20/23   Catarina Hartshorn, MD  nystatin cream (MYCOSTATIN) Apply 1 application topically 2 (two) times daily as needed. FOR YEAST OR SKIN RASHES. 12/16/17   [provider]  tamsulosin (FLOMAX) 0.4 MG CAPS capsule Take 0.4 mg by mouth daily with supper. 12/16/17   [provider]      Allergies    Patient has no known allergies.    Review of Systems   Review of Systems  Constitutional:  Negative for chills and fever.  HENT:  Negative for ear pain and sore throat.   Eyes:  Positive for visual disturbance. Negative for pain.  Respiratory:  Negative for cough and shortness of breath.   Cardiovascular:  Negative for chest pain and palpitations.  Gastrointestinal:  Negative for abdominal pain and vomiting.  Genitourinary:  Negative for dysuria and hematuria.  Musculoskeletal:  Negative for arthralgias and back pain.  Skin:  Negative for color change and rash.  Neurological:  Negative for seizures and  syncope.  All other systems reviewed and are negative.   Physical Exam Updated Vital Signs BP 129/81   Pulse 79   Temp 98.9 F (37.2 C)   Resp 15   Ht 6\' 1"  (1.854 m)   Wt 90.7 kg   SpO2 100%   BMI 26.39 kg/m  Physical Exam Vitals and nursing note reviewed.  Constitutional:      General: He is not in acute distress.    Appearance: He is well-developed.  HENT:     Head: Normocephalic and atraumatic.  Eyes:     General: Lids are normal. Vision grossly intact. Visual field deficit present.     Extraocular Movements: Extraocular movements  intact.     Conjunctiva/sclera: Conjunctivae normal.     Pupils: Pupils are equal, round, and reactive to light.     Comments: Able to finger count in bilateral eyes approximately 5 feet from the patient.  Peripheral field deficits in bilateral eyes.  Cardiovascular:     Rate and Rhythm: Normal rate and regular rhythm.     Heart sounds: No murmur heard. Pulmonary:     Effort: Pulmonary effort is normal. No respiratory distress.     Breath sounds: Normal breath sounds.  Abdominal:     Palpations: Abdomen is soft.     Tenderness: There is no abdominal tenderness.  Musculoskeletal:        General: No swelling.     Cervical back: Neck supple.  Skin:    General: Skin is warm and dry.     Capillary Refill: Capillary refill takes less than 2 seconds.  Neurological:     Mental Status: He is alert.  Psychiatric:        Mood and Affect: Mood normal.     ED Results / Procedures / Treatments   Labs (all labs ordered are listed, but only abnormal results are displayed) Labs Reviewed - No data to display  EKG None  Radiology No results found.  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  Patient is alert and oriented x 3, no acute distress, afebrile, stable vital signs.  Physical exam demonstrates intact gross vision. Able to finger count in bilateral eyes approximately 5 feet from the patient.  Peripheral field deficits in bilateral eyes.  PERRLA.  Extraocular movement intact.  Patient does not currently have any vision changes.  I spoke with our neurology team who states it does not sound like amaurosis fugax due to its repetitive, recurrent-nature.  Consult placed to ophthalmology team to see if patient needs to be seen more emergently for  likely retinal detachment.  I did do a bedside ultrasound of the orbit and did not see any gross retinal detachments.  Patient otherwise neurovascularly intact.  No strokelike symptoms.  No reply after 2 pages.  Patient does follow with [facility].  Office is currently closed.  I recommend they call first thing Monday morning for rescheduling of his appointment with the retinal specialist.  Patient in no distress and overall condition improved here in the ED. Detailed discussions were had with the patient regarding current  findings, and need for close f/u with PCP or on call doctor. The patient has been instructed to return immediately if the symptoms worsen in any way for re-evaluation. Patient verbalized understanding and is in agreement with current care plan. All questions answered prior to discharge.         Final Clinical Impression(s) / ED Diagnoses Final diagnoses:  Vision changes    Rx / DC Orders ED Discharge Orders     None         Franne Forts, DO 10/17/23 1658

## 2023-10-29 ENCOUNTER — Other Ambulatory Visit (HOSPITAL_COMMUNITY): Payer: Self-pay | Admitting: Student

## 2023-10-29 DIAGNOSIS — K810 Acute cholecystitis: Secondary | ICD-10-CM

## 2023-11-04 ENCOUNTER — Encounter: Payer: Medicare Other | Admitting: Vascular Surgery

## 2023-11-06 ENCOUNTER — Inpatient Hospital Stay: Payer: Medicare Other | Admitting: Oncology

## 2023-11-06 ENCOUNTER — Inpatient Hospital Stay: Payer: Medicare Other

## 2023-11-11 ENCOUNTER — Inpatient Hospital Stay: Payer: Medicare Other

## 2023-11-11 ENCOUNTER — Inpatient Hospital Stay: Payer: Medicare Other | Admitting: Hematology

## 2023-11-11 NOTE — Progress Notes (Incomplete)
[facility] [address], [place] [address]   Clinic Day:  11/11/2023  Referring physician: Kirstie Peri, MD  Patient Care Team: Kirstie Peri, MD as PCP - General (Internal Medicine)   ASSESSMENT & PLAN:   Assessment: ***  Plan: ***  No orders of the defined types were placed in this encounter.     Alben Deeds Teague,acting as a Neurosurgeon for Doreatha Massed, MD.,have documented all relevant documentation on the behalf of Doreatha Massed, MD,as directed by  Doreatha Massed, MD while in the presence of Doreatha Massed, MD.   ***  Lissa Hoard R Teague   1/21/20258:51 AM  CHIEF COMPLAINT/PURPOSE OF CONSULT:   Diagnosis: ***  Current Therapy:  ***  HISTORY OF PRESENT ILLNESS:   Philip Benjamin is a 73 y.o. male presenting to clinic today for evaluation of anemia at the request of Kolluru, Sarath, MD.  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   Today, he states that he is doing well overall. His appetite level is at ***%. His energy level is at ***%.  He was found to have abnormal CBC from 10/17/23 with low RBC at 2.97, low HGB at 8.8, low HCT at 27.8, low MCHC at 31.7, elevated RDW at 15.2, and elevated MPV at 11.5.   PAST MEDICAL HISTORY:   Past Medical History: Past Medical History:  Diagnosis Date   BPH (benign prostatic hyperplasia)    Chronic kidney disease    ckd stage 3   Diabetes mellitus without complication (HCC)    Hypertension     Surgical History: Past Surgical History:  Procedure Laterality Date   EYE SURGERY     INGUINAL HERNIA REPAIR Bilateral 01/23/2018   Procedure: BILATERAL OPEN INGUINAL HERNIA REPAIR WITH MESH;  Surgeon: Jimmye Norman, MD;  Location[facility] OR;  Service: General;  Laterality: Bilateral;   INSERTION OF MESH Bilateral 01/23/2018   Procedure: INSERTION OF MESH;  Surgeon: Jimmye Norman, MD;  Location: [facility] OR;  Service: General;  Laterality:  Bilateral;   IR FLUORO GUIDE CV LINE RIGHT  08/15/2023   IR PERC CHOLECYSTOSTOMY  10/06/2023   IR US GUIDE BX ASP/DRAIN  10/06/2023   IR US GUIDE VASC ACCESS RIGHT  08/15/2023   VASECTOMY     ~1985    Social History: Social History   Socioeconomic History   Marital status: Married    Spouse name: Not on file   Number of children: Not on file   Years of education: Not on file   Highest education level: Not on file  Occupational History   Not on file  Tobacco Use   Smoking status: Never   Smokeless tobacco: Never  Substance and Sexual Activity   Alcohol use: Never   Drug use: Never   Sexual activity: Not on file  Other Topics Concern   Not on file  Social History Narrative   Not on file   Social Drivers of Health   Financial Resource Strain: Not on file  Food Insecurity: No Food Insecurity (09/29/2023)   Hunger Vital Sign    Worried About Running Out of Food in the Last Year: Never true    Ran Out of Food in the Last Year: Never true  Transportation Needs: No Transportation Needs (10/17/2023)   Received from [facility]   PRAPARE - Transportation    Lack of Transportation (Medical): No    Lack of Transportation (Non-Medical): No  Physical Activity: Not on  file  Stress: Not on file  Social Connections: Not on file  Intimate Partner Violence: Not At Risk (09/29/2023)   Humiliation, Afraid, Rape, and Kick questionnaire    Fear of Current or Ex-Partner: No    Emotionally Abused: No    Physically Abused: No    Sexually Abused: No    Family History: No family history on file.  Current Medications:  Current Outpatient Medications:    acetaminophen (TYLENOL) 650 MG CR tablet, Take 650 mg by mouth every 6 (six) hours as needed for pain., Disp: , Rfl:    ALPRAZolam (XANAX) 0.25 MG tablet, Take 1 tablet (0.25 mg total) by mouth 3 (three) times daily as needed for anxiety., Disp: 10 tablet, Rfl: 0   calcitRIOL (ROCALTROL) 0.25 MCG capsule, Take 0.25 mcg by mouth  daily., Disp: , Rfl:    Cholecalciferol 125 MCG (5000 UT) capsule, Take by mouth., Disp: , Rfl:    cycloSPORINE (RESTASIS) 0.05 % ophthalmic emulsion, SMARTSIG:In Eye(s), Disp: , Rfl:    Dorzolamide HCl-Timolol Mal PF 2-0.5 % SOLN, Apply 1 drop to eye 2 (two) times daily., Disp: , Rfl:    folic acid (FOLVITE) 800 MCG tablet, Take 800 mcg by mouth daily., Disp: , Rfl:    LANTUS 100 UNIT/ML injection, Inject 26 Units into the skin daily., Disp: , Rfl:    latanoprost (XALATAN) 0.005 % ophthalmic solution, Place 1 drop into both eyes at bedtime. (Patient taking differently: Place 1 drop into the left eye at bedtime.), Disp: , Rfl:    multivitamin (RENA-VIT) TABS tablet, Take 1 tablet by mouth at bedtime., Disp: , Rfl:    nystatin cream (MYCOSTATIN), Apply 1 application topically 2 (two) times daily as needed. FOR YEAST OR SKIN RASHES., Disp: , Rfl: 2   tamsulosin (FLOMAX) 0.4 MG CAPS capsule, Take 0.4 mg by mouth daily with supper., Disp: , Rfl: 2   Allergies: No Known Allergies  REVIEW OF SYSTEMS:   Review of Systems  Constitutional:  Negative for chills, fatigue and fever.  HENT:   Negative for lump/mass, mouth sores, nosebleeds, sore throat and trouble swallowing.   Eyes:  Negative for eye problems.  Respiratory:  Negative for cough and shortness of breath.   Cardiovascular:  Negative for chest pain, leg swelling and palpitations.  Gastrointestinal:  Negative for abdominal pain, constipation, diarrhea, nausea and vomiting.  Genitourinary:  Negative for bladder incontinence, difficulty urinating, dysuria, frequency, hematuria and nocturia.   Musculoskeletal:  Negative for arthralgias, back pain, flank pain, myalgias and neck pain.  Skin:  Negative for itching and rash.  Neurological:  Negative for dizziness, headaches and numbness.  Hematological:  Does not bruise/bleed easily.  Psychiatric/Behavioral:  Negative for depression, sleep disturbance and suicidal ideas. The patient is not  nervous/anxious.   All other systems reviewed and are negative.    VITALS:   There were no vitals taken for this visit.  Wt Readings from Last 3 Encounters:  10/17/23 200 lb (90.7 kg)  10/10/23 191 lb 2.2 oz (86.7 kg)  08/25/23 205 lb 0.4 oz (93 kg)    There is no height or weight on file to calculate BMI.   PHYSICAL EXAM:   Physical Exam Vitals and nursing note reviewed. Exam conducted with a chaperone present.  Constitutional:      Appearance: Normal appearance.  Cardiovascular:     Rate and Rhythm: Normal rate and regular rhythm.     Pulses: Normal pulses.     Heart sounds: Normal heart sounds.  Pulmonary:     Effort: Pulmonary effort is normal.     Breath sounds: Normal breath sounds.  Abdominal:     Palpations: Abdomen is soft. There is no hepatomegaly, splenomegaly or mass.     Tenderness: There is no abdominal tenderness.  Musculoskeletal:     Right lower leg: No edema.     Left lower leg: No edema.  Lymphadenopathy:     Cervical: No cervical adenopathy.     Right cervical: No superficial, deep or posterior cervical adenopathy.    Left cervical: No superficial, deep or posterior cervical adenopathy.     Upper Body:     Right upper body: No supraclavicular or axillary adenopathy.     Left upper body: No supraclavicular or axillary adenopathy.  Neurological:     General: No focal deficit present.     Mental Status: He is alert and oriented to person, place, and time.  Psychiatric:        Mood and Affect: Mood normal.        Behavior: Behavior normal.     LABS:   CBC    Component Value Date/Time   WBC 7.7 10/10/2023 0414   RBC 3.03 (L) 10/10/2023 0414   HGB 8.7 (L) 10/10/2023 0414   HCT 28.5 (L) 10/10/2023 0414   PLT 213 10/10/2023 0414   MCV 94.1 10/10/2023 0414   MCH 28.7 10/10/2023 0414   MCHC 30.5 10/10/2023 0414   RDW 13.2 10/10/2023 0414   LYMPHSABS 1.5 10/10/2023 0414   MONOABS 1.3 (H) 10/10/2023 0414   EOSABS 0.2 10/10/2023 0414    BASOSABS 0.0 10/10/2023 0414    CMP    Component Value Date/Time   NA 135 10/10/2023 0414   K 3.8 10/10/2023 0414   CL 100 10/10/2023 0414   CO2 25 10/10/2023 0414   GLUCOSE 97 10/10/2023 0414   BUN 23 10/10/2023 0414   CREATININE 4.11 (H) 10/10/2023 0414   CALCIUM 8.6 (L) 10/10/2023 0414   PROT 5.4 (L) 10/10/2023 0414   ALBUMIN 2.3 (L) 10/10/2023 0414   AST 35 10/10/2023 0414   ALT 59 (H) 10/10/2023 0414   ALKPHOS 71 10/10/2023 0414   BILITOT 0.4 10/10/2023 0414   GFRNONAA 15 (L) 10/10/2023 0414   GFRAA 44 (L) 01/14/2018 1112    No results found for: "CEA1", "CEA" / No results found for: "CEA1", "CEA" No results found for: "PSA1" No results found for: "AVW098" No results found for: "CAN125"  No results found for: "TOTALPROTELP", "ALBUMINELP", "A1GS", "A2GS", "BETS", "BETA2SER", "GAMS", "MSPIKE", "SPEI" Lab Results  Component Value Date   TIBC 289 08/15/2023   FERRITIN 579 (H) 10/04/2023   FERRITIN 523 (H) 10/03/2023   FERRITIN 664 (H) 10/02/2023   IRONPCTSAT 30 08/15/2023   No results found for: "LDH"   STUDIES:   No results found.

## 2023-11-14 ENCOUNTER — Ambulatory Visit (HOSPITAL_COMMUNITY): Payer: Medicare Other

## 2023-11-18 ENCOUNTER — Ambulatory Visit (HOSPITAL_COMMUNITY)
Admission: RE | Admit: 2023-11-18 | Discharge: 2023-11-18 | Disposition: A | Discharge status: Home / Self Care | Payer: Medicare Other | Source: Ambulatory Visit | Attending: Student | Admitting: Student

## 2023-11-18 ENCOUNTER — Encounter (HOSPITAL_COMMUNITY): Payer: Self-pay | Admitting: Radiology

## 2023-11-18 ENCOUNTER — Other Ambulatory Visit (HOSPITAL_COMMUNITY): Payer: Self-pay | Admitting: Student

## 2023-11-18 DIAGNOSIS — N186 End stage renal disease: Secondary | ICD-10-CM

## 2023-11-18 DIAGNOSIS — J9811 Atelectasis: Secondary | ICD-10-CM | POA: Diagnosis not present

## 2023-11-18 DIAGNOSIS — R531 Weakness: Secondary | ICD-10-CM

## 2023-11-18 DIAGNOSIS — U071 COVID-19: Secondary | ICD-10-CM

## 2023-11-18 DIAGNOSIS — K810 Acute cholecystitis: Secondary | ICD-10-CM | POA: Insufficient documentation

## 2023-11-18 DIAGNOSIS — I700 Atherosclerosis of aorta: Secondary | ICD-10-CM | POA: Diagnosis not present

## 2023-11-18 DIAGNOSIS — I2510 Atherosclerotic heart disease of native coronary artery without angina pectoris: Secondary | ICD-10-CM | POA: Diagnosis not present

## 2023-11-18 HISTORY — PX: IR PATIENT EVAL TECH 0-60 MINS: IMG5564

## 2023-11-18 NOTE — Procedures (Signed)
See PA note.

## 2023-11-18 NOTE — Progress Notes (Signed)
Referring Physician(s): Kira Hartl Sue-Ellen  Patient status:  Public librarian Complaint: The patient is seen in follow up today s/p acute cholecysitits, perihepatic abscess  History of present illness: Philip Benjamin is a 73 y.o. male with a past medical history significant BPH, DM, ESRD on HD, HTN, bilateral hearing loss, blindness who presented to [facility] ED 09/29/23 with complaints of fatigue, weakness, nausea, cough and dyspnea. He was found to be COVID-19 positive in the ED with likely super imposed bacterial pneumonia and was placed on IV steroids and abx. Later during this admission he began to experience RUQ pain and worsening leukocytosis. A CT abd/pelvis w/o contrast was obtained which showed a perihepatic abscess in the setting of acute cholecystitis with perforation.  He underwent percutaneous cholecystostomy tube placement as well as perihepatic drain placement.  He presents to [facility] today for follow-up and drain management.    Patient presents without perihepatic drain today.  He states "they took it out a while ago because it wasn't doing anything."  His gall bladder drain is present today with minimal drainage in gravity bag.  He reports no fever, chills, abdominal pain, nausea, vomiting.  He has been feeling well, and is eating and drinking with tolerance.    Past Medical History:  Diagnosis Date   BPH (benign prostatic hyperplasia)    Chronic kidney disease    ckd stage 3   Diabetes mellitus without complication (HCC)    Hypertension     Past Surgical History:  Procedure Laterality Date   EYE SURGERY     INGUINAL HERNIA REPAIR Bilateral 01/23/2018   Procedure: BILATERAL OPEN INGUINAL HERNIA REPAIR WITH MESH;  Surgeon: Jimmye Norman, MD;  Location: [facility] OR;  Service: General;  Laterality: Bilateral;   INSERTION OF MESH Bilateral 01/23/2018   Procedure: INSERTION OF MESH;  Surgeon: Jimmye Norman, MD;  Location: [facility] OR;  Service: General;  Laterality: Bilateral;    IR FLUORO GUIDE CV LINE RIGHT  08/15/2023   IR PATIENT EVAL TECH 0-60 MINS  11/18/2023   IR PERC CHOLECYSTOSTOMY  10/06/2023   IR US GUIDE BX ASP/DRAIN  10/06/2023   IR US GUIDE VASC ACCESS RIGHT  08/15/2023   VASECTOMY     ~1985    Allergies: Patient has no known allergies.  Medications: Prior to Admission medications   Medication Sig Start Date End Date Taking? Authorizing Provider  acetaminophen (TYLENOL) 650 MG CR tablet Take 650 mg by mouth every 6 (six) hours as needed for pain.    [provider]  ALPRAZolam Prudy Feeler) 0.25 MG tablet Take 1 tablet (0.25 mg total) by mouth 3 (three) times daily as needed for anxiety. 10/09/23   Catarina Hartshorn, MD  calcitRIOL (ROCALTROL) 0.25 MCG capsule Take 0.25 mcg by mouth daily.    [provider]  Cholecalciferol 125 MCG (5000 UT) capsule Take by mouth.    [provider]  cycloSPORINE (RESTASIS) 0.05 % ophthalmic emulsion SMARTSIG:In Eye(s) 08/13/23   [provider]  Dorzolamide HCl-Timolol Mal PF 2-0.5 % SOLN Apply 1 drop to eye 2 (two) times daily. 08/18/23   [provider]  folic acid (FOLVITE) 800 MCG tablet Take 800 mcg by mouth daily.    [provider]  LANTUS 100 UNIT/ML injection Inject 26 Units into the skin daily.    [provider]  latanoprost (XALATAN) 0.005 % ophthalmic solution Place 1 drop into both eyes at bedtime. Patient taking differently: Place 1 drop into the left eye at bedtime. 08/25/23  Catarina Hartshorn, MD  multivitamin (RENA-VIT) TABS tablet Take 1 tablet by mouth at bedtime. 08/20/23   Catarina Hartshorn, MD  nystatin cream (MYCOSTATIN) Apply 1 application topically 2 (two) times daily as needed. FOR YEAST OR SKIN RASHES. 12/16/17   [provider]  tamsulosin (FLOMAX) 0.4 MG CAPS capsule Take 0.4 mg by mouth daily with supper. 12/16/17   [provider]     No family history on file.  Social History   Socioeconomic History   Marital status:  Married    Spouse name: Not on file   Number of children: Not on file   Years of education: Not on file   Highest education level: Not on file  Occupational History   Not on file  Tobacco Use   Smoking status: Never   Smokeless tobacco: Never  Substance and Sexual Activity   Alcohol use: Never   Drug use: Never   Sexual activity: Not on file  Other Topics Concern   Not on file  Social History Narrative   Not on file   Social Drivers of Health   Financial Resource Strain: Not on file  Food Insecurity: No Food Insecurity (09/29/2023)   Hunger Vital Sign    Worried About Running Out of Food in the Last Year: Never true    Ran Out of Food in the Last Year: Never true  Transportation Needs: No Transportation Needs (10/17/2023)   Received from [facility]   PRAPARE - Transportation    Lack of Transportation (Medical): No    Lack of Transportation (Non-Medical): No  Physical Activity: Not on file  Stress: Not on file  Social Connections: Not on file     Vital Signs: There were no vitals taken for this visit.  Physical Exam Vitals and nursing note reviewed.  Constitutional:      Appearance: Normal appearance.  Neurological:     Mental Status: He is alert.   Abdomen:  soft, non-tender.  RUQ drain in place.  Insertion site clean and dry.  Skin suture remains intact.  Gravity bag with trace amount of dark output.   Imaging: IR PATIENT EVAL TECH 0-60 MINS Result Date: 11/18/2023 Jacqualine Code     11/18/2023 10:52 AM See PA note.    Labs:  CBC: Recent Labs    10/08/23 0441 10/08/23 0706 10/09/23 0425 10/10/23 0414  WBC 8.3 8.3 6.8 7.7  HGB 9.3* 9.0* 8.6* 8.7*  HCT 28.7* 28.4* 26.5* 28.5*  PLT 222 231 232 213    COAGS: Recent Labs    10/05/23 0904  INR 1.2    BMP: Recent Labs    10/08/23 0441 10/08/23 0705 10/09/23 0425 10/10/23 0414  NA 137 136 136 135  K 3.4* 3.5 3.4* 3.8  CL 100 100 100 100  CO2 26 25 27 25   GLUCOSE 121* 115* 131* 97   BUN 37* 37* 18 23  CALCIUM 8.6* 8.5* 8.4* 8.6*  CREATININE 4.37* 4.52* 3.36* 4.11*  GFRNONAA 14* 13* 19* 15*    LIVER FUNCTION TESTS: Recent Labs    10/07/23 0430 10/08/23 0441 10/08/23 0705 10/09/23 0425 10/10/23 0414  BILITOT 0.6 0.4  --  0.6 0.4  AST 46* 57*  --  58* 35  ALT 61* 67*  --  76* 59*  ALKPHOS 81 76  --  77 71  PROT 5.2* 5.5*  --  5.4* 5.4*  ALBUMIN 2.0* 2.1* 2.2* 2.1* 2.3*    Assessment: Perihepatic abscess in the setting of perforated  acute cholecystitis s/p abscess drain and percutaneous cholecystostomy tube placement Patient presents without perihepatic drain today.  CT obtained prior to IR eval reviewed by Dr. Elby Showers and shows resolve of his perihepatic collection.  CT also shows that the gall bladder drain is retracted and no longer within the lumen.   Discussed with patient.  Again he confirmed he is asymptomatic and is not getting much drainage from his perc chole.   Drain removed in its entirety without complication.   Dressing placed.  Encouraged site care PRN until healed.  No further follow-up needed with IR at this time.   Reviewed symptoms of acute cholecystitis with patient.  Encouraged him to seek evaluation if he develops concerning constellation of symptoms such as fever, chills, nausea, abdominal pain.   Signed: Hoyt Koch, PA 11/18/2023, 12:57 PM     I spent a total of 25 Minutes in face to face in clinical consultation, greater than 50% of which was counseling/coordinating care for acute cholecystitis.

## 2023-11-20 ENCOUNTER — Encounter: Payer: Self-pay | Admitting: Oncology

## 2023-11-20 ENCOUNTER — Inpatient Hospital Stay: Payer: Medicare Other | Attending: Oncology | Admitting: Oncology

## 2023-11-20 ENCOUNTER — Inpatient Hospital Stay: Payer: Medicare Other

## 2023-11-20 VITALS — BP 119/75 | HR 82 | Temp 98.2°F | Resp 18 | Wt 194.0 lb

## 2023-11-20 DIAGNOSIS — Z79899 Other long term (current) drug therapy: Secondary | ICD-10-CM | POA: Insufficient documentation

## 2023-11-20 DIAGNOSIS — N186 End stage renal disease: Secondary | ICD-10-CM | POA: Insufficient documentation

## 2023-11-20 DIAGNOSIS — D631 Anemia in chronic kidney disease: Secondary | ICD-10-CM | POA: Diagnosis not present

## 2023-11-20 DIAGNOSIS — Z992 Dependence on renal dialysis: Secondary | ICD-10-CM | POA: Diagnosis not present

## 2023-11-20 NOTE — Assessment & Plan Note (Signed)
Patient was recently started on dialysis at [facility] in [place]. -Continue to follow with nephrology

## 2023-11-20 NOTE — Assessment & Plan Note (Signed)
Anemia secondary to CKD with hemoglobin of 8.7. Iron levels are adequate and patient is receiving iron during dialysis. Discussed the role of erythropoietin (EPO) in red blood cell production and the need for exogenous EPO in CKD patients. -Plan to start EPO injections pending insurance approval. -Check hemoglobin levels before each injection. If hemoglobin is less than 10, administer EPO. -Inform dialysis center about the plan to start EPO injections.  Return to clinic in 1 month with labs

## 2023-11-20 NOTE — Patient Instructions (Signed)
VISIT SUMMARY:  During today's visit, we discussed your recent health challenges, including your chronic kidney disease, anemia, and the need for colon cancer screening. We also reviewed your current rehabilitation plan following your recent hospitalization.  YOUR PLAN:  -CHRONIC KIDNEY DISEASE (CKD) ON HEMODIALYSIS: Chronic Kidney Disease is a condition where the kidneys are damaged and cannot filter blood as well as they should. You are currently on hemodialysis to help with this. We discussed starting EPO injections to help with your anemia, which is common in CKD patients. We will check your hemoglobin levels before each injection and administer EPO if your hemoglobin is less than 10. We will also inform your dialysis center about this plan.  -ANEMIA SECONDARY TO CKD: Anemia is a condition where you do not have enough red blood cells to carry oxygen throughout your body. This is common in CKD patients. Your hemoglobin level is currently 8.7, and you are receiving iron during dialysis. We discussed the role of EPO in red blood cell production and plan to start EPO injections pending insurance approval.  -COLON CANCER SCREENING: Colon cancer screening is important to detect any early signs of cancer. It is unclear when your last colonoscopy was performed, and you are unsure if it has been more than 10 years. We will consider referring you for a colonoscopy if it has been more than 10 years since your last one.  INSTRUCTIONS:  We will start EPO injections once we get approval from your insurance. Please ensure your hemoglobin levels are checked before each injection. If it has been more than 10 years since your last colonoscopy, we will refer you for one. Continue with your rehabilitation plan to improve your mobility.

## 2023-11-20 NOTE — Progress Notes (Signed)
[facility] at [facility] HEMATOLOGY NEW VISIT  Kirstie Peri, MD  REASON FOR REFERRAL: Anemia of chronic kidney disease   HISTORY OF PRESENT ILLNESS: Philip Benjamin 73 y.o. male referred for anemia of chronic kidney disease.  He is accompanied by his daughter today.  The patient has been experiencing weakness and impaired mobility, which led to a fall and subsequent hospitalization. During the hospital stay, the patient had to undergo emergent dialysis. The patient was discharged to a rehab facility but had to return to the hospital due to a COVID outbreak at the facility, during which time the patient contracted COVID, pneumonia, and a UTI. The patient's condition further deteriorated, leading to a second stay at the rehab facility. The patient is now trying to regain mobility to return home.  Patient is currently getting dialysis on Monday Wednesday Friday.  He has been managed by nephrologist [facility] in [place].  He reports feeling well and has no complaints today.  Denies any blood in stools or melena.  He is a nonalcoholic, non-smoker.  He lives with his wife in [place] but currently is living in a rehab facility. I have reviewed the past medical history, past surgical history, social history and family history with the patient   ALLERGIES:  has no known allergies.  MEDICATIONS:  Current Outpatient Medications  Medication Sig Dispense Refill   acetaminophen (TYLENOL) 650 MG CR tablet Take 650 mg by mouth every 6 (six) hours as needed for pain.     Cholecalciferol 125 MCG (5000 UT) capsule Take by mouth.     cycloSPORINE (RESTASIS) 0.05 % ophthalmic emulsion SMARTSIG:In Eye(s)     Dorzolamide HCl-Timolol Mal PF 2-0.5 % SOLN Apply 1 drop to eye 2 (two) times daily.     FLUoxetine (PROZAC) 20 MG capsule Take 20 mg by mouth daily.     folic acid (FOLVITE) 800 MCG tablet Take 800 mcg by mouth daily.     LANTUS 100 UNIT/ML injection Inject 26 Units into  the skin daily.     latanoprost (XALATAN) 0.005 % ophthalmic solution Place 1 drop into both eyes at bedtime. (Patient taking differently: Place 1 drop into the left eye at bedtime.)     multivitamin (RENA-VIT) TABS tablet Take 1 tablet by mouth at bedtime.     nystatin cream (MYCOSTATIN) Apply 1 application topically 2 (two) times daily as needed. FOR YEAST OR SKIN RASHES.  2   tamsulosin (FLOMAX) 0.4 MG CAPS capsule Take 0.4 mg by mouth daily with supper.  2   ALPRAZolam (XANAX) 0.25 MG tablet Take 1 tablet (0.25 mg total) by mouth 3 (three) times daily as needed for anxiety. (Patient not taking: Reported on 11/20/2023) 10 tablet 0   No current facility-administered medications for this visit.     REVIEW OF SYSTEMS:   Constitutional: Denies fevers, chills or night sweats Eyes: Denies blurriness of vision Ears, nose, mouth, throat, and face: Denies mucositis or sore throat Respiratory: Denies cough, dyspnea or wheezes Cardiovascular: Denies palpitation, chest discomfort or lower extremity swelling Gastrointestinal:  Denies nausea, heartburn or change in bowel habits Skin: Denies abnormal skin rashes Lymphatics: Denies new lymphadenopathy or easy bruising Neurological:Denies numbness, tingling or new weaknesses Behavioral/Psych: Mood is stable, no new changes  All other systems were reviewed with the patient and are negative.  PHYSICAL EXAMINATION:   Vitals:   11/20/23 1413  BP: 119/75  Pulse: 82  Resp: 18  Temp: 98.2 F (36.8 C)  SpO2:  100%    GENERAL:alert, no distress and comfortable SKIN: skin color, texture, turgor are normal, no rashes or significant lesions LUNGS: clear to auscultation and percussion with normal breathing effort HEART: regular rate & rhythm and no murmurs and no lower extremity edema ABDOMEN:abdomen soft, non-tender and normal bowel sounds Musculoskeletal:no cyanosis of digits and no clubbing  NEURO: alert & oriented x 3 with fluent  speech  LABORATORY DATA:  I have reviewed the data as listed  Lab Results  Component Value Date   WBC 7.7 10/10/2023   NEUTROABS 4.7 10/10/2023   HGB 8.7 (L) 10/10/2023   HCT 28.5 (L) 10/10/2023   MCV 94.1 10/10/2023   PLT 213 10/10/2023      Component Value Date/Time   NA 135 10/10/2023 0414   K 3.8 10/10/2023 0414   CL 100 10/10/2023 0414   CO2 25 10/10/2023 0414   GLUCOSE 97 10/10/2023 0414   BUN 23 10/10/2023 0414   CREATININE 4.11 (H) 10/10/2023 0414   CALCIUM 8.6 (L) 10/10/2023 0414   PROT 5.4 (L) 10/10/2023 0414   ALBUMIN 2.3 (L) 10/10/2023 0414   AST 35 10/10/2023 0414   ALT 59 (H) 10/10/2023 0414   ALKPHOS 71 10/10/2023 0414   BILITOT 0.4 10/10/2023 0414   GFRNONAA 15 (L) 10/10/2023 0414   GFRAA 44 (L) 01/14/2018 1112       Chemistry      Component Value Date/Time   NA 135 10/10/2023 0414   K 3.8 10/10/2023 0414   CL 100 10/10/2023 0414   CO2 25 10/10/2023 0414   BUN 23 10/10/2023 0414   CREATININE 4.11 (H) 10/10/2023 0414      Component Value Date/Time   CALCIUM 8.6 (L) 10/10/2023 0414   ALKPHOS 71 10/10/2023 0414   AST 35 10/10/2023 0414   ALT 59 (H) 10/10/2023 0414   BILITOT 0.4 10/10/2023 0414      Latest Reference Range & Units 08/15/23 04:24 09/29/23 16:01 10/02/23 04:47 10/03/23 04:36 10/04/23 05:14  Iron 45 - 182 ug/dL 86      UIBC ug/dL 147      TIBC 829 - 562 ug/dL 130      Saturation Ratios 17.9 - 39.5 % 30      Ferritin 24 - 336 ng/mL 188 665 (H) 664 (H) 523 (H) 579 (H)  Folate >5.9 ng/mL 3.8 (L)      (H): Data is abnormally high (L): Data is abnormally low  ASSESSMENT & PLAN:  Patient is a 73 year old male with ESRD on dialysis presenting for anemia of chronic kidney disease.    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 Iron levels are adequate and patient is receiving iron during dialysis. Discussed the role of erythropoietin (EPO) in red blood cell production and the need for exogenous  EPO in CKD patients. -Plan to start EPO injections pending insurance approval. -Check hemoglobin levels before each injection. If hemoglobin is less than 10, administer EPO. -Inform dialysis center about the plan to start EPO injections.  Return to clinic in 1 month with labs  ESRD (end stage renal disease) on dialysis [facility]) Patient was recently started on dialysis at [facility] in [place]. -Continue to follow with nephrology   Orders Placed This Encounter  Procedures   CBC with Differential/Platelet    Standing Status:   Future    Expected Date:   12/18/2023    Expiration Date:   11/19/2024   Comprehensive metabolic panel  Standing Status:   Future    Expected Date:   12/18/2023    Expiration Date:   11/19/2024   Ferritin    Standing Status:   Future    Expected Date:   12/18/2023    Expiration Date:   11/19/2024   Folate    Standing Status:   Future    Expected Date:   12/18/2023    Expiration Date:   11/19/2024   Vitamin B12    Standing Status:   Future    Expected Date:   12/18/2023    Expiration Date:   11/19/2024   Iron and TIBC    Standing Status:   Future    Expected Date:   12/18/2023    Expiration Date:   11/19/2024    Release to patient:   Immediate [1]   Kappa/lambda light chains    Standing Status:   Future    Expected Date:   12/18/2023    Expiration Date:   11/19/2024   Multiple Myeloma Panel (SPEP&IFE w/QIG)    Standing Status:   Future    Expected Date:   12/18/2023    Expiration Date:   11/19/2024    The total time spent in the appointment was 45 minutes encounter with patients including review of chart and various tests results, discussions about plan of care and coordination of care plan   All questions were answered. The patient knows to call the clinic with any problems, questions or concerns. No barriers to learning was detected.   Cindie Crumbly, MD 1/30/20253:51 PM

## 2023-11-20 NOTE — Assessment & Plan Note (Deleted)
Anemia secondary to CKD with hemoglobin of 8.7. Iron levels are adequate and patient is receiving iron during dialysis. Discussed the role of erythropoietin (EPO) in red blood cell production and the need for exogenous EPO in CKD patients. -Plan to start EPO injections pending insurance approval. -Check hemoglobin levels before each injection. If hemoglobin is less than 10, administer EPO. -Inform dialysis center about the plan to start EPO injections.  Return to clinic in 1 month with labs

## 2023-11-21 NOTE — Progress Notes (Signed)
Dialysis nurse contacted the cancer cancer to inform Dr. Anders Simmonds that patient had been scheduled for Epogen injections and was already taking Mircera 60 mcg injections q 2 weeks at the dialysis clinic.  Patient did not inform MD of this at appointment.  Epogen injection appointments canceled.  Patient's daughter is aware.

## 2023-11-27 ENCOUNTER — Inpatient Hospital Stay: Payer: Medicare Other

## 2023-12-11 ENCOUNTER — Inpatient Hospital Stay: Payer: Medicare Other

## 2023-12-16 ENCOUNTER — Ambulatory Visit: Payer: Medicare Other | Admitting: Surgery

## 2023-12-18 ENCOUNTER — Emergency Department (HOSPITAL_COMMUNITY): Payer: Medicare Other

## 2023-12-18 ENCOUNTER — Inpatient Hospital Stay (HOSPITAL_COMMUNITY): Payer: Medicare Other

## 2023-12-18 ENCOUNTER — Other Ambulatory Visit: Payer: Self-pay

## 2023-12-18 ENCOUNTER — Encounter (HOSPITAL_COMMUNITY): Payer: Self-pay

## 2023-12-18 ENCOUNTER — Inpatient Hospital Stay (HOSPITAL_COMMUNITY)
Admission: EM | Admit: 2023-12-18 | Discharge: 2023-12-23 | DRG: 064 | Disposition: A | Discharge status: Rehabilitation Facility | Payer: Medicare Other | Attending: Internal Medicine | Admitting: Internal Medicine

## 2023-12-18 ENCOUNTER — Ambulatory Visit: Payer: Medicare Other | Admitting: Surgery

## 2023-12-18 DIAGNOSIS — N400 Enlarged prostate without lower urinary tract symptoms: Secondary | ICD-10-CM | POA: Diagnosis present

## 2023-12-18 DIAGNOSIS — I10 Essential (primary) hypertension: Secondary | ICD-10-CM | POA: Diagnosis present

## 2023-12-18 DIAGNOSIS — N390 Urinary tract infection, site not specified: Secondary | ICD-10-CM | POA: Diagnosis not present

## 2023-12-18 DIAGNOSIS — I6381 Other cerebral infarction due to occlusion or stenosis of small artery: Principal | ICD-10-CM | POA: Diagnosis present

## 2023-12-18 DIAGNOSIS — R197 Diarrhea, unspecified: Secondary | ICD-10-CM | POA: Diagnosis present

## 2023-12-18 DIAGNOSIS — F03918 Unspecified dementia, unspecified severity, with other behavioral disturbance: Secondary | ICD-10-CM | POA: Diagnosis present

## 2023-12-18 DIAGNOSIS — Z794 Long term (current) use of insulin: Secondary | ICD-10-CM

## 2023-12-18 DIAGNOSIS — H5462 Unqualified visual loss, left eye, normal vision right eye: Secondary | ICD-10-CM | POA: Diagnosis present

## 2023-12-18 DIAGNOSIS — S3730XA Unspecified injury of urethra, initial encounter: Secondary | ICD-10-CM | POA: Diagnosis not present

## 2023-12-18 DIAGNOSIS — Z515 Encounter for palliative care: Secondary | ICD-10-CM | POA: Diagnosis not present

## 2023-12-18 DIAGNOSIS — F32A Depression, unspecified: Secondary | ICD-10-CM | POA: Diagnosis present

## 2023-12-18 DIAGNOSIS — I69398 Other sequelae of cerebral infarction: Secondary | ICD-10-CM | POA: Diagnosis not present

## 2023-12-18 DIAGNOSIS — G8191 Hemiplegia, unspecified affecting right dominant side: Secondary | ICD-10-CM | POA: Diagnosis present

## 2023-12-18 DIAGNOSIS — I120 Hypertensive chronic kidney disease with stage 5 chronic kidney disease or end stage renal disease: Secondary | ICD-10-CM | POA: Diagnosis present

## 2023-12-18 DIAGNOSIS — I63512 Cerebral infarction due to unspecified occlusion or stenosis of left middle cerebral artery: Secondary | ICD-10-CM | POA: Diagnosis not present

## 2023-12-18 DIAGNOSIS — R29701 NIHSS score 1: Secondary | ICD-10-CM | POA: Diagnosis present

## 2023-12-18 DIAGNOSIS — E1122 Type 2 diabetes mellitus with diabetic chronic kidney disease: Secondary | ICD-10-CM | POA: Diagnosis present

## 2023-12-18 DIAGNOSIS — E11319 Type 2 diabetes mellitus with unspecified diabetic retinopathy without macular edema: Secondary | ICD-10-CM | POA: Diagnosis present

## 2023-12-18 DIAGNOSIS — R531 Weakness: Secondary | ICD-10-CM

## 2023-12-18 DIAGNOSIS — K592 Neurogenic bowel, not elsewhere classified: Secondary | ICD-10-CM | POA: Diagnosis present

## 2023-12-18 DIAGNOSIS — Z7982 Long term (current) use of aspirin: Secondary | ICD-10-CM

## 2023-12-18 DIAGNOSIS — E119 Type 2 diabetes mellitus without complications: Secondary | ICD-10-CM

## 2023-12-18 DIAGNOSIS — E860 Dehydration: Secondary | ICD-10-CM | POA: Diagnosis present

## 2023-12-18 DIAGNOSIS — N186 End stage renal disease: Secondary | ICD-10-CM | POA: Diagnosis present

## 2023-12-18 DIAGNOSIS — R262 Difficulty in walking, not elsewhere classified: Secondary | ICD-10-CM | POA: Diagnosis present

## 2023-12-18 DIAGNOSIS — E1165 Type 2 diabetes mellitus with hyperglycemia: Secondary | ICD-10-CM | POA: Diagnosis not present

## 2023-12-18 DIAGNOSIS — Z8616 Personal history of COVID-19: Secondary | ICD-10-CM | POA: Diagnosis not present

## 2023-12-18 DIAGNOSIS — Z992 Dependence on renal dialysis: Secondary | ICD-10-CM

## 2023-12-18 DIAGNOSIS — N185 Chronic kidney disease, stage 5: Secondary | ICD-10-CM | POA: Diagnosis not present

## 2023-12-18 DIAGNOSIS — F03911 Unspecified dementia, unspecified severity, with agitation: Secondary | ICD-10-CM | POA: Diagnosis present

## 2023-12-18 DIAGNOSIS — E11649 Type 2 diabetes mellitus with hypoglycemia without coma: Secondary | ICD-10-CM | POA: Diagnosis not present

## 2023-12-18 DIAGNOSIS — T380X5A Adverse effect of glucocorticoids and synthetic analogues, initial encounter: Secondary | ICD-10-CM | POA: Diagnosis not present

## 2023-12-18 DIAGNOSIS — B961 Klebsiella pneumoniae [K. pneumoniae] as the cause of diseases classified elsewhere: Secondary | ICD-10-CM | POA: Diagnosis not present

## 2023-12-18 DIAGNOSIS — I69351 Hemiplegia and hemiparesis following cerebral infarction affecting right dominant side: Secondary | ICD-10-CM | POA: Diagnosis not present

## 2023-12-18 DIAGNOSIS — D631 Anemia in chronic kidney disease: Secondary | ICD-10-CM | POA: Diagnosis present

## 2023-12-18 DIAGNOSIS — I639 Cerebral infarction, unspecified: Secondary | ICD-10-CM | POA: Diagnosis present

## 2023-12-18 DIAGNOSIS — G992 Myelopathy in diseases classified elsewhere: Secondary | ICD-10-CM | POA: Diagnosis not present

## 2023-12-18 DIAGNOSIS — M4802 Spinal stenosis, cervical region: Secondary | ICD-10-CM | POA: Diagnosis present

## 2023-12-18 DIAGNOSIS — H409 Unspecified glaucoma: Secondary | ICD-10-CM | POA: Diagnosis present

## 2023-12-18 DIAGNOSIS — Z1152 Encounter for screening for COVID-19: Secondary | ICD-10-CM | POA: Diagnosis not present

## 2023-12-18 DIAGNOSIS — N2581 Secondary hyperparathyroidism of renal origin: Secondary | ICD-10-CM | POA: Diagnosis present

## 2023-12-18 DIAGNOSIS — Z79899 Other long term (current) drug therapy: Secondary | ICD-10-CM | POA: Diagnosis not present

## 2023-12-18 DIAGNOSIS — R252 Cramp and spasm: Secondary | ICD-10-CM | POA: Diagnosis present

## 2023-12-18 DIAGNOSIS — F411 Generalized anxiety disorder: Secondary | ICD-10-CM | POA: Diagnosis not present

## 2023-12-18 DIAGNOSIS — R339 Retention of urine, unspecified: Secondary | ICD-10-CM | POA: Diagnosis not present

## 2023-12-18 DIAGNOSIS — L89156 Pressure-induced deep tissue damage of sacral region: Secondary | ICD-10-CM | POA: Diagnosis present

## 2023-12-18 DIAGNOSIS — F0394 Unspecified dementia, unspecified severity, with anxiety: Secondary | ICD-10-CM | POA: Diagnosis present

## 2023-12-18 DIAGNOSIS — E782 Mixed hyperlipidemia: Secondary | ICD-10-CM | POA: Diagnosis present

## 2023-12-18 DIAGNOSIS — I69341 Monoplegia of lower limb following cerebral infarction affecting right dominant side: Secondary | ICD-10-CM | POA: Diagnosis not present

## 2023-12-18 DIAGNOSIS — M47812 Spondylosis without myelopathy or radiculopathy, cervical region: Secondary | ICD-10-CM

## 2023-12-18 DIAGNOSIS — Z7189 Other specified counseling: Secondary | ICD-10-CM | POA: Diagnosis not present

## 2023-12-18 DIAGNOSIS — I6389 Other cerebral infarction: Secondary | ICD-10-CM | POA: Diagnosis not present

## 2023-12-18 LAB — RESP PANEL BY RT-PCR (RSV, FLU A&B, COVID)  RVPGX2
Influenza A by PCR: NEGATIVE
Influenza B by PCR: NEGATIVE
Resp Syncytial Virus by PCR: NEGATIVE
SARS Coronavirus 2 by RT PCR: NEGATIVE

## 2023-12-18 LAB — BASIC METABOLIC PANEL
Anion gap: 12 (ref 5–15)
BUN: 20 mg/dL (ref 8–23)
CO2: 27 mmol/L (ref 22–32)
Calcium: 9.3 mg/dL (ref 8.9–10.3)
Chloride: 98 mmol/L (ref 98–111)
Creatinine, Ser: 3.02 mg/dL — ABNORMAL HIGH (ref 0.61–1.24)
GFR, Estimated: 21 mL/min — ABNORMAL LOW (ref >60)
Glucose, Bld: 99 mg/dL (ref 70–99)
Potassium: 3.5 mmol/L (ref 3.5–5.1)
Sodium: 137 mmol/L (ref 135–145)

## 2023-12-18 LAB — LIPID PANEL
Cholesterol: 80 mg/dL (ref 0–200)
HDL: 39 mg/dL — ABNORMAL LOW (ref >40)
LDL Cholesterol: 28 mg/dL (ref 0–99)
Total CHOL/HDL Ratio: 2.1 {ratio}
Triglycerides: 65 mg/dL (ref <150)
VLDL: 13 mg/dL (ref 0–40)

## 2023-12-18 LAB — HEMOGLOBIN A1C
Hgb A1c MFr Bld: 5.6 % (ref 4.8–5.6)
Mean Plasma Glucose: 114.02 mg/dL

## 2023-12-18 LAB — TROPONIN I (HIGH SENSITIVITY)
Troponin I (High Sensitivity): 52 ng/L — ABNORMAL HIGH (ref <18)
Troponin I (High Sensitivity): 52 ng/L — ABNORMAL HIGH (ref <18)

## 2023-12-18 LAB — CBC
HCT: 31.8 % — ABNORMAL LOW (ref 39.0–52.0)
Hemoglobin: 10 g/dL — ABNORMAL LOW (ref 13.0–17.0)
MCH: 30.3 pg (ref 26.0–34.0)
MCHC: 31.4 g/dL (ref 30.0–36.0)
MCV: 96.4 fL (ref 80.0–100.0)
Platelets: 129 10*3/uL — ABNORMAL LOW (ref 150–400)
RBC: 3.3 MIL/uL — ABNORMAL LOW (ref 4.22–5.81)
RDW: 11.9 % (ref 11.5–15.5)
WBC: 5.6 10*3/uL (ref 4.0–10.5)
nRBC: 0 % (ref 0.0–0.2)

## 2023-12-18 LAB — GLUCOSE, CAPILLARY: Glucose-Capillary: 220 mg/dL — ABNORMAL HIGH (ref 70–99)

## 2023-12-18 LAB — CBG MONITORING, ED: Glucose-Capillary: 85 mg/dL (ref 70–99)

## 2023-12-18 LAB — TSH: TSH: 2.569 u[IU]/mL (ref 0.350–4.500)

## 2023-12-18 LAB — VITAMIN B12: Vitamin B-12: 690 pg/mL (ref 180–914)

## 2023-12-18 LAB — FOLATE: Folate: 32.4 ng/mL (ref >5.9)

## 2023-12-18 MED ORDER — ASPIRIN 81 MG PO CHEW
81.0000 mg | CHEWABLE_TABLET | Freq: Every day | ORAL | Status: DC
  Administered 2023-12-18 – 2023-12-23 (×6): 81 mg via ORAL
  Filled 2023-12-18 (×10): qty 1

## 2023-12-18 MED ORDER — FLUOXETINE HCL 10 MG PO CAPS
20.0000 mg | ORAL_CAPSULE | Freq: Every day | ORAL | Status: DC
  Administered 2023-12-18 – 2023-12-23 (×6): 20 mg via ORAL
  Filled 2023-12-18 (×6): qty 2

## 2023-12-18 MED ORDER — RENA-VITE PO TABS
1.0000 | ORAL_TABLET | Freq: Every day | ORAL | Status: DC
  Administered 2023-12-18 – 2023-12-22 (×5): 1 via ORAL
  Filled 2023-12-18 (×5): qty 1

## 2023-12-18 MED ORDER — SODIUM CHLORIDE 0.9 % IV SOLN: INTRAVENOUS | Status: DC

## 2023-12-18 MED ORDER — LATANOPROST 0.005 % OP SOLN
1.0000 [drp] | Freq: Every day | OPHTHALMIC | Status: DC
  Administered 2023-12-18 – 2023-12-22 (×5): 1 [drp] via OPHTHALMIC
  Filled 2023-12-18 (×2): qty 2.5

## 2023-12-18 MED ORDER — CYCLOSPORINE 0.05 % OP EMUL
1.0000 [drp] | Freq: Two times a day (BID) | OPHTHALMIC | Status: DC
  Administered 2023-12-18 – 2023-12-23 (×10): 1 [drp] via OPHTHALMIC
  Filled 2023-12-18 (×9): qty 30

## 2023-12-18 MED ORDER — ALPRAZOLAM 0.25 MG PO TABS: 0.2500 mg | ORAL_TABLET | Freq: Three times a day (TID) | ORAL | Status: DC | PRN

## 2023-12-18 MED ORDER — TAMSULOSIN HCL 0.4 MG PO CAPS
0.4000 mg | ORAL_CAPSULE | Freq: Every day | ORAL | Status: DC
  Administered 2023-12-18 – 2023-12-22 (×4): 0.4 mg via ORAL
  Filled 2023-12-18 (×4): qty 1

## 2023-12-18 MED ORDER — ATORVASTATIN CALCIUM 40 MG PO TABS
80.0000 mg | ORAL_TABLET | Freq: Every day | ORAL | Status: DC
  Administered 2023-12-18 – 2023-12-19 (×2): 80 mg via ORAL
  Filled 2023-12-18 (×2): qty 2

## 2023-12-18 MED ORDER — DORZOLAMIDE HCL-TIMOLOL MAL PF 2-0.5 % OP SOLN: 1.0000 [drp] | Freq: Two times a day (BID) | OPHTHALMIC | Status: DC

## 2023-12-18 MED ORDER — SENNOSIDES-DOCUSATE SODIUM 8.6-50 MG PO TABS: 1.0000 | ORAL_TABLET | Freq: Every evening | ORAL | Status: DC | PRN

## 2023-12-18 MED ORDER — STROKE: EARLY STAGES OF RECOVERY BOOK
Freq: Once | Status: DC
  Filled 2023-12-18: qty 1

## 2023-12-18 MED ORDER — DORZOLAMIDE HCL-TIMOLOL MAL 2-0.5 % OP SOLN
1.0000 [drp] | Freq: Two times a day (BID) | OPHTHALMIC | Status: DC
  Administered 2023-12-18 – 2023-12-23 (×10): 1 [drp] via OPHTHALMIC
  Filled 2023-12-18 (×3): qty 10

## 2023-12-18 MED ORDER — ACETAMINOPHEN 650 MG RE SUPP: 650.0000 mg | RECTAL | Status: DC | PRN

## 2023-12-18 MED ORDER — INSULIN GLARGINE 100 UNIT/ML ~~LOC~~ SOLN
26.0000 [IU] | Freq: Every day | SUBCUTANEOUS | Status: DC
  Filled 2023-12-18 (×2): qty 0.26

## 2023-12-18 MED ORDER — INSULIN GLARGINE-YFGN 100 UNIT/ML ~~LOC~~ SOLN
26.0000 [IU] | Freq: Every day | SUBCUTANEOUS | Status: DC
  Administered 2023-12-19: 26 [IU] via SUBCUTANEOUS
  Filled 2023-12-18 (×4): qty 0.26

## 2023-12-18 MED ORDER — SODIUM CHLORIDE 0.9 % IV SOLN: INTRAVENOUS | Status: AC

## 2023-12-18 MED ORDER — ACETAMINOPHEN 160 MG/5ML PO SOLN: 650.0000 mg | ORAL | Status: DC | PRN

## 2023-12-18 MED ORDER — HEPARIN SODIUM (PORCINE) 5000 UNIT/ML IJ SOLN
5000.0000 [IU] | Freq: Three times a day (TID) | INTRAMUSCULAR | Status: DC
  Administered 2023-12-18 – 2023-12-23 (×14): 5000 [IU] via SUBCUTANEOUS
  Filled 2023-12-18 (×14): qty 1

## 2023-12-18 MED ORDER — INSULIN ASPART 100 UNIT/ML IJ SOLN
0.0000 [IU] | Freq: Three times a day (TID) | INTRAMUSCULAR | Status: DC
  Administered 2023-12-19: 2 [IU] via SUBCUTANEOUS
  Administered 2023-12-19: 1 [IU] via SUBCUTANEOUS
  Administered 2023-12-20: 4 [IU] via SUBCUTANEOUS
  Administered 2023-12-20 – 2023-12-21 (×3): 3 [IU] via SUBCUTANEOUS
  Administered 2023-12-21: 4 [IU] via SUBCUTANEOUS

## 2023-12-18 MED ORDER — ACETAMINOPHEN 325 MG PO TABS
650.0000 mg | ORAL_TABLET | ORAL | Status: DC | PRN
  Administered 2023-12-19: 650 mg via ORAL
  Filled 2023-12-18: qty 2

## 2023-12-18 NOTE — Consult Note (Signed)
 I connected with  Philip Benjamin on 12/18/23 by a video enabled telemedicine application and verified that I am speaking with the correct person using two identifiers.   I discussed the limitations of evaluation and management by telemedicine. The patient expressed understanding and agreed to proceed.  Location of patient: [facility] Location of physician: [facility]   Neurology Consultation Reason for Consult: Stroke Referring Physician: Dr Nevin Bloodgood  CC: Weakness  History is obtained from: Patient, wife and daughter at bedside, chart review  HPI: Philip Benjamin is a 73 y.o. male with past medical history of end-stage renal disease on dialysis Monday Wednesday Friday, hypertension, diabetes, glaucoma of left eye (legally blind) and worsening vision in right eye due to glaucoma per patient's ophthalmologist who presented with weakness.  Patient states he was in the hospital for few weeks and then went to rehab where he was gradually becoming stronger. However for the last 2 weeks he has been at home and feels like his improvement has plateaued and if anything he is weaker. States he feels weaker in his right side, at times feels like his right leg is dragging. Also reports numbness in both hands/fingers, right more than left when he wakes up in the morning.  MRI brain was obtained which showed acute ischemic stroke.  Therefore neurology was consulted.  Last known normal: More than 2 weeks ago Event happened at home No tPA as outside window No thrombectomy as no large vessel occlusion mRS 1/2   ROS: All other systems reviewed and negative except as noted in the HPI.   Past Medical History:  Diagnosis Date   BPH (benign prostatic hyperplasia)    Chronic kidney disease    ckd stage 3   Diabetes mellitus without complication (HCC)    Hypertension     History reviewed. No pertinent family history.   Social History:  reports that he has never smoked. He has never  used smokeless tobacco. He reports that he does not drink alcohol and does not use drugs.   Exam: Current vital signs: BP 115/76   Pulse 63   Temp 98.2 F (36.8 C) (Oral)   Resp 15   Ht 6\' 1"  (1.854 m)   Wt 86.2 kg   SpO2 100%   BMI 25.07 kg/m  Vital signs in last 24 hours: Temp:  [98.2 F (36.8 C)] 98.2 F (36.8 C) (02/27 0854) Pulse Rate:  [62-64] 63 (02/27 1108) Resp:  [10-18] 15 (02/27 1108) BP: (114-120)/(67-76) 115/76 (02/27 1108) SpO2:  [98 %-100 %] 100 % (02/27 1108) Weight:  [86.2 kg] 86.2 kg (02/27 0850)   Physical Exam  Constitutional: Appears well-developed and well-nourished.  Psych: Affect appropriate to situation Neuro: AOx3, cranial nerves appear grossly intact, antigravity strength without drift in all 4 extremities, sensation intact to light touch, FTN intact bilaterally  NIHSS 0   I have reviewed labs in epic and the results pertinent to this consultation are: CBC:  Recent Labs  Lab 12/18/23 0922  WBC 5.6  HGB 10.0*  HCT 31.8*  MCV 96.4  PLT 129*    Basic Metabolic Panel:  Lab Results  Component Value Date   NA 137 12/18/2023   K 3.5 12/18/2023   CO2 27 12/18/2023   GLUCOSE 99 12/18/2023   BUN 20 12/18/2023   CREATININE 3.02 (H) 12/18/2023   CALCIUM 9.3 12/18/2023   GFRNONAA 21 (L) 12/18/2023   GFRAA 44 (L) 01/14/2018   Lipid Panel: No results found for: "  LDLCALC" HgbA1c:  Lab Results  Component Value Date   HGBA1C 5.6 08/14/2023   Urine Drug Screen: No results found for: "LABOPIA", "COCAINSCRNUR", "LABBENZ", "AMPHETMU", "THCU", "LABBARB"  Alcohol Level No results found for: "ETH"   I have reviewed the images obtained:   MRI Brain without contrast 12/18/2023: 3 mm acute infarct within the left lentiform nucleus. Background parenchymal atrophy, advanced chronic small vessel ischemic disease and chronic infarcts, as detailed. Several nonspecific chronic microhemorrhages scattered within the supratentorial and infratentorial  brain.     ASSESSMENT/PLAN: 19 old male with multiple medical comorbidities as noted in HPI who presented with weakness worse on the right side.  Acute ischemic stroke (incidental) -Patient reports gradual worsening weakness.  The stroke seen on MRI is really small and therefore most likely asymptomatic -Etiology: Small vessel disease  Recommendations: -Patient states he has had bleeding GI ulcers more than 10 years ago.  His current platelets are 129.  Therefore we will restart on aspirin 81 mg daily for secondary stroke prevention -Recommend high intensity atorvastatin if LDL more than 75 -MRA head without contrast as well as US carotid pending TTE ordered and pending -Will also order MRI C-spine without contrast as patient is reporting increased weakness and at times numbness in right upper and right lower extremity -Some of patient's symptoms (numbness in fingers when waking up) are concerning for carpal tunnel syndrome.  Therefore would recommend EMG/nerve conduction study when he follows up with neurology -Will check B12, folate and TSH due to generalized weakness -Goal blood pressure: Normotension -PT/OT -Stroke education -Discussed plan in detail with patient and his family at bedside -Discussed plan with Dr. Flossie Dibble via secure chat  ADDENDUM -Reviewed carotid ultrasound and MRA head.  TTE still pending.  -Reviewed MRI C-spine.  Discussed with Dr. Flossie Dibble about talking to neurosurgery about MRI C-spine findings -Ambulatory referral to neurology ordered   Thank you for allowing [place] to participate in the care of this patient. If you have any further questions, please contact  me or neurohospitalist.   Lindie Spruce [facility] neurohospitalist

## 2023-12-18 NOTE — Assessment & Plan Note (Signed)
-   Initiating high-dose statins -Follow-up with lipid panel, with LDL goal of less than 70

## 2023-12-18 NOTE — ED Provider Notes (Signed)
 [place] EMERGENCY DEPARTMENT AT [facility] Provider Note   CSN: [id number] Arrival date & time: 12/18/23  2130     History {Add pertinent medical, surgical, social history, OB history to HPI:1} Chief Complaint  Patient presents with   Weakness    Philip Benjamin is a 73 y.o. male.  73 year old male with past medical history of diabetes, hypertension, hyperlipidemia, and end-stage renal disease presenting to the emergency department today with weakness.  The patient states that for the past 48 hours he has been having generalized weakness but states that he does feel little weaker on the right side.  He states that he is feeling weak in his right upper and right lower extremity.  The patient has not had any nausea or vomiting.  He denies any cough or chest pain.  He did have dialysis yesterday as normally scheduled and apparently had a normal dialysis session.  He denies any associated headache with this.  States that the symptoms have gradually worsened.   Weakness      Home Medications Prior to Admission medications   Medication Sig Start Date End Date Taking? Authorizing Provider  acetaminophen (TYLENOL) 650 MG CR tablet Take 650 mg by mouth every 6 (six) hours as needed for pain.    [provider]  ALPRAZolam Prudy Feeler) 0.25 MG tablet Take 1 tablet (0.25 mg total) by mouth 3 (three) times daily as needed for anxiety. Patient not taking: Reported on 11/20/2023 10/09/23   Catarina Hartshorn, MD  Cholecalciferol 125 MCG (5000 UT) capsule Take by mouth.    [provider]  cycloSPORINE (RESTASIS) 0.05 % ophthalmic emulsion SMARTSIG:In Eye(s) 08/13/23   [provider]  Dorzolamide HCl-Timolol Mal PF 2-0.5 % SOLN Apply 1 drop to eye 2 (two) times daily. 08/18/23   [provider]  FLUoxetine (PROZAC) 20 MG capsule Take 20 mg by mouth daily.    [provider]  folic acid (FOLVITE) 800 MCG tablet Take 800 mcg by mouth daily.     [provider]  LANTUS 100 UNIT/ML injection Inject 26 Units into the skin daily.    [provider]  latanoprost (XALATAN) 0.005 % ophthalmic solution Place 1 drop into both eyes at bedtime. Patient taking differently: Place 1 drop into the left eye at bedtime. 08/25/23   Catarina Hartshorn, MD  multivitamin (RENA-VIT) TABS tablet Take 1 tablet by mouth at bedtime. 08/20/23   Catarina Hartshorn, MD  nystatin cream (MYCOSTATIN) Apply 1 application topically 2 (two) times daily as needed. FOR YEAST OR SKIN RASHES. 12/16/17   [provider]  tamsulosin (FLOMAX) 0.4 MG CAPS capsule Take 0.4 mg by mouth daily with supper. 12/16/17   [provider]      Allergies    Patient has no known allergies.    Review of Systems   Review of Systems  Neurological:  Positive for weakness.  All other systems reviewed and are negative.   Physical Exam Updated Vital Signs BP 118/69 (BP Location: Left Arm)   Pulse 64   Temp 98.2 F (36.8 C) (Oral)   Resp 18   Ht 6\' 1"  (1.854 m)   Wt 86.2 kg   SpO2 98%   BMI 25.07 kg/m  Physical Exam Vitals and nursing note reviewed.   Gen: NAD Eyes: PERRL, EOMI HEENT: no oropharyngeal swelling Neck: trachea midline Resp: clear to auscultation bilaterally Card: RRR, no murmurs, rubs, or gallops Abd: nontender, nondistended Extremities: no calf tenderness, no edema Vascular: 2+ radial  pulses bilaterally, 2+ DP pulses bilaterally Neuro: NIH stroke scale of 0 but the patient does have 4 out of 5 strength in the right lower extremity and is slightly weaker in the right upper extremity compared to the left Skin: no rashes Psyc: acting appropriately   ED Results / Procedures / Treatments   Labs (all labs ordered are listed, but only abnormal results are displayed) Labs Reviewed - No data to display  EKG None  Radiology No results found.  Procedures Procedures  {Document cardiac monitor, telemetry assessment procedure when  appropriate:1}  Medications Ordered in ED Medications - No data to display  ED Course/ Medical Decision Making/ A&P   {   Click here for ABCD2, HEART and other calculatorsREFRESH Note before signing :1}                              Medical Decision Making 73 year old male with past medical history of diabetes, hypertension, and end-stage renal disease presenting to the emergency department today with weakness.  The patient does seem to be slightly weaker on the right than the left ear.  I will further evaluate him here with basic labs to evaluate for anemia or electrolyte abnormalities.  Also obtain a COVID and flu swab as some elderly patients are presenting with primary weakness rather than overt viral symptoms.  Will also obtain an MRI for further evaluation for acute CVA.  The patient denies any headache so suspicion for intracranial hemorrhage is low at this time so we will hold off on CT scan given his symptoms now for 2 days.  He will likely require admission.  Amount and/or Complexity of Data Reviewed Labs: ordered. Radiology: ordered.   ***  {Document critical care time when appropriate:1} {Document review of labs and clinical decision tools ie heart score, Chads2Vasc2 etc:1}  {Document your independent review of radiology images, and any outside records:1} {Document your discussion with family members, caretakers, and with consultants:1} {Document social determinants of health affecting pt's care:1} {Document your decision making why or why not admission, treatments were needed:1} Final Clinical Impression(s) / ED Diagnoses Final diagnoses:  None    Rx / DC Orders ED Discharge Orders     None

## 2023-12-18 NOTE — Hospital Course (Signed)
 Philip Benjamin is a 73-year-old male with extensive history of DM2, HTN, HLD,ESRD on HD presenting with chief complaint of weakness.  Patient reports right-sided weakness in the past 48 hours, right upper and lower extremity.  No change in speech or visual deficits.   States that his weakness, symptoms are gradually worsened but not significantly, no improvement. He has completed dialysis yesterday   ED: Blood pressure 115/76, pulse 63, temperature 98.2 F (36.8 C), RR 15, height 6\' 1"  (1.854 m), weight 86.2 kg, SpO2 100%.   Labs-reviewed CBC CMP, creatinine 3.02, troponin 52, 52, hemoglobin 5.6, hematocrit 10.0, Respiratory panel, influenza A/B, SARS-CoV-2, RSV all negative Chest x-ray within normal limits  MRI of the brain: IMPRESSION: 1. 3 mm acute infarct within the left lentiform nucleus. 2. Background parenchymal atrophy, advanced chronic small vessel ischemic disease and chronic infarcts, as detailed. 3. Several nonspecific chronic microhemorrhages scattered within the supratentorial and infratentorial brain. 4. Small-volume fluid within bilateral mastoid air cells. 5. Asymmetric T2 hyperintense signal within the right petrous apex, potentially reflecting trapped fluid.  EDP requested patient to be admitted for acute stroke workup

## 2023-12-18 NOTE — H&P (Signed)
 History and Physical   Patient: Philip Benjamin                            PCP: Kirstie Peri, MD                    DOB: 1950/12/01            DOA: 12/18/2023 MWN:[id number]             DOS: 12/18/2023, 12:54 PM  Kirstie Peri, MD  Patient coming from:   HOME  I have personally reviewed patient's medical records, in electronic medical records, including:  [place] link, and care everywhere.    Chief Complaint:   Chief Complaint  Patient presents with   Weakness    History of present illness:    Philip Benjamin is a 73-year-old male with extensive history of DM2, HTN, HLD,ESRD on HD presenting with chief complaint of weakness.  Patient reports right-sided weakness in the past 48 hours, right upper and lower extremity.  No change in speech or visual deficits.   States that his weakness, symptoms are gradually worsened but not significantly, no improvement. He has completed dialysis yesterday   ED: Blood pressure 115/76, pulse 63, temperature 98.2 F (36.8 C), RR 15, height 6\' 1"  (1.854 m), weight 86.2 kg, SpO2 100%.   Labs-reviewed CBC CMP, creatinine 3.02, troponin 52, 52, hemoglobin 5.6, hematocrit 10.0, Respiratory panel, influenza A/B, SARS-CoV-2, RSV all negative Chest x-ray within normal limits  MRI of the brain: IMPRESSION: 1. 3 mm acute infarct within the left lentiform nucleus. 2. Background parenchymal atrophy, advanced chronic small vessel ischemic disease and chronic infarcts, as detailed. 3. Several nonspecific chronic microhemorrhages scattered within the supratentorial and infratentorial brain. 4. Small-volume fluid within bilateral mastoid air cells. 5. Asymmetric T2 hyperintense signal within the right petrous apex, potentially reflecting trapped fluid.  EDP requested patient to be admitted for acute stroke workup    Patient Denies having: Fever, Chills, Cough, SOB, Chest Pain, Abd pain, N/V/D, headache, dizziness, lightheadedness,  Dysuria, Joint pain,  rash, open wounds     Review of Systems: As per HPI, otherwise 10 point review of systems were negative.   ----------------------------------------------------------------------------------------------------------------------  No Known Allergies  Home MEDs:  Prior to Admission medications   Medication Sig Start Date End Date Taking? Authorizing Provider  acetaminophen (TYLENOL) 650 MG CR tablet Take 650 mg by mouth every 6 (six) hours as needed for pain.   Yes [provider]  ALPRAZolam (XANAX) 0.25 MG tablet Take 1 tablet (0.25 mg total) by mouth 3 (three) times daily as needed for anxiety. 10/09/23  Yes Tat, Onalee Hua, MD  atorvastatin (LIPITOR) 20 MG tablet Take 1 tablet by mouth daily. 12/06/23 01/05/24 Yes [provider]  Cholecalciferol 125 MCG (5000 UT) capsule Take by mouth.   Yes [provider]  cycloSPORINE (RESTASIS) 0.05 % ophthalmic emulsion SMARTSIG:In Eye(s) 08/13/23  Yes [provider]  Dorzolamide HCl-Timolol Mal PF 2-0.5 % SOLN Apply 1 drop to eye 2 (two) times daily. 08/18/23  Yes [provider]  Fluoxetine HCl, PMDD, 20 MG TABS Take 1 tablet by mouth daily. 12/07/23 01/06/24 Yes [provider]  folic acid (FOLVITE) 800 MCG tablet Take 800 mcg by mouth daily.   Yes [provider]  LANTUS 100 UNIT/ML injection Inject 26 Units into the skin daily.   Yes [provider]  latanoprost (XALATAN) 0.005 % ophthalmic solution  Place 1 drop into both eyes at bedtime. Patient taking differently: Place 1 drop into the left eye at bedtime. 08/25/23  Yes Tat, Onalee Hua, MD  multivitamin (RENA-VIT) TABS tablet Take 1 tablet by mouth at bedtime. 08/20/23  Yes Tat, Onalee Hua, MD  nystatin cream (MYCOSTATIN) Apply 1 application topically 2 (two) times daily as needed. FOR YEAST OR SKIN RASHES. 12/16/17  Yes [provider]  ondansetron (ZOFRAN) 4 MG tablet Take by mouth 2 (two) times daily. 12/06/23  Yes [provider]  tamsulosin (FLOMAX) 0.4 MG CAPS capsule Take 0.4 mg by mouth daily with supper. 12/16/17  Yes [provider]    PRN MEDs: acetaminophen **OR** acetaminophen (TYLENOL) oral liquid 160 mg/5 mL **OR** acetaminophen, ALPRAZolam, senna-docusate  Past Medical History:  Diagnosis Date   BPH (benign prostatic hyperplasia)    Chronic kidney disease    ckd stage 3   Diabetes mellitus without complication (HCC)    Hypertension     Past Surgical History:  Procedure Laterality Date   EYE SURGERY     INGUINAL HERNIA REPAIR Bilateral 01/23/2018   Procedure: BILATERAL OPEN INGUINAL HERNIA REPAIR WITH MESH;  Surgeon: Jimmye Norman, MD;  Location: [facility] OR;  Service: General;  Laterality: Bilateral;   INSERTION OF MESH Bilateral 01/23/2018   Procedure: INSERTION OF MESH;  Surgeon: Jimmye Norman, MD;  Location: [facility] OR;  Service: General;  Laterality: Bilateral;   IR FLUORO GUIDE CV LINE RIGHT  08/15/2023   IR PATIENT EVAL TECH 0-60 MINS  11/18/2023   IR PERC CHOLECYSTOSTOMY  10/06/2023   IR US GUIDE BX ASP/DRAIN  10/06/2023   IR US GUIDE VASC ACCESS RIGHT  08/15/2023   VASECTOMY     ~1985     reports that he has never smoked. He has never used smokeless tobacco. He reports that he does not drink alcohol and does not use drugs.   History reviewed. No pertinent family history.  Physical Exam:   Vitals:   12/18/23 0900 12/18/23 0915 12/18/23 0930 12/18/23 1108  BP: 118/67 114/67 120/75 115/76  Pulse: 63 63 62 63  Resp:  10 17 15   Temp:      TempSrc:      SpO2: 99% 100% 100% 100%  Weight:      Height:       Constitutional: NAD, calm, comfortable Eyes: PERRL, lids and conjunctivae normal ENMT: Mucous membranes are moist. Posterior pharynx clear of any exudate or lesions.Normal dentition.  Neck: normal, supple, no masses, no thyromegaly Respiratory: clear to auscultation bilaterally, no wheezing, no crackles. Normal respiratory effort. No accessory muscle use.   Cardiovascular: Regular rate and rhythm, no murmurs / rubs / gallops. No extremity edema. 2+ pedal pulses. No carotid bruits.  Abdomen: no tenderness, no masses palpated. No hepatosplenomegaly. Bowel sounds positive.  Musculoskeletal: no clubbing / cyanosis. No joint deformity upper and lower extremities. Good ROM, no contractures. Normal muscle tone.  Neurologic: Cognition intact, speech intact, CN II-XII grossly intact. Sensation intact, DTR normal.  Strength 4 out of 5 on the right upper lower extremity, 5 out of 5 in the left upper and lower extremity-positive reflexes, sensation intact Psychiatric: Normal judgment and insight. Alert and oriented x 3. Normal mood.  Skin: no rashes, lesions, ulcers. No induration Decubitus/ulcers: None visible Wounds: per nursing documentation         Labs on admission:    I have personally reviewed following labs and imaging studies  CBC: Recent Labs  Lab 12/18/23 0922  WBC  5.6  HGB 10.0*  HCT 31.8*  MCV 96.4  PLT 129*   Basic Metabolic Panel: Recent Labs  Lab 12/18/23 0922  NA 137  K 3.5  CL 98  CO2 27  GLUCOSE 99  BUN 20  CREATININE 3.02*  CALCIUM 9.3   GFR: Estimated Creatinine Clearance: 24.3 mL/min (A) (by C-G formula based on SCr of 3.02 mg/dL (H)).  Urine analysis:    Component Value Date/Time   COLORURINE AMBER (A) 09/29/2023 1524   APPEARANCEUR CLOUDY (A) 09/29/2023 1524   LABSPEC 1.017 09/29/2023 1524   PHURINE 5.0 09/29/2023 1524   GLUCOSEU NEGATIVE 09/29/2023 1524   HGBUR SMALL (A) 09/29/2023 1524   BILIRUBINUR NEGATIVE 09/29/2023 1524   KETONESUR NEGATIVE 09/29/2023 1524   PROTEINUR 100 (A) 09/29/2023 1524   NITRITE NEGATIVE 09/29/2023 1524   LEUKOCYTESUR MODERATE (A) 09/29/2023 1524    Last A1C:  Lab Results  Component Value Date   HGBA1C 5.6 08/14/2023     Radiologic Exams on Admission:   DG Chest Port 1 View Result Date: 12/18/2023 CLINICAL DATA:  Weakness. EXAM: PORTABLE CHEST 1 VIEW  COMPARISON:  09/29/2023. FINDINGS: Low lung volume. Bilateral lung fields are clear. No acute consolidation or lung collapse. No pulmonary edema. Bilateral costophrenic angles are clear. Stable cardio-mediastinal silhouette. No acute osseous abnormalities. The soft tissues are within normal limits. Right IJ hemodialysis catheter noted with its tip overlying the cavoatrial junction region. IMPRESSION: No active disease. Electronically Signed   By: Jules Schick M.D.   On: 12/18/2023 11:48   MR BRAIN WO CONTRAST Result Date: 12/18/2023 CLINICAL DATA:  Provided history: Neuro deficit, acute, stroke suspected. EXAM: MRI HEAD WITHOUT CONTRAST TECHNIQUE: Multiplanar, multiecho pulse sequences of the brain and surrounding structures were obtained without intravenous contrast. COMPARISON:  Head CT 05/20/2023. FINDINGS: Brain: Mild-to-moderate generalized cerebral atrophy. Commensurate prominence of the ventricles and sulci. 3 mm acute infarct within the left lentiform nucleus (series 5, image 26). Chronic lacunar infarcts within the bilateral cerebral hemispheric white matter, within the left thalamus and within the pons. Multifocal T2 FLAIR hyperintense signal abnormality elsewhere within the cerebral white matter (advanced) and pons (moderate), nonspecific but also compatible with chronic small vessel ischemic disease. Several nonspecific chronic microhemorrhages scattered within the supratentorial and infratentorial brain. Small chronic infarct within the left cerebellar hemisphere. No cortical encephalomalacia is identified. No evidence of an intracranial mass. No extra-axial fluid collection. No midline shift. Vascular: Maintained flow voids within the proximal large arterial vessels. Skull and upper cervical spine: No focal worrisome marrow lesion. Sinuses/Orbits: No mass or acute finding within the imaged orbits. Prior bilateral ocular lens replacement. Trace mucosal thickening within the left maxillary sinus.  Other: Small-volume fluid within bilateral mastoid air cells. Asymmetric T2 hyperintense signal within the right petrous apex, potentially reflecting trapped fluid. IMPRESSION: 1. 3 mm acute infarct within the left lentiform nucleus. 2. Background parenchymal atrophy, advanced chronic small vessel ischemic disease and chronic infarcts, as detailed. 3. Several nonspecific chronic microhemorrhages scattered within the supratentorial and infratentorial brain. 4. Small-volume fluid within bilateral mastoid air cells. 5. Asymmetric T2 hyperintense signal within the right petrous apex, potentially reflecting trapped fluid. Electronically Signed   By: Jackey Loge D.O.   On: 12/18/2023 10:28    EKG:   Independently reviewed.  Orders placed or performed during the hospital encounter of 12/18/23   ED EKG   ED EKG   EKG 12-Lead   EKG 12-Lead   ---------------------------------------------------------------------------------------------------------------------------------------    Assessment / Plan:  Principal Problem:   Acute stroke due to ischemia [facility]) Active Problems:   T2DM (type 2 diabetes mellitus) (HCC)   BPH (benign prostatic hyperplasia)   ESRD (end stage renal disease) on dialysis [facility])   Essential hypertension   Mixed hyperlipidemia   Glaucoma   Generalized weakness   Anemia in chronic kidney disease   Assessment and Plan: * Acute stroke due to ischemia (HCC) -Admit admission to telemetry for close observation, -Continue neurochecks -MRI of the brain reviewed: -Obtaining MRA, 2D echocardiogram, bilateral carotid studies -Consulting neurology for further evaluation recommendation -Continue high-dose statins, aspirin, Plavix -We will holding BP meds, allowing for permissive hypertension -Consulting PT/OT/speech -Following up with labs-including lipid panel,  MRI of the brain: IMPRESSION: 1. 3 mm acute infarct within the left lentiform nucleus. 2. Background parenchymal  atrophy, advanced chronic small vessel ischemic disease and chronic infarcts, as detailed. 3. Several nonspecific chronic microhemorrhages scattered within the supratentorial and infratentorial brain. 4. Small-volume fluid within bilateral mastoid air cells. 5. Asymmetric T2 hyperintense signal within the right petrous apex, potentially reflecting trapped fluid.  T2DM (type 2 diabetes mellitus) (HCC) - Checking hemoglobin A1c -Will check CBG q. ACH S with Isai coverage Continue long-acting insulin -Last A1c 5.6  BPH (benign prostatic hyperplasia) - Monitoring urine output,  -Continue Flomax  Anemia in chronic kidney disease Monitoring H&H, remained stable  Generalized weakness - Consulting PT OT for evaluation recommendations   Glaucoma - With history of increased intraocular pressure,  - Continue the use of latanoprost and also dorzolamide/timolol. -Continue outpatient follow-up with ophthalmology service.  Mixed hyperlipidemia - Initiating high-dose statins -Follow-up with lipid panel, with LDL goal of less than 70  Essential hypertension - Currently on metoprolol-holding  to allow permissive hypertension- Although patient is about side the window of recovery  ESRD (end stage renal disease) on dialysis [facility]) -Appreciate nephrology managing inpatient HD -regular outpatient treatments on MWF -Successfully completed hemodialysis 12/17/2023          Consults called: Neurology/nephrology -------------------------------------------------------------------------------------------------------------------------------------------- DVT prophylaxis:  heparin injection 5,000 Units Start: 12/18/23 1400 SCD's Start: 12/18/23 1218   Code Status:   Code Status: Full Code   Admission status: Patient will be admitted as Inpatient, with a greater than 2 midnight length of stay. Level of care: Telemetry   Family Communication:  none at bedside  (The above findings and plan  of care has been discussed with patient in detail, the patient expressed understanding and agreement of above plan)  --------------------------------------------------------------------------------------------------------------------------------------------------  Disposition Plan:  Anticipated 1-2 days Status is: Inpatient Remains inpatient appropriate because: Needing stroke workup for acute stroke  ----------------------------------------------------------------------------------------------------------------------------------------------------  Time spent:  89  Min.  Was spent seeing and evaluating the patient, reviewing all medical records, drawn plan of care.  SIGNED: Kendell Bane, MD, FHM. FAAFP. [facility], Pager  (Please use [email or web address] to page/ or secure chat through epic) If 7PM-7AM, please contact night-coverage [email or web address],  12/18/2023, 12:54 PM

## 2023-12-18 NOTE — Assessment & Plan Note (Signed)
-   Monitoring H&H, remained stable

## 2023-12-18 NOTE — Assessment & Plan Note (Signed)
-   Consulting PT/OT for evaluation recommendations

## 2023-12-18 NOTE — Assessment & Plan Note (Signed)
-   Checking hemoglobin A1c -Will check CBG q. ACH S with Isai coverage Continue long-acting insulin -Last A1c 5.6

## 2023-12-18 NOTE — Assessment & Plan Note (Addendum)
-   Monitoring urine output,  -Continue Flomax

## 2023-12-18 NOTE — Assessment & Plan Note (Addendum)
-  Appreciate nephrology managing inpatient HD -regular outpatient treatments on MWF -Successfully completed hemodialysis 12/17/2023

## 2023-12-18 NOTE — Assessment & Plan Note (Signed)
-   With history of increased intraocular pressure,  - Continue the use of latanoprost and also dorzolamide/timolol. -Continue outpatient follow-up with ophthalmology service.

## 2023-12-18 NOTE — ED Triage Notes (Signed)
 BIB EMS for weakness. States he was using walker at home but now not enough strength for even that. States he's a newer dialysis pt. Dialysis M,W,F. Completed tx yesterday. CBG 123.

## 2023-12-18 NOTE — Progress Notes (Addendum)
 SLP Cancellation Note  Patient Details Name: Philip Benjamin MRN: [id number] DOB: [birth year]   Cancelled treatment:       Reason Eval/Treat Not Completed: Other (comment) (Pt still in ED. SLE requested, however will defer to tomorrow to see if changes over night. Neurology noted positive stroke, however likely asymptomatic. Pt is NPO, however Pt passed the [employer] Screen earlier today.  Thank you,  Havery Moros, CCC-SLP [phone]  [name] 12/18/2023, 3:41 PM

## 2023-12-18 NOTE — Assessment & Plan Note (Addendum)
-   Currently on metoprolol-holding  to allow permissive hypertension- Although patient is about side the window of recovery

## 2023-12-18 NOTE — Assessment & Plan Note (Signed)
-  Admit admission to telemetry for close observation, -Continue neurochecks -MRI of the brain reviewed: -Obtaining MRA, 2D echocardiogram, bilateral carotid studies -Consulting neurology for further evaluation recommendation -Continue high-dose statins, aspirin, Plavix -We will holding BP meds, allowing for permissive hypertension -Consulting PT/OT/speech -Following up with labs-including lipid panel,  MRI of the brain: IMPRESSION: 1. 3 mm acute infarct within the left lentiform nucleus. 2. Background parenchymal atrophy, advanced chronic small vessel ischemic disease and chronic infarcts, as detailed. 3. Several nonspecific chronic microhemorrhages scattered within the supratentorial and infratentorial brain. 4. Small-volume fluid within bilateral mastoid air cells. 5. Asymmetric T2 hyperintense signal within the right petrous apex, potentially reflecting trapped fluid.

## 2023-12-19 ENCOUNTER — Inpatient Hospital Stay (HOSPITAL_COMMUNITY): Payer: Medicare Other

## 2023-12-19 DIAGNOSIS — I639 Cerebral infarction, unspecified: Secondary | ICD-10-CM | POA: Diagnosis not present

## 2023-12-19 DIAGNOSIS — I6389 Other cerebral infarction: Secondary | ICD-10-CM

## 2023-12-19 LAB — GLUCOSE, CAPILLARY
Glucose-Capillary: 115 mg/dL — ABNORMAL HIGH (ref 70–99)
Glucose-Capillary: 178 mg/dL — ABNORMAL HIGH (ref 70–99)
Glucose-Capillary: 170 mg/dL — ABNORMAL HIGH (ref 70–99)
Glucose-Capillary: 159 mg/dL — ABNORMAL HIGH (ref 70–99)
Glucose-Capillary: 238 mg/dL — ABNORMAL HIGH (ref 70–99)

## 2023-12-19 LAB — HEPATITIS B SURFACE ANTIGEN: Hepatitis B Surface Ag: NONREACTIVE

## 2023-12-19 LAB — ECHOCARDIOGRAM COMPLETE
AR max vel: 3.56 cm2
AV Area VTI: 3.29 cm2
AV Area mean vel: 3.16 cm2
AV Mean grad: 6 mm[Hg]
AV Peak grad: 10.1 mm[Hg]
Ao pk vel: 1.59 m/s
Area-P 1/2: 3.31 cm2
Height: 73 in
S' Lateral: 2.6 cm
Weight: 3040 [oz_av]

## 2023-12-19 MED ORDER — ALTEPLASE 2 MG IJ SOLR: 2.0000 mg | Freq: Once | INTRAMUSCULAR | Status: DC | PRN

## 2023-12-19 MED ORDER — HEPARIN SODIUM (PORCINE) 1000 UNIT/ML IJ SOLN
INTRAMUSCULAR | Status: AC
  Filled 2023-12-19: qty 4

## 2023-12-19 MED ORDER — PERFLUTREN LIPID MICROSPHERE
1.0000 mL | INTRAVENOUS | Status: AC | PRN
Start: 2023-12-19 — End: 2023-12-19
  Administered 2023-12-19: 3 mL via INTRAVENOUS
  Filled 2023-12-19: qty 10

## 2023-12-19 MED ORDER — CHLORHEXIDINE GLUCONATE CLOTH 2 % EX PADS
6.0000 | MEDICATED_PAD | Freq: Every day | CUTANEOUS | Status: DC
  Administered 2023-12-19 – 2023-12-23 (×5): 6 via TOPICAL

## 2023-12-19 MED ORDER — DEXAMETHASONE 4 MG PO TABS
4.0000 mg | ORAL_TABLET | Freq: Three times a day (TID) | ORAL | Status: DC
  Administered 2023-12-19 – 2023-12-21 (×6): 4 mg via ORAL
  Filled 2023-12-19 (×6): qty 1

## 2023-12-19 MED ORDER — HEPARIN SODIUM (PORCINE) 1000 UNIT/ML DIALYSIS: 1000.0000 [IU] | INTRAMUSCULAR | Status: DC | PRN

## 2023-12-19 MED ORDER — ANTICOAGULANT SODIUM CITRATE 4% (200MG/5ML) IV SOLN: 5.0000 mL | Status: DC | PRN

## 2023-12-19 NOTE — Plan of Care (Signed)
  Problem: Acute Rehab PT Goals(only PT should resolve) Goal: Pt Will Go Supine/Side To Sit Outcome: Progressing Flowsheets (Taken 12/19/2023 1136) Pt will go Supine/Side to Sit: with supervision Goal: Patient Will Transfer Sit To/From Stand Outcome: Progressing Flowsheets (Taken 12/19/2023 1136) Patient will transfer sit to/from stand: with minimal assist Goal: Pt Will Transfer Bed To Chair/Chair To Bed Outcome: Progressing Flowsheets (Taken 12/19/2023 1136) Pt will Transfer Bed to Chair/Chair to Bed:  with min assist  with mod assist Goal: Pt Will Ambulate Outcome: Progressing Flowsheets (Taken 12/19/2023 1136) Pt will Ambulate:  15 feet  with moderate assist  with rolling walker    Philip Benjamin SPT

## 2023-12-19 NOTE — Progress Notes (Signed)
*  PRELIMINARY RESULTS* Echocardiogram 2D Echocardiogram has been performed with [facility].  Stacey Drain 12/19/2023, 1:20 PM

## 2023-12-19 NOTE — Evaluation (Signed)
 Occupational Therapy Evaluation Patient Details Name: Philip Benjamin MRN: [id number] DOB: 12-Aug-1951 Today's Date: 12/19/2023   History of Present Illness   Philip Benjamin is a 73-year-old male with extensive history of DM2, HTN, HLD,ESRD on HD presenting with chief complaint of weakness.  Patient reports right-sided weakness in the past 48 hours, right upper and lower extremity.  No change in speech or visual deficits.    States that his weakness, symptoms are gradually worsened but not significantly, no improvement.  He has completed dialysis yesterday (per MD)     Clinical Impressions Pt agreeable to OT and PT co-evaluation. Pt reports being back form rehab for ~2 weeks. Pt reported decrease in function over the past two weeks. Prior to decrease in function the pt was able to ambulate with RW. Daughter assist with bathing, at times dressing, and toileting. Pt lives with his wife who is present 24/7 but not physically able to help. Daughter works and is available PRN. Today the pt required CGA for bed mobility and mod to max A for transfer to the chair and ambulation in the room with RW. Pt reports L UE deficits in fine motor skills at baseline. B UE limited in A/ROM and strength at the shoulder. Mild increased weakness in L wrist and definite weakness in L grasp. Poor gross motor coordination/proprioception noted in R and L UE. Limited vision at baseline. Able to dress lower body with mod A per clinical judgement of pt attempting to doff and don sock. Pt left in the chair with call bell within reach. Pt will benefit from continued OT in the hospital and recommended venue below to increase strength, balance, and endurance for safe ADL's.        If plan is discharge home, recommend the following:   A lot of help with walking and/or transfers;A lot of help with bathing/dressing/bathroom;Assistance with cooking/housework;Assist for transportation;Help with stairs or ramp for entrance      Functional Status Assessment   Patient has had a recent decline in their functional status and demonstrates the ability to make significant improvements in function in a reasonable and predictable amount of time.     Equipment Recommendations   None recommended by OT     Recommendations for Other Services   Rehab consult     Precautions/Restrictions   Precautions Precautions: Fall Recall of Precautions/Restrictions: Intact Restrictions Weight Bearing Restrictions Per Provider Order: No     Mobility Bed Mobility Overal bed mobility: Needs Assistance Bed Mobility: Supine to Sit     Supine to sit: Contact guard     General bed mobility comments: slow labored movement    Transfers Overall transfer level: Needs assistance Equipment used: Rolling walker (2 wheels) Transfers: Sit to/from Stand, Bed to chair/wheelchair/BSC Sit to Stand: Mod assist     Step pivot transfers: Mod assist, Max assist     General transfer comment: labored movement; poor weight bearing through R LE      Balance Overall balance assessment: Needs assistance Sitting-balance support: Feet supported, No upper extremity supported Sitting balance-Leahy Scale: Poor Sitting balance - Comments: poor to fair Postural control: Posterior lean Standing balance support: Bilateral upper extremity supported, During functional activity, Reliant on assistive device for balance Standing balance-Leahy Scale: Poor Standing balance comment: using RW                           ADL either performed or assessed with clinical judgement  ADL Overall ADL's : Needs assistance/impaired Eating/Feeding: Sitting;Set up   Grooming: Moderate assistance;Sitting   Upper Body Bathing: Moderate assistance;Sitting   Lower Body Bathing: Moderate assistance;Sitting/lateral leans   Upper Body Dressing : Moderate assistance;Sitting   Lower Body Dressing: Moderate assistance;Sitting/lateral leans    Toilet Transfer: Moderate assistance;Maximal assistance;Stand-pivot;Ambulation;Rolling walker (2 wheels) Toilet Transfer Details (indicate cue type and reason): Simulated via EOB to chair transfer and brief ambulation in the room. Toileting- Clothing Manipulation and Hygiene: Moderate assistance;Sitting/lateral lean   Tub/ Shower Transfer: Moderate assistance;Maximal assistance;Rolling walker (2 wheels);Stand-pivot   Functional mobility during ADLs: Moderate assistance;Maximal assistance;Rolling walker (2 wheels) General ADL Comments: Able to ambulate ~10 to 15 feet in total from the chair and back with RW.     Vision Baseline Vision/History: 1 Wears glasses;3 Music therapist blind Ability to See in Adequate Light: 3 Highly impaired Patient Visual Report: No change from baseline Vision Assessment?: Yes Tracking/Visual Pursuits: Able to track stimulus in all quads without difficulty Depth Perception:  (Pt reports poor depth perception at baseline.) Additional Comments: Many baseline deficits.     Perception Perception: Not tested       Praxis Praxis: Not tested       Pertinent Vitals/Pain Pain Assessment Pain Assessment: Faces Faces Pain Scale: Hurts little more Pain Location: R flank pain; R shoulder Pain Descriptors / Indicators: Grimacing, Guarding Pain Intervention(s): Monitored during session, Limited activity within patient's tolerance, Repositioned     Extremity/Trunk Assessment Upper Extremity Assessment Upper Extremity Assessment: RUE deficits/detail;LUE deficits/detail RUE Deficits / Details: 2+/5 bilateral shoulder flexion and abduction; 5/5 elbow flexion and extension; 4/5 wrist flexion and extension, 4-/5 grip. RUE Sensation: decreased light touch;decreased proprioception RUE Coordination: decreased fine motor;decreased gross motor LUE Deficits / Details: 2+/5 shoulder flexion and abduction; generally weak othrwise but noted poor propriocepition for finger to  nose test. LUE Sensation: decreased proprioception LUE Coordination: decreased gross motor   Lower Extremity Assessment Lower Extremity Assessment: Defer to PT evaluation   Cervical / Trunk Assessment Cervical / Trunk Assessment: Kyphotic   Communication Communication Communication: No apparent difficulties   Cognition Arousal: Alert Behavior During Therapy: WFL for tasks assessed/performed Cognition: No apparent impairments                               Following commands: Intact       Cueing  General Comments   Cueing Techniques: Verbal cues                 Home Living Family/patient expects to be discharged to:: Private residence Living Arrangements: Spouse/significant other;Children Available Help at Discharge: Family;Available PRN/intermittently Type of Home: House Home Access: Stairs to enter [employer] of Steps: 5 Entrance Stairs-Rails: None Home Layout: Two level;Laundry or work area in basement;Able to live on main level with bedroom/bathroom;Full bath on main level Alternate Level Stairs-Number of Steps: 5 Alternate Level Stairs-Rails: Right Bathroom Shower/Tub: Chief Strategy Officer: Standard Bathroom Accessibility: Yes How Accessible: Accessible via walker Home Equipment: Rolling Walker (2 wheels);Shower seat;Hand held shower head;BSC/3in1          Prior Functioning/Environment Prior Level of Function : Needs assist       Physical Assist : ADLs (physical)   ADLs (physical): Dressing;Bathing;Toileting;IADLs Mobility Comments: houshold ambulatory with RW, but decreased ability in the last 2 weeks. ADLs Comments: Assist for bathing, dressing, toileting. Able to groom and feed.    OT Problem List: Decreased strength;Decreased  range of motion;Decreased activity tolerance;Impaired balance (sitting and/or standing);Impaired vision/perception;Decreased coordination;Impaired UE functional use   OT  Treatment/Interventions: Self-care/ADL training;Therapeutic exercise;Neuromuscular education;DME and/or AE instruction;Therapeutic activities;Visual/perceptual remediation/compensation;Patient/family education;Balance training      OT Goals(Current goals can be found in the care plan section)   Acute Rehab OT Goals Patient Stated Goal: improve function OT Goal Formulation: With patient Time For Goal Achievement: 01/02/24 Potential to Achieve Goals: Good   OT Frequency:  Min 2X/week    Co-evaluation PT/OT/SLP Co-Evaluation/Treatment: Yes Reason for Co-Treatment: To address functional/ADL transfers   OT goals addressed during session: ADL's and self-care                       End of Session Equipment Utilized During Treatment: Rolling walker (2 wheels);Gait belt  Activity Tolerance: Patient tolerated treatment well Patient left: in chair;with call bell/phone within reach  OT Visit Diagnosis: Unsteadiness on feet (R26.81);Other abnormalities of gait and mobility (R26.89);Muscle weakness (generalized) (M62.81);History of falling (Z91.81);Other symptoms and signs involving the nervous system (W09.811)                Time: 9147-8295 OT Time Calculation (min): 26 min Charges:  OT General Charges $OT Visit: 1 Visit OT Evaluation $OT Eval Low Complexity: 1 Low  Yaniv Lage OT, MOT   Danie Chandler 12/19/2023, 11:15 AM

## 2023-12-19 NOTE — Progress Notes (Signed)
 SLP Cancellation Note  Patient Details Name: Philip Benjamin MRN: [id number] DOB: 1951-01-21   Cancelled treatment:       Reason Eval/Treat Not Completed: SLP screened, no needs identified, will sign off. Pt's speech, language and cognition are functioning at baseline. Thank you for this referral,  Philip Benjamin, CCC-SLP Speech Language Pathologist    Philip Benjamin 12/19/2023, 3:40 PM

## 2023-12-19 NOTE — Progress Notes (Signed)
 PROGRESS NOTE    Patient: Philip Benjamin                            PCP: Kirstie Peri, MD                    DOB: Jul 08, 1951            DOA: 12/18/2023 ZOX:[id number]             DOS: 12/19/2023, 10:46 AM   LOS: 1 day   Date of Service: The patient was seen and examined on 12/19/2023  Subjective:   The patient was seen and examined this morning. Hemodynamically stable. For exam improvement right upper extremity strength, no changes in the right lower extremity, difficulty ambulating-which is new for him.  Brief Narrative:   Philip Benjamin is a 73-year-old male with extensive history of DM2, HTN, HLD,ESRD on HD presenting with chief complaint of weakness.  Patient reports right-sided weakness in the past 48 hours, right upper and lower extremity.  No change in speech or visual deficits.   States that his weakness, symptoms are gradually worsened but not significantly, no improvement. He has completed dialysis yesterday   ED: Blood pressure 115/76, pulse 63, temperature 98.2 F (36.8 C), RR 15, height 6\' 1"  (1.854 m), weight 86.2 kg, SpO2 100%.   Labs-reviewed CBC CMP, creatinine 3.02, troponin 52, 52, hemoglobin 5.6, hematocrit 10.0, Respiratory panel, influenza A/B, SARS-CoV-2, RSV all negative Chest x-ray within normal limits  MRI of the brain: IMPRESSION: 1. 3 mm acute infarct within the left lentiform nucleus. 2. Background parenchymal atrophy, advanced chronic small vessel ischemic disease and chronic infarcts, as detailed. 3. Several nonspecific chronic microhemorrhages scattered within the supratentorial and infratentorial brain. 4. Small-volume fluid within bilateral mastoid air cells. 5. Asymmetric T2 hyperintense signal within the right petrous apex, potentially reflecting trapped fluid.  EDP requested patient to be admitted for acute stroke workup    Assessment & Plan:   Principal Problem:   Acute stroke due to ischemia [facility]) Active Problems:   T2DM (type  2 diabetes mellitus) (HCC)   BPH (benign prostatic hyperplasia)   ESRD (end stage renal disease) on dialysis [facility])   Essential hypertension   Mixed hyperlipidemia   Glaucoma   Generalized weakness   Anemia in chronic kidney disease     Assessment and Plan: * Acute stroke due to ischemia (HCC) -Monitoring on telemetry -no signs of arrhythmia -Continue neurochecks, no significant changes -MRI of the brain reviewed: Acute on chronic infarct on the left lentiform nucleus, chronic microhemorrhages scattered within the supratentorial and infratentorial area, -Bilateral carotid studies - negative for any significant stenosis -MRA:No evidence of intracranial large vessel occlusion or high-grade stenosis. 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 Melynda Ripple  and neurosurgery on-call Dr. Wynetta Emery -who recommends surgery in the future-close follow-up. Dr. Wynetta Emery recommends follow-up steroids  No intervention recommended at this point  - Echo: pending    -Neurology: Recommended continue high-dose statin, aspirin -Continue high-dose statins, aspirin,  -We will holding BP meds, allowing for permissive hypertension -Consulting PT/OT/ - S/p Speecheval: -Passed swallow evaluation , -SAR labs within normal limits LDL 28, folate 32.4, B12 690, TSH 2.5,    T2DM (type 2 diabetes mellitus) (HCC) - Checking hemoglobin A1c -Will check CBG q. ACH S with Isai coverage Continue long-acting insulin -Last A1c 5.6  BPH (benign prostatic hyperplasia) -  Monitoring urine output,  -Continue Flomax  Anemia in chronic kidney disease Monitoring H&H, remained stable  Generalized weakness - Consulting PT OT for evaluation recommendations   Glaucoma - With history of increased intraocular pressure,  - Continue the use of latanoprost and also dorzolamide/timolol. -Continue outpatient follow-up with ophthalmology service.  Mixed hyperlipidemia - Initiating  high-dose statins -Follow-up with lipid panel, with LDL goal of less than 70 LDL 28  Essential hypertension - Currently on metoprolol-will be continued  ESRD (end stage renal disease) on dialysis [facility]) -Appreciate nephrology managing inpatient HD -regular outpatient treatments on MWF -Successfully completed hemodialysis 12/17/2023    --------------------------------------------------------------------------------------------------------------------------------- Nutritional status:  The patient's BMI is: Body mass index is 25.07 kg/m. I agree with the assessment and plan as outlined  ------------------------------------------------------------------------------------------------------------------------------------------------  DVT prophylaxis:  heparin injection 5,000 Units Start: 12/18/23 1400 SCD's Start: 12/18/23 1218   Code Status:   Code Status: Full Code  Family Communication: No family member present at bedside- -Advance care planning has been discussed.   Admission status:   Status is: Inpatient Remains inpatient appropriate because: Continue stroke workup   Disposition: From  - home             Planning for discharge next 24 hours either home with home health or SNF-  Patient  is agreeable Procedures:   No admission procedures for hospital encounter.   Antimicrobials:  Anti-infectives (From admission, onward)    None        Medication:    stroke: early stages of recovery book   Does not apply Once   aspirin  81 mg Oral Daily   atorvastatin  80 mg Oral Daily   Chlorhexidine Gluconate Cloth  6 each Topical Q0600   cycloSPORINE  1 drop Both Eyes BID   dorzolamide-timolol  1 drop Both Eyes BID   FLUoxetine  20 mg Oral Daily   heparin  5,000 Units Subcutaneous Q8H   insulin aspart  0-6 Units Subcutaneous TID WC   insulin glargine-yfgn  26 Units Subcutaneous Daily   latanoprost  1 drop Left Eye QHS   multivitamin  1 tablet Oral QHS   tamsulosin  0.4 mg  Oral Q supper    acetaminophen **OR** acetaminophen (TYLENOL) oral liquid 160 mg/5 mL **OR** acetaminophen, ALPRAZolam, senna-docusate   Objective:   Vitals:   12/18/23 2126 12/19/23 0559 12/19/23 0715 12/19/23 0800  BP: 121/61 132/66    Pulse: 72 62    Resp: 17 19 (!) 0 15  Temp: 98.4 F (36.9 C) 98.5 F (36.9 C)    TempSrc: Oral Oral    SpO2:  99%    Weight:      Height:        Intake/Output Summary (Last 24 hours) at 12/19/2023 1046 Last data filed at 12/19/2023 0943 Gross per 24 hour  Intake 660 ml  Output --  Net 660 ml   Filed Weights   12/18/23 0850  Weight: 86.2 kg     Physical examination:        General:  AAO x 3,  cooperative, no distress;   HEENT:  Normocephalic, PERRL, otherwise with in Normal limits   Neuro:  Right-sided weakness,  CNII-XII intact. , normal sensation, reflexes intact   Lungs:   Clear to auscultation BL, Respirations unlabored,  No wheezes / crackles  Cardio:    S1/S2, RRR, No murmure, No Rubs or Gallops   Abdomen:  Soft, non-tender, bowel sounds active all four quadrants, no guarding or peritoneal signs.  Muscular  skeletal:  Limited exam -global generalized weaknesses Right upper/lower extremity strength 5 - in bed, able to move all 4 extremities,   2+ pulses,  symmetric, No pitting edema  Skin:  Dry, warm to touch, negative for any Rashes,  Wounds: Please see nursing documentation           ------------------------------------------------------------------------------------------------------------------------------------------    LABs:     Latest Ref Rng & Units 12/18/2023    9:22 AM 10/10/2023    4:14 AM 10/09/2023    4:25 AM  CBC  WBC 4.0 - 10.5 K/uL 5.6  7.7  6.8   Hemoglobin 13.0 - 17.0 g/dL 16.1  8.7  8.6   Hematocrit 39.0 - 52.0 % 31.8  28.5  26.5   Platelets 150 - 400 K/uL 129  213  232       Latest Ref Rng & Units 12/18/2023    9:22 AM 10/10/2023    4:14 AM 10/09/2023    4:25 AM  CMP  Glucose 70  - 99 mg/dL 99  97  096   BUN 8 - 23 mg/dL 20  23  18    Creatinine 0.61 - 1.24 mg/dL 0.45  4.09  8.11   Sodium 135 - 145 mmol/L 137  135  136   Potassium 3.5 - 5.1 mmol/L 3.5  3.8  3.4   Chloride 98 - 111 mmol/L 98  100  100   CO2 22 - 32 mmol/L 27  25  27    Calcium 8.9 - 10.3 mg/dL 9.3  8.6  8.4   Total Protein 6.5 - 8.1 g/dL  5.4  5.4   Total Bilirubin <1.2 mg/dL  0.4  0.6   Alkaline Phos 38 - 126 U/L  71  77   AST 15 - 41 U/L  35  58   ALT 0 - 44 U/L  59  76        Micro Results Recent Results (from the past 240 hours)  Resp panel by RT-PCR (RSV, Flu A&B, Covid) Anterior Nasal Swab     Status: None   Collection Time: 12/18/23  9:05 AM   Specimen: Anterior Nasal Swab  Result Value Ref Range Status   SARS Coronavirus 2 by RT PCR NEGATIVE NEGATIVE Final    Comment: (NOTE) SARS-CoV-2 target nucleic acids are NOT DETECTED.  The SARS-CoV-2 RNA is generally detectable in upper respiratory specimens during the acute phase of infection. The lowest concentration of SARS-CoV-2 viral copies this assay can detect is 138 copies/mL. A negative result does not preclude SARS-Cov-2 infection and should not be used as the sole basis for treatment or other patient management decisions. A negative result may occur with  improper specimen collection/handling, submission of specimen other than nasopharyngeal swab, presence of viral mutation(s) within the areas targeted by this assay, and inadequate number of viral copies(<138 copies/mL). A negative result must be combined with clinical observations, patient history, and epidemiological information. The expected result is Negative.  Fact Sheet for Patients:  BloggerCourse.com  Fact Sheet for Healthcare Providers:  SeriousBroker.it  This test is no t yet approved or cleared by the [place] FDA and  has been authorized for detection and/or diagnosis of SARS-CoV-2 by FDA under an Emergency  Use Authorization (EUA). This EUA will remain  in effect (meaning this test can be used) for the duration of the COVID-19 declaration under Section 564(b)(1) of the Act, 21 U.S.C.section 360bbb-3(b)(1), unless the authorization is terminated  or revoked sooner.  Influenza A by PCR NEGATIVE NEGATIVE Final   Influenza B by PCR NEGATIVE NEGATIVE Final    Comment: (NOTE) The Xpert Xpress SARS-CoV-2/FLU/RSV plus assay is intended as an aid in the diagnosis of influenza from Nasopharyngeal swab specimens and should not be used as a sole basis for treatment. Nasal washings and aspirates are unacceptable for Xpert Xpress SARS-CoV-2/FLU/RSV testing.  Fact Sheet for Patients: BloggerCourse.com  Fact Sheet for Healthcare Providers: SeriousBroker.it  This test is not yet approved or cleared by the [place] FDA and has been authorized for detection and/or diagnosis of SARS-CoV-2 by FDA under an Emergency Use Authorization (EUA). This EUA will remain in effect (meaning this test can be used) for the duration of the COVID-19 declaration under Section 564(b)(1) of the Act, 21 U.S.C. section 360bbb-3(b)(1), unless the authorization is terminated or revoked.     Resp Syncytial Virus by PCR NEGATIVE NEGATIVE Final    Comment: (NOTE) Fact Sheet for Patients: BloggerCourse.com  Fact Sheet for Healthcare Providers: SeriousBroker.it  This test is not yet approved or cleared by the [place] FDA and has been authorized for detection and/or diagnosis of SARS-CoV-2 by FDA under an Emergency Use Authorization (EUA). This EUA will remain in effect (meaning this test can be used) for the duration of the COVID-19 declaration under Section 564(b)(1) of the Act, 21 U.S.C. section 360bbb-3(b)(1), unless the authorization is terminated or revoked.  Performed at [facility], [address] [place], [place] [address]     Radiology Reports US Carotid Bilateral (at [facility] and [facility] only) Result Date: 12/18/2023 CLINICAL DATA:  73 year old male with history of stroke. EXAM: BILATERAL CAROTID DUPLEX ULTRASOUND TECHNIQUE: Wallace Cullens scale imaging, color Doppler and duplex ultrasound were performed of bilateral carotid and vertebral arteries in the neck. COMPARISON:  None Available. FINDINGS: Criteria: Quantification of carotid stenosis is based on velocity parameters that correlate the residual internal carotid diameter with NASCET-based stenosis levels, using the diameter of the distal internal carotid lumen as the denominator for stenosis measurement. The following velocity measurements were obtained: RIGHT ICA: Peak systolic velocity 76 cm/sec, End diastolic velocity 21 cm/sec CCA: Peak systolic velocity 102 cm/sec SYSTOLIC ICA/CCA RATIO:  0.7 ECA: Peak systolic velocity 88 cm/sec LEFT ICA: Peak systolic velocity 86 cm/sec, End diastolic velocity 23 cm/sec CCA: 113 cm/sec SYSTOLIC ICA/CCA RATIO:  0.8 ECA: 84 cm/sec RIGHT CAROTID ARTERY: Minimal atherosclerotic plaque formation about the bulb and bifurcation. No significant tortuosity. Normal low resistance waveforms. RIGHT VERTEBRAL ARTERY:  Antegrade flow. LEFT CAROTID ARTERY: Minimal atherosclerotic plaque formation about the bulb and bifurcation. No significant tortuosity. Normal low resistance waveforms. LEFT VERTEBRAL ARTERY:  Antegrade flow. Upper extremity non-invasive blood pressures: Not obtained. IMPRESSION: 1. Right carotid artery system: Patent without significant atherosclerotic plaque formation. 2. Left carotid artery system: Patent without significant atherosclerotic plaque formation. 3.  Vertebral artery system: Patent with antegrade flow bilaterally. Marliss Coots, MD [facility] Radiology Electronically Signed   By: Marliss Coots M.D.   On: 12/18/2023 16:16   MR CERVICAL SPINE WO  CONTRAST Result Date: 12/18/2023 CLINICAL DATA:  Cervical spinal stenosis. EXAM: MRI CERVICAL SPINE WITHOUT CONTRAST TECHNIQUE: Multiplanar, multisequence MR imaging of the cervical spine was performed. No intravenous contrast was administered. COMPARISON:  None Available. FINDINGS: Alignment: Cervical lordosis is maintained. There is mild dextrocurvature of the cervical spine. No significant listhesis. Vertebrae: There is partial fusion of the C2 and C3 vertebral bodies. Associated signal abnormality at this level likely reflecting Modic type  2 degenerative endplate changes. Additional Modic type 2 degenerative endplate changes at C6-7. No bone marrow edema in the vertebral bodies or evidence of acute fracture. Cord: Cord compression noted at the C3-4 level with subtle increased STIR signal which may reflect myelomalacia versus edema. Cord signal otherwise unremarkable. Posterior Fossa, vertebral arteries, paraspinal tissues: The visualized posterior fossa structures are unremarkable. There is prevertebral edema from the C3-4 level to the upper thoracic spine possibly related to prominent endplate osteophytes. No findings to suggest anterior longitudinal ligament disruption. There is additional edema within the paraspinal musculature of the mid and lower cervical spine more pronounced on the right. Edema involves the interspinous ligaments from C3-4 to C7-T1 suggesting ligament strain. 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 C2-3: Fusion at this level. No significant spinal canal stenosis. Bilateral facet arthrosis. Mild bilateral foraminal stenosis. C3-4: Disc osteophyte complex which abuts the ventral cervical cord. Thickening of the ligamentum flavum abuts the dorsal cord. Severe spinal canal stenosis and cord compression at this level. Uncovertebral hypertrophy and facet arthrosis. Moderate to severe bilateral foraminal stenosis. C4-5: Mild  disc height loss. Disc osteophyte complex slightly eccentric to the left contacts the ventral cervical cord. No cord signal abnormality. Thickening of the ligamentum flavum. Moderate spinal canal stenosis. Bilateral facet arthrosis and uncovertebral hypertrophy. Moderate foraminal narrowing on the right. Severe foraminal narrowing on the left with some contribution from the disc osteophyte complex. C5-6: Disc height loss. Disc osteophyte complex abuts the ventral cord. Thickening of the ligamentum flavum abuts the dorsal cord on the right. Moderate spinal canal stenosis. Subtle cord compression without cord signal abnormality. Bilateral facet arthrosis and uncovertebral hypertrophy. Moderate to severe right and severe left foraminal stenosis. C6-7: Disc height loss. Disc osteophyte complex abuts the ventral cord. Thickening of the ligamentum flavum abuts the dorsal cord. Moderate spinal canal stenosis. Bilateral uncovertebral hypertrophy and facet arthrosis resulting in moderate to severe bilateral foraminal stenosis. C7-T1: Disc bulge which indents the ventral thecal sac without contacting the spinal cord. Thickening of the ligamentum flavum abuts the dorsal thecal sac more pronounced on the left. Mild-to-moderate spinal canal stenosis. Bilateral facet arthrosis. Mild-to-moderate right and moderate left foraminal stenosis. IMPRESSION: 1. 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 Findings are likely chronic and degenerative in etiology. No findings to suggest acute disc herniation. 2. 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 Moderate spinal canal stenosis at C4-5 through C6-7. 3. Multilevel foraminal stenosis, greatest and severe on the left at C4-5 and C5-6. Additional moderate to severe foraminal stenosis at multiple levels. 4. Prevertebral edema from C3-4 to the upper thoracic spine possibly related to  prominent endplate osteophytes. No evidence of anterior longitudinal ligament disruption. 5. Edema of interspinous ligaments at multiple levels suggesting ligament strain. Electronically Signed   By: Emily Filbert M.D.   On: 12/18/2023 15:26   MR ANGIO HEAD WO CONTRAST Result Date: 12/18/2023 CLINICAL DATA:  Stroke follow-up EXAM: MRA HEAD WITHOUT CONTRAST TECHNIQUE: Angiographic images of the Circle of Willis were acquired using MRA technique without intravenous contrast. COMPARISON:  Same day MRI head.  CT head 05/20/2023. FINDINGS: Anterior circulation: The bilateral intracranial ICAs are patent. There is mild narrowing of the bilateral supraclinoid ICAs slightly greater on the right, likely related to atherosclerosis. The right M1 segment and MCA bifurcation are patent. Distal right MCA branches are patent. The left M1 segment and left MCA bifurcation are patent.  Distal left MCA branches are patent. A1 A2 segments are patent bilaterally. Distal ACA branches are patent. Posterior circulation: The visualized intracranial vertebral arteries are patent. Basilar artery is patent. Small bilateral posterior communicating arteries noted. The bilateral PCAs are patent. SCAs and AICAs are visualized bilaterally. Anatomic variants: None Other: None. IMPRESSION: 1. No evidence of intracranial large vessel occlusion or high-grade stenosis. 2. Mild narrowing of the bilateral supraclinoid ICAs slightly greater on the right, likely related to atherosclerosis. Electronically Signed   By: Emily Filbert M.D.   On: 12/18/2023 15:11    SIGNED: Kendell Bane, MD, FHM. FAAFP. [facility] - [facility] Time spent - 55 min.  In seeing, evaluating and examining the patient. Reviewing medical records, labs, drawn plan of care. [facility],  Pager (please use [email or web address] to page/ text) Please use Epic Secure Chat for non-urgent communication (7AM-7PM)  If 7PM-7AM, please contact  night-coverage [email or web address], 12/19/2023, 10:46 AM

## 2023-12-19 NOTE — Procedures (Signed)
   HEMODIALYSIS TREATMENT NOTE:  Pre-HD: RIJ TDC exit site is dressed with gauze and paper tape.  Removal of this dressing (which was present on admission) reveals non-tender exit site without drainage.  The skin around catheter site, however, has crusty pustules with yellow/green drainage.  Skin tear was also present.     Media Information   Document Information  Photos  [facility] [place] site 12/19/23  12/19/2023 16:46  Attached To:  Hospital Encounter on 12/18/23  Source Information  Lakrista Scaduto, Lynford Humphrey, RN  [facility]Dept 300  Document History    Pt reports he is having an "allergic reaction" to either the dressing or disinfectant used at outpatient center.  Above image was seen by on-call nephrologist Dr. Malen Gauze.  Skin surrounding site was cleaned with Chlorhexidine and dressed with xeroform under sterile gauze.  3.5 hour heparin-free treatment completed. Goal met: 500 ml removed.  Hemodynamically stable.  All blood was returned.  Arman Filter, RN [facility]

## 2023-12-19 NOTE — Consult Note (Addendum)
 Reason for Consult: ESRD Referring Physician: Dr. Flossie Dibble  Chief Complaint: Weakness on the right side  Outpatient HD orders: [facility], MWF. 4 hours. EDW 86.5 kg. TDC (has appt coming up with [facility] on 10/07/23 for perm access consideration). Flow rates: 350/500. 2K, 2.5Ca. Heparin: 1000 units/hr. Meds: Mircera 60 mcg every 4 weeks (last dose 12/08/23), Venofer 50mg  every Monday.  Assessment/Plan: ESRD - HD per MWF schedule; orders written for dialysis today. Will not be physically seeing the patient this weekend unless there are unforeseen changes and will monitor remotely. Dr. Vallery Sa will be covering [facility] this weekend.  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  Patient is at goal.   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  Will check a phos for binder management.  H/O Cholecystitis - S/p Surgery last admission with IR placed cholecystostomy tube and drain on 10/06/23 which has since been removed.  No complaints of fever, chills, nausea, abdominal pain but sometimes his appetite is not great which he believes is because of the cholecystitis.   Disposition - per primary team.  Recently out of rehab and back to home.    HPI: Philip Benjamin is an 73 y.o. male with a history of blindness in the left eye, glaucoma, history of cholecystitis with drain which has since been removed, BPH, diabetes, hypertension, ESRD dialyzed Monday Wednesday Fridays at [facility].  Patient recently went home from rehab but has been getting weaker over the past 2 weeks especially on the right side.  He feels that his right leg is dragging and has difficulty walking.  Patient also has numbness in the upper extremities right > left.  MRI brain showed acute ischemic CVA and seen by neurology.  In the emergency department blood pressure is 115/76, creatinine 3.02, troponin 52 with  weight 86.2 kg.  Patient denies any fever chills nausea vomiting shortness of breath chest pain.  Patient's last dialysis treatment was on Wednesday, full treatment.  ROS Pertinent items are noted in HPI.  Chemistry and CBC: Creatinine, Ser  Date/Time Value Ref Range Status  12/18/2023 09:22 AM 3.02 (H) 0.61 - 1.24 mg/dL Final  52/84/1324 40:10 AM 4.11 (H) 0.61 - 1.24 mg/dL Final  27/25/3664 40:34 AM 3.36 (H) 0.61 - 1.24 mg/dL Final  74/25/9563 87:56 AM 4.52 (H) 0.61 - 1.24 mg/dL Final  43/32/9518 84:16 AM 4.37 (H) 0.61 - 1.24 mg/dL Final  60/63/0160 10:93 AM 3.74 (H) 0.61 - 1.24 mg/dL Final  23/55/7322 02:54 AM 4.63 (H) 0.61 - 1.24 mg/dL Final  27/03/2375 28:31 AM 3.86 (H) 0.61 - 1.24 mg/dL Final  51/76/1607 37:10 PM 5.10 (H) 0.61 - 1.24 mg/dL Final  62/69/4854 62:70 AM 4.77 (H) 0.61 - 1.24 mg/dL Final  35/00/9381 82:99 AM 4.90 (H) 0.61 - 1.24 mg/dL Final  37/16/9678 93:81 AM 4.51 (H) 0.61 - 1.24 mg/dL Final  01/75/1025 85:27 AM 4.46 (H) 0.61 - 1.24 mg/dL Final    Comment:    DELTA CHECK NOTED  08/23/2023 04:25 AM 2.96 (H) 0.61 - 1.24 mg/dL Final  78/24/2353 61:44 AM 3.84 (H) 0.61 - 1.24 mg/dL Final  31/54/0086 76:19 PM 3.71 (H) 0.61 - 1.24 mg/dL Final  50/93/2671 24:58 AM 4.46 (H) 0.61 - 1.24 mg/dL Final  09/98/3382 50:53 AM 3.73 (H) 0.61 - 1.24 mg/dL Final  97/67/3419 37:90 AM 4.96 (H) 0.61 - 1.24  mg/dL Final  16/07/9603 54:09 AM 4.09 (H) 0.61 - 1.24 mg/dL Final  81/19/1478 29:56 AM 4.73 (H) 0.61 - 1.24 mg/dL Final  21/30/8657 84:69 AM 5.75 (H) 0.61 - 1.24 mg/dL Final  62/95/2841 32:44 AM 5.74 (H) 0.61 - 1.24 mg/dL Final  10/23/7251 66:44 AM 1.76 (H) 0.61 - 1.24 mg/dL Final   Recent Labs  Lab 12/18/23 0922  NA 137  K 3.5  CL 98  CO2 27  GLUCOSE 99  BUN 20  CREATININE 3.02*  CALCIUM 9.3   Recent Labs  Lab 12/18/23 0922  WBC 5.6  HGB 10.0*  HCT 31.8*  MCV 96.4  PLT 129*   Liver Function Tests: No results for input(s): "AST", "ALT", "ALKPHOS", "BILITOT",  "PROT", "ALBUMIN" in the last 168 hours. No results for input(s): "LIPASE", "AMYLASE" in the last 168 hours. No results for input(s): "AMMONIA" in the last 168 hours. Cardiac Enzymes: No results for input(s): "CKTOTAL", "CKMB", "CKMBINDEX", "TROPONINI" in the last 168 hours. Iron Studies: No results for input(s): "IRON", "TIBC", "TRANSFERRIN", "FERRITIN" in the last 72 hours. PT/INR: @LABRCNTIP (inr:5)  Xrays/Other Studies: ) Results for orders placed or performed during the hospital encounter of 12/18/23 (from the past 48 hours)  Resp panel by RT-PCR (RSV, Flu A&B, Covid) Anterior Nasal Swab     Status: None   Collection Time: 12/18/23  9:05 AM   Specimen: Anterior Nasal Swab  Result Value Ref Range   SARS Coronavirus 2 by RT PCR NEGATIVE NEGATIVE    Comment: (NOTE) SARS-CoV-2 target nucleic acids are NOT DETECTED.  The SARS-CoV-2 RNA is generally detectable in upper respiratory specimens during the acute phase of infection. The lowest concentration of SARS-CoV-2 viral copies this assay can detect is 138 copies/mL. A negative result does not preclude SARS-Cov-2 infection and should not be used as the sole basis for treatment or other patient management decisions. A negative result may occur with  improper specimen collection/handling, submission of specimen other than nasopharyngeal swab, presence of viral mutation(s) within the areas targeted by this assay, and inadequate number of viral copies(<138 copies/mL). A negative result must be combined with clinical observations, patient history, and epidemiological information. The expected result is Negative.  Fact Sheet for Patients:  BloggerCourse.com  Fact Sheet for Healthcare Providers:  SeriousBroker.it  This test is no t yet approved or cleared by the [place] FDA and  has been authorized for detection and/or diagnosis of SARS-CoV-2 by FDA under an Emergency Use  Authorization (EUA). This EUA will remain  in effect (meaning this test can be used) for the duration of the COVID-19 declaration under Section 564(b)(1) of the Act, 21 U.S.C.section 360bbb-3(b)(1), unless the authorization is terminated  or revoked sooner.       Influenza A by PCR NEGATIVE NEGATIVE   Influenza B by PCR NEGATIVE NEGATIVE    Comment: (NOTE) The Xpert Xpress SARS-CoV-2/FLU/RSV plus assay is intended as an aid in the diagnosis of influenza from Nasopharyngeal swab specimens and should not be used as a sole basis for treatment. Nasal washings and aspirates are unacceptable for Xpert Xpress SARS-CoV-2/FLU/RSV testing.  Fact Sheet for Patients: BloggerCourse.com  Fact Sheet for Healthcare Providers: SeriousBroker.it  This test is not yet approved or cleared by the [place] FDA and has been authorized for detection and/or diagnosis of SARS-CoV-2 by FDA under an Emergency Use Authorization (EUA). This EUA will remain in effect (meaning this test can be used) for the duration of the COVID-19 declaration under Section 564(b)(1) of the Act,  21 U.S.C. section 360bbb-3(b)(1), unless the authorization is terminated or revoked.     Resp Syncytial Virus by PCR NEGATIVE NEGATIVE    Comment: (NOTE) Fact Sheet for Patients: BloggerCourse.com  Fact Sheet for Healthcare Providers: SeriousBroker.it  This test is not yet approved or cleared by the [place] FDA and has been authorized for detection and/or diagnosis of SARS-CoV-2 by FDA under an Emergency Use Authorization (EUA). This EUA will remain in effect (meaning this test can be used) for the duration of the COVID-19 declaration under Section 564(b)(1) of the Act, 21 U.S.C. section 360bbb-3(b)(1), unless the authorization is terminated or revoked.  Performed at [facility], [address] [place], [place]  [address]   Basic metabolic panel     Status: Abnormal   Collection Time: 12/18/23  9:22 AM  Result Value Ref Range   Sodium 137 135 - 145 mmol/L   Potassium 3.5 3.5 - 5.1 mmol/L   Chloride 98 98 - 111 mmol/L   CO2 27 22 - 32 mmol/L   Glucose, Bld 99 70 - 99 mg/dL    Comment: Glucose reference range applies only to samples taken after fasting for at least 8 hours.   BUN 20 8 - 23 mg/dL   Creatinine, Ser 6.04 (H) 0.61 - 1.24 mg/dL   Calcium 9.3 8.9 - 54.0 mg/dL   GFR, Estimated 21 (L) >60 mL/min    Comment: (NOTE) Calculated using the CKD-EPI Creatinine Equation (2021)    Anion gap 12 5 - 15    Comment: Performed at [facility], [address] [place], [place] [address]  CBC     Status: Abnormal   Collection Time: 12/18/23  9:22 AM  Result Value Ref Range   WBC 5.6 4.0 - 10.5 K/uL   RBC 3.30 (L) 4.22 - 5.81 MIL/uL   Hemoglobin 10.0 (L) 13.0 - 17.0 g/dL   HCT 14.7 (L) 82.9 - 56.2 %   MCV 96.4 80.0 - 100.0 fL   MCH 30.3 26.0 - 34.0 pg   MCHC 31.4 30.0 - 36.0 g/dL   RDW 13.0 86.5 - 78.4 %   Platelets 129 (L) 150 - 400 K/uL    Comment: REPEATED TO VERIFY   nRBC 0.0 0.0 - 0.2 %    Comment: Performed at [facility], [address] [place], [place] [address]  Hemoglobin A1c     Status: None   Collection Time: 12/18/23  9:22 AM  Result Value Ref Range   Hgb A1c MFr Bld 5.6 4.8 - 5.6 %    Comment: (NOTE) Pre diabetes:          5.7%-6.4%  Diabetes:              >6.4%  Glycemic control for   <7.0% adults with diabetes    Mean Plasma Glucose 114.02 mg/dL    Comment: Performed at [facility], [address], [place], [place] [address]  Lipid panel     Status: Abnormal   Collection Time: 12/18/23  9:25 AM  Result Value Ref Range   Cholesterol 80 0 - 200 mg/dL   Triglycerides 65 <324 mg/dL   HDL 39 (L) >40 mg/dL   Total CHOL/HDL Ratio 2.1 RATIO   VLDL 13 0 - 40 mg/dL   LDL Cholesterol 28 0 - 99 mg/dL    Comment:        Total Cholesterol/HDL:CHD Risk Coronary Heart  Disease Risk Table  Men   Women  1/2 Average Risk   3.4   3.3  Average Risk       5.0   4.4  2 X Average Risk   9.6   7.1  3 X Average Risk  23.4   11.0        Use the calculated Patient Ratio above and the CHD Risk Table to determine the patient's CHD Risk.        ATP III CLASSIFICATION (LDL):  <100     mg/dL   Optimal  130-865  mg/dL   Near or Above                    Optimal  130-159  mg/dL   Borderline  784-696  mg/dL   High  >295     mg/dL   Very High Performed at [facility], [address], [place], [place] [address]   Troponin I (High Sensitivity)     Status: Abnormal   Collection Time: 12/18/23  9:31 AM  Result Value Ref Range   Troponin I (High Sensitivity) 52 (H) <18 ng/L    Comment: (NOTE) Elevated high sensitivity troponin I (hsTnI) values and significant  changes across serial measurements may suggest ACS but many other  chronic and acute conditions are known to elevate hsTnI results.  Refer to the "Links" section for chest pain algorithms and additional  guidance. Performed at [facility], [address], [place], [place] [address]   Troponin I (High Sensitivity)     Status: Abnormal   Collection Time: 12/18/23 10:57 AM  Result Value Ref Range   Troponin I (High Sensitivity) 52 (H) <18 ng/L    Comment: (NOTE) Elevated high sensitivity troponin I (hsTnI) values and significant  changes across serial measurements may suggest ACS but many other  chronic and acute conditions are known to elevate hsTnI results.  Refer to the "Links" section for chest pain algorithms and additional  guidance. Performed at [facility], [address], [place], [place] [address]   Vitamin B12     Status: None   Collection Time: 12/18/23 10:57 AM  Result Value Ref Range   Vitamin B-12 690 180 - 914 pg/mL    Comment: (NOTE) This assay is not validated for testing neonatal or myeloproliferative syndrome specimens for Vitamin B12 levels. Performed at [facility], [address] [place], [place] [address]   Folate     Status: None   Collection Time: 12/18/23 10:57 AM  Result Value Ref Range   Folate 32.4 >5.9 ng/mL    Comment: RESULTS CONFIRMED BY MANUAL DILUTION Performed at [facility], [address] [place], [place] [address]   TSH     Status: None   Collection Time: 12/18/23 10:57 AM  Result Value Ref Range   TSH 2.569 0.350 - 4.500 uIU/mL    Comment: Performed by a 3rd Generation assay with a functional sensitivity of <=0.01 uIU/mL. Performed at [facility], [address] [place], [place] [address]   CBG monitoring, ED     Status: None   Collection Time: 12/18/23  5:06 PM  Result Value Ref Range   Glucose-Capillary 85 70 - 99 mg/dL    Comment: Glucose reference range applies only to samples taken after fasting for at least 8 hours.  Glucose, capillary     Status: Abnormal   Collection Time: 12/18/23  9:21 PM  Result Value Ref Range   Glucose-Capillary 220 (H) 70 - 99 mg/dL  Comment: Glucose reference range applies only to samples taken after fasting for at least 8 hours.  Glucose, capillary     Status: Abnormal   Collection Time: 12/19/23  8:05 AM  Result Value Ref Range   Glucose-Capillary 115 (H) 70 - 99 mg/dL    Comment: Glucose reference range applies only to samples taken after fasting for at least 8 hours.   US Carotid Bilateral (at [facility] and [facility] only) Result Date: 12/18/2023 CLINICAL DATA:  73 year old male with history of stroke. EXAM: BILATERAL CAROTID DUPLEX ULTRASOUND TECHNIQUE: Wallace Cullens scale imaging, color Doppler and duplex ultrasound were performed of bilateral carotid and vertebral arteries in the neck. COMPARISON:  None Available. FINDINGS: Criteria: Quantification of carotid stenosis is based on velocity parameters that correlate the residual internal carotid diameter with NASCET-based stenosis levels, using the diameter of the distal internal carotid lumen as the denominator for stenosis measurement. The following  velocity measurements were obtained: RIGHT ICA: Peak systolic velocity 76 cm/sec, End diastolic velocity 21 cm/sec CCA: Peak systolic velocity 102 cm/sec SYSTOLIC ICA/CCA RATIO:  0.7 ECA: Peak systolic velocity 88 cm/sec LEFT ICA: Peak systolic velocity 86 cm/sec, End diastolic velocity 23 cm/sec CCA: 113 cm/sec SYSTOLIC ICA/CCA RATIO:  0.8 ECA: 84 cm/sec RIGHT CAROTID ARTERY: Minimal atherosclerotic plaque formation about the bulb and bifurcation. No significant tortuosity. Normal low resistance waveforms. RIGHT VERTEBRAL ARTERY:  Antegrade flow. LEFT CAROTID ARTERY: Minimal atherosclerotic plaque formation about the bulb and bifurcation. No significant tortuosity. Normal low resistance waveforms. LEFT VERTEBRAL ARTERY:  Antegrade flow. Upper extremity non-invasive blood pressures: Not obtained. IMPRESSION: 1. Right carotid artery system: Patent without significant atherosclerotic plaque formation. 2. Left carotid artery system: Patent without significant atherosclerotic plaque formation. 3.  Vertebral artery system: Patent with antegrade flow bilaterally. Marliss Coots, MD [facility] Electronically Signed   By: Marliss Coots M.D.   On: 12/18/2023 16:16   MR CERVICAL SPINE WO CONTRAST Result Date: 12/18/2023 CLINICAL DATA:  Cervical spinal stenosis. EXAM: MRI CERVICAL SPINE WITHOUT CONTRAST TECHNIQUE: Multiplanar, multisequence MR imaging of the cervical spine was performed. No intravenous contrast was administered. COMPARISON:  None Available. FINDINGS: Alignment: Cervical lordosis is maintained. There is mild dextrocurvature of the cervical spine. No significant listhesis. Vertebrae: There is partial fusion of the C2 and C3 vertebral bodies. Associated signal abnormality at this level likely reflecting Modic type 2 degenerative endplate changes. Additional Modic type 2 degenerative endplate changes at C6-7. No bone marrow edema in the vertebral bodies  or evidence of acute fracture. Cord: Cord compression noted at the C3-4 level with subtle increased STIR signal which may reflect myelomalacia versus edema. Cord signal otherwise unremarkable. Posterior Fossa, vertebral arteries, paraspinal tissues: The visualized posterior fossa structures are unremarkable. There is prevertebral edema from the C3-4 level to the upper thoracic spine possibly related to prominent endplate osteophytes. No findings to suggest anterior longitudinal ligament disruption. There is additional edema within the paraspinal musculature of the mid and lower cervical spine more pronounced on the right. Edema involves the interspinous ligaments from C3-4 to C7-T1 suggesting ligament strain. 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 C2-3: Fusion at this level. No significant spinal canal stenosis. Bilateral facet arthrosis. Mild bilateral foraminal stenosis. C3-4: Disc osteophyte complex which abuts the ventral cervical cord. Thickening of the ligamentum flavum abuts the dorsal cord. Severe spinal canal stenosis and cord compression at this level. Uncovertebral hypertrophy and facet arthrosis. Moderate to severe bilateral foraminal stenosis.  C4-5: Mild disc height loss. Disc osteophyte complex slightly eccentric to the left contacts the ventral cervical cord. No cord signal abnormality. Thickening of the ligamentum flavum. Moderate spinal canal stenosis. Bilateral facet arthrosis and uncovertebral hypertrophy. Moderate foraminal narrowing on the right. Severe foraminal narrowing on the left with some contribution from the disc osteophyte complex. C5-6: Disc height loss. Disc osteophyte complex abuts the ventral cord. Thickening of the ligamentum flavum abuts the dorsal cord on the right. Moderate spinal canal stenosis. Subtle cord compression without cord signal abnormality. Bilateral facet arthrosis and uncovertebral hypertrophy.  Moderate to severe right and severe left foraminal stenosis. C6-7: Disc height loss. Disc osteophyte complex abuts the ventral cord. Thickening of the ligamentum flavum abuts the dorsal cord. Moderate spinal canal stenosis. Bilateral uncovertebral hypertrophy and facet arthrosis resulting in moderate to severe bilateral foraminal stenosis. C7-T1: Disc bulge which indents the ventral thecal sac without contacting the spinal cord. Thickening of the ligamentum flavum abuts the dorsal thecal sac more pronounced on the left. Mild-to-moderate spinal canal stenosis. Bilateral facet arthrosis. Mild-to-moderate right and moderate left foraminal stenosis. IMPRESSION: 1. 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 Findings are likely chronic and degenerative in etiology. No findings to suggest acute disc herniation. 2. 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 Moderate spinal canal stenosis at C4-5 through C6-7. 3. Multilevel foraminal stenosis, greatest and severe on the left at C4-5 and C5-6. Additional moderate to severe foraminal stenosis at multiple levels. 4. Prevertebral edema from C3-4 to the upper thoracic spine possibly related to prominent endplate osteophytes. No evidence of anterior longitudinal ligament disruption. 5. Edema of interspinous ligaments at multiple levels suggesting ligament strain. Electronically Signed   By: Emily Filbert M.D.   On: 12/18/2023 15:26   MR ANGIO HEAD WO CONTRAST Result Date: 12/18/2023 CLINICAL DATA:  Stroke follow-up EXAM: MRA HEAD WITHOUT CONTRAST TECHNIQUE: Angiographic images of the Circle of Willis were acquired using MRA technique without intravenous contrast. COMPARISON:  Same day MRI head.  CT head 05/20/2023. FINDINGS: Anterior circulation: The bilateral intracranial ICAs are patent. There is mild narrowing of the bilateral supraclinoid ICAs slightly greater on the  right, likely related to atherosclerosis. The right M1 segment and MCA bifurcation are patent. Distal right MCA branches are patent. The left M1 segment and left MCA bifurcation are patent. Distal left MCA branches are patent. A1 A2 segments are patent bilaterally. Distal ACA branches are patent. Posterior circulation: The visualized intracranial vertebral arteries are patent. Basilar artery is patent. Small bilateral posterior communicating arteries noted. The bilateral PCAs are patent. SCAs and AICAs are visualized bilaterally. Anatomic variants: None Other: None. IMPRESSION: 1. No evidence of intracranial large vessel occlusion or high-grade stenosis. 2. Mild narrowing of the bilateral supraclinoid ICAs slightly greater on the right, likely related to atherosclerosis. Electronically Signed   By: Emily Filbert M.D.   On: 12/18/2023 15:11   DG Chest Port 1 View Result Date: 12/18/2023 CLINICAL DATA:  Weakness. EXAM: PORTABLE CHEST 1 VIEW COMPARISON:  09/29/2023. FINDINGS: Low lung volume. Bilateral lung fields are clear. No acute consolidation or lung collapse. No pulmonary edema. Bilateral costophrenic angles are clear. Stable cardio-mediastinal silhouette. No acute osseous abnormalities. The soft tissues are within normal limits. Right IJ hemodialysis catheter noted with its tip overlying the cavoatrial junction region. IMPRESSION: No active disease. Electronically Signed   By: Jules Schick M.D.   On: 12/18/2023 11:48   MR BRAIN WO CONTRAST  Result Date: 12/18/2023 CLINICAL DATA:  Provided history: Neuro deficit, acute, stroke suspected. EXAM: MRI HEAD WITHOUT CONTRAST TECHNIQUE: Multiplanar, multiecho pulse sequences of the brain and surrounding structures were obtained without intravenous contrast. COMPARISON:  Head CT 05/20/2023. FINDINGS: Brain: Mild-to-moderate generalized cerebral atrophy. Commensurate prominence of the ventricles and sulci. 3 mm acute infarct within the left lentiform nucleus  (series 5, image 26). Chronic lacunar infarcts within the bilateral cerebral hemispheric white matter, within the left thalamus and within the pons. Multifocal T2 FLAIR hyperintense signal abnormality elsewhere within the cerebral white matter (advanced) and pons (moderate), nonspecific but also compatible with chronic small vessel ischemic disease. Several nonspecific chronic microhemorrhages scattered within the supratentorial and infratentorial brain. Small chronic infarct within the left cerebellar hemisphere. No cortical encephalomalacia is identified. No evidence of an intracranial mass. No extra-axial fluid collection. No midline shift. Vascular: Maintained flow voids within the proximal large arterial vessels. Skull and upper cervical spine: No focal worrisome marrow lesion. Sinuses/Orbits: No mass or acute finding within the imaged orbits. Prior bilateral ocular lens replacement. Trace mucosal thickening within the left maxillary sinus. Other: Small-volume fluid within bilateral mastoid air cells. Asymmetric T2 hyperintense signal within the right petrous apex, potentially reflecting trapped fluid. IMPRESSION: 1. 3 mm acute infarct within the left lentiform nucleus. 2. Background parenchymal atrophy, advanced chronic small vessel ischemic disease and chronic infarcts, as detailed. 3. Several nonspecific chronic microhemorrhages scattered within the supratentorial and infratentorial brain. 4. Small-volume fluid within bilateral mastoid air cells. 5. Asymmetric T2 hyperintense signal within the right petrous apex, potentially reflecting trapped fluid. Electronically Signed   By: Jackey Loge D.O.   On: 12/18/2023 10:28    PMH:   Past Medical History:  Diagnosis Date   BPH (benign prostatic hyperplasia)    Chronic kidney disease    ckd stage 3   Diabetes mellitus without complication (HCC)    Hypertension     PSH:   Past Surgical History:  Procedure Laterality Date   EYE SURGERY     INGUINAL  HERNIA REPAIR Bilateral 01/23/2018   Procedure: BILATERAL OPEN INGUINAL HERNIA REPAIR WITH MESH;  Surgeon: Jimmye Norman, MD;  Location: [facility] OR;  Service: General;  Laterality: Bilateral;   INSERTION OF MESH Bilateral 01/23/2018   Procedure: INSERTION OF MESH;  Surgeon: Jimmye Norman, MD;  Location: [facility] OR;  Service: General;  Laterality: Bilateral;   IR FLUORO GUIDE CV LINE RIGHT  08/15/2023   IR PATIENT EVAL TECH 0-60 MINS  11/18/2023   IR PERC CHOLECYSTOSTOMY  10/06/2023   IR US GUIDE BX ASP/DRAIN  10/06/2023   IR US GUIDE VASC ACCESS RIGHT  08/15/2023   VASECTOMY     ~1985    Allergies: No Known Allergies  Medications:   Prior to Admission medications   Medication Sig Start Date End Date Taking? Authorizing Provider  acetaminophen (TYLENOL) 650 MG CR tablet Take 650 mg by mouth every 6 (six) hours as needed for pain.   Yes [provider]  ALPRAZolam (XANAX) 0.25 MG tablet Take 1 tablet (0.25 mg total) by mouth 3 (three) times daily as needed for anxiety. 10/09/23  Yes Tat, Onalee Hua, MD  atorvastatin (LIPITOR) 20 MG tablet Take 1 tablet by mouth daily. 12/06/23 01/05/24 Yes [provider]  Cholecalciferol 125 MCG (5000 UT) capsule Take by mouth.   Yes [provider]  cycloSPORINE (RESTASIS) 0.05 % ophthalmic emulsion SMARTSIG:In Eye(s) 08/13/23  Yes [provider]  Dorzolamide HCl-Timolol Mal PF 2-0.5 % SOLN Apply 1  drop to eye 2 (two) times daily. 08/18/23  Yes [provider]  Fluoxetine HCl, PMDD, 20 MG TABS Take 1 tablet by mouth daily. 12/07/23 01/06/24 Yes [provider]  folic acid (FOLVITE) 800 MCG tablet Take 800 mcg by mouth daily.   Yes [provider]  LANTUS 100 UNIT/ML injection Inject 26 Units into the skin daily.   Yes [provider]  latanoprost (XALATAN) 0.005 % ophthalmic solution Place 1 drop into both eyes at bedtime. Patient taking differently: Place 1 drop into the left eye at bedtime. 08/25/23  Yes  Tat, Onalee Hua, MD  multivitamin (RENA-VIT) TABS tablet Take 1 tablet by mouth at bedtime. 08/20/23  Yes Tat, Onalee Hua, MD  nystatin cream (MYCOSTATIN) Apply 1 application topically 2 (two) times daily as needed. FOR YEAST OR SKIN RASHES. 12/16/17  Yes [provider]  ondansetron (ZOFRAN) 4 MG tablet Take by mouth 2 (two) times daily. 12/06/23  Yes [provider]  tamsulosin (FLOMAX) 0.4 MG CAPS capsule Take 0.4 mg by mouth daily with supper. 12/16/17  Yes [provider]    Discontinued Meds:   Medications Discontinued During This Encounter  Medication Reason   FLUoxetine (PROZAC) 20 MG capsule Patient Preference   insulin glargine (LANTUS) injection 26 Units    0.9 %  sodium chloride infusion    Dorzolamide HCl-Timolol Mal PF 2-0.5 % SOLN 1 drop     Social History:  reports that he has never smoked. He has never used smokeless tobacco. He reports that he does not drink alcohol and does not use drugs.  Family History:  History reviewed. No pertinent family history.  Blood pressure 132/66, pulse 62, temperature 98.5 F (36.9 C), temperature source Oral, resp. rate 15, height 6\' 1"  (1.854 m), weight 86.2 kg, SpO2 99%. General adult male in bed in no acute distress   HEENT NCAT Neck supple trachea midline Lungs CTA b/l Heart S1S2 no rub Abdomen soft nontender nondistended Extremities no edema  Psych normal mood and affect Access RIJ tunn catheter, no active discharge, crusting noted Neuro right upper arm 4 out of 5 strength GU: Male pure wick       Janeva Peaster, Len Blalock, MD 12/19/2023, 9:36 AM

## 2023-12-19 NOTE — Evaluation (Signed)
 Physical Therapy Evaluation Patient Details Name: Philip Benjamin MRN: [id number] DOB: 03/11/1951 Today's Date: 12/19/2023  History of Present Illness  Philip Benjamin is a 73-year-old male with extensive history of DM2, HTN, HLD,ESRD on HD presenting with chief complaint of weakness.  Patient reports right-sided weakness in the past 48 hours, right upper and lower extremity.  No change in speech or visual deficits.    States that his weakness, symptoms are gradually worsened but not significantly, no improvement.  He has completed dialysis yesterday   Clinical Impression  Patient was agreeable to therapy. Was CTG for bed mobility but was mod/max for transfers and ambulation. Patient was mod assist for initial boost to stand. Max for ambulation with heavy reliance on RW. Patient limited during ambulation due to fatigue and R knee buckling. Rest of ambulation was discontinued for patient safety. Patient was left in chair at conclusion of session. Patient will benefit from continued skilled physical therapy in hospital and recommended venue below to increase strength, balance, endurance for safe ADLs and gait.          If plan is discharge home, recommend the following: A lot of help with bathing/dressing/bathroom;A lot of help with walking and/or transfers;Assistance with cooking/housework;Help with stairs or ramp for entrance   Can travel by private vehicle        Equipment Recommendations None recommended by PT  Recommendations for Other Services       Functional Status Assessment Patient has had a recent decline in their functional status and demonstrates the ability to make significant improvements in function in a reasonable and predictable amount of time.     Precautions / Restrictions Precautions Precautions: Fall Recall of Precautions/Restrictions: Intact Restrictions Weight Bearing Restrictions Per Provider Order: No      Mobility  Bed Mobility Overal bed mobility:  Needs Assistance Bed Mobility: Supine to Sit     Supine to sit: Contact guard     General bed mobility comments: slow labored movement Patient Response: Cooperative  Transfers Overall transfer level: Needs assistance Equipment used: Rolling walker (2 wheels) Transfers: Sit to/from Stand, Bed to chair/wheelchair/BSC Sit to Stand: Mod assist   Step pivot transfers: Mod assist, Max assist       General transfer comment: labored movement; poor weight bearing through R LE    Ambulation/Gait Ambulation/Gait assistance: Mod assist, Max assist Gait Distance (Feet): 10 Feet Assistive device: Rolling walker (2 wheels) Gait Pattern/deviations: Knees buckling, Step-to pattern, Decreased step length - right, Decreased step length - left, Decreased stride length, Narrow base of support Gait velocity: slow     General Gait Details: Slow, labored movement with increased difficutly to move RLE, R knee started to buckle leading to ambulation being discontinued for patient safety  Stairs            Wheelchair Mobility     Tilt Bed Tilt Bed Patient Response: Cooperative  Modified Rankin (Stroke Patients Only)       Balance Overall balance assessment: Needs assistance Sitting-balance support: Feet supported, No upper extremity supported Sitting balance-Leahy Scale: Poor Sitting balance - Comments: poor to fair Postural control: Posterior lean Standing balance support: Bilateral upper extremity supported, During functional activity, Reliant on assistive device for balance Standing balance-Leahy Scale: Poor Standing balance comment: using RW                             Pertinent Vitals/Pain Pain Assessment Pain Assessment: Faces Faces  Pain Scale: Hurts little more Pain Location: R flank pain; R shoulder Pain Descriptors / Indicators: Grimacing, Guarding Pain Intervention(s): Limited activity within patient's tolerance, Monitored during session, Repositioned     Home Living Family/patient expects to be discharged to:: Private residence Living Arrangements: Spouse/significant other;Children Available Help at Discharge: Family;Available PRN/intermittently Type of Home: House Home Access: Stairs to enter Entrance Stairs-Rails: None Entrance Stairs-Number of Steps: 5 Alternate Level Stairs-Number of Steps: 5 Home Layout: Two level;Laundry or work area in basement;Able to live on main level with bedroom/bathroom;Full bath on main level Home Equipment: Rolling Walker (2 wheels);Shower seat;Hand held shower head;BSC/3in1 Additional Comments: per PT note    Prior Function Prior Level of Function : Needs assist       Physical Assist : ADLs (physical)   ADLs (physical): Dressing;Bathing;Toileting;IADLs Mobility Comments: houshold ambulatory with RW, but decreased ability in the last 2 weeks. ADLs Comments: Assist for bathing, dressing, toileting. Able to groom and feed.     Extremity/Trunk Assessment   Upper Extremity Assessment Upper Extremity Assessment: Defer to OT evaluation RUE Deficits / Details: 2+/5 bilateral shoulder flexion and abduction; 5/5 elbow flexion and extension; 4/5 wrist flexion and extension, 4-/5 grip. RUE Sensation: decreased light touch;decreased proprioception RUE Coordination: decreased fine motor;decreased gross motor LUE Deficits / Details: 2+/5 shoulder flexion and abduction; generally weak othrwise but noted poor propriocepition for finger to nose test. LUE Sensation: decreased proprioception LUE Coordination: decreased gross motor    Lower Extremity Assessment Lower Extremity Assessment: Generalized weakness    Cervical / Trunk Assessment Cervical / Trunk Assessment: Kyphotic  Communication   Communication Communication: No apparent difficulties    Cognition Arousal: Alert Behavior During Therapy: WFL for tasks assessed/performed   PT - Cognitive impairments: No apparent impairments                          Following commands: Intact       Cueing Cueing Techniques: Verbal cues     General Comments      Exercises     Assessment/Plan    PT Assessment Patient needs continued PT services  PT Problem List Decreased strength;Decreased range of motion;Decreased activity tolerance;Decreased balance;Decreased mobility       PT Treatment Interventions DME instruction;Gait training;Patient/family education;Stair training;Functional mobility training;Therapeutic activities;Therapeutic exercise;Balance training    PT Goals (Current goals can be found in the Care Plan section)  Acute Rehab PT Goals Patient Stated Goal: to return home after rehab PT Goal Formulation: With patient Time For Goal Achievement: 01/02/24 Potential to Achieve Goals: Good    Frequency Min 3X/week     Co-evaluation PT/OT/SLP Co-Evaluation/Treatment: Yes Reason for Co-Treatment: To address functional/ADL transfers PT goals addressed during session: Mobility/safety with mobility OT goals addressed during session: ADL's and self-care       AM-PAC PT "6 Clicks" Mobility  Outcome Measure Help needed turning from your back to your side while in a flat bed without using bedrails?: A Little Help needed moving from lying on your back to sitting on the side of a flat bed without using bedrails?: A Little Help needed moving to and from a bed to a chair (including a wheelchair)?: A Lot Help needed standing up from a chair using your arms (e.g., wheelchair or bedside chair)?: A Lot Help needed to walk in hospital room?: A Lot Help needed climbing 3-5 steps with a railing? : Total 6 Click Score: 13    End of Session Equipment Utilized During  Treatment: Gait belt Activity Tolerance: Patient tolerated treatment well;Patient limited by fatigue Patient left: in chair;with call bell/phone within reach Nurse Communication: Mobility status PT Visit Diagnosis: Unsteadiness on feet (R26.81);Other  abnormalities of gait and mobility (R26.89);Muscle weakness (generalized) (M62.81)    Time: 4098-1191 PT Time Calculation (min) (ACUTE ONLY): 30 min   Charges:   PT Evaluation $PT Eval Moderate Complexity: 1 Mod PT Treatments $Therapeutic Activity: 23-37 mins PT General Charges $$ ACUTE PT VISIT: 1 Visit         Iden Stripling SPT

## 2023-12-19 NOTE — Care Management Important Message (Signed)
 Important Message  Patient Details  Name: Philip Benjamin MRN: [id number] Date of Birth: 11-Aug-1951   Important Message Given:  Yes - Medicare IM (reviewed letter with daught [name] at [phone])     Corey Harold 12/19/2023, 11:34 AM

## 2023-12-19 NOTE — Plan of Care (Signed)
  Problem: Acute Rehab OT Goals (only OT should resolve) Goal: Pt. Will Perform Grooming Flowsheets (Taken 12/19/2023 1121) Pt Will Perform Grooming:  with set-up  sitting Goal: Pt. Will Perform Upper Body Dressing Flowsheets (Taken 12/19/2023 1121) Pt Will Perform Upper Body Dressing:  with set-up  sitting Goal: Pt. Will Perform Lower Body Dressing Flowsheets (Taken 12/19/2023 1121) Pt Will Perform Lower Body Dressing:  with set-up  sitting/lateral leans Goal: Pt. Will Transfer To Toilet Flowsheets (Taken 12/19/2023 1121) Pt Will Transfer to Toilet:  with supervision  stand pivot transfer  ambulating Goal: Pt. Will Perform Toileting-Clothing Manipulation Flowsheets (Taken 12/19/2023 1121) Pt Will Perform Toileting - Clothing Manipulation and hygiene:  with contact guard assist  sitting/lateral leans  with min assist Goal: Pt/Caregiver Will Perform Home Exercise Program Flowsheets (Taken 12/19/2023 1121) Pt/caregiver will Perform Home Exercise Program:  Increased ROM  Increased strength  Both right and left upper extremity  Independently  Adelis Docter OT, MOT

## 2023-12-19 NOTE — Plan of Care (Signed)
  Problem: Education: Goal: Knowledge of disease or condition will improve 12/19/2023 0230 by Beatris Ship, LPN Outcome: Progressing 12/19/2023 0228 by Beatris Ship, LPN Outcome: Progressing Goal: Knowledge of secondary prevention will improve (MUST DOCUMENT ALL) Outcome: Progressing Goal: Knowledge of patient specific risk factors will improve (DELETE if not current risk factor) Outcome: Progressing   Problem: Ischemic Stroke/TIA Tissue Perfusion: Goal: Complications of ischemic stroke/TIA will be minimized 12/19/2023 0230 by Beatris Ship, LPN Outcome: Progressing 12/19/2023 0228 by Beatris Ship, LPN Outcome: Progressing   Problem: Coping: Goal: Will verbalize positive feelings about self Outcome: Progressing   Problem: Health Behavior/Discharge Planning: Goal: Ability to manage health-related needs will improve Outcome: Progressing   Problem: Self-Care: Goal: Ability to participate in self-care as condition permits will improve Outcome: Progressing   Problem: Nutrition: Goal: Risk of aspiration will decrease Outcome: Progressing   Problem: Education: Goal: Ability to describe self-care measures that may prevent or decrease complications (Diabetes Survival Skills Education) will improve Outcome: Progressing   Problem: Coping: Goal: Ability to adjust to condition or change in health will improve Outcome: Progressing   Problem: Health Behavior/Discharge Planning: Goal: Ability to identify and utilize available resources and services will improve Outcome: Progressing   Problem: Metabolic: Goal: Ability to maintain appropriate glucose levels will improve Outcome: Progressing   Problem: Nutritional: Goal: Maintenance of adequate nutrition will improve Outcome: Progressing   Problem: Skin Integrity: Goal: Risk for impaired skin integrity will decrease Outcome: Progressing   Problem: Tissue Perfusion: Goal: Adequacy of tissue perfusion will  improve Outcome: Progressing

## 2023-12-19 NOTE — Progress Notes (Signed)
  Inpatient Rehab Admissions Coordinator :  Per OT therapy recommendations, patient was screened for CIR candidacy by Ottie Glazier RN MSN.  Noted recent SNF placement and return home.At this time patient appears to be a potential candidate for CIR. I will place a rehab consult per protocol for full assessment. Please call me with any questions.  Ottie Glazier RN MSN Admissions Coordinator [phone]

## 2023-12-19 NOTE — Plan of Care (Signed)
  Problem: Education: Goal: Knowledge of disease or condition will improve Outcome: Progressing   Problem: Ischemic Stroke/TIA Tissue Perfusion: Goal: Complications of ischemic stroke/TIA will be minimized Outcome: Progressing   Problem: Coping: Goal: Will verbalize positive feelings about self Outcome: Progressing   Problem: Health Behavior/Discharge Planning: Goal: Ability to manage health-related needs will improve Outcome: Progressing   Problem: Self-Care: Goal: Ability to participate in self-care as condition permits will improve Outcome: Progressing   Problem: Nutrition: Goal: Risk of aspiration will decrease Outcome: Progressing   Problem: Education: Goal: Ability to describe self-care measures that may prevent or decrease complications (Diabetes Survival Skills Education) will improve Outcome: Progressing   Problem: Coping: Goal: Ability to adjust to condition or change in health will improve Outcome: Progressing   Problem: Health Behavior/Discharge Planning: Goal: Ability to identify and utilize available resources and services will improve Outcome: Progressing   Problem: Metabolic: Goal: Ability to maintain appropriate glucose levels will improve Outcome: Progressing   Problem: Nutritional: Goal: Maintenance of adequate nutrition will improve Outcome: Progressing   Problem: Skin Integrity: Goal: Risk for impaired skin integrity will decrease Outcome: Progressing   Problem: Tissue Perfusion: Goal: Adequacy of tissue perfusion will improve Outcome: Progressing

## 2023-12-19 NOTE — Progress Notes (Signed)
 Patient ID: Philip Benjamin, male   DOB: [birth year], 73 y.o.   MRN: [id number] Called to evaluate cervical spine MRI on patient who presented with an acute stroke.  Patient is located at [facility].   Neurology is evaluating the patient has diagnosed an acute stroke and is been workup.  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  C3-4 certainly the worst level causing some cord compression this looks chronic but certainly if causing enough cord compression at some point patient will require surgery.  However it is important to stabilize the patient from his acute cerebrovascular incident and when Neurology clears the patient we would be happy to evaluate for cervical decompressive surgery.

## 2023-12-19 NOTE — TOC Initial Note (Signed)
 Transition of Care [facility]) - Initial/Assessment Note    Patient Details  Name: Philip Benjamin MRN: [id number] Date of Birth: 1951/02/05  Transition of Care [facility]) CM/SW Contact:    Leitha Bleak, RN Phone Number: 12/19/2023, 1:52 PM  Clinical Narrative:     Patient admitted with acute stroke due to ischemia. Consider a high risk for readmission. Patient goes to dialysis is on MWF at [facility]. Family support. Active with [facility] for HHPT/OT/RN.  PT is recommending CIR. CIR is reviewing for admission. TOC following.    Expected Discharge Plan: IP Rehab Facility Barriers to Discharge: Continued Medical Work up   Patient Goals and CMS Choice Patient states their goals for this hospitalization and ongoing recovery are:: to get better CMS Medicare.gov Compare Post Acute Care list provided to:: Patient Represenative (must comment) Choice offered to / list presented to : Spouse [place] ownership interest in [facility]provided to:: Patient    Expected Discharge Plan and Services       Living arrangements for the past 2 months: Single Family Home                    Prior Living Arrangements/Services Living arrangements for the past 2 months: Single Family Home Lives with:: Spouse Patient language and need for interpreter reviewed:: Yes        Need for Family Participation in Patient Care: Yes (Comment) Care giver support system in place?: Yes (comment) Current home services: Home OT, Home PT, Home RN Criminal Activity/Legal Involvement Pertinent to Current Situation/Hospitalization: No - Comment as needed  Activities of Daily Living   ADL Screening (condition at time of admission) Independently performs ADLs?: No Does the patient have a NEW difficulty with bathing/dressing/toileting/self-feeding that is expected to last >3 days?: Yes (Initiates electronic notice to provider for possible OT consult) Does the patient have a NEW difficulty with getting in/out of  bed, walking, or climbing stairs that is expected to last >3 days?: Yes (Initiates electronic notice to provider for possible PT consult) Does the patient have a NEW difficulty with communication that is expected to last >3 days?: No Is the patient deaf or have difficulty hearing?: No Does the patient have difficulty seeing, even when wearing glasses/contacts?: No Does the patient have difficulty concentrating, remembering, or making decisions?: No  Permission Sought/Granted                  Emotional Assessment     Affect (typically observed): Accepting Orientation: : Oriented to Self, Oriented to Place, Oriented to  Time, Oriented to Situation Alcohol / Substance Use: Not Applicable Psych Involvement: No (comment)  Admission diagnosis:  Acute CVA (cerebrovascular accident) (HCC) [I63.9] Acute stroke due to ischemia [facility]) [I63.9] Patient Active Problem List   Diagnosis Date Noted   Acute stroke due to ischemia (HCC) 12/18/2023   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   Generalized weakness 10/02/2023   COVID-19 09/29/2023   Pseudomonas sepsis (HCC) 08/23/2023   HCAP (healthcare-associated pneumonia) 08/22/2023   Thrombocytopenia (HCC) 08/22/2023   Transaminitis 08/22/2023   Folate deficiency 08/22/2023   Glaucoma 08/22/2023   Sepsis due to undetermined organism (HCC) 08/22/2023   Urinary tract infection with hematuria 08/22/2023   Sepsis due to urinary tract infection (HCC) 08/21/2023   Malnutrition of moderate degree 08/18/2023   ESRD (end stage renal disease) on dialysis (HCC) 08/14/2023   Essential hypertension 08/14/2023   T2DM (type 2 diabetes mellitus) (HCC) 08/14/2023   Mixed hyperlipidemia 08/14/2023  BPH (benign prostatic hyperplasia) 08/14/2023   PCP:  Kirstie Peri, MD Pharmacy:   [facility] [phone] - [address] [place] - [address] AT [facility] [address] [place] [address] Phone: [phone] Fax:  [phone]     Social Drivers of Health (SDOH) Social History: SDOH Screenings   Food Insecurity: No Food Insecurity (12/18/2023)  Housing: Low Risk  (12/18/2023)  Transportation Needs: No Transportation Needs (12/18/2023)  Utilities: Not At Risk (12/18/2023)  Social Connections: Moderately Integrated (12/18/2023)  Tobacco Use: Low Risk  (12/18/2023)  Health Literacy: Medium Risk (10/17/2023)   Received from [facility]   SDOH Interventions:     Readmission Risk Interventions    12/19/2023    1:50 PM 10/01/2023    7:48 AM 08/22/2023   12:48 PM  Readmission Risk Prevention Plan  Transportation Screening Complete Complete Complete  PCP or Specialist Appt within 3-5 Days Not Complete    Home Care Screening   Complete  Medication Review (RN CM)   Complete  HRI or Home Care Consult Complete Complete   Social Work Consult for Recovery Care Planning/Counseling Complete Complete   Palliative Care Screening Not Applicable Not Applicable   Medication Review [employer]) Complete Complete

## 2023-12-20 DIAGNOSIS — I639 Cerebral infarction, unspecified: Secondary | ICD-10-CM | POA: Diagnosis not present

## 2023-12-20 LAB — RENAL FUNCTION PANEL
Albumin: 3.1 g/dL — ABNORMAL LOW (ref 3.5–5.0)
Anion gap: 11 (ref 5–15)
BUN: 21 mg/dL (ref 8–23)
CO2: 25 mmol/L (ref 22–32)
Calcium: 8.7 mg/dL — ABNORMAL LOW (ref 8.9–10.3)
Chloride: 97 mmol/L — ABNORMAL LOW (ref 98–111)
Creatinine, Ser: 2.44 mg/dL — ABNORMAL HIGH (ref 0.61–1.24)
GFR, Estimated: 27 mL/min — ABNORMAL LOW (ref >60)
Glucose, Bld: 284 mg/dL — ABNORMAL HIGH (ref 70–99)
Phosphorus: 2.7 mg/dL (ref 2.5–4.6)
Potassium: 3.7 mmol/L (ref 3.5–5.1)
Sodium: 133 mmol/L — ABNORMAL LOW (ref 135–145)

## 2023-12-20 LAB — GLUCOSE, CAPILLARY
Glucose-Capillary: 286 mg/dL — ABNORMAL HIGH (ref 70–99)
Glucose-Capillary: 279 mg/dL — ABNORMAL HIGH (ref 70–99)
Glucose-Capillary: 341 mg/dL — ABNORMAL HIGH (ref 70–99)
Glucose-Capillary: 310 mg/dL — ABNORMAL HIGH (ref 70–99)

## 2023-12-20 LAB — CBC
HCT: 28.5 % — ABNORMAL LOW (ref 39.0–52.0)
Hemoglobin: 9 g/dL — ABNORMAL LOW (ref 13.0–17.0)
MCH: 30 pg (ref 26.0–34.0)
MCHC: 31.6 g/dL (ref 30.0–36.0)
MCV: 95 fL (ref 80.0–100.0)
Platelets: 131 10*3/uL — ABNORMAL LOW (ref 150–400)
RBC: 3 MIL/uL — ABNORMAL LOW (ref 4.22–5.81)
RDW: 11.7 % (ref 11.5–15.5)
WBC: 7 10*3/uL (ref 4.0–10.5)
nRBC: 0 % (ref 0.0–0.2)

## 2023-12-20 LAB — HEPATITIS B SURFACE ANTIBODY, QUANTITATIVE: Hep B S AB Quant (Post): 84.5 m[IU]/mL

## 2023-12-20 MED ORDER — NEPRO/CARBSTEADY PO LIQD
237.0000 mL | Freq: Two times a day (BID) | ORAL | Status: DC
Start: 2023-12-21 — End: 2023-12-23
  Administered 2023-12-21 – 2023-12-23 (×4): 237 mL via ORAL

## 2023-12-20 MED ORDER — INSULIN GLARGINE-YFGN 100 UNIT/ML ~~LOC~~ SOLN
30.0000 [IU] | Freq: Every day | SUBCUTANEOUS | Status: DC
  Administered 2023-12-20 – 2023-12-21 (×2): 30 [IU] via SUBCUTANEOUS
  Filled 2023-12-20 (×3): qty 0.3

## 2023-12-20 MED ORDER — ATORVASTATIN CALCIUM 40 MG PO TABS
40.0000 mg | ORAL_TABLET | Freq: Every day | ORAL | Status: DC
  Administered 2023-12-20 – 2023-12-22 (×3): 40 mg via ORAL
  Filled 2023-12-20 (×3): qty 1

## 2023-12-20 NOTE — Progress Notes (Signed)
 Tele called 2 times saying that pt.'s HR was brady at 45. Nurse went to assess pt., Pt. Lying in bed with eyes open watching television. Nurse asked pt. If he felt different, pt. Denied any complaints, stating "I feel great. I don't feel anything. I feel like my normal self." Notified MD Mansey. EKG obtained per Dr. Joylene Igo. Vital signs stable. MD said to administer atropine 0.5mg  IV if HR falls below 40 bpm. Charge nurse, and AC notified. Nurse educated pt. To let nurse or staff know if he starts to feeling symptomatic.

## 2023-12-20 NOTE — Plan of Care (Signed)
   Problem: Nutrition: Goal: Risk of aspiration will decrease Outcome: Progressing

## 2023-12-20 NOTE — Progress Notes (Signed)
 Inpatient Rehab Admissions Coordinator:    I met with pt. To discuss potential CIR admit. He was interested, Daughter states that she and her brother can be home with Pt. And assist 24/7. I will follow for potential admit pending bed availability.   Megan Salon, MS, CCC-SLP Rehab Admissions Coordinator  [phone] (celll) [phone] (office)

## 2023-12-20 NOTE — Progress Notes (Signed)
 PROGRESS NOTE    Patient: Philip Benjamin                            PCP: Kirstie Peri, MD                    DOB: Jun 18, 1951            DOA: 12/18/2023 ZOX:[id number]             DOS: 12/20/2023, 11:54 AM   LOS: 2 days   Date of Service: The patient was seen and examined on 12/20/2023  Subjective:   The patient was seen and examined this morning, stable no acute distress reporting no changes in right upper and lower extremity weakness.  Pending CIR approval  Brief Narrative:   Philip Benjamin is a 73-year-old male with extensive history of DM2, HTN, HLD,ESRD on HD presenting with chief complaint of weakness.  Patient reports right-sided weakness in the past 48 hours, right upper and lower extremity.  No change in speech or visual deficits.   States that his weakness, symptoms are gradually worsened but not significantly, no improvement. He has completed dialysis yesterday   ED: Blood pressure 115/76, pulse 63, temperature 98.2 F (36.8 C), RR 15, height 6\' 1"  (1.854 m), weight 86.2 kg, SpO2 100%.   Labs-reviewed CBC CMP, creatinine 3.02, troponin 52, 52, hemoglobin 5.6, hematocrit 10.0, Respiratory panel, influenza A/B, SARS-CoV-2, RSV all negative Chest x-ray within normal limits  MRI of the brain: IMPRESSION: 1. 3 mm acute infarct within the left lentiform nucleus. 2. Background parenchymal atrophy, advanced chronic small vessel ischemic disease and chronic infarcts, as detailed. 3. Several nonspecific chronic microhemorrhages scattered within the supratentorial and infratentorial brain. 4. Small-volume fluid within bilateral mastoid air cells. 5. Asymmetric T2 hyperintense signal within the right petrous apex, potentially reflecting trapped fluid.  EDP requested patient to be admitted for acute stroke workup    Assessment & Plan:   Principal Problem:   Acute stroke due to ischemia [facility]) Active Problems:   T2DM (type 2 diabetes mellitus) (HCC)   BPH (benign  prostatic hyperplasia)   ESRD (end stage renal disease) on dialysis [facility])   Essential hypertension   Mixed hyperlipidemia   Glaucoma   Generalized weakness   Anemia in chronic kidney disease     Assessment and Plan: * Acute stroke due to ischemia (HCC) -Continue to monitor on telemetry-no signs of dysrhythmia or arrhythmia -No significant changes on neurochecks  -MRI of the brain reviewed: Acute on chronic infarct on the left lentiform nucleus, chronic microhemorrhages scattered within the supratentorial and infratentorial area, -Bilateral carotid studies - negative for any significant stenosis -MRA:No evidence of intracranial large vessel occlusion or high-grade stenosis.  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 Melynda Ripple  and neurosurgery on-call Dr. Wynetta Emery -who recommends surgery in the future-close follow-up. Dr. Wynetta Emery recommends -trial of oral steroids, and follow-up as an outpatient No intervention recommended at this point   - Echo: Reviewed; mild LVH, ejection fraction 60 to 65%  -Neurology: Recommended continue high-dose statin, aspirin -Continue high-dose statins, aspirin,  -We will holding BP meds, allowing for permissive hypertension -Consulted  PT/OT-recommending CIR, pending approval - S/p Speecheval: -Passed swallow evaluation , -SAR labs within normal limits LDL 28, folate 32.4, B12 690, TSH 2.5,  ESRD (end stage renal disease) on dialysis [facility]) -Appreciate nephrology managing inpatient HD -regular outpatient treatments  on MWF -Hemodialysis 2/26, 2/28   T2DM (type 2 diabetes mellitus) (HCC) - Checking hemoglobin A1c -Will check CBG q. ACH S with Philip Benjamin coverage Continue long-acting insulin -Last A1c 5.6  BPH (benign prostatic hyperplasia) - Monitoring urine output,  -Continue Flomax  Anemia in chronic kidney disease Monitoring H&H, remained stable  Generalized weakness -  PT OT -recommending  inpatient rehab   Glaucoma - With history of increased intraocular pressure,  - Continue the use of latanoprost and also dorzolamide/timolol. -Continue outpatient follow-up with ophthalmology service.  Mixed hyperlipidemia - Initiating high-dose statins -Follow-up with lipid panel, with LDL goal of less than 70 LDL 28  Essential hypertension - Currently on metoprolol-will be continued    -------------------------------------------------------------------------------------------------------------------------- Nutritional status:  The patient's BMI is: Body mass index is 25.01 kg/m. I agree with the assessment and plan as outlined  ------------------------------------------------------------------------------------------------------------------------------------------------  DVT prophylaxis:  heparin injection 5,000 Units Start: 12/18/23 1400 SCD's Start: 12/18/23 1218   Code Status:   Code Status: Full Code  Family Communication: Discussed with daughter on the phone  -Advance care planning has been discussed.   Admission status:   Status is: Inpatient Remains inpatient appropriate because: Continue stroke workup   Disposition: From  - home             Planning for discharge on Monday, 12/22/2023 to CIR  Procedures:   No admission procedures for hospital encounter.   Antimicrobials:  Anti-infectives (From admission, onward)    None        Medication:    stroke: early stages of recovery book   Does not apply Once   aspirin  81 mg Oral Daily   atorvastatin  80 mg Oral Daily   Chlorhexidine Gluconate Cloth  6 each Topical Q0600   cycloSPORINE  1 drop Both Eyes BID   dexamethasone  4 mg Oral Q8H   dorzolamide-timolol  1 drop Both Eyes BID   FLUoxetine  20 mg Oral Daily   heparin  5,000 Units Subcutaneous Q8H   insulin aspart  0-6 Units Subcutaneous TID WC   insulin glargine-yfgn  30 Units Subcutaneous Daily   latanoprost  1 drop Left Eye QHS    multivitamin  1 tablet Oral QHS   tamsulosin  0.4 mg Oral Q supper    acetaminophen **OR** acetaminophen (TYLENOL) oral liquid 160 mg/5 mL **OR** acetaminophen, ALPRAZolam, alteplase, anticoagulant sodium citrate, heparin, senna-docusate   Objective:   Vitals:   12/19/23 2026 12/19/23 2049 12/20/23 0300 12/20/23 1004  BP: 137/78 134/70 (!) 144/59 (!) 148/76  Pulse: 74 74 60 72  Resp: 20 19    Temp: 98.5 F (36.9 C) 98.3 F (36.8 C) 98.3 F (36.8 C) 98.8 F (37.1 C)  TempSrc: Oral  Oral Oral  SpO2: 96% 100% 98% 100%  Weight:      Height:        Intake/Output Summary (Last 24 hours) at 12/20/2023 1154 Last data filed at 12/20/2023 0500 Gross per 24 hour  Intake 240 ml  Output 600 ml  Net -360 ml   Filed Weights   12/18/23 0850 12/19/23 1645 12/19/23 1955  Weight: 86.2 kg 86.8 kg 86 kg     Physical examination:    General:  AAO x 3,  cooperative, no distress;   HEENT:  Normocephalic, PERRL, otherwise with in Normal limits   Neuro:  Right upper/lower extremity weakness CNII-XII intact. , normal motor and sensation, reflexes intact   Lungs:   Clear to auscultation BL, Respirations unlabored,  No wheezes / crackles  Cardio:    S1/S2, RRR, No murmure, No Rubs or Gallops   Abdomen:  Soft, non-tender, bowel sounds active all four quadrants, no guarding or peritoneal signs.  Muscular  skeletal:  Limited exam -global generalized weaknesses Right upper lower extremity weakness, with gait abnormality - in bed, able to move all 4 extremities,   2+ pulses,  symmetric, No pitting edema  Skin:  Dry, warm to touch, negative for any Rashes,  Wounds: Please see nursing documentation     -----------------------------------------------------------------------------------------------------------------------------    LABs:     Latest Ref Rng & Units 12/20/2023    9:30 AM 12/18/2023    9:22 AM 10/10/2023    4:14 AM  CBC  WBC 4.0 - 10.5 K/uL 7.0  5.6  7.7   Hemoglobin 13.0 - 17.0  g/dL 9.0  16.1  8.7   Hematocrit 39.0 - 52.0 % 28.5  31.8  28.5   Platelets 150 - 400 K/uL 131  129  213       Latest Ref Rng & Units 12/20/2023    9:30 AM 12/18/2023    9:22 AM 10/10/2023    4:14 AM  CMP  Glucose 70 - 99 mg/dL 096  99  97   BUN 8 - 23 mg/dL 21  20  23    Creatinine 0.61 - 1.24 mg/dL 0.45  4.09  8.11   Sodium 135 - 145 mmol/L 133  137  135   Potassium 3.5 - 5.1 mmol/L 3.7  3.5  3.8   Chloride 98 - 111 mmol/L 97  98  100   CO2 22 - 32 mmol/L 25  27  25    Calcium 8.9 - 10.3 mg/dL 8.7  9.3  8.6   Total Protein 6.5 - 8.1 g/dL   5.4   Total Bilirubin <1.2 mg/dL   0.4   Alkaline Phos 38 - 126 U/L   71   AST 15 - 41 U/L   35   ALT 0 - 44 U/L   59        Micro Results Recent Results (from the past 240 hours)  Resp panel by RT-PCR (RSV, Flu A&B, Covid) Anterior Nasal Swab     Status: None   Collection Time: 12/18/23  9:05 AM   Specimen: Anterior Nasal Swab  Result Value Ref Range Status   SARS Coronavirus 2 by RT PCR NEGATIVE NEGATIVE Final    Comment: (NOTE) SARS-CoV-2 target nucleic acids are NOT DETECTED.  The SARS-CoV-2 RNA is generally detectable in upper respiratory specimens during the acute phase of infection. The lowest concentration of SARS-CoV-2 viral copies this assay can detect is 138 copies/mL. A negative result does not preclude SARS-Cov-2 infection and should not be used as the sole basis for treatment or other patient management decisions. A negative result may occur with  improper specimen collection/handling, submission of specimen other than nasopharyngeal swab, presence of viral mutation(s) within the areas targeted by this assay, and inadequate number of viral copies(<138 copies/mL). A negative result must be combined with clinical observations, patient history, and epidemiological information. The expected result is Negative.  Fact Sheet for Patients:  BloggerCourse.com  Fact Sheet for Healthcare Providers:   SeriousBroker.it  This test is no t yet approved or cleared by the [place] FDA and  has been authorized for detection and/or diagnosis of SARS-CoV-2 by FDA under an Emergency Use Authorization (EUA). This EUA will remain  in effect (meaning this test can be used) for  the duration of the COVID-19 declaration under Section 564(b)(1) of the Act, 21 U.S.C.section 360bbb-3(b)(1), unless the authorization is terminated  or revoked sooner.       Influenza A by PCR NEGATIVE NEGATIVE Final   Influenza B by PCR NEGATIVE NEGATIVE Final    Comment: (NOTE) The Xpert Xpress SARS-CoV-2/FLU/RSV plus assay is intended as an aid in the diagnosis of influenza from Nasopharyngeal swab specimens and should not be used as a sole basis for treatment. Nasal washings and aspirates are unacceptable for Xpert Xpress SARS-CoV-2/FLU/RSV testing.  Fact Sheet for Patients: BloggerCourse.com  Fact Sheet for Healthcare Providers: SeriousBroker.it  This test is not yet approved or cleared by the [place] FDA and has been authorized for detection and/or diagnosis of SARS-CoV-2 by FDA under an Emergency Use Authorization (EUA). This EUA will remain in effect (meaning this test can be used) for the duration of the COVID-19 declaration under Section 564(b)(1) of the Act, 21 U.S.C. section 360bbb-3(b)(1), unless the authorization is terminated or revoked.     Resp Syncytial Virus by PCR NEGATIVE NEGATIVE Final    Comment: (NOTE) Fact Sheet for Patients: BloggerCourse.com  Fact Sheet for Healthcare Providers: SeriousBroker.it  This test is not yet approved or cleared by the [place] FDA and has been authorized for detection and/or diagnosis of SARS-CoV-2 by FDA under an Emergency Use Authorization (EUA). This EUA will remain in effect (meaning this test can be used) for  the duration of the COVID-19 declaration under Section 564(b)(1) of the Act, 21 U.S.C. section 360bbb-3(b)(1), unless the authorization is terminated or revoked.  Performed at [facility], [address] [place], [place] [address]     Radiology Reports ECHOCARDIOGRAM COMPLETE Result Date: 12/19/2023    ECHOCARDIOGRAM REPORT   Patient Name:   Philip Benjamin Date of Exam: 12/19/2023 Medical Rec #:  [id number]        Height:       73.0 in Accession #:    [id number]       Weight:       190.0 lb Date of Birth:  1951-01-03       BSA:          2.105 m Patient Age:    72 years         BP:           132/66 mmHg Patient Gender: M                HR:           62 bpm. [name] Procedure: 2D Echo, Cardiac Doppler, Color Doppler and Intracardiac            Opacification Agent (Both Spectral and Color Flow Doppler were            utilized during procedure). Indications:    Stroke I63.9  History:        Patient has no prior history of Echocardiogram examinations.                 Risk Factors:Hypertension, Diabetes and Dyslipidemia. ESRD (end                 stage renal disease) on dialysis [facility]),                 Acute stroke due to ischemia [facility]).  Sonographer:    Celesta Gentile RCS Referring Phys: [phone] Kinnedy Mongiello A Terilyn Sano IMPRESSIONS  1. Left ventricular ejection fraction, by estimation, is 60  to 65%. The left ventricle has normal function. The left ventricle has no regional wall motion abnormalities. There is mild left ventricular hypertrophy. Left ventricular diastolic parameters were normal.  2. Right ventricular systolic function is normal. The right ventricular size is normal. Tricuspid regurgitation signal is inadequate for assessing PA pressure.  3. Left atrial size was moderately dilated.  4. The mitral valve is normal in structure. Trivial mitral valve regurgitation. No evidence of mitral stenosis.  5. The aortic valve is tricuspid. Aortic valve regurgitation is not visualized. No aortic  stenosis is present.  6. The inferior vena cava is normal in size with greater than 50% respiratory variability, suggesting right atrial pressure of 3 mmHg. FINDINGS  Left Ventricle: Left ventricular ejection fraction, by estimation, is 60 to 65%. The left ventricle has normal function. The left ventricle has no regional wall motion abnormalities. Definity contrast agent was given IV to delineate the left ventricular  endocardial borders. Strain imaging was not performed. The left ventricular internal cavity size was normal in size. There is mild left ventricular hypertrophy. Left ventricular diastolic parameters were normal. Right Ventricle: The right ventricular size is normal. Right vetricular wall thickness was not well visualized. Right ventricular systolic function is normal. Tricuspid regurgitation signal is inadequate for assessing PA pressure. Left Atrium: Left atrial size was moderately dilated. Right Atrium: Right atrial size was normal in size. Pericardium: There is no evidence of pericardial effusion. Mitral Valve: The mitral valve is normal in structure. Trivial mitral valve regurgitation. No evidence of mitral valve stenosis. Tricuspid Valve: The tricuspid valve is normal in structure. Tricuspid valve regurgitation is not demonstrated. No evidence of tricuspid stenosis. Aortic Valve: The aortic valve is tricuspid. Aortic valve regurgitation is not visualized. No aortic stenosis is present. Aortic valve mean gradient measures 6.0 mmHg. Aortic valve peak gradient measures 10.1 mmHg. Aortic valve area, by VTI measures 3.29  cm. Pulmonic Valve: The pulmonic valve was not well visualized. Pulmonic valve regurgitation is not visualized. No evidence of pulmonic stenosis. Aorta: The aortic root is normal in size and structure. Venous: The inferior vena cava is normal in size with greate MD Electronically signed by Dina Rich MD Signature Date/Time: 12/19/2023/1:28:40 PM    Final     SIGNED: Kendell Bane, MD, FHM. FAAFP. [facility] - [facility] Time spent - 55 min.  In seeing, evaluating and examining the patient. Reviewing medical records, labs, drawn plan of care. [facility],  Pager (please use [email or web address] to page/ text) Please use Epic Secure Chat for non-urgent communication (7AM-7PM)  If 7PM-7AM, please contact night-coverage [email or web address], 12/20/2023, 11:54 AM

## 2023-12-21 DIAGNOSIS — I639 Cerebral infarction, unspecified: Secondary | ICD-10-CM | POA: Diagnosis not present

## 2023-12-21 LAB — GLUCOSE, CAPILLARY
Glucose-Capillary: 262 mg/dL — ABNORMAL HIGH (ref 70–99)
Glucose-Capillary: 340 mg/dL — ABNORMAL HIGH (ref 70–99)
Glucose-Capillary: 263 mg/dL — ABNORMAL HIGH (ref 70–99)
Glucose-Capillary: 257 mg/dL — ABNORMAL HIGH (ref 70–99)
Glucose-Capillary: 204 mg/dL — ABNORMAL HIGH (ref 70–99)

## 2023-12-21 LAB — BASIC METABOLIC PANEL
Anion gap: 12 (ref 5–15)
BUN: 37 mg/dL — ABNORMAL HIGH (ref 8–23)
CO2: 22 mmol/L (ref 22–32)
Calcium: 8.8 mg/dL — ABNORMAL LOW (ref 8.9–10.3)
Chloride: 95 mmol/L — ABNORMAL LOW (ref 98–111)
Creatinine, Ser: 2.74 mg/dL — ABNORMAL HIGH (ref 0.61–1.24)
GFR, Estimated: 24 mL/min — ABNORMAL LOW (ref >60)
Glucose, Bld: 349 mg/dL — ABNORMAL HIGH (ref 70–99)
Potassium: 4.1 mmol/L (ref 3.5–5.1)
Sodium: 129 mmol/L — ABNORMAL LOW (ref 135–145)

## 2023-12-21 LAB — MAGNESIUM: Magnesium: 1.8 mg/dL (ref 1.7–2.4)

## 2023-12-21 MED ORDER — CHLORHEXIDINE GLUCONATE CLOTH 2 % EX PADS
6.0000 | MEDICATED_PAD | Freq: Every day | CUTANEOUS | Status: DC
  Administered 2023-12-22 – 2023-12-23 (×2): 6 via TOPICAL

## 2023-12-21 MED ORDER — INSULIN GLARGINE-YFGN 100 UNIT/ML ~~LOC~~ SOLN
35.0000 [IU] | Freq: Every day | SUBCUTANEOUS | Status: DC
  Administered 2023-12-22: 35 [IU] via SUBCUTANEOUS
  Filled 2023-12-21 (×3): qty 0.35

## 2023-12-21 MED ORDER — INSULIN ASPART 100 UNIT/ML IJ SOLN
0.0000 [IU] | Freq: Every day | INTRAMUSCULAR | Status: DC
  Administered 2023-12-21: 2 [IU] via SUBCUTANEOUS

## 2023-12-21 MED ORDER — INSULIN ASPART 100 UNIT/ML IJ SOLN
0.0000 [IU] | Freq: Three times a day (TID) | INTRAMUSCULAR | Status: DC
  Administered 2023-12-21: 11 [IU] via SUBCUTANEOUS
  Administered 2023-12-22 (×2): 4 [IU] via SUBCUTANEOUS
  Administered 2023-12-23: 3 [IU] via SUBCUTANEOUS

## 2023-12-21 MED ORDER — LOPERAMIDE HCL 2 MG PO CAPS
4.0000 mg | ORAL_CAPSULE | Freq: Once | ORAL | Status: AC
  Administered 2023-12-21: 4 mg via ORAL
  Filled 2023-12-21: qty 2

## 2023-12-21 MED ORDER — DEXAMETHASONE 4 MG PO TABS
4.0000 mg | ORAL_TABLET | Freq: Two times a day (BID) | ORAL | Status: DC
  Administered 2023-12-21 – 2023-12-23 (×5): 4 mg via ORAL
  Filled 2023-12-21 (×5): qty 1

## 2023-12-21 NOTE — Progress Notes (Signed)
 PROGRESS NOTE    Patient: Philip Benjamin                            PCP: Kirstie Peri, MD                    DOB: 17-Mar-1951            DOA: 12/18/2023 ZOX:[id number]             DOS: 12/21/2023, 12:56 PM   LOS: 3 days   Date of Service: The patient was seen and examined on 12/21/2023  Subjective:   The patient was seen and examined this morning, reporting improved right arm weakness, no changes in the right leg weakness.  Still having difficulty ambulating. Hemodynamically stable   Pending CIR approval  Brief Narrative:   MERCURY ROCK is a 73-year-old male with extensive history of DM2, HTN, HLD,ESRD on HD presenting with chief complaint of weakness.  Patient reports right-sided weakness in the past 48 hours, right upper and lower extremity.  No change in speech or visual deficits.   States that his weakness, symptoms are gradually worsened but not significantly, no improvement. He has completed dialysis yesterday   ED: Blood pressure 115/76, pulse 63, temperature 98.2 F (36.8 C), RR 15, height 6\' 1"  (1.854 m), weight 86.2 kg, SpO2 100%.   Labs-reviewed CBC CMP, creatinine 3.02, troponin 52, 52, hemoglobin 5.6, hematocrit 10.0, Respiratory panel, influenza A/B, SARS-CoV-2, RSV all negative Chest x-ray within normal limits  MRI of the brain: IMPRESSION: 1. 3 mm acute infarct within the left lentiform nucleus. 2. Background parenchymal atrophy, advanced chronic small vessel ischemic disease and chronic infarcts, as detailed. 3. Several nonspecific chronic microhemorrhages scattered within the supratentorial and infratentorial brain. 4. Small-volume fluid within bilateral mastoid air cells. 5. Asymmetric T2 hyperintense signal within the right petrous apex, potentially reflecting trapped fluid.  EDP requested patient to be admitted for acute stroke workup    Assessment & Plan:   Principal Problem:   Acute stroke due to ischemia [facility]) Active Problems:   T2DM (type 2  diabetes mellitus) (HCC)   BPH (benign prostatic hyperplasia)   ESRD (end stage renal disease) on dialysis [facility])   Essential hypertension   Mixed hyperlipidemia   Glaucoma   Generalized weakness   Anemia in chronic kidney disease     Assessment and Plan: * Acute stroke due to ischemia [facility]) -Currently on telemetry, only bradycardia, no other dysrhythmia was noted -Neurochecks improved strength in right upper extremity, minimal changes in the right lower extremity.  -MRI of the brain reviewed: Acute on chronic infarct on the left lentiform nucleus, chronic microhemorrhages scattered within the supratentorial and infratentorial area, -Bilateral carotid studies - negative for any significant stenosis -MRA:No evidence of intracranial large vessel occlusion or high-grade stenosis.  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 Melynda Ripple  and neurosurgery on-call Dr. Wynetta Emery -who recommends surgery in the future-close follow-up. Dr. Wynetta Emery recommends -trial of oral steroids, and follow-up as an outpatient No intervention recommended at this point   - Echo: Reviewed; mild LVH, ejection fraction 60 to 65%  -Neurology: Recommended continue statin, aspirin  -BP meds were held for permissive hypertension, will be resumed accordingly  -Consulted  PT/OT-recommending CIR, pending approval - S/p Speecheval: -Passed swallow evaluation , -Labs within normal limits LDL 28, folate 32.4, B12 690, TSH 2.5,  ESRD (end stage renal  disease) on dialysis [facility]) -Appreciate nephrology managing inpatient HD -regular outpatient treatments on MWF -Hemodialysis 2/26, 2/28   T2DM (type 2 diabetes mellitus) (HCC) -Hyperglycemia due to p.o. steroids Titrating long-acting insulin, adjusting SSI -Will check CBG q. ACH S with SSI coverage -Last A1c 5.6  BPH (benign prostatic hyperplasia) - Monitoring urine output,  -Continue Flomax  Anemia in chronic kidney  disease Monitoring H&H, remained stable  Generalized weakness -  PT OT -recommending inpatient rehab   Glaucoma - With history of increased intraocular pressure,  - Continue the use of latanoprost and also dorzolamide/timolol. -Continue outpatient follow-up with ophthalmology service.  Mixed hyperlipidemia - Initiating high-dose statins -Follow-up with lipid panel, with LDL goal of less than 70 LDL 28  Essential hypertension - Currently on metoprolol-will be continued    -------------------------------------------------------------------------------------------------------------------------- Nutritional status:  The patient's BMI is: Body mass index is 25.01 kg/m. I agree with the assessment and plan as outlined  ------------------------------------------------------------------------------------------------------------------------------------------------  DVT prophylaxis:  heparin injection 5,000 Units Start: 12/18/23 1400 SCD's Start: 12/18/23 1218   Code Status:   Code Status: Full Code  Family Communication: Discussed with daughter on the phone  -Advance care planning has been discussed.   Admission status:   Status is: Inpatient Remains inpatient appropriate because: Continue stroke workup   Disposition: From  - home             Planning for discharge on Monday, 12/22/2023 to CIR  Procedures:   No admission procedures for hospital encounter.   Antimicrobials:  Anti-infectives (From admission, onward)    None        Medication:    stroke: early stages of recovery book   Does not apply Once   aspirin  81 mg Oral Daily   atorvastatin  40 mg Oral Daily   Chlorhexidine Gluconate Cloth  6 each Topical Q0600   cycloSPORINE  1 drop Both Eyes BID   dexamethasone  4 mg Oral Q12H   dorzolamide-timolol  1 drop Both Eyes BID   feeding supplement (NEPRO CARB STEADY)  237 mL Oral BID BM   FLUoxetine  20 mg Oral Daily   heparin  5,000 Units Subcutaneous Q8H    insulin aspart  0-20 Units Subcutaneous TID WC   insulin aspart  0-5 Units Subcutaneous QHS   [START ON 12/22/2023] insulin glargine-yfgn  35 Units Subcutaneous Daily   latanoprost  1 drop Left Eye QHS   multivitamin  1 tablet Oral QHS   tamsulosin  0.4 mg Oral Q supper    acetaminophen **OR** acetaminophen (TYLENOL) oral liquid 160 mg/5 mL **OR** acetaminophen, ALPRAZolam, alteplase, anticoagulant sodium citrate, heparin, senna-docusate   Objective:   Vitals:   12/20/23 2229 12/21/23 0630 12/21/23 0815 12/21/23 0817  BP: 138/73 (!) 146/66 (!) 145/69   Pulse: (!) 56 (!) 49 (!) 52   Resp: 20 13  20   Temp: 97.9 F (36.6 C) 97.8 F (36.6 C) 98.2 F (36.8 C)   TempSrc:  Oral Oral   SpO2: 100% 98% 98%   Weight:      Height:        Intake/Output Summary (Last 24 hours) at 12/21/2023 1256 Last data filed at 12/21/2023 0817 Gross per 24 hour  Intake 720 ml  Output 901 ml  Net -181 ml   Filed Weights   12/18/23 0850 12/19/23 1645 12/19/23 1955  Weight: 86.2 kg 86.8 kg 86 kg     Physical examination:     General:  AAO x 3,  cooperative,  no distress;   HEENT:  Normocephalic, PERRL, otherwise with in Normal limits   Neuro:  Right upper, lower extremity weakness 3/5 Speech intact, cognition intact, negative for any facial asymmetry CNII-XII intact. , normal sensation, reflexes intact   Lungs:   Clear to auscultation BL, Respirations unlabored,  No wheezes / crackles  Cardio:    S1/S2, RRR, No murmure, No Rubs or Gallops   Abdomen:  Soft, non-tender, bowel sounds active all four quadrants, no guarding or peritoneal signs.  Muscular  skeletal:  Right upper and lower extremity weakness,  Otherwise Limited exam -global generalized weaknesses - in bed, able to move all 4 extremities,   2+ pulses,  symmetric, No pitting edema  Skin:  Dry, warm to touch, negative for any Rashes,  Wounds: Please see nursing documentation           -----------------------------------------------------------------------------------------------------------------------------    LABs:     Latest Ref Rng & Units 12/20/2023    9:30 AM 12/18/2023    9:22 AM 10/10/2023    4:14 AM  CBC  WBC 4.0 - 10.5 K/uL 7.0  5.6  7.7   Hemoglobin 13.0 - 17.0 g/dL 9.0  40.9  8.7   Hematocrit 39.0 - 52.0 % 28.5  31.8  28.5   Platelets 150 - 400 K/uL 131  129  213       Latest Ref Rng & Units 12/20/2023    9:30 AM 12/18/2023    9:22 AM 10/10/2023    4:14 AM  CMP  Glucose 70 - 99 mg/dL 811  99  97   BUN 8 - 23 mg/dL 21  20  23    Creatinine 0.61 - 1.24 mg/dL 9.14  7.82  9.56   Sodium 135 - 145 mmol/L 133  137  135   Potassium 3.5 - 5.1 mmol/L 3.7  3.5  3.8   Chloride 98 - 111 mmol/L 97  98  100   CO2 22 - 32 mmol/L 25  27  25    Calcium 8.9 - 10.3 mg/dL 8.7  9.3  8.6   Total Protein 6.5 - 8.1 g/dL   5.4   Total Bilirubin <1.2 mg/dL   0.4   Alkaline Phos 38 - 126 U/L   71   AST 15 - 41 U/L   35   ALT 0 - 44 U/L   59        Micro Results Recent Results (from the past 240 hours)  Resp panel by RT-PCR (RSV, Flu A&B, Covid) Anterior Nasal Swab     Status: None   Collection Time: 12/18/23  9:05 AM   Specimen: Anterior Nasal Swab  Result Value Ref Range Status   SARS Coronavirus 2 by RT PCR NEGATIVE NEGATIVE Final    Comment: (NOTE) SARS-CoV-2 target nucleic acids are NOT DETECTED.  The SARS-CoV-2 RNA is generally detectable in upper respiratory specimens during the acute phase of infection. The lowest concentration of SARS-CoV-2 viral copies this assay can detect is 138 copies/mL. A negative result does not preclude SARS-Cov-2 infection and should not be used as the sole basis for treatment or other patient management decisions. A negative result may occur with  improper specimen collection/handling, submission of specimen other than nasopharyngeal swab, presence of viral mutation(s) within the areas targeted by this assay, and inadequate  number of viral copies(<138 copies/mL). A negative result must be combined with clinical observations, patient history, and epidemiological information. The expected result is Negative.  Fact Sheet for Patients:  BloggerCourse.com  Fact Sheet for Healthcare Providers:  SeriousBroker.it  This test is no t yet approved or cleared by the [place] FDA and  has been authorized for detection and/or diagnosis of SARS-CoV-2 by FDA under an Emergency Use Authorization (EUA). This EUA will remain  in effect (meaning this test can be used) for the duration of the COVID-19 declaration under Section 564(b)(1) of the Act, 21 U.S.C.section 360bbb-3(b)(1), unless the authorization is terminated  or revoked sooner.       Influenza A by PCR NEGATIVE NEGATIVE Final   Influenza B by PCR NEGATIVE NEGATIVE Final    Comment: (NOTE) The Xpert Xpress SARS-CoV-2/FLU/RSV plus assay is intended as an aid in the diagnosis of influenza from Nasopharyngeal swab specimens and should not be used as a sole basis for treatment. Nasal washings and aspirates are unacceptable for Xpert Xpress SARS-CoV-2/FLU/RSV testing.  Fact Sheet for Patients: BloggerCourse.com  Fact Sheet for Healthcare Providers: SeriousBroker.it  This test is not yet approved or cleared by the [place] FDA and has been authorized for detection and/or diagnosis of SARS-CoV-2 by FDA under an Emergency Use Authorization (EUA). This EUA will remain in effect (meaning this test can be used) for the duration of the COVID-19 declaration under Section 564(b)(1) of the Act, 21 U.S.C. section 360bbb-3(b)(1), unless the authorization is terminated or revoked.     Resp Syncytial Virus by PCR NEGATIVE NEGATIVE Final    Comment: (NOTE) Fact Sheet for Patients: BloggerCourse.com  Fact Sheet for Healthcare  Providers: SeriousBroker.it  This test is not yet approved or cleared by the [place] FDA and has been authorized for detection and/or diagnosis of SARS-CoV-2 by FDA under an Emergency Use Authorization (EUA). This EUA will remain in effect (meaning this test can be used) for the duration of the COVID-19 declaration under Section 564(b)(1) of the Act, 21 U.S.C. section 360bbb-3(b)(1), unless the authorization is terminated or revoked.  Performed at [facility], [address], [place], [place] [address]     Radiology Reports ECHOCARDIOGRAM COMPLETE Result Date: 12/19/2023    ECHOCARDIOGRAM REPORT   Patient Name:   RAYNER ERMAN Date of Exam: 12/19/2023 Medical Rec #:  [id number]        Height:       73.0 in Accession #:    [id number]       Weight:       190.0 lb Date of Birth:  1951/07/22       BSA:          2.105 m Patient Age:    72 years         BP:           132/66 mmHg Patient Gender: M                HR:           62 bpm. [name] Procedure: 2D Echo, Cardiac Doppler, Color Doppler and Intracardiac            Opacification Agent (Both Spectral and Color Flow Doppler were            utilized during procedure). Indications:    Stroke I63.9  History:        Patient has no prior history of Echocardiogram examinations.                 Risk Factors:Hypertension, Diabetes and Dyslipidemia. ESRD (end  stage renal disease) on dialysis [facility]),                 Acute stroke due to ischemia [facility]).  Sonographer:    Celesta Gentile RCS Referring Phys: [phone] Tory Mckissack A Emma Birchler IMPRESSIONS  1. Left ventricular ejection fraction, by estimation, is 60 to 65%. The left ventricle has normal function. The left ventricle has no regional wall motion abnormalities. There is mild left ventricular hypertrophy. Left ventricular diastolic parameters were normal.  2. Right ventricular systolic function is normal. The right ventricular size is normal. Tricuspid  regurgitation signal is inadequate for assessing PA pressure.  3. Left atrial size was moderately dilated.  4. The mitral valve is normal in structure. Trivial mitral valve regurgitation. No evidence of mitral stenosis.  5. The aortic valve is tricuspid. Aortic valve regurgitation is not visualized. No aortic stenosis is present.  6. The inferior vena cava is normal in size with greater than 50% respiratory variability, suggesting right atrial pressure of 3 mmHg. FINDINGS  Left Ventricle: Left ventricular ejection fraction, by estimation, is 60 to 65%. The left ventricle has normal function. The left ventricle has no regional wall motion abnormalities. Definity contrast agent was given IV to delineate the left ventricular  endocardial borders. Strain imaging was not performed. The left ventricular internal cavity size was normal in size. There is mild left ventricular hypertrophy. Left ventricular diastolic parameters were normal. Right Ventricle: The right ventricular size is normal. Right vetricular wall thickness was not well visualized. Right ventricular systolic function is normal. Tricuspid regurgitation signal is inadequate for assessing PA pressure. Left Atrium: Left atrial size was moderately dilated. Right Atrium: Right atrial size was normal in size. Pericardium: There is no evidence of pericardial effusion. Mitral Valve: The mitral valve is normal in structure. Trivial mitral valve regurgitation. No evidence of mitral valve stenosis. Tricuspid Valve: The tricuspid valve is normal in structure. Tricuspid valve regurgitation is not demonstrated. No evidence of tricuspid stenosis. Aortic Valve: The aortic valve is tricuspid. Aortic valve regurgitation is not visualized. No aortic stenosis is present. Aortic valve mean gradient measures 6.0 mmHg. Aortic valve peak gradient measures 10.1 mmHg. Aortic valve area, by VTI measures 3.29  cm. Pulmonic Valve: The pulmonic valve was not well visualized. Pulmonic  valve regurgitation is not visualized. No evidence of pulmonic stenosis. Aorta: The aortic root is normal in size and structure. Venous: The inferior vena cava is normal in size with greate MD Electronically signed by Dina Rich MD Signature Date/Time: 12/19/2023/1:28:40 PM    Final     SIGNED: Kendell Bane, MD, FHM. FAAFP. [facility] - [facility] Time spent - 55 min.  In seeing, evaluating and examining the patient. Reviewing medical records, labs, drawn plan of care. [facility],  Pager (please use [email or web address] to page/ text) Please use Epic Secure Chat for non-urgent communication (7AM-7PM)  If 7PM-7AM, please contact night-coverage [email or web address], 12/21/2023, 12:56 PM

## 2023-12-21 NOTE — PMR Pre-admission (Signed)
 PMR Admission Coordinator Pre-Admission Assessment  Patient: Philip Benjamin is an 73 y.o., male MRN: [id number] DOB: 1951/10/03 Height: 6\' 1"  (185.4 cm) Weight: 86 kg  Insurance Information HMO:     PPO:      PCP:      IPA:      80/20:      OTHER:  PRIMARY: Medicare A and B      Policy#: [id number] Subscriber:  CM Name:       Phone#:      Fax#:  Pre-Cert#: verified Health and safety inspector:  Benefits:  Phone #:      Name:  Eff. Date: part a and  Part B 04/21/2007   Deduct: $1632      Out of Pocket Max: n/a      Life Max: n/a CIR: 100%      SNF: 20 full days Outpatient:      Co-Pay:  Home Health: 100%      Co-Pay:  DME:      Co-Pay:  Providers:  SECONDARY: aarp      Policy#: [id number]     Phone#:   Financial Counselor:       Phone#:   The "Data Collection Information Summary" for patients in Inpatient Rehabilitation Facilities with attached "Privacy Act Statement-Health Care Records" was provided and verbally reviewed with: Patient  Emergency Contact Information Contact Information     Name Relation Home Work Mobile   Encinas,[name] Daughter   [phone]      Other Contacts   None on File     Current Medical History  Patient Admitting Diagnosis: CVA Philip Benjamin is a 73 year old right-handed male with history significant for type 2 diabetes mellitus, anemia of chronic disease, BPH, hypertension, hyperlipidemia, end-stage renal disease with hemodialysis Monday Wednesday and Friday, glaucoma of the left eye/legally blind.  Per chart review patient lives with spouse and children.  Two-level home bed and bath main level and 5 steps to entry.  Household ambulator with rolling walker.  Daughter and brother can provide assistance on discharge.  Presented to [facility] 12/18/2023 with right side weakness x 48 hours.  Patient with recent admission 09/29/2023 - 10/10/2023 secondary to general malaise, nausea and weakness with nonproductive cough and was discharged to skilled  nursing facility ambulating min mod assist 30 feet.  At that time workup demonstrated positive COVID-19 with mild dehydration and hypokalemia.  Patient also with recent admission 08/21/2023 - 08/25/2023 treated for sepsis secondary to Pseudomonas UTI and lobar pneumonia.  He was treated with IV cefepime and discharged with Cipro.  During patient's latest admission noted acute cholecystitis concern for abscess CT of abdomen showed distended gallbladder with pericholecystic stranding underwent cholecystostomy tube placement with drainage of abscess 12/16 completing course of Zosyn and was discharged for short time to skilled nursing facility before returning home.  Upon present admission of 12/18/2023 due to right sided weakness MRI of the brain showed a 3 mm acute infarct within the left lentiform nucleus.  Background parenchymal atrophy, advanced chronic small vessel ischemic disease and chronic infarcts.  Several nonspecific chronic microhemorrhages scattered within the supratentorial and infratentorial brain.  Carotid ultrasound without significant plaque formation.  MRA showed no evidence of intracranial large vessel occlusion or stenosis.  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  No findings to suggest acute disc herniation.  Multilevel foraminal stenosis greatest and severe  on the left at C4-5 and C5-6.  Echocardiogram with ejection fraction of 60 to 65%.  The left ventricle showing no regional wall motion abnormalities.  Admission chemistries SARS coronavirus negative, BUN 20, creatinine 3.02, hemoglobin 10, hemoglobin A1c 5.6.  Neurology follow-up maintained on low-dose aspirin.  Subcutaneous heparin initiated for DVT prophylaxis.  Renal service follow-up with hemodialysis as directed.  Dr. 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  Patient is tolerating a regular consistency diet.  Therapy evaluations completed due to patient decreased functional mobility and right-sided weakness was admitted for a comprehensive rehab program.   Complete NIHSS TOTAL: 0  Patient's medical record from [facility] has been reviewed by the rehabilitation admission coordinator and physician.  Past Medical History  Past Medical History:  Diagnosis Date   BPH (benign prostatic hyperplasia)    Chronic kidney disease    ckd stage 3   Diabetes mellitus without complication (HCC)    Hypertension     Has the patient had major surgery during 100 days prior to admission? Yes  Family History   family history is not on file.  Current Medications  Current Facility-Administered Medications:     stroke: early stages of recovery book, , Does not apply, Once, Shahmehdi, Seyed A, MD   acetaminophen (TYLENOL) tablet 650 mg, 650 mg, Oral, Q4H PRN, 650 mg at 12/19/23 2140 **OR** acetaminophen (TYLENOL) 160 MG/5ML solution 650 mg, 650 mg, Per Tube, Q4H PRN **OR** acetaminophen (TYLENOL) suppository 650 mg, 650 mg, Rectal, Q4H PRN, Shahmehdi, Seyed A, MD   ALPRAZolam (XANAX) tablet 0.25 mg, 0.25 mg, Oral, TID PRN, Shahmehdi, Seyed A, MD   alteplase (CATHFLO ACTIVASE) injection 2 mg, 2 mg, Intracatheter, Once PRN, Ethelene Hal, MD   anticoagulant sodium citrate solution 5 mL, 5 mL, Intracatheter, PRN, Ethelene Hal, MD   aspirin chewable tablet 81 mg, 81 mg, Oral, Daily, Lindie Spruce O, MD, 81 mg at 12/21/23 0829   atorvastatin (LIPITOR) tablet 40 mg, 40 mg, Oral, Daily, Shahmehdi, Seyed A, MD, 40 mg at 12/20/23 2245   Chlorhexidine Gluconate Cloth 2 % PADS 6 each, 6 each, Topical, Q0600, Ethelene Hal, MD, 6 each at 12/21/23 [phone]   cycloSPORINE (RESTASIS) 0.05 % ophthalmic emulsion 1 drop, 1 drop, Both Eyes, BID, Shahmehdi, Seyed A, MD, 1  drop at 12/21/23 0835   dexamethasone (DECADRON) tablet 4 mg, 4 mg, Oral, Q12H, Shahmehdi, Seyed A, MD, 4 mg at 12/21/23 1403   dorzolamide-timolol (COSOPT) 2-0.5 % ophthalmic solution 1 drop, 1 drop, Both Eyes, BID, Shahmehdi, Seyed A, MD, 1 drop at 12/21/23 0828   feeding supplement (NEPRO CARB STEADY) liquid 237 mL, 237 mL, Oral, BID BM, Shahmehdi, Seyed A, MD, 237 mL at 12/21/23 1402   FLUoxetine (PROZAC) capsule 20 mg, 20 mg, Oral, Daily, Shahmehdi, Seyed A, MD, 20 mg at 12/21/23 0829   heparin injection 1,000 Units, 1,000 Units, Intracatheter, PRN, Ethelene Hal, MD   heparin injection 5,000 Units, 5,000 Units, Subcutaneous, Q8H, Shahmehdi, Seyed A, MD, 5,000 Units at 12/21/23 1402   insulin aspart (novoLOG) injection 0-20 Units, 0-20 Units, Subcutaneous, TID WC, Shahmehdi, Seyed A, MD   insulin aspart (novoLOG) injection 0-5 Units, 0-5 Units, Subcutaneous, QHS, Shahmehdi, Seyed A, MD   [START ON 12/22/2023] insulin glargine-yfgn (SEMGLEE) injection 35 Units, 35 Units, Subcutaneous, Daily, Shahmehdi, Gemma Payor, MD  latanoprost (XALATAN) 0.005 % ophthalmic solution 1 drop, 1 drop, Left Eye, QHS, Shahmehdi, Seyed A, MD, 1 drop at 12/20/23 2246   multivitamin (RENA-VIT) tablet 1 tablet, 1 tablet, Oral, QHS, Shahmehdi, Seyed A, MD, 1 tablet at 12/20/23 2246   senna-docusate (Senokot-S) tablet 1 tablet, 1 tablet, Oral, QHS PRN, Shahmehdi, Seyed A, MD   tamsulosin (FLOMAX) capsule 0.4 mg, 0.4 mg, Oral, Q supper, Shahmehdi, Seyed A, MD, 0.4 mg at 12/20/23 1700  Patients Current Diet:  Diet Order             Diet renal with fluid restriction Fluid restriction: 1200 mL Fluid; Room service appropriate? Yes; Fluid consistency: Thin  Diet effective now                   Precautions / Restrictions Precautions Precautions: Fall Restrictions Weight Bearing Restrictions Per Provider Order: No   Has the patient had 2 or more falls or a fall with injury in the past year? Yes  Prior Activity  Level Community (5-7x/wk): Pt. active in the community PTA  Prior Functional Level Self Care: Did the patient need help bathing, dressing, using the toilet or eating? Needed some help  Indoor Mobility: Did the patient need assistance with walking from room to room (with or without device)? Needed some help  Stairs: Did the patient need assistance with internal or external stairs (with or without device)? Needed some help  Functional Cognition: Did the patient need help planning regular tasks such as shopping or remembering to take medications? Needed some help  Patient Information Are you of Hispanic, Latino/a,or Spanish origin?: A. No, not of Hispanic, Latino/a, or Spanish origin What is your race?: B. Black or African American Do you need or want an interpreter to communicate with a doctor or health care staff?: 0. No  Patient's Response To:  Health Literacy and Transportation Is the patient able to respond to health literacy and transportation needs?: Yes Health Literacy - How often do you need to have someone help you when you read instructions, pamphlets, or other written material from your doctor or pharmacy?: Never In the past 12 months, has lack of transportation kept you from medical appointments or from getting medications?: No In the past 12 months, has lack of transportation kept you from meetings, work, or from getting things needed for daily living?: No  Home Assistive Devices / Equipment Home Equipment: Agricultural consultant (2 wheels), Shower seat, Hand held shower head, BSC/3in1  Prior Device Use: Indicate devices/aids used by the patient prior to current illness, exacerbation or injury? Manual wheelchair and Walker  Current Functional Level Cognition  Orientation Level: Oriented X4    Extremity Assessment (includes Sensation/Coordination)  Upper Extremity Assessment: Defer to OT evaluation RUE Deficits / Details: 2+/5 bilateral shoulder flexion and abduction; 5/5 elbow  flexion and extension; 4/5 wrist flexion and extension, 4-/5 grip. RUE Sensation: decreased light touch, decreased proprioception RUE Coordination: decreased fine motor, decreased gross motor LUE Deficits / Details: 2+/5 shoulder flexion and abduction; generally weak othrwise but noted poor propriocepition for finger to nose test. LUE Sensation: decreased proprioception LUE Coordination: decreased gross motor  Lower Extremity Assessment: Generalized weakness    ADLs  Overall ADL's : Needs assistance/impaired Eating/Feeding: Sitting, Set up Grooming: Moderate assistance, Sitting Upper Body Bathing: Moderate assistance, Sitting Lower Body Bathing: Moderate assistance, Sitting/lateral leans Upper Body Dressing : Moderate assistance, Sitting Lower Body Dressing: Moderate assistance, Sitting/lateral leans Toilet Transfer: Moderate assistance, Maximal assistance, Stand-pivot, Ambulation, Rolling walker (2  wheels) Toilet Transfer Details (indicate cue type and reason): Simulated via EOB to chair transfer and brief ambulation in the room. Toileting- Clothing Manipulation and Hygiene: Moderate assistance, Sitting/lateral lean Tub/ Shower Transfer: Moderate assistance, Maximal assistance, Rolling walker (2 wheels), Stand-pivot Functional mobility during ADLs: Moderate assistance, Maximal assistance, Rolling walker (2 wheels) General ADL Comments: Able to ambulate ~10 to 15 feet in total from the chair and back with RW.    Mobility  Overal bed mobility: Needs Assistance Bed Mobility: Supine to Sit Supine to sit: Contact guard General bed mobility comments: slow labored movement    Transfers  Overall transfer level: Needs assistance Equipment used: Rolling walker (2 wheels) Transfers: Sit to/from Stand, Bed to chair/wheelchair/BSC Sit to Stand: Mod assist Bed to/from chair/wheelchair/BSC transfer type:: Step pivot Step pivot transfers: Mod assist, Max assist General transfer comment: labored  movement; poor weight bearing through R LE    Ambulation / Gait / Stairs / Wheelchair Mobility  Ambulation/Gait Ambulation/Gait assistance: Mod assist, Max assist Gait Distance (Feet): 10 Feet Assistive device: Rolling walker (2 wheels) Gait Pattern/deviations: Knees buckling, Step-to pattern, Decreased step length - right, Decreased step length - left, Decreased stride length, Narrow base of support General Gait Details: Slow, labored movement with increased difficutly to move RLE, R knee started to buckle leading to ambulation being discontinued for patient safety Gait velocity: slow    Posture / Balance Dynamic Sitting Balance Sitting balance - Comments: poor to fair Balance Overall balance assessment: Needs assistance Sitting-balance support: Feet supported, No upper extremity supported Sitting balance-Leahy Scale: Poor Sitting balance - Comments: poor to fair Postural control: Posterior lean Standing balance support: Bilateral upper extremity supported, During functional activity, Reliant on assistive device for balance Standing balance-Leahy Scale: Poor Standing balance comment: using RW    Special needs/care consideration Dialysis: Hemodialysis Monday, Wednesday, and Friday   Previous Home Environment (from acute therapy documentation) Living Arrangements: Spouse/significant other, Children  Lives With: Daughter Available Help at Discharge: Family, Available PRN/intermittently Type of Home: House Home Layout: Two level, Laundry or work area in basement, Able to live on main level with bedroom/bathroom, Full bath on main level Alternate Level Stairs-Rails: Right Alternate Level Stairs-Number of Steps: 5 Home Access: Stairs to enter Entrance Stairs-Rails: None Entrance Stairs-Number of Steps: 5 Bathroom Shower/Tub: Associate Professor: Yes How Accessible: Accessible via walker Home Care Services: Yes Type of Home Care  Services: Home RN, Home OT, Home PT Home Care Agency (if known): [facility] Additional Comments: per PT note  Discharge Living Setting Plans for Discharge Living Setting: Patient's home Type of Home at Discharge: House Discharge Home Layout: Two level Alternate Level Stairs-Rails: Right Alternate Level Stairs-Number of Steps: 5 Discharge Home Access: Stairs to enter Entrance Stairs-Rails: Right Entrance Stairs-Number of Steps: 5 Discharge Bathroom Shower/Tub: Tub/shower unit Discharge Bathroom Toilet: Standard Discharge Bathroom Accessibility: Yes Does the patient have any problems obtaining your medications?: No  Social/Family/Support Systems Patient Roles: Other (Comment) Contact Information: [phone] Anticipated Caregiver: Edgar Frisk Anticipated Caregiver's Contact Information: Daughter works nights but Pt.'s son can stay while she works Ability/Limitations of Caregiver: Min A Caregiver Availability: 24/7 Discharge Plan Discussed with Primary Caregiver: Yes Is Caregiver In Agreement with Plan?: Yes Does Caregiver/Family have Issues with Lodging/Transportation while Pt is in Rehab?: Yes  Goals Patient/Family Goal for Rehab: PT/OT min A Pt/Family Agrees to Admission and willing to participate: Yes Program Orientation Provided & Reviewed with Pt/Caregiver Including Roles  & Responsibilities: Yes  Decrease burden  of Care through IP rehab admission:  not anticipated  Possible need for SNF placement upon discharge: not anticipated  Patient Condition: I have reviewed medical records from [facility], spoken with CM, and patient. I met with patient at the bedside for inpatient rehabilitation assessment.  Patient will benefit from ongoing PT and OT, can actively participate in 3 hours of therapy a day 5 days of the week, and can make measurable gains during the admission.  Patient will also benefit from the coordinated team approach during an Inpatient Acute  Rehabilitation admission.  The patient will receive intensive therapy as well as Rehabilitation physician, nursing, social worker, and care management interventions.  Due to safety, skin/wound care, disease management, medication administration, pain management, and patient education the patient requires 24 hour a day rehabilitation nursing.  The patient is currently min A with mobility and basic ADLs.  Discharge setting and therapy post discharge at home with home health is anticipated.  Patient has agreed to participate in the Acute Inpatient Rehabilitation Program and will admit today.  Preadmission Screen Completed By:  Jeronimo Greaves, 12/21/2023 2:09 PM ______________________________________________________________________   Discussed status with Dr. Berline Chough on 12/23/23 at 1004 and received approval for admission today.  Admission Coordinator:  Jeronimo Greaves, CCC-SLP, time 1004/Date 12/23/23   Assessment/Plan: Diagnosis: L lentiform stroke  Does the need for close, 24 hr/day Medical supervision in concert with the patient's rehab needs make it unreasonable for this patient to be served in a less intensive setting? 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 Yes Does the patient require coordinated care of a physician, rehab nurse, PT, OT, and SLP to address physical and functional deficits in the context of the above medical diagnosis(es)? Yes Addressing deficits in the following areas: balance, endurance, locomotion, strength, transferring, bowel/bladder control, bathing, dressing, feeding, grooming, toileting, and cognition Can the patient actively participate in an intensive therapy program of at least 3 hrs of therapy 5 days a  week? Yes The potential for patient to make measurable gains while on inpatient rehab is good Anticipated functional outcomes upon discharge from inpatient rehab: supervision and min assist PT, supervision and min assist OT, n/a SLP Estimated rehab length of stay to reach the above functional goals is: 2-3 weeks Anticipated discharge destination: Home 10. Overall Rehab/Functional Prognosis: good   MD Signature:

## 2023-12-21 NOTE — Plan of Care (Signed)
   Problem: Education: Goal: Knowledge of disease or condition will improve Outcome: Progressing Goal: Knowledge of secondary prevention will improve (MUST DOCUMENT ALL) Outcome: Progressing Goal: Knowledge of patient specific risk factors will improve (DELETE if not current risk factor) Outcome: Progressing   Problem: Ischemic Stroke/TIA Tissue Perfusion: Goal: Complications of ischemic stroke/TIA will be minimized Outcome: Progressing

## 2023-12-21 NOTE — Plan of Care (Signed)
 ?  Problem: Coping: ?Goal: Level of anxiety will decrease ?Outcome: Progressing ?  ?Problem: Safety: ?Goal: Ability to remain free from injury will improve ?Outcome: Progressing ?  ?

## 2023-12-21 NOTE — Plan of Care (Signed)
  Problem: Education: Goal: Knowledge of disease or condition will improve 12/21/2023 0512 by Luciana Axe, Lorella Nimrod, RN Outcome: Progressing 12/21/2023 0021 by Luciana Axe, Lorella Nimrod, RN Outcome: Progressing Goal: Knowledge of secondary prevention will improve (MUST DOCUMENT ALL) 12/21/2023 0512 by Luciana Axe, Lorella Nimrod, RN Outcome: Progressing 12/21/2023 0021 by Luciana Axe, Lorella Nimrod, RN Outcome: Progressing Goal: Knowledge of patient specific risk factors will improve (DELETE if not current risk factor) 12/21/2023 0512 by Luciana Axe, Lorella Nimrod, RN Outcome: Progressing 12/21/2023 0021 by Luciana Axe, Lorella Nimrod, RN Outcome: Progressing   Problem: Ischemic Stroke/TIA Tissue Perfusion: Goal: Complications of ischemic stroke/TIA will be minimized 12/21/2023 0512 by Luciana Axe, Lorella Nimrod, RN Outcome: Progressing 12/21/2023 0021 by Luciana Axe, Lorella Nimrod, RN Outcome: Progressing

## 2023-12-22 DIAGNOSIS — I639 Cerebral infarction, unspecified: Secondary | ICD-10-CM | POA: Diagnosis not present

## 2023-12-22 LAB — COMPREHENSIVE METABOLIC PANEL
ALT: 45 U/L — ABNORMAL HIGH (ref 0–44)
AST: 39 U/L (ref 15–41)
Albumin: 2.9 g/dL — ABNORMAL LOW (ref 3.5–5.0)
Alkaline Phosphatase: 86 U/L (ref 38–126)
Anion gap: 9 (ref 5–15)
BUN: 50 mg/dL — ABNORMAL HIGH (ref 8–23)
CO2: 24 mmol/L (ref 22–32)
Calcium: 8.6 mg/dL — ABNORMAL LOW (ref 8.9–10.3)
Chloride: 97 mmol/L — ABNORMAL LOW (ref 98–111)
Creatinine, Ser: 2.78 mg/dL — ABNORMAL HIGH (ref 0.61–1.24)
GFR, Estimated: 23 mL/min — ABNORMAL LOW (ref >60)
Glucose, Bld: 276 mg/dL — ABNORMAL HIGH (ref 70–99)
Potassium: 3.9 mmol/L (ref 3.5–5.1)
Sodium: 130 mmol/L — ABNORMAL LOW (ref 135–145)
Total Bilirubin: 0.3 mg/dL (ref 0.0–1.2)
Total Protein: 5.6 g/dL — ABNORMAL LOW (ref 6.5–8.1)

## 2023-12-22 LAB — CBC
HCT: 27.4 % — ABNORMAL LOW (ref 39.0–52.0)
Hemoglobin: 8.9 g/dL — ABNORMAL LOW (ref 13.0–17.0)
MCH: 30.3 pg (ref 26.0–34.0)
MCHC: 32.5 g/dL (ref 30.0–36.0)
MCV: 93.2 fL (ref 80.0–100.0)
Platelets: 148 10*3/uL — ABNORMAL LOW (ref 150–400)
RBC: 2.94 MIL/uL — ABNORMAL LOW (ref 4.22–5.81)
RDW: 11.5 % (ref 11.5–15.5)
WBC: 8.5 10*3/uL (ref 4.0–10.5)
nRBC: 0 % (ref 0.0–0.2)

## 2023-12-22 LAB — GLUCOSE, CAPILLARY
Glucose-Capillary: 199 mg/dL — ABNORMAL HIGH (ref 70–99)
Glucose-Capillary: 158 mg/dL — ABNORMAL HIGH (ref 70–99)
Glucose-Capillary: 153 mg/dL — ABNORMAL HIGH (ref 70–99)

## 2023-12-22 LAB — MAGNESIUM: Magnesium: 1.8 mg/dL (ref 1.7–2.4)

## 2023-12-22 MED ORDER — LOPERAMIDE HCL 2 MG PO CAPS: 2.0000 mg | ORAL_CAPSULE | ORAL | Status: DC | PRN

## 2023-12-22 MED ORDER — HEPARIN SODIUM (PORCINE) 1000 UNIT/ML IJ SOLN
INTRAMUSCULAR | Status: AC
Start: 2023-12-22 — End: ?
  Filled 2023-12-22: qty 4

## 2023-12-22 MED ORDER — ANTICOAGULANT SODIUM CITRATE 4% (200MG/5ML) IV SOLN: 5.0000 mL | Status: DC | PRN

## 2023-12-22 MED ORDER — HEPARIN SODIUM (PORCINE) 1000 UNIT/ML DIALYSIS: 1000.0000 [IU] | INTRAMUSCULAR | Status: DC | PRN

## 2023-12-22 NOTE — Procedures (Signed)
 HD Note:  Some information was entered later than the data was gathered due to patient care needs. The stated time with the data is accurate.  Received patient in bed to unit.   Alert and oriented.   Informed consent signed and in chart.   Access used: Right upper chest HD catheter Access issues: None  Patient tolerated treatment well.   TX duration: 3.5 hours   Alert, without acute distress.  Total UF removed: 1000 ml  Hand-off given to patient's nurse.   Transported back to the room   Maykel Reitter L. Dareen Piano, RN Kidney Dialysis Unit.

## 2023-12-22 NOTE — Progress Notes (Signed)
 PROGRESS NOTE    DALANTE MINUS  UEA:[id number] DOB: 26-Jan-1951 DOA: 12/18/2023 PCP: Kirstie Peri, MD   Brief Narrative:    Philip Benjamin is a 73-year-old male with extensive history of DM2, HTN, HLD,ESRD on HD presenting with chief complaint of weakness.  Patient reports right-sided weakness in the past 48 hours, right upper and lower extremity.  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  He is awaiting placement to CIR.  Assessment & Plan:   Principal Problem:   Acute stroke due to ischemia [facility]) Active Problems:   T2DM (type 2 diabetes mellitus) (HCC)   BPH (benign prostatic hyperplasia)   ESRD (end stage renal disease) on dialysis [facility])   Essential hypertension   Mixed hyperlipidemia   Glaucoma   Generalized weakness   Anemia in chronic kidney disease  Assessment and Plan:  Acute stroke due to ischemia [facility]) -Currently on telemetry, only bradycardia, no other dysrhythmia was noted -Neurochecks improved strength in right upper extremity, minimal changes in the right lower extremity.   -MRI of the brain reviewed: Acute on chronic infarct on the left lentiform nucleus, chronic microhemorrhages scattered within the supratentorial and infratentorial area, -Bilateral carotid studies - negative for any significant stenosis -MRA:No evidence of intracranial large vessel occlusion or high-grade stenosis.   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 Melynda Ripple  and neurosurgery on-call Dr. Wynetta Emery -who recommends surgery in the future-close follow-up. Dr. Wynetta Emery recommends -trial of oral steroids, and follow-up as an outpatient No intervention recommended at this point     - Echo: Reviewed; mild LVH, ejection fraction 60 to 65%   -Neurology: Recommended continue statin, aspirin  -BP meds were held for  permissive hypertension, will be resumed accordingly   -Consulted  PT/OT-recommending CIR, pending approval - S/p Speecheval: -Passed swallow evaluation , -Labs within normal limits LDL 28, folate 32.4, B12 690, TSH 2.5,  Acute diarrhea -Check GI pathogen panel -Imodium ordered as needed   ESRD (end stage renal disease) on dialysis (HCC) -Appreciate nephrology managing inpatient HD -regular outpatient treatments on MWF -Hemodialysis planned for today     T2DM (type 2 diabetes mellitus) (HCC) -Hyperglycemia due to p.o. steroids Titrating long-acting insulin, adjusting SSI -Will check CBG q. ACH S with SSI coverage -Last A1c 5.6   BPH (benign prostatic hyperplasia) - Monitoring urine output,  -Continue Flomax   Anemia in chronic kidney disease Monitoring H&H, remained stable   Generalized weakness -  PT OT -recommending inpatient rehab     Glaucoma - With history of increased intraocular pressure,  - Continue the use of latanoprost and also dorzolamide/timolol. -Continue outpatient follow-up with ophthalmology service.   Mixed hyperlipidemia - Initiating high-dose statins -Follow-up with lipid panel, with LDL goal of less than 70 LDL 28   Essential hypertension - Currently on metoprolol-will be continued  DVT prophylaxis:Heparin Code Status: Full Family Communication: None at bedside Disposition Plan:  Status is: Inpatient Remains inpatient appropriate because: Need for placement.  Consultants:  Neurology Neurosurgery Nephrology  Procedures:  None  Antimicrobials:  None  Subjective: Patient seen and evaluated today with complaints of diarrhea overnight.  He states he has had 6 episodes of diarrhea last night which kept him up all night.  Denies any significant abdominal pain, nausea, or vomiting.  Objective: Vitals:   12/22/23 1030 12/22/23 1100 12/22/23  1130 12/22/23 1200  BP: (!) 148/72 (!) 147/72 138/69 132/67  Pulse: (!) 57 69 69 60  Resp: (!)  9 12 12 17   Temp:      TempSrc:      SpO2: 100% 100% 100% 100%  Weight:      Height:        Intake/Output Summary (Last 24 hours) at 12/22/2023 1233 Last data filed at 12/22/2023 0900 Gross per 24 hour  Intake 600 ml  Output 950 ml  Net -350 ml   Filed Weights   12/19/23 1645 12/19/23 1955 12/22/23 1006  Weight: 86.8 kg 86 kg 87.2 kg    Examination:  General exam: Appears calm and comfortable  Respiratory system: Clear to auscultation. Respiratory effort normal. Cardiovascular system: S1 & S2 heard, RRR.  Gastrointestinal system: Abdomen is soft Central nervous system: Alert and awake Extremities: No edema Skin: No significant lesions noted Psychiatry: Flat affect.    Data Reviewed: I have personally reviewed following labs and imaging studies  CBC: Recent Labs  Lab 12/18/23 0922 12/20/23 0930 12/22/23 1146  WBC 5.6 7.0 8.5  HGB 10.0* 9.0* 8.9*  HCT 31.8* 28.5* 27.4*  MCV 96.4 95.0 93.2  PLT 129* 131* 148*   Basic Metabolic Panel: Recent Labs  Lab 12/18/23 0922 12/20/23 0930 12/21/23 1331 12/22/23 1118 12/22/23 1146  NA 137 133* 129* 130*  --   K 3.5 3.7 4.1 3.9  --   CL 98 97* 95* 97*  --   CO2 27 25 22 24   --   GLUCOSE 99 284* 349* 276*  --   BUN 20 21 37* 50*  --   CREATININE 3.02* 2.44* 2.74* 2.78*  --   CALCIUM 9.3 8.7* 8.8* 8.6*  --   MG  --   --  1.8  --  1.8  PHOS  --  2.7  --   --   --    GFR: Estimated Creatinine Clearance: 26.3 mL/min (A) (by C-G formula based on SCr of 2.78 mg/dL (H)). Liver Function Tests: Recent Labs  Lab 12/20/23 0930 12/22/23 1118  AST  --  39  ALT  --  45*  ALKPHOS  --  86  BILITOT  --  0.3  PROT  --  5.6*  ALBUMIN 3.1* 2.9*   No results for input(s): "LIPASE", "AMYLASE" in the last 168 hours. No results for input(s): "AMMONIA" in the last 168 hours. Coagulation Profile: No results for input(s): "INR", "PROTIME" in the last 168 hours. Cardiac Enzymes: No results for input(s): "CKTOTAL", "CKMB",  "CKMBINDEX", "TROPONINI" in the last 168 hours. BNP (last 3 results) No results for input(s): "PROBNP" in the last 8760 hours. HbA1C: No results for input(s): "HGBA1C" in the last 72 hours. CBG: Recent Labs  Lab 12/21/23 1135 12/21/23 1558 12/21/23 1645 12/21/23 2023 12/22/23 0714  GLUCAP 340* 263* 257* 204* 199*   Lipid Profile: No results for input(s): "CHOL", "HDL", "LDLCALC", "TRIG", "CHOLHDL", "LDLDIRECT" in the last 72 hours. Thyroid Function Tests: No results for input(s): "TSH", "T4TOTAL", "FREET4", "T3FREE", "THYROIDAB" in the last 72 hours. Anemia Panel: No results for input(s): "VITAMINB12", "FOLATE", "FERRITIN", "TIBC", "IRON", "RETICCTPCT" in the last 72 hours. Sepsis Labs: No results for input(s): "PROCALCITON", "LATICACIDVEN" in the last 168 hours.  Recent Results (from the past 240 hours)  Resp panel by RT-PCR (RSV, Flu A&B, Covid) Anterior Nasal Swab     Status: None   Collection Time: 12/18/23  9:05 AM   Specimen: Anterior Nasal Swab  Result  Value Ref Range Status   SARS Coronavirus 2 by RT PCR NEGATIVE NEGATIVE Final    Comment: (NOTE) SARS-CoV-2 target nucleic acids are NOT DETECTED.  The SARS-CoV-2 RNA is generally detectable in upper respiratory specimens during the acute phase of infection. The lowest concentration of SARS-CoV-2 viral copies this assay can detect is 138 copies/mL. A negative result does not preclude SARS-Cov-2 infection and should not be used as the sole basis for treatment or other patient management decisions. A negative result may occur with  improper specimen collection/handling, submission of specimen other than nasopharyngeal swab, presence of viral mutation(s) within the areas targeted by this assay, and inadequate number of viral copies(<138 copies/mL). A negative result must be combined with clinical observations, patient history, and epidemiological information. The expected result is Negative.  Fact Sheet for Patients:   BloggerCourse.com  Fact Sheet for Healthcare Providers:  SeriousBroker.it  This test is no t yet approved or cleared by the [place] FDA and  has been authorized for detection and/or diagnosis of SARS-CoV-2 by FDA under an Emergency Use Authorization (EUA). This EUA will remain  in effect (meaning this test can be used) for the duration of the COVID-19 declaration under Section 564(b)(1) of the Act, 21 U.S.C.section 360bbb-3(b)(1), unless the authorization is terminated  or revoked sooner.       Influenza A by PCR NEGATIVE NEGATIVE Final   Influenza B by PCR NEGATIVE NEGATIVE Final    Comment: (NOTE) The Xpert Xpress SARS-CoV-2/FLU/RSV plus assay is intended as an aid in the diagnosis of influenza from Nasopharyngeal swab specimens and should not be used as a sole basis for treatment. Nasal washings and aspirates are unacceptable for Xpert Xpress SARS-CoV-2/FLU/RSV testing.  Fact Sheet for Patients: BloggerCourse.com  Fact Sheet for Healthcare Providers: SeriousBroker.it  This test is not yet approved or cleared by the [place] FDA and has been authorized for detection and/or diagnosis of SARS-CoV-2 by FDA under an Emergency Use Authorization (EUA). This EUA will remain in effect (meaning this test can be used) for the duration of the COVID-19 declaration under Section 564(b)(1) of the Act, 21 U.S.C. section 360bbb-3(b)(1), unless the authorization is terminated or revoked.     Resp Syncytial Virus by PCR NEGATIVE NEGATIVE Final    Comment: (NOTE) Fact Sheet for Patients: BloggerCourse.com  Fact Sheet for Healthcare Providers: SeriousBroker.it  This test is not yet approved or cleared by the [place] FDA and has been authorized for detection and/or diagnosis of SARS-CoV-2 by FDA under an Emergency Use  Authorization (EUA). This EUA will remain in effect (meaning this test can be used) for the duration of the COVID-19 declaration under Section 564(b)(1) of the Act, 21 U.S.C. section 360bbb-3(b)(1), unless the authorization is terminated or revoked.  Performed at [facility], [address], [place], [place] [address]          Radiology Studies: No results found.      Scheduled Meds:   stroke: early stages of recovery book   Does not apply Once   aspirin  81 mg Oral Daily   atorvastatin  40 mg Oral Daily   Chlorhexidine Gluconate Cloth  6 each Topical Q0600   Chlorhexidine Gluconate Cloth  6 each Topical Q0600   cycloSPORINE  1 drop Both Eyes BID   dexamethasone  4 mg Oral Q12H   dorzolamide-timolol  1 drop Both Eyes BID   feeding supplement (NEPRO CARB STEADY)  237 mL Oral BID BM   FLUoxetine  20 mg  Oral Daily   heparin  5,000 Units Subcutaneous Q8H   insulin aspart  0-20 Units Subcutaneous TID WC   insulin aspart  0-5 Units Subcutaneous QHS   insulin glargine-yfgn  35 Units Subcutaneous Daily   latanoprost  1 drop Left Eye QHS   multivitamin  1 tablet Oral QHS   tamsulosin  0.4 mg Oral Q supper   Continuous Infusions:  anticoagulant sodium citrate     anticoagulant sodium citrate       LOS: 4 days    Time spent: 35 minutes    Erionna Strum Hoover Brunette, DO [facility]  If 7PM-7AM, please contact night-coverage [email or web address] 12/22/2023, 12:33 PM

## 2023-12-22 NOTE — Progress Notes (Signed)
 Patient ID: Philip Benjamin, male   DOB: 1951/01/14, 73 y.o.   MRN: [id number] S: No new complaints. O:BP (!) 140/68   Pulse (!) 58   Temp 98 F (36.7 C) (Oral)   Resp 11   Ht 6\' 1"  (1.854 m)   Wt 87.2 kg   SpO2 100%   BMI 25.36 kg/m   Intake/Output Summary (Last 24 hours) at 12/22/2023 1008 Last data filed at 12/22/2023 0900 Gross per 24 hour  Intake 600 ml  Output 950 ml  Net -350 ml   Intake/Output: I/O last 3 completed shifts: In: 720 [P.O.:720] Out: 1850 [Urine:1850]  Intake/Output this shift:  Total I/O In: 120 [P.O.:120] Out: -  Weight change:  Gen: NAD CVS: RRR Resp:CTA Abd: +BS, soft, NT/ND Ext: no edema  Recent Labs  Lab 12/18/23 0922 12/20/23 0930 12/21/23 1331  NA 137 133* 129*  K 3.5 3.7 4.1  CL 98 97* 95*  CO2 27 25 22   GLUCOSE 99 284* 349*  BUN 20 21 37*  CREATININE 3.02* 2.44* 2.74*  ALBUMIN  --  3.1*  --   CALCIUM 9.3 8.7* 8.8*  PHOS  --  2.7  --    Liver Function Tests: Recent Labs  Lab 12/20/23 0930  ALBUMIN 3.1*   No results for input(s): "LIPASE", "AMYLASE" in the last 168 hours. No results for input(s): "AMMONIA" in the last 168 hours. CBC: Recent Labs  Lab 12/18/23 0922 12/20/23 0930  WBC 5.6 7.0  HGB 10.0* 9.0*  HCT 31.8* 28.5*  MCV 96.4 95.0  PLT 129* 131*   Cardiac Enzymes: No results for input(s): "CKTOTAL", "CKMB", "CKMBINDEX", "TROPONINI" in the last 168 hours. CBG: Recent Labs  Lab 12/21/23 1135 12/21/23 1558 12/21/23 1645 12/21/23 2023 12/22/23 0714  GLUCAP 340* 263* 257* 204* 199*    Iron Studies: No results for input(s): "IRON", "TIBC", "TRANSFERRIN", "FERRITIN" in the last 72 hours. Studies/Results: No results found.   stroke: early stages of recovery book   Does not apply Once   aspirin  81 mg Oral Daily   atorvastatin  40 mg Oral Daily   Chlorhexidine Gluconate Cloth  6 each Topical Q0600   Chlorhexidine Gluconate Cloth  6 each Topical Q0600   cycloSPORINE  1 drop Both Eyes BID   dexamethasone   4 mg Oral Q12H   dorzolamide-timolol  1 drop Both Eyes BID   feeding supplement (NEPRO CARB STEADY)  237 mL Oral BID BM   FLUoxetine  20 mg Oral Daily   heparin  5,000 Units Subcutaneous Q8H   insulin aspart  0-20 Units Subcutaneous TID WC   insulin aspart  0-5 Units Subcutaneous QHS   insulin glargine-yfgn  35 Units Subcutaneous Daily   latanoprost  1 drop Left Eye QHS   multivitamin  1 tablet Oral QHS   tamsulosin  0.4 mg Oral Q supper    BMET    Component Value Date/Time   NA 129 (L) 12/21/2023 1331   K 4.1 12/21/2023 1331   CL 95 (L) 12/21/2023 1331   CO2 22 12/21/2023 1331   GLUCOSE 349 (H) 12/21/2023 1331   BUN 37 (H) 12/21/2023 1331   CREATININE 2.74 (H) 12/21/2023 1331   CALCIUM 8.8 (L) 12/21/2023 1331   GFRNONAA 24 (L) 12/21/2023 1331   GFRAA 44 (L) 01/14/2018 1112   CBC    Component Value Date/Time   WBC 7.0 12/20/2023 0930   RBC 3.00 (L) 12/20/2023 0930   HGB 9.0 (L) 12/20/2023 0930  HCT 28.5 (L) 12/20/2023 0930   PLT 131 (L) 12/20/2023 0930   MCV 95.0 12/20/2023 0930   MCH 30.0 12/20/2023 0930   MCHC 31.6 12/20/2023 0930   RDW 11.7 12/20/2023 0930   LYMPHSABS 1.5 10/10/2023 0414   MONOABS 1.3 (H) 10/10/2023 0414   EOSABS 0.2 10/10/2023 0414   BASOSABS 0.0 10/10/2023 0414    Outpatient HD orders: [facility], MWF. 4 hours. EDW 86.5 kg. TDC (has appt coming up with [facility] on 10/07/23 for perm access consideration). Flow rates: 350/500. 2K, 2.5Ca. Heparin: 1000 units/hr. Meds: Mircera 60 mcg every 4 weeks (last dose 12/08/23), Venofer 50mg  every Monday.   Assessment/Plan: ESRD - HD per MWF schedule; orders written for dialysis today to keep on outpatient schedule.  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  Patient is at goal.   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  Will check a phos for binder management.  H/O Cholecystitis  - S/p Surgery last admission with IR placed cholecystostomy tube and drain on 10/06/23 which has since been removed.  No complaints of fever, chills, nausea, abdominal pain but sometimes his appetite is not great which he believes is because of the cholecystitis.   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  Neurosurgery made aware and recommended close follow up and surgery in the future.  Trial of steroids for now  Disposition - per primary team.  Recently out of rehab and back to home.    Irena Cords, MD [employer] [phone]

## 2023-12-22 NOTE — H&P (Signed)
 Physical Medicine and Rehabilitation Admission H&P    Chief Complaint  Patient presents with   Weakness  : HPI: Philip Benjamin is a 73 year old right-handed male with history significant for type 2 diabetes mellitus, anemia of chronic disease, BPH, hypertension, hyperlipidemia, end-stage renal disease with hemodialysis Monday Wednesday and Friday, glaucoma of the left eye/legally blind.  Per chart review patient lives with spouse and children.  Two-level home bed and bath main level and 5 steps to entry.  Household ambulator with rolling walker.  Daughter and brother can provide assistance on discharge.  Presented to [facility] 12/18/2023 with right side weakness x 48 hours.  Patient with recent admission 09/29/2023 - 10/10/2023 secondary to general malaise, nausea and weakness with nonproductive cough and was discharged to skilled nursing facility ambulating min mod assist 30 feet.  At that time workup demonstrated positive COVID-19 with mild dehydration and hypokalemia.  Patient also with recent admission 08/21/2023 - 08/25/2023 treated for sepsis secondary to Pseudomonas UTI and lobar pneumonia.  He was treated with IV cefepime and discharged with Cipro.  During patient's latest admission noted acute cholecystitis concern for abscess CT of abdomen showed distended gallbladder with pericholecystic stranding underwent cholecystostomy tube placement with drainage of abscess 12/16 completing course of Zosyn and was discharged for short time to skilled nursing facility before returning home.  Upon present admission of 12/18/2023 due to right sided weakness MRI of the brain showed a 3 mm acute infarct within the left lentiform nucleus.  Background parenchymal atrophy, advanced chronic small vessel ischemic disease and chronic infarcts.  Several nonspecific chronic microhemorrhages scattered within the supratentorial and infratentorial brain.  Carotid ultrasound without significant plaque formation.   MRA showed no evidence of intracranial large vessel occlusion or stenosis.  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  No findings to suggest acute disc herniation.  Multilevel foraminal stenosis greatest and severe on the left at C4-5 and C5-6.  Echocardiogram with ejection fraction of 60 to 65%.  The left ventricle showing no regional wall motion abnormalities.  Admission chemistries SARS coronavirus negative, BUN 20, creatinine 3.02, hemoglobin 10, hemoglobin A1c 5.6.  Neurology follow-up maintained on low-dose aspirin.  Subcutaneous heparin initiated for DVT prophylaxis.  Renal service follow-up with hemodialysis as directed.  Dr. Wynetta Emery of neurosurgery consulted in regards to findings of severe spinal canal stenosis cord compression C3-4 placed on trial of oral steroids and close follow-up with possible need for surgical intervention.  Patient is tolerating a regular consistency diet.  Therapy evaluations completed due to patient decreased functional mobility and right-sided weakness was admitted for a comprehensive rehab program.  Review of Systems  Constitutional:  Positive for malaise/fatigue. Negative for chills and fever.  HENT:  Negative for hearing loss.   Eyes:  Negative for blurred vision and double vision.  Respiratory:  Positive for cough. Negative for shortness of breath and wheezing.   Cardiovascular:  Positive for leg swelling. Negative for chest pain and palpitations.  Gastrointestinal:  Positive for constipation. Negative for heartburn, nausea and vomiting.  Genitourinary:  Negative for dysuria, flank pain and hematuria.  Musculoskeletal:  Positive for joint pain and myalgias.  Skin:  Negative for rash.  Neurological:  Positive for weakness.  Psychiatric/Behavioral:         Anxiety  All other systems reviewed and are negative.  Past Medical History:  Diagnosis Date   BPH  (  benign prostatic hyperplasia)    Chronic kidney disease    ckd stage 3   Diabetes mellitus without complication (HCC)    Hypertension    Past Surgical History:  Procedure Laterality Date   EYE SURGERY     INGUINAL HERNIA REPAIR Bilateral 01/23/2018   Procedure: BILATERAL OPEN INGUINAL HERNIA REPAIR WITH MESH;  Surgeon: Jimmye Norman, MD;  Location: [facility] OR;  Service: General;  Laterality: Bilateral;   INSERTION OF MESH Bilateral 01/23/2018   Procedure: INSERTION OF MESH;  Surgeon: Jimmye Norman, MD;  Location: [facility] OR;  Service: General;  Laterality: Bilateral;   IR FLUORO GUIDE CV LINE RIGHT  08/15/2023   IR PATIENT EVAL TECH 0-60 MINS  11/18/2023   IR PERC CHOLECYSTOSTOMY  10/06/2023   IR US GUIDE BX ASP/DRAIN  10/06/2023   IR US GUIDE VASC ACCESS RIGHT  08/15/2023   VASECTOMY     ~6962   History reviewed. No pertinent family history. Social History:  reports that he has never smoked. He has never used smokeless tobacco. He reports that he does not drink alcohol and does not use drugs. Allergies: No Known Allergies Medications Prior to Admission  Medication Sig Dispense Refill   acetaminophen (TYLENOL) 650 MG CR tablet Take 650 mg by mouth every 6 (six) hours as needed for pain.     ALPRAZolam (XANAX) 0.25 MG tablet Take 1 tablet (0.25 mg total) by mouth 3 (three) times daily as needed for anxiety. 10 tablet 0   atorvastatin (LIPITOR) 20 MG tablet Take 1 tablet by mouth daily.     Cholecalciferol 125 MCG (5000 UT) capsule Take by mouth.     cycloSPORINE (RESTASIS) 0.05 % ophthalmic emulsion SMARTSIG:In Eye(s)     Dorzolamide HCl-Timolol Mal PF 2-0.5 % SOLN Apply 1 drop to eye 2 (two) times daily.     Fluoxetine HCl, PMDD, 20 MG TABS Take 1 tablet by mouth daily.     folic acid (FOLVITE) 800 MCG tablet Take 800 mcg by mouth daily.     LANTUS 100 UNIT/ML injection Inject 26 Units into the skin daily.     latanoprost (XALATAN) 0.005 % ophthalmic solution Place 1 drop into both eyes at  bedtime. (Patient taking differently: Place 1 drop into the left eye at bedtime.)     multivitamin (RENA-VIT) TABS tablet Take 1 tablet by mouth at bedtime.     nystatin cream (MYCOSTATIN) Apply 1 application topically 2 (two) times daily as needed. FOR YEAST OR SKIN RASHES.  2   ondansetron (ZOFRAN) 4 MG tablet Take by mouth 2 (two) times daily.     tamsulosin (FLOMAX) 0.4 MG CAPS capsule Take 0.4 mg by mouth daily with supper.  2      Home: Home Living Family/patient expects to be discharged to:: Private residence Living Arrangements: Spouse/significant other, Children Available Help at Discharge: Family, Available PRN/intermittently Type of Home: House Home Access: Stairs to enter Secretary/administrator of Steps: 5 Entrance Stairs-Rails: None Home Layout: Two level, Laundry or work area in basement, Able to live on main level with bedroom/bathroom, Full bath on main level Alternate Level Stairs-Number of Steps: 5 Alternate Level Stairs-Rails: Right Bathroom Shower/Tub: Engineer, manufacturing systems: Standard Bathroom Accessibility: Yes Home Equipment: Agricultural consultant (2 wheels), Shower seat, Hand held shower head, BSC/3in1 Additional Comments: per PT note  Lives With: Daughter   Functional History: Prior Function Prior Level of Function : Needs assist Physical Assist : ADLs (physical) ADLs (physical): Dressing, Bathing, Toileting, IADLs Mobility Comments: houshold  ambulatory with RW, but decreased ability in the last 2 weeks. ADLs Comments: Assist for bathing, dressing, toileting. Able to groom and feed.  Functional Status:  Mobility: Bed Mobility Overal bed mobility: Needs Assistance Bed Mobility: Supine to Sit Supine to sit: Contact guard General bed mobility comments: slow labored movement Transfers Overall transfer level: Needs assistance Equipment used: Rolling walker (2 wheels) Transfers: Sit to/from Stand, Bed to chair/wheelchair/BSC Sit to Stand: Mod  assist Bed to/from chair/wheelchair/BSC transfer type:: Step pivot Step pivot transfers: Mod assist, Max assist General transfer comment: labored movement; poor weight bearing through R LE Ambulation/Gait Ambulation/Gait assistance: Mod assist, Max assist Gait Distance (Feet): 10 Feet Assistive device: Rolling walker (2 wheels) Gait Pattern/deviations: Knees buckling, Step-to pattern, Decreased step length - right, Decreased step length - left, Decreased stride length, Narrow base of support General Gait Details: Slow, labored movement with increased difficutly to move RLE, R knee started to buckle leading to ambulation being discontinued for patient safety Gait velocity: slow    ADL: ADL Overall ADL's : Needs assistance/impaired Eating/Feeding: Sitting, Set up Grooming: Moderate assistance, Sitting Upper Body Bathing: Moderate assistance, Sitting Lower Body Bathing: Moderate assistance, Sitting/lateral leans Upper Body Dressing : Moderate assistance, Sitting Lower Body Dressing: Moderate assistance, Sitting/lateral leans Toilet Transfer: Moderate assistance, Maximal assistance, Stand-pivot, Ambulation, Rolling walker (2 wheels) Toilet Transfer Details (indicate cue type and reason): Simulated via EOB to chair transfer and brief ambulation in the room. Toileting- Clothing Manipulation and Hygiene: Moderate assistance, Sitting/lateral lean Tub/ Shower Transfer: Moderate assistance, Maximal assistance, Rolling walker (2 wheels), Stand-pivot Functional mobility during ADLs: Moderate assistance, Maximal assistance, Rolling walker (2 wheels) General ADL Comments: Able to ambulate ~10 to 15 feet in total from the chair and back with RW.  Cognition: Cognition Orientation Level: Oriented X4 Cognition Arousal: Alert Behavior During Therapy: WFL for tasks assessed/performed  Physical Exam: Blood pressure (!) 164/83, pulse (!) 55, temperature 98 F (36.7 C), temperature source Oral, resp.  rate 20, height 6\' 1"  (1.854 m), weight 86 kg, SpO2 100%. Physical Exam Neurological:     Comments: Patient is alert.  Answers basic questions.  Limited medical historian.  Follows simple commands     Results for orders placed or performed during the hospital encounter of 12/18/23 (from the past 48 hours)  Glucose, capillary     Status: Abnormal   Collection Time: 12/20/23  7:27 AM  Result Value Ref Range   Glucose-Capillary 286 (H) 70 - 99 mg/dL    Comment: Glucose reference range applies only to samples taken after fasting for at least 8 hours.  Renal function panel     Status: Abnormal   Collection Time: 12/20/23  9:30 AM  Result Value Ref Range   Sodium 133 (L) 135 - 145 mmol/L   Potassium 3.7 3.5 - 5.1 mmol/L   Chloride 97 (L) 98 - 111 mmol/L   CO2 25 22 - 32 mmol/L   Glucose, Bld 284 (H) 70 - 99 mg/dL    Comment: Glucose reference range applies only to samples taken after fasting for at least 8 hours.   BUN 21 8 - 23 mg/dL   Creatinine, Ser 6.96 (H) 0.61 - 1.24 mg/dL   Calcium 8.7 (L) 8.9 - 10.3 mg/dL   Phosphorus 2.7 2.5 - 4.6 mg/dL   Albumin 3.1 (L) 3.5 - 5.0 g/dL   GFR, Estimated 27 (L) >60 mL/min    Comment: (NOTE) Calculated using the CKD-EPI Creatinine Equation (2021)    Anion gap 11 5 -  15    Comment: Performed at [facility], [address], [place], [place] [address]  CBC     Status: Abnormal   Collection Time: 12/20/23  9:30 AM  Result Value Ref Range   WBC 7.0 4.0 - 10.5 K/uL   RBC 3.00 (L) 4.22 - 5.81 MIL/uL   Hemoglobin 9.0 (L) 13.0 - 17.0 g/dL   HCT 65.7 (L) 84.6 - 96.2 %   MCV 95.0 80.0 - 100.0 fL   MCH 30.0 26.0 - 34.0 pg   MCHC 31.6 30.0 - 36.0 g/dL   RDW 95.2 84.1 - 32.4 %   Platelets 131 (L) 150 - 400 K/uL   nRBC 0.0 0.0 - 0.2 %    Comment: Performed at [facility], [address], [place], [place] [address]  Glucose, capillary     Status: Abnormal   Collection Time: 12/20/23 11:32 AM  Result Value Ref Range   Glucose-Capillary 279 (H) 70 -  99 mg/dL    Comment: Glucose reference range applies only to samples taken after fasting for at least 8 hours.  Glucose, capillary     Status: Abnormal   Collection Time: 12/20/23  4:15 PM  Result Value Ref Range   Glucose-Capillary 341 (H) 70 - 99 mg/dL    Comment: Glucose reference range applies only to samples taken after fasting for at least 8 hours.  Glucose, capillary     Status: Abnormal   Collection Time: 12/20/23 10:28 PM  Result Value Ref Range   Glucose-Capillary 310 (H) 70 - 99 mg/dL    Comment: Glucose reference range applies only to samples taken after fasting for at least 8 hours.  Glucose, capillary     Status: Abnormal   Collection Time: 12/21/23  7:26 AM  Result Value Ref Range   Glucose-Capillary 262 (H) 70 - 99 mg/dL    Comment: Glucose reference range applies only to samples taken after fasting for at least 8 hours.  Glucose, capillary     Status: Abnormal   Collection Time: 12/21/23 11:35 AM  Result Value Ref Range   Glucose-Capillary 340 (H) 70 - 99 mg/dL    Comment: Glucose reference range applies only to samples taken after fasting for at least 8 hours.  Basic metabolic panel     Status: Abnormal   Collection Time: 12/21/23  1:31 PM  Result Value Ref Range   Sodium 129 (L) 135 - 145 mmol/L   Potassium 4.1 3.5 - 5.1 mmol/L   Chloride 95 (L) 98 - 111 mmol/L   CO2 22 22 - 32 mmol/L   Glucose, Bld 349 (H) 70 - 99 mg/dL    Comment: Glucose reference range applies only to samples taken after fasting for at least 8 hours.   BUN 37 (H) 8 - 23 mg/dL   Creatinine, Ser 7.25 (H) 0.61 - 1.24 mg/dL   Calcium 8.8 (L) 8.9 - 10.3 mg/dL   GFR, Estimated 24 (L) >60 mL/min    Comment: (NOTE) Calculated using the CKD-EPI Creatinine Equation (2021)    Anion gap 12 5 - 15    Comment: Performed at [facility], [address] [place], [place] [address]  Magnesium     Status: None   Collection Time: 12/21/23  1:31 PM  Result Value Ref Range   Magnesium 1.8 1.7 - 2.4 mg/dL     Comment: Performed at [facility], [address], [place], [place] [address]  Glucose, capillary     Status: Abnormal   Collection Time:  12/21/23  3:58 PM  Result Value Ref Range   Glucose-Capillary 263 (H) 70 - 99 mg/dL    Comment: Glucose reference range applies only to samples taken after fasting for at least 8 hours.   Comment 1 Notify RN    Comment 2 Document in Chart   Glucose, capillary     Status: Abnormal   Collection Time: 12/21/23  4:45 PM  Result Value Ref Range   Glucose-Capillary 257 (H) 70 - 99 mg/dL    Comment: Glucose reference range applies only to samples taken after fasting for at least 8 hours.   Comment 1 Notify RN    Comment 2 Document in Chart   Glucose, capillary     Status: Abnormal   Collection Time: 12/21/23  8:23 PM  Result Value Ref Range   Glucose-Capillary 204 (H) 70 - 99 mg/dL    Comment: Glucose reference range applies only to samples taken after fasting for at least 8 hours.   Comment 1 Notify RN    Comment 2 Document in Chart    No results found.    Blood pressure (!) 164/83, pulse (!) 55, temperature 98 F (36.7 C), temperature source Oral, resp. rate 20, height 6\' 1"  (1.854 m), weight 86 kg, SpO2 100%.  Medical Problem List and Plan: 1. 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  Antithrombotics: -DVT/anticoagulation:  Pharmaceutical: Heparin  -antiplatelet therapy: Aspirin 81 mg daily 3. Pain Management: Tylenol as needed 4. Mood/Behavior/Sleep: Prozac 20 mg daily, Xanax 0.25 mg 3 times daily as needed  -antipsychotic agents: N/A 5. Neuropsych/cognition: This patient is capable of making decisions on his own behalf. 6. Skin/Wound Care: Routine skin checks 7. Fluids/Electrolytes/Nutrition: Routine in and outs with follow-up chemistries 8.  End-stage renal disease.  Hemodialysis per renal services 9.  Type 2 diabetes mellitus, hemoglobin A1c 5.6.  Semglee 35  units daily. 10.  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  Discussed with Dr. Wynetta Emery neurosurgery placed on trial of steroids.  No current intervention at this time.   11.  Anemia of chronic disease.  Follow-up CBC 12.  Glaucoma.  Legally blind left eye.  Continue eyedrops 13.  BPH.  Flomax 0.4 mg daily 14.  Hyperlipidemia.  Lipitor 15.  Hypertension.  Monitor with increased mobility. 16.  Recent cholecystitis.  Status post cholecystostomy tube placement drainage of abscess 12/16.  Follow-up outpatient  Charlton Amor, PA-C 12/22/2023

## 2023-12-22 NOTE — Progress Notes (Signed)
   12/22/23 1352  Vitals  Pulse Rate (!) 58  Resp 16  BP 131/62  SpO2 100 %  O2 Device Nasal Cannula  Oxygen Therapy  O2 Flow Rate (L/min) 2 L/min  Patient Activity (if Appropriate) In bed  Pulse Oximetry Type Continuous  Oximetry Probe Site Changed No  During Treatment Monitoring  HD Safety Checks Performed Yes  Intra-Hemodialysis Comments Tx completed   Received patient in bed to unit.  Alert and oriented.  Informed consent signed and in chart.   TX duration: 3.5 hours  Patient tolerated well.  Transported back to the room  Alert, without acute distress.  Hand-off given to patient's nurse.   Access used: Right Permanent Catheter  Access issues: None  Total UF removed: 1000 Medication(s) given: None    Mar Daring, LPN  [facility]

## 2023-12-22 NOTE — TOC Progression Note (Signed)
 Transition of Care [facility]) - Progression Note    Patient Details  Name: Philip Benjamin MRN: [id number] Date of Birth: 1951-09-12  Transition of [facility] CM/SW Contact  Karn Cassis, [place] Phone Number: 12/22/2023, 10:40 AM  Clinical Narrative: Per CIR, bed should be available for pt tomorrow. MD updated.       Expected Discharge Plan: IP Rehab Facility Barriers to Discharge: Continued Medical Work up  Expected Discharge Plan and Services       Living arrangements for the past 2 months: Single Family Home                                       Social Determinants of Health (SDOH) Interventions SDOH Screenings   Food Insecurity: No Food Insecurity (12/18/2023)  Housing: Low Risk  (12/18/2023)  Transportation Needs: No Transportation Needs (12/18/2023)  Utilities: Not At Risk (12/18/2023)  Social Connections: Moderately Integrated (12/18/2023)  Tobacco Use: Low Risk  (12/18/2023)  Health Literacy: Medium Risk (10/17/2023)   Received from [facility]    Readmission Risk Interventions    12/19/2023    1:50 PM 10/01/2023    7:48 AM 08/22/2023   12:48 PM  Readmission Risk Prevention Plan  Transportation Screening Complete Complete Complete  PCP or Specialist Appt within 3-5 Days Not Complete    Home Care Screening   Complete  Medication Review (RN CM)   Complete  HRI or Home Care Consult Complete Complete   Social Work Consult for Recovery Care Planning/Counseling Complete Complete   Palliative Care Screening Not Applicable Not Applicable   Medication Review Oceanographer) Complete Complete

## 2023-12-22 NOTE — Plan of Care (Signed)
  Problem: Skin Integrity: Goal: Risk for impaired skin integrity will decrease Outcome: Progressing   Problem: Coping: Goal: Level of anxiety will decrease Outcome: Progressing   Problem: Skin Integrity: Goal: Risk for impaired skin integrity will decrease Outcome: Progressing

## 2023-12-22 NOTE — Progress Notes (Signed)
 Inpatient Rehab Admissions Coordinator:    CIR following. HD today. I'm hopeful for transfer to CIR tomorrow.   Megan Salon, MS, CCC-SLP Rehab Admissions Coordinator  [phone] (celll) [phone] (office)

## 2023-12-23 ENCOUNTER — Other Ambulatory Visit: Payer: Self-pay

## 2023-12-23 ENCOUNTER — Inpatient Hospital Stay (HOSPITAL_COMMUNITY)
Admission: AD | Admit: 2023-12-23 | Discharge: 2024-01-15 | DRG: 981 | Disposition: A | Discharge status: Home Health | Source: Intra-hospital | Attending: Physical Medicine and Rehabilitation | Admitting: Physical Medicine and Rehabilitation

## 2023-12-23 DIAGNOSIS — I69341 Monoplegia of lower limb following cerebral infarction affecting right dominant side: Secondary | ICD-10-CM

## 2023-12-23 DIAGNOSIS — I63542 Cerebral infarction due to unspecified occlusion or stenosis of left cerebellar artery: Secondary | ICD-10-CM | POA: Insufficient documentation

## 2023-12-23 DIAGNOSIS — R001 Bradycardia, unspecified: Secondary | ICD-10-CM | POA: Diagnosis not present

## 2023-12-23 DIAGNOSIS — N390 Urinary tract infection, site not specified: Secondary | ICD-10-CM | POA: Diagnosis not present

## 2023-12-23 DIAGNOSIS — F0394 Unspecified dementia, unspecified severity, with anxiety: Secondary | ICD-10-CM | POA: Diagnosis present

## 2023-12-23 DIAGNOSIS — D631 Anemia in chronic kidney disease: Secondary | ICD-10-CM | POA: Diagnosis present

## 2023-12-23 DIAGNOSIS — E11649 Type 2 diabetes mellitus with hypoglycemia without coma: Secondary | ICD-10-CM | POA: Diagnosis not present

## 2023-12-23 DIAGNOSIS — F03911 Unspecified dementia, unspecified severity, with agitation: Secondary | ICD-10-CM | POA: Diagnosis present

## 2023-12-23 DIAGNOSIS — Z992 Dependence on renal dialysis: Secondary | ICD-10-CM | POA: Diagnosis not present

## 2023-12-23 DIAGNOSIS — E11319 Type 2 diabetes mellitus with unspecified diabetic retinopathy without macular edema: Secondary | ICD-10-CM | POA: Diagnosis present

## 2023-12-23 DIAGNOSIS — E1165 Type 2 diabetes mellitus with hyperglycemia: Secondary | ICD-10-CM | POA: Diagnosis not present

## 2023-12-23 DIAGNOSIS — R5381 Other malaise: Secondary | ICD-10-CM | POA: Diagnosis present

## 2023-12-23 DIAGNOSIS — M4802 Spinal stenosis, cervical region: Secondary | ICD-10-CM | POA: Diagnosis present

## 2023-12-23 DIAGNOSIS — R319 Hematuria, unspecified: Secondary | ICD-10-CM | POA: Diagnosis not present

## 2023-12-23 DIAGNOSIS — I63512 Cerebral infarction due to unspecified occlusion or stenosis of left middle cerebral artery: Principal | ICD-10-CM | POA: Diagnosis present

## 2023-12-23 DIAGNOSIS — Z79899 Other long term (current) drug therapy: Secondary | ICD-10-CM

## 2023-12-23 DIAGNOSIS — I69351 Hemiplegia and hemiparesis following cerebral infarction affecting right dominant side: Secondary | ICD-10-CM

## 2023-12-23 DIAGNOSIS — N2581 Secondary hyperparathyroidism of renal origin: Secondary | ICD-10-CM | POA: Diagnosis present

## 2023-12-23 DIAGNOSIS — N3289 Other specified disorders of bladder: Secondary | ICD-10-CM | POA: Diagnosis present

## 2023-12-23 DIAGNOSIS — K5900 Constipation, unspecified: Secondary | ICD-10-CM | POA: Diagnosis present

## 2023-12-23 DIAGNOSIS — K592 Neurogenic bowel, not elsewhere classified: Secondary | ICD-10-CM | POA: Diagnosis present

## 2023-12-23 DIAGNOSIS — E782 Mixed hyperlipidemia: Secondary | ICD-10-CM | POA: Diagnosis not present

## 2023-12-23 DIAGNOSIS — Z8616 Personal history of COVID-19: Secondary | ICD-10-CM

## 2023-12-23 DIAGNOSIS — F411 Generalized anxiety disorder: Secondary | ICD-10-CM | POA: Diagnosis present

## 2023-12-23 DIAGNOSIS — E1122 Type 2 diabetes mellitus with diabetic chronic kidney disease: Secondary | ICD-10-CM | POA: Diagnosis present

## 2023-12-23 DIAGNOSIS — N185 Chronic kidney disease, stage 5: Secondary | ICD-10-CM | POA: Diagnosis not present

## 2023-12-23 DIAGNOSIS — L89156 Pressure-induced deep tissue damage of sacral region: Secondary | ICD-10-CM | POA: Diagnosis present

## 2023-12-23 DIAGNOSIS — R252 Cramp and spasm: Secondary | ICD-10-CM | POA: Diagnosis not present

## 2023-12-23 DIAGNOSIS — Z794 Long term (current) use of insulin: Secondary | ICD-10-CM

## 2023-12-23 DIAGNOSIS — S3730XA Unspecified injury of urethra, initial encounter: Secondary | ICD-10-CM | POA: Diagnosis not present

## 2023-12-23 DIAGNOSIS — N401 Enlarged prostate with lower urinary tract symptoms: Secondary | ICD-10-CM | POA: Diagnosis present

## 2023-12-23 DIAGNOSIS — Z7982 Long term (current) use of aspirin: Secondary | ICD-10-CM

## 2023-12-23 DIAGNOSIS — R339 Retention of urine, unspecified: Secondary | ICD-10-CM | POA: Diagnosis not present

## 2023-12-23 DIAGNOSIS — F4321 Adjustment disorder with depressed mood: Secondary | ICD-10-CM | POA: Diagnosis present

## 2023-12-23 DIAGNOSIS — N400 Enlarged prostate without lower urinary tract symptoms: Secondary | ICD-10-CM | POA: Diagnosis present

## 2023-12-23 DIAGNOSIS — R338 Other retention of urine: Secondary | ICD-10-CM | POA: Diagnosis not present

## 2023-12-23 DIAGNOSIS — F03918 Unspecified dementia, unspecified severity, with other behavioral disturbance: Secondary | ICD-10-CM | POA: Diagnosis present

## 2023-12-23 DIAGNOSIS — E860 Dehydration: Secondary | ICD-10-CM | POA: Diagnosis present

## 2023-12-23 DIAGNOSIS — Z7189 Other specified counseling: Secondary | ICD-10-CM | POA: Diagnosis not present

## 2023-12-23 DIAGNOSIS — E876 Hypokalemia: Secondary | ICD-10-CM | POA: Diagnosis present

## 2023-12-23 DIAGNOSIS — G992 Myelopathy in diseases classified elsewhere: Secondary | ICD-10-CM | POA: Diagnosis present

## 2023-12-23 DIAGNOSIS — I959 Hypotension, unspecified: Secondary | ICD-10-CM | POA: Diagnosis not present

## 2023-12-23 DIAGNOSIS — E785 Hyperlipidemia, unspecified: Secondary | ICD-10-CM | POA: Diagnosis present

## 2023-12-23 DIAGNOSIS — F32A Depression, unspecified: Secondary | ICD-10-CM | POA: Diagnosis present

## 2023-12-23 DIAGNOSIS — N186 End stage renal disease: Secondary | ICD-10-CM | POA: Diagnosis present

## 2023-12-23 DIAGNOSIS — B961 Klebsiella pneumoniae [K. pneumoniae] as the cause of diseases classified elsewhere: Secondary | ICD-10-CM | POA: Diagnosis not present

## 2023-12-23 DIAGNOSIS — H409 Unspecified glaucoma: Secondary | ICD-10-CM | POA: Diagnosis present

## 2023-12-23 DIAGNOSIS — I120 Hypertensive chronic kidney disease with stage 5 chronic kidney disease or end stage renal disease: Secondary | ICD-10-CM | POA: Diagnosis present

## 2023-12-23 DIAGNOSIS — Z515 Encounter for palliative care: Secondary | ICD-10-CM

## 2023-12-23 DIAGNOSIS — I639 Cerebral infarction, unspecified: Secondary | ICD-10-CM | POA: Diagnosis not present

## 2023-12-23 DIAGNOSIS — Z87440 Personal history of urinary (tract) infections: Secondary | ICD-10-CM

## 2023-12-23 DIAGNOSIS — I69398 Other sequelae of cerebral infarction: Principal | ICD-10-CM

## 2023-12-23 DIAGNOSIS — H548 Legal blindness, as defined in USA: Secondary | ICD-10-CM | POA: Diagnosis present

## 2023-12-23 DIAGNOSIS — K811 Chronic cholecystitis: Secondary | ICD-10-CM | POA: Diagnosis present

## 2023-12-23 DIAGNOSIS — Z818 Family history of other mental and behavioral disorders: Secondary | ICD-10-CM

## 2023-12-23 DIAGNOSIS — R197 Diarrhea, unspecified: Secondary | ICD-10-CM | POA: Diagnosis present

## 2023-12-23 LAB — CBC
HCT: 27.8 % — ABNORMAL LOW (ref 39.0–52.0)
HCT: 29.7 % — ABNORMAL LOW (ref 39.0–52.0)
Hemoglobin: 9.1 g/dL — ABNORMAL LOW (ref 13.0–17.0)
Hemoglobin: 9.9 g/dL — ABNORMAL LOW (ref 13.0–17.0)
MCH: 30.3 pg (ref 26.0–34.0)
MCH: 31 pg (ref 26.0–34.0)
MCHC: 32.7 g/dL (ref 30.0–36.0)
MCHC: 33.3 g/dL (ref 30.0–36.0)
MCV: 92.7 fL (ref 80.0–100.0)
MCV: 93.1 fL (ref 80.0–100.0)
Platelets: 148 10*3/uL — ABNORMAL LOW (ref 150–400)
Platelets: 158 10*3/uL (ref 150–400)
RBC: 3 MIL/uL — ABNORMAL LOW (ref 4.22–5.81)
RBC: 3.19 MIL/uL — ABNORMAL LOW (ref 4.22–5.81)
RDW: 11.5 % (ref 11.5–15.5)
RDW: 11.6 % (ref 11.5–15.5)
WBC: 7.9 10*3/uL (ref 4.0–10.5)
WBC: 8 10*3/uL (ref 4.0–10.5)
nRBC: 0 % (ref 0.0–0.2)
nRBC: 0 % (ref 0.0–0.2)

## 2023-12-23 LAB — COMPREHENSIVE METABOLIC PANEL
ALT: 46 U/L — ABNORMAL HIGH (ref 0–44)
AST: 38 U/L (ref 15–41)
Albumin: 2.9 g/dL — ABNORMAL LOW (ref 3.5–5.0)
Alkaline Phosphatase: 79 U/L (ref 38–126)
Anion gap: 8 (ref 5–15)
BUN: 34 mg/dL — ABNORMAL HIGH (ref 8–23)
CO2: 26 mmol/L (ref 22–32)
Calcium: 8.5 mg/dL — ABNORMAL LOW (ref 8.9–10.3)
Chloride: 101 mmol/L (ref 98–111)
Creatinine, Ser: 1.87 mg/dL — ABNORMAL HIGH (ref 0.61–1.24)
GFR, Estimated: 37 mL/min — ABNORMAL LOW (ref >60)
Glucose, Bld: 130 mg/dL — ABNORMAL HIGH (ref 70–99)
Potassium: 3.7 mmol/L (ref 3.5–5.1)
Sodium: 135 mmol/L (ref 135–145)
Total Bilirubin: 0.5 mg/dL (ref 0.0–1.2)
Total Protein: 5.5 g/dL — ABNORMAL LOW (ref 6.5–8.1)

## 2023-12-23 LAB — CREATININE, SERUM
Creatinine, Ser: 2.31 mg/dL — ABNORMAL HIGH (ref 0.61–1.24)
GFR, Estimated: 29 mL/min — ABNORMAL LOW (ref >60)

## 2023-12-23 LAB — GLUCOSE, CAPILLARY
Glucose-Capillary: 125 mg/dL — ABNORMAL HIGH (ref 70–99)
Glucose-Capillary: 175 mg/dL — ABNORMAL HIGH (ref 70–99)
Glucose-Capillary: 220 mg/dL — ABNORMAL HIGH (ref 70–99)
Glucose-Capillary: 187 mg/dL — ABNORMAL HIGH (ref 70–99)

## 2023-12-23 LAB — MAGNESIUM: Magnesium: 1.6 mg/dL — ABNORMAL LOW (ref 1.7–2.4)

## 2023-12-23 MED ORDER — INSULIN GLARGINE 100 UNIT/ML ~~LOC~~ SOLN
35.0000 [IU] | Freq: Every day | SUBCUTANEOUS | Status: DC
  Administered 2023-12-24 – 2023-12-25 (×2): 35 [IU] via SUBCUTANEOUS
  Filled 2023-12-23 (×2): qty 0.35

## 2023-12-23 MED ORDER — TIZANIDINE HCL 4 MG PO TABS: 2.0000 mg | ORAL_TABLET | Freq: Two times a day (BID) | ORAL | Status: DC | PRN

## 2023-12-23 MED ORDER — HEPARIN SODIUM (PORCINE) 5000 UNIT/ML IJ SOLN
5000.0000 [IU] | Freq: Three times a day (TID) | INTRAMUSCULAR | Status: DC
  Administered 2023-12-23 – 2024-01-07 (×39): 5000 [IU] via SUBCUTANEOUS
  Filled 2023-12-23 (×39): qty 1

## 2023-12-23 MED ORDER — HEPARIN SODIUM (PORCINE) 5000 UNIT/ML IJ SOLN: 5000.0000 [IU] | Freq: Three times a day (TID) | INTRAMUSCULAR | Status: DC

## 2023-12-23 MED ORDER — DEXAMETHASONE 4 MG PO TABS: ORAL_TABLET | ORAL | 0 refills | Status: DC

## 2023-12-23 MED ORDER — LOPERAMIDE HCL 2 MG PO CAPS: 2.0000 mg | ORAL_CAPSULE | ORAL | 0 refills | Status: AC | PRN

## 2023-12-23 MED ORDER — FLUOXETINE HCL 20 MG PO CAPS
20.0000 mg | ORAL_CAPSULE | Freq: Every day | ORAL | Status: DC
  Administered 2023-12-24 – 2024-01-15 (×22): 20 mg via ORAL
  Filled 2023-12-23 (×23): qty 1

## 2023-12-23 MED ORDER — ALPRAZOLAM 0.25 MG PO TABS
0.2500 mg | ORAL_TABLET | Freq: Three times a day (TID) | ORAL | Status: DC | PRN
  Administered 2023-12-25 – 2024-01-14 (×8): 0.25 mg via ORAL
  Filled 2023-12-23 (×9): qty 1

## 2023-12-23 MED ORDER — LATANOPROST 0.005 % OP SOLN
1.0000 [drp] | Freq: Every day | OPHTHALMIC | Status: DC
  Administered 2023-12-23 – 2024-01-14 (×23): 1 [drp] via OPHTHALMIC
  Filled 2023-12-23: qty 2.5

## 2023-12-23 MED ORDER — SENNOSIDES-DOCUSATE SODIUM 8.6-50 MG PO TABS: 1.0000 | ORAL_TABLET | Freq: Every evening | ORAL | Status: DC | PRN

## 2023-12-23 MED ORDER — ACETAMINOPHEN 650 MG RE SUPP: 650.0000 mg | RECTAL | Status: DC | PRN

## 2023-12-23 MED ORDER — ACETAMINOPHEN 160 MG/5ML PO SOLN: 650.0000 mg | ORAL | Status: DC | PRN

## 2023-12-23 MED ORDER — LOPERAMIDE HCL 2 MG PO CAPS
2.0000 mg | ORAL_CAPSULE | ORAL | Status: DC | PRN
  Administered 2023-12-28 – 2024-01-12 (×6): 2 mg via ORAL
  Filled 2023-12-23 (×6): qty 1

## 2023-12-23 MED ORDER — DORZOLAMIDE HCL 2 % OP SOLN
1.0000 [drp] | Freq: Two times a day (BID) | OPHTHALMIC | Status: DC
  Administered 2023-12-23 – 2024-01-15 (×45): 1 [drp] via OPHTHALMIC
  Filled 2023-12-23 (×2): qty 10

## 2023-12-23 MED ORDER — TIMOLOL MALEATE 0.5 % OP SOLN
1.0000 [drp] | Freq: Two times a day (BID) | OPHTHALMIC | Status: DC
  Administered 2023-12-23 – 2024-01-15 (×45): 1 [drp] via OPHTHALMIC
  Filled 2023-12-23 (×2): qty 5

## 2023-12-23 MED ORDER — NEPRO/CARBSTEADY PO LIQD
237.0000 mL | Freq: Two times a day (BID) | ORAL | Status: DC
  Administered 2023-12-23: 237 mL via ORAL

## 2023-12-23 MED ORDER — HEPARIN SODIUM (PORCINE) 1000 UNIT/ML DIALYSIS
1000.0000 [IU] | INTRAMUSCULAR | Status: DC | PRN
  Administered 2023-12-24: 1000 [IU]
  Filled 2023-12-23: qty 1

## 2023-12-23 MED ORDER — DEXAMETHASONE 4 MG PO TABS
4.0000 mg | ORAL_TABLET | Freq: Two times a day (BID) | ORAL | Status: DC
  Administered 2023-12-23: 4 mg via ORAL
  Filled 2023-12-23: qty 1

## 2023-12-23 MED ORDER — ATORVASTATIN CALCIUM 40 MG PO TABS
40.0000 mg | ORAL_TABLET | Freq: Every day | ORAL | Status: DC
  Administered 2023-12-23 – 2024-01-14 (×23): 40 mg via ORAL
  Filled 2023-12-23 (×23): qty 1

## 2023-12-23 MED ORDER — TAMSULOSIN HCL 0.4 MG PO CAPS
0.4000 mg | ORAL_CAPSULE | Freq: Every day | ORAL | Status: DC
  Administered 2023-12-23 – 2024-01-07 (×14): 0.4 mg via ORAL
  Filled 2023-12-23 (×14): qty 1

## 2023-12-23 MED ORDER — CYCLOSPORINE 0.05 % OP EMUL
1.0000 [drp] | Freq: Two times a day (BID) | OPHTHALMIC | Status: DC
Start: 2023-12-23 — End: 2024-01-15
  Administered 2023-12-23 – 2024-01-15 (×43): 1 [drp] via OPHTHALMIC
  Filled 2023-12-23 (×49): qty 30

## 2023-12-23 MED ORDER — NEPRO/CARBSTEADY PO LIQD: 237.0000 mL | Freq: Two times a day (BID) | ORAL | 0 refills | Status: DC

## 2023-12-23 MED ORDER — RENA-VITE PO TABS
1.0000 | ORAL_TABLET | Freq: Every day | ORAL | Status: DC
  Administered 2023-12-23 – 2024-01-14 (×23): 1 via ORAL
  Filled 2023-12-23 (×23): qty 1

## 2023-12-23 MED ORDER — DORZOLAMIDE HCL-TIMOLOL MAL 2-0.5 % OP SOLN: 1.0000 [drp] | Freq: Two times a day (BID) | OPHTHALMIC | Status: DC

## 2023-12-23 MED ORDER — CHLORHEXIDINE GLUCONATE CLOTH 2 % EX PADS
6.0000 | MEDICATED_PAD | Freq: Two times a day (BID) | CUTANEOUS | Status: DC
  Administered 2023-12-23 – 2023-12-29 (×12): 6 via TOPICAL

## 2023-12-23 MED ORDER — INSULIN ASPART 100 UNIT/ML IJ SOLN
0.0000 [IU] | Freq: Three times a day (TID) | INTRAMUSCULAR | Status: DC
  Administered 2023-12-23: 7 [IU] via SUBCUTANEOUS
  Administered 2023-12-24: 3 [IU] via SUBCUTANEOUS
  Administered 2023-12-24: 7 [IU] via SUBCUTANEOUS
  Administered 2023-12-25 (×2): 3 [IU] via SUBCUTANEOUS
  Administered 2023-12-26 (×2): 4 [IU] via SUBCUTANEOUS
  Administered 2023-12-27: 15 [IU] via SUBCUTANEOUS
  Administered 2023-12-27: 3 [IU] via SUBCUTANEOUS
  Administered 2023-12-28 – 2023-12-29 (×2): 4 [IU] via SUBCUTANEOUS

## 2023-12-23 MED ORDER — ALPRAZOLAM 0.25 MG PO TABS: 0.2500 mg | ORAL_TABLET | Freq: Three times a day (TID) | ORAL | 0 refills | Status: DC | PRN

## 2023-12-23 MED ORDER — ACETAMINOPHEN 325 MG PO TABS
650.0000 mg | ORAL_TABLET | ORAL | Status: DC | PRN
  Administered 2024-01-07 – 2024-01-13 (×4): 650 mg via ORAL
  Filled 2023-12-23 (×7): qty 2

## 2023-12-23 MED ORDER — ASPIRIN 81 MG PO CHEW
81.0000 mg | CHEWABLE_TABLET | Freq: Every day | ORAL | Status: DC
  Administered 2023-12-24 – 2024-01-15 (×22): 81 mg via ORAL
  Filled 2023-12-23 (×24): qty 1

## 2023-12-23 NOTE — Discharge Summary (Signed)
 Physician Discharge Summary  Philip Benjamin ZOX:[id number] DOB: 1951-05-29 DOA: 12/18/2023  PCP: Kirstie Peri, MD  Admit date: 12/18/2023  Discharge date: 12/23/2023  Admitted From:Home  Disposition:  CIR  Recommendations for Outpatient Follow-up:  Follow up with PCP in 1-2 weeks Follow-up with neurology outpatient at [facility] in 3 months Follow-up with neurosurgery Dr. Wynetta Emery regarding cervical spinal stenosis and cord compression. Continue on dexamethasone taper as prescribed Use Imodium as needed Continue hemodialysis with next session 3/5 Continue other home medications as noted below with statin and aspirin  Home Health: None  Equipment/Devices: None  Discharge Condition:Stable  CODE STATUS: Full  Diet recommendation: Heart Healthy/carb modified-1500 mL fluid restriction  Brief/Interim Summary: Philip Benjamin is a 73-year-old male with extensive history of DM2, HTN, HLD,ESRD on HD presenting with chief complaint of weakness.  Patient reports right-sided weakness in the past 48 hours, right upper and lower extremity.  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  He is awaiting placement to CIR.   Discharge Diagnoses:  Principal Problem:   Acute stroke due to ischemia [facility]) Active Problems:   T2DM (type 2 diabetes mellitus) (HCC)   BPH (benign prostatic hyperplasia)   ESRD (end stage renal disease) on dialysis [facility])   Essential hypertension   Mixed hyperlipidemia   Glaucoma   Generalized weakness   Anemia in chronic kidney disease  Principal discharge diagnosis: Acute ischemic CVA.  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  Discharge Instructions  Discharge Instructions     Ambulatory referral to Neurology   Complete by: As directed    An appointment is requested in  approximately: 8-12 weeks   Ambulatory referral to Neurosurgery   Complete by: As directed    Diet - low sodium heart healthy   Complete by: As directed    Increase activity slowly   Complete by: As directed       Allergies as of 12/23/2023   No Known Allergies      Medication List     TAKE these medications    acetaminophen 650 MG CR tablet Commonly known as: TYLENOL Take 650 mg by mouth every 6 (six) hours as needed for pain.   ALPRAZolam 0.25 MG tablet Commonly known as: XANAX Take 1 tablet (0.25 mg total) by mouth 3 (three) times daily as needed for anxiety.   atorvastatin 20 MG tablet Commonly known as: LIPITOR Take 1 tablet by mouth daily.   Cholecalciferol 125 MCG (5000 UT) capsule Take by mouth.   cycloSPORINE 0.05 % ophthalmic emulsion Commonly known as: RESTASIS SMARTSIG:In Eye(s)   dexamethasone 4 MG tablet Commonly known as: DECADRON Take 1 tablet (4 mg total) by mouth every 12 (twelve) hours for 5 days, THEN 1 tablet (4 mg total) daily for 5 days. Start taking on: December 23, 2023   Dorzolamide HCl-Timolol Mal PF 2-0.5 % Soln Apply 1 drop to eye 2 (two) times daily.   feeding supplement (NEPRO CARB STEADY) Liqd Take 237 mLs by mouth 2 (two) times daily between meals.   Fluoxetine HCl (PMDD) 20 MG Tabs Take 1 tablet by mouth daily.   folic acid 800 MCG tablet Commonly known as: FOLVITE Take 800 mcg by mouth daily.   Lantus 100 UNIT/ML injection Generic drug: insulin glargine Inject 26 Units into the skin daily.   latanoprost 0.005 %  ophthalmic solution Commonly known as: XALATAN Place 1 drop into both eyes at bedtime. What changed: how to take this   loperamide 2 MG capsule Commonly known as: IMODIUM Take 1 capsule (2 mg total) by mouth as needed for diarrhea or loose stools.   multivitamin Tabs tablet Take 1 tablet by mouth at bedtime.   nystatin cream Commonly known as: MYCOSTATIN Apply 1 application topically 2 (two) times daily as  needed. FOR YEAST OR SKIN RASHES.   ondansetron 4 MG tablet Commonly known as: ZOFRAN Take by mouth 2 (two) times daily.   tamsulosin 0.4 MG Caps capsule Commonly known as: FLOMAX Take 0.4 mg by mouth daily with supper.        Follow-up Information     Kirstie Peri, MD. Schedule an appointment as soon as possible for a visit in 2 week(s).   Specialty: Internal Medicine Contact information: [address] [place] [address] [phone]         [facility]. Go to.   Contact information: [address]     [address] [place] [address] [phone]        Donalee Citrin, MD. Go to.   Specialty: Neurosurgery Contact information: [address] [place] [address] [phone]                No Known Allergies  Consultations: None   Procedures/Studies: ECHOCARDIOGRAM COMPLETE Result Date: 12/19/2023    ECHOCARDIOGRAM REPORT   Patient Name:   Philip Benjamin Date of Exam: 12/19/2023 Medical Rec #:  [id number]        Height:       73.0 in Accession #:    [id number]       Weight:       190.0 lb Date of Birth:  05-Mar-1951       BSA:          2.105 m Patient Age:    72 years         BP:           132/66 mmHg Patient Gender: M                HR:           62 bpm. [name] Procedure: 2D Echo, Cardiac Doppler, Color Doppler and Intracardiac            Opacification Agent (Both Spectral and Color Flow Doppler were            utilized during procedure). Indications:    Stroke I63.9  History:        Patient has no prior history of Echocardiogram examinations.                 Risk Factors:Hypertension, Diabetes and Dyslipidemia. ESRD (end                 stage renal disease) on dialysis [facility]),                 Acute stroke due to ischemia [facility]).  Sonographer:    Celesta Gentile RCS Referring Phys: [id number] SEYED A SHAHMEHDI IMPRESSIONS  1. Left ventricular ejection fraction, by estimation, is 60 to 65%. The left  ventricle has normal function. The left ventricle has no regional wall motion abnormalities. There is mild left ventricular hypertrophy. Left ventricular diastolic parameters were normal.  2. Right ventricular systolic function is normal. The right ventricular size is normal. Tricuspid regurgitation signal  is inadequate for assessing PA pressure.  3. Left atrial size was moderately dilated.  4. The mitral valve is normal in structure. Trivial mitral valve regurgitation. No evidence of mitral stenosis.  5. The aortic valve is tricuspid. Aortic valve regurgitation is not visualized. No aortic stenosis is present.  6. The inferior vena cava is normal in size with greater than 50% respiratory variability, suggesting right atrial pressure of 3 mmHg. FINDINGS  Left Ventricle: Left ventricular ejection fraction, by estimation, is 60 to 65%. The left ventricle has normal function. The left ventricle has no regional wall motion abnormalities. Definity contrast agent was given IV to delineate the left ventricular  endocardial borders. Strain imaging was not performed. The left ventricular internal cavity size was normal in size. There is mild left ventricular hypertrophy. Left ventricular diastolic parameters were normal. Right Ventricle: The right ventricular size is normal. Right vetricular wall thickness was not well visualized. Right ventricular systolic function is normal. Tricuspid regurgitation signal is inadequate for assessing PA pressure. Left Atrium: Left atrial size was moderately dilated. Right Atrium: Right atrial size was normal in size. Pericardium: There is no evidence of pericardial effusion. Mitral Valve: The mitral valve is normal in structure. Trivial mitral valve regurgitation. No evidence of mitral valve stenosis. Tricuspid Valve: The tricuspid valve is normal in structure. Tricuspid valve regurgitation is not demonstrated. No evidence of tricuspid stenosis. Aortic Valve: The aortic valve is tricuspid.  Aortic valve regurgitation is not visualized. No aortic stenosis is present. Aortic valve mean gradient measures 6.0 mmHg. Aortic valve peak gradient measures 10.1 mmHg. Aortic valve area, by VTI measures 3.29  cm. Pulmonic Valve: The pulmonic valve was not well visualized. Pulmonic valve regurgitation is not visualized. No evidence of pulmonic stenosis. Aorta: The aortic root is normal in size and structure. Venous: The inferior vena cava is normal in size with greater than 50% respiratory variability, suggesting right atrial pressure of 3 mmHg. IAS/Shunts: No atrial level shunt detected by color flow Doppler. Additional Comments: 3D imaging was not performed.  LEFT VENTRICLE PLAX 2D LVIDd:         4.50 cm   Diastology LVIDs:         2.60 cm   LV e' medial:    7.07 cm/s LV PW:         1.20 cm   LV E/e' medial:  13.7 LV IVS:        1.10 cm   LV e' lateral:   10.00 cm/s LVOT diam:     2.20 cm   LV E/e' lateral: 9.7 LV SV:         141 LV SV Index:   67 LVOT Area:     3.80 cm  RIGHT VENTRICLE RV S prime:     13.30 cm/s TAPSE (M-mode): 1.9 cm LEFT ATRIUM             Index        RIGHT ATRIUM           Index LA diam:        3.50 cm 1.66 cm/m   RA Area:     17.70 cm LA Vol (A2C):   83.6 ml 39.71 ml/m  RA Volume:   46.30 ml  21.99 ml/m LA Vol (A4C):   93.1 ml 44.22 ml/m LA Biplane Vol: 92.3 ml 43.84 ml/m  AORTIC VALVE AV Area (Vmax):    3.56 cm AV Area (Vmean):   3.16 cm AV Area (VTI):  3.29 cm AV Vmax:           159.00 cm/s AV Vmean:          112.000 cm/s AV VTI:            0.430 m AV Peak Grad:      10.1 mmHg AV Mean Grad:      6.0 mmHg LVOT Vmax:         149.00 cm/s LVOT Vmean:        93.000 cm/s LVOT VTI:          0.372 m LVOT/AV VTI ratio: 0.87  AORTA Ao Root diam: 3.40 cm MITRAL VALVE MV Area (PHT): 3.31 cm    SHUNTS MV Decel Time: 229 msec    Systemic VTI:  0.37 m MV E velocity: 96.80 cm/s  Systemic Diam: 2.20 cm MV A velocity: 90.80 cm/s MV E/A ratio:  1.07 Dina Rich MD Electronically signed by  Dina Rich MD Signature Date/Time: 12/19/2023/1:28:40 PM    Final    US Carotid Bilateral (at [facility] and [facility] only) Result Date: 12/18/2023 CLINICAL DATA:  73 year old male with history of stroke. EXAM: BILATERAL CAROTID DUPLEX ULTRASOUND TECHNIQUE: Wallace Cullens scale imaging, color Doppler and duplex ultrasound were performed of bilateral carotid and vertebral arteries in the neck. COMPARISON:  None Available. FINDINGS: Criteria: Quantification of carotid stenosis is based on velocity parameters that correlate the residual internal carotid diameter with NASCET-based stenosis levels, using the diameter of the distal internal carotid lumen as the denominator for stenosis measurement. The following velocity measurements were obtained: RIGHT ICA: Peak systolic velocity 76 cm/sec, End diastolic velocity 21 cm/sec CCA: Peak systolic velocity 102 cm/sec SYSTOLIC ICA/CCA RATIO:  0.7 ECA: Peak systolic velocity 88 cm/sec LEFT ICA: Peak systolic velocity 86 cm/sec, End diastolic velocity 23 cm/sec CCA: 113 cm/sec SYSTOLIC ICA/CCA RATIO:  0.8 ECA: 84 cm/sec RIGHT CAROTID ARTERY: Minimal atherosclerotic plaque formation about the bulb and bifurcation. No significant tortuosity. Normal low resistance waveforms. RIGHT VERTEBRAL ARTERY:  Antegrade flow. LEFT CAROTID ARTERY: Minimal atherosclerotic plaque formation about the bulb and bifurcation. No significant tortuosity. Normal low resistance waveforms. LEFT VERTEBRAL ARTERY:  Antegrade flow. Upper extremity non-invasive blood pressures: Not obtained. IMPRESSION: 1. Right carotid artery system: Patent without significant atherosclerotic plaque formation. 2. Left carotid artery system: Patent without significant atherosclerotic plaque formation. 3.  Vertebral artery system: Patent with antegrade flow bilaterally. Marliss Coots, MD [facility] Electronically Signed   By: Marliss Coots M.D.   On: 12/18/2023 16:16   MR CERVICAL  SPINE WO CONTRAST Result Date: 12/18/2023 CLINICAL DATA:  Cervical spinal stenosis. EXAM: MRI CERVICAL SPINE WITHOUT CONTRAST TECHNIQUE: Multiplanar, multisequence MR imaging of the cervical spine was performed. No intravenous contrast was administered. COMPARISON:  None Available. FINDINGS: Alignment: Cervical lordosis is maintained. There is mild dextrocurvature of the cervical spine. No significant listhesis. Vertebrae: There is partial fusion of the C2 and C3 vertebral bodies. Associated signal abnormality at this level likely reflecting Modic type 2 degenerative endplate changes. Additional Modic type 2 degenerative endplate changes at C6-7. No bone marrow edema in the vertebral bodies or evidence of acute fracture. Cord: Cord compression noted at the C3-4 level with subtle increased STIR signal which may reflect myelomalacia versus edema. Cord signal otherwise unremarkable. Posterior Fossa, vertebral arteries, paraspinal tissues: The visualized posterior fossa structures are unremarkable. There is prevertebral edema from the C3-4 level to the upper thoracic spine possibly related to prominent endplate osteophytes. No findings to  suggest anterior longitudinal ligament disruption. There is additional edema within the paraspinal musculature of the mid and lower cervical spine more pronounced on the right. Edema involves the interspinous ligaments from C3-4 to C7-T1 suggesting ligament strain. 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 C2-3: Fusion at this level. No significant spinal canal stenosis. Bilateral facet arthrosis. Mild bilateral foraminal stenosis. C3-4: Disc osteophyte complex which abuts the ventral cervical cord. Thickening of the ligamentum flavum abuts the dorsal cord. Severe spinal canal stenosis and cord compression at this level. Uncovertebral hypertrophy and facet arthrosis. Moderate to severe bilateral foraminal stenosis.  C4-5: Mild disc height loss. Disc osteophyte complex slightly eccentric to the left contacts the ventral cervical cord. No cord signal abnormality. Thickening of the ligamentum flavum. Moderate spinal canal stenosis. Bilateral facet arthrosis and uncovertebral hypertrophy. Moderate foraminal narrowing on the right. Severe foraminal narrowing on the left with some contribution from the disc osteophyte complex. C5-6: Disc height loss. Disc osteophyte complex abuts the ventral cord. Thickening of the ligamentum flavum abuts the dorsal cord on the right. Moderate spinal canal stenosis. Subtle cord compression without cord signal abnormality. Bilateral facet arthrosis and uncovertebral hypertrophy. Moderate to severe right and severe left foraminal stenosis. C6-7: Disc height loss. Disc osteophyte complex abuts the ventral cord. Thickening of the ligamentum flavum abuts the dorsal cord. Moderate spinal canal stenosis. Bilateral uncovertebral hypertrophy and facet arthrosis resulting in moderate to severe bilateral foraminal stenosis. C7-T1: Disc bulge which indents the ventral thecal sac without contacting the spinal cord. Thickening of the ligamentum flavum abuts the dorsal thecal sac more pronounced on the left. Mild-to-moderate spinal canal stenosis. Bilateral facet arthrosis. Mild-to-moderate right and moderate left foraminal stenosis. IMPRESSION: 1. 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 Findings are likely chronic and degenerative in etiology. No findings to suggest acute disc herniation. 2. 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 Moderate spinal canal stenosis at C4-5 through C6-7. 3. Multilevel foraminal stenosis, greatest and severe on the left at C4-5 and C5-6. Additional moderate to severe foraminal stenosis at multiple levels. 4. Prevertebral edema from C3-4 to the upper thoracic spine possibly  related to prominent endplate osteophytes. No evidence of anterior longitudinal ligament disruption. 5. Edema of interspinous ligaments at multiple levels suggesting ligament strain. Electronically Signed   By: Emily Filbert M.D.   On: 12/18/2023 15:26   MR ANGIO HEAD WO CONTRAST Result Date: 12/18/2023 CLINICAL DATA:  Stroke follow-up EXAM: MRA HEAD WITHOUT CONTRAST TECHNIQUE: Angiographic images of the Circle of Willis were acquired using MRA technique without intravenous contrast. COMPARISON:  Same day MRI head.  CT head 05/20/2023. FINDINGS: Anterior circulation: The bilateral intracranial ICAs are patent. There is mild narrowing of the bilateral supraclinoid ICAs slightly greater on the right, likely related to atherosclerosis. The right M1 segment and MCA bifurcation are patent. Distal right MCA branches are patent. The left M1 segment and left MCA bifurcation are patent. Distal left MCA branches are patent. A1 A2 segments are patent bilaterally. Distal ACA branches are patent. Posterior circulation: The visualized intracranial vertebral arteries are patent. Basilar artery is patent. Small bilateral posterior communicating arteries noted. The bilateral PCAs are patent. SCAs and AICAs are visualized bilaterally. Anatomic variants: None Other: None. IMPRESSION: 1. No evidence of intracranial large vessel occlusion or high-grade stenosis. 2. Mild narrowing of the bilateral supraclinoid ICAs slightly greater on the right, likely related to atherosclerosis. Electronically Signed  By: Emily Filbert M.D.   On: 12/18/2023 15:11   DG Chest Port 1 View Result Date: 12/18/2023 CLINICAL DATA:  Weakness. EXAM: PORTABLE CHEST 1 VIEW COMPARISON:  09/29/2023. FINDINGS: Low lung volume. Bilateral lung fields are clear. No acute consolidation or lung collapse. No pulmonary edema. Bilateral costophrenic angles are clear. Stable cardio-mediastinal silhouette. No acute osseous abnormalities. The soft tissues are within  normal limits. Right IJ hemodialysis catheter noted with its tip overlying the cavoatrial junction region. IMPRESSION: No active disease. Electronically Signed   By: Jules Schick M.D.   On: 12/18/2023 11:48   MR BRAIN WO CONTRAST Result Date: 12/18/2023 CLINICAL DATA:  Provided history: Neuro deficit, acute, stroke suspected. EXAM: MRI HEAD WITHOUT CONTRAST TECHNIQUE: Multiplanar, multiecho pulse sequences of the brain and surrounding structures were obtained without intravenous contrast. COMPARISON:  Head CT 05/20/2023. FINDINGS: Brain: Mild-to-moderate generalized cerebral atrophy. Commensurate prominence of the ventricles and sulci. 3 mm acute infarct within the left lentiform nucleus (series 5, image 26). Chronic lacunar infarcts within the bilateral cerebral hemispheric white matter, within the left thalamus and within the pons. Multifocal T2 FLAIR hyperintense signal abnormality elsewhere within the cerebral white matter (advanced) and pons (moderate), nonspecific but also compatible with chronic small vessel ischemic disease. Several nonspecific chronic microhemorrhages scattered within the supratentorial and infratentorial brain. Small chronic infarct within the left cerebellar hemisphere. No cortical encephalomalacia is identified. No evidence of an intracranial mass. No extra-axial fluid collection. No midline shift. Vascular: Maintained flow voids within the proximal large arterial vessels. Skull and upper cervical spine: No focal worrisome marrow lesion. Sinuses/Orbits: No mass or acute finding within the imaged orbits. Prior bilateral ocular lens replacement. Trace mucosal thickening within the left maxillary sinus. Other: Small-volume fluid within bilateral mastoid air cells. Asymmetric T2 hyperintense signal within the right petrous apex, potentially reflecting trapped fluid. IMPRESSION: 1. 3 mm acute infarct within the left lentiform nucleus. 2. Background parenchymal atrophy, advanced chronic  small vessel ischemic disease and chronic infarcts, as detailed. 3. Several nonspecific chronic microhemorrhages scattered within the supratentorial and infratentorial brain. 4. Small-volume fluid within bilateral mastoid air cells. 5. Asymmetric T2 hyperintense signal within the right petrous apex, potentially reflecting trapped fluid. Electronically Signed   By: Jackey Loge D.O.   On: 12/18/2023 10:28     Discharge Exam: Vitals:   12/23/23 0800 12/23/23 0900  BP:    Pulse:    Resp: 11 19  Temp:    SpO2:     Vitals:   12/23/23 0600 12/23/23 0700 12/23/23 0800 12/23/23 0900  BP:      Pulse:      Resp: 11 11 11 19   Temp:      TempSrc:      SpO2:      Weight:      Height:        General: Pt is alert, awake, not in acute distress Cardiovascular: RRR, S1/S2 +, no rubs, no gallops Respiratory: CTA bilaterally, no wheezing, no rhonchi Abdominal: Soft, NT, ND, bowel sounds + Extremities: no edema, no cyanosis    The results of significant diagnostics from this hospitalization (including imaging, microbiology, ancillary and laboratory) are listed below for reference.     Microbiology: Recent Results (from the past 240 hours)  Resp panel by RT-PCR (RSV, Flu A&B, Covid) Anterior Nasal Swab     Status: None   Collection Time: 12/18/23  9:05 AM   Specimen: Anterior Nasal Swab  Result Value Ref Range Status   SARS  Coronavirus 2 by RT PCR NEGATIVE NEGATIVE Final    Comment: (NOTE) SARS-CoV-2 target nucleic acids are NOT DETECTED.  The SARS-CoV-2 RNA is generally detectable in upper respiratory specimens during the acute phase of infection. The lowest concentration of SARS-CoV-2 viral copies this assay can detect is 138 copies/mL. A negative result does not preclude SARS-Cov-2 infection and should not be used as the sole basis for treatment or other patient management decisions. A negative result may occur with  improper specimen collection/handling, submission of specimen  other than nasopharyngeal swab, presence of viral mutation(s) within the areas targeted by this assay, and inadequate number of viral copies(<138 copies/mL). A negative result must be combined with clinical observations, patient history, and epidemiological information. The expected result is Negative.  Fact Sheet for Patients:  BloggerCourse.com  Fact Sheet for Healthcare Providers:  SeriousBroker.it  This test is no t yet approved or cleared by the [place] FDA and  has been authorized for detection and/or diagnosis of SARS-CoV-2 by FDA under an Emergency Use Authorization (EUA). This EUA will remain  in effect (meaning this test can be used) for the duration of the COVID-19 declaration under Section 564(b)(1) of the Act, 21 U.S.C.section 360bbb-3(b)(1), unless the authorization is terminated  or revoked sooner.       Influenza A by PCR NEGATIVE NEGATIVE Final   Influenza B by PCR NEGATIVE NEGATIVE Final    Comment: (NOTE) The Xpert Xpress SARS-CoV-2/FLU/RSV plus assay is intended as an aid in the diagnosis of influenza from Nasopharyngeal swab specimens and should not be used as a sole basis for treatment. Nasal washings and aspirates are unacceptable for Xpert Xpress SARS-CoV-2/FLU/RSV testing.  Fact Sheet for Patients: BloggerCourse.com  Fact Sheet for Healthcare Providers: SeriousBroker.it  This test is not yet approved or cleared by the [place] FDA and has been authorized for detection and/or diagnosis of SARS-CoV-2 by FDA under an Emergency Use Authorization (EUA). This EUA will remain in effect (meaning this test can be used) for the duration of the COVID-19 declaration under Section 564(b)(1) of the Act, 21 U.S.C. section 360bbb-3(b)(1), unless the authorization is terminated or revoked.     Resp Syncytial Virus by PCR NEGATIVE NEGATIVE Final     Comment: (NOTE) Fact Sheet for Patients: BloggerCourse.com  Fact Sheet for Healthcare Providers: SeriousBroker.it  This test is not yet approved or cleared by the [place] FDA and has been authorized for detection and/or diagnosis of SARS-CoV-2 by FDA under an Emergency Use Authorization (EUA). This EUA will remain in effect (meaning this test can be used) for the duration of the COVID-19 declaration under Section 564(b)(1) of the Act, 21 U.S.C. section 360bbb-3(b)(1), unless the authorization is terminated or revoked.  Performed at [facility], [address], [place], [place] [address]      Labs: BNP (last 3 results) Recent Labs    08/14/23 0515 08/21/23 1826  BNP 45.0 91.0   Basic Metabolic Panel: Recent Labs  Lab 12/18/23 0922 12/20/23 0930 12/21/23 1331 12/22/23 1118 12/22/23 1146 12/23/23 0348  NA 137 133* 129* 130*  --  135  K 3.5 3.7 4.1 3.9  --  3.7  CL 98 97* 95* 97*  --  101  CO2 27 25 22 24   --  26  GLUCOSE 99 284* 349* 276*  --  130*  BUN 20 21 37* 50*  --  34*  CREATININE 3.02* 2.44* 2.74* 2.78*  --  1.87*  CALCIUM 9.3 8.7* 8.8* 8.6*  --  8.5*  MG  --   --  1.8  --  1.8 1.6*  PHOS  --  2.7  --   --   --   --    Liver Function Tests: Recent Labs  Lab 12/20/23 0930 12/22/23 1118 12/23/23 0348  AST  --  39 38  ALT  --  45* 46*  ALKPHOS  --  86 79  BILITOT  --  0.3 0.5  PROT  --  5.6* 5.5*  ALBUMIN 3.1* 2.9* 2.9*   No results for input(s): "LIPASE", "AMYLASE" in the last 168 hours. No results for input(s): "AMMONIA" in the last 168 hours. CBC: Recent Labs  Lab 12/18/23 0922 12/20/23 0930 12/22/23 1146 12/23/23 0348  WBC 5.6 7.0 8.5 7.9  HGB 10.0* 9.0* 8.9* 9.1*  HCT 31.8* 28.5* 27.4* 27.8*  MCV 96.4 95.0 93.2 92.7  PLT 129* 131* 148* 148*   Cardiac Enzymes: No results for input(s): "CKTOTAL", "CKMB", "CKMBINDEX", "TROPONINI" in the last 168 hours. BNP: Invalid input(s):  "POCBNP" CBG: Recent Labs  Lab 12/21/23 2023 12/22/23 0714 12/22/23 1620 12/22/23 2147 12/23/23 0736  GLUCAP 204* 199* 158* 153* 125*   D-Dimer No results for input(s): "DDIMER" in the last 72 hours. Hgb A1c No results for input(s): "HGBA1C" in the last 72 hours. Lipid Profile No results for input(s): "CHOL", "HDL", "LDLCALC", "TRIG", "CHOLHDL", "LDLDIRECT" in the last 72 hours. Thyroid function studies No results for input(s): "TSH", "T4TOTAL", "T3FREE", "THYROIDAB" in the last 72 hours.  Invalid input(s): "FREET3" Anemia work up No results for input(s): "VITAMINB12", "FOLATE", "FERRITIN", "TIBC", "IRON", "RETICCTPCT" in the last 72 hours. Urinalysis    Component Value Date/Time   COLORURINE AMBER (A) 09/29/2023 1524   APPEARANCEUR CLOUDY (A) 09/29/2023 1524   LABSPEC 1.017 09/29/2023 1524   PHURINE 5.0 09/29/2023 1524   GLUCOSEU NEGATIVE 09/29/2023 1524   HGBUR SMALL (A) 09/29/2023 1524   BILIRUBINUR NEGATIVE 09/29/2023 1524   KETONESUR NEGATIVE 09/29/2023 1524   PROTEINUR 100 (A) 09/29/2023 1524   NITRITE NEGATIVE 09/29/2023 1524   LEUKOCYTESUR MODERATE (A) 09/29/2023 1524   Sepsis Labs Recent Labs  Lab 12/18/23 0922 12/20/23 0930 12/22/23 1146 12/23/23 0348  WBC 5.6 7.0 8.5 7.9   Microbiology Recent Results (from the past 240 hours)  Resp panel by RT-PCR (RSV, Flu A&B, Covid) Anterior Nasal Swab     Status: None   Collection Time: 12/18/23  9:05 AM   Specimen: Anterior Nasal Swab  Result Value Ref Range Status   SARS Coronavirus 2 by RT PCR NEGATIVE NEGATIVE Final    Comment: (NOTE) SARS-CoV-2 target nucleic acids are NOT DETECTED.  The SARS-CoV-2 RNA is generally detectable in upper respiratory specimens during the acute phase of infection. The lowest concentration of SARS-CoV-2 viral copies this assay can detect is 138 copies/mL. A negative result does not preclude SARS-Cov-2 infection and should not be used as the sole basis for treatment or other  patient management decisions. A negative result may occur with  improper specimen collection/handling, submission of specimen other than nasopharyngeal swab, presence of viral mutation(s) within the areas targeted by this assay, and inadequate number of viral copies(<138 copies/mL). A negative result must be combined with clinical observations, patient history, and epidemiological information. The expected result is Negative.  Fact Sheet for Patients:  BloggerCourse.com  Fact Sheet for Healthcare Providers:  SeriousBroker.it  This test is no t yet approved or cleared by the [place] FDA and  has been authorized for detection and/or diagnosis of SARS-CoV-2  by FDA under an Emergency Use Authorization (EUA). This EUA will remain  in effect (meaning this test can be used) for the duration of the COVID-19 declaration under Section 564(b)(1) of the Act, 21 U.S.C.section 360bbb-3(b)(1), unless the authorization is terminated  or revoked sooner.       Influenza A by PCR NEGATIVE NEGATIVE Final   Influenza B by PCR NEGATIVE NEGATIVE Final    Comment: (NOTE) The Xpert Xpress SARS-CoV-2/FLU/RSV plus assay is intended as an aid in the diagnosis of influenza from Nasopharyngeal swab specimens and should not be used as a sole basis for treatment. Nasal washings and aspirates are unacceptable for Xpert Xpress SARS-CoV-2/FLU/RSV testing.  Fact Sheet for Patients: BloggerCourse.com  Fact Sheet for Healthcare Providers: SeriousBroker.it  This test is not yet approved or cleared by the [place] FDA and has been authorized for detection and/or diagnosis of SARS-CoV-2 by FDA under an Emergency Use Authorization (EUA). This EUA will remain in effect (meaning this test can be used) for the duration of the COVID-19 declaration under Section 564(b)(1) of the Act, 21 U.S.C. section  360bbb-3(b)(1), unless the authorization is terminated or revoked.     Resp Syncytial Virus by PCR NEGATIVE NEGATIVE Final    Comment: (NOTE) Fact Sheet for Patients: BloggerCourse.com  Fact Sheet for Healthcare Providers: SeriousBroker.it  This test is not yet approved or cleared by the [place] FDA and has been authorized for detection and/or diagnosis of SARS-CoV-2 by FDA under an Emergency Use Authorization (EUA). This EUA will remain in effect (meaning this test can be used) for the duration of the COVID-19 declaration under Section 564(b)(1) of the Act, 21 U.S.C. section 360bbb-3(b)(1), unless the authorization is terminated or revoked.  Performed at [facility], [address] [place], [place] [address]      Time coordinating discharge: 35 minutes  SIGNED:   Erick Blinks, DO [facility] 12/23/2023, 10:43 AM  If 7PM-7AM, please contact night-coverage [email or web address]

## 2023-12-23 NOTE — Progress Notes (Signed)
 Inpatient Rehabilitation Admission Medication Review by a Pharmacist  A complete drug regimen review was completed for this patient to identify any potential clinically significant medication issues.  High Risk Drug Classes Is patient taking? 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 PTA meds not currently ordered: MVI, folic acid. Per Nephro note: also calcitriol and Aranesp (last dose 2/17) >> getting at hemodialysis? Restart or discontinue as appropriate. Communicate medication changes with patient/family at discharge  Other       Clinically significant medication issues were identified that warrant physician communication and completion of prescribed/recommended actions by midnight of the next day:  No   Time spent performing this drug regimen review (minutes): 30  Thank you for allowing pharmacy to be a part of this patient's care.   Signe Colt, PharmD 12/23/2023 12:04 PM  **Pharmacist phone directory can be found on [email or web address] listed under [facility]**

## 2023-12-23 NOTE — Consult Note (Signed)
 [facility] Liaison Note  12/23/2023  [name] [birth year] [id number]  Location: RN Hospital Liaison screened the patient remotely at [facility].  Insurance: Medicare   Philip Benjamin is a 73 y.o. male who is a Primary Care Patient of Kirstie Peri, MD [facility]). The patient was screened for readmission hospitalization with noted extreme risk score for unplanned readmission risk with 4 IP/1 ED in 6 months.  The patient was assessed for potential Care Management service needs for post hospital transition for care coordination. Review of patient's electronic medical record reveals patient was admitted for Acute stroke due to ischemia. Pt will transition to CIR at [facility] facility today. This facility will continue to address pt's ongoing needs.  Plan: [facility] Liaison will continue to follow progress and disposition to asess for post hospital community care coordination/management needs.  Referral request for community care coordination: pending disposition.   [facility] Care Management/Population Health does not replace or interfere with any arrangements made by the Inpatient Transition of Care team.   For questions contact:   Elliot Cousin, RN, BSN Hospital Liaison [place]   [facility], Population Health Office Hours MTWF  8:00 am-6:00 pm Direct Dial: [phone] mobile [email or web address]

## 2023-12-23 NOTE — H&P (Signed)
 Physical Medicine and Rehabilitation Admission H&P        Chief Complaint  Patient presents with   Weakness  : HPI: Philip Benjamin is a 73 year old right-handed male with history significant for type 2 diabetes mellitus, anemia of chronic disease, BPH, hypertension, hyperlipidemia, end-stage renal disease with hemodialysis Monday Wednesday and Friday, glaucoma of the left eye/legally blind.  Per chart review patient lives with spouse and children.  Two-level home bed and bath main level and 5 steps to entry.  Household ambulator with rolling walker.  Daughter and brother can provide assistance on discharge.  Presented to [facility]/18/[facility]23 with right side weakness x 48 hours as well as diarrhea.  Patient with recent admission 09/29/2023 - 10/10/2023 secondary to general malaise, nausea and weakness with nonproductive cough and was discharged to skilled nursing facility ambulating min mod assist 30 feet.  At that time workup demonstrated positive COVID-19 with mild dehydration and hypokalemia.  Patient also with recent admission 08/21/2023 - 08/25/2023 treated for sepsis secondary to Pseudomonas UTI and lobar pneumonia.  He was treated with IV cefepime and discharged with Cipro.  During patient's latest admission noted acute cholecystitis concern for abscess CT of abdomen showed distended gallbladder with pericholecystic stranding underwent cholecystostomy tube placement with drainage of abscess 12/16 completing course of Zosyn and was discharged for short time to skilled nursing facility before returning home.  Upon present admission of 12/18/2023 due to right sided weakness MRI of the brain showed a 3 mm acute infarct within the left lentiform nucleus.  Background parenchymal atrophy, advanced chronic small vessel ischemic disease and chronic infarcts.  Several nonspecific chronic microhemorrhages scattered within the supratentorial and infratentorial brain.  Carotid ultrasound without  significant plaque formation.  MRA showed no evidence of intracranial large vessel occlusion or stenosis.  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  No findings to suggest acute disc herniation.  Multilevel foraminal stenosis greatest and severe on the left at C4-5 and C5-6.  Echocardiogram with ejection fraction of 60 to 65%.  The left ventricle showing no regional wall motion abnormalities.  Admission chemistries SARS coronavirus negative, BUN 20, creatinine 3.02, hemoglobin 10, hemoglobin A1c 5.6.  Neurology follow-up maintained on low-dose aspirin.  Subcutaneous heparin initiated for DVT prophylaxis.  Renal service follow-up with hemodialysis as directed.  Dr. Wynetta Emery of neurosurgery consulted in regards to findings of severe spinal canal stenosis cord compression C3-4 placed on trial of oral steroids and close follow-up with possible need for surgical intervention.  Patient is tolerating a regular consistency diet.  Therapy evaluations completed due to patient decreased functional mobility and right-sided weakness was admitted for a comprehensive rehab program.     Pt reports  RLE weakness worst, but feels weak in all limbs with tightness/spasticity in legs RLE> LLE.   Makes urine but is oliguric.  No pain- of any kind. Glaucoma is worse with ESRD/HD.    LBM yesterday.  Says has bedsore as well.  Review of Systems  Constitutional:  Positive for malaise/fatigue. Negative for chills and fever.  HENT:  Negative for hearing loss.   Eyes:  Negative for blurred vision and double vision.  Respiratory:  Positive for cough. Negative for shortness of breath and wheezing.   Cardiovascular:  Positive for leg swelling. Negative for chest pain and palpitations.  Gastrointestinal:  Negative for constipation, heartburn, nausea and vomiting.  Genitourinary:  Negative for dysuria, flank pain and hematuria.   Musculoskeletal:  Positive for joint pain and myalgias.  Skin:  Negative for rash.  Neurological:  Positive for weakness.  Psychiatric/Behavioral:         Anxiety  All other systems reviewed and are negative.       Past Medical History:  Diagnosis Date   BPH (benign prostatic hyperplasia)     Chronic kidney disease      ckd stage 3   Diabetes mellitus without complication (HCC)     Hypertension               Past Surgical History:  Procedure Laterality Date   EYE SURGERY       INGUINAL HERNIA REPAIR Bilateral 01/23/2018    Procedure: BILATERAL OPEN INGUINAL HERNIA REPAIR WITH MESH;  Surgeon: Jimmye Norman, MD;  Location: [facility] OR;  Service: General;  Laterality: Bilateral;   INSERTION OF MESH Bilateral 01/23/2018    Procedure: INSERTION OF MESH;  Surgeon: Jimmye Norman, MD;  Location: [facility] OR;  Service: General;  Laterality: Bilateral;   IR FLUORO GUIDE CV LINE RIGHT   08/15/2023   IR PATIENT EVAL TECH 0-60 MINS   11/18/2023   IR PERC CHOLECYSTOSTOMY   10/06/2023   IR US GUIDE BX ASP/DRAIN   10/06/2023   IR US GUIDE VASC ACCESS RIGHT   08/15/2023   VASECTOMY        ~1610        History reviewed. No pertinent family history.     Social History:  reports that he has never smoked. He has never used smokeless tobacco. He reports that he does not drink alcohol and does not use drugs. Allergies:  Allergies  No Known Allergies         Medications Prior to Admission  Medication Sig Dispense Refill   acetaminophen (TYLENOL) 650 MG CR tablet Take 650 mg by mouth every 6 (six) hours as needed for pain.       ALPRAZolam (XANAX) 0.25 MG tablet Take 1 tablet (0.25 mg total) by mouth 3 (three) times daily as needed for anxiety. 10 tablet 0   atorvastatin (LIPITOR) 20 MG tablet Take 1 tablet by mouth daily.       Cholecalciferol 125 MCG (5000 UT) capsule Take by mouth.       cycloSPORINE (RESTASIS) 0.05 % ophthalmic emulsion SMARTSIG:In Eye(s)       Dorzolamide HCl-Timolol Mal PF 2-0.5 %  SOLN Apply 1 drop to eye 2 (two) times daily.       Fluoxetine HCl, PMDD, 20 MG TABS Take 1 tablet by mouth daily.       folic acid (FOLVITE) 800 MCG tablet Take 800 mcg by mouth daily.       LANTUS 100 UNIT/ML injection Inject 26 Units into the skin daily.       latanoprost (XALATAN) 0.005 % ophthalmic solution Place 1 drop into both eyes at bedtime. (Patient taking differently: Place 1 drop into the left eye at bedtime.)       multivitamin (RENA-VIT) TABS tablet Take 1 tablet by mouth at bedtime.       nystatin cream (MYCOSTATIN) Apply 1 application topically 2 (two) times daily as needed. FOR YEAST OR SKIN RASHES.   2   ondansetron (ZOFRAN) 4 MG tablet Take by mouth 2 (two) times daily.       tamsulosin (FLOMAX) 0.4 MG CAPS capsule Take 0.4 mg by mouth daily with supper.   2  Home: Home Living Family/patient expects to be discharged to:: Private residence Living Arrangements: Spouse/significant other, Children Available Help at Discharge: Family, Available PRN/intermittently Type of Home: House Home Access: Stairs to enter Secretary/administrator of Steps: 5 Entrance Stairs-Rails: None Home Layout: Two level, Laundry or work area in basement, Able to live on main level with bedroom/bathroom, Full bath on main level Alternate Level Stairs-Number of Steps: 5 Alternate Level Stairs-Rails: Right Bathroom Shower/Tub: Engineer, manufacturing systems: Standard Bathroom Accessibility: Yes Home Equipment: Agricultural consultant (2 wheels), Shower seat, Hand held shower head, BSC/3in1 Additional Comments: per PT note  Lives With: Daughter   Functional History: Prior Function Prior Level of Function : Needs assist Physical Assist : ADLs (physical) ADLs (physical): Dressing, Bathing, Toileting, IADLs Mobility Comments: houshold ambulatory with RW, but decreased ability in the last 2 weeks. ADLs Comments: Assist for bathing, dressing, toileting. Able to groom and feed.   Functional  Status:  Mobility: Bed Mobility Overal bed mobility: Needs Assistance Bed Mobility: Supine to Sit Supine to sit: Contact guard General bed mobility comments: slow labored movement Transfers Overall transfer level: Needs assistance Equipment used: Rolling walker (2 wheels) Transfers: Sit to/from Stand, Bed to chair/wheelchair/BSC Sit to Stand: Mod assist Bed to/from chair/wheelchair/BSC transfer type:: Step pivot Step pivot transfers: Mod assist, Max assist General transfer comment: labored movement; poor weight bearing through R LE Ambulation/Gait Ambulation/Gait assistance: Mod assist, Max assist Gait Distance (Feet): 10 Feet Assistive device: Rolling walker (2 wheels) Gait Pattern/deviations: Knees buckling, Step-to pattern, Decreased step length - right, Decreased step length - left, Decreased stride length, Narrow base of support General Gait Details: Slow, labored movement with increased difficutly to move RLE, R knee started to buckle leading to ambulation being discontinued for patient safety Gait velocity: slow   ADL: ADL Overall ADL's : Needs assistance/impaired Eating/Feeding: Sitting, Set up Grooming: Moderate assistance, Sitting Upper Body Bathing: Moderate assistance, Sitting Lower Body Bathing: Moderate assistance, Sitting/lateral leans Upper Body Dressing : Moderate assistance, Sitting Lower Body Dressing: Moderate assistance, Sitting/lateral leans Toilet Transfer: Moderate assistance, Maximal assistance, Stand-pivot, Ambulation, Rolling walker (2 wheels) Toilet Transfer Details (indicate cue type and reason): Simulated via EOB to chair transfer and brief ambulation in the room. Toileting- Clothing Manipulation and Hygiene: Moderate assistance, Sitting/lateral lean Tub/ Shower Transfer: Moderate assistance, Maximal assistance, Rolling walker (2 wheels), Stand-pivot Functional mobility during ADLs: Moderate assistance, Maximal assistance, Rolling walker (2  wheels) General ADL Comments: Able to ambulate ~10 to 15 feet in total from the chair and back with RW.   Cognition: Cognition Orientation Level: Oriented X4 Cognition Arousal: Alert Behavior During Therapy: WFL for tasks assessed/performed   Physical Exam: Blood pressure 135/70, pulse (!) 57, temperature 98.7 F (37.1 C), temperature source Oral, resp. rate 13, height 6\' 1"  (1.854 m), weight 87.2 kg, SpO2 99%. Physical Exam Vitals and nursing note reviewed.  Constitutional:      General: He is not in acute distress.    Appearance: Normal appearance. He is normal weight.     Comments: Awake, alert, appropriate, sitting up; in bed; NAD  HENT:     Head: Normocephalic and atraumatic.     Right Ear: External ear normal.     Left Ear: External ear normal.     Nose: Nose normal. No congestion.     Mouth/Throat:     Mouth: Mucous membranes are dry.     Pharynx: Oropharynx is clear. No oropharyngeal exudate.  Eyes:     General:  Right eye: No discharge.        Left eye: No discharge.     Extraocular Movements: Extraocular movements intact.  Cardiovascular:     Rate and Rhythm: Normal rate and regular rhythm.     Heart sounds: Normal heart sounds. No murmur heard.    No gallop.  Pulmonary:     Effort: Pulmonary effort is normal. No respiratory distress.     Breath sounds: Normal breath sounds. No wheezing, rhonchi or rales.  Abdominal:     General: Bowel sounds are normal. There is no distension.     Palpations: Abdomen is soft.     Tenderness: There is no abdominal tenderness.  Musculoskeletal:     Cervical back: Neck supple.     Comments: RUE- biceps 4+/5, Triceps 4+/5; WE 5-/5; Grip 4/5; FA 4-/5 LUE- Biceps, triceps 5-/5; WE 5-/5; Grip 4+/5; FA 4/5 RLE- HF 4-/5; otherwise 4/5 rest of RLE LLE- 4+/5 throughout  Skin:    General: Skin is warm and dry.     Comments: R chest HD catheter- Bedsore on coccyx  Neurological:     Mental Status: He is oriented to person,  place, and time.     Comments: Patient is alert.  Answers basic questions.  Limited medical historian.  Follows simple commands MAS of 2 in RLE- few beats clonus B/L LE's and MAS of 1+ in LLE (+) hoffman's B/L 3+ DTRs B/L patella  Psychiatric:        Mood and Affect: Mood normal.        Behavior: Behavior normal.        Lab Results Last 48 Hours        Results for orders placed or performed during the hospital encounter of 12/18/23 (from the past 48 hours)  Glucose, capillary     Status: Abnormal    Collection Time: 12/21/23  7:26 AM  Result Value Ref Range    Glucose-Capillary 262 (H) 70 - 99 mg/dL      Comment: Glucose reference range applies only to samples taken after fasting for at least 8 hours.  Glucose, capillary     Status: Abnormal    Collection Time: 12/21/23 11:35 AM  Result Value Ref Range    Glucose-Capillary 340 (H) 70 - 99 mg/dL      Comment: Glucose reference range applies only to samples taken after fasting for at least 8 hours.  Basic metabolic panel     Status: Abnormal    Collection Time: 12/21/23  1:31 PM  Result Value Ref Range    Sodium 129 (L) 135 - 145 mmol/L    Potassium 4.1 3.5 - 5.1 mmol/L    Chloride 95 (L) 98 - 111 mmol/L    CO2 22 22 - 32 mmol/L    Glucose, Bld 349 (H) 70 - 99 mg/dL      Comment: Glucose reference range applies only to samples taken after fasting for at least 8 hours.    BUN 37 (H) 8 - 23 mg/dL    Creatinine, Ser 1.61 (H) 0.61 - 1.24 mg/dL    Calcium 8.8 (L) 8.9 - 10.3 mg/dL    GFR, Estimated 24 (L) >60 mL/min      Comment: (NOTE) Calculated using the CKD-EPI Creatinine Equation (2021)      Anion gap 12 5 - 15      Comment: Performed at [facility], [address] [place], [place] [address]  Magnesium     Status: None  Collection Time: 12/21/23  1:31 PM  Result Value Ref Range    Magnesium 1.8 1.7 - 2.4 mg/dL      Comment: Performed at [facility], [address] [place], [place] [address]  Glucose, capillary      Status: Abnormal    Collection Time: 12/21/23  3:58 PM  Result Value Ref Range    Glucose-Capillary 263 (H) 70 - 99 mg/dL      Comment: Glucose reference range applies only to samples taken after fasting for at least 8 hours.    Comment 1 Notify RN      Comment 2 Document in Chart    Glucose, capillary     Status: Abnormal    Collection Time: 12/21/23  4:45 PM  Result Value Ref Range    Glucose-Capillary 257 (H) 70 - 99 mg/dL      Comment: Glucose reference range applies only to samples taken after fasting for at least 8 hours.    Comment 1 Notify RN      Comment 2 Document in Chart    Glucose, capillary     Status: Abnormal    Collection Time: 12/21/23  8:23 PM  Result Value Ref Range    Glucose-Capillary 204 (H) 70 - 99 mg/dL      Comment: Glucose reference range applies only to samples taken after fasting for at least 8 hours.    Comment 1 Notify RN      Comment 2 Document in Chart    Glucose, capillary     Status: Abnormal    Collection Time: 12/22/23  7:14 AM  Result Value Ref Range    Glucose-Capillary 199 (H) 70 - 99 mg/dL      Comment: Glucose reference range applies only to samples taken after fasting for at least 8 hours.  Comprehensive metabolic panel     Status: Abnormal    Collection Time: 12/22/23 11:18 AM  Result Value Ref Range    Sodium 130 (L) 135 - 145 mmol/L    Potassium 3.9 3.5 - 5.1 mmol/L    Chloride 97 (L) 98 - 111 mmol/L    CO2 24 22 - 32 mmol/L    Glucose, Bld 276 (H) 70 - 99 mg/dL      Comment: Glucose reference range applies only to samples taken after fasting for at least 8 hours.    BUN 50 (H) 8 - 23 mg/dL    Creatinine, Ser 6.04 (H) 0.61 - 1.24 mg/dL    Calcium 8.6 (L) 8.9 - 10.3 mg/dL    Total Protein 5.6 (L) 6.5 - 8.1 g/dL    Albumin 2.9 (L) 3.5 - 5.0 g/dL    AST 39 15 - 41 U/L    ALT 45 (H) 0 - 44 U/L    Alkaline Phosphatase 86 38 - 126 U/L    Total Bilirubin 0.3 0.0 - 1.2 mg/dL    GFR, Estimated 23 (L) >60 mL/min      Comment:  (NOTE) Calculated using the CKD-EPI Creatinine Equation (2021)      Anion gap 9 5 - 15      Comment: Performed at [facility], [address], [place], [place] [address]  Magnesium     Status: None    Collection Time: 12/22/23 11:46 AM  Result Value Ref Range    Magnesium 1.8 1.7 - 2.4 mg/dL      Comment: Performed at [facility], [address], [place], [place] [address]  CBC  Status: Abnormal    Collection Time: 12/22/23 11:46 AM  Result Value Ref Range    WBC 8.5 4.0 - 10.5 K/uL    RBC 2.94 (L) 4.22 - 5.81 MIL/uL    Hemoglobin 8.9 (L) 13.0 - 17.0 g/dL    HCT 91.4 (L) 78.2 - 52.0 %    MCV 93.2 80.0 - 100.0 fL    MCH 30.3 26.0 - 34.0 pg    MCHC 32.5 30.0 - 36.0 g/dL    RDW 95.6 21.3 - 08.6 %    Platelets 148 (L) 150 - 400 K/uL    nRBC 0.0 0.0 - 0.2 %      Comment: Performed at [facility], [address], [place], [place] [address]  Glucose, capillary     Status: Abnormal    Collection Time: 12/22/23  4:20 PM  Result Value Ref Range    Glucose-Capillary 158 (H) 70 - 99 mg/dL      Comment: Glucose reference range applies only to samples taken after fasting for at least 8 hours.  Glucose, capillary     Status: Abnormal    Collection Time: 12/22/23  9:47 PM  Result Value Ref Range    Glucose-Capillary 153 (H) 70 - 99 mg/dL      Comment: Glucose reference range applies only to samples taken after fasting for at least 8 hours.    Comment 1 Notify RN      Comment 2 Document in Chart    CBC     Status: Abnormal    Collection Time: 12/23/23  3:48 AM  Result Value Ref Range    WBC 7.9 4.0 - 10.5 K/uL    RBC 3.00 (L) 4.22 - 5.81 MIL/uL    Hemoglobin 9.1 (L) 13.0 - 17.0 g/dL    HCT 96.2 (L) 95.2 - 52.0 %    MCV 92.7 80.0 - 100.0 fL    MCH 30.3 26.0 - 34.0 pg    MCHC 32.7 30.0 - 36.0 g/dL    RDW 84.1 32.4 - 40.1 %    Platelets 148 (L) 150 - 400 K/uL    nRBC 0.0 0.0 - 0.2 %      Comment: Performed at [facility], [address] [place], [place] [address]  Magnesium      Status: Abnormal    Collection Time: 12/23/23  3:48 AM  Result Value Ref Range    Magnesium 1.6 (L) 1.7 - 2.4 mg/dL      Comment: Performed at [facility], [address] [place], [place] [address]  Comprehensive metabolic panel     Status: Abnormal    Collection Time: 12/23/23  3:48 AM  Result Value Ref Range    Sodium 135 135 - 145 mmol/L    Potassium 3.7 3.5 - 5.1 mmol/L    Chloride 101 98 - 111 mmol/L    CO2 26 22 - 32 mmol/L    Glucose, Bld 130 (H) 70 - 99 mg/dL      Comment: Glucose reference range applies only to samples taken after fasting for at least 8 hours.    BUN 34 (H) 8 - 23 mg/dL    Creatinine, Ser 0.34 (H) 0.61 - 1.24 mg/dL    Calcium 8.5 (L) 8.9 - 10.3 mg/dL    Total Protein 5.5 (L) 6.5 - 8.1 g/dL    Albumin 2.9 (L) 3.5 - 5.0 g/dL    AST 38 15 - 41 U/L    ALT 46 (H) 0 - 44 U/L  Alkaline Phosphatase 79 38 - 126 U/L    Total Bilirubin 0.5 0.0 - 1.2 mg/dL    GFR, Estimated 37 (L) >60 mL/min      Comment: (NOTE) Calculated using the CKD-EPI Creatinine Equation (2021)      Anion gap 8 5 - 15      Comment: Performed at [facility], [address], [place], [place] [address]      Imaging Results (Last 48 hours)  No results found.         Blood pressure 135/70, pulse (!) 57, temperature 98.7 F (37.1 C), temperature source Oral, resp. rate 13, height 6\' 1"  (1.854 m), weight 87.2 kg, SpO2 99%.   Medical Problem List and Plan: 1. 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  Antithrombotics: -DVT/anticoagulation:  Pharmaceutical: Heparin             -antiplatelet therapy: Aspirin 81 mg daily 3. Pain Management: Tylenol as needed 4. Mood/Behavior/Sleep: Prozac 20 mg daily, Xanax 0.25 mg 3 times daily as needed             -antipsychotic agents: N/A 5. Neuropsych/cognition: This patient is capable of making decisions on his own behalf. 6.  Skin/Wound Care: Routine skin checks 7. Fluids/Electrolytes/Nutrition: Routine in and outs with follow-up chemistries 8.  End-stage renal disease.  Hemodialysis per renal services 9.  Type 2 diabetes mellitus, hemoglobin A1c 5.6.  Semglee 35 units daily. 10.  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  Discussed with Dr. Wynetta Emery neurosurgery placed on trial of steroids.  No current intervention at this time.  Needs to f/u with NSU after d/c.  11.  Anemia of chronic disease.  Follow-up CBC 12.  Glaucoma.  Legally blind left eye.  Continue eyedrops 13.  BPH.  Flomax 0.4 mg daily 14.  Hyperlipidemia.  Lipitor 15.  Hypertension.  Monitor with increased mobility. 16.  Recent cholecystitis.  Status post cholecystostomy tube placement drainage of abscess 12/16.  Follow-up outpatient 17.  Acute diarrhea.  LBM yesterday-had 5 Bms the day prior.   18. Spasticity- pt would benefit with Spasticity meds- will try Zanaflex 2 mg BID prn. Since cannot do Baclofen.      I have personally performed a face to face diagnostic evaluation of this patient and formulated the key components of the plan.  Additionally, I have personally reviewed laboratory data, imaging studies, as well as relevant notes and concur with the physician assistant's documentation above.   The patient's status has not changed from the original H&P.  Any changes in documentation from the acute care chart have been noted above.         Mcarthur Rossetti Angiulli, PA-C 12/23/2023

## 2023-12-23 NOTE — Progress Notes (Signed)
 INPATIENT REHABILITATION ADMISSION NOTE   Arrival Method: [facility]     Mental Orientation: oriented x4   Assessment: done   Skin: assessed   IV'S: none   Pain: none HD cath to right chest   Tubes and Drains: none   Safety Measures: reviewed with pt   Vital Signs: done   Height and Weight: done   Rehab Orientation: done    Family: notified by pt    Notes: done  Marylu Lund, RN

## 2023-12-23 NOTE — Progress Notes (Signed)
 Patient ID: Philip Benjamin, male   DOB: 12-26-1950, 73 y.o.   MRN: [id number] S: No new complaints. Diarrhea slowing.  Says transferring to [facility] CIR today.    O:BP 135/70 (BP Location: Right Arm)   Pulse (!) 57   Temp 98.7 F (37.1 C) (Oral)   Resp 13   Ht 6\' 1"  (1.854 m)   Wt 87.2 kg   SpO2 99%   BMI 25.36 kg/m   Intake/Output Summary (Last 24 hours) at 12/23/2023 0801 Last data filed at 12/23/2023 0500 Gross per 24 hour  Intake 120 ml  Output 1600 ml  Net -1480 ml   Intake/Output: I/O last 3 completed shifts: In: 120 [P.O.:120] Out: 2250 [Urine:1250; Other:1000]  Intake/Output this shift:  No intake/output data recorded. Weight change:  Gen: NAD CVS: RRR Resp:CTA Abd: +BS, soft, NT/ND Ext: no edema  Recent Labs  Lab 12/18/23 0922 12/20/23 0930 12/21/23 1331 12/22/23 1118 12/23/23 0348  NA 137 133* 129* 130* 135  K 3.5 3.7 4.1 3.9 3.7  CL 98 97* 95* 97* 101  CO2 27 25 22 24 26   GLUCOSE 99 284* 349* 276* 130*  BUN 20 21 37* 50* 34*  CREATININE 3.02* 2.44* 2.74* 2.78* 1.87*  ALBUMIN  --  3.1*  --  2.9* 2.9*  CALCIUM 9.3 8.7* 8.8* 8.6* 8.5*  PHOS  --  2.7  --   --   --   AST  --   --   --  39 38  ALT  --   --   --  45* 46*   Liver Function Tests: Recent Labs  Lab 12/20/23 0930 12/22/23 1118 12/23/23 0348  AST  --  39 38  ALT  --  45* 46*  ALKPHOS  --  86 79  BILITOT  --  0.3 0.5  PROT  --  5.6* 5.5*  ALBUMIN 3.1* 2.9* 2.9*   No results for input(s): "LIPASE", "AMYLASE" in the last 168 hours. No results for input(s): "AMMONIA" in the last 168 hours. CBC: Recent Labs  Lab 12/18/23 0922 12/20/23 0930 12/22/23 1146 12/23/23 0348  WBC 5.6 7.0 8.5 7.9  HGB 10.0* 9.0* 8.9* 9.1*  HCT 31.8* 28.5* 27.4* 27.8*  MCV 96.4 95.0 93.2 92.7  PLT 129* 131* 148* 148*   Cardiac Enzymes: No results for input(s): "CKTOTAL", "CKMB", "CKMBINDEX", "TROPONINI" in the last 168 hours. CBG: Recent Labs  Lab 12/21/23 2023 12/22/23 0714 12/22/23 1620 12/22/23 2147  12/23/23 0736  GLUCAP 204* 199* 158* 153* 125*    Iron Studies: No results for input(s): "IRON", "TIBC", "TRANSFERRIN", "FERRITIN" in the last 72 hours. Studies/Results: No results found.   stroke: early stages of recovery book   Does not apply Once   aspirin  81 mg Oral Daily   atorvastatin  40 mg Oral Daily   Chlorhexidine Gluconate Cloth  6 each Topical Q0600   Chlorhexidine Gluconate Cloth  6 each Topical Q0600   cycloSPORINE  1 drop Both Eyes BID   dexamethasone  4 mg Oral Q12H   dorzolamide-timolol  1 drop Both Eyes BID   feeding supplement (NEPRO CARB STEADY)  237 mL Oral BID BM   FLUoxetine  20 mg Oral Daily   heparin  5,000 Units Subcutaneous Q8H   insulin aspart  0-20 Units Subcutaneous TID WC   insulin aspart  0-5 Units Subcutaneous QHS   insulin glargine-yfgn  35 Units Subcutaneous Daily   latanoprost  1 drop Left Eye QHS   multivitamin  1  tablet Oral QHS   tamsulosin  0.4 mg Oral Q supper    BMET    Component Value Date/Time   NA 135 12/23/2023 0348   K 3.7 12/23/2023 0348   CL 101 12/23/2023 0348   CO2 26 12/23/2023 0348   GLUCOSE 130 (H) 12/23/2023 0348   BUN 34 (H) 12/23/2023 0348   CREATININE 1.87 (H) 12/23/2023 0348   CALCIUM 8.5 (L) 12/23/2023 0348   GFRNONAA 37 (L) 12/23/2023 0348   GFRAA 44 (L) 01/14/2018 1112   CBC    Component Value Date/Time   WBC 7.9 12/23/2023 0348   RBC 3.00 (L) 12/23/2023 0348   HGB 9.1 (L) 12/23/2023 0348   HCT 27.8 (L) 12/23/2023 0348   PLT 148 (L) 12/23/2023 0348   MCV 92.7 12/23/2023 0348   MCH 30.3 12/23/2023 0348   MCHC 32.7 12/23/2023 0348   RDW 11.5 12/23/2023 0348   LYMPHSABS 1.5 10/10/2023 0414   MONOABS 1.3 (H) 10/10/2023 0414   EOSABS 0.2 10/10/2023 0414   BASOSABS 0.0 10/10/2023 0414    Outpatient HD orders: [facility], MWF. 4 hours. EDW 86.5 kg. TDC (has appt coming up with [facility] on 10/07/23 for perm access consideration). Flow rates: 350/500. 2K, 2.5Ca. Heparin: 1000 units/hr. Meds: Mircera 60 mcg  every 4 weeks (last dose 12/08/23), Venofer 50mg  every Monday.   Assessment/Plan: ESRD - HD per MWF schedule; next HD tomorrow.  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  Patient is at goal.   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 Phos at goal. H/O Cholecystitis - S/p Surgery last admission with IR placed cholecystostomy tube and drain on 10/06/23 which has since been removed.  No complaints of fever, chills, nausea, abdominal pain but sometimes his appetite is not great which he believes is because of the cholecystitis.   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  Neurosurgery made aware and recommended close follow up and surgery in the future.  Trial of steroids for now  Disposition - per primary team.  Appears plan is CIR today.   Estill Bakes MD [facility] Pager [phone]

## 2023-12-23 NOTE — Discharge Instructions (Addendum)
 Inpatient Rehab Discharge Instructions  Philip Benjamin Discharge date and time: No discharge date for patient encounter.   Activities/Precautions/ Functional Status: Activity: activity as tolerated Diet: renal diet Wound Care: Routine skin checks Functional status:  ___ No restrictions     ___ Walk up steps independently ___ 24/7 supervision/assistance   ___ Walk up steps with assistance ___ Intermittent supervision/assistance  ___ Bathe/dress independently ___ Walk with walker     _x__ Bathe/dress with assistance ___ Walk Independently    ___ Shower independently ___ Walk with assistance    ___ Shower with assistance ___ No alcohol     ___ Return to work/school ________  Special Instructions: No driving smoking or alcohol   COMMUNITY REFERRALS UPON DISCHARGE:    Home Health:   PT    OT    RN    AIDE                Agency:[facility] Phone:[phone]    Medical Equipment/Items Ordered:                                                 Agency/Supplier:    STROKE/TIA DISCHARGE INSTRUCTIONS SMOKING Cigarette smoking nearly doubles your risk of having a stroke & is the single most alterable risk factor  If you smoke or have smoked in the last 12 months, you are advised to quit smoking for your health. Most of the excess cardiovascular risk related to smoking disappears within a year of stopping. Ask you doctor about anti-smoking medications [place] Quit Line: 1-800-QUIT NOW Free Smoking Cessation Classes [phone]  CHOLESTEROL Know your levels; limit fat & cholesterol in your diet  Lipid Panel     Component Value Date/Time   CHOL 80 12/18/2023 0925   TRIG 65 12/18/2023 0925   HDL 39 (L) 12/18/2023 0925   CHOLHDL 2.1 12/18/2023 0925   VLDL 13 12/18/2023 0925   LDLCALC 28 12/18/2023 0925     Many patients benefit from treatment even if their cholesterol is at goal. Goal: Total Cholesterol (CHOL) less than 160 Goal:  Triglycerides (TRIG) less than 150 Goal:  HDL  greater than 40 Goal:  LDL (LDLCALC) less than 100   BLOOD PRESSURE American Stroke Association blood pressure target is less that 120/80 mm/Hg  Your discharge blood pressure is:  BP: (!) 154/74 Monitor your blood pressure Limit your salt and alcohol intake Many individuals will require more than one medication for high blood pressure  DIABETES (A1c is a blood sugar average for last 3 months) Goal HGBA1c is under 7% (HBGA1c is blood sugar average for last 3 months)  Diabetes:    Lab Results  Component Value Date   HGBA1C 5.6 12/18/2023    Your HGBA1c can be lowered with medications, healthy diet, and exercise. Check your blood sugar as directed by your physician Call your physician if you experience unexplained or low blood sugars.  PHYSICAL ACTIVITY/REHABILITATION Goal is 30 minutes at least 4 days per week  Activity: Increase activity slowly, Therapies: Physical Therapy: Home Health Return to work:  Activity decreases your risk of heart attack and stroke and makes your heart stronger.  It helps control your weight and blood pressure; helps you relax and can improve your mood. Participate in a regular exercise program. Talk with your doctor about the best form of exercise for you (  dancing, walking, swimming, cycling).  DIET/WEIGHT Goal is to maintain a healthy weight  Your discharge diet is:  Diet Order             Diet renal with fluid restriction Fluid restriction: 1200 mL Fluid; Room service appropriate? Yes; Fluid consistency: Thin  Diet effective now                   liquids Your height is:    Your current weight is:   Your Body Mass Index (BMI) is:    Following the type of diet specifically designed for you will help prevent another stroke. Your goal weight range is:   Your goal Body Mass Index (BMI) is 19-24. Healthy food habits can help reduce 3 risk factors for stroke:  High cholesterol, hypertension, and excess weight.  RESOURCES Stroke/Support Group:  Call  [phone]   STROKE EDUCATION PROVIDED/REVIEWED AND GIVEN TO PATIENT Stroke warning signs and symptoms How to activate emergency medical system (call 911). Medications prescribed at discharge. Need for follow-up after discharge. Personal risk factors for stroke. Pneumonia vaccine given: No Flu vaccine given: No My questions have been answered, the writing is legible, and I understand these instructions.  I will adhere to these goals & educational materials that have been provided to me after my discharge from the hospital.      My questions have been answered and I understand these instructions. I will adhere to these goals and the provided educational materials after my discharge from the hospital.  Patient/Caregiver Signature _______________________________ Date __________  Clinician Signature _______________________________________ Date __________  Please bring this form and your medication list with you to all your follow-up doctor's appointments.     [facility]  Discharge Instructions  AV Fistula or Graft Surgery for Dialysis Access  Please refer to the following instructions for your post-procedure care. Your surgeon or physician assistant will discuss any changes with you.  Activity  You may drive the day following your surgery, if you are comfortable and no longer taking prescription pain medication. Resume full activity as the soreness in your incision resolves.  Bathing/Showering  You may shower after you go home. Keep your incision dry for 48 hours. Do not soak in a bathtub, hot tub, or swim until the incision heals completely. You may not shower if you have a hemodialysis catheter.  Incision Care  Clean your incision with mild soap and water after 48 hours. Pat the area dry with a clean towel. You do not need a bandage unless otherwise instructed. Do not apply any ointments or creams to your incision. You may have skin glue on  your incision. Do not peel it off. It will come off on its own in about one week. Your arm may swell a bit after surgery. To reduce swelling use pillows to elevate your arm so it is above your heart. Your doctor will tell you if you need to lightly wrap your arm with an ACE bandage.  Diet  Resume your normal diet. There are not special food restrictions following this procedure. In order to heal from your surgery, it is CRITICAL to get adequate nutrition. Your body requires vitamins, minerals, and protein. Vegetables are the best source of vitamins and minerals. Vegetables also provide the perfect balance of protein. Processed food has little nutritional value, so try to avoid this.  Medications  Resume taking all of your medications. If your incision is causing pain, you may take over-the counter  pain relievers such as acetaminophen (Tylenol). If you were prescribed a stronger pain medication, please be aware these medications can cause nausea and constipation. Prevent nausea by taking the medication with a snack or meal. Avoid constipation by drinking plenty of fluids and eating foods with high amount of fiber, such as fruits, vegetables, and grains.  Do not take Tylenol if you are taking prescription pain medications.  Follow up Your surgeon may want to see you in the office following your access surgery. If so, this will be arranged at the time of your surgery.  Please call us immediately for any of the following conditions:  Increased pain, redness, drainage (pus) from your incision site Fever of 101 degrees or higher Severe or worsening pain at your incision site Hand pain or numbness.  Reduce your risk of vascular disease:  Stop smoking. If you would like help, call [facility] at 1-800-QUIT-NOW (610 274 1301) or [place] at (719)102-8280  Manage your cholesterol Maintain a desired weight Control your diabetes Keep your blood pressure down  Dialysis  It will take several  weeks to several months for your new dialysis access to be ready for use. Your surgeon will determine when it is okay to use it. Your nephrologist will continue to direct your dialysis. You can continue to use your Permcath until your new access is ready for use.   01/02/2024 Philip Benjamin [id number] 1951/07/12  Surgeon(s): Nada Libman, MD  Procedure(s): Creation left 1st stage basilic vein transposition   x Do not stick fistula for 12 weeks    If you have any questions, please call the office at [phone].

## 2023-12-23 NOTE — Progress Notes (Signed)
 Inpatient Rehabilitation Center Individual Statement of Services  Patient Name:  Philip Benjamin  Date:  12/23/2023  Welcome to the Inpatient Rehabilitation Center.  Our goal is to provide you with an individualized program based on your diagnosis and situation, designed to meet your specific needs.  With this comprehensive rehabilitation program, you will be expected to participate in at least 3 hours of rehabilitation therapies Monday-Friday, with modified therapy programming on the weekends.  Your rehabilitation program will include the following services:  Physical Therapy (PT), Occupational Therapy (OT), 24 hour per day rehabilitation nursing, Therapeutic Recreaction (TR), Neuropsychology, Care Coordinator, Rehabilitation Medicine, Nutrition Services, and Pharmacy Services  Weekly team conferences will be held on Wednesday to discuss your progress.  Your Inpatient Rehabilitation Care Coordinator will talk with you frequently to get your input and to update you on team discussions.  Team conferences with you and your family in attendance may also be held.  Expected length of stay: 12-14 days  Overall anticipated outcome: independent-CGA level Min for B & D  Depending on your progress and recovery, your program may change. Your Inpatient Rehabilitation Care Coordinator will coordinate services and will keep you informed of any changes. Your Inpatient Rehabilitation Care Coordinator's name and contact numbers are listed  below.  The following services may also be recommended but are not provided by the Inpatient Rehabilitation Center:   Home Health Rehabiltiation Services Outpatient Rehabilitation Services    Arrangements will be made to provide these services after discharge if needed.  Arrangements include referral to agencies that provide these services.  Your insurance has been verified to be:  Medicare & AARP Your primary doctor is:  Kirstie Peri  Pertinent information will be shared  with your doctor and your insurance company.  Inpatient Rehabilitation Care Coordinator:  Dossie Der, [place] [phone] or Luna Glasgow  Information discussed with and copy given to patient by: Lucy Chris, 12/23/2023, 2:11 PM

## 2023-12-23 NOTE — Progress Notes (Signed)
 PMR Admission Coordinator Pre-Admission Assessment   Patient: Philip Benjamin is an 73 y.o., male MRN: [id number] DOB: Sep 26, 1951 Height: 6\' 1"  (185.4 cm) Weight: 86 kg   Insurance Information HMO:     PPO:      PCP:      IPA:      80/20:      OTHER:  PRIMARY: Medicare A and B      Policy#: [id number] Subscriber:  CM Name:       Phone#:      Fax#:  Pre-Cert#: verified Health and safety inspector:  Benefits:  Phone #:      Name:  Eff. Date: part a and  Part B 04/21/2007   Deduct: $1632      Out of Pocket Max: n/a      Life Max: n/a CIR: 100%      SNF: 20 full days Outpatient:      Co-Pay:  Home Health: 100%      Co-Pay:  DME:      Co-Pay:  Providers:  SECONDARY: aarp      Policy#: [id number]     Phone#:    Financial Counselor:       Phone#:    The "Data Collection Information Summary" for patients in Inpatient Rehabilitation Facilities with attached "Privacy Act Statement-Health Care Records" was provided and verbally reviewed with: Patient   Emergency Contact Information Contact Information       Name Relation Home Work Mobile    Holford,[name] Daughter     [phone]         Other Contacts   None on File        Current Medical History  Patient Admitting Diagnosis: CVA Philip Benjamin is a 73 year old right-handed male with history significant for type 2 diabetes mellitus, anemia of chronic disease, BPH, hypertension, hyperlipidemia, end-stage renal disease with hemodialysis Monday Wednesday and Friday, glaucoma of the left eye/legally blind.  Per chart review patient lives with spouse and children.  Two-level home bed and bath main level and 5 steps to entry.  Household ambulator with rolling walker.  Daughter and brother can provide assistance on discharge.  Presented to [facility] 12/18/2023 with right side weakness x 48 hours.  Patient with recent admission 09/29/2023 - 10/10/2023 secondary to general malaise, nausea and weakness with nonproductive cough and was  discharged to skilled nursing facility ambulating min mod assist 30 feet.  At that time workup demonstrated positive COVID-19 with mild dehydration and hypokalemia.  Patient also with recent admission 08/21/2023 - 08/25/2023 treated for sepsis secondary to Pseudomonas UTI and lobar pneumonia.  He was treated with IV cefepime and discharged with Cipro.  During patient's latest admission noted acute cholecystitis concern for abscess CT of abdomen showed distended gallbladder with pericholecystic stranding underwent cholecystostomy tube placement with drainage of abscess 12/16 completing course of Zosyn and was discharged for short time to skilled nursing facility before returning home.  Upon present admission of 12/18/2023 due to right sided weakness MRI of the brain showed a 3 mm acute infarct within the left lentiform nucleus.  Background parenchymal atrophy, advanced chronic small vessel ischemic disease and chronic infarcts.  Several nonspecific chronic microhemorrhages scattered within the supratentorial and infratentorial brain.  Carotid ultrasound without significant plaque formation.  MRA showed no evidence of intracranial large vessel occlusion or stenosis.  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  No findings to suggest acute disc herniation.  Multilevel foraminal stenosis greatest and severe on the left at C4-5 and C5-6.  Echocardiogram with ejection fraction of 60 to 65%.  The left ventricle showing no regional wall motion abnormalities.  Admission chemistries SARS coronavirus negative, BUN 20, creatinine 3.02, hemoglobin 10, hemoglobin A1c 5.6.  Neurology follow-up maintained on low-dose aspirin.  Subcutaneous heparin initiated for DVT prophylaxis.  Renal service follow-up with hemodialysis as directed.  Dr. 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  Patient is tolerating a regular consistency diet.  Therapy evaluations completed due to patient decreased functional mobility and right-sided weakness was admitted for a comprehensive rehab program.    Complete NIHSS TOTAL: 0   Patient's medical record from [facility] has been reviewed by the rehabilitation admission coordinator and physician.   Past Medical History      Past Medical History:  Diagnosis Date   BPH (benign prostatic hyperplasia)     Chronic kidney disease      ckd stage 3   Diabetes mellitus without complication (HCC)     Hypertension            Has the patient had major surgery during 100 days prior to admission? Yes   Family History   family history is not on file.   Current Medications  Current Medications    Current Facility-Administered Medications:     stroke: early stages of recovery book, , Does not apply, Once, Shahmehdi, Seyed A, MD   acetaminophen (TYLENOL) tablet 650 mg, 650 mg, Oral, Q4H PRN, 650 mg at 12/19/23 2140 **OR** acetaminophen (TYLENOL) 160 MG/5ML solution 650 mg, 650 mg, Per Tube, Q4H PRN **OR** acetaminophen (TYLENOL) suppository 650 mg, 650 mg, Rectal, Q4H PRN, Shahmehdi, Seyed A, MD   ALPRAZolam (XANAX) tablet 0.25 mg, 0.25 mg, Oral, TID PRN, Shahmehdi, Seyed A, MD   alteplase (CATHFLO ACTIVASE) injection 2 mg, 2 mg, Intracatheter, Once PRN, Ethelene Hal, MD   anticoagulant sodium citrate solution 5 mL, 5 mL, Intracatheter, PRN, Ethelene Hal, MD   aspirin chewable tablet 81 mg, 81 mg, Oral, Daily, Melynda Ripple, Priyanka O, MD, 81 mg at 12/21/23 0829   atorvastatin (LIPITOR) tablet 40 mg, 40 mg, Oral, Daily, Shahmehdi, Seyed A, MD, 40 mg at 12/20/23 2245   Chlorhexidine Gluconate Cloth 2 % PADS 6 each, 6 each, Topical, Q0600, Ethelene Hal, MD, 6 each at 12/21/23 [phone]   cycloSPORINE (RESTASIS) 0.05 % ophthalmic emulsion 1  drop, 1 drop, Both Eyes, BID, Shahmehdi, Seyed A, MD, 1 drop at 12/21/23 0835   dexamethasone (DECADRON) tablet 4 mg, 4 mg, Oral, Q12H, Shahmehdi, Seyed A, MD, 4 mg at 12/21/23 1403   dorzolamide-timolol (COSOPT) 2-0.5 % ophthalmic solution 1 drop, 1 drop, Both Eyes, BID, Shahmehdi, Seyed A, MD, 1 drop at 12/21/23 0828   feeding supplement (NEPRO CARB STEADY) liquid 237 mL, 237 mL, Oral, BID BM, Shahmehdi, Seyed A, MD, 237 mL at 12/21/23 1402   FLUoxetine (PROZAC) capsule 20 mg, 20 mg, Oral, Daily, Shahmehdi, Seyed A, MD, 20 mg at 12/21/23 0829   heparin injection 1,000 Units, 1,000 Units, Intracatheter, PRN, Ethelene Hal, MD   heparin injection 5,000 Units, 5,000 Units, Subcutaneous, Q8H, Shahmehdi, Seyed A, MD, 5,000 Units at 12/21/23 1402   insulin aspart (novoLOG) injection 0-20 Units, 0-20 Units, Subcutaneous, TID  WC, Shahmehdi, Seyed A, MD   insulin aspart (novoLOG) injection 0-5 Units, 0-5 Units, Subcutaneous, QHS, Shahmehdi, Seyed A, MD   [START ON 12/22/2023] insulin glargine-yfgn (SEMGLEE) injection 35 Units, 35 Units, Subcutaneous, Daily, Shahmehdi, Seyed A, MD   latanoprost (XALATAN) 0.005 % ophthalmic solution 1 drop, 1 drop, Left Eye, QHS, Shahmehdi, Seyed A, MD, 1 drop at 12/20/23 2246   multivitamin (RENA-VIT) tablet 1 tablet, 1 tablet, Oral, QHS, Shahmehdi, Seyed A, MD, 1 tablet at 12/20/23 2246   senna-docusate (Senokot-S) tablet 1 tablet, 1 tablet, Oral, QHS PRN, Shahmehdi, Seyed A, MD   tamsulosin (FLOMAX) capsule 0.4 mg, 0.4 mg, Oral, Q supper, Shahmehdi, Seyed A, MD, 0.4 mg at 12/20/23 1700     Patients Current Diet:  Diet Order                  Diet renal with fluid restriction Fluid restriction: 1200 mL Fluid; Room service appropriate? Yes; Fluid consistency: Thin  Diet effective now                         Precautions / Restrictions Precautions Precautions: Fall Restrictions Weight Bearing Restrictions Per Provider Order: No    Has the patient had 2 or more  falls or a fall with injury in the past year? Yes   Prior Activity Level Community (5-7x/wk): Pt. active in the community PTA   Prior Functional Level Self Care: Did the patient need help bathing, dressing, using the toilet or eating? Needed some help   Indoor Mobility: Did the patient need assistance with walking from room to room (with or without device)? Needed some help   Stairs: Did the patient need assistance with internal or external stairs (with or without device)? Needed some help   Functional Cognition: Did the patient need help planning regular tasks such as shopping or remembering to take medications? Needed some help   Patient Information Are you of Hispanic, Latino/a,or Spanish origin?: A. No, not of Hispanic, Latino/a, or Spanish origin What is your race?: B. Black or African American Do you need or want an interpreter to communicate with a doctor or health care staff?: 0. No   Patient's Response To:  Health Literacy and Transportation Is the patient able to respond to health literacy and transportation needs?: Yes Health Literacy - How often do you need to have someone help you when you read instructions, pamphlets, or other written material from your doctor or pharmacy?: Never In the past 12 months, has lack of transportation kept you from medical appointments or from getting medications?: No In the past 12 months, has lack of transportation kept you from meetings, work, or from getting things needed for daily living?: No   Home Assistive Devices / Equipment Home Equipment: Agricultural consultant (2 wheels), Shower seat, Hand held shower head, BSC/3in1   Prior Device Use: Indicate devices/aids used by the patient prior to current illness, exacerbation or injury? Manual wheelchair and Walker   Current Functional Level Cognition   Orientation Level: Oriented X4    Extremity Assessment (includes Sensation/Coordination)   Upper Extremity Assessment: Defer to OT  evaluation RUE Deficits / Details: 2+/5 bilateral shoulder flexion and abduction; 5/5 elbow flexion and extension; 4/5 wrist flexion and extension, 4-/5 grip. RUE Sensation: decreased light touch, decreased proprioception RUE Coordination: decreased fine motor, decreased gross motor LUE Deficits / Details: 2+/5 shoulder flexion and abduction; generally weak othrwise but noted poor propriocepition for finger to nose test. LUE Sensation:  decreased proprioception LUE Coordination: decreased gross motor  Lower Extremity Assessment: Generalized weakness     ADLs   Overall ADL's : Needs assistance/impaired Eating/Feeding: Sitting, Set up Grooming: Moderate assistance, Sitting Upper Body Bathing: Moderate assistance, Sitting Lower Body Bathing: Moderate assistance, Sitting/lateral leans Upper Body Dressing : Moderate assistance, Sitting Lower Body Dressing: Moderate assistance, Sitting/lateral leans Toilet Transfer: Moderate assistance, Maximal assistance, Stand-pivot, Ambulation, Rolling walker (2 wheels) Toilet Transfer Details (indicate cue type and reason): Simulated via EOB to chair transfer and brief ambulation in the room. Toileting- Clothing Manipulation and Hygiene: Moderate assistance, Sitting/lateral lean Tub/ Shower Transfer: Moderate assistance, Maximal assistance, Rolling walker (2 wheels), Stand-pivot Functional mobility during ADLs: Moderate assistance, Maximal assistance, Rolling walker (2 wheels) General ADL Comments: Able to ambulate ~10 to 15 feet in total from the chair and back with RW.     Mobility   Overal bed mobility: Needs Assistance Bed Mobility: Supine to Sit Supine to sit: Contact guard General bed mobility comments: slow labored movement     Transfers   Overall transfer level: Needs assistance Equipment used: Rolling walker (2 wheels) Transfers: Sit to/from Stand, Bed to chair/wheelchair/BSC Sit to Stand: Mod assist Bed to/from chair/wheelchair/BSC transfer  type:: Step pivot Step pivot transfers: Mod assist, Max assist General transfer comment: labored movement; poor weight bearing through R LE     Ambulation / Gait / Stairs / Wheelchair Mobility   Ambulation/Gait Ambulation/Gait assistance: Mod assist, Max assist Gait Distance (Feet): 10 Feet Assistive device: Rolling walker (2 wheels) Gait Pattern/deviations: Knees buckling, Step-to pattern, Decreased step length - right, Decreased step length - left, Decreased stride length, Narrow base of support General Gait Details: Slow, labored movement with increased difficutly to move RLE, R knee started to buckle leading to ambulation being discontinued for patient safety Gait velocity: slow     Posture / Balance Dynamic Sitting Balance Sitting balance - Comments: poor to fair Balance Overall balance assessment: Needs assistance Sitting-balance support: Feet supported, No upper extremity supported Sitting balance-Leahy Scale: Poor Sitting balance - Comments: poor to fair Postural control: Posterior lean Standing balance support: Bilateral upper extremity supported, During functional activity, Reliant on assistive device for balance Standing balance-Leahy Scale: Poor Standing balance comment: using RW     Special needs/care consideration Dialysis: Hemodialysis Monday, Wednesday, and Friday    Previous Home Environment (from acute therapy documentation) Living Arrangements: Spouse/significant other, Children  Lives With: Daughter Available Help at Discharge: Family, Available PRN/intermittently Type of Home: House Home Layout: Two level, Laundry or work area in basement, Able to live on main level with bedroom/bathroom, Full bath on main level Alternate Level Stairs-Rails: Right Alternate Level Stairs-Number of Steps: 5 Home Access: Stairs to enter Entrance Stairs-Rails: None Entrance Stairs-Number of Steps: 5 Bathroom Shower/Tub: Automotive engineer: Yes How Accessible: Accessible via walker Home Care Services: Yes Type of Home Care Services: Home RN, Home OT, Home PT Home Care Agency (if known): [facility] Additional Comments: per PT note   Discharge Living Setting Plans for Discharge Living Setting: Patient's home Type of Home at Discharge: House Discharge Home Layout: Two level Alternate Level Stairs-Rails: Right Alternate Level Stairs-Number of Steps: 5 Discharge Home Access: Stairs to enter Entrance Stairs-Rails: Right Entrance Stairs-Number of Steps: 5 Discharge Bathroom Shower/Tub: Tub/shower unit Discharge Bathroom Toilet: Standard Discharge Bathroom Accessibility: Yes Does the patient have any problems obtaining your medications?: No   Social/Family/Support Systems Patient Roles: Other (Comment) Contact Information: [phone] Anticipated Caregiver: Amad Mau Anticipated  Caregiver's Contact Information: Daughter works nights but Pt.'s son can stay while she works Ability/Limitations of Caregiver: Min A Caregiver Availability: 24/7 Discharge Plan Discussed with Primary Caregiver: Yes Is Caregiver In Agreement with Plan?: Yes Does Caregiver/Family have Issues with Lodging/Transportation while Pt is in Rehab?: Yes   Goals Patient/Family Goal for Rehab: PT/OT min A Pt/Family Agrees to Admission and willing to participate: Yes Program Orientation Provided & Reviewed with Pt/Caregiver Including Roles  & Responsibilities: Yes   Decrease burden of Care through IP rehab admission:  not anticipated   Possible need for SNF placement upon discharge: not anticipated   Patient Condition: I have reviewed medical records from [facility], spoken with CM, and patient. I met with patient at the bedside for inpatient rehabilitation assessment.  Patient will benefit from ongoing PT and OT, can actively participate in 3 hours of therapy a day 5 days of the week, and can make measurable gains during the  admission.  Patient will also benefit from the coordinated team approach during an Inpatient Acute Rehabilitation admission.  The patient will receive intensive therapy as well as Rehabilitation physician, nursing, social worker, and care management interventions.  Due to safety, skin/wound care, disease management, medication administration, pain management, and patient education the patient requires 24 hour a day rehabilitation nursing.  The patient is currently min A with mobility and basic ADLs.  Discharge setting and therapy post discharge at home with home health is anticipated.  Patient has agreed to participate in the Acute Inpatient Rehabilitation Program and will admit today.   Preadmission Screen Completed By:  Jeronimo Greaves, 12/21/2023 2:09 PM ______________________________________________________________________   Discussed status with Dr. Berline Chough on 12/23/23 at 1004 and received approval for admission today.   Admission Coordinator:  Jeronimo Greaves, CCC-SLP, time 1004/Date 12/23/23    Assessment/Plan: Diagnosis: L lentiform stroke  Does the need for close, 24 hr/day Medical supervision in concert with the patient's rehab needs make it unreasonable for this patient to be served in a less intensive setting? 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 Yes Does the patient require coordinated care of a physician, rehab nurse, PT, OT, and SLP to address physical and functional deficits in the context of the above medical diagnosis(es)? Yes Addressing deficits in the following areas: balance, endurance, locomotion, strength, transferring, bowel/bladder control, bathing, dressing, feeding, grooming, toileting, and cognition Can  the patient actively participate in an intensive therapy program of at least 3 hrs of therapy 5 days a week? Yes The potential for patient to make measurable gains while on inpatient rehab is good Anticipated functional outcomes upon discharge from inpatient rehab: supervision and min assist PT, supervision and min assist OT, n/a SLP Estimated rehab length of stay to reach the above functional goals is: 2-3 weeks Anticipated discharge destination: Home 10. Overall Rehab/Functional Prognosis: good     MD Signature:

## 2023-12-23 NOTE — Progress Notes (Signed)
 Patient ID: Philip Benjamin, male   DOB: May 19, 1951, 73 y.o.   MRN: [id number] Met with the patient to review current medical situation, rehab process, team conference and plan of care. Reviewed secondary risk management including DM (A1C 5.6) on Semglee, HTN (N/A post HD), HLD on Lipitor with HD (M-W-F at [facility] in [place]). 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 Reviewed renal diet with 1200 CC  FR; patient noted an understanding of information.  Denies pain and only concern is new diarrhea. Continue to follow along to address educational needs to facilitate preparation for discharge. Pamelia Hoit

## 2023-12-23 NOTE — Progress Notes (Signed)
 Inpatient Rehabilitation Care Coordinator Assessment and Plan Patient Details  Name: Philip Benjamin MRN: [id number] Date of Birth: Aug 31, 1951  Today's Date: 12/23/2023  Hospital Problems: Principal Problem:   Left middle cerebral artery stroke [facility])  Past Medical History:  Past Medical History:  Diagnosis Date   BPH (benign prostatic hyperplasia)    Chronic kidney disease    ckd stage 3   Diabetes mellitus without complication (HCC)    Hypertension    Past Surgical History:  Past Surgical History:  Procedure Laterality Date   EYE SURGERY     INGUINAL HERNIA REPAIR Bilateral 01/23/2018   Procedure: BILATERAL OPEN INGUINAL HERNIA REPAIR WITH MESH;  Surgeon: Jimmye Norman, MD;  Location: [facility] OR;  Service: General;  Laterality: Bilateral;   INSERTION OF MESH Bilateral 01/23/2018   Procedure: INSERTION OF MESH;  Surgeon: Jimmye Norman, MD;  Location: [facility] OR;  Service: General;  Laterality: Bilateral;   IR FLUORO GUIDE CV LINE RIGHT  08/15/2023   IR PATIENT EVAL TECH 0-60 MINS  11/18/2023   IR PERC CHOLECYSTOSTOMY  10/06/2023   IR US GUIDE BX ASP/DRAIN  10/06/2023   IR US GUIDE VASC ACCESS RIGHT  08/15/2023   VASECTOMY     ~1985   Social History:  reports that he has never smoked. He has never used smokeless tobacco. He reports that he does not drink alcohol and does not use drugs.  Family / Support Systems Marital Status: Married Patient Roles: Spouse, Parent Spouse/Significant Other: Wife in fairly good health has declined with all of his health issues-can be there with but not assist Children: [name]-daughter [phone]  Son stays with during the week and with his girlfriend on the weekends-[place] Other Supports: Friends and church members Anticipated Caregiver: Daughter and son along with wife Ability/Limitations of Caregiver: Daughter works nights and son works during the day-hope pt can be somewhat independent at [employer] Caregiver Availability: 24/7 (Work together on this) Family  Dynamics: Close knit family who will pull together to assist Dad and make sure Mom has her needs met also. Pt has been through it since 12/204 healthwise  Social History Preferred language: English Religion: Baptist Cultural Background: NA Education: HS Health Literacy - How often do you need to have someone help you when you read instructions, pamphlets, or other written material from your doctor or pharmacy?: Never Writes: Yes Employment Status: Retired Marine scientist Issues: No issues Guardian/Conservator: None-according to MD pt is capable of making his own decisions while here   Abuse/Neglect Abuse/Neglect Assessment Can Be Completed: Yes Physical Abuse: Denies Verbal Abuse: Denies Sexual Abuse: Denies Exploitation of patient/patient's resources: Denies Self-Neglect: Denies  Patient response to: Social Isolation - How often do you feel lonely or isolated from those around you?: Never  Emotional Status Pt's affect, behavior and adjustment status: Pt is motivated to recover and get stronger and do as much as he can for himself. He is not one to rely upon others and wants to be there for his wife and not burden thier children. Recent Psychosocial Issues: other health issues started in 09/2023 with renal issues Psychiatric History: No issues has been through a lot since december will ask neuro-psych to see while here Substance Abuse History: NA  Patient / Family Perceptions, Expectations & Goals Pt/Family understanding of illness & functional limitations: Pt can explain his renal issues and stroke, he reports he was home two weeks from the SNF when started declining. He hopes he can recover and stay at home and not  come back into the hospital. Premorbid pt/family roles/activities: husband, father, retiree, neighbor, church member, etc Anticipated changes in roles/activities/participation: resume Pt/family expectations/goals: Pt states: "  I want to do well and get back  home with my wife and stay there. "  [employer]: Other (Comment) (OP-HD-[facility]) Premorbid Home Care/DME Agencies: Other (Comment) ([facility] and [facility] active with) Transportation available at discharge: daughter Is the patient able to respond to transportation needs?: Yes In the past 12 months, has lack of transportation kept you from medical appointments or from getting medications?: No In the past 12 months, has lack of transportation kept you from meetings, work, or from getting things needed for daily living?: No Resource referrals recommended: Neuropsychology  Discharge Planning Living Arrangements: Spouse/significant other Support Systems: Spouse/significant other, Children, Friends/neighbors, Church/faith community Type of Residence: Private residence Insurance Resources: Electrical engineer Resources: Social Security, Family Support Financial Screen Referred: No Living Expenses: Own Money Management: Patient, Spouse Does the patient have any problems obtaining your medications?: No Home Management: wife Patient/Family Preliminary Plans: Return home with wife and daughter has been staying there she does live one block away from them. Son stays during the week and is at girlfriend son the weekends. Daughter works nights and son works days, so will work on Higher education careers adviser. Wife is there but can not provide assist. Care Coordinator Barriers to Discharge: Insurance for SNF coverage Care Coordinator Anticipated Follow Up Needs: HH/OP  Clinical Impression Pleasant gentleman who is motivated to recover and get back home. He was in a SNF for two months and was home two weeks then re-admitted to the hospital after health decline-CVA. Will work on discharge needs and have neuro-psych see while here.   Lucy Chris 12/23/2023, 2:10 PM

## 2023-12-23 NOTE — Progress Notes (Signed)
 Inpatient Rehab Admissions Coordinator:    I have a CIR bed for this Pt. RN may call report to [phone].   Pt. To admit to CIR for estimated 12-14 days with the goal of discharging home with assistance from his daughter.   Megan Salon, MS, CCC-SLP Rehab Admissions Coordinator  [phone] (celll) [phone] (office)

## 2023-12-23 NOTE — TOC Transition Note (Signed)
 Transition of Care [facility]) - Discharge Note   Patient Details  Name: Philip Benjamin MRN: [id number] Date of Birth: 1951/04/24  Transition of Care [facility]) CM/SW Contact:  Leitha Bleak, RN Phone Number: 12/23/2023, 11:13 AM   Clinical Narrative:   Patient discharging to CIR. They have arranged [facility] for transport. RN updated.   Final next level of care: IP Rehab Facility Barriers to Discharge: Barriers Resolved  Patient Goals and CMS Choice Patient states their goals for this hospitalization and ongoing recovery are:: CIR CMS Medicare.gov Compare Post Acute Care list provided to:: Patient Represenative (must comment) Choice offered to / list presented to : Spouse [place] ownership interest in [facility].provided to:: Patient   Discharge Placement              Patient to be transferred to facility by: [facility]   Patient and family notified of of transfer: 12/23/23  Discharge Plan and Services Additional resources added to the After Visit Summary for      Social Drivers of Health (SDOH) Interventions SDOH Screenings   Food Insecurity: No Food Insecurity (12/18/2023)  Housing: Low Risk  (12/18/2023)  Transportation Needs: No Transportation Needs (12/18/2023)  Utilities: Not At Risk (12/18/2023)  Social Connections: Moderately Integrated (12/18/2023)  Tobacco Use: Low Risk  (12/18/2023)  Health Literacy: Medium Risk (10/17/2023)   Received from [facility]     Readmission Risk Interventions    12/19/2023    1:50 PM 10/01/2023    7:48 AM 08/22/2023   12:48 PM  Readmission Risk Prevention Plan  Transportation Screening Complete Complete Complete  PCP or Specialist Appt within 3-5 Days Not Complete    Home Care Screening   Complete  Medication Review (RN CM)   Complete  HRI or Home Care Consult Complete Complete   Social Work Consult for Recovery Care Planning/Counseling Complete Complete   Palliative Care Screening Not Applicable Not Applicable    Medication Review Oceanographer) Complete Complete

## 2023-12-24 DIAGNOSIS — I63512 Cerebral infarction due to unspecified occlusion or stenosis of left middle cerebral artery: Secondary | ICD-10-CM | POA: Diagnosis not present

## 2023-12-24 LAB — GLUCOSE, CAPILLARY
Glucose-Capillary: 213 mg/dL — ABNORMAL HIGH (ref 70–99)
Glucose-Capillary: 146 mg/dL — ABNORMAL HIGH (ref 70–99)

## 2023-12-24 MED ORDER — DEXAMETHASONE 4 MG PO TABS
4.0000 mg | ORAL_TABLET | Freq: Every day | ORAL | Status: AC
  Administered 2023-12-26 – 2023-12-30 (×5): 4 mg via ORAL
  Filled 2023-12-24 (×5): qty 1

## 2023-12-24 MED ORDER — VITAMIN D 25 MCG (1000 UNIT) PO TABS
1000.0000 [IU] | ORAL_TABLET | Freq: Every day | ORAL | Status: DC
  Administered 2023-12-24 – 2024-01-05 (×12): 1000 [IU] via ORAL
  Filled 2023-12-24 (×13): qty 1

## 2023-12-24 MED ORDER — DEXAMETHASONE 4 MG PO TABS
4.0000 mg | ORAL_TABLET | Freq: Two times a day (BID) | ORAL | Status: AC
  Administered 2023-12-24 – 2023-12-25 (×4): 4 mg via ORAL
  Filled 2023-12-24 (×4): qty 1

## 2023-12-24 NOTE — Progress Notes (Signed)
   12/24/23 1909  Vitals  Temp 98.2 F (36.8 C)  Pulse Rate (!) 58  Resp 18  BP (!) 159/81  SpO2 100 %  O2 Device Room Air  Weight 85.8 kg  Type of Weight Post-Dialysis  Post Treatment  Dialyzer Clearance Lightly streaked  Hemodialysis Intake (mL) 0 mL  Liters Processed 84  Fluid Removed (mL) 1000 mL  Tolerated HD Treatment Yes   Received patient in bed to unit.  Alert and oriented.  Informed consent signed and in chart.   TX duration:3.5hrs  Patient tolerated well.  Transported back to the room  Alert, without acute distress.  Hand-off given to patient's nurse.   Access used: [facility] Access issues: none  Total UF removed: 1L Medication(s) given: none    Philip Benjamin [facility]

## 2023-12-24 NOTE — Evaluation (Signed)
 Occupational Therapy Assessment and Plan  Patient Details  Name: Philip Benjamin MRN: [id number] Date of Birth: August 20, 1951  OT Diagnosis: abnormal posture, blindness and low vision, hemiplegia affecting dominant side, and muscle weakness (generalized) Rehab Potential: Rehab Potential (ACUTE ONLY): Good ELOS: 12-14 days   Today's Date: 12/24/2023 OT Individual Time: 2536-6440  and 3474-2595 OT Individual Time Calculation (min): 73 min and 30 min  Hospital Problem: Principal Problem:   Left middle cerebral artery stroke [facility])   Past Medical History:  Past Medical History:  Diagnosis Date   BPH (benign prostatic hyperplasia)    Chronic kidney disease    ckd stage 3   Diabetes mellitus without complication (HCC)    Hypertension    Past Surgical History:  Past Surgical History:  Procedure Laterality Date   EYE SURGERY     INGUINAL HERNIA REPAIR Bilateral 01/23/2018   Procedure: BILATERAL OPEN INGUINAL HERNIA REPAIR WITH MESH;  Surgeon: Jimmye Norman, MD;  Location: [facility] OR;  Service: General;  Laterality: Bilateral;   INSERTION OF MESH Bilateral 01/23/2018   Procedure: INSERTION OF MESH;  Surgeon: Jimmye Norman, MD;  Location: [facility] OR;  Service: General;  Laterality: Bilateral;   IR FLUORO GUIDE CV LINE RIGHT  08/15/2023   IR PATIENT EVAL TECH 0-60 MINS  11/18/2023   IR PERC CHOLECYSTOSTOMY  10/06/2023   IR US GUIDE BX ASP/DRAIN  10/06/2023   IR US GUIDE VASC ACCESS RIGHT  08/15/2023   VASECTOMY     ~1985    Assessment & Plan Clinical Impression: Philip Benjamin is a 73 year old right-handed male with history significant for type 2 diabetes mellitus, anemia of chronic disease, BPH, hypertension, hyperlipidemia, end-stage renal disease with hemodialysis Monday Wednesday and Friday, glaucoma of the left eye/legally blind.  Per chart review patient lives with spouse and children.  Two-level home bed and bath main level and 5 steps to entry.  Household ambulator with rolling walker.   Daughter and brother can provide assistance on discharge.  Presented to [facility] 12/18/2023 with right side weakness x 48 hours as well as diarrhea.  Patient with recent admission 09/29/2023 - 10/10/2023 secondary to general malaise, nausea and weakness with nonproductive cough and was discharged to skilled nursing facility ambulating min mod assist 30 feet.  At that time workup demonstrated positive COVID-19 with mild dehydration and hypokalemia.  Patient also with recent admission 08/21/2023 - 08/25/2023 treated for sepsis secondary to Pseudomonas UTI and lobar pneumonia.  He was treated with IV cefepime and discharged with Cipro.  During patient's latest admission noted acute cholecystitis concern for abscess CT of abdomen showed distended gallbladder with pericholecystic stranding underwent cholecystostomy tube placement with drainage of abscess 12/16 completing course of Zosyn and was discharged for short time to skilled nursing facility before returning home.  Upon present admission of 12/18/2023 due to right sided weakness MRI of the brain showed a 3 mm acute infarct within the left lentiform nucleus.  Background parenchymal atrophy, advanced chronic small vessel ischemic disease and chronic infarcts.  Several nonspecific chronic microhemorrhages scattered within the supratentorial and infratentorial brain.  Carotid ultrasound without significant plaque formation.  MRA showed no evidence of intracranial large vessel occlusion or stenosis.  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  No findings to suggest acute disc herniation.  Multilevel foraminal stenosis greatest and severe on the left at C4-5 and C5-6.  Echocardiogram with ejection fraction  of 60 to 65%.  The left ventricle showing no regional wall motion abnormalities.  Admission chemistries SARS coronavirus negative, BUN 20, creatinine 3.02,  hemoglobin 10, hemoglobin A1c 5.6.  Neurology follow-up maintained on low-dose aspirin.  Subcutaneous heparin initiated for DVT prophylaxis.  Renal service follow-up with hemodialysis as directed.  Dr. Wynetta Emery of neurosurgery consulted in regards to findings of severe spinal canal stenosis cord compression C3-4 placed on trial of oral steroids and close follow-up with possible need for surgical intervention.  Patient is tolerating a regular consistency diet.  Therapy evaluations completed due to patient decreased functional mobility and right-sided weakness was admitted for a comprehensive rehab program.Patient transferred to [facility] on 12/23/2023 .    Patient currently requires max with basic self-care skills secondary to muscle weakness and muscle joint tightness, abnormal tone, unbalanced muscle activation, and decreased coordination, decreased visual acuity, and decreased sitting balance, decreased standing balance, decreased postural control, hemiplegia, and decreased balance strategies.  Prior to hospitalization, patient could complete BADL with min.  Patient will benefit from skilled intervention to decrease level of assist with basic self-care skills and increase independence with basic self-care skills prior to discharge home with care partner.  Anticipate patient will require minimal physical assistance and follow up home health and follow up outpatient.  OT - End of Session Activity Tolerance: Decreased this session Endurance Deficit: Yes OT Assessment Rehab Potential (ACUTE ONLY): Good OT Patient demonstrates impairments in the following area(s): Balance;Motor;Sensory;Vision;Edema;Endurance OT Basic ADL's Functional Problem(s): Eating;Grooming;Bathing;Dressing;Toileting OT Transfers Functional Problem(s): Toilet OT Additional Impairment(s): Fuctional Use of Upper Extremity OT Plan OT Frequency: 5 out of 7 days OT Duration/Estimated Length of Stay: 12-14 days OT Treatment/Interventions:  Balance/vestibular training;Disease mangement/prevention;Neuromuscular re-education;Patient/family education;Self Care/advanced ADL retraining;Splinting/orthotics;Therapeutic Exercise;UE/LE Coordination activities;Visual/perceptual remediation/compensation;UE/LE Strength taining/ROM;Therapeutic Activities;Functional mobility training;DME/adaptive equipment instruction;Discharge planning OT Self Feeding Anticipated Outcome(s): Set up OT Basic Self-Care Anticipated Outcome(s): Min assist OT Toileting Anticipated Outcome(s): Min assist OT Bathroom Transfers Anticipated Outcome(s): Mod I (Stand pivot to toilet) OT Recommendation Patient destination: Home Follow Up Recommendations: Outpatient OT Equipment Recommended: To be determined   OT Evaluation Precautions/Restrictions  Precautions Precautions: Fall Precaution/Restrictions Comments: R hemi Restrictions Weight Bearing Restrictions Per Provider Order: No   Pain  No pain Home Living/Prior Functioning Home Living Living Arrangements: Spouse/significant other Available Help at Discharge: Family, Available PRN/intermittently Type of Home: House Home Access: Stairs to enter [employer] of Steps: 2 to get in then 5-7 to get to top lrevel - split level home Entrance Stairs-Rails: Can reach both Home Layout: Two level, Laundry or work area in basement, Able to live on main level with bedroom/bathroom, Full bath on main level Alternate Level Stairs-Number of Steps: 5 Alternate Level Stairs-Rails: Right Bathroom Shower/Tub: Tub/shower unit, Development worker, community Accessibility: Yes Additional Comments: Does not currently shower due to dialysis port  Lives With: Daughter, Spouse IADL History Homemaking Responsibilities: No Current License: No Occupation: Retired IADL Comments: significant glaucome - no longer drives Prior Function Level of Independence: Requires assistive device for independence, Needs  assistance with homemaking Dressing: Minimal  Able to Take Stairs?: Yes Driving: No Vocation: Retired Administrator, sports Baseline Vision/History: 1 Wears Programmer, systems blind Ability to See in Adequate Light: 3 Highly impaired Patient Visual Report: No change from baseline Vision Assessment?: Yes Eye Alignment: Within Functional Limits Tracking/Visual Pursuits: Able to track stimulus in all quads without difficulty Visual Fields: No apparent deficits Additional Comments: Glaucoma bilaterally - more probleatic with HD Perception  Perception: Within Functional Limits Praxis  Praxis: WFL Cognition Cognition Overall Cognitive Status: Within Functional Limits for tasks assessed Arousal/Alertness: Awake/alert Memory: Impaired Memory Impairment: Decreased short term memory Decreased Short Term Memory: Functional basic Attention: Selective Selective Attention: Appears intact Awareness: Appears intact Problem Solving: Appears intact (cautious) Executive Function: Landscape architect: Impaired Organizing Impairment: Functional basic Safety/Judgment: Appears intact Brief Interview for Mental Status (BIMS) Repetition of Three Words (First Attempt): 3 Temporal Orientation: Year: Correct Temporal Orientation: Month: Accurate within 5 days Temporal Orientation: Day: Correct Recall: "Sock": Yes, no cue required Recall: "Blue": Yes, no cue required Recall: "Bed": Yes, no cue required BIMS Summary Score: 15 Sensation Sensation Light Touch: Impaired by gross assessment Hot/Cold: Not tested Proprioception: Impaired by gross assessment Stereognosis: Not tested Additional Comments: RUE more impaired than LUE, mild report of tingling bilaterally prior to this hospitalization - now more pronounced RUE Coordination Gross Motor Movements are Fluid and Coordinated: No Fine Motor Movements are Fluid and Coordinated: No Coordination and Movement Description: R hemi with balance deficits,  Left shoulder unable to sustain testing position Finger Nose Finger Test: Dysmetric mvement bilaterally - undershooting - more pronounced RUE Motor  Motor Motor: Hemiplegia;Abnormal tone;Abnormal postural alignment and control;Motor impersistence  Trunk/Postural Assessment  Cervical Assessment Cervical Assessment: Exceptions to [facility] (Cervical stenosis - limited rotation, forward head) Thoracic Assessment Thoracic Assessment: Exceptions to [facility] (mildly flexed trunk) Lumbar Assessment Lumbar Assessment: Exceptions to [facility] (post tilt in sitting and standing) Postural Control Postural Control: Deficits on evaluation Trunk Control: Poor A/P control Righting Reactions: Delayed and insufficient  Balance Balance Balance Assessed: Yes Standardized Balance Assessment Standardized Balance Assessment: Berg Balance Test Berg Balance Test Sit to Stand: Needs minimal aid to stand or to stabilize Standing Unsupported: Needs several tries to stand 30 seconds unsupported Sitting with Back Unsupported but Feet Supported on Floor or Stool: Able to sit safely and securely 2 minutes Stand to Sit: Needs assistance to sit Transfers: Needs one person to assist Standing Unsupported with Eyes Closed: Needs help to keep from falling Standing Ubsupported with Feet Together: Needs help to attain position and unable to hold for 15 seconds From Standing, Reach Forward with Outstretched Arm: Loses balance while trying/requires external support From Standing Position, Pick up Object from Floor: Unable to try/needs assist to keep balance From Standing Position, Turn to Look Behind Over each Shoulder: Needs assist to keep from losing balance and falling Turn 360 Degrees: Needs assistance while turning Standing Unsupported, Alternately Place Feet on Step/Stool: Needs assistance to keep from falling or unable to try Standing Unsupported, One Foot in Front: Loses balance while stepping or standing Standing on One Leg:  Unable to try or needs assist to prevent fall Total Score: 7 Static Sitting Balance Static Sitting - Balance Support: No upper extremity supported;Feet supported Static Sitting - Level of Assistance: 7: Independent Dynamic Sitting Balance Dynamic Sitting - Balance Support: Feet supported;No upper extremity supported;During functional activity Dynamic Sitting - Level of Assistance: 5: Stand by assistance Static Standing Balance Static Standing - Balance Support: Right upper extremity supported;Left upper extremity supported;During functional activity Static Standing - Level of Assistance: 4: Min assist Dynamic Standing Balance Dynamic Standing - Balance Support: Right upper extremity supported;Left upper extremity supported;During functional activity Dynamic Standing - Level of Assistance: 3: Mod assist Extremity/Trunk Assessment RUE Assessment Active Range of Motion (AROM) Comments: Has ~130* shoulder flexion with excessive thoracic / cervical extension General Strength Comments: 3+/5 LUE Assessment Active Range of Motion (AROM) Comments: Has 120* shoulder flexion with excessive cervical thoracic extension General  Strength Comments: 3+/5  Care Tool Care Tool Self Care Eating   Eating Assist Level: Minimal Assistance - Patient > 75%    Oral Care    Oral Care Assist Level: Minimal Assistance - Patient > 75%    Bathing   Body parts bathed by patient: Right arm;Chest;Abdomen;Front perineal area;Right upper leg;Left upper leg;Face   Body parts n/a: Left arm;Buttocks;Left lower leg;Right lower leg      Upper Body Dressing(including orthotics)   What is the patient wearing?: Pull over shirt   Assist Level: Moderate Assistance - Patient 50 - 74%    Lower Body Dressing (excluding footwear)   What is the patient wearing?: Pants;Underwear/pull up Assist for lower body dressing: Maximal Assistance - Patient 25 - 49%    Putting on/Taking off footwear   What is the patient wearing?:  Ted hose;Shoes Assist for footwear: Total Assistance - Patient < 25%       Care Tool Toileting Toileting activity   Assist for toileting: Maximal Assistance - Patient 25 - 49%     Care Tool Bed Mobility Roll left and right activity   Roll left and right assist level: Minimal Assistance - Patient > 75%    Sit to lying activity   Sit to lying assist level: Minimal Assistance - Patient > 75%    Lying to sitting on side of bed activity   Lying to sitting on side of bed assist level: the ability to move from lying on the back to sitting on the side of the bed with no back support.: Minimal Assistance - Patient > 75%     Care Tool Transfers Sit to stand transfer   Sit to stand assist level: Minimal Assistance - Patient > 75%    Chair/bed transfer   Chair/bed transfer assist level: Minimal Assistance - Patient > 75%     Toilet transfer   Assist Level: Minimal Assistance - Patient > 75%     Care Tool Cognition  Expression of Ideas and Wants Expression of Ideas and Wants: 4. Without difficulty (complex and basic) - expresses complex messages without difficulty and with speech that is clear and easy to understand  Understanding Verbal and Non-Verbal Content Understanding Verbal and Non-Verbal Content: 4. Understands (complex and basic) - clear comprehension without cues or repetitions   Memory/Recall Ability Memory/Recall Ability : Current season;That he or she is in a hospital/hospital unit   Refer to Care Plan for Long Term Goals  SHORT TERM GOAL WEEK 1 OT Short Term Goal 1 (Week 1): Patient will don pull over shirt over head with min assist OT Short Term Goal 2 (Week 1): Patient will don pants over feet with min assist OT Short Term Goal 3 (Week 1): Patient will pull pants up/down over hips with min assist OT Short Term Goal 4 (Week 1): Patient will complete stand step toilet transfer with close supervision OT Short Term Goal 5 (Week 1): Patient will feed himself with  intermittent min assist following set up  Recommendations for other services: None    Skilled Therapeutic Intervention:   1st Session:   Patient received supine in bed - NT attempting to pull scrub shirt over patient's head.  Patient indicating need to void.  Direct handoff from NT to assist patient to sitting then assist to wheelchair - stand pivot and to bathroom - min assist transfer relying on grab bar.  Patient reports weakness in RLE with standing - and can verbalize poor stand balance.  Patient with significant  anterior posterior sway in standing.  Aware, yet unable to self correct.  Question sensation in feet - patient is diabetic and with significant spinal stenosis.  Patient reports daughter assisted him to dress due to limited use of hands, however, patient states really wanting to be as independent as possible.  States his wife has dementia.  Left up in wheelchair with chair pad alarm in place and engaged and call bell/ personal items in reach.   2nd Session:  Patient very fatigued after am therapies.  Coordination, strength, and range of motion testing completed.  Completed BIMS.  Patient with overall R sided weakness, but also presents with significant weakness in left shoulder movement.  Patient does not recall a shoulder injury - but reports it has been weaker for months.  Perhaps related to stenosis.  Will continue to monitor.  Patient issued blue and red cylindrical foam to build up handles of utensils.  Left up in wheelchair with chair pad alarm in place and engaged and call bell/ personal items in reach.    ADL ADL Eating: Minimal assistance Where Assessed-Eating: Chair Grooming: Minimal assistance Where Assessed-Grooming: Sitting at sink Upper Body Bathing: Minimal assistance Where Assessed-Upper Body Bathing: Sitting at sink Lower Body Bathing: Maximal assistance Where Assessed-Lower Body Bathing: Sitting at sink;Standing at sink Upper Body Dressing: Moderate  assistance Where Assessed-Upper Body Dressing: Sitting at sink Lower Body Dressing: Maximal assistance Where Assessed-Lower Body Dressing: Sitting at sink;Standing at sink Toileting: Maximal assistance Where Assessed-Toileting: Teacher, adult education: Moderate assistance Toilet Transfer Method: Stand pivot Toilet Transfer Equipment: Bedside commode;Grab bars Tub/Shower Transfer: Unable to assess Tub/Shower Transfer Method: Unable to assess Film/video editor: Unable to assess Film/video editor Method: Unable to assess ADL Comments: Does not shower due to dialysis port in chest Mobility  Bed Mobility Bed Mobility: Rolling Right;Right Sidelying to Sit Rolling Right: Minimal Assistance - Patient > 75% Right Sidelying to Sit: Moderate Assistance - Patient 50-74% Transfers Sit to Stand: Minimal Assistance - Patient > 75% Stand to Sit: Minimal Assistance - Patient > 75%   Discharge Criteria: Patient will be discharged from OT if patient refuses treatment 3 consecutive times without medical reason, if treatment goals not met, if there is a change in medical status, if patient makes no progress towards goals or if patient is discharged from hospital.  The above assessment, treatment plan, treatment alternatives and goals were discussed and mutually agreed upon: by patient  Collier Salina 12/24/2023, 12:11 PM

## 2023-12-24 NOTE — Evaluation (Signed)
 Physical Therapy Assessment and Plan  Patient Details  Name: Philip Benjamin MRN: [id number] Date of Birth: [birth year]  PT Diagnosis: Abnormal posture, Abnormality of gait, Difficulty walking, Hemiplegia dominant, and Impaired sensation Rehab Potential: Good ELOS: 2 weeks   Today's Date: 12/24/2023 PT Individual Time: 0900-0959 PT Individual Time Calculation (min): 59 min    Hospital Problem: Principal Problem:   Left middle cerebral artery stroke [facility])   Past Medical History:  Past Medical History:  Diagnosis Date   BPH (benign prostatic hyperplasia)    Chronic kidney disease    ckd stage 3   Diabetes mellitus without complication (HCC)    Hypertension    Past Surgical History:  Past Surgical History:  Procedure Laterality Date   EYE SURGERY     INGUINAL HERNIA REPAIR Bilateral 01/23/2018   Procedure: BILATERAL OPEN INGUINAL HERNIA REPAIR WITH MESH;  Surgeon: Jimmye Norman, MD;  Location: [facility] OR;  Service: General;  Laterality: Bilateral;   INSERTION OF MESH Bilateral 01/23/2018   Procedure: INSERTION OF MESH;  Surgeon: Jimmye Norman, MD;  Location: [facility] OR;  Service: General;  Laterality: Bilateral;   IR FLUORO GUIDE CV LINE RIGHT  08/15/2023   IR PATIENT EVAL TECH 0-60 MINS  11/18/2023   IR PERC CHOLECYSTOSTOMY  10/06/2023   IR US GUIDE BX ASP/DRAIN  10/06/2023   IR US GUIDE VASC ACCESS RIGHT  08/15/2023   VASECTOMY     ~1985    Assessment & Plan Clinical Impression: Patient is a 73 year old right-handed male with history significant for type 2 diabetes mellitus, anemia of chronic disease, BPH, hypertension, hyperlipidemia, end-stage renal disease with hemodialysis Monday Wednesday and Friday, glaucoma of the left eye/legally blind. Per chart review patient lives with spouse and children. Two-level home bed and bath main level and 5 steps to entry. Household ambulator with rolling walker. Daughter and brother can provide assistance on discharge. Presented to [facility]  12/18/2023 with right side weakness x 48 hours as well as diarrhea. Patient with recent admission 09/29/2023 - 10/10/2023 secondary to general malaise, nausea and weakness with nonproductive cough and was discharged to skilled nursing facility ambulating min mod assist 30 feet. At that time workup demonstrated positive COVID-19 with mild dehydration and hypokalemia. Patient also with recent admission 08/21/2023 - 08/25/2023 treated for sepsis secondary to Pseudomonas UTI and lobar pneumonia. He was treated with IV cefepime and discharged with Cipro. During patient's latest admission noted acute cholecystitis concern for abscess CT of abdomen showed distended gallbladder with pericholecystic stranding underwent cholecystostomy tube placement with drainage of abscess 12/16 completing course of Zosyn and was discharged for short time to skilled nursing facility before returning home. Upon present admission of 12/18/2023 due to right sided weakness MRI of the brain showed a 3 mm acute infarct within the left lentiform nucleus. Background parenchymal atrophy, advanced chronic small vessel ischemic disease and chronic infarcts. Several nonspecific chronic microhemorrhages scattered within the supratentorial and infratentorial brain. Carotid ultrasound without significant plaque formation. MRA showed no evidence of intracranial large vessel occlusion or stenosis. 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 No findings to suggest acute disc herniation. Multilevel foraminal stenosis greatest and severe on the left at C4-5 and C5-6. Echocardiogram with ejection fraction of 60 to 65%. The left ventricle showing no regional wall motion abnormalities. Admission chemistries SARS coronavirus negative, BUN 20, creatinine 3.02, hemoglobin 10, hemoglobin A1c 5.6. Neurology follow-up maintained on  low-dose aspirin. Subcutaneous heparin  initiated for DVT prophylaxis. Renal service follow-up with hemodialysis as directed. Dr. Wynetta Emery of neurosurgery consulted in regards to findings of severe spinal canal stenosis cord compression C3-4 placed on trial of oral steroids and close follow-up with possible need for surgical intervention. Patient is tolerating a regular consistency diet. Therapy evaluations completed due to patient decreased functional mobility and right-sided weakness was admitted for a comprehensive rehab program.  Patient transferred to [facility] on 12/23/2023 .   Patient currently requires min/mod with mobility secondary to muscle weakness and muscle joint tightness, decreased cardiorespiratoy endurance, impaired timing and sequencing, unbalanced muscle activation, and decreased coordination, and decreased standing balance, decreased postural control, hemiplegia, and decreased balance strategies.  Prior to hospitalization, patient was supervision with mobility and lived with Daughter, Spouse in a House home.  Home access is 2 to get in then 5-7 to get to top lrevel - split level homeStairs to enter.  Patient will benefit from skilled PT intervention to maximize safe functional mobility, minimize fall risk, and decrease caregiver burden for planned discharge home with 24 hour assist.  Anticipate patient will benefit from follow up [facility] at discharge.  PT - End of Session Activity Tolerance: Tolerates 10 - 20 min activity with multiple rests Endurance Deficit: Yes PT Assessment Rehab Potential (ACUTE/IP ONLY): Good PT Barriers to Discharge: Decreased caregiver support;Home environment access/layout;Insurance for SNF coverage;Lack of/limited family support PT Patient demonstrates impairments in the following area(s): Balance;Safety;Endurance;Motor;Sensory PT Transfers Functional Problem(s): Bed Mobility;Bed to Chair;Car PT Locomotion Functional Problem(s): Ambulation;Stairs;Wheelchair Mobility PT Plan PT Intensity: Minimum of 1-2 x/day  ,45 to 90 minutes PT Frequency: 5 out of 7 days PT Duration Estimated Length of Stay: 2 weeks PT Treatment/Interventions: Ambulation/gait training;Balance/vestibular training;Cognitive remediation/compensation;Community reintegration;Discharge planning;Disease management/prevention;Functional electrical stimulation;Neuromuscular re-education;Patient/family education;Skin care/wound management;Stair training;Therapeutic Exercise;UE/LE Coordination activities;Wheelchair propulsion/positioning;UE/LE Strength taining/ROM;Visual/perceptual remediation/compensation;Therapeutic Activities;Splinting/orthotics;Psychosocial support;Pain management;Functional mobility training;DME/adaptive equipment instruction PT Transfers Anticipated Outcome(s): supervision PT Locomotion Anticipated Outcome(s): CGA PT Recommendation Recommendations for Other Services: Neuropsych consult;Therapeutic Recreation consult Therapeutic Recreation Interventions: Stress management Follow Up Recommendations: Home health PT;24 hour supervision/assistance Patient destination: Home Equipment Recommended: To be determined   PT Evaluation Precautions/Restrictions Precautions Precautions: Fall Precaution/Restrictions Comments: R hemi Restrictions Weight Bearing Restrictions Per Provider Order: No Other Position/Activity Restrictions: r hemiparesis Pain Pain Assessment Pain Score: 0-No pain Pain Interference Pain Interference Pain Effect on Sleep: 0. Does not apply - I have not had any pain or hurting in the past 5 days Pain Interference with Therapy Activities: 0. Does not apply - I have not received rehabilitationtherapy in the past 5 days Pain Interference with Day-to-Day Activities: 1. Rarely or not at all Home Living/Prior Functioning Home Living Available Help at Discharge: Family;Available PRN/intermittently Type of Home: House Home Access: Stairs to enter [employer] of Steps: 2 to get in then 5-7 to get  to top lrevel - split level home Entrance Stairs-Rails: Can reach both Home Layout: Two level;Laundry or work area in basement;Able to live on main level with bedroom/bathroom;Full bath on main level Alternate Level Stairs-Number of Steps: 5 Alternate Level Stairs-Rails: Right Bathroom Shower/Tub: Tub/shower unit;Curtain Bathroom Toilet: Standard Bathroom Accessibility: Yes Additional Comments: Does not currently shower due to dialysis port  Lives With: Daughter;Spouse Prior Function Level of Independence: Requires assistive device for independence;Needs assistance with homemaking Dressing: Minimal  Able to Take Stairs?: Yes Driving: No Vocation: Retired Vision/Perception  Vision - History Ability to See in Adequate Light: 3 Highly impaired Vision - Assessment Tracking/Visual Pursuits: Able to track  stimulus in all quads without difficulty Perception Perception: Within Functional Limits Praxis Praxis: WFL  Cognition Overall Cognitive Status: Within Functional Limits for tasks assessed Arousal/Alertness: Awake/alert Orientation Level: Oriented X4 Memory: Appears intact Awareness: Appears intact Problem Solving: Appears intact Safety/Judgment: Appears intact Sensation Sensation Light Touch: Impaired by gross assessment Hot/Cold: Appears Intact Proprioception: Impaired by gross assessment Stereognosis: Not tested Coordination Gross Motor Movements are Fluid and Coordinated: No Coordination and Movement Description: R hemi with balance deficits Motor  Motor Motor: Hemiplegia;Abnormal tone;Abnormal postural alignment and control;Motor impersistence   Trunk/Postural Assessment  Cervical Assessment Cervical Assessment: Exceptions to [facility] (decreased active rotation bilaterally. Slight forward head) Thoracic Assessment Thoracic Assessment: Exceptions to [facility] (rounding of shoulders) Lumbar Assessment Lumbar Assessment: Exceptions to [facility] (post tilt in standing) Postural  Control Postural Control: Deficits on evaluation Trunk Control: Poor A/P control Righting Reactions: Delayed and insufficient  Balance Balance Balance Assessed: Yes Standardized Balance Assessment Standardized Balance Assessment: Berg Balance Test Berg Balance Test Sit to Stand: Needs minimal aid to stand or to stabilize Standing Unsupported: Needs several tries to stand 30 seconds unsupported Sitting with Back Unsupported but Feet Supported on Floor or Stool: Able to sit safely and securely 2 minutes Stand to Sit: Needs assistance to sit Transfers: Needs one person to assist Standing Unsupported with Eyes Closed: Needs help to keep from falling Standing Ubsupported with Feet Together: Needs help to attain position and unable to hold for 15 seconds From Standing, Reach Forward with Outstretched Arm: Loses balance while trying/requires external support From Standing Position, Pick up Object from Floor: Unable to try/needs assist to keep balance From Standing Position, Turn to Look Behind Over each Shoulder: Needs assist to keep from losing balance and falling Turn 360 Degrees: Needs assistance while turning Standing Unsupported, Alternately Place Feet on Step/Stool: Needs assistance to keep from falling or unable to try Standing Unsupported, One Foot in Front: Loses balance while stepping or standing Standing on One Leg: Unable to try or needs assist to prevent fall Total Score: 7 Static Sitting Balance Static Sitting - Balance Support: No upper extremity supported;Feet supported Static Sitting - Level of Assistance: 7: Independent Dynamic Sitting Balance Dynamic Sitting - Balance Support: Feet supported;No upper extremity supported;During functional activity Dynamic Sitting - Level of Assistance: 5: Stand by assistance Static Standing Balance Static Standing - Balance Support: No upper extremity supported Static Standing - Level of Assistance: 4: Min assist Dynamic Standing  Balance Dynamic Standing - Balance Support: Bilateral upper extremity supported;During functional activity Dynamic Standing - Level of Assistance: 3: Mod assist Extremity Assessment           Care Tool Care Tool Bed Mobility Roll left and right activity   Roll left and right assist level: Minimal Assistance - Patient > 75%    Sit to lying activity   Sit to lying assist level: Minimal Assistance - Patient > 75%    Lying to sitting on side of bed activity   Lying to sitting on side of bed assist level: the ability to move from lying on the back to sitting on the side of the bed with no back support.: Minimal Assistance - Patient > 75%     Care Tool Transfers Sit to stand transfer   Sit to stand assist level: Minimal Assistance - Patient > 75%    Chair/bed transfer   Chair/bed transfer assist level: Minimal Assistance - Patient > 75%    Car transfer Car transfer activity did not occur: Safety/medical concerns  Care Tool Locomotion Ambulation   Assist level: Moderate Assistance - Patient 50 - 74% Assistive device: Walker-rolling Max distance: 23ft  Walk 10 feet activity   Assist level: Moderate Assistance - Patient - 50 - 74% Assistive device: Walker-rolling   Walk 50 feet with 2 turns activity Walk 50 feet with 2 turns activity did not occur: Safety/medical concerns      Walk 150 feet activity Walk 150 feet activity did not occur: Safety/medical concerns      Walk 10 feet on uneven surfaces activity Walk 10 feet on uneven surfaces activity did not occur: Safety/medical concerns      Stairs Stair activity did not occur: Safety/medical concerns        Walk up/down 1 step activity Walk up/down 1 step or curb (drop down) activity did not occur: Safety/medical concerns      Walk up/down 4 steps activity Walk up/down 4 steps activity did not occur: Safety/medical concerns      Walk up/down 12 steps activity Walk up/down 12 steps activity did not occur:  Safety/medical concerns      Pick up small objects from floor Pick up small object from the floor (from standing position) activity did not occur: Safety/medical concerns      Wheelchair Is the patient using a wheelchair?: Yes Type of Wheelchair: Manual   Wheelchair assist level: Supervision/Verbal cueing Max wheelchair distance: 150'  Wheel 50 feet with 2 turns activity   Assist Level: Supervision/Verbal cueing  Wheel 150 feet activity   Assist Level: Supervision/Verbal cueing    Refer to Care Plan for Long Term Goals  SHORT TERM GOAL WEEK 1 PT Short Term Goal 1 (Week 1): Pt will complete bed mobility with minA without hospial bed features PT Short Term Goal 2 (Week 1): Pt will complete bed<>chair transfers with CGA and LRAD PT Short Term Goal 3 (Week 1): Pt will ambulate 78ft with minA and LRAD PT Short Term Goal 4 (Week 1): Pt will initiate stair training PT Short Term Goal 5 (Week 1): Pt will improve BERG balance score by at least 8 points  Recommendations for other services: Neuropsych and Therapeutic Recreation  Stress management  Skilled Therapeutic Intervention Mobility Transfers Transfers: Sit to Stand;Stand to Sit;Stand Pivot Transfers Sit to Stand: Minimal Assistance - Patient > 75% Stand to Sit: Minimal Assistance - Patient > 75% Stand Pivot Transfers: Minimal Assistance - Patient > 75% Stand Pivot Transfer Details: Tactile cues for weight shifting;Verbal cues for technique;Verbal cues for precautions/safety;Verbal cues for safe use of DME/AE;Verbal cues for gait pattern;Manual facilitation for weight shifting Transfer (Assistive device): Rolling walker Locomotion  Gait Ambulation: Yes Gait Assistance: Moderate Assistance - Patient 50-74% Gait Distance (Feet): 20 Feet Assistive device: Rolling walker Gait Assistance Details: Tactile cues for weight shifting;Tactile cues for posture;Verbal cues for precautions/safety;Verbal cues for safe use of DME/AE;Verbal cues  for gait pattern;Manual facilitation for weight shifting;Visual cues for safe use of DME/AE;Verbal cues for sequencing;Verbal cues for technique;Manual facilitation for weight bearing;Manual facilitation for placement Gait Gait: Yes Gait Pattern: Impaired Gait Pattern: Step-through pattern;Decreased stride length;Decreased dorsiflexion - right;Decreased weight shift to left;Poor foot clearance - right;Narrow base of support;Trunk flexed;Right genu recurvatum;Left genu recurvatum Stairs / Additional Locomotion Stairs: No  Skilled treatment: Pt sitting in wheelchair to start - agreeable to PT evaluation. He is pleasant and oriented x4 - has no complaints of pain. Able to provide PLOF and social factors however somewhat unclear on true baseline 2/2 gradual decline in mobility since 07/2023. Initiated  functional mobility as outlined above. Overall, requires minA for sit<>stands, minA for stand pivots using RW, and modA for short distance gait with a RW. Pt presents with generalized weakness and debility, R sided weakness, impaired activity tolerance, decreased standing balance (BERG 7/56. Pt is also limited by his cervical stenosis resulting in increased spasticity and UE impairments.   Instructed pt in results of PT evaluation as detailed above, PT POC, rehab potential, rehab goals, and discharge recommendations. Additionally discussed CIR's policies regarding fall safety and use of chair alarm and/or quick release belt. Pt verbalized understanding and in agreement. Will update pt's family members as they become available.   Discharge Criteria: Patient will be discharged from PT if patient refuses treatment 3 consecutive times without medical reason, if treatment goals not met, if there is a change in medical status, if patient makes no progress towards goals or if patient is discharged from hospital.  The above assessment, treatment plan, treatment alternatives and goals were discussed and mutually  agreed upon: by patient  [name]  PT, DPT, CSRS  12/24/2023, 10:04 AM

## 2023-12-24 NOTE — Progress Notes (Signed)
 Pt receives out-pt HD at [facility] on MWF. Will assist as needed.   Olivia Canter Renal Navigator [phone]

## 2023-12-24 NOTE — Progress Notes (Signed)
 Physical Therapy Session Note  Patient Details  Name: Philip Benjamin MRN: [id number] Date of Birth: [birth year]  Today's Date: 12/24/2023 PT Individual Time: 1132-1157 PT Individual Time Calculation (min): 25 min   Short Term Goals: Week 1:  PT Short Term Goal 1 (Week 1): Pt will complete bed mobility with minA without hospial bed features PT Short Term Goal 2 (Week 1): Pt will complete bed<>chair transfers with CGA and LRAD PT Short Term Goal 3 (Week 1): Pt will ambulate 40ft with minA and LRAD PT Short Term Goal 4 (Week 1): Pt will initiate stair training PT Short Term Goal 5 (Week 1): Pt will improve BERG balance score by at least 8 points  Skilled Therapeutic Interventions/Progress Updates:  Pt presents sleeping in manual wheelchair - awakens easily to voice. Has no c/o pain. Initially asks to lie down at end of session but later reports he would rather stay sitting up.   Transported at wheelchair level to day room rehab gym. Completed stand pivot transfer with minA from w/c to [facility], relying on UE support for balance and stability as he pivots to sit. Completed x6 minutes at L15cm/sec with VC for full AROM bilaterally and increasing cadence to challenge cardiovascular endurance.   Short distance gait training completed x46ft total with minA for 32ft and modA for the last 12ft of ambulation. Assist needed for RW management, trunk stability, and advancing RLE in swing for the last 54ft. B knee hyperextension during terminal stance. Relies heavily on RW support with forward flexed trunk.   Ended treatment in room with all his needs met. Call bell within reach.   Therapy Documentation Precautions:  Precautions Precautions: Fall Precaution/Restrictions Comments: R hemi Restrictions Weight Bearing Restrictions Per Provider Order: No Other Position/Activity Restrictions: r hemiparesis General:     Therapy/Group: Individual Therapy  [name] 12/24/2023, 10:49 AM

## 2023-12-24 NOTE — Progress Notes (Signed)
 PROGRESS NOTE   Subjective/Complaints: Labs reviewed and stable Cr much improved! Working with therapy in gym No issues overnight  ROS: +malaise   Objective:   No results found. Recent Labs    12/23/23 0348 12/23/23 1225  WBC 7.9 8.0  HGB 9.1* 9.9*  HCT 27.8* 29.7*  PLT 148* 158   Recent Labs    12/22/23 1118 12/23/23 0348 12/23/23 1225  NA 130* 135  --   K 3.9 3.7  --   CL 97* 101  --   CO2 24 26  --   GLUCOSE 276* 130*  --   BUN 50* 34*  --   CREATININE 2.78* 1.87* 2.31*  CALCIUM 8.6* 8.5*  --     Intake/Output Summary (Last 24 hours) at 12/24/2023 1054 Last data filed at 12/24/2023 0900 Gross per 24 hour  Intake 873 ml  Output 725 ml  Net 148 ml        Physical Exam: Vital Signs Blood pressure 119/75, pulse 76, temperature 97.9 F (36.6 C), resp. rate 16, height 6\' 1"  (1.854 m), weight 85.5 kg, SpO2 100%. Gen: no distress, normal appearing HEENT: oral mucosa pink and moist, NCAT Cardio: Reg rate Chest: normal effort, normal rate of breathing Abd: soft, non-distended Ext: no edema Psych: pleasant, normal affect Skin: intact Musculoskeletal:     Cervical back: Neck supple.     Comments: RUE- biceps 4+/5, Triceps 4+/5; WE 5-/5; Grip 4/5; FA 4-/5 LUE- Biceps, triceps 5-/5; WE 5-/5; Grip 4+/5; FA 4/5 RLE- HF 4-/5; otherwise 4/5 rest of RLE LLE- 4+/5 throughout  Skin:    General: Skin is warm and dry.     Comments: R chest HD catheter- [place] on coccyx  Neurological:     Mental Status: He is oriented to person, place, and time.     Comments: Patient is alert.  Answers basic questions.  Limited medical historian.  Follows simple commands MAS of 2 in RLE- few beats clonus B/L LE's and MAS of 1+ in LLE (+) hoffman's B/L 3+ DTRs B/L patella  Psychiatric:        Mood and Affect: Mood normal.        Behavior: Behavior normal.     Assessment/Plan: 1. Functional deficits which require 3+  hours per day of interdisciplinary therapy in a comprehensive inpatient rehab setting. Physiatrist is providing close team supervision and 24 hour management of active medical problems listed below. Physiatrist and rehab team continue to assess barriers to discharge/monitor patient progress toward functional and medical goals  Care Tool:  Bathing              Bathing assist       Upper Body Dressing/Undressing Upper body dressing        Upper body assist      Lower Body Dressing/Undressing Lower body dressing            Lower body assist       Toileting Toileting    Toileting assist       Transfers Chair/bed transfer  Transfers assist     Chair/bed transfer assist level: Minimal Assistance - Patient > 75%     Locomotion Ambulation  Ambulation assist      Assist level: Moderate Assistance - Patient 50 - 74% Assistive device: Walker-rolling Max distance: 61ft   Walk 10 feet activity   Assist     Assist level: Moderate Assistance - Patient - 50 - 74% Assistive device: Walker-rolling   Walk 50 feet activity   Assist Walk 50 feet with 2 turns activity did not occur: Safety/medical concerns         Walk 150 feet activity   Assist Walk 150 feet activity did not occur: Safety/medical concerns         Walk 10 feet on uneven surface  activity   Assist Walk 10 feet on uneven surfaces activity did not occur: Safety/medical concerns         Wheelchair     Assist Is the patient using a wheelchair?: Yes Type of Wheelchair: Manual    Wheelchair assist level: Supervision/Verbal cueing Max wheelchair distance: 150'    Wheelchair 50 feet with 2 turns activity    Assist        Assist Level: Supervision/Verbal cueing   Wheelchair 150 feet activity     Assist      Assist Level: Supervision/Verbal cueing   Blood pressure 119/75, pulse 76, temperature 97.9 F (36.6 C), resp. rate 16, height 6\' 1"  (1.854 m),  weight 85.5 kg, SpO2 100%.  Medical Problem List and Plan: 1. 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  Antithrombotics: -DVT/anticoagulation:  Pharmaceutical: Heparin             -antiplatelet therapy: Aspirin 81 mg daily 3. Pain Management: Tylenol as needed 4. Mood/Behavior/Sleep: Prozac 20 mg daily, Xanax 0.25 mg 3 times daily as needed             -antipsychotic agents: N/A 5. Neuropsych/cognition: This patient is capable of making decisions on his own behalf. 6. Skin/Wound Care: Routine skin checks 7. Fluids/Electrolytes/Nutrition: Routine in and outs with follow-up chemistries 8.  End-stage renal disease.  Hemodialysis per renal services 9.  Type 2 diabetes mellitus, hemoglobin A1c 5.6.  Semglee 35 units daily. D/c feeding supplement  10.  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  Discussed with Dr. Wynetta Emery neurosurgery placed on trial of steroids.  No current intervention at this time.  Needs to f/u with NSU after d/c.   11.  Anemia of chronic disease.  Follow-up CBC  12.  Glaucoma.  Legally blind left eye.  Continue eyedrops  13.  BPH.  Continue Flomax 0.4 mg daily  14.  Hyperlipidemia.  Continue Lipitor  15.  Hypertension.  Monitor with increased mobility.  16.  Recent cholecystitis.  Status post cholecystostomy tube placement drainage of abscess 12/16.  Follow-up outpatient  17.  Acute diarrhea.  LBM yesterday-had 5 Bms the day prior.    18. Spasticity- pt would benefit with Spasticity meds- will try Zanaflex 2 mg BID prn. Since cannot do Baclofen.      LOS: 1 days A FACE TO FACE EVALUATION WAS PERFORMED  Drema Pry Treyvin Glidden 12/24/2023, 10:54 AM

## 2023-12-24 NOTE — Progress Notes (Signed)
 [facility] Progress Note   Subjective: Seen in room. Having lunch. No complaints. For dialysis today.   Objective Vitals:   12/23/23 2028 12/24/23 0610 12/24/23 0616 12/24/23 0750  BP: (!) 142/71 (!) 147/77  119/75  Pulse: (!) 59 61  76  Resp: 16 16    Temp: 98.7 F (37.1 C) 97.9 F (36.6 C)    TempSrc:      SpO2: 100% 100%    Weight:   85.5 kg   Height:         Additional Objective Labs: Basic Metabolic Panel: Recent Labs  Lab 12/20/23 0930 12/21/23 1331 12/22/23 1118 12/23/23 0348 12/23/23 1225  NA 133* 129* 130* 135  --   K 3.7 4.1 3.9 3.7  --   CL 97* 95* 97* 101  --   CO2 25 22 24 26   --   GLUCOSE 284* 349* 276* 130*  --   BUN 21 37* 50* 34*  --   CREATININE 2.44* 2.74* 2.78* 1.87* 2.31*  CALCIUM 8.7* 8.8* 8.6* 8.5*  --   PHOS 2.7  --   --   --   --    CBC: Recent Labs  Lab 12/18/23 0922 12/20/23 0930 12/22/23 1146 12/23/23 0348 12/23/23 1225  WBC 5.6 7.0 8.5 7.9 8.0  HGB 10.0* 9.0* 8.9* 9.1* 9.9*  HCT 31.8* 28.5* 27.4* 27.8* 29.7*  MCV 96.4 95.0 93.2 92.7 93.1  PLT 129* 131* 148* 148* 158   Blood Culture    Component Value Date/Time   SDES  10/06/2023 1532    BILE Performed at [facility], [address], [place], [place] [address]    [facility]  10/06/2023 1532    Normal Performed at [facility], [address] [place], [place] [address]    CULT  10/06/2023 1532    No growth aerobically or anaerobically. Performed at [facility], [address] [place], [place] [address]    REPTSTATUS 10/11/2023 FINAL 10/06/2023 1532     Physical Exam General: Alert in WC, nad Heart: RRR Lungs: Clear, normal wob Abdomen: soft non-tender Extremities: no LE edema  Dialysis Access: [facility]   Medications:   aspirin  81 mg Oral Daily   atorvastatin  40 mg Oral Daily   Chlorhexidine Gluconate Cloth  6 each Topical BID   cholecalciferol  1,000 Units Oral Daily   cycloSPORINE  1 drop Both Eyes BID   dexamethasone  4 mg Oral  Q12H   Followed by   Melene Muller ON 12/26/2023] dexamethasone  4 mg Oral Daily   dorzolamide  1 drop Both Eyes BID   And   timolol  1 drop Both Eyes BID   FLUoxetine  20 mg Oral Daily   heparin  5,000 Units Subcutaneous Q8H   insulin aspart  0-20 Units Subcutaneous TID WC   insulin glargine  35 Units Subcutaneous Daily   latanoprost  1 drop Left Eye QHS   multivitamin  1 tablet Oral QHS   tamsulosin  0.4 mg Oral Q supper    [facility], MWF. 4 hours. EDW 86.5 kg. TDC  Flow rates: 350/500. 2K, 2.5Ca. Heparin: 1000 units/hr. Meds: Mircera 60 mcg every 4 weeks (last dose 12/08/23), Venofer 50mg  every Monday.   Assessment/Plan: ESRD - HD per MWF schedule. HD today  CVA with a 3 mm acute infarct within the left lentiform nucleus neurology. HTN/volume - acceptable control.  Anemia of ESRD - on ESA - aranesp 60 mcg q 4 week. Last dose  2/17.   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 Phos at goal. H/O Cholecystitis - S/p Surgery last admission with IR placed cholecystostomy tube and drain on 10/06/23 which has since been removed. 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  Neurosurgery made aware and recommended close follow up and surgery in the future.  Trial of steroids for now  Disposition - Now in CIR.     [name] PA-C [facility] 12/24/2023,2:27 PM

## 2023-12-24 NOTE — Progress Notes (Signed)
 Inpatient Rehabilitation  Patient information reviewed and entered into eRehab system by [facility]. Karen Kays., CCC/SLP, PPS Coordinator.  Information including medical coding, functional ability and quality indicators will be reviewed and updated through discharge.

## 2023-12-24 NOTE — Patient Care Conference (Signed)
 Inpatient RehabilitationTeam Conference and Plan of Care Update Date: 12/24/2023   Time: 11:14 AM    Patient Name: Philip Benjamin      Medical Record Number: [id number]  Date of Birth: 03/31/1951 Sex: Male         Room/Bed: 4W02C/4W02C-01 Payor Info: Payor: MEDICARE / Plan: MEDICARE PART A AND B / Product Type: *No Product type* /    Admit Date/Time:  12/23/2023 11:57 AM  Primary Diagnosis:  Left middle cerebral artery stroke [facility])  Hospital Problems: Principal Problem:   Left middle cerebral artery stroke [facility])    Expected Discharge Date: Expected Discharge Date: 01/08/24  Team Members Present: Physician leading conference: Dr. Sula Soda Social Worker Present: Dossie Der, LCSW Nurse Present: Chana Bode, RN PT Present: Wynelle Link, PT OT Present: Bretta Bang, OT SLP Present: Jeannie Done, SLP PPS Coordinator present : Fae Pippin, SLP     Current Status/Progress Goal Weekly Team Focus  Bowel/Bladder   Pt is continent of bowel/bladder   Pt to remain continent of bowel/bladder   Will assess qshift and PRN    Swallow/Nutrition/ Hydration   eval pending           ADL's   Max assist BADL - limited by decreased vision, R weakness, limited functional use of BUE   Min assist   improve functional use of RUE, Improve stand balance/ tolerance.    Mobility   minA sit<>stand, minA stand pivot with RW, modA gait ~15-42ft with RW, supervision for wheelchair mobility   CGA/supervision  standing balance, gait training, general endurance training, stair training    Communication   eval pending            Safety/Cognition/ Behavioral Observations  eval pending            Pain   Pt denies pain   Pt will continue to deny pain   Will assess qshift and PRN    Skin   Pt's skin is intact   Pt's skin will remain intact  Will maintain skin integrity      Discharge Planning:  New evaluation-home with wife who can not assist and daughter works  nights and son works days. Between all can provide care. Await team's goals   Team Discussion: Patient post left MCA CVA with right sided weakness. Limited by cervical stenosis, poor static balance issues and right leg drag.  Patient on target to meet rehab goals: yes, currently at baseline for ADLs.  Needs min assist for sit - stand. Needs min - mod assist for ambulation using a RW.  Goals for discharge set for min assist overall.  *See Care Plan and progress notes for long and short-term goals.   Revisions to Treatment Plan:  Nutritional supplements discontinued   Teaching Needs: Safety, medication, transfers, toileting, etc.   Current Barriers to Discharge: Decreased caregiver support and Home enviroment access/layout  Possible Resolutions to Barriers: Family education     Medical Summary Current Status: left MCA infarct, ESRD, hyponatremia, type 2 diabetes  Barriers to Discharge: Medical stability  Barriers to Discharge Comments: left MCA infarct, ESRD, hyponatremia, type 2 diabetes Possible Resolutions to [employer] Focus: continue aspirin and atorvastatin, D3 started, continue to monitor creatinine, continue to monitor sodium, d/c feeding supplement, continue 35 U of semglee   Continued Need for Acute Rehabilitation Level of Care: The patient requires daily medical management by a physician with specialized training in physical medicine and rehabilitation for the following reasons: Direction of a multidisciplinary physical  rehabilitation program to maximize functional independence : Yes Medical management of patient stability for increased activity during participation in an intensive rehabilitation regime.: Yes Analysis of laboratory values and/or radiology reports with any subsequent need for medication adjustment and/or medical intervention. : Yes   I attest that I was present, lead the team conference, and concur with the assessment and plan of the team.   Chana Bode B 12/24/2023, 3:06 PM

## 2023-12-24 NOTE — Plan of Care (Signed)
  Problem: RH Balance Goal: LTG: Patient will maintain dynamic sitting balance (OT) Description: LTG:  Patient will maintain dynamic sitting balance with assistance during activities of daily living (OT) Flowsheets (Taken 12/24/2023 1228) LTG: Pt will maintain dynamic sitting balance during ADLs with: Independent   Problem: Sit to Stand Goal: LTG:  Patient will perform sit to stand in prep for activites of daily living with assistance level (OT) Description: LTG:  Patient will perform sit to stand in prep for activites of daily living with assistance level (OT) Flowsheets (Taken 12/24/2023 1228) LTG: PT will perform sit to stand in prep for activites of daily living with assistance level: Independent with assistive device   Problem: RH Eating Goal: LTG Patient will perform eating w/assist, cues/equip (OT) Description: LTG: Patient will perform eating with assist, with/without cues using equipment (OT) Flowsheets (Taken 12/24/2023 1228) LTG: Pt will perform eating with assistance level of: Independent with assistive device    Problem: RH Grooming Goal: LTG Patient will perform grooming w/assist,cues/equip (OT) Description: LTG: Patient will perform grooming with assist, with/without cues using equipment (OT) Flowsheets (Taken 12/24/2023 1228) LTG: Pt will perform grooming with assistance level of: Independent with assistive device    Problem: RH Bathing Goal: LTG Patient will bathe all body parts with assist levels (OT) Description: LTG: Patient will bathe all body parts with assist levels (OT) Flowsheets (Taken 12/24/2023 1228) LTG: Pt will perform bathing with assistance level/cueing: Independent with assistive device  LTG: Position pt will perform bathing: At sink   Problem: RH Dressing Goal: LTG Patient will perform upper body dressing (OT) Description: LTG Patient will perform upper body dressing with assist, with/without cues (OT). Flowsheets (Taken 12/24/2023 1228) LTG: Pt will perform  upper body dressing with assistance level of: Independent with assistive device Goal: LTG Patient will perform lower body dressing w/assist (OT) Description: LTG: Patient will perform lower body dressing with assist, with/without cues in positioning using equipment (OT) Flowsheets (Taken 12/24/2023 1228) LTG: Pt will perform lower body dressing with assistance level of: Minimal Assistance - Patient > 75%   Problem: RH Toileting Goal: LTG Patient will perform toileting task (3/3 steps) with assistance level (OT) Description: LTG: Patient will perform toileting task (3/3 steps) with assistance level (OT)  Flowsheets (Taken 12/24/2023 1228) LTG: Pt will perform toileting task (3/3 steps) with assistance level: Minimal Assistance - Patient > 75%   Problem: RH Toilet Transfers Goal: LTG Patient will perform toilet transfers w/assist (OT) Description: LTG: Patient will perform toilet transfers with assist, with/without cues using equipment (OT) Flowsheets (Taken 12/24/2023 1228) LTG: Pt will perform toilet transfers with assistance level of: Independent with assistive device

## 2023-12-24 NOTE — Discharge Summary (Signed)
 Physician Discharge Summary  Patient ID: Philip Benjamin MRN: [id number] DOB/AGE: 1951-01-26 73 y.o.  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 Anemia of chronic disease Glaucoma BPH//urinary retention/UTI Hyperlipidemia Hypertension Recent cholecystitis Acute diarrhea/resolved Mood stabilization   Discharged Condition: Stable  Significant Diagnostic Studies: ECHOCARDIOGRAM COMPLETE Result Date: 12/19/2023    ECHOCARDIOGRAM REPORT   Patient Name:   Philip Benjamin Date of Exam: 12/19/2023 Medical Rec #:  [id number]        Height:       73.0 in Accession #:    [id number]       Weight:       190.0 lb Date of Birth:  Mar 15, 1951       BSA:          2.105 m Patient Age:    72 years         BP:           132/66 mmHg Patient Gender: M                HR:           62 bpm. [name] Procedure: 2D Echo, Cardiac Doppler, Color Doppler and Intracardiac            Opacification Agent (Both Spectral and Color Flow Doppler were            utilized during procedure). Indications:    Stroke I63.9  History:        Patient has no prior history of Echocardiogram examinations.                 Risk Factors:Hypertension, Diabetes and Dyslipidemia. ESRD (end                 stage renal disease) on dialysis [facility]),                 Acute stroke due to ischemia [facility]).  Sonographer:    Celesta Gentile RCS Referring Phys: [phone] SEYED A SHAHMEHDI IMPRESSIONS  1. Left ventricular ejection fraction, by estimation, is 60 to 65%. The left ventricle has normal function. The left ventricle has no regional wall motion abnormalities. There is mild left ventricular hypertrophy. Left ventricular diastolic parameters were normal.  2. Right ventricular systolic function is normal. The  right ventricular size is normal. Tricuspid regurgitation signal is inadequate for assessing PA pressure.  3. Left atrial size was moderately dilated.  4. The mitral valve is normal in structure. Trivial mitral valve regurgitation. No evidence of mitral stenosis.  5. The aortic valve is tricuspid. Aortic valve regurgitation is not visualized. No aortic stenosis is present.  6. The inferior vena cava is normal in size with greater than 50% respiratory variability, suggesting right atrial pressure of 3 mmHg. FINDINGS  Left Ventricle: Left ventricular ejection fraction, by estimation, is 60 to 65%. The left ventricle has normal function. The left ventricle has no regional wall motion abnormalities. Definity contrast agent was given IV to delineate the left ventricular  endocardial borders. Strain imaging was not performed. The left ventricular internal cavity size was normal in size. There is mild left ventricular hypertrophy. Left ventricular diastolic parameters were normal. Right Ventricle: The right ventricular size is normal. Right vetricular wall thickness was not well visualized. Right ventricular systolic function  is normal. Tricuspid regurgitation signal is inadequate for assessing PA pressure. Left Atrium: Left atrial size was moderately dilated. Right Atrium: Right atrial size was normal in size. Pericardium: There is no evidence of pericardial effusion. Mitral Valve: The mitral valve is normal in structure. Trivial mitral valve regurgitation. No evidence of mitral valve stenosis. Tricuspid Valve: The tricuspid valve is normal in structure. Tricuspid valve regurgitation is not demonstrated. No evidence of tricuspid stenosis. Aortic Valve: The aortic valve is tricuspid. Aortic valve regurgitation is not visualized. No aortic stenosis is present. Aortic valve mean gradient measures 6.0 mmHg. Aortic valve peak gradient measures 10.1 mmHg. Aortic valve area, by VTI measures 3.29  cm. Pulmonic Valve: The  pulmonic valve was not well visualized. Pulmonic valve regurgitation is not visualized. No evidence of pulmonic stenosis. Aorta: The aortic root is normal in size and structure. Venous: The inferior vena cava is normal in size with greater than 50% respiratory variability, suggesting right atrial pressure of 3 mmHg. IAS/Shunts: No atrial level shunt detected by color flow Doppler. Additional Comments: 3D imaging was not performed.  LEFT VENTRICLE PLAX 2D LVIDd:         4.50 cm   Diastology LVIDs:         2.60 cm   LV e' medial:    7.07 cm/s LV PW:         1.20 cm   LV E/e' medial:  13.7 LV IVS:        1.10 cm   LV e' lateral:   10.00 cm/s LVOT diam:     2.20 cm   LV E/e' lateral: 9.7 LV SV:         141 LV SV Index:   67 LVOT Area:     3.80 cm  RIGHT VENTRICLE RV S prime:     13.30 cm/s TAPSE (M-mode): 1.9 cm LEFT ATRIUM             Index        RIGHT ATRIUM           Index LA diam:        3.50 cm 1.66 cm/m   RA Area:     17.70 cm LA Vol (A2C):   83.6 ml 39.71 ml/m  RA Volume:   46.30 ml  21.99 ml/m LA Vol (A4C):   93.1 ml 44.22 ml/m LA Biplane Vol: 92.3 ml 43.84 ml/m  AORTIC VALVE AV Area (Vmax):    3.56 cm AV Area (Vmean):   3.16 cm AV Area (VTI):     3.29 cm AV Vmax:           159.00 cm/s AV Vmean:          112.000 cm/s AV VTI:            0.430 m AV Peak Grad:      10.1 mmHg AV Mean Grad:      6.0 mmHg LVOT Vmax:         149.00 cm/s LVOT Vmean:        93.000 cm/s LVOT VTI:          0.372 m LVOT/AV VTI ratio: 0.87  AORTA Ao Root diam: 3.40 cm MITRAL VALVE MV Area (PHT): 3.31 cm    SHUNTS MV Decel Time: 229 msec    Systemic VTI:  0.37 m MV E velocity: 96.80 cm/s  Systemic Diam: 2.20 cm MV A velocity: 90.80 cm/s MV E/A ratio:  1.07 Dina Rich MD Electronically signed by Christiane Ha  Branch MD Signature Date/Time: 12/19/2023/1:28:40 PM    Final    US Carotid Bilateral (at [facility] and [facility] only) Result Date: 12/18/2023 CLINICAL DATA:  73 year old male with history of stroke. EXAM: BILATERAL CAROTID DUPLEX  ULTRASOUND TECHNIQUE: Wallace Cullens scale imaging, color Doppler and duplex ultrasound were performed of bilateral carotid and vertebral arteries in the neck. COMPARISON:  None Available. FINDINGS: Criteria: Quantification of carotid stenosis is based on velocity parameters that correlate the residual internal carotid diameter with NASCET-based stenosis levels, using the diameter of the distal internal carotid lumen as the denominator for stenosis measurement. The following velocity measurements were obtained: RIGHT ICA: Peak systolic velocity 76 cm/sec, End diastolic velocity 21 cm/sec CCA: Peak systolic velocity 102 cm/sec SYSTOLIC ICA/CCA RATIO:  0.7 ECA: Peak systolic velocity 88 cm/sec LEFT ICA: Peak systolic velocity 86 cm/sec, End diastolic velocity 23 cm/sec CCA: 113 cm/sec SYSTOLIC ICA/CCA RATIO:  0.8 ECA: 84 cm/sec RIGHT CAROTID ARTERY: Minimal atherosclerotic plaque formation about the bulb and bifurcation. No significant tortuosity. Normal low resistance waveforms. RIGHT VERTEBRAL ARTERY:  Antegrade flow. LEFT CAROTID ARTERY: Minimal atherosclerotic plaque formation about the bulb and bifurcation. No significant tortuosity. Normal low resistance waveforms. LEFT VERTEBRAL ARTERY:  Antegrade flow. Upper extremity non-invasive blood pressures: Not obtained. IMPRESSION: 1. Right carotid artery system: Patent without significant atherosclerotic plaque formation. 2. Left carotid artery system: Patent without significant atherosclerotic plaque formation. 3.  Vertebral artery system: Patent with antegrade flow bilaterally. Marliss Coots, MD [facility] Radiology Electronically Signed   By: Marliss Coots M.D.   On: 12/18/2023 16:16   MR CERVICAL SPINE WO CONTRAST Result Date: 12/18/2023 CLINICAL DATA:  Cervical spinal stenosis. EXAM: MRI CERVICAL SPINE WITHOUT CONTRAST TECHNIQUE: Multiplanar, multisequence MR imaging of the cervical spine was performed. No intravenous  contrast was administered. COMPARISON:  None Available. FINDINGS: Alignment: Cervical lordosis is maintained. There is mild dextrocurvature of the cervical spine. No significant listhesis. Vertebrae: There is partial fusion of the C2 and C3 vertebral bodies. Associated signal abnormality at this level likely reflecting Modic type 2 degenerative endplate changes. Additional Modic type 2 degenerative endplate changes at C6-7. No bone marrow edema in the vertebral bodies or evidence of acute fracture. Cord: Cord compression noted at the C3-4 level with subtle increased STIR signal which may reflect myelomalacia versus edema. Cord signal otherwise unremarkable. Posterior Fossa, vertebral arteries, paraspinal tissues: The visualized posterior fossa structures are unremarkable. There is prevertebral edema from the C3-4 level to the upper thoracic spine possibly related to prominent endplate osteophytes. No findings to suggest anterior longitudinal ligament disruption. There is additional edema within the paraspinal musculature of the mid and lower cervical spine more pronounced on the right. Edema involves the interspinous ligaments from C3-4 to C7-T1 suggesting ligament strain. 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 C2-3: Fusion at this level. No significant spinal canal stenosis. Bilateral facet arthrosis. Mild bilateral foraminal stenosis. C3-4: Disc osteophyte complex which abuts the ventral cervical cord. Thickening of the ligamentum flavum abuts the dorsal cord. Severe spinal canal stenosis and cord compression at this level. Uncovertebral hypertrophy and facet arthrosis. Moderate to severe bilateral foraminal stenosis. C4-5: Mild disc height loss. Disc osteophyte complex slightly eccentric to the left contacts the ventral cervical cord. No cord signal abnormality. Thickening of the ligamentum flavum. Moderate spinal canal stenosis. Bilateral facet  arthrosis and uncovertebral hypertrophy. Moderate foraminal narrowing on the right. Severe foraminal narrowing on the left with some contribution from the  disc osteophyte complex. C5-6: Disc height loss. Disc osteophyte complex abuts the ventral cord. Thickening of the ligamentum flavum abuts the dorsal cord on the right. Moderate spinal canal stenosis. Subtle cord compression without cord signal abnormality. Bilateral facet arthrosis and uncovertebral hypertrophy. Moderate to severe right and severe left foraminal stenosis. C6-7: Disc height loss. Disc osteophyte complex abuts the ventral cord. Thickening of the ligamentum flavum abuts the dorsal cord. Moderate spinal canal stenosis. Bilateral uncovertebral hypertrophy and facet arthrosis resulting in moderate to severe bilateral foraminal stenosis. C7-T1: Disc bulge which indents the ventral thecal sac without contacting the spinal cord. Thickening of the ligamentum flavum abuts the dorsal thecal sac more pronounced on the left. Mild-to-moderate spinal canal stenosis. Bilateral facet arthrosis. Mild-to-moderate right and moderate left foraminal stenosis. IMPRESSION: 1. 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 Findings are likely chronic and degenerative in etiology. No findings to suggest acute disc herniation. 2. 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 Moderate spinal canal stenosis at C4-5 through C6-7. 3. Multilevel foraminal stenosis, greatest and severe on the left at C4-5 and C5-6. Additional moderate to severe foraminal stenosis at multiple levels. 4. Prevertebral edema from C3-4 to the upper thoracic spine possibly related to prominent endplate osteophytes. No evidence of anterior longitudinal ligament disruption. 5. Edema of interspinous ligaments at multiple levels suggesting ligament strain. Electronically Signed   By: Emily Filbert M.D.    On: 12/18/2023 15:26   MR ANGIO HEAD WO CONTRAST Result Date: 12/18/2023 CLINICAL DATA:  Stroke follow-up EXAM: MRA HEAD WITHOUT CONTRAST TECHNIQUE: Angiographic images of the Circle of Willis were acquired using MRA technique without intravenous contrast. COMPARISON:  Same day MRI head.  CT head 05/20/2023. FINDINGS: Anterior circulation: The bilateral intracranial ICAs are patent. There is mild narrowing of the bilateral supraclinoid ICAs slightly greater on the right, likely related to atherosclerosis. The right M1 segment and MCA bifurcation are patent. Distal right MCA branches are patent. The left M1 segment and left MCA bifurcation are patent. Distal left MCA branches are patent. A1 A2 segments are patent bilaterally. Distal ACA branches are patent. Posterior circulation: The visualized intracranial vertebral arteries are patent. Basilar artery is patent. Small bilateral posterior communicating arteries noted. The bilateral PCAs are patent. SCAs and AICAs are visualized bilaterally. Anatomic variants: None Other: None. IMPRESSION: 1. No evidence of intracranial large vessel occlusion or high-grade stenosis. 2. Mild narrowing of the bilateral supraclinoid ICAs slightly greater on the right, likely related to atherosclerosis. Electronically Signed   By: Emily Filbert M.D.   On: 12/18/2023 15:11   DG Chest Port 1 View Result Date: 12/18/2023 CLINICAL DATA:  Weakness. EXAM: PORTABLE CHEST 1 VIEW COMPARISON:  09/29/2023. FINDINGS: Low lung volume. Bilateral lung fields are clear. No acute consolidation or lung collapse. No pulmonary edema. Bilateral costophrenic angles are clear. Stable cardio-mediastinal silhouette. No acute osseous abnormalities. The soft tissues are within normal limits. Right IJ hemodialysis catheter noted with its tip overlying the cavoatrial junction region. IMPRESSION: No active disease. Electronically Signed   By: Jules Schick M.D.   On: 12/18/2023 11:48   MR BRAIN WO  CONTRAST Result Date: 12/18/2023 CLINICAL DATA:  Provided history: Neuro deficit, acute, stroke suspected. EXAM: MRI HEAD WITHOUT CONTRAST TECHNIQUE: Multiplanar, multiecho pulse sequences of the brain and surrounding structures were obtained without intravenous contrast. COMPARISON:  Head CT 05/20/2023. FINDINGS: Brain: Mild-to-moderate generalized cerebral atrophy. Commensurate prominence of the ventricles and sulci. 3 mm  acute infarct within the left lentiform nucleus (series 5, image 26). Chronic lacunar infarcts within the bilateral cerebral hemispheric white matter, within the left thalamus and within the pons. Multifocal T2 FLAIR hyperintense signal abnormality elsewhere within the cerebral white matter (advanced) and pons (moderate), nonspecific but also compatible with chronic small vessel ischemic disease. Several nonspecific chronic microhemorrhages scattered within the supratentorial and infratentorial brain. Small chronic infarct within the left cerebellar hemisphere. No cortical encephalomalacia is identified. No evidence of an intracranial mass. No extra-axial fluid collection. No midline shift. Vascular: Maintained flow voids within the proximal large arterial vessels. Skull and upper cervical spine: No focal worrisome marrow lesion. Sinuses/Orbits: No mass or acute finding within the imaged orbits. Prior bilateral ocular lens replacement. Trace mucosal thickening within the left maxillary sinus. Other: Small-volume fluid within bilateral mastoid air cells. Asymmetric T2 hyperintense signal within the right petrous apex, potentially reflecting trapped fluid. IMPRESSION: 1. 3 mm acute infarct within the left lentiform nucleus. 2. Background parenchymal atrophy, advanced chronic small vessel ischemic disease and chronic infarcts, as detailed. 3. Several nonspecific chronic microhemorrhages scattered within the supratentorial and infratentorial brain. 4. Small-volume fluid within bilateral mastoid air  cells. 5. Asymmetric T2 hyperintense signal within the right petrous apex, potentially reflecting trapped fluid. Electronically Signed   By: Jackey Loge D.O.   On: 12/18/2023 10:28    Labs:  Basic Metabolic Panel: Recent Labs  Lab 12/18/23 0922 12/20/23 0930 12/21/23 1331 12/22/23 1118 12/22/23 1146 12/23/23 0348 12/23/23 1225  NA 137 133* 129* 130*  --  135  --   K 3.5 3.7 4.1 3.9  --  3.7  --   CL 98 97* 95* 97*  --  101  --   CO2 27 25 22 24   --  26  --   GLUCOSE 99 284* 349* 276*  --  130*  --   BUN 20 21 37* 50*  --  34*  --   CREATININE 3.02* 2.44* 2.74* 2.78*  --  1.87* 2.31*  CALCIUM 9.3 8.7* 8.8* 8.6*  --  8.5*  --   MG  --   --  1.8  --  1.8 1.6*  --   PHOS  --  2.7  --   --   --   --   --     CBC: Recent Labs  Lab 12/22/23 1146 12/23/23 0348 12/23/23 1225  WBC 8.5 7.9 8.0  HGB 8.9* 9.1* 9.9*  HCT 27.4* 27.8* 29.7*  MCV 93.2 92.7 93.1  PLT 148* 148* 158    CBG: Recent Labs  Lab 12/22/23 2147 12/23/23 0736 12/23/23 1218 12/23/23 1654 12/23/23 2030  GLUCAP 153* 125* 175* 220* 187*    Brief HPI:   YARETH MACDONNELL is a 73 y.o. right-handed male with history significant for type 2 diabetes mellitus, anemia of chronic disease, BPH hypertension, hyperlipidemia, end-stage renal disease with hemodialysis Monday Wednesday and Friday, glaucoma of the left eye/legally blind.  Per chart review lives with spouse and children.  Two-level home bed and bath main level 5 steps to entry.  Household ambulator with rolling walker.  Daughter and brother plan to provide assistance on discharge.  Presented to [facility] 12/18/2023 with right side weakness x 48 hours as well as diarrhea.  Patient with recent admission 09/29/2023 - 10/10/2023 secondary to generalized malaise nausea and weakness with nonproductive cough and was discharged to skilled nursing facility ambulating min mod assist 30 feet.  At that time workup demonstrated positive  COVID-19 with mild dehydration  and hypokalemia.  Patient also with recent admission 08/21/2023 - 08/25/2023 treated for sepsis secondary to Pseudomonas UTI and lobar pneumonia.  He was treated with IV cefepime discharged with Cipro.  During patient's latest admission noted acute cholecystitis concern for abscess CT of abdomen showed distended gallbladder with pericholecystic stranding underwent cholecystostomy tube placement with drainage of abscess 12/16 completing course of Zosyn was discharged for short time to skilled nursing facility before returning home.  Upon recent admission 12/18/2023 due to right sided weakness MRI of the brain showed a 3 mm acute infarct within the left lentiform.  Background parenchymal atrophy advanced chronic small vessel ischemic disease and chronic infarcts.  Several nonspecific chronic microhemorrhages scattered within the supratentorial and infratentorial brain.  Carotid ultrasound without significant plaque formation.  MRA showed no evidence of intracranial large vessel occlusion or stenosis.  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  No findings to suggest acute disc herniation.  Multilevel foraminal stenosis greatest and severe on the left at C4-5 and C5-6.  Echocardiogram ejection fraction of 60 to 65%.  Left ventricle showing no regional wall motion abnormalities.  Admission chemistry SARS coronavirus negative BUN 20 creatinine 3.02 hemoglobin 10 hemoglobin A1c 5.6.  Neurology follow-up placed on low-dose aspirin.  Subcutaneous heparin for DVT prophylaxis.  Renal service follow-up hemodialysis as directed.  Dr. Wynetta Emery of neurosurgery consulted in regards to findings of severe spinal canal stenosis cord compression C3-4 placed on trial of oral steroids close follow-up and possible need for surgical intervention.  Tolerating a regular diet.  Therapy evaluations completed due to patient decreased functional  mobility was admitted for a comprehensive rehab program   Hospital Course: Philip Benjamin was admitted to rehab 12/23/2023 for inpatient therapies to consist of PT, ST and OT at least three hours five days a week. Past admission physiatrist, therapy team and rehab RN have worked together to provide customized collaborative inpatient rehab.  Pertaining to patient's left lentiform nucleus infarction remained stable maintained on low-dose aspirin.  Subcutaneous heparin for DVT prophylaxis.  Neurology service follow-up as outpatient.  Noted spasticity placed on trial of Zanaflex and monitored.  Mood stabilization with use of Prozac scheduled Xanax as needed emotional support provided.  End-stage renal disease with hemodialysis as advised per renal services.  Blood pressure was soft as Norvasc discontinued placed on ProAmatine.  Blood sugars overall controlled hemoglobin A1c 5.6 maintain on insulin therapy with diabetic teaching.  Hospital course findings of severe spinal canal stenosis and cord compression at C3-4 follow-up Dr. Wynetta Emery neurosurgery trial of oral steroids awaiting plan for possible need for surgical intervention.  Anemia of chronic disease no bleeding episodes and maintained on Aranesp.  Patient did have a history of glaucoma legally blind in the left eye remained on eyedrops as directed.  BPH with Flomax 0.4 mg daily patient with difficulty in voiding with elevated scans of 800 difficult catheterizations urology consulted placement of indwelling Foley tube that would remain in place until follow-up outpatient.Marland Kitchen  His Flomax was discontinued and placed on Ditropan for bladder spasms.  Urine study did show greater than 100,000 Klebsiella completing course of antibiotic care.   Lipitor ongoing for hyperlipidemia.  Recent cholecystitis status post cholecystostomy tube placement drainage of abscess 12/16 follow-up outpatient.   Blood pressures were monitored on TID basis and remained controlled and  monitored  Diabetes has been monitored with ac/hs CBG  checks and SSI was use prn for tighter BS control.    Rehab course: During patient's stay in rehab weekly team conferences were held to monitor patient's progress, set goals and discuss barriers to discharge. At admission, patient required mod to max assist 10 feet rolling walker mod max assist step pivot transfers  Physical exam.  Blood pressure 135/70 pulse 57 temperature 98.7 respirations 13 oxygen saturation is 99% room air Constitutional.  No acute distress HEENT Head.  Normocephalic and atraumatic Eyes.  Pupils round and reactive to light no discharge without nystagmus Neck.  Supple nontender no JVD without thyromegaly Cardiac regular rate and rhythm without any extra sounds or murmur heard Abdomen.  Soft nontender positive bowel sounds without rebound Respiratory effort normal no respiratory distress without wheeze Skin.  Dry and intact.  Right chest tube HD catheter Musculoskeletal Comments.  Right upper extremity biceps 4+/5 triceps 4+/5 wrist extension 5 -/5 grip 4/5 FA 4 -/5 Left upper extremity biceps, triceps 5 -/5 wrist extension 5 -/5 grip 4+/5 FA 4/5 Right lower extremity hip flexors 4 -/5 otherwise 4/5 rest of right lower extremity Left lower extremity 4+/5 throughout Neurologic.  Alert oriented answers basic questions follows commands.  He/She  has had improvement in activity tolerance, balance, postural control as well as ability to compensate for deficits. He/She has had improvement in functional use RUE/LUE  and RLE/LLE as well as improvement in awareness.  Patient currently requires min mod assist for bed mobility, min mod assist for squat pivot versus stand pivot transfers, min/mod assist for short distance ambulation 15 to 20 feet.  Working with energy conservation techniques.  Sitting edge of bed engage patient in light ADLs to don clean brief and pants with assistance required to weave bilateral lower extremities  and multiple stand required to bring pants to waist.  Stand pivot edge of bed to wheelchair using rolling walker minimal assist for balance and rolling walker management.  Completes amatory transfer rolling walker minimal assist for wheelchair to mat table cues.  Full family teaching completed plan discharge to home.       Disposition:  There are no questions and answers to display.         Diet: Renal diet  Special Instructions: No driving smoking or alcohol  Follow-up Dr. Wynetta Emery of neurosurgery in regards to C3-4 spinal stenosis with cord compression  Follow-up outpatient with urology services [phone]  Medications at discharge. 1.  Tylenol as needed 2.  Xanax 0.25 mg 3 times daily as needed 3.  Aspirin 81 mg p.o. daily 4.  Lipitor 40 mg p.o. daily 5.Restasis 0.05% 1 drop both eyes twice daily 6.  Trusopt ophthalmic solution/Timoptic 1 drop both eyes twice daily 7.  Prozac 20 mg p.o. daily 8.  Lantus insulin 19 units daily 9.Xalatan ophthalmic solution 0.005% 1 drop left eye at bedtime 10.  Rena-Vite 1 tablet nightly 11.  Imodium 2 mg as needed diarrhea or loose stools 12.  Zanaflex 2 mg twice daily muscle spasms 13.ProAmatine 5 mg Monday Wednesday Friday dialysis 14.  Vitamin D3 3000 units daily 15.  Ditropan 2.5 mg every 8 hours as needed bladder spasms And maintained on Aranesp   30-35 minutes were spent completing discharge summary and discharge planning  Discharge Instructions     Ambulatory referral to Neurology   Complete by: As directed    An appointment is requested in approximately: 4 weeks left MCA infarction        Follow-up Information  Horton Chin, MD Follow up.   Specialty: Physical Medicine and Rehabilitation Why: Office to call for appointment Contact information: [address] [place] [address] [phone]         Ethelene Hal, MD Follow up.   Specialties: Nephrology, Vascular Surgery Why: Call for   appointment Contact information: [address] [place] [address] [phone]                 Signed: Mcarthur Rossetti Marleigh Kaylor 12/24/2023, 4:58 AM

## 2023-12-25 ENCOUNTER — Inpatient Hospital Stay: Payer: Medicare Other | Admitting: Oncology

## 2023-12-25 ENCOUNTER — Inpatient Hospital Stay: Payer: Medicare Other

## 2023-12-25 DIAGNOSIS — I63512 Cerebral infarction due to unspecified occlusion or stenosis of left middle cerebral artery: Secondary | ICD-10-CM | POA: Diagnosis not present

## 2023-12-25 LAB — GLUCOSE, CAPILLARY
Glucose-Capillary: 255 mg/dL — ABNORMAL HIGH (ref 70–99)
Glucose-Capillary: 146 mg/dL — ABNORMAL HIGH (ref 70–99)
Glucose-Capillary: 148 mg/dL — ABNORMAL HIGH (ref 70–99)
Glucose-Capillary: 259 mg/dL — ABNORMAL HIGH (ref 70–99)

## 2023-12-25 MED ORDER — INSULIN GLARGINE 100 UNIT/ML ~~LOC~~ SOLN
36.0000 [IU] | Freq: Every day | SUBCUTANEOUS | Status: DC
  Administered 2023-12-26 – 2023-12-29 (×4): 36 [IU] via SUBCUTANEOUS
  Administered 2023-12-30: 34 [IU] via SUBCUTANEOUS
  Filled 2023-12-25 (×5): qty 0.36

## 2023-12-25 MED ORDER — TIZANIDINE HCL 4 MG PO TABS
2.0000 mg | ORAL_TABLET | Freq: Two times a day (BID) | ORAL | Status: DC
  Administered 2023-12-25 – 2023-12-31 (×12): 2 mg via ORAL
  Filled 2023-12-25 (×12): qty 1

## 2023-12-25 NOTE — Progress Notes (Addendum)
 Occupational Therapy Session Note  Patient Details  Name: Philip Benjamin MRN: [id number] Date of Birth: [birth year]  Today's Date: 12/25/2023 OT Individual Time: 1101-1159 OT Individual Time Calculation (min): 58 min    Short Term Goals: Week 1:  OT Short Term Goal 1 (Week 1): Patient will don pull over shirt over head with min assist OT Short Term Goal 2 (Week 1): Patient will don pants over feet with min assist OT Short Term Goal 3 (Week 1): Patient will pull pants up/down over hips with min assist OT Short Term Goal 4 (Week 1): Patient will complete stand step toilet transfer with close supervision OT Short Term Goal 5 (Week 1): Patient will feed himself with intermittent min assist following set up  Skilled Therapeutic Interventions/Progress Updates:    Patient received seated in wheelchair - states "I feel a little depressed today."  Patient indicates he feels he should be making progress faster.  Provided reassurance that patient is doing well, and he is just at the beginning of his rehab.  Patient bathed and dressed at sink this am.  Improved stand tolerance - almost 5 min, improved stand balance - released UE support for from sink ~ 3 sec- to manage pants with minimal backward sway.  Patient showing improved ability to don pull over shirt following set up, and improved ability to reach to feet to put pants over feet.  Cued patient to dress RLE first for increased independence.   Reinforced improvement with patient.  Patient left up in wheelchair with chair pad alarm in place and engaged and call bell/ personal items in reach.    Therapy Documentation Precautions:  Precautions Precautions: Fall Precaution/Restrictions Comments: R hemi Restrictions Weight Bearing Restrictions Per Provider Order: No Other Position/Activity Restrictions: r hemiparesis  Pain: Pain Assessment Pain Scale: 0-10 Pain Score: 0-No pain       Therapy/Group: Individual Therapy  Collier Salina 12/25/2023, 12:06 PM

## 2023-12-25 NOTE — Progress Notes (Signed)
 Advised by CSW that pt's d/c date is Thursday, March 20. Contacted clinic to be advised of pt's d/c date and resumption date. Will assist as needed.   Olivia Canter Renal Navigator [phone]

## 2023-12-25 NOTE — Progress Notes (Signed)
 [facility] Progress Note   Subjective: Seen in room. No issues with dialysis yesterday. No complaints   Objective Vitals:   12/24/23 1909 12/24/23 1915 12/25/23 0410 12/25/23 0741  BP: (!) 159/81 (!) 140/78 (!) 143/76 137/69  Pulse: (!) 58 64 60 70  Resp: 18 18 16 14   Temp: 98.2 F (36.8 C) 98.2 F (36.8 C) 98.2 F (36.8 C) 98.6 F (37 C)  TempSrc:  Oral Oral Oral  SpO2: 100%   100%  Weight: 85.8 kg     Height:        Additional Objective Labs: Basic Metabolic Panel: Recent Labs  Lab 12/20/23 0930 12/21/23 1331 12/22/23 1118 12/23/23 0348 12/23/23 1225  NA 133* 129* 130* 135  --   K 3.7 4.1 3.9 3.7  --   CL 97* 95* 97* 101  --   CO2 25 22 24 26   --   GLUCOSE 284* 349* 276* 130*  --   BUN 21 37* 50* 34*  --   CREATININE 2.44* 2.74* 2.78* 1.87* 2.31*  CALCIUM 8.7* 8.8* 8.6* 8.5*  --   PHOS 2.7  --   --   --   --    CBC: Recent Labs  Lab 12/20/23 0930 12/22/23 1146 12/23/23 0348 12/23/23 1225  WBC 7.0 8.5 7.9 8.0  HGB 9.0* 8.9* 9.1* 9.9*  HCT 28.5* 27.4* 27.8* 29.7*  MCV 95.0 93.2 92.7 93.1  PLT 131* 148* 148* 158   Blood Culture    Component Value Date/Time   SDES  10/06/2023 1532    BILE Performed at [facility], [address], [place], [place] [address]    [facility]  10/06/2023 1532    Normal Performed at [facility], [address], [place], [place] [address]    CULT  10/06/2023 1532    No growth aerobically or anaerobically. Performed at [facility], [address] [place], [place] [address]    REPTSTATUS 10/11/2023 FINAL 10/06/2023 1532     Physical Exam General: Alert in WC, nad Heart: RRR Lungs: Clear, normal wob Abdomen: soft non-tender Extremities: no LE edema  Dialysis Access: [facility]   Medications:   aspirin  81 mg Oral Daily   atorvastatin  40 mg Oral Daily   Chlorhexidine Gluconate Cloth  6 each Topical BID   cholecalciferol  1,000 Units Oral Daily   cycloSPORINE  1 drop Both Eyes BID    dexamethasone  4 mg Oral Q12H   Followed by   Melene Muller ON 12/26/2023] dexamethasone  4 mg Oral Daily   dorzolamide  1 drop Both Eyes BID   And   timolol  1 drop Both Eyes BID   FLUoxetine  20 mg Oral Daily   heparin  5,000 Units Subcutaneous Q8H   insulin aspart  0-20 Units Subcutaneous TID WC   [START ON 12/26/2023] insulin glargine  36 Units Subcutaneous Daily   latanoprost  1 drop Left Eye QHS   multivitamin  1 tablet Oral QHS   tamsulosin  0.4 mg Oral Q supper    [facility], MWF. 4 hours. EDW 86.5 kg. TDC  Flow rates: 350/500. 2K, 2.5Ca. Heparin: 1000 units/hr. Meds: Mircera 60 mcg every 4 weeks (last dose 12/08/23), Venofer 50mg  every Monday.   Assessment/Plan: ESRD - HD per MWF schedule.  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 HTN/volume - acceptable control.  Anemia of ESRD - Aranesp 60 mcg q 4 week. Last dose 2/17.  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 Phos at goal. H/O Cholecystitis - S/p Surgery last admission with IR placed cholecystostomy tube and drain on 10/06/23 which has since been removed. 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  Neurosurgery made aware and recommended close follow up and surgery in the future.  Trial of steroids for now  Disposition - Now in CIR.     [name] PA-C [facility] 12/25/2023,1:26 PM

## 2023-12-25 NOTE — Plan of Care (Signed)
  Problem: RH Balance Goal: LTG Patient will maintain dynamic standing balance (PT) Description: LTG:  Patient will maintain dynamic standing balance with assistance during mobility activities (PT) Flowsheets (Taken 12/25/2023 0745) LTG: Pt will maintain dynamic standing balance during mobility activities with:: Contact Guard/Touching assist   Problem: Sit to Stand Goal: LTG:  Patient will perform sit to stand with assistance level (PT) Description: LTG:  Patient will perform sit to stand with assistance level (PT) Flowsheets (Taken 12/25/2023 0745) LTG: PT will perform sit to stand in preparation for functional mobility with assistance level: Contact Guard/Touching assist   Problem: RH Bed Mobility Goal: LTG Patient will perform bed mobility with assist (PT) Description: LTG: Patient will perform bed mobility with assistance, with/without cues (PT). Flowsheets (Taken 12/25/2023 0745) LTG: Pt will perform bed mobility with assistance level of: Supervision/Verbal cueing   Problem: RH Bed to Chair Transfers Goal: LTG Patient will perform bed/chair transfers w/assist (PT) Description: LTG: Patient will perform bed to chair transfers with assistance (PT). Flowsheets (Taken 12/25/2023 0745) LTG: Pt will perform Bed to Chair Transfers with assistance level: Contact Guard/Touching assist   Problem: RH Car Transfers Goal: LTG Patient will perform car transfers with assist (PT) Description: LTG: Patient will perform car transfers with assistance (PT). Flowsheets (Taken 12/25/2023 0745) LTG: Pt will perform car transfers with assist:: Minimal Assistance - Patient > 75%   Problem: RH Ambulation Goal: LTG Patient will ambulate in controlled environment (PT) Description: LTG: Patient will ambulate in a controlled environment, # of feet with assistance (PT). Flowsheets (Taken 12/25/2023 0745) LTG: Pt will ambulate in controlled environ  assist needed:: Contact Guard/Touching assist LTG: Ambulation distance  in controlled environment: 45ft Goal: LTG Patient will ambulate in home environment (PT) Description: LTG: Patient will ambulate in home environment, # of feet with assistance (PT). Flowsheets (Taken 12/25/2023 0745) LTG: Pt will ambulate in home environ  assist needed:: Contact Guard/Touching assist LTG: Ambulation distance in home environment: 50ft   Problem: RH Stairs Goal: LTG Patient will ambulate up and down stairs w/assist (PT) Description: LTG: Patient will ambulate up and down # of stairs with assistance (PT) Flowsheets (Taken 12/25/2023 0745) LTG: Pt will ambulate up/down stairs assist needed:: Minimal Assistance - Patient > 75% LTG: Pt will  ambulate up and down number of stairs: 4 with LRAD

## 2023-12-25 NOTE — Progress Notes (Signed)
 Physical Therapy Session Note  Patient Details  Name: Philip Benjamin MRN: [id number] Date of Birth: March 28, 1951  Today's Date: 12/25/2023 PT Individual Time: 1030-1057 + 1400-1515 PT Individual Time Calculation (min): 27 min  + 75 min  Short Term Goals: Week 1:  PT Short Term Goal 1 (Week 1): Pt will complete bed mobility with minA without hospial bed features PT Short Term Goal 2 (Week 1): Pt will complete bed<>chair transfers with CGA and LRAD PT Short Term Goal 3 (Week 1): Pt will ambulate 1ft with minA and LRAD PT Short Term Goal 4 (Week 1): Pt will initiate stair training PT Short Term Goal 5 (Week 1): Pt will improve BERG balance score by at least 8 points  Skilled Therapeutic Interventions/Progress Updates:      1st session: Pt sitting EOB on arrival - pt in a down mood, reports feeling "depressed" and frustrated with his slower progress. He reports seeing other patients moving better than him and is upset he can't get to the same level. Therapeutic use of self and presence for active listening to help boost mood. MD and team made aware of his depression.   Pt requesting to toilet before leaving his room. Stand pivot transfer with minA into the wheelchair and wheeled to the bathroom. Completed additional stand pivot with minA using grab bars with assist needed for managing pants/underwear in standing. Pt continent of bladder void only. Charted.  2nd session: Pt sitting in wheelchair to start - in a better mood compared to this AM. Has no c/o pain .  Transported at wheelchair level to main rehab gym. Sit<>stand to RW at [facility] level. Ambulates with minA for ~35ft and modA for the last ~72ft for a total of 10ft. Increased assist needed due to RLE dragging and having difficult initiating hip/knee flexion to advance foot. Also tends to get RW to far out in front of him resulting in a increased falls risk. Used Blue TB to wrap his RLE for DF and knee flex assist - improved foot clearance  and stride length with the blue TB wrap - able to ambulate ~75ft total with min/modA. Distance continues to be limited by RLE fatigue and weakness. He also has B knee hyperextension during terminal stance. May need to get B AFO's or consult [facility] for recommendations.   Pt assisted onto Nustep with minA stand pivot transfer - completed 10 minutes at L5 resistance using BUE/BLE with PT encouraging for full AROM bilaterally. Achieved a total of 365 steps.   Attempted to complete single leg toe taps to 4" block on RLE with RW support in standing but patient unable to clear due to weak hip flexors. Switched out 4" block for a 2" block - able to complete up to x5 reps before muscle fatigue with minA for balance.   Supine there-ex completed for RLE strengthening and stretching: -passive SLR for hamstring stretching on R -passive hip rotator stretching on R -singe knee to chest on R with emphasis on controlled movement for hip flexor strength -active hip abd/add on R -bridges with blue TB around knees -modified clam shells in supine with blue TB around knees -seated hamstring curls -seated heel raise -seated LAQ + knee extension holds *2x10 reps each. PT providing VC/TC for sequencing and technique.   Returned to his room and patient left sitting upright in w/c. All needs met at end. Chair pad alarm on.   Therapy Documentation Precautions:  Precautions Precautions: Fall Precaution/Restrictions Comments: R hemi Restrictions Weight Bearing  Restrictions Per Provider Order: No Other Position/Activity Restrictions: r hemiparesis General:     Therapy/Group: Individual Therapy  Adeana Grilliot P Jafeth Mustin 12/25/2023, 8:01 AM

## 2023-12-25 NOTE — Evaluation (Signed)
 Speech Language Pathology Assessment and Plan  Patient Details  Name: Philip Benjamin MRN: [id number] Date of Birth: 1951-09-03  SLP Diagnosis:   n/a Rehab Potential:  n/a ELOS:   n/a  Today's Date: 12/25/2023 SLP Individual Time: 8119-1478 SLP Individual Time Calculation (min): 22 min  Hospital Problem: Principal Problem:   Left middle cerebral artery stroke [facility])  Past Medical History:  Past Medical History:  Diagnosis Date   BPH (benign prostatic hyperplasia)    Chronic kidney disease    ckd stage 3   Diabetes mellitus without complication (HCC)    Hypertension    Past Surgical History:  Past Surgical History:  Procedure Laterality Date   EYE SURGERY     INGUINAL HERNIA REPAIR Bilateral 01/23/2018   Procedure: BILATERAL OPEN INGUINAL HERNIA REPAIR WITH MESH;  Surgeon: Jimmye Norman, MD;  Location: [facility] OR;  Service: General;  Laterality: Bilateral;   INSERTION OF MESH Bilateral 01/23/2018   Procedure: INSERTION OF MESH;  Surgeon: Jimmye Norman, MD;  Location: [facility] OR;  Service: General;  Laterality: Bilateral;   IR FLUORO GUIDE CV LINE RIGHT  08/15/2023   IR PATIENT EVAL TECH 0-60 MINS  11/18/2023   IR PERC CHOLECYSTOSTOMY  10/06/2023   IR US GUIDE BX ASP/DRAIN  10/06/2023   IR US GUIDE VASC ACCESS RIGHT  08/15/2023   VASECTOMY     ~1985    Assessment / Plan / Recommendation Clinical Impression  Philip Benjamin is a 73 year old male with history significant for DM2, anemia, BPH, hypertension, hyperlipidemia, end-stage renal disease with hemodialysis, glaucoma of the left eye/legally blind. Presented [facility]18/2023 with right side weakness x 48 hours. Upon admission, MRI of the brain showed a 3 mm acute infarct within the left lentiform nucleus. Background parenchymal atrophy, advanced chronic small vessel ischemic disease and chronic infarcts. Several nonspecific chronic microhemorrhages scattered within the supratentorial and infratentorial brain. MRI of the  cervical spine did show severe spinal canal stenosis and cord compression at C3-4 with increased cord signal, myelomalacia versus edema. Patient is tolerating a regular consistency diet. Therapy evaluations completed due to patient decreased functional mobility and right-sided weakness was admitted for a comprehensive rehab program.   Cognitive-Linguistic Evaluation: Patient presents with mild cognitive deficits in areas of delayed recall, registration, working memory, complex problem solving, and selective attention as evidenced by performance on MoCA Blind (15/22). Patient reports that at baseline, he has noticed some changes to memory but these have not acutely changed. Patient lives with daughter who manages medications, finances, cooking, and appointments. 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 He endorses and utilizes compensatory strategies to improve communication during dysfluencies independently. Expressive/receptive language is [facility]. No further SLP needs indicated. SLP will sign off. Patient was left in lowered bed with call bell in reach and bed alarm set.    Skilled Therapeutic Interventions          SLP conducted skilled evaluation session to assess cognitive-linguistic function utilizing MoCA Blind evaluation and patient interview. Full results above.   SLP Assessment  Patient does not need any further Speech Lanaguage Pathology Services    Recommendations  Patient destination: Home Follow up Recommendations: None Equipment Recommended: None recommended by SLP    SLP Frequency   N/a  SLP Duration  SLP Intensity  SLP Treatment/Interventions  N/a   N/a    N/a   Pain Pain Assessment Pain Scale: 0-10 Pain Score: 0-No pain  Prior Functioning  Cognitive/Linguistic Baseline: Baseline deficits Baseline deficit details: daughter assists with iADLs at baseline Type of Home: House  Lives With: Daughter;Spouse Available Help  at Discharge: Family;Available PRN/intermittently Vocation: Retired  SLP Evaluation Cognition Overall Cognitive Status: History of cognitive impairments - at baseline Arousal/Alertness: Awake/alert Orientation Level: Oriented X4 Year: 2025 Month: March Day of Week: Correct Attention: Selective Selective Attention: Impaired Selective Attention Impairment: Functional complex Memory: Impaired Memory Impairment: Decreased short term memory;Retrieval deficit Decreased Short Term Memory: Functional basic Awareness: Appears intact Problem Solving: Impaired Problem Solving Impairment: Functional complex Executive Function: Organizing;Sequencing Sequencing: Impaired Sequencing Impairment: Functional complex Organizing: Impaired Organizing Impairment: Functional complex;Functional basic Safety/Judgment: Appears intact  Comprehension Auditory Comprehension Overall Auditory Comprehension: Appears within functional limits for tasks assessed Expression Expression Primary Mode of Expression: Verbal Verbal Expression Overall Verbal Expression: Appears within functional limits for tasks assessed Oral Motor Oral Motor/Sensory Function Overall Oral Motor/Sensory Function: Within functional limits Motor Speech Overall Motor Speech: Appears within functional limits for tasks assessed  Care Tool Care Tool Cognition Ability to hear (with hearing aid or hearing appliances if normally used Ability to hear (with hearing aid or hearing appliances if normally used): 0. Adequate - no difficulty in normal conservation, social interaction, listening to TV   Expression of Ideas and Wants Expression of Ideas and Wants: 4. Without difficulty (complex and basic) - expresses complex messages without difficulty and with speech that is clear and easy to understand   Understanding Verbal and Non-Verbal Content Understanding Verbal and Non-Verbal Content: 4. Understands (complex and basic) - clear  comprehension without cues or repetitions  Memory/Recall Ability Memory/Recall Ability : Current season;That he or she is in a hospital/hospital unit   Oral Care Assessment Oral Assessment  (WDL): Within Defined Limits Lips: Symmetrical Teeth: Intact Tongue: Moist;Pink Mucous Membrane(s): Moist;Pink Level of Consciousness: Alert Is patient on any of following O2 devices?: None of the above Nutritional status: No high risk factors Oral Assessment Risk : Low Risk  Recommendations for other services: None   Discharge Criteria: Patient will be discharged from SLP if patient refuses treatment 3 consecutive times without medical reason, if treatment goals not met, if there is a change in medical status, if patient makes no progress towards goals or if patient is discharged from hospital.  The above assessment, treatment plan, treatment alternatives and goals were discussed and mutually agreed upon: by patient  Jeannie Done, M.A., CCC-SLP  Yetta Barre 12/25/2023, 9:51 AM

## 2023-12-25 NOTE — Progress Notes (Signed)
 PROGRESS NOTE   Subjective/Complaints: Patient reported depressed mood to [place] PT today Had no complaints to me this morning Working with SLP  ROS: +malaise, +depression   Objective:   No results found. Recent Labs    12/23/23 0348 12/23/23 1225  WBC 7.9 8.0  HGB 9.1* 9.9*  HCT 27.8* 29.7*  PLT 148* 158   Recent Labs    12/22/23 1118 12/23/23 0348 12/23/23 1225  NA 130* 135  --   K 3.9 3.7  --   CL 97* 101  --   CO2 24 26  --   GLUCOSE 276* 130*  --   BUN 50* 34*  --   CREATININE 2.78* 1.87* 2.31*  CALCIUM 8.6* 8.5*  --     Intake/Output Summary (Last 24 hours) at 12/25/2023 1112 Last data filed at 12/25/2023 0808 Gross per 24 hour  Intake 477 ml  Output 1000 ml  Net -523 ml        Physical Exam: Vital Signs Blood pressure 137/69, pulse 70, temperature 98.6 F (37 C), temperature source Oral, resp. rate 14, height 6\' 1"  (1.854 m), weight 85.8 kg, SpO2 100%. Gen: no distress, normal appearing HEENT: oral mucosa pink and moist, NCAT Cardio: Reg rate Chest: normal effort, normal rate of breathing Abd: soft, non-distended Ext: no edema Psych: pleasant, normal affect Skin: intact Musculoskeletal:     Cervical back: Neck supple.     Comments: RUE- biceps 4+/5, Triceps 4+/5; WE 5-/5; Grip 4/5; FA 4-/5 LUE- Biceps, triceps 5-/5; WE 5-/5; Grip 4+/5; FA 4/5 RLE- HF 4-/5; otherwise 4/5 rest of RLE LLE- 4+/5 throughout , stable 3/6 Skin:    General: Skin is warm and dry.     Comments: R chest HD catheter- [place] on coccyx  Neurological:     Mental Status: He is oriented to person, place, and time.     Comments: Patient is alert.  Answers basic questions.  Limited medical historian.  Follows simple commands MAS of 2 in RLE- few beats clonus B/L LE's and MAS of 1+ in LLE (+) hoffman's B/L 3+ DTRs B/L patella  Psychiatric:        Mood and Affect: Mood normal.        Behavior: Behavior normal.      Assessment/Plan: 1. Functional deficits which require 3+ hours per day of interdisciplinary therapy in a comprehensive inpatient rehab setting. Physiatrist is providing close team supervision and 24 hour management of active medical problems listed below. Physiatrist and rehab team continue to assess barriers to discharge/monitor patient progress toward functional and medical goals  Care Tool:  Bathing    Body parts bathed by patient: Right arm, Chest, Abdomen, Front perineal area, Right upper leg, Left upper leg, Face     Body parts n/a: Left arm, Buttocks, Left lower leg, Right lower leg   Bathing assist       Upper Body Dressing/Undressing Upper body dressing   What is the patient wearing?: Pull over shirt    Upper body assist Assist Level: Moderate Assistance - Patient 50 - 74%    Lower Body Dressing/Undressing Lower body dressing      What is the patient wearing?:  Pants, Underwear/pull up     Lower body assist Assist for lower body dressing: Maximal Assistance - Patient 25 - 49%     Toileting Toileting    Toileting assist Assist for toileting: Maximal Assistance - Patient 25 - 49%     Transfers Chair/bed transfer  Transfers assist     Chair/bed transfer assist level: Minimal Assistance - Patient > 75%     Locomotion Ambulation   Ambulation assist      Assist level: Moderate Assistance - Patient 50 - 74% Assistive device: Walker-rolling Max distance: 48ft   Walk 10 feet activity   Assist     Assist level: Moderate Assistance - Patient - 50 - 74% Assistive device: Walker-rolling   Walk 50 feet activity   Assist Walk 50 feet with 2 turns activity did not occur: Safety/medical concerns         Walk 150 feet activity   Assist Walk 150 feet activity did not occur: Safety/medical concerns         Walk 10 feet on uneven surface  activity   Assist Walk 10 feet on uneven surfaces activity did not occur: Safety/medical  concerns         Wheelchair     Assist Is the patient using a wheelchair?: Yes Type of Wheelchair: Manual    Wheelchair assist level: Supervision/Verbal cueing Max wheelchair distance: 150'    Wheelchair 50 feet with 2 turns activity    Assist        Assist Level: Supervision/Verbal cueing   Wheelchair 150 feet activity     Assist      Assist Level: Supervision/Verbal cueing   Blood pressure 137/69, pulse 70, temperature 98.6 F (37 C), temperature source Oral, resp. rate 14, height 6\' 1"  (1.854 m), weight 85.8 kg, SpO2 100%.  Medical Problem List and Plan: 1. 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  Antithrombotics: -DVT/anticoagulation:  Pharmaceutical: Heparin             -antiplatelet therapy: Aspirin 81 mg daily 3. Pain Management: Tylenol as needed 4. Depression:  Neuropsych consulted. Prozac 20 mg daily, Xanax 0.25 mg 3 times daily as needed             -antipsychotic agents: N/A 5. Neuropsych/cognition: This patient is capable of making decisions on his own behalf. 6. Skin/Wound Care: Routine skin checks 7. Fluids/Electrolytes/Nutrition: Routine in and outs with follow-up chemistries 8.  End-stage renal disease.  Hemodialysis per renal services 9.  Type 2 diabetes mellitus, hemoglobin A1c 5.6.  increase semglee to 36U daily. D/c feeding supplement  10.  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  Discussed with Dr. Wynetta Emery neurosurgery placed on trial of steroids.  No current intervention at this time.  Needs to f/u with NSU after d/c.   11.  Anemia of chronic disease.  Follow-up CBC  12.  Glaucoma.  Legally blind left eye.  Continue eyedrops  13.  BPH.  Continue Flomax 0.4 mg daily  14.  Hyperlipidemia.  Continue Lipitor  15.  Hypertension.  Monitor with increased mobility.  16.  Recent  cholecystitis.  Status post cholecystostomy tube placement drainage of abscess 12/16.  Follow-up outpatient  17.  Acute diarrhea.  D/c senna-docusate  18. Spasticity- continue PT/OT   LOS: 2 days A FACE TO FACE EVALUATION WAS PERFORMED  Layliana Devins P Maven Rosander 12/25/2023, 11:12 AM

## 2023-12-26 DIAGNOSIS — I63512 Cerebral infarction due to unspecified occlusion or stenosis of left middle cerebral artery: Secondary | ICD-10-CM | POA: Diagnosis not present

## 2023-12-26 LAB — RENAL FUNCTION PANEL
Albumin: 3.1 g/dL — ABNORMAL LOW (ref 3.5–5.0)
Anion gap: 8 (ref 5–15)
BUN: 48 mg/dL — ABNORMAL HIGH (ref 8–23)
CO2: 26 mmol/L (ref 22–32)
Calcium: 9 mg/dL (ref 8.9–10.3)
Chloride: 99 mmol/L (ref 98–111)
Creatinine, Ser: 2.61 mg/dL — ABNORMAL HIGH (ref 0.61–1.24)
GFR, Estimated: 25 mL/min — ABNORMAL LOW (ref >60)
Glucose, Bld: 165 mg/dL — ABNORMAL HIGH (ref 70–99)
Phosphorus: 3.9 mg/dL (ref 2.5–4.6)
Potassium: 4.1 mmol/L (ref 3.5–5.1)
Sodium: 133 mmol/L — ABNORMAL LOW (ref 135–145)

## 2023-12-26 LAB — CBC
HCT: 31.5 % — ABNORMAL LOW (ref 39.0–52.0)
Hemoglobin: 10.4 g/dL — ABNORMAL LOW (ref 13.0–17.0)
MCH: 30.2 pg (ref 26.0–34.0)
MCHC: 33 g/dL (ref 30.0–36.0)
MCV: 91.6 fL (ref 80.0–100.0)
Platelets: 162 10*3/uL (ref 150–400)
RBC: 3.44 MIL/uL — ABNORMAL LOW (ref 4.22–5.81)
RDW: 11.7 % (ref 11.5–15.5)
WBC: 9.7 10*3/uL (ref 4.0–10.5)
nRBC: 0 % (ref 0.0–0.2)

## 2023-12-26 LAB — GLUCOSE, CAPILLARY
Glucose-Capillary: 151 mg/dL — ABNORMAL HIGH (ref 70–99)
Glucose-Capillary: 94 mg/dL (ref 70–99)
Glucose-Capillary: 169 mg/dL — ABNORMAL HIGH (ref 70–99)
Glucose-Capillary: 224 mg/dL — ABNORMAL HIGH (ref 70–99)

## 2023-12-26 MED ORDER — HEPARIN SODIUM (PORCINE) 1000 UNIT/ML IJ SOLN
3800.0000 [IU] | Freq: Once | INTRAMUSCULAR | Status: AC
  Administered 2023-12-26: 3800 [IU]
  Filled 2023-12-26: qty 4

## 2023-12-26 MED ORDER — PROSOURCE PLUS PO LIQD
30.0000 mL | Freq: Two times a day (BID) | ORAL | Status: DC
  Administered 2023-12-26 – 2023-12-31 (×9): 30 mL via ORAL
  Filled 2023-12-26 (×7): qty 30

## 2023-12-26 NOTE — Progress Notes (Signed)
 Received patient in bed.Awake,alert and oriented x 4. Consent verified.  Access used: Right hd catheter that worked well.Dressing on date.  Duration of treatment: 3.5 hours.  Uf goal: Met 1 liter.  Hand off to the patient's nurse :

## 2023-12-26 NOTE — Progress Notes (Signed)
 Physical Therapy Session Note  Patient Details  Name: Philip Benjamin MRN: [id number] Date of Birth: Sep 18, 1951  Today's Date: 12/26/2023 PT Individual Time: 7425-9563 + 1040 - 1155 PT Individual Time Calculation (min): 54 min  + 75 min  Short Term Goals: Week 1:  PT Short Term Goal 1 (Week 1): Pt will complete bed mobility with minA without hospial bed features PT Short Term Goal 2 (Week 1): Pt will complete bed<>chair transfers with CGA and LRAD PT Short Term Goal 3 (Week 1): Pt will ambulate 24ft with minA and LRAD PT Short Term Goal 4 (Week 1): Pt will initiate stair training PT Short Term Goal 5 (Week 1): Pt will improve BERG balance score by at least 8 points  Skilled Therapeutic Interventions/Progress Updates:      1st session: Pt sitting in wheelchair to start - agreeable to PT tx. Pt has no c/o pain. He continues to be in a down mood, upset he isn't progressing as quickly as he hoped. He can be hard on himself and expects a lot of himself. Educated on general recovery, PT goals, and continued progress.   Pt reporting urgent need to void. Used Urinal in standing with privacy screen. RW for UE support while standing and CGA/minA for balance - relies on back of legs against mat table for stability during standing. Pt continent of bladder void.   Wheeled to main rehab gym for time. Donned slip-on shoes with totalA. Also retrieved a a Thusane PLS to address ankle DF weakness and improve clearance during swing phase. Ambulated ~61ft with min/modA and RW with cues for increasing stride and step length on his R side. Assist needed for RW management. Requires external assist for advancing RLE in swing phase.   Applied "toe cap" via shoe cover to help with reducing friction during swing phase to improve step length and gait quality. Pt ambulated 2x55ft (seated rest break) with min/modA with RW with similar assist and cues as above. Improved step length on R with toe cap. Able to take ~10  steps on R side without external assist but needs external assist for remaining distance.   Pt returned to his room at end of treatment. Pt encouraged with his performance this AM and some progress made. Left with his needs met, aware of upcoming OT session.   2nd session: Pt sitting in wheelchair and agreeable to therapy. Has no complaints of pain. Pt wearing AFO and shoe cover on R foot during session.   Transported at wheelchair level to day room rehab gym. Focused session on gait training, standing balance, and improving overall activity tolerance.   Gait training completed x66ft with min/modA and RW - similar deficits and cues as listed above. Pt required mod/maxA for turning once reaching the goal of 60ft. Needed +2 assist to provide wheelchair for safe sitting 2/2 fatigue. Retrieved LiteGait to help facilitate gait training. Harness donned/doffed in sitting for safety. Only partial BWS utilized and BUE support on LiteGait. Pt ambulated a total of ~70ft broken up with x1 seated rest break. +2 assist needed for wheelchair follow. Improved stride length, foot clearance, and hip flexor activation with LiteGait.   Standing balance targeted with games of cornhole toss and removing squigz from mirror. Patient fluctuating between min to mod assist for standing balance during these therapeutic activities. Assist primarily needed for anterior > posterior LOB. Unable to complete activities without UE support. Difficult for him to reach overhead if both UE 's. Baseline vision deficits affecting acuity.  Pt questioning his fluid intake - aware he's on fluid restriction 2/2 ESRD but he's unsure of amount. MD and LPN made aware of need for further ed via secure chat. Pt reports he's only drinking 1-2 cups of water/day 2/2 concern for over hydrating.    Returned to his room and assisted to bed with a modA stand pivot transfer - pt with x1 LOB that needed modA for recovery as he transferred. MinA for  sit>supine for RLE management. Left with all needs met. Aware of upcoming dialysis this PM.    Therapy Documentation Precautions:  Precautions Precautions: Fall Precaution/Restrictions Comments: R hemi Restrictions Weight Bearing Restrictions Per Provider Order: No Other Position/Activity Restrictions: r hemiparesis General:    Therapy/Group: Individual Therapy  [name] 12/26/2023, 7:46 AM

## 2023-12-26 NOTE — Progress Notes (Signed)
 [facility] Progress Note   Subjective:   Seen in room - working with OT. Feels well, no CP/dyspnea. For HD later today.  Objective Vitals:   12/25/23 1627 12/25/23 1923 12/26/23 0418 12/26/23 0530  BP: 125/81 131/67 130/67   Pulse: 64 72 (!) 52   Resp: 18 16 16    Temp: 98.4 F (36.9 C) 98.4 F (36.9 C) 98.3 F (36.8 C)   TempSrc: Oral Oral    SpO2: 100% 100% 100%   Weight:    90.1 kg  Height:       Physical Exam General: Elderly man, NAD. Room air Heart: RRR; no murmur Lungs: CTAB; no rales Extremities: 1+ pedal edema only Dialysis Access: [facility]  Additional Objective Labs: Basic Metabolic Panel: Recent Labs  Lab 12/20/23 0930 12/21/23 1331 12/22/23 1118 12/23/23 0348 12/23/23 1225 12/26/23 0633  NA 133*   < > 130* 135  --  133*  K 3.7   < > 3.9 3.7  --  4.1  CL 97*   < > 97* 101  --  99  CO2 25   < > 24 26  --  26  GLUCOSE 284*   < > 276* 130*  --  165*  BUN 21   < > 50* 34*  --  48*  CREATININE 2.44*   < > 2.78* 1.87* 2.31* 2.61*  CALCIUM 8.7*   < > 8.6* 8.5*  --  9.0  PHOS 2.7  --   --   --   --  3.9   < > = values in this interval not displayed.   Liver Function Tests: Recent Labs  Lab 12/22/23 1118 12/23/23 0348 12/26/23 0633  AST 39 38  --   ALT 45* 46*  --   ALKPHOS 86 79  --   BILITOT 0.3 0.5  --   PROT 5.6* 5.5*  --   ALBUMIN 2.9* 2.9* 3.1*   CBC: Recent Labs  Lab 12/20/23 0930 12/22/23 1146 12/23/23 0348 12/23/23 1225 12/26/23 0633  WBC 7.0 8.5 7.9 8.0 9.7  HGB 9.0* 8.9* 9.1* 9.9* 10.4*  HCT 28.5* 27.4* 27.8* 29.7* 31.5*  MCV 95.0 93.2 92.7 93.1 91.6  PLT 131* 148* 148* 158 162   Medications:   aspirin  81 mg Oral Daily   atorvastatin  40 mg Oral Daily   Chlorhexidine Gluconate Cloth  6 each Topical BID   cholecalciferol  1,000 Units Oral Daily   cycloSPORINE  1 drop Both Eyes BID   dexamethasone  4 mg Oral Daily   dorzolamide  1 drop Both Eyes BID   And   timolol  1 drop Both Eyes BID   FLUoxetine  20 mg  Oral Daily   heparin  5,000 Units Subcutaneous Q8H   insulin aspart  0-20 Units Subcutaneous TID WC   insulin glargine  36 Units Subcutaneous Daily   latanoprost  1 drop Left Eye QHS   multivitamin  1 tablet Oral QHS   tamsulosin  0.4 mg Oral Q supper   tiZANidine  2 mg Oral BID    Dialysis Orders MWF - [facility] 4hr, 350/500, EDW 86.5kg, 2K/2.5Ca, TDC, heparin 1000U/hr - Venofer 50mg  IV q Monday - Mircera IV q 4 weeks (last 2/17)  Assessment/Plan: Hx acute L lentiform nucleus CVA: Now in CIR getting PT/OT. 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 Hx cholecystitis 09/2023: S/p IR chole tube, now removed. ESRD: Continue HD on MWF schedule -  for HD later today. HTN/volume: BP stable, mild LE edema, UF as tolerated. Anemia of ESRD: Hgb 10.4 - has not been getting ESA here - can give if Hgb drops. Secondary HPTH: Ca/Phos ok - not on binders, on Vit D3. Nutrition: Alb low, will add protein supplements. T2DM Dispo: Pending d/c date 3/20.   Ozzie Hoyle, PA-C 12/26/2023, 10:13 AM  [employer]

## 2023-12-26 NOTE — Progress Notes (Signed)
 Pt OTF for AM line  care

## 2023-12-26 NOTE — Progress Notes (Signed)
 PROGRESS NOTE   Subjective/Complaints: No new complaints this morning Reports situational depression, feels his progress has been slower than he would like, feels he is handling this well on his own  ROS: +malaise, +depression- situational   Objective:   No results found. Recent Labs    12/23/23 1225 12/26/23 0633  WBC 8.0 9.7  HGB 9.9* 10.4*  HCT 29.7* 31.5*  PLT 158 162   Recent Labs    12/23/23 1225 12/26/23 0633  NA  --  133*  K  --  4.1  CL  --  99  CO2  --  26  GLUCOSE  --  165*  BUN  --  48*  CREATININE 2.31* 2.61*  CALCIUM  --  9.0   No intake or output data in the 24 hours ending 12/26/23 1024       Physical Exam: Vital Signs Blood pressure 130/67, pulse (!) 52, temperature 98.3 F (36.8 C), resp. rate 16, height 6\' 1"  (1.854 m), weight 90.1 kg, SpO2 100%. Gen: no distress, normal appearing HEENT: oral mucosa pink and moist, NCAT Cardio: Bradycardia Chest: normal effort, normal rate of breathing Abd: soft, non-distended Ext: no edema Psych: pleasant, normal affect Skin: intact Musculoskeletal:     Cervical back: Neck supple.     Comments: RUE- biceps 4+/5, Triceps 4+/5; WE 5-/5; Grip 4/5; FA 4-/5 LUE- Biceps, triceps 5-/5; WE 5-/5; Grip 4+/5; FA 4/5 RLE- HF 4-/5; otherwise 4/5 rest of RLE LLE- 4+/5 throughout , stable 3/6 Skin:    General: Skin is warm and dry.     Comments: R chest HD catheter- [place] on coccyx  Neurological:     Mental Status: He is oriented to person, place, and time.     Comments: Patient is alert.  Answers basic questions.  Limited medical historian.  Follows simple commands MAS of 2 in RLE- few beats clonus B/L LE's and MAS of 1+ in LLE (+) hoffman's B/L 3+ DTRs B/L patella  Psychiatric:        Mood and Affect: Mood normal.        Behavior: Behavior normal.     Assessment/Plan: 1. Functional deficits which require 3+ hours per day of interdisciplinary  therapy in a comprehensive inpatient rehab setting. Physiatrist is providing close team supervision and 24 hour management of active medical problems listed below. Physiatrist and rehab team continue to assess barriers to discharge/monitor patient progress toward functional and medical goals  Care Tool:  Bathing    Body parts bathed by patient: Right arm, Chest, Abdomen, Front perineal area, Right upper leg, Left upper leg, Face     Body parts n/a: Left arm, Buttocks, Left lower leg, Right lower leg   Bathing assist Assist Level: Maximal Assistance - Patient 24 - 49% (Per OT Eval)     Upper Body Dressing/Undressing Upper body dressing   What is the patient wearing?: Pull over shirt    Upper body assist Assist Level: Moderate Assistance - Patient 50 - 74%    Lower Body Dressing/Undressing Lower body dressing      What is the patient wearing?: Pants, Underwear/pull up     Lower body assist Assist for lower  body dressing: Maximal Assistance - Patient 25 - 49%     Toileting Toileting    Toileting assist Assist for toileting: Maximal Assistance - Patient 25 - 49%     Transfers Chair/bed transfer  Transfers assist     Chair/bed transfer assist level: Minimal Assistance - Patient > 75%     Locomotion Ambulation   Ambulation assist      Assist level: Moderate Assistance - Patient 50 - 74% Assistive device: Walker-rolling Max distance: 18ft   Walk 10 feet activity   Assist     Assist level: Moderate Assistance - Patient - 50 - 74% Assistive device: Walker-rolling   Walk 50 feet activity   Assist Walk 50 feet with 2 turns activity did not occur: Safety/medical concerns         Walk 150 feet activity   Assist Walk 150 feet activity did not occur: Safety/medical concerns         Walk 10 feet on uneven surface  activity   Assist Walk 10 feet on uneven surfaces activity did not occur: Safety/medical concerns          Wheelchair     Assist Is the patient using a wheelchair?: Yes Type of Wheelchair: Manual    Wheelchair assist level: Supervision/Verbal cueing Max wheelchair distance: 150'    Wheelchair 50 feet with 2 turns activity    Assist        Assist Level: Supervision/Verbal cueing   Wheelchair 150 feet activity     Assist      Assist Level: Supervision/Verbal cueing   Blood pressure 130/67, pulse (!) 52, temperature 98.3 F (36.8 C), resp. rate 16, height 6\' 1"  (1.854 m), weight 90.1 kg, SpO2 100%.  Medical Problem List and Plan: 1. 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  Antithrombotics: -DVT/anticoagulation:  Pharmaceutical: Heparin             -antiplatelet therapy: Aspirin 81 mg daily 3. Pain Management: Tylenol as needed 4. Situational depression:  Neuropsych consulted. Offered chaplain consult but patient defers. Prozac 20 mg daily, Xanax 0.25 mg 3 times daily as needed             -antipsychotic agents: N/A 5. Neuropsych/cognition: This patient is capable of making decisions on his own behalf. 6. Skin/Wound Care: Routine skin checks 7. Fluids/Electrolytes/Nutrition: Routine in and outs with follow-up chemistries 8.  End-stage renal disease.  Hemodialysis per renal services 9.  Type 2 diabetes mellitus, hemoglobin A1c 5.6.  increase semglee to 36U daily. D/c feeding supplement  10.  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  Discussed with Dr. Wynetta Emery neurosurgery placed on trial of steroids.  No current intervention at this time.  Needs to f/u with NSU after d/c.   11.  Anemia of chronic disease.  Follow-up CBC  12.  Glaucoma.  Legally blind left eye.  Continue eyedrops  13.  BPH.  Continue Flomax 0.4 mg daily  14.  Hyperlipidemia.  continue Lipitor  15.  Hypertension.  Monitor with increased  mobility.  16.  Recent cholecystitis.  Status post cholecystostomy tube placement drainage of abscess 12/16.  Follow-up outpatient  17.  Acute diarrhea.  D/c senna-docusate  18. Spasticity- continue PT/OT, scheduled tizanidine BID  19. Bradycardia: medications reviewed and he is not on any heart rate lowering agents   LOS: 3 days A FACE TO FACE EVALUATION WAS PERFORMED  Merie Wulf P Lydiana Milley 12/26/2023, 10:24 AM

## 2023-12-26 NOTE — Progress Notes (Signed)
 Occupational Therapy Session Note  Patient Details  Name: Philip Benjamin MRN: [id number] Date of Birth: 1951/08/24  Today's Date: 12/26/2023 OT Individual Time: 2130-8657 OT Individual Time Calculation (min): 77 min    Short Term Goals: Week 1:  OT Short Term Goal 1 (Week 1): Patient will don pull over shirt over head with min assist OT Short Term Goal 2 (Week 1): Patient will don pants over feet with min assist OT Short Term Goal 3 (Week 1): Patient will pull pants up/down over hips with min assist OT Short Term Goal 4 (Week 1): Patient will complete stand step toilet transfer with close supervision OT Short Term Goal 5 (Week 1): Patient will feed himself with intermittent min assist following set up  Skilled Therapeutic Interventions/Progress Updates:   Patient presents sitting in wheelchair reporting feeling a bit brighter today.  Patient feels he walked better/ further in PT today - noted patient with AFO and shoe cover.  Patient agreeable to get bathed and dressed at sink.  Nursing finishing medicine pass.  Patient showing improved ability to remove clothing from feet, don shirt over arms - still needs assist to fully pull over-shirt over his head.   Patient recalled that is easier for him to be successful if he dresses right foot first.  R leg kicks out into extension with effort to lean forward.  Patient has to manually reposition it prior to standing.  Patient standing with set up assistance then contact guard.  Patient leans hips to sink for stability and to compensate for backward loss of balance.  Patient left up in wheelchair with chair pad alarm in place and engaged and call bell and personal items in reach.    Therapy Documentation Precautions:  Precautions Precautions: Fall Precaution/Restrictions Comments: R hemi Restrictions Weight Bearing Restrictions Per Provider Order: No Other Position/Activity Restrictions: r hemiparesis   Pain: Denies pain          Therapy/Group: Individual Therapy  Collier Salina 12/26/2023, 10:22 AM

## 2023-12-26 NOTE — IPOC Note (Addendum)
 Overall Plan of Care [facility]) Patient Details Name: Philip Benjamin MRN: [id number] DOB: December 26, 1950  Admitting Diagnosis: Left middle cerebral artery stroke [facility])  Hospital Problems: Principal Problem:   Left middle cerebral artery stroke [facility])     Functional Problem List: Nursing Bladder, Pain, Bowel, Safety, Endurance, Medication Management  PT Balance, Safety, Endurance, Motor, Sensory  OT Balance, Motor, Sensory, Vision, Edema, Endurance  SLP    TR         Basic ADL's: OT Eating, Grooming, Bathing, Dressing, Toileting     Advanced  ADL's: OT       Transfers: PT Bed Mobility, Bed to Chair, Car  OT Toilet     Locomotion: PT Ambulation, Stairs, Wheelchair Mobility     Additional Impairments: OT Fuctional Use of Upper Extremity  SLP        TR      Anticipated Outcomes Item Anticipated Outcome  Self Feeding Set up  Swallowing      Basic self-care  Min assist  Toileting  Min assist   Bathroom Transfers Mod I (Stand pivot to toilet)  Bowel/Bladder  manage bowel w mod I assist and bladder w toileting  Transfers  supervision  Locomotion  CGA  Communication     Cognition     Pain  < 4 with prns  Safety/Judgment  manage safety w cues   Therapy Plan: PT Intensity: Minimum of 1-2 x/day ,45 to 90 minutes PT Frequency: 5 out of 7 days PT Duration Estimated Length of Stay: 2 weeks OT Intensity: Minimum of 1-2 x/day, 45 to 90 minutes OT Frequency: 5 out of 7 days OT Duration/Estimated Length of Stay: 12-14 days     Team Interventions: Nursing Interventions Patient/Family Education, Pain Management, Bladder Management, Medication Management, Discharge Planning, Bowel Management, Disease Management/Prevention  PT interventions Ambulation/gait training, Balance/vestibular training, Cognitive remediation/compensation, Community reintegration, Discharge planning, Disease management/prevention, Functional electrical stimulation, Neuromuscular re-education,  Patient/family education, Skin care/wound management, Stair training, Therapeutic Exercise, UE/LE Coordination activities, Wheelchair propulsion/positioning, UE/LE Strength taining/ROM, Visual/perceptual remediation/compensation, Therapeutic Activities, Splinting/orthotics, Psychosocial support, Pain management, Functional mobility training, DME/adaptive equipment instruction  OT Interventions Balance/vestibular training, Disease mangement/prevention, Neuromuscular re-education, Patient/family education, Self Care/advanced ADL retraining, Splinting/orthotics, Therapeutic Exercise, UE/LE Coordination activities, Visual/perceptual remediation/compensation, UE/LE Strength taining/ROM, Therapeutic Activities, Functional mobility training, DME/adaptive equipment instruction, Discharge planning  SLP Interventions    TR Interventions    SW/CM Interventions Discharge Planning, Psychosocial Support, Patient/Family Education   Barriers to Discharge MD  Medical stability  Nursing Decreased caregiver support, Home environment access/layout, Hemodialysis 2 level 5 ste right rail w spouse/dtr who works night shift, son will assist prn; House hold level ambulation using RW PTA; [facility] M-W-F  PT Decreased caregiver support, Home environment Best boy, Insurance for SNF coverage, Lack of/limited family support    OT      SLP      SW Insurance for SNF coverage     Team Discharge Planning: Destination: PT-Home ,OT- Home , SLP-Home Projected Follow-up: PT-Home health PT, 24 hour supervision/assistance, OT-  Outpatient OT, SLP-None Projected Equipment Needs: PT-To be determined, OT- To be determined, SLP-None recommended by SLP Equipment Details: PT- , OT-  Patient/family involved in discharge planning: PT- Patient,  OT-Patient, SLP-Patient  MD ELOS: 2.5-3 weeks Medical Rehab Prognosis:  Excellent Assessment: The patient has been admitted for CIR therapies with the diagnosis of left lentiform nucleus  infarction. The team will be addressing functional mobility, strength, stamina, balance, safety, adaptive techniques and equipment, self-care, bowel and bladder mgt,  patient and caregiver education. Goals have been set at [employer]. Anticipated discharge destination is home.         See Team Conference Notes for weekly updates to the plan of care

## 2023-12-27 DIAGNOSIS — I63512 Cerebral infarction due to unspecified occlusion or stenosis of left middle cerebral artery: Secondary | ICD-10-CM | POA: Diagnosis not present

## 2023-12-27 LAB — GLUCOSE, CAPILLARY
Glucose-Capillary: 72 mg/dL (ref 70–99)
Glucose-Capillary: 141 mg/dL — ABNORMAL HIGH (ref 70–99)
Glucose-Capillary: 303 mg/dL — ABNORMAL HIGH (ref 70–99)
Glucose-Capillary: 314 mg/dL — ABNORMAL HIGH (ref 70–99)
Glucose-Capillary: 172 mg/dL — ABNORMAL HIGH (ref 70–99)

## 2023-12-27 MED ORDER — SENNA 8.6 MG PO TABS
1.0000 | ORAL_TABLET | Freq: Every day | ORAL | Status: DC
  Administered 2023-12-27: 8.6 mg via ORAL
  Filled 2023-12-27 (×2): qty 1

## 2023-12-27 NOTE — Progress Notes (Signed)
 PROGRESS NOTE   Subjective/Complaints:  Pt reports having difficulty having Bms- very hard and hard to push out- explained some of this is his compressed Spinal cord and neurogenic bowel. LBM yesterday, but hard and hard to push out- his daughter who's a nurse had to do manual disimpaction in acute.   Said took Zanaflex yesterday but still stiff.    Vision "real bad" this AM- hard to see things- worse after HD.   Denies pain.   ROS: +malaise, +depression- situational   Objective:   No results found. Recent Labs    12/26/23 0633  WBC 9.7  HGB 10.4*  HCT 31.5*  PLT 162   Recent Labs    12/26/23 0633  NA 133*  K 4.1  CL 99  CO2 26  GLUCOSE 165*  BUN 48*  CREATININE 2.61*  CALCIUM 9.0    Intake/Output Summary (Last 24 hours) at 12/27/2023 1651 Last data filed at 12/27/2023 0738 Gross per 24 hour  Intake 220 ml  Output 1000 ml  Net -780 ml         Physical Exam: Vital Signs Blood pressure 127/70, pulse 71, temperature 98.1 F (36.7 C), resp. rate 18, height 6\' 1"  (1.854 m), weight 87.6 kg, SpO2 100%.   General: awake, alert, appropriate,sitting up in bed;  NAD HENT: conjugate gaze; oropharynx dry- cannot read clock or see my hair is pink CV: regular rate and rhythm; no JVD Pulmonary: CTA B/L; no W/R/R- good air movement GI: soft, NT, ND, (+)BS- hypoactive Psychiatric: appropriate but depressed affect Neurological: Ox3 MAS of 1+ to 2 in RLE and 1+ in LLE Musculoskeletal:     Cervical back: Neck supple.     Comments: RUE- biceps 4+/5, Triceps 4+/5; WE 5-/5; Grip 4/5; FA 4-/5 LUE- Biceps, triceps 5-/5; WE 5-/5; Grip 4+/5; FA 4/5 RLE- HF 4-/5; otherwise 4/5 rest of RLE LLE- 4+/5 throughout , stable 3/6 Skin:    General: Skin is warm and dry.     Comments: R chest HD catheter- [place] on coccyx  Neurological:     Mental Status: He is oriented to person, place, and time.     Comments: Patient is  alert.  Answers basic questions.  Limited medical historian.  Follows simple commands MAS of 2 in RLE- few beats clonus B/L LE's and MAS of 1+ in LLE (+) hoffman's B/L 3+ DTRs B/L patella  Psychiatric:        Mood and Affect: Mood normal.        Behavior: Behavior normal.     Assessment/Plan: 1. Functional deficits which require 3+ hours per day of interdisciplinary therapy in a comprehensive inpatient rehab setting. Physiatrist is providing close team supervision and 24 hour management of active medical problems listed below. Physiatrist and rehab team continue to assess barriers to discharge/monitor patient progress toward functional and medical goals  Care Tool:  Bathing    Body parts bathed by patient: Right arm, Chest, Abdomen, Front perineal area, Right upper leg, Left upper leg, Face     Body parts n/a: Left arm, Buttocks, Left lower leg, Right lower leg   Bathing assist Assist Level: Maximal Assistance - Patient 24 - 49% (  Per OT Eval)     Upper Body Dressing/Undressing Upper body dressing   What is the patient wearing?: Pull over shirt    Upper body assist Assist Level: Moderate Assistance - Patient 50 - 74%    Lower Body Dressing/Undressing Lower body dressing      What is the patient wearing?: Pants, Underwear/pull up     Lower body assist Assist for lower body dressing: Maximal Assistance - Patient 25 - 49%     Toileting Toileting    Toileting assist Assist for toileting: Maximal Assistance - Patient 25 - 49%     Transfers Chair/bed transfer  Transfers assist     Chair/bed transfer assist level: Minimal Assistance - Patient > 75%     Locomotion Ambulation   Ambulation assist      Assist level: Moderate Assistance - Patient 50 - 74% Assistive device: Walker-rolling Max distance: 57ft   Walk 10 feet activity   Assist     Assist level: Moderate Assistance - Patient - 50 - 74% Assistive device: Walker-rolling   Walk 50 feet  activity   Assist Walk 50 feet with 2 turns activity did not occur: Safety/medical concerns         Walk 150 feet activity   Assist Walk 150 feet activity did not occur: Safety/medical concerns         Walk 10 feet on uneven surface  activity   Assist Walk 10 feet on uneven surfaces activity did not occur: Safety/medical concerns         Wheelchair     Assist Is the patient using a wheelchair?: Yes Type of Wheelchair: Manual    Wheelchair assist level: Supervision/Verbal cueing Max wheelchair distance: 150'    Wheelchair 50 feet with 2 turns activity    Assist        Assist Level: Supervision/Verbal cueing   Wheelchair 150 feet activity     Assist      Assist Level: Supervision/Verbal cueing   Blood pressure 127/70, pulse 71, temperature 98.1 F (36.7 C), resp. rate 18, height 6\' 1"  (1.854 m), weight 87.6 kg, SpO2 100%.  Medical Problem List and Plan: 1. 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  Antithrombotics: -DVT/anticoagulation:  Pharmaceutical: Heparin             -antiplatelet therapy: Aspirin 81 mg daily 3. Pain Management: Tylenol as needed 4. Situational depression:  Neuropsych consulted. Offered chaplain consult but patient defers. Prozac 20 mg daily, Xanax 0.25 mg 3 times daily as needed             -antipsychotic agents: N/A 5. Neuropsych/cognition: This patient is capable of making decisions on his own behalf. 6. Skin/Wound Care: Routine skin checks 7. Fluids/Electrolytes/Nutrition: Routine in and outs with follow-up chemistries 8.  End-stage renal disease.  Hemodialysis per renal services 9.  Type 2 diabetes mellitus, hemoglobin A1c 5.6.  increase semglee to 36U daily. D/c feeding supplement  3/8- Cbgs very labile- 72 to 303 in last 24 hours- cannot increased because fasting  BG 72 this Am- will see if eating snacks 10.  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  Discussed with Dr. Wynetta Emery neurosurgery placed on trial of steroids.  No current  intervention at this time.  Needs to f/u with NSU after d/c.   11.  Anemia of chronic disease.  Follow-up CBC  12.  Glaucoma.  Legally blind left eye.  Continue eyedrops  13.  BPH.  Continue Flomax 0.4 mg daily  14.  Hyperlipidemia.  continue Lipitor  15.  Hypertension.  Monitor with increased mobility.  16.  Recent cholecystitis.  Status post cholecystostomy tube placement drainage of abscess 12/16.  Follow-up outpatient  17.  Acute diarrhea.  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 Bradycardia: medications reviewed and he is not on any heart rate lowering agents  3/8- no bradycardia this AM 20. Poor intake  3/8- explained not drinking a lot of water- not even his allowed water, because scared of diarrhea.advised 4 cups/day minimum.    I spent a total of 37   minutes on total care today- >50% coordination of care- due to   D/w pt about neurogenic bowel, spasticity and constipation in setting of SCI  LOS: 4 days A FACE TO FACE EVALUATION WAS PERFORMED  Ihan Pat 12/27/2023, 4:51 PM

## 2023-12-27 NOTE — Progress Notes (Signed)
 [facility] Progress Note   Subjective:   Seen in room - s/p HD yesterday with 1L off. Denies CP/dyspnea. Says his vision is worse today.  Objective Vitals:   12/26/23 1742 12/26/23 1810 12/26/23 1846 12/27/23 0604  BP:   132/74 (!) 113/57  Pulse:   73 62  Resp:   16 16  Temp: 98.4 F (36.9 C)  98.2 F (36.8 C) 98.2 F (36.8 C)  TempSrc: Oral     SpO2:   100% 100%  Weight:  87.6 kg    Height:       Physical Exam General: Elderly man, NAD. Room air Heart: RRR; no murmur Lungs: CTAB; no rales Extremities: 1+ pedal edema only Dialysis Access: [facility]  Additional Objective Labs: Basic Metabolic Panel: Recent Labs  Lab 12/20/23 0930 12/21/23 1331 12/22/23 1118 12/23/23 0348 12/23/23 1225 12/26/23 0633  NA 133*   < > 130* 135  --  133*  K 3.7   < > 3.9 3.7  --  4.1  CL 97*   < > 97* 101  --  99  CO2 25   < > 24 26  --  26  GLUCOSE 284*   < > 276* 130*  --  165*  BUN 21   < > 50* 34*  --  48*  CREATININE 2.44*   < > 2.78* 1.87* 2.31* 2.61*  CALCIUM 8.7*   < > 8.6* 8.5*  --  9.0  PHOS 2.7  --   --   --   --  3.9   < > = values in this interval not displayed.   Liver Function Tests: Recent Labs  Lab 12/22/23 1118 12/23/23 0348 12/26/23 0633  AST 39 38  --   ALT 45* 46*  --   ALKPHOS 86 79  --   BILITOT 0.3 0.5  --   PROT 5.6* 5.5*  --   ALBUMIN 2.9* 2.9* 3.1*   CBC: Recent Labs  Lab 12/20/23 0930 12/22/23 1146 12/23/23 0348 12/23/23 1225 12/26/23 0633  WBC 7.0 8.5 7.9 8.0 9.7  HGB 9.0* 8.9* 9.1* 9.9* 10.4*  HCT 28.5* 27.4* 27.8* 29.7* 31.5*  MCV 95.0 93.2 92.7 93.1 91.6  PLT 131* 148* 148* 158 162   CBG: Recent Labs  Lab 12/26/23 0621 12/26/23 1200 12/26/23 1836 12/26/23 2123 12/27/23 0640  GLUCAP 151* 94 169* 224* 72   Medications:   (feeding supplement) PROSource Plus  30 mL Oral BID BM   aspirin  81 mg Oral Daily   atorvastatin  40 mg Oral Daily   Chlorhexidine Gluconate Cloth  6 each Topical BID   cholecalciferol   1,000 Units Oral Daily   cycloSPORINE  1 drop Both Eyes BID   dexamethasone  4 mg Oral Daily   dorzolamide  1 drop Both Eyes BID   And   timolol  1 drop Both Eyes BID   FLUoxetine  20 mg Oral Daily   heparin  5,000 Units Subcutaneous Q8H   insulin aspart  0-20 Units Subcutaneous TID WC   insulin glargine  36 Units Subcutaneous Daily   latanoprost  1 drop Left Eye QHS   multivitamin  1 tablet Oral QHS   senna  1 tablet Oral Daily   tamsulosin  0.4 mg Oral Q supper   tiZANidine  2 mg Oral BID    Dialysis Orders MWF - [facility] 4hr, 350/500, EDW 86.5kg, 2K/2.5Ca, TDC, heparin 1000U/hr - Venofer 50mg  IV q Monday - Mircera  IV q 4 weeks (last 2/17)   Assessment/Plan: Hx acute L lentiform nucleus CVA: Now in CIR getting PT/OT. Is vision worsening related to this? 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 Hx cholecystitis 09/2023: S/p IR chole tube, now removed. ESRD: Continue HD on MWF schedule - next HD Monday HTN/volume: BP stable, mild LE edema, UF as tolerated. Anemia of ESRD: Hgb 10.4 - has not been getting ESA here - can give if Hgb drops. Secondary HPTH: Ca/Phos ok - not on binders, on Vit D3. Nutrition: Alb low, continue protein supplements. T2DM Dispo: Pending d/c date 3/20.   Ozzie Hoyle, PA-C 12/27/2023, 9:23 AM  [employer]

## 2023-12-27 NOTE — Plan of Care (Signed)
  Problem: Consults Goal: RH STROKE PATIENT EDUCATION Description: See Patient Education module for education specifics  Outcome: Progressing   Problem: RH BOWEL ELIMINATION Goal: RH STG MANAGE BOWEL WITH ASSISTANCE Description: STG Manage Bowel with mod I Assistance. Outcome: Progressing   Problem: RH BLADDER ELIMINATION Goal: RH STG MANAGE BLADDER WITH ASSISTANCE Description: STG Manage Bladder With min Assistance Outcome: Progressing   Problem: RH KNOWLEDGE DEFICIT Goal: RH STG INCREASE KNOWLEDGE OF DIABETES Description: Patient and family will be able to manage DM using educational resources for medications and dietary modification independently Outcome: Progressing Goal: RH STG INCREASE KNOWLEDGE OF HYPERTENSION Description: Patient and family will be able to manage HTN using educational resources for medications and dietary modification independently Outcome: Progressing Note: Teach back of what high blood pressure is and how it is related to what brought them to the hospital Goal: RH STG INCREASE KNOWLEGDE OF HYPERLIPIDEMIA Description: Patient and family will be able to manage HLD using educational resources for medications and dietary modification independently Outcome: Progressing Note: Taught back on how this is linked with hypertension   Problem: RH SAFETY Goal: RH STG ADHERE TO SAFETY PRECAUTIONS W/ASSISTANCE/DEVICE Description: STG Adhere to Safety Precautions With cues  Assistance/Device. Outcome: Adequate for Discharge   Problem: RH PAIN MANAGEMENT Goal: RH STG PAIN MANAGED AT OR BELOW PT'S PAIN GOAL Description: < 4 with prns Outcome: Adequate for Discharge

## 2023-12-28 ENCOUNTER — Encounter (HOSPITAL_COMMUNITY): Payer: Self-pay | Admitting: Physical Medicine and Rehabilitation

## 2023-12-28 DIAGNOSIS — I63512 Cerebral infarction due to unspecified occlusion or stenosis of left middle cerebral artery: Secondary | ICD-10-CM | POA: Diagnosis not present

## 2023-12-28 LAB — GLUCOSE, CAPILLARY
Glucose-Capillary: 22 mg/dL — CL (ref 70–99)
Glucose-Capillary: 33 mg/dL — CL (ref 70–99)
Glucose-Capillary: 101 mg/dL — ABNORMAL HIGH (ref 70–99)
Glucose-Capillary: 65 mg/dL — ABNORMAL LOW (ref 70–99)
Glucose-Capillary: 82 mg/dL (ref 70–99)
Glucose-Capillary: 198 mg/dL — ABNORMAL HIGH (ref 70–99)
Glucose-Capillary: 158 mg/dL — ABNORMAL HIGH (ref 70–99)

## 2023-12-28 LAB — HEPATITIS B SURFACE ANTIGEN: Hepatitis B Surface Ag: NONREACTIVE

## 2023-12-28 MED ORDER — SENNA 8.6 MG PO TABS
1.0000 | ORAL_TABLET | ORAL | Status: DC
  Administered 2023-12-30 – 2024-01-01 (×2): 8.6 mg via ORAL
  Filled 2023-12-28 (×2): qty 1

## 2023-12-28 NOTE — Plan of Care (Signed)
  Problem: Consults Goal: RH STROKE PATIENT EDUCATION Description: See Patient Education module for education specifics  Outcome: Progressing   Problem: RH BOWEL ELIMINATION Goal: RH STG MANAGE BOWEL WITH ASSISTANCE Description: STG Manage Bowel with mod I Assistance. Outcome: Progressing   Problem: RH BLADDER ELIMINATION Goal: RH STG MANAGE BLADDER WITH ASSISTANCE Description: STG Manage Bladder With min Assistance Outcome: Progressing   Problem: RH SAFETY Goal: RH STG ADHERE TO SAFETY PRECAUTIONS W/ASSISTANCE/DEVICE Description: STG Adhere to Safety Precautions With cues  Assistance/Device. Outcome: Progressing   Problem: RH PAIN MANAGEMENT Goal: RH STG PAIN MANAGED AT OR BELOW PT'S PAIN GOAL Description: < 4 with prns Outcome: Progressing   Problem: RH KNOWLEDGE DEFICIT Goal: RH STG INCREASE KNOWLEDGE OF DIABETES Description: Patient and family will be able to manage DM using educational resources for medications and dietary modification independently Outcome: Progressing Goal: RH STG INCREASE KNOWLEDGE OF HYPERTENSION Description: Patient and family will be able to manage HTN using educational resources for medications and dietary modification independently Outcome: Progressing Goal: RH STG INCREASE KNOWLEGDE OF HYPERLIPIDEMIA Description: Patient and family will be able to manage HLD using educational resources for medications and dietary modification independently Outcome: Progressing Goal: RH STG INCREASE KNOWLEDGE OF STROKE PROPHYLAXIS Description: Patient and family will be able to manage secondary risks using educational resources for medications and dietary modification independently Outcome: Progressing

## 2023-12-28 NOTE — Progress Notes (Signed)
 Pt was hypoglycemic this morning, CBG 22. CBG rechecked 33. Pt was alert and oriented x4. Pt stated he feels fine, no fatigue or other symptoms. Hypoglycemic protocol initiated. After 8 oz of juices, CBG went up to 65. Breakfast tray at bedside, and pt started eating. CBG rechecked again, after 15 mins, was 101. Will continue to monitor.

## 2023-12-28 NOTE — Progress Notes (Signed)
 PROGRESS NOTE   Subjective/Complaints:  Pt reports still has stiffness in legs- esp RLE. Esp with walking.   Still weak.  Just had BM accident in bed- real loose.  Been on toilet in past.  Vision slightly better- since 2 days since HD.    ROS:  Pt denies SOB, abd pain, CP, N/V/C/D, and vision changes  +malaise, +depression- situational   Objective:   No results found. Recent Labs    12/26/23 0633  WBC 9.7  HGB 10.4*  HCT 31.5*  PLT 162   Recent Labs    12/26/23 0633  NA 133*  K 4.1  CL 99  CO2 26  GLUCOSE 165*  BUN 48*  CREATININE 2.61*  CALCIUM 9.0    Intake/Output Summary (Last 24 hours) at 12/28/2023 1102 Last data filed at 12/28/2023 0730 Gross per 24 hour  Intake 960 ml  Output 200 ml  Net 760 ml         Physical Exam: Vital Signs Blood pressure 122/74, pulse 61, temperature 98.2 F (36.8 C), resp. rate 16, height 6\' 1"  (1.854 m), weight 86.6 kg, SpO2 100%.     General: awake, alert, appropriate, sitting up in w/c; NAD HENT: conjugate gaze; oropharynx moist CV: regular rate and rhythm; no JVD Pulmonary: CTA B/L; no W/R/R- good air movement GI: soft, NT, ND, (+)BS- more normoactive Psychiatric: appropriate- interactive Neurological: Ox3 MAS of 1+ to 2 in RLE and 1+ in LLE- no change Musculoskeletal:     Cervical back: Neck supple.     Comments: RUE- biceps 4+/5, Triceps 4+/5; WE 5-/5; Grip 4/5; FA 4-/5 LUE- Biceps, triceps 5-/5; WE 5-/5; Grip 4+/5; FA 4/5 RLE- HF 4-/5; otherwise 4/5 rest of RLE LLE- 4+/5 throughout , stable 3/6 Skin:    General: Skin is warm and dry.     Comments: R chest HD catheter- [place] on coccyx  Neurological:     Mental Status: He is oriented to person, place, and time.     Comments: Patient is alert.  Answers basic questions.  Limited medical historian.  Follows simple commands MAS of 2 in RLE- few beats clonus B/L LE's and MAS of 1+ in LLE (+)  hoffman's B/L 3+ DTRs B/L patella  Psychiatric:        Mood and Affect: Mood normal.        Behavior: Behavior normal.     Assessment/Plan: 1. Functional deficits which require 3+ hours per day of interdisciplinary therapy in a comprehensive inpatient rehab setting. Physiatrist is providing close team supervision and 24 hour management of active medical problems listed below. Physiatrist and rehab team continue to assess barriers to discharge/monitor patient progress toward functional and medical goals  Care Tool:  Bathing    Body parts bathed by patient: Right arm, Chest, Abdomen, Front perineal area, Right upper leg, Left upper leg, Face     Body parts n/a: Left arm, Buttocks, Left lower leg, Right lower leg   Bathing assist Assist Level: Maximal Assistance - Patient 24 - 49% (Per OT Eval)     Upper Body Dressing/Undressing Upper body dressing   What is the patient wearing?: Pull over shirt    Upper  body assist Assist Level: Moderate Assistance - Patient 50 - 74%    Lower Body Dressing/Undressing Lower body dressing      What is the patient wearing?: Pants, Underwear/pull up     Lower body assist Assist for lower body dressing: Maximal Assistance - Patient 25 - 49%     Toileting Toileting    Toileting assist Assist for toileting: Maximal Assistance - Patient 25 - 49%     Transfers Chair/bed transfer  Transfers assist     Chair/bed transfer assist level: Minimal Assistance - Patient > 75%     Locomotion Ambulation   Ambulation assist      Assist level: Moderate Assistance - Patient 50 - 74% Assistive device: Walker-rolling Max distance: 51ft   Walk 10 feet activity   Assist     Assist level: Moderate Assistance - Patient - 50 - 74% Assistive device: Walker-rolling   Walk 50 feet activity   Assist Walk 50 feet with 2 turns activity did not occur: Safety/medical concerns         Walk 150 feet activity   Assist Walk 150 feet  activity did not occur: Safety/medical concerns         Walk 10 feet on uneven surface  activity   Assist Walk 10 feet on uneven surfaces activity did not occur: Safety/medical concerns         Wheelchair     Assist Is the patient using a wheelchair?: Yes Type of Wheelchair: Manual    Wheelchair assist level: Supervision/Verbal cueing Max wheelchair distance: 150'    Wheelchair 50 feet with 2 turns activity    Assist        Assist Level: Supervision/Verbal cueing   Wheelchair 150 feet activity     Assist      Assist Level: Supervision/Verbal cueing   Blood pressure 122/74, pulse 61, temperature 98.2 F (36.8 C), resp. rate 16, height 6\' 1"  (1.854 m), weight 86.6 kg, SpO2 100%.  Medical Problem List and Plan: 1. 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  Antithrombotics: -DVT/anticoagulation:  Pharmaceutical: Heparin             -antiplatelet therapy: Aspirin 81 mg daily 3. Pain Management: Tylenol as needed  3/9- no pain 4. Situational depression:  Neuropsych consulted. Offered chaplain consult but patient defers. Prozac 20 mg daily, Xanax 0.25 mg 3 times daily as needed  3/9- pt still depressed about SCI             -antipsychotic agents: N/A 5. Neuropsych/cognition: This patient is capable of making decisions on his own behalf. 6. Skin/Wound Care: Routine skin checks 7. Fluids/Electrolytes/Nutrition: Routine in and outs with follow-up chemistries 8.  End-stage renal disease.  Hemodialysis per renal services 9.  Type 2 diabetes mellitus, hemoglobin A1c 5.6.  increase semglee to 36U daily. D/c feeding supplement  3/8- Cbgs very labile- 72 to 303 in last 24 hours- cannot increased because fasting BG 72 this Am- will see if eating snacks 10.  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   Discussed with Dr. Wynetta Emery neurosurgery placed on trial of steroids.  No current intervention at this time.  Needs to f/u with NSU after d/c.   11.  Anemia of chronic disease.  Follow-up CBC  12.  Glaucoma.  Legally blind left eye.  Continue eyedrops  13.  BPH.  Continue Flomax 0.4 mg daily  14.  Hyperlipidemia.  continue Lipitor  15.  Hypertension.  Monitor with increased mobility.  3/9- BP controlled con't regimen 16.  Recent cholecystitis.  Status post cholecystostomy tube placement drainage of abscess 12/16.  Follow-up outpatient  17.  Acute diarrhea.  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 Bradycardia: medications reviewed and he is not on any heart rate lowering agents  3/8- no bradycardia this AM 20. Poor intake  3/8- explained not drinking a lot of water- not even his allowed water, because scared of diarrhea.advised 4 cups/day minimum.    I spent a total of  43  minutes on total care today- >50% coordination of care- due to  [facility] pharmacy and went over risk/benefit of increasing Zanaflex- CANNOT use baclofen, no matter what- will leave ot Dr Carlis Abbott to determine   LOS: 5 days A FACE TO FACE EVALUATION WAS PERFORMED  Bernd Crom 12/28/2023, 11:02 AM

## 2023-12-28 NOTE — Progress Notes (Signed)
 Occupational Therapy Session Note  Patient Details  Name: Philip Benjamin MRN: [id number] Date of Birth: Mar 01, 1951  Today's Date: 12/28/2023 OT Individual Time: 1050-1200 OT Individual Time Calculation (min): 70 min   Short Term Goals: Week 1:  OT Short Term Goal 1 (Week 1): Patient will don pull over shirt over head with min assist OT Short Term Goal 2 (Week 1): Patient will don pants over feet with min assist OT Short Term Goal 3 (Week 1): Patient will pull pants up/down over hips with min assist OT Short Term Goal 4 (Week 1): Patient will complete stand step toilet transfer with close supervision OT Short Term Goal 5 (Week 1): Patient will feed himself with intermittent min assist following set up  Skilled Therapeutic Interventions/Progress Updates:   Pt greeted toileting with NT, NT reporting increased loose stools and generalized fatigue. But no reports of pain. OT offering intermediate rest breaks and positioning suggestions throughout session to address pain/fatigue and maximize participation/safety in session.   Functional Transfers: Multiple sit<>stands with light Min A + RW, progressing to CGA. Toilet transfer with Min A + BUE support on grab bar.   Self Care Tasks: Pt received toileting with x2 NT, assists with pericare and LB clothing management, standing support with Min A + BUE on grab bar.   Therapeutic Activities: Table-top activity targeting standing/activity tolerance using RW + CGA, while in stance patient tasked with retrieving squigz from table-top.   9-hole peg test utilized as therapeutic modality not standardized assessment for [facility]. Pt observed to be utilizing L-hand to assist with manipulation/positioning in R hand to allow placement in holes. Increased effort to adjust without support of R-hand, multiple instances of dropping, only able to place x3 pegs. Pt able to pull pegs out of board with cuing for pincer grip.   Pt then instructed in follow exercises with  use of tan thera-putty for R-hand strengthening:  Isolated opposition Gross grip Rolling Bead retrieval  Pt instructed in carryover of the above during time outside of therapy.   Pt remained sitting in WC with 4Ps assessed and immediate needs met. Pt continues to be appropriate for skilled OT intervention to promote further functional independence in ADLs/IADLs.   Therapy Documentation Precautions:  Precautions Precautions: Fall Precaution/Restrictions Comments: R hemi Restrictions Weight Bearing Restrictions Per Provider Order: No Other Position/Activity Restrictions: r hemiparesis   Therapy/Group: Individual Therapy  Lou Cal, OTR/L, MSOT  12/28/2023, 6:30 AM

## 2023-12-28 NOTE — Progress Notes (Signed)
 Physical Therapy Session Note  Patient Details  Name: Philip Benjamin MRN: [id number] Date of Birth: July 13, 1951  Today's Date: 12/28/2023 PT Individual Time: 0820-0935 PT Individual Time Calculation (min): 75 min   Short Term Goals: Week 1:  PT Short Term Goal 1 (Week 1): Pt will complete bed mobility with minA without hospial bed features PT Short Term Goal 2 (Week 1): Pt will complete bed<>chair transfers with CGA and LRAD PT Short Term Goal 3 (Week 1): Pt will ambulate 27ft with minA and LRAD PT Short Term Goal 4 (Week 1): Pt will initiate stair training PT Short Term Goal 5 (Week 1): Pt will improve BERG balance score by at least 8 points  Skilled Therapeutic Interventions/Progress Updates:   Pt received seated in wheelchair getting ready to receive meds from nursing. Pt denies pain, is agreeable to PT tx, and report of 1 bout of diarrhea this AM (nsg aware). Pt declined leaving room this visit due to diarrhea.  Seated therex: unilat march x 20, LAQ x 10, clam shell x 15.  Theract: Pt had urge to urinate after first bout of seated therex. PT maximally assisted him with urinal without any urine production. However, pt stated he needed to get to the bathroom quickly. Pt had bout of diarrhea in his brief and while PT was assisting him to toilet via Stand pivot transfer using bathroom grabbar with PT CGA. Pt was dependent for hygiene and dressing in clean clothes. PT had him stand several times toilet to grabbar via stand pivot transfer with PT CGA each time. Pt able to sustain standing tolerance up to 6 min for hygiene purposes using grabbar multiple bouts.   Seated therex: hip abduction/adduction x 15, iso LAQ with ankle pump x 15, pillow squeeze x 15, pillow squeeze with bil LAQ x 20. Standing therex: STS x 10 with RW with PT CGA and concentration on eccentric descent; standing weightshift x 1 min each fwd/back, and side to side; standing UE reaches x 20.  Pt left in wheelchair; posey  alarm on; no complaints of pain.      Therapy Documentation Precautions:  Precautions Precautions: Fall Precaution/Restrictions Comments: R hemi Restrictions Weight Bearing Restrictions Per Provider Order: No Other Position/Activity Restrictions: r hemiparesis   Therapy/Group: Individual Therapy  Luna Fuse 12/28/2023, 6:19 AM

## 2023-12-28 NOTE — Progress Notes (Signed)
 Occupational Therapy Session Note  Patient Details  Name: Philip Benjamin MRN: [id number] Date of Birth: May 19, 1951  Today's Date: 12/28/2023 OT Individual Time: 1500-1530 OT Individual Time Calculation (min): 30 min    Short Term Goals: Week 1:  OT Short Term Goal 1 (Week 1): Patient will don pull over shirt over head with min assist OT Short Term Goal 2 (Week 1): Patient will don pants over feet with min assist OT Short Term Goal 3 (Week 1): Patient will pull pants up/down over hips with min assist OT Short Term Goal 4 (Week 1): Patient will complete stand step toilet transfer with close supervision OT Short Term Goal 5 (Week 1): Patient will feed himself with intermittent min assist following set up  Skilled Therapeutic Interventions/Progress Updates:    Patient seated at w/c LOF at the time of arrival indicating that he has had diarrhea all day, nursing was present attempting to provide medication to address his symptoms . After receiving his meds, the pt indicated that he needed to go to the restroom.  The pt was transported to the restroom at [facility] for w/c mobility. The pt was able to come from  to sit to stand  using the grab bar for additional balance requiring  MinA for maintaining good anatomical positioning . The pt completed a stand pivot transfer with CGA using the grab bars and the arm of the w/c for positioning himself in front of the commode.  The pt required MinA for doffing his brief and pants and was able to come from standing to sit at [facility] .  The pt was s/uA for  perineal care  for his front and bottom and he required  ModA for donning his brief and pants over his hips and  bottom. The pt was then transported to his living quarters at [facility] and positioned in front of the sink for washing his hands at s/uA. The pt remained at w/c LOF with his call light and bedside table within reach,  all additional needs were addressed prior to exiting the room. .   Therapy  Documentation Precautions:  Precautions Precautions: Fall Precaution/Restrictions Comments: R hemi Restrictions Weight Bearing Restrictions Per Provider Order: No Other Position/Activity Restrictions: r hemiparesis General:   Vital Signs: Therapy Vitals Temp: 97.8 F (36.6 C) Temp Source: Oral Pulse Rate: 65 Resp: 16 BP: 124/69 Patient Position (if appropriate): Sitting Oxygen Therapy SpO2: 99 % O2 Device: Room Air Pain:   ADL: ADL Eating: Minimal assistance Where Assessed-Eating: Chair Grooming: Minimal assistance Where Assessed-Grooming: Sitting at sink Upper Body Bathing: Minimal assistance Where Assessed-Upper Body Bathing: Sitting at sink Lower Body Bathing: Maximal assistance Where Assessed-Lower Body Bathing: Sitting at sink, Standing at sink Upper Body Dressing: Moderate assistance Where Assessed-Upper Body Dressing: Sitting at sink Lower Body Dressing: Maximal assistance Where Assessed-Lower Body Dressing: Sitting at sink, Standing at sink Toileting: Maximal assistance Where Assessed-Toileting: Teacher, adult education: Moderate assistance Toilet Transfer Method: Stand pivot Toilet Transfer Equipment: Bedside commode, Grab bars Tub/Shower Transfer: Unable to assess Tub/Shower Transfer Method: Unable to assess Film/video editor: Unable to assess [employer] Method: Unable to assess ADL Comments: Does not shower due to dialysis port in chest Vision   Perception    Praxis   Balance   Exercises:   Other Treatments:     Therapy/Group: Individual Therapy  Lavona Mound 12/28/2023, 4:34 PM

## 2023-12-28 NOTE — Plan of Care (Signed)
  Problem: RH BOWEL ELIMINATION Goal: RH STG MANAGE BOWEL WITH ASSISTANCE Description: STG Manage Bowel with mod I Assistance. Outcome: Progressing   Problem: RH BLADDER ELIMINATION Goal: RH STG MANAGE BLADDER WITH ASSISTANCE Description: STG Manage Bladder With min Assistance Outcome: Progressing

## 2023-12-28 NOTE — Progress Notes (Signed)
 [facility] Progress Note   Subjective:  Seen in room - working with PT. Feels well - no CP/dyspnea. For HD tomorrow.  Objective Vitals:   12/27/23 0604 12/27/23 1458 12/27/23 1942 12/28/23 0519  BP: (!) 113/57 127/70 124/65 122/74  Pulse: 62 71 64 61  Resp: 16 18 16 16   Temp: 98.2 F (36.8 C) 98.1 F (36.7 C) 98.1 F (36.7 C) 98.2 F (36.8 C)  TempSrc:      SpO2: 100% 100% 100% 100%  Weight:    86.6 kg  Height:       Physical Exam General: Elderly man, NAD. Room air Heart: RRR; no murmur Lungs: CTAB; no rales Extremities: 1+ pedal edema only Dialysis Access: [facility]  Additional Objective Labs: Basic Metabolic Panel: Recent Labs  Lab 12/22/23 1118 12/23/23 0348 12/23/23 1225 12/26/23 0633  NA 130* 135  --  133*  K 3.9 3.7  --  4.1  CL 97* 101  --  99  CO2 24 26  --  26  GLUCOSE 276* 130*  --  165*  BUN 50* 34*  --  48*  CREATININE 2.78* 1.87* 2.31* 2.61*  CALCIUM 8.6* 8.5*  --  9.0  PHOS  --   --   --  3.9   Liver Function Tests: Recent Labs  Lab 12/22/23 1118 12/23/23 0348 12/26/23 0633  AST 39 38  --   ALT 45* 46*  --   ALKPHOS 86 79  --   BILITOT 0.3 0.5  --   PROT 5.6* 5.5*  --   ALBUMIN 2.9* 2.9* 3.1*   CBC: Recent Labs  Lab 12/22/23 1146 12/23/23 0348 12/23/23 1225 12/26/23 0633  WBC 8.5 7.9 8.0 9.7  HGB 8.9* 9.1* 9.9* 10.4*  HCT 27.4* 27.8* 29.7* 31.5*  MCV 93.2 92.7 93.1 91.6  PLT 148* 148* 158 162   Blood Culture    Component Value Date/Time   SDES  10/06/2023 1532    BILE Performed at [facility], [address], [place], [place] [address]    [facility]  10/06/2023 1532    Normal Performed at [facility], [address], [place], [place] [address]    CULT  10/06/2023 1532    No growth aerobically or anaerobically. Performed at [facility], [address], [place], [place] [address]    REPTSTATUS 10/11/2023 FINAL 10/06/2023 1532   Medications:   (feeding supplement) PROSource Plus  30 mL Oral BID  BM   aspirin  81 mg Oral Daily   atorvastatin  40 mg Oral Daily   Chlorhexidine Gluconate Cloth  6 each Topical BID   cholecalciferol  1,000 Units Oral Daily   cycloSPORINE  1 drop Both Eyes BID   dexamethasone  4 mg Oral Daily   dorzolamide  1 drop Both Eyes BID   And   timolol  1 drop Both Eyes BID   FLUoxetine  20 mg Oral Daily   heparin  5,000 Units Subcutaneous Q8H   insulin aspart  0-20 Units Subcutaneous TID WC   insulin glargine  36 Units Subcutaneous Daily   latanoprost  1 drop Left Eye QHS   multivitamin  1 tablet Oral QHS   senna  1 tablet Oral Daily   tamsulosin  0.4 mg Oral Q supper   tiZANidine  2 mg Oral BID    Dialysis Orders MWF - [facility] 4hr, 350/500, EDW 86.5kg, 2K/2.5Ca, TDC, heparin 1000U/hr - Venofer 50mg  IV q Monday - Mircera IV q  4 weeks (last 2/17)   Assessment/Plan: Hx acute L lentiform nucleus CVA: Now in CIR getting PT/OT.  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 Hx cholecystitis 09/2023: S/p IR chole tube, now removed. ESRD: Continue HD on MWF schedule - next HD Monday HTN/volume: BP stable, mild LE edema, UF as tolerated. Anemia of ESRD: Hgb 10.4 - has not been getting ESA here - can give if Hgb drops. Secondary HPTH: Ca/Phos ok - not on binders, on Vit D3. Nutrition: Alb low, continue protein supplements. T2DM Dispo: Pending d/c date 3/20.   Ozzie Hoyle, PA-C 12/28/2023, 10:45 AM  [employer]

## 2023-12-29 DIAGNOSIS — I63512 Cerebral infarction due to unspecified occlusion or stenosis of left middle cerebral artery: Secondary | ICD-10-CM | POA: Diagnosis not present

## 2023-12-29 LAB — RENAL FUNCTION PANEL
Albumin: 2.8 g/dL — ABNORMAL LOW (ref 3.5–5.0)
Anion gap: 11 (ref 5–15)
BUN: 78 mg/dL — ABNORMAL HIGH (ref 8–23)
CO2: 21 mmol/L — ABNORMAL LOW (ref 22–32)
Calcium: 8.5 mg/dL — ABNORMAL LOW (ref 8.9–10.3)
Chloride: 95 mmol/L — ABNORMAL LOW (ref 98–111)
Creatinine, Ser: 3.25 mg/dL — ABNORMAL HIGH (ref 0.61–1.24)
GFR, Estimated: 19 mL/min — ABNORMAL LOW (ref >60)
Glucose, Bld: 230 mg/dL — ABNORMAL HIGH (ref 70–99)
Phosphorus: 3.9 mg/dL (ref 2.5–4.6)
Potassium: 4.3 mmol/L (ref 3.5–5.1)
Sodium: 127 mmol/L — ABNORMAL LOW (ref 135–145)

## 2023-12-29 LAB — CBC
HCT: 27.5 % — ABNORMAL LOW (ref 39.0–52.0)
Hemoglobin: 9.1 g/dL — ABNORMAL LOW (ref 13.0–17.0)
MCH: 30.6 pg (ref 26.0–34.0)
MCHC: 33.1 g/dL (ref 30.0–36.0)
MCV: 92.6 fL (ref 80.0–100.0)
Platelets: 177 10*3/uL (ref 150–400)
RBC: 2.97 MIL/uL — ABNORMAL LOW (ref 4.22–5.81)
RDW: 11.8 % (ref 11.5–15.5)
WBC: 9.1 10*3/uL (ref 4.0–10.5)
nRBC: 0 % (ref 0.0–0.2)

## 2023-12-29 LAB — GLUCOSE, CAPILLARY
Glucose-Capillary: 116 mg/dL — ABNORMAL HIGH (ref 70–99)
Glucose-Capillary: 116 mg/dL — ABNORMAL HIGH (ref 70–99)
Glucose-Capillary: 200 mg/dL — ABNORMAL HIGH (ref 70–99)

## 2023-12-29 MED ORDER — PENTAFLUOROPROP-TETRAFLUOROETH EX AERO: 1.0000 | INHALATION_SPRAY | CUTANEOUS | Status: DC | PRN

## 2023-12-29 MED ORDER — ANTICOAGULANT SODIUM CITRATE 4% (200MG/5ML) IV SOLN: 5.0000 mL | Status: DC | PRN

## 2023-12-29 MED ORDER — CHLORHEXIDINE GLUCONATE CLOTH 2 % EX PADS
6.0000 | MEDICATED_PAD | Freq: Two times a day (BID) | CUTANEOUS | Status: DC
  Administered 2023-12-30 – 2024-01-06 (×13): 6 via TOPICAL

## 2023-12-29 MED ORDER — HEPARIN SODIUM (PORCINE) 1000 UNIT/ML DIALYSIS
1000.0000 [IU] | INTRAMUSCULAR | Status: DC | PRN
  Administered 2023-12-29: 3800 [IU]
  Filled 2023-12-29: qty 1

## 2023-12-29 MED ORDER — HEPARIN SODIUM (PORCINE) 1000 UNIT/ML DIALYSIS
2000.0000 [IU] | Freq: Once | INTRAMUSCULAR | Status: AC
  Administered 2023-12-29: 2000 [IU] via INTRAVENOUS_CENTRAL
  Filled 2023-12-29: qty 2

## 2023-12-29 MED ORDER — LIDOCAINE-PRILOCAINE 2.5-2.5 % EX CREA: 1.0000 | TOPICAL_CREAM | CUTANEOUS | Status: DC | PRN

## 2023-12-29 MED ORDER — ALTEPLASE 2 MG IJ SOLR: 2.0000 mg | Freq: Once | INTRAMUSCULAR | Status: DC | PRN

## 2023-12-29 MED ORDER — LIDOCAINE HCL (PF) 1 % IJ SOLN: 5.0000 mL | INTRAMUSCULAR | Status: DC | PRN

## 2023-12-29 NOTE — Procedures (Signed)
 Received patient in bed to unit.  Alert and oriented.  Informed consent signed and in chart.   TX duration: 3.5 hours  Patient tolerated well.  Transported back to the room  Alert, without acute distress.  Hand-off given to patient's nurse.   Access used: right cath Access issues: none  Total UF removed: 1.5 liters   Lu Duffel, RN [facility]

## 2023-12-29 NOTE — Progress Notes (Signed)
 [facility] Progress Note   Subjective:  Seen working with PT, planning for HD today  Objective Vitals:   12/28/23 0519 12/28/23 1400 12/28/23 2020 12/29/23 0412  BP: 122/74 124/69 128/76 131/71  Pulse: 61 65 64 60  Resp: 16 16 18 18   Temp: 98.2 F (36.8 C) 97.8 F (36.6 C) 97.7 F (36.5 C) 97.7 F (36.5 C)  TempSrc:  Oral  Oral  SpO2: 100% 99% 100% 100%  Weight: 86.6 kg     Height:       Physical Exam General: Elderly man, NAD. Room air Heart: normal rate Lungs: Bilateral chest rise with no increased work of breathing Extremities: 1+ pedal edema only Dialysis Access: [facility]  Additional Objective Labs: Basic Metabolic Panel: Recent Labs  Lab 12/22/23 1118 12/23/23 0348 12/23/23 1225 12/26/23 0633  NA 130* 135  --  133*  K 3.9 3.7  --  4.1  CL 97* 101  --  99  CO2 24 26  --  26  GLUCOSE 276* 130*  --  165*  BUN 50* 34*  --  48*  CREATININE 2.78* 1.87* 2.31* 2.61*  CALCIUM 8.6* 8.5*  --  9.0  PHOS  --   --   --  3.9   Liver Function Tests: Recent Labs  Lab 12/22/23 1118 12/23/23 0348 12/26/23 0633  AST 39 38  --   ALT 45* 46*  --   ALKPHOS 86 79  --   BILITOT 0.3 0.5  --   PROT 5.6* 5.5*  --   ALBUMIN 2.9* 2.9* 3.1*   CBC: Recent Labs  Lab 12/22/23 1146 12/23/23 0348 12/23/23 1225 12/26/23 0633  WBC 8.5 7.9 8.0 9.7  HGB 8.9* 9.1* 9.9* 10.4*  HCT 27.4* 27.8* 29.7* 31.5*  MCV 93.2 92.7 93.1 91.6  PLT 148* 148* 158 162   Blood Culture    Component Value Date/Time   SDES  10/06/2023 1532    BILE Performed at [facility], [address], [place], [place] [address]    [facility]  10/06/2023 1532    Normal Performed at [facility], [address], [place], [place] [address]    CULT  10/06/2023 1532    No growth aerobically or anaerobically. Performed at [facility], [address], [place], [place] [address]    REPTSTATUS 10/11/2023 FINAL 10/06/2023 1532   Medications:   (feeding supplement) PROSource Plus  30 mL  Oral BID BM   aspirin  81 mg Oral Daily   atorvastatin  40 mg Oral Daily   Chlorhexidine Gluconate Cloth  6 each Topical Q12H   cholecalciferol  1,000 Units Oral Daily   cycloSPORINE  1 drop Both Eyes BID   dexamethasone  4 mg Oral Daily   dorzolamide  1 drop Both Eyes BID   And   timolol  1 drop Both Eyes BID   FLUoxetine  20 mg Oral Daily   heparin  5,000 Units Subcutaneous Q8H   insulin aspart  0-20 Units Subcutaneous TID WC   insulin glargine  36 Units Subcutaneous Daily   latanoprost  1 drop Left Eye QHS   multivitamin  1 tablet Oral QHS   [START ON 12/30/2023] senna  1 tablet Oral QODAY   tamsulosin  0.4 mg Oral Q supper   tiZANidine  2 mg Oral BID    Dialysis Orders MWF - [facility] 4hr, 350/500, EDW 86.5kg, 2K/2.5Ca, TDC, heparin 1000U/hr - Venofer 50mg  IV q Monday - Mircera IV q  4 weeks (last 2/17)   Assessment/Plan: Hx acute L lentiform nucleus CVA: Now in CIR getting PT/OT.  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 Hx cholecystitis 09/2023: S/p IR chole tube, now removed. ESRD: Continue HD on MWF schedule  HTN/volume: BP stable, mild LE edema, UF as tolerated. Anemia of ESRD: Hgb 10.4 - iron labs today, consider esa Secondary HPTH: Ca/Phos ok - not on binders, on Vit D3. Nutrition: Alb low, continue protein supplements. T2DM Dispo: Pending d/c date 3/20.   12/29/2023, 9:49 AM  [facility]

## 2023-12-29 NOTE — Progress Notes (Signed)
 Occupational Therapy Session Note  Patient Details  Name: Philip Benjamin MRN: [id number] Date of Birth: 01/28/1951  Today's Date: 12/29/2023 OT Individual Time: 0850-1000 OT Individual Time Calculation (min): 70 min    Short Term Goals: Week 1:  OT Short Term Goal 1 (Week 1): Patient will don pull over shirt over head with min assist OT Short Term Goal 2 (Week 1): Patient will don pants over feet with min assist OT Short Term Goal 3 (Week 1): Patient will pull pants up/down over hips with min assist OT Short Term Goal 4 (Week 1): Patient will complete stand step toilet transfer with close supervision OT Short Term Goal 5 (Week 1): Patient will feed himself with intermittent min assist following set up  Skilled Therapeutic Interventions/Progress Updates:  Skilled OT intervention completed with focus on ADL retraining, sit > stands, and FM coordination. Pt received seated in w/c with MD present, agreeable to session. No pain reported.  Pt verbalized he feels low energy from having diarrhea all day yesterday; MD aware and pt requested meds to assist with firming of his stool.   Pt requested to wash up. Sponge bathing completed at sink side due to HD port present. Required low vision strategies/cues for independence, however pt reports at baseline his daughter was assisting quite a bit for ADLs due to near blindness even in well lit rooms and impaired BUE sensation/strength. Required mod A for doffing shirt, supervision for UB bathing after items prepped for him, however OT needed to ensure thoroughness of underarms and application of deodorant. Supervision for oral care with + time for coordinating application of toothpaste with brush on sink for stability. Required min A for donning both shirts over 1 shoulder only as pt with [facility] difficulty bilaterally. Able to stand with min A using sink for balance, max A to lower pants, and min/mod A for standing balance due to anterior lean while pt  completed peri-washing. Min A for re-donning shorts over hips with cues to initiate. Donned knee high TEDs for edema management, and bilateral shoes with R AFO dependently for time.  Pt completed FM coordination activity while locating small pieces from theraputty using BUE.  Pt remained seated in w/c, with chair alarm on/activated, and with all needs in reach at end of session.   Therapy Documentation Precautions:  Precautions Precautions: Fall Precaution/Restrictions Comments: R hemi Restrictions Weight Bearing Restrictions Per Provider Order: No Other Position/Activity Restrictions: r hemiparesis    Therapy/Group: Individual Therapy  Melvyn Novas, MS, OTR/L  12/29/2023, 12:20 PM

## 2023-12-29 NOTE — Progress Notes (Signed)
 Physical Therapy Session Note  Patient Details  Name: Philip Benjamin MRN: [id number] Date of Birth: 1951/04/19  Today's Date: 12/29/2023 PT Individual Time: 2952-8413 + 1100-1158 PT Individual Time Calculation (min): 40 min  + 58 min  Short Term Goals: Week 1:  PT Short Term Goal 1 (Week 1): Pt will complete bed mobility with minA without hospial bed features PT Short Term Goal 2 (Week 1): Pt will complete bed<>chair transfers with CGA and LRAD PT Short Term Goal 3 (Week 1): Pt will ambulate 34ft with minA and LRAD PT Short Term Goal 4 (Week 1): Pt will initiate stair training PT Short Term Goal 5 (Week 1): Pt will improve BERG balance score by at least 8 points  Skilled Therapeutic Interventions/Progress Updates:      1st session: Pt in bed awake - has no complaints of pain but reports frustration re: "spending all day on the toilet yesterday" due to frequent loose BM's. Pt reports feeling somewhat better today.  Supine<>sitting EOB with supervision assist using hospital bed features. Pt requesting to toilet before leaving his room "Just in case."  Sit<>stand to RW with heavy minA from EOB with cues for widening BOS and increasing forward trunk lean during transition. Light minA for balance as he transfers into wheelchair.   Wheeled into the bathroom and assisted onto toilet with minA stand pivot transfer while using the grab bars - assist needed for lowering pants in standing. Pt continent of bladder void only. Returned to wheelchair in simillar assist.   Transported at wheelchair level to day room rehab gym. Worked on sit<>stands from mat table using the RW - 2x8 with rest break b/w sets. Pt relies on back of legs for balance and pushing to stand - has poor controlled lowering and frequently "falls" to sit down. Worked on addressing these impairments and compensation techniques. Continues to have poor A/P balance in standing even with RW support.   Ambulatory transfer with minA and  RW to Kinetron - completed x8 minutes at L25cm/sec resistance - used seat belt for added safety and provided intermittent cueing for full AROM bilaterally and to continue cadence to challenge cardiovascular endurance.  Returned to his room and patient left sitting upright. All needs met with call bell in reach. Aware of upcoming OT session.     2nd session: Pt sleeping in manual wheelchair on arrival. Has no complaints of pain. Wearing R AFO.   Transported patient to day room rehab gym. Focused session on gait training using LiteGait overground with partial BWS through harness system. Donned shoe cover on R foot to act as toe cap.   MinA for sit<>Stand to Lite gait (harness donned/doffed in sitting for safety).  -55ft - partial BWS with PT assisting steering/managing LiteGait and providing lateral weight shifting during gait cycle. Continues to struggle with R step length and hip flexor weakness impacting power for swing cycle.   -111ft - increased BWS and similar assist and cues as above. Better stride length and foot clearance on his R side with added BWS. Used bungee-cord for R hip flexor proprioceptive feedback which appeared to help with timing during gait.   -139ft - increased BWS with similar assist and cues. Continued to improve with added BWS. Fatigue impacting tolerance at this point of session. Pt demonstrating motivation and determination for completing full distance.   Pt returned to his room at end of treatment. Requesting to toilet before end. Wheeled into bathroom and completed stand ipvot transfer using grab bars  at [facility] level. Pt continent of bladder void. Charted. Returned to wheelchair and left sitting upright with all needs met.    Therapy Documentation Precautions:  Precautions Precautions: Fall Precaution/Restrictions Comments: R hemi Restrictions Weight Bearing Restrictions Per Provider Order: No Other Position/Activity Restrictions: r  hemiparesis General:     Therapy/Group: Individual Therapy  Philip Benjamin PT 12/29/2023, 7:53 AM

## 2023-12-29 NOTE — Progress Notes (Signed)
 Pt unavailable/otf for AM line care

## 2023-12-29 NOTE — Progress Notes (Signed)
 PROGRESS NOTE   Subjective/Complaints: No new complaints this morning Did have diarrhea over the weekend after receiving laxatives, he is nervous about having diarrhea during HD today   ROS:  Pt denies SOB, abd pain, CP, and vision changes  +malaise, +depression- situational, +diarrhea   Objective:   No results found. No results for input(s): "WBC", "HGB", "HCT", "PLT" in the last 72 hours.  No results for input(s): "NA", "K", "CL", "CO2", "GLUCOSE", "BUN", "CREATININE", "CALCIUM" in the last 72 hours.   Intake/Output Summary (Last 24 hours) at 12/29/2023 1350 Last data filed at 12/29/2023 0706 Gross per 24 hour  Intake 480 ml  Output 325 ml  Net 155 ml         Physical Exam: Vital Signs Blood pressure 131/71, pulse 60, temperature 97.7 F (36.5 C), temperature source Oral, resp. rate 18, height 6\' 1"  (1.854 m), weight 86.6 kg, SpO2 100%.     General: awake, alert, appropriate, sitting up in w/c; NAD HENT: conjugate gaze; oropharynx moist CV: regular rate and rhythm; no JVD Pulmonary: CTA B/L; no W/R/R- good air movement GI: soft, NT, ND, (+)BS- more normoactive Psychiatric: appropriate- interactive Neurological: Ox3 MAS of 1+ to 2 in RLE and 1+ in LLE- no change Musculoskeletal:     Cervical back: Neck supple.     Comments: RUE- biceps 4+/5, Triceps 4+/5; WE 5-/5; Grip 4/5; FA 4-/5 LUE- Biceps, triceps 5-/5; WE 5-/5; Grip 4+/5; FA 4/5 RLE- HF 4-/5; otherwise 4/5 rest of RLE LLE- 4+/5 throughout , stable 3/6 Skin:    General: Skin is warm and dry.     Comments: R chest HD catheter- [place] on coccyx  Neurological:     Mental Status: He is oriented to person, place, and time.     Comments: Patient is alert.  Answers basic questions.  Limited medical historian.  Follows simple commands MAS of 2 in RLE- few beats clonus B/L LE's and MAS of 1+ in LLE (+) hoffman's B/L 3+ DTRs B/L patella, exam 3/10  stable Psychiatric:        Mood and Affect: Mood normal.        Behavior: Behavior normal.     Assessment/Plan: 1. Functional deficits which require 3+ hours per day of interdisciplinary therapy in a comprehensive inpatient rehab setting. Physiatrist is providing close team supervision and 24 hour management of active medical problems listed below. Physiatrist and rehab team continue to assess barriers to discharge/monitor patient progress toward functional and medical goals  Care Tool:  Bathing    Body parts bathed by patient: Right arm, Chest, Abdomen, Front perineal area, Right upper leg, Left upper leg, Face     Body parts n/a: Left arm, Buttocks, Left lower leg, Right lower leg   Bathing assist Assist Level: Maximal Assistance - Patient 24 - 49% (Per OT Eval)     Upper Body Dressing/Undressing Upper body dressing   What is the patient wearing?: Pull over shirt    Upper body assist Assist Level: Moderate Assistance - Patient 50 - 74%    Lower Body Dressing/Undressing Lower body dressing      What is the patient wearing?: Pants, Underwear/pull up  Lower body assist Assist for lower body dressing: Maximal Assistance - Patient 25 - 49%     Toileting Toileting    Toileting assist Assist for toileting: Maximal Assistance - Patient 25 - 49%     Transfers Chair/bed transfer  Transfers assist     Chair/bed transfer assist level: Minimal Assistance - Patient > 75%     Locomotion Ambulation   Ambulation assist      Assist level: Moderate Assistance - Patient 50 - 74% Assistive device: Walker-rolling Max distance: 72ft   Walk 10 feet activity   Assist     Assist level: Moderate Assistance - Patient - 50 - 74% Assistive device: Walker-rolling   Walk 50 feet activity   Assist Walk 50 feet with 2 turns activity did not occur: Safety/medical concerns         Walk 150 feet activity   Assist Walk 150 feet activity did not occur:  Safety/medical concerns         Walk 10 feet on uneven surface  activity   Assist Walk 10 feet on uneven surfaces activity did not occur: Safety/medical concerns         Wheelchair     Assist Is the patient using a wheelchair?: Yes Type of Wheelchair: Manual    Wheelchair assist level: Supervision/Verbal cueing Max wheelchair distance: 150'    Wheelchair 50 feet with 2 turns activity    Assist        Assist Level: Supervision/Verbal cueing   Wheelchair 150 feet activity     Assist      Assist Level: Supervision/Verbal cueing   Blood pressure 131/71, pulse 60, temperature 97.7 F (36.5 C), temperature source Oral, resp. rate 18, height 6\' 1"  (1.854 m), weight 86.6 kg, SpO2 100%.  Medical Problem List and Plan: 1. 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  Antithrombotics: -DVT/anticoagulation:  Pharmaceutical: Heparin             -antiplatelet therapy: Aspirin 81 mg daily 3. Pain Management: Tylenol as needed  3/9- no pain 4. Situational depression:  Neuropsych consulted. Offered chaplain consult but patient defers. Prozac 20 mg daily, Xanax 0.25 mg 3 times daily as needed  3/9- pt still depressed about SCI             -antipsychotic agents: N/A 5. Neuropsych/cognition: This patient is capable of making decisions on his own behalf. 6. Skin/Wound Care: Routine skin checks 7. Fluids/Electrolytes/Nutrition: Routine in and outs with follow-up chemistries 8.  End-stage renal disease.  Hemodialysis per renal services 9.  Type 2 diabetes mellitus, hemoglobin A1c 5.6.  increase semglee to 36U daily. D/c feeding supplement  3/8- Cbgs very labile- 72 to 303 in last 24 hours- cannot increased because fasting BG 72 this Am- will see if eating snacks 10.  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   Discussed with Dr. Wynetta Emery neurosurgery placed on trial of steroids.  No current intervention at this time.  Needs to f/u with NSU after d/c.   11.  Anemia of chronic disease.  Follow-up CBC  12.  Glaucoma.  Legally blind left eye.  Continue eyedrops  13.  BPH. Continue Flomax 0.4 mg daily  14.  Hyperlipidemia.  continue  Lipitor  15.  Hypertension.  Monitor with increased mobility. Reviewed and well controlled  16.  Recent cholecystitis.  Status post cholecystostomy tube placement drainage of abscess 12/16.  Follow-up outpatient  17.  Acute diarrhea.  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 Bradycardia: medications reviewed and he is not on any heart rate lowering agents  resolved  20. Poor intake: will discuss health benefits of water    LOS: 6 days A FACE TO FACE EVALUATION WAS PERFORMED  Daisja Kessinger P Janesia Joswick 12/29/2023, 1:50 PM

## 2023-12-30 DIAGNOSIS — I63512 Cerebral infarction due to unspecified occlusion or stenosis of left middle cerebral artery: Secondary | ICD-10-CM | POA: Diagnosis not present

## 2023-12-30 LAB — IRON AND TIBC
Iron: 126 ug/dL (ref 45–182)
Saturation Ratios: 44 % — ABNORMAL HIGH (ref 17.9–39.5)
TIBC: 288 ug/dL (ref 250–450)
UIBC: 162 ug/dL

## 2023-12-30 LAB — GLUCOSE, CAPILLARY
Glucose-Capillary: 62 mg/dL — ABNORMAL LOW (ref 70–99)
Glucose-Capillary: 141 mg/dL — ABNORMAL HIGH (ref 70–99)
Glucose-Capillary: 68 mg/dL — ABNORMAL LOW (ref 70–99)
Glucose-Capillary: 89 mg/dL (ref 70–99)
Glucose-Capillary: 277 mg/dL — ABNORMAL HIGH (ref 70–99)
Glucose-Capillary: 313 mg/dL — ABNORMAL HIGH (ref 70–99)

## 2023-12-30 LAB — FERRITIN: Ferritin: 330 ng/mL (ref 24–336)

## 2023-12-30 LAB — HEPATITIS B SURFACE ANTIBODY, QUANTITATIVE: Hep B S AB Quant (Post): 92.5 m[IU]/mL

## 2023-12-30 MED ORDER — INSULIN GLARGINE 100 UNIT/ML ~~LOC~~ SOLN
34.0000 [IU] | Freq: Every day | SUBCUTANEOUS | Status: DC
  Administered 2023-12-31: 34 [IU] via SUBCUTANEOUS
  Filled 2023-12-30 (×2): qty 0.34

## 2023-12-30 MED ORDER — AMLODIPINE BESYLATE 5 MG PO TABS
5.0000 mg | ORAL_TABLET | Freq: Every day | ORAL | Status: DC
  Administered 2023-12-31 – 2024-01-08 (×4): 5 mg via ORAL
  Filled 2023-12-30 (×10): qty 1

## 2023-12-30 NOTE — Progress Notes (Addendum)
 [facility] Progress Note   Subjective: Patient states dialysis went well but he does not feel well today.  Having some diarrhea and dizziness.  Otherwise no complaints.  Objective Vitals:   12/30/23 0451 12/30/23 0451 12/30/23 0828 12/30/23 0919  BP:  131/70 (!) 171/78 130/72  Pulse:  61 80   Resp:  17    Temp:  98.6 F (37 C)    TempSrc:  Oral    SpO2:  99%    Weight: 87.3 kg     Height:       Physical Exam General: Elderly man, NAD. Room air Heart: normal rate Lungs: Bilateral chest rise with no increased work of breathing Extremities: Trace edema, warm and well-perfused Dialysis Access: [facility]  Additional Objective Labs: Basic Metabolic Panel: Recent Labs  Lab 12/23/23 1225 12/26/23 0633 12/29/23 1430  NA  --  133* 127*  K  --  4.1 4.3  CL  --  99 95*  CO2  --  26 21*  GLUCOSE  --  165* 230*  BUN  --  48* 78*  CREATININE 2.31* 2.61* 3.25*  CALCIUM  --  9.0 8.5*  PHOS  --  3.9 3.9   Liver Function Tests: Recent Labs  Lab 12/26/23 0633 12/29/23 1430  ALBUMIN 3.1* 2.8*   CBC: Recent Labs  Lab 12/23/23 1225 12/26/23 0633 12/29/23 1430  WBC 8.0 9.7 9.1  HGB 9.9* 10.4* 9.1*  HCT 29.7* 31.5* 27.5*  MCV 93.1 91.6 92.6  PLT 158 162 177   Blood Culture    Component Value Date/Time   SDES  10/06/2023 1532    BILE Performed at [facility], [address] [place], [place] [address]    [facility]  10/06/2023 1532    Normal Performed at [facility], [address] [place], [place] [address]    CULT  10/06/2023 1532    No growth aerobically or anaerobically. Performed at [facility], [address], [place], [place] [address]    REPTSTATUS 10/11/2023 FINAL 10/06/2023 1532   Medications:   (feeding supplement) PROSource Plus  30 mL Oral BID BM   amLODipine  5 mg Oral Daily   aspirin  81 mg Oral Daily   atorvastatin  40 mg Oral Daily   Chlorhexidine Gluconate Cloth  6 each Topical Q12H   cholecalciferol  1,000 Units Oral  Daily   cycloSPORINE  1 drop Both Eyes BID   dorzolamide  1 drop Both Eyes BID   And   timolol  1 drop Both Eyes BID   FLUoxetine  20 mg Oral Daily   heparin  5,000 Units Subcutaneous Q8H   [START ON 12/31/2023] insulin glargine  34 Units Subcutaneous Daily   latanoprost  1 drop Left Eye QHS   multivitamin  1 tablet Oral QHS   senna  1 tablet Oral QODAY   tamsulosin  0.4 mg Oral Q supper   tiZANidine  2 mg Oral BID    Dialysis Orders MWF - [facility] 4hr, 350/500, EDW 86.5kg, 2K/2.5Ca, TDC, heparin 1000U/hr - Venofer 50mg  IV q Monday - Mircera IV q 4 weeks (last 2/17)   Assessment/Plan: Hx acute L lentiform nucleus CVA: Now in CIR getting PT/OT.  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 Hx cholecystitis 09/2023: S/p IR chole tube, now removed. ESRD: Continue HD on MWF schedule  HTN/volume: BP stable, mild LE edema, UF as tolerated. Anemia of ESRD: Hgb 9.1 - iron  labs today, consider esa Secondary HPTH: Ca/Phos ok - not on binders, on Vit D3. Nutrition: Alb low, continue protein supplements. Diarrhea: per primary T2DM Dispo: Pending d/c date 3/20.   12/30/2023, 10:40 AM  [facility]

## 2023-12-30 NOTE — Progress Notes (Signed)
 Hypoglycemic Event  CBG: 68  Treatment: 4 oz juice/soda  Symptoms: None  Follow-up CBG: Time:0657  CBG Result:141  Possible Reasons for Event: Unknown  Comments/MD notified:yes    Philip Benjamin

## 2023-12-30 NOTE — Progress Notes (Signed)
 PROGRESS NOTE   Subjective/Complaints: Encouraged use for imodium for loose stools ISS d/ced and glargine reduced to 34 given hypoglycemia Denies acute pain   ROS:  Pt denies SOB, abd pain, CP, and vision changes, acute pain  +malaise, +depression- situational, +diarrhea   Objective:   No results found. Recent Labs    12/29/23 1430  WBC 9.1  HGB 9.1*  HCT 27.5*  PLT 177    Recent Labs    12/29/23 1430  NA 127*  K 4.3  CL 95*  CO2 21*  GLUCOSE 230*  BUN 78*  CREATININE 3.25*  CALCIUM 8.5*     Intake/Output Summary (Last 24 hours) at 12/30/2023 1400 Last data filed at 12/30/2023 0900 Gross per 24 hour  Intake 240 ml  Output 1600 ml  Net -1360 ml         Physical Exam: Vital Signs Blood pressure 130/72, pulse 80, temperature 98.6 F (37 C), temperature source Oral, resp. rate 17, height 6\' 1"  (1.854 m), weight 87.3 kg, SpO2 99%.     General: awake, alert, appropriate, sitting up in w/c; NAD HENT: conjugate gaze; oropharynx moist CV: regular rate and rhythm; no JVD Pulmonary: CTA B/L; no W/R/R- good air movement GI: soft, NT, ND, (+)BS- more normoactive Psychiatric: appropriate- interactive Neurological: Ox3 MAS of 1+ to 2 in RLE and 1+ in LLE- no change Musculoskeletal:     Cervical back: Neck supple.     Comments: RUE- biceps 4+/5, Triceps 4+/5; WE 5-/5; Grip 4/5; FA 4-/5 LUE- Biceps, triceps 5-/5; WE 5-/5; Grip 4+/5; FA 4/5 RLE- HF 4-/5; otherwise 4/5 rest of RLE LLE- 4+/5 throughout , stable 3/6 Skin:    General: Skin is warm and dry.     Comments: R chest HD catheter- [place] on coccyx  Neurological:     Mental Status: He is oriented to person, place, and time.     Comments: Patient is alert.  Answers basic questions.  Limited medical historian.  Follows simple commands MAS of 2 in RLE- few beats clonus B/L LE's and MAS of 1+ in LLE (+) hoffman's B/L 3+ DTRs B/L patella, stable  3/11 Psychiatric:        Mood and Affect: Mood normal.        Behavior: Behavior normal.     Assessment/Plan: 1. Functional deficits which require 3+ hours per day of interdisciplinary therapy in a comprehensive inpatient rehab setting. Physiatrist is providing close team supervision and 24 hour management of active medical problems listed below. Physiatrist and rehab team continue to assess barriers to discharge/monitor patient progress toward functional and medical goals  Care Tool:  Bathing    Body parts bathed by patient: Right arm, Chest, Abdomen, Front perineal area, Right upper leg, Left upper leg, Face     Body parts n/a: Left arm, Buttocks, Left lower leg, Right lower leg   Bathing assist Assist Level: Maximal Assistance - Patient 24 - 49% (Per OT Eval)     Upper Body Dressing/Undressing Upper body dressing   What is the patient wearing?: Pull over shirt    Upper body assist Assist Level: Moderate Assistance - Patient 50 - 74%    Lower  Body Dressing/Undressing Lower body dressing      What is the patient wearing?: Pants, Underwear/pull up     Lower body assist Assist for lower body dressing: Maximal Assistance - Patient 25 - 49%     Toileting Toileting    Toileting assist Assist for toileting: Maximal Assistance - Patient 25 - 49%     Transfers Chair/bed transfer  Transfers assist     Chair/bed transfer assist level: Minimal Assistance - Patient > 75%     Locomotion Ambulation   Ambulation assist      Assist level: Moderate Assistance - Patient 50 - 74% Assistive device: Walker-rolling Max distance: 95ft   Walk 10 feet activity   Assist     Assist level: Moderate Assistance - Patient - 50 - 74% Assistive device: Walker-rolling   Walk 50 feet activity   Assist Walk 50 feet with 2 turns activity did not occur: Safety/medical concerns         Walk 150 feet activity   Assist Walk 150 feet activity did not occur:  Safety/medical concerns         Walk 10 feet on uneven surface  activity   Assist Walk 10 feet on uneven surfaces activity did not occur: Safety/medical concerns         Wheelchair     Assist Is the patient using a wheelchair?: Yes Type of Wheelchair: Manual    Wheelchair assist level: Supervision/Verbal cueing Max wheelchair distance: 150'    Wheelchair 50 feet with 2 turns activity    Assist        Assist Level: Supervision/Verbal cueing   Wheelchair 150 feet activity     Assist      Assist Level: Supervision/Verbal cueing   Blood pressure 130/72, pulse 80, temperature 98.6 F (37 C), temperature source Oral, resp. rate 17, height 6\' 1"  (1.854 m), weight 87.3 kg, SpO2 99%.  Medical Problem List and Plan: 1. 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  Antithrombotics: -DVT/anticoagulation:  Pharmaceutical: Heparin             -antiplatelet therapy: Aspirin 81 mg daily 3. Pain Management: Tylenol as needed  3/9- no pain 4. Situational depression:  Neuropsych consulted. Offered chaplain consult but patient defers. Prozac 20 mg daily, Xanax 0.25 mg 3 times daily as needed  3/9- pt still depressed about SCI             -antipsychotic agents: N/A 5. Neuropsych/cognition: This patient is capable of making decisions on his own behalf. 6. Skin/Wound Care: Routine skin checks 7. Fluids/Electrolytes/Nutrition: Routine in and outs with follow-up chemistries 8.  End-stage renal disease.  Hemodialysis per renal services 9.  Type 2 diabetes mellitus, hemoglobin A1c 5.6.  decrease semglee to 34U. D/c feeding supplement   10.  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  Discussed with Dr. Wynetta Emery neurosurgery placed on trial of steroids.  No current intervention at this time.  Needs to f/u with NSU  after d/c.   11.  Anemia of chronic disease.  Follow-up CBC  12.  Glaucoma.  Legally blind left eye.  Continue eyedrops  13.  BPH. Continue Flomax 0.4 mg daily  14.  Hyperlipidemia.  Continue Lipitor  15.  Hypertension.  Monitor with increased mobility. Reviewed and well controlled  16.  Recent cholecystitis.  Status post cholecystostomy tube placement drainage of abscess 12/16.  Follow-up outpatient  17.  Acute diarrhea.  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 Bradycardia: medications reviewed and he is not on any heart rate lowering agents  resolved  20. Poor intake: will discuss health benefits of water    LOS: 7 days A FACE TO FACE EVALUATION WAS PERFORMED  Philip Benjamin 12/30/2023, 2:00 PM

## 2023-12-30 NOTE — Progress Notes (Signed)
 Physical Therapy Session Note  Patient Details  Name: Philip Benjamin MRN: [id number] Date of Birth: 01/07/1951  Today's Date: 12/30/2023 PT Individual Time: 1100-1157 PT Individual Time Calculation (min): 57 min   Short Term Goals: Week 1:  PT Short Term Goal 1 (Week 1): Pt will complete bed mobility with minA without hospial bed features PT Short Term Goal 2 (Week 1): Pt will complete bed<>chair transfers with CGA and LRAD PT Short Term Goal 3 (Week 1): Pt will ambulate 73ft with minA and LRAD PT Short Term Goal 4 (Week 1): Pt will initiate stair training PT Short Term Goal 5 (Week 1): Pt will improve BERG balance score by at least 8 points  Skilled Therapeutic Interventions/Progress Updates:      Pt sitting in wheelchair to start. Has no complaints of acute pain but reports feeling fatigued and weak from having frequent loose BM's. Pt reports his nurse is aware of the situation. Pt still motiviated to work in therapy but requests lighter activity and to avoid walking/straining.   Transported at wheelchair level to main rehab gym. Completed stand pivot transfer with minA and no AD or UE support from w/c to mat table - has posterior bias with weight on heels. Lacks awareness of BOS prior to standing unless prompted.   Positioned into supine position to work on BLE strengthening: -2x10 alternating knee-to-chest in supine -2x10 alternating hip abd/add -2x10 alternating SLR -2x20 glut sets -2x20 ankle pumps -long sitting active hamstring stretching -attempted prone position to work on hip flexor stretching and prone there-ex but patient unable to tolerate 2/2 upset stomach.   *Pt significant having difficulty with bed repositioning at mat table. Such as transitioning from prone to supine, supine to long sitting, forward scooting in long sitting position, etc. Weak core and R sided weakness affecting this functional mobility.   Returned to patients room and patient left sitting  upright at wheelchair level. All his needs met. NT present for routine BG testing.     Therapy Documentation Precautions:  Precautions Precautions: Fall Precaution/Restrictions Comments: R hemi Restrictions Weight Bearing Restrictions Per Provider Order: No Other Position/Activity Restrictions: r hemiparesis General:    Therapy/Group: Individual Therapy  Joliet Mallozzi P Rahmel Nedved 12/30/2023, 11:19 AM

## 2023-12-30 NOTE — Plan of Care (Signed)
  Problem: Consults Goal: RH STROKE PATIENT EDUCATION Description: See Patient Education module for education specifics  Outcome: Progressing   Problem: RH BOWEL ELIMINATION Goal: RH STG MANAGE BOWEL WITH ASSISTANCE Description: STG Manage Bowel with mod I Assistance. Outcome: Progressing   Problem: RH BLADDER ELIMINATION Goal: RH STG MANAGE BLADDER WITH ASSISTANCE Description: STG Manage Bladder With min Assistance Outcome: Progressing   Problem: RH SAFETY Goal: RH STG ADHERE TO SAFETY PRECAUTIONS W/ASSISTANCE/DEVICE Description: STG Adhere to Safety Precautions With cues  Assistance/Device. Outcome: Progressing   Problem: RH PAIN MANAGEMENT Goal: RH STG PAIN MANAGED AT OR BELOW PT'S PAIN GOAL Description: < 4 with prns Outcome: Progressing   Problem: RH KNOWLEDGE DEFICIT Goal: RH STG INCREASE KNOWLEDGE OF DIABETES Description: Patient and family will be able to manage DM using educational resources for medications and dietary modification independently Outcome: Progressing Goal: RH STG INCREASE KNOWLEDGE OF HYPERTENSION Description: Patient and family will be able to manage HTN using educational resources for medications and dietary modification independently Outcome: Progressing Goal: RH STG INCREASE KNOWLEGDE OF HYPERLIPIDEMIA Description: Patient and family will be able to manage HLD using educational resources for medications and dietary modification independently Outcome: Progressing Goal: RH STG INCREASE KNOWLEDGE OF STROKE PROPHYLAXIS Description: Patient and family will be able to manage secondary risks using educational resources for medications and dietary modification independently Outcome: Progressing

## 2023-12-30 NOTE — Progress Notes (Signed)
 Hypoglycemic Event  CBG: 62  Treatment: 4 oz juice  Symptoms: None  Follow-up CBG: Time:0630 CBG Result:68  Possible Reasons for Event: Unknown  Comments/MD notified:yes    Philip Benjamin

## 2023-12-30 NOTE — Progress Notes (Signed)
 Occupational Therapy Session Note  Patient Details  Name: Philip Benjamin MRN: [id number] Date of Birth: [birth year]  Today's Date: 12/30/2023 OT Individual Time: 7829-5621 OT Individual Time Calculation (min): 72 min    Short Term Goals: Week 1:  OT Short Term Goal 1 (Week 1): Patient will don pull over shirt over head with min assist OT Short Term Goal 2 (Week 1): Patient will don pants over feet with min assist OT Short Term Goal 3 (Week 1): Patient will pull pants up/down over hips with min assist OT Short Term Goal 4 (Week 1): Patient will complete stand step toilet transfer with close supervision OT Short Term Goal 5 (Week 1): Patient will feed himself with intermittent min assist following set up  Skilled Therapeutic Interventions/Progress Updates:     Pt received siting up in wc finishing up his lunch presenting to be fatigued, however in good spirits receptive to skilled OT session reporting 0/10 pain- OT offering intermittent rest breaks, repositioning, and therapeutic support to optimize participation in therapy session. Pt reporting he has had increase loose BMs today and that he feels weak, however motivated to participate in therapy. Focused this session on BADL retraining, functional transfers, and B UE strengthening.    Pt requesting to brush teeth and complete grooming/hygiene tasks at sink at beginning of session. Positioned Pt at sink in wc for energy conservation during grooming/hygiene activities. Pt required supervision +increased time to brush teeth with verbal cues required to locate toothpaste/tooth brush on sink d/t baseline visual deficits- Pt mild perseverating during task requiring >7 minutes to brush teeth and verbal cues required to initiate cleaning out mouth following. Pt able to wash face, groom hair, and apply lotion with supervision +increased time.  Transported Pt total A to therapy gym in wc for time management and energy conservation.   Stand pivot wc >  EOM using RW to the L heavy min A for balance, R LE placement, and lateral weight shifting with verbal cues provided for R LE positioning and RW management. Sitting EOM, engaged Pt in completing the following exercises to increase overall strength and activity tolerance for BADLs with functional movements such as posterior reaching, lateral weight shifting, and trunk flexion/extension incorporated into tasks to simulate skills required for BADLs:  -Chest press, 4# weighted dowel 1x10 reps -Anterior shoulder flexion, 4# weighted dowel 1x10 reps -Bicep curls, 4# weighted dowel 1x10 reps -Behind the back ball passes with internal/external shoulder rotation, 3# weighted ball 1x10 reps each direction -Modified partial sit ups, 3# weighted ball 1x10 reps -Seated marches with under leg passes, 3# weighted ball 1x10 reps -Modified Russian twists, 3# weighted ball 1x10 reps Increased time for rest break provided between each exercise for energy conservation and to support participation.   Pt requesting to use restroom. Stand pivot EOM > wc using RW to R heavy min A for balance, RW management, and lateral weight shifting. Transported Pt back to room to bathroom total A in wc. Stand pivot wc > standard toilet using grab bars. Pt able to doff pants with mod A required for balance and mod verbal cues for technique. Increased time provided on toilet d/t Pt reporting need for BM, however Pt with continent void only. Sit > stand from toilet using grab bar mod A. Engaged Pt in completing posterior peri-care and clothing management with assistance provided for balance, to fully bring pants over R hip, and to ensure cleanliness. Stand pivot toilet > wc using grab bars min A +min verbal  cues for technique.   Pt was left resting in wc with call bell in reach, chair alarm on, and all needs met.    Therapy Documentation Precautions:  Precautions Precautions: Fall Precaution/Restrictions Comments: R  hemi Restrictions Weight Bearing Restrictions Per Provider Order: No Other Position/Activity Restrictions: r hemiparesis  Therapy/Group: Individual Therapy  [name] 12/30/2023, 7:57 AM

## 2023-12-30 NOTE — Plan of Care (Signed)
  Problem: RH BOWEL ELIMINATION Goal: RH STG MANAGE BOWEL WITH ASSISTANCE Description: STG Manage Bowel with mod I Assistance. Outcome: Progressing   Problem: RH BLADDER ELIMINATION Goal: RH STG MANAGE BLADDER WITH ASSISTANCE Description: STG Manage Bladder With min Assistance Outcome: Progressing   Problem: RH SAFETY Goal: RH STG ADHERE TO SAFETY PRECAUTIONS W/ASSISTANCE/DEVICE Description: STG Adhere to Safety Precautions With cues  Assistance/Device. Outcome: Progressing

## 2023-12-30 NOTE — Progress Notes (Signed)
 Physical Therapy Session Note  Patient Details  Name: Philip Benjamin MRN: [id number] Date of Birth: [birth year]  Today's Date: 12/30/2023 PT Individual Time: 1017-1100 PT Individual Time Calculation (min): 43 min   Short Term Goals: Week 1:  PT Short Term Goal 1 (Week 1): Pt will complete bed mobility with minA without hospial bed features PT Short Term Goal 2 (Week 1): Pt will complete bed<>chair transfers with CGA and LRAD PT Short Term Goal 3 (Week 1): Pt will ambulate 57ft with minA and LRAD PT Short Term Goal 4 (Week 1): Pt will initiate stair training PT Short Term Goal 5 (Week 1): Pt will improve BERG balance score by at least 8 points  Skilled Therapeutic Interventions/Progress Updates: Pt presents semi-reclined in bed and agreeable to therapy, although concerned about possibility of diarrhea.  Pt also states dizziness this AM when OOB to BR.  Knee high TEDS applied in supine, BP at 139/77.  Pt transfers sup to sit w/ elevated HOB and CGA.  Pt sits EOB w/o c/o dizziness and BP of 122/80.  Pt performs calf raises, LAQ, hip flexion.  Pants threaded over feet and then transfers sit to stand w/ min A and then maintained min A for steadying.  PT assisted w/ completion of pants donning and BP decreased to 95/68 w/o c/o dizziness or any other sx.  Pt transferred bed > w/c w/ min A and RW.  Secure chat to PA and Ok'd THT, relayed message to next PT.  Pt remained sitting in w/c and handed off to PT.     Therapy Documentation Precautions:  Precautions Precautions: Fall Precaution/Restrictions Comments: R hemi Restrictions Weight Bearing Restrictions Per Provider Order: No Other Position/Activity Restrictions: r hemiparesis General:   Vital Signs: Therapy Vitals Pulse Rate: 80 BP: 130/72 Patient Position (if appropriate): Lying Pain:0/10      Therapy/Group: Individual Therapy  Lucio Edward 12/30/2023, 11:08 AM

## 2023-12-31 ENCOUNTER — Other Ambulatory Visit: Payer: Self-pay

## 2023-12-31 DIAGNOSIS — I63512 Cerebral infarction due to unspecified occlusion or stenosis of left middle cerebral artery: Secondary | ICD-10-CM | POA: Diagnosis not present

## 2023-12-31 DIAGNOSIS — N185 Chronic kidney disease, stage 5: Secondary | ICD-10-CM

## 2023-12-31 DIAGNOSIS — N186 End stage renal disease: Secondary | ICD-10-CM

## 2023-12-31 LAB — CBC
HCT: 29.7 % — ABNORMAL LOW (ref 39.0–52.0)
Hemoglobin: 9.9 g/dL — ABNORMAL LOW (ref 13.0–17.0)
MCH: 30.7 pg (ref 26.0–34.0)
MCHC: 33.3 g/dL (ref 30.0–36.0)
MCV: 92.2 fL (ref 80.0–100.0)
Platelets: 199 10*3/uL (ref 150–400)
RBC: 3.22 MIL/uL — ABNORMAL LOW (ref 4.22–5.81)
RDW: 11.9 % (ref 11.5–15.5)
WBC: 14.9 10*3/uL — ABNORMAL HIGH (ref 4.0–10.5)
nRBC: 0 % (ref 0.0–0.2)

## 2023-12-31 LAB — RENAL FUNCTION PANEL
Albumin: 3 g/dL — ABNORMAL LOW (ref 3.5–5.0)
Anion gap: 8 (ref 5–15)
BUN: 50 mg/dL — ABNORMAL HIGH (ref 8–23)
CO2: 24 mmol/L (ref 22–32)
Calcium: 9 mg/dL (ref 8.9–10.3)
Chloride: 99 mmol/L (ref 98–111)
Creatinine, Ser: 2.81 mg/dL — ABNORMAL HIGH (ref 0.61–1.24)
GFR, Estimated: 23 mL/min — ABNORMAL LOW (ref >60)
Glucose, Bld: 111 mg/dL — ABNORMAL HIGH (ref 70–99)
Phosphorus: 3 mg/dL (ref 2.5–4.6)
Potassium: 4 mmol/L (ref 3.5–5.1)
Sodium: 131 mmol/L — ABNORMAL LOW (ref 135–145)

## 2023-12-31 LAB — GLUCOSE, CAPILLARY
Glucose-Capillary: 186 mg/dL — ABNORMAL HIGH (ref 70–99)
Glucose-Capillary: 68 mg/dL — ABNORMAL LOW (ref 70–99)
Glucose-Capillary: 48 mg/dL — ABNORMAL LOW (ref 70–99)
Glucose-Capillary: 113 mg/dL — ABNORMAL HIGH (ref 70–99)

## 2023-12-31 MED ORDER — HEPARIN SODIUM (PORCINE) 1000 UNIT/ML IJ SOLN
3800.0000 [IU] | Freq: Once | INTRAMUSCULAR | Status: DC
  Filled 2023-12-31: qty 4

## 2023-12-31 MED ORDER — LIDOCAINE-PRILOCAINE 2.5-2.5 % EX CREA: 1.0000 | TOPICAL_CREAM | CUTANEOUS | Status: DC | PRN

## 2023-12-31 MED ORDER — HEPARIN SODIUM (PORCINE) 1000 UNIT/ML IJ SOLN
2000.0000 [IU] | Freq: Once | INTRAMUSCULAR | Status: AC
  Administered 2023-12-31: 2000 [IU] via INTRAVENOUS

## 2023-12-31 MED ORDER — ALTEPLASE 2 MG IJ SOLR: 2.0000 mg | Freq: Once | INTRAMUSCULAR | Status: DC | PRN

## 2023-12-31 MED ORDER — TIZANIDINE HCL 4 MG PO TABS
2.0000 mg | ORAL_TABLET | Freq: Every day | ORAL | Status: DC
  Administered 2024-01-01 – 2024-01-06 (×6): 2 mg via ORAL
  Filled 2023-12-31 (×6): qty 1

## 2023-12-31 MED ORDER — INSULIN GLARGINE 100 UNIT/ML ~~LOC~~ SOLN
33.0000 [IU] | Freq: Every day | SUBCUTANEOUS | Status: DC
  Filled 2023-12-31: qty 0.33

## 2023-12-31 MED ORDER — ANTICOAGULANT SODIUM CITRATE 4% (200MG/5ML) IV SOLN: 5.0000 mL | Status: DC | PRN

## 2023-12-31 MED ORDER — DARBEPOETIN ALFA 60 MCG/0.3ML IJ SOSY
60.0000 ug | PREFILLED_SYRINGE | INTRAMUSCULAR | Status: DC
  Filled 2023-12-31: qty 0.3

## 2023-12-31 MED ORDER — HEPARIN SODIUM (PORCINE) 1000 UNIT/ML DIALYSIS: 1000.0000 [IU] | INTRAMUSCULAR | Status: DC | PRN

## 2023-12-31 MED ORDER — PENTAFLUOROPROP-TETRAFLUOROETH EX AERO: 1.0000 | INHALATION_SPRAY | CUTANEOUS | Status: DC | PRN

## 2023-12-31 MED ORDER — LIDOCAINE HCL (PF) 1 % IJ SOLN: 5.0000 mL | INTRAMUSCULAR | Status: DC | PRN

## 2023-12-31 NOTE — Plan of Care (Signed)
  Problem: RH BOWEL ELIMINATION Goal: RH STG MANAGE BOWEL WITH ASSISTANCE Description: STG Manage Bowel with mod I Assistance. Outcome: Progressing   Problem: RH BLADDER ELIMINATION Goal: RH STG MANAGE BLADDER WITH ASSISTANCE Description: STG Manage Bladder With min Assistance Outcome: Progressing   Problem: RH SAFETY Goal: RH STG ADHERE TO SAFETY PRECAUTIONS W/ASSISTANCE/DEVICE Description: STG Adhere to Safety Precautions With cues  Assistance/Device. Outcome: Progressing

## 2023-12-31 NOTE — Progress Notes (Signed)
 Occupational Therapy Weekly Progress Note  Patient Details  Name: Philip Benjamin MRN: [id number] Date of Birth: 05-16-1951  Beginning of progress report period: December 24, 2023 End of progress report period: December 31, 2023  Today's Date: 12/31/2023 OT Individual Time: 7829-5621 OT Individual Time Calculation (min): 59 min    Patient has met 4 of 5 short term goals. Philip Benjamin is making appropriate progress towards reaching his LTGs. He is completing UB bathing/dressing with min A with assistance required for buttons and washing L underarm, LB bathing/dressing mod A with increased independence with pulling pants up/down and weaving B LEs into pants, grooming/hygiene tasks with min A d/t FM deficits, and stand pivot transfers to the toilet using RW or grab bars with CGA to occasional min A if fatigued.   Patient continues to demonstrate the following deficits: muscle weakness and muscle joint tightness, decreased cardiorespiratoy endurance, unbalanced muscle activation, decreased visual perceptual skills, decreased visual acuity, and decreased sitting balance, decreased standing balance, decreased postural control, hemiplegia, and decreased balance strategies and therefore will continue to benefit from skilled OT intervention to enhance overall performance with BADL and Reduce care partner burden.  Patient progressing toward long term goals..  Continue plan of care.  OT Short Term Goals Week 1:  OT Short Term Goal 1 (Week 1): Patient will don pull over shirt over head with min assist OT Short Term Goal 1 - Progress (Week 1): Met OT Short Term Goal 2 (Week 1): Patient will don pants over feet with min assist OT Short Term Goal 2 - Progress (Week 1): Met OT Short Term Goal 3 (Week 1): Patient will pull pants up/down over hips with min assist OT Short Term Goal 3 - Progress (Week 1): Met OT Short Term Goal 4 (Week 1): Patient will complete stand step toilet transfer with close supervision OT  Short Term Goal 4 - Progress (Week 1): Progressing toward goal OT Short Term Goal 5 (Week 1): Patient will feed himself with intermittent min assist following set up OT Short Term Goal 5 - Progress (Week 1): Met Week 2:  OT Short Term Goal 1 (Week 2): Patient will complete stand step toilet transfer with close supervision OT Short Term Goal 2 (Week 2): Pt will pull pants up/down over hips with CGA usign LRAD OT Short Term Goal 3 (Week 2): Pt will donn pull over head shirt with supervision consistently OT Short Term Goal 4 (Week 2): Pt tolerate standing >2 minutes during BADLs or functional activity using LRAD CGA  Skilled Therapeutic Interventions/Progress Updates:     Pt received reclined in bed presenting to be in good spirits receptive to skilled OT session reporting 0/10 pain- OT offering intermittent rest breaks, repositioning, and therapeutic support to optimize participation in therapy session. Pt reporting his vision is not good today 2/2 glaucoma- opened blinds in room and increased lighting to facilitate improved vision during session. RN in/out to provide morning medications and eyes drops. Pt requesting to wash up and use restroom this AM- focused this session on BADL retraining and functional transfers to increase overall independence and decrease bourdon of care.   Pt transitioned to EOB with HOB elevated supervision. Stand pivot EOB > wc to R using RW min A for balance and RW management with verbal cues required to facilitate increased anterior weight shifting and for R foot placement.   Transported Pt to bathroom in wc for energy conservation. Stand pivot using grab bars wc > BSC placed over toilet min  A for balance and min verbal cues for technique. Pt able to doff dirty brief with min A provided for balance alternating using R/LE to pull brief down with opposite hand supported on grab bar. Continent void documented in flow sheets. Stand pivot BSC place over toilet > wc to R using grab  bar min A for balance and min verbal cues for technique.   Positioned Pt at sink to wash-up vs completing full shower 2/2 dialysis port. Engaged Pt in U/LB bathing seated at sink with education provided on hemi-techniques and modifications for grasping wash or using wash mit to increase Pt's independence with Pt receptive to education. Pt able to complete UB bathing with min A this session with assistance required for washing L underarm. Pt sat to complete majority of LB bathing and stood with B UEs supported on sink to wash buttocks and peri-areas. Pt able to maintain standing balance with min A while reaching posteriorly to wash buttocks with A required to ensure cleanliness.   Pt able to donn pull over shirt with supervision +increased time and mod verbal cues this session and donn button up shirt with mod a required to weave L UE and to fasten buttons. Pt able to weave B LEs into pants with min A required to fully adjust pants over feet d/t tight fit of jogger pants and stand while holding sink to bring pants to waist with min A. Pt able to alternating using R/L UE to pull up pants with assistance required to fully adjust pants over R hip.   Pt completed grooming/hygiene tasks of oral care seated in wc with increased time +mod verbal cues for orientation of toothbrush and toothpaste d/t visual deficits. Pt was handed off to PT with all needs met.    Therapy Documentation Precautions:  Precautions Precautions: Fall Precaution/Restrictions Comments: R hemi Restrictions Weight Bearing Restrictions Per Provider Order: No Other Position/Activity Restrictions: r hemiparesis  Therapy/Group: Individual Therapy  [name] 12/31/2023, 8:02 AM

## 2023-12-31 NOTE — Inpatient Diabetes Management (Signed)
 Inpatient Diabetes Program Recommendations  AACE/ADA: New Consensus Statement on Inpatient Glycemic Control   Target Ranges:  Prepandial:   less than 140 mg/dL      Peak postprandial:   less than 180 mg/dL (1-2 hours)      Critically ill patients:  140 - 180 mg/dL    Latest Reference Range & Units 12/30/23 06:00 12/30/23 06:30 12/30/23 06:57 12/30/23 11:52 12/30/23 16:52 12/30/23 20:57 12/31/23 06:07 12/31/23 11:56  Glucose-Capillary 70 - 99 mg/dL 62 (L) 68 (L) 829 (H) 89 277 (H) 313 (H) 186 (H) 68 (L)   Review of Glycemic Control  Diabetes history: DM2 Outpatient Diabetes medications: Lantus 26 units daily Current orders for Inpatient glycemic control: Lantus 34 units daily  Inpatient Diabetes Program Recommendations:    Insulin: Please consider decreasing Lantus to 28 units daily and ordering CBGs AC&HS with Novolog 0-9 units TID with meals and Novolog 0-5 units at bedtime.  NOTE: Last Decadron given on 12/29/23 at 7:50 am. CBG 62 mg/dl on 5/62/13 and down to 68 mg/dl at 08:65 am today.  Thanks Orlando Penner, RN, MSN, CDCES Diabetes Coordinator Inpatient Diabetes Program [phone] (Team Pager from 8am to 5pm)

## 2023-12-31 NOTE — Progress Notes (Signed)
 PROGRESS NOTE   Subjective/Complaints: CBGs elevated to 300s today, ISS stopped yesterday due to CBGs down to 62, d/c prosource and ensure and educated patient regarding eating 3 meals per day only   ROS:  Pt denies SOB, abd pain, CP, and vision changes, acute pain  +malaise, +depression- situational, +diarrhea   Objective:   No results found. Recent Labs    12/29/23 1430 12/31/23 1110  WBC 9.1 14.9*  HGB 9.1* 9.9*  HCT 27.5* 29.7*  PLT 177 199    Recent Labs    12/29/23 1430 12/31/23 1110  NA 127* 131*  K 4.3 4.0  CL 95* 99  CO2 21* 24  GLUCOSE 230* 111*  BUN 78* 50*  CREATININE 3.25* 2.81*  CALCIUM 8.5* 9.0     Intake/Output Summary (Last 24 hours) at 12/31/2023 1528 Last data filed at 12/31/2023 0900 Gross per 24 hour  Intake 240 ml  Output 1100 ml  Net -860 ml         Physical Exam: Vital Signs Blood pressure 132/62, pulse 67, temperature 98.6 F (37 C), temperature source Oral, resp. rate 12, height 6\' 1"  (1.854 m), weight 85.6 kg, SpO2 99%.     General: awake, alert, appropriate, sitting up in w/c; NAD HENT: conjugate gaze; oropharynx moist CV: regular rate and rhythm; no JVD Pulmonary: CTA B/L; no W/R/R- good air movement GI: soft, NT, ND, (+)BS- more normoactive Psychiatric: appropriate- interactive Neurological: Ox3 MAS of 1+ to 2 in RLE and 1+ in LLE- no change Musculoskeletal:     Cervical back: Neck supple.     Comments: RUE- biceps 4+/5, Triceps 4+/5; WE 5-/5; Grip 4/5; FA 4-/5 LUE- Biceps, triceps 5-/5; WE 5-/5; Grip 4+/5; FA 4/5 RLE- HF 4-/5; otherwise 4/5 rest of RLE LLE- 4+/5 throughout , stable 3/6 Skin:    General: Skin is warm and dry.     Comments: R chest HD catheter- [place] on coccyx  Neurological:     Mental Status: He is oriented to person, place, and time.     Comments: Patient is alert.  Answers basic questions.  Limited medical historian.  Follows simple  commands MAS of 2 in RLE- few beats clonus B/L LE's and MAS of 1+ in LLE (+) hoffman's B/L 3+ DTRs B/L patella, stable 3/12 Psychiatric:        Mood and Affect: Mood normal.        Behavior: Behavior normal.     Assessment/Plan: 1. Functional deficits which require 3+ hours per day of interdisciplinary therapy in a comprehensive inpatient rehab setting. Physiatrist is providing close team supervision and 24 hour management of active medical problems listed below. Physiatrist and rehab team continue to assess barriers to discharge/monitor patient progress toward functional and medical goals  Care Tool:  Bathing    Body parts bathed by patient: Right arm, Chest, Abdomen, Front perineal area, Right upper leg, Left upper leg, Face   Body parts bathed by helper: Buttocks, Left arm, Left lower leg, Right lower leg Body parts n/a: Left arm, Buttocks, Left lower leg, Right lower leg   Bathing assist Assist Level: Moderate Assistance - Patient 50 - 74%     Upper  Body Dressing/Undressing Upper body dressing   What is the patient wearing?: Pull over shirt    Upper body assist Assist Level: Minimal Assistance - Patient > 75%    Lower Body Dressing/Undressing Lower body dressing      What is the patient wearing?: Pants, Underwear/pull up     Lower body assist Assist for lower body dressing: Moderate Assistance - Patient 50 - 74%     Toileting Toileting    Toileting assist Assist for toileting: Moderate Assistance - Patient 50 - 74%     Transfers Chair/bed transfer  Transfers assist     Chair/bed transfer assist level: Minimal Assistance - Patient > 75%     Locomotion Ambulation   Ambulation assist      Assist level: Moderate Assistance - Patient 50 - 74% Assistive device: Walker-rolling Max distance: 48ft   Walk 10 feet activity   Assist     Assist level: Moderate Assistance - Patient - 50 - 74% Assistive device: Walker-rolling   Walk 50 feet  activity   Assist Walk 50 feet with 2 turns activity did not occur: Safety/medical concerns         Walk 150 feet activity   Assist Walk 150 feet activity did not occur: Safety/medical concerns         Walk 10 feet on uneven surface  activity   Assist Walk 10 feet on uneven surfaces activity did not occur: Safety/medical concerns         Wheelchair     Assist Is the patient using a wheelchair?: Yes Type of Wheelchair: Manual    Wheelchair assist level: Supervision/Verbal cueing Max wheelchair distance: 150'    Wheelchair 50 feet with 2 turns activity    Assist        Assist Level: Supervision/Verbal cueing   Wheelchair 150 feet activity     Assist      Assist Level: Supervision/Verbal cueing   Blood pressure 132/62, pulse 67, temperature 98.6 F (37 C), temperature source Oral, resp. rate 12, height 6\' 1"  (1.854 m), weight 85.6 kg, SpO2 99%.  Medical Problem List and Plan: 1. 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  Antithrombotics: -DVT/anticoagulation:  Pharmaceutical: Heparin             -antiplatelet therapy: Aspirin 81 mg daily 3. Pain Management: Tylenol as needed  3/9- no pain 4. Situational depression:  Neuropsych consulted. Offered chaplain consult but patient defers. Prozac 20 mg daily, Xanax 0.25 mg 3 times daily as needed  3/9- pt still depressed about SCI             -antipsychotic agents: N/A 5. Neuropsych/cognition: This patient is capable of making decisions on his own behalf. 6. Skin/Wound Care: Routine skin checks 7. Fluids/Electrolytes/Nutrition: Routine in and outs with follow-up chemistries 8.  End-stage renal disease.  Hemodialysis per renal services 9.  Type 2 diabetes mellitus, hemoglobin A1c 5.6. Decrease Glargine to 33U. D/c feeding supplement   10.  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  Discussed with Dr. Wynetta Emery neurosurgery placed on trial of steroids.  No current intervention at this time.  Needs to f/u with NSU after d/c.   11.  Anemia of chronic disease.  Follow-up CBC  12.  Glaucoma.  Legally blind left eye.  Continue eyedrops  13.  BPH. Continue Flomax 0.4 mg daily  14.  Hyperlipidemia.  Continue Lipitor  15.  Hypertension.  Monitor with increased mobility. Reviewed and well controlled  16.  Recent cholecystitis.  Status post cholecystostomy tube placement drainage of abscess 12/16.  Follow-up outpatient  17.  Acute diarrhea.    continue imodium  18. 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 Bradycardia: medications reviewed and he is not on any heart rate lowering agents  resolved  20. Poor intake: recommended eating fruits since these are filled with fiber and will bulk his stool  21. Leukocytosis: repeat tomorrow to trend    LOS: 8 days A FACE TO FACE EVALUATION WAS PERFORMED  Drema Pry Junious Ragone 12/31/2023, 3:28 PM

## 2023-12-31 NOTE — Consult Note (Addendum)
 Hospital Consult    Reason for Consult:  dialysis access Requesting Physician:  Dr. Dalene Carrow MRN #:  [id number]  History of Present Illness: This is a 73 y.o. male being seen in consultation for evaluation for dialysis access.  He was initiated on HD in October 2024.  He has been dialyzing via right IJ TDC at [facility] in [place].  He is right arm dominant and would prefer access placement in the left arm.  He has not had any prior access surgery.  He is currently in inpatient rehabilitation for recent stroke leaving him with a right leg deficit.  He has not had vein mapping.  He denies tobacco use.  He does not take any blood thinners and does not have a pacemaker.  He was scheduled to be seen in the office on 01/06/24 however will be in rehab until 01/08/2024.  Past Medical History:  Diagnosis Date   BPH (benign prostatic hyperplasia)    Chronic kidney disease    ckd stage 3   Diabetes mellitus without complication (HCC)    Hypertension     Past Surgical History:  Procedure Laterality Date   EYE SURGERY     INGUINAL HERNIA REPAIR Bilateral 01/23/2018   Procedure: BILATERAL OPEN INGUINAL HERNIA REPAIR WITH MESH;  Surgeon: Jimmye Norman, MD;  Location: [facility] OR;  Service: General;  Laterality: Bilateral;   INSERTION OF MESH Bilateral 01/23/2018   Procedure: INSERTION OF MESH;  Surgeon: Jimmye Norman, MD;  Location: [facility] OR;  Service: General;  Laterality: Bilateral;   IR FLUORO GUIDE CV LINE RIGHT  08/15/2023   IR PATIENT EVAL TECH 0-60 MINS  11/18/2023   IR PERC CHOLECYSTOSTOMY  10/06/2023   IR US GUIDE BX ASP/DRAIN  10/06/2023   IR US GUIDE VASC ACCESS RIGHT  08/15/2023   VASECTOMY     ~1985    No Known Allergies  Prior to Admission medications   Medication Sig Start Date End Date Taking? Authorizing Provider  acetaminophen (TYLENOL) 650 MG CR tablet Take 650 mg by mouth every 6 (six) hours as needed for pain.    [provider]  ALPRAZolam Prudy Feeler) 0.25 MG tablet Take 1 tablet (0.25 mg  total) by mouth 3 (three) times daily as needed for anxiety. 12/23/23   Sherryll Burger, Pratik D, DO  atorvastatin (LIPITOR) 20 MG tablet Take 1 tablet by mouth daily. 12/06/23 01/05/24  [provider]  Cholecalciferol 125 MCG (5000 UT) capsule Take by mouth.    [provider]  cycloSPORINE (RESTASIS) 0.05 % ophthalmic emulsion SMARTSIG:In Eye(s) 08/13/23   [provider]  dexamethasone (DECADRON) 4 MG tablet Take 1 tablet (4 mg total) by mouth every 12 (twelve) hours for 5 days, THEN 1 tablet (4 mg total) daily for 5 days. 12/23/23 01/02/24  Sherryll Burger, Pratik D, DO  Dorzolamide HCl-Timolol Mal PF 2-0.5 % SOLN Apply 1 drop to eye 2 (two) times daily. 08/18/23   [provider]  Fluoxetine HCl, PMDD, 20 MG TABS Take 1 tablet by mouth daily. 12/07/23 01/06/24  [provider]  folic acid (FOLVITE) 800 MCG tablet Take 800 mcg by mouth daily.    [provider]  LANTUS 100 UNIT/ML injection Inject 26 Units into the skin daily.    [provider]  latanoprost (XALATAN) 0.005 % ophthalmic solution Place 1 drop into both eyes at bedtime. Patient taking differently: Place 1 drop into the left eye at bedtime. 08/25/23   Catarina Hartshorn, MD  loperamide (IMODIUM) 2 MG capsule Take 1  capsule (2 mg total) by mouth as needed for diarrhea or loose stools. 12/23/23   Sherryll Burger, Pratik D, DO  multivitamin (RENA-VIT) TABS tablet Take 1 tablet by mouth at bedtime. 08/20/23   Catarina Hartshorn, MD  Nutritional Supplements (FEEDING SUPPLEMENT, NEPRO CARB STEADY,) LIQD Take 237 mLs by mouth 2 (two) times daily between meals. 12/23/23   Sherryll Burger, Pratik D, DO  nystatin cream (MYCOSTATIN) Apply 1 application topically 2 (two) times daily as needed. FOR YEAST OR SKIN RASHES. 12/16/17   [provider]  ondansetron (ZOFRAN) 4 MG tablet Take by mouth 2 (two) times daily. 12/06/23   [provider]  tamsulosin (FLOMAX) 0.4 MG CAPS capsule Take 0.4 mg by mouth daily with supper. 12/16/17    [provider]    Social History   Socioeconomic History   Marital status: Married    Spouse name: Not on file   Number of children: Not on file   Years of education: Not on file   Highest education level: Not on file  Occupational History   Not on file  Tobacco Use   Smoking status: Never   Smokeless tobacco: Never  Substance and Sexual Activity   Alcohol use: Never   Drug use: Never   Sexual activity: Not on file  Other Topics Concern   Not on file  Social History Narrative   Not on file   Social Drivers of Health   Financial Resource Strain: Not on file  Food Insecurity: Patient Unable To Answer (12/26/2023)   Hunger Vital Sign    Worried About Running Out of Food in the Last Year: Patient unable to answer    Ran Out of Food in the Last Year: Patient unable to answer  Transportation Needs: Patient Unable To Answer (12/26/2023)   PRAPARE - Transportation    Lack of Transportation (Medical): Patient unable to answer    Lack of Transportation (Non-Medical): Patient unable to answer  Physical Activity: Not on file  Stress: Not on file  Social Connections: Patient Unable To Answer (12/26/2023)   Social Connection and Isolation Panel [NHANES]    Frequency of Communication with Friends and Family: Patient unable to answer    Frequency of Social Gatherings with Friends and Family: Patient unable to answer    Attends Religious Services: Patient unable to answer    Active Member of Clubs or Organizations: Patient unable to answer    Attends Banker Meetings: Patient unable to answer    Marital Status: Patient unable to answer  Intimate Partner Violence: Not At Risk (12/26/2023)   Humiliation, Afraid, Rape, and Kick questionnaire    Fear of Current or Ex-Partner: No    Emotionally Abused: No    Physically Abused: No    Sexually Abused: No     History reviewed. No pertinent family history.  ROS: Otherwise negative unless mentioned in HPI  Physical  Examination  Vitals:   12/31/23 1500 12/31/23 1530  BP: 132/62 120/63  Pulse: 71 78  Resp: 18 15  Temp:    SpO2: 100% 100%   Body mass index is 24.9 kg/m.  General:  WDWN in NAD Gait: Not observed HENT: WNL, normocephalic Pulmonary: normal non-labored breathing, without Rales, rhonchi,  wheezing Cardiac: regular Abdomen:  soft, NT/ND, no masses Skin: without rashes Vascular Exam/Pulses: palpable radial pulses; palpable DP pulses Extremities: without ischemic changes, without Gangrene , without cellulitis; without open wounds;  Musculoskeletal: no muscle wasting or atrophy  Neurologic: A&O X 3;  No focal  weakness or paresthesias are detected; speech is fluent/normal Psychiatric:  The pt has Normal affect. Lymph:  Unremarkable  CBC    Component Value Date/Time   WBC 14.9 (H) 12/31/2023 1110   RBC 3.22 (L) 12/31/2023 1110   HGB 9.9 (L) 12/31/2023 1110   HCT 29.7 (L) 12/31/2023 1110   PLT 199 12/31/2023 1110   MCV 92.2 12/31/2023 1110   MCH 30.7 12/31/2023 1110   MCHC 33.3 12/31/2023 1110   RDW 11.9 12/31/2023 1110   LYMPHSABS 1.5 10/10/2023 0414   MONOABS 1.3 (H) 10/10/2023 0414   EOSABS 0.2 10/10/2023 0414   BASOSABS 0.0 10/10/2023 0414    BMET    Component Value Date/Time   NA 131 (L) 12/31/2023 1110   K 4.0 12/31/2023 1110   CL 99 12/31/2023 1110   CO2 24 12/31/2023 1110   GLUCOSE 111 (H) 12/31/2023 1110   BUN 50 (H) 12/31/2023 1110   CREATININE 2.81 (H) 12/31/2023 1110   CALCIUM 9.0 12/31/2023 1110   GFRNONAA 23 (L) 12/31/2023 1110   GFRAA 44 (L) 01/14/2018 1112    COAGS: Lab Results  Component Value Date   INR 1.2 10/05/2023       ASSESSMENT/PLAN: This is a 73 y.o. male with end-stage renal disease on hemodialysis  Mr. Philip Benjamin is a 73 year old male being seen in consultation for evaluation for dialysis access.  He was initiated on hemodialysis in October of last year and has been dialyzing via right IJ TDC in [place].  He was scheduled to  be seen in our office for dialysis access however suffered a stroke.  He is currently in inpatient rehabilitation due to right leg deficit from CVA.  He is right arm dominant and would prefer access placement in the left arm.  He does not take any blood thinners and does not have a pacemaker.  We can complete vein mapping while he is inpatient however would allow him to recover from recent stroke prior to proceeding with elective access placement.  On-call vascular surgeon Dr. Myra Gianotti will evaluate the patient later today and provide further treatment plans.   Emilie Rutter PA-C [facility] [phone]   I agree with the above.  I have seen and evaluated the patient.  We have been asked to evaluate him for dialysis access.  He started dialysis in October.  He uses a right sided catheter.  He would prefer access in the left arm he has a palpable left radial pulse.  Vein mapping is pending.  I discussed placing a fistula versus graft in the left arm on Friday.  He will need to be n.p.o. after midnight Thursday night.  [name]

## 2023-12-31 NOTE — Procedures (Signed)
 HD Note:  Some information was entered later than the data was gathered due to patient care needs. The stated time with the data is accurate.  Received patient in bed to unit.   Alert and oriented.   Informed consent signed and in chart.   Access used: Right upper chest HD catheter Access issues: None  Patient tolerated treatment well.   TX duration: 3.5 hours  Alert, without acute distress.  Total UF removed: 1500  Hand-off given to patient's nurse.   Transported back to the room   Gabbriella Presswood L. Dareen Piano, RN [facility].

## 2023-12-31 NOTE — Progress Notes (Signed)
 Physical Therapy Weekly Progress Note  Patient Details  Name: Philip Benjamin MRN: [id number] Date of Birth: 1950/12/27  Beginning of progress report period: December 24, 2023 End of progress report period: December 31, 2023  Patient has met 0 of 4 short term goals. Pt is making slower than anticipated progress towards functional LTG of CGA/minA. Currently he requires minA for bed mobility with hospital bed features, minA for sit<>stand transfers using the RW, and minA for stand pivot transfers using the RW from bed<>chair. He has been using the LiteGait for BWS overground ambulating > 140ft. He continues to require skilled heavy minA for ambulation with a RW for short distances ~10-65ft. He has been using a R AFO and R shoe cover to assist with swing phase of gait. Pt had near fall with PT on 3/12 while ambulating need +2 maxA for balance recovery.   Patient continues to demonstrate the following deficits muscle weakness and muscle joint tightness, decreased cardiorespiratoy endurance, decreased visual perceptual skills, and decreased standing balance, hemiplegia, and decreased balance strategies and therefore will continue to benefit from skilled PT intervention to increase functional independence with mobility.  Patient progressing toward long term goals..  Continue plan of care. Monitoring need to downgrade goals based on next weeks progress.   PT Short Term Goals Week 1:  PT Short Term Goal 1 (Week 1): Pt will complete bed mobility with minA without hospial bed features PT Short Term Goal 1 - Progress (Week 1): Not met PT Short Term Goal 2 (Week 1): Pt will complete bed<>chair transfers with CGA and LRAD PT Short Term Goal 2 - Progress (Week 1): Not met PT Short Term Goal 3 (Week 1): Pt will ambulate 39ft with minA and LRAD PT Short Term Goal 3 - Progress (Week 1): Not met PT Short Term Goal 4 (Week 1): Pt will initiate stair training PT Short Term Goal 4 - Progress (Week 1): Not met PT Short  Term Goal 5 (Week 1): Pt will improve BERG balance score by at least 8 points Week 2:  PT Short Term Goal 1 (Week 2): STG = LTG due to ELOS    Philip Benjamin P Ajai Harville PT 12/31/2023, 9:30 AM

## 2023-12-31 NOTE — Progress Notes (Signed)
 [facility] Progress Note   Subjective: Patient feels tired today.  No diarrhea today.  No other complaints  Objective Vitals:   12/30/23 0919 12/30/23 1443 12/30/23 1950 12/31/23 0508  BP: 130/72 105/63 128/71 122/69  Pulse:  74 72 70  Resp:  17 16 16   Temp:  98 F (36.7 C) 98.1 F (36.7 C) 97.8 F (36.6 C)  TempSrc:  Oral Oral Oral  SpO2:  100% 100% 100%  Weight:      Height:       Physical Exam General: Elderly man, NAD. Room air Heart: normal rate Lungs: Bilateral chest rise with no increased work of breathing Extremities: Trace edema, warm and well-perfused Dialysis Access: [facility]  Additional Objective Labs: Basic Metabolic Panel: Recent Labs  Lab 12/26/23 0633 12/29/23 1430  NA 133* 127*  K 4.1 4.3  CL 99 95*  CO2 26 21*  GLUCOSE 165* 230*  BUN 48* 78*  CREATININE 2.61* 3.25*  CALCIUM 9.0 8.5*  PHOS 3.9 3.9   Liver Function Tests: Recent Labs  Lab 12/26/23 0633 12/29/23 1430  ALBUMIN 3.1* 2.8*   CBC: Recent Labs  Lab 12/26/23 0633 12/29/23 1430  WBC 9.7 9.1  HGB 10.4* 9.1*  HCT 31.5* 27.5*  MCV 91.6 92.6  PLT 162 177   Blood Culture    Component Value Date/Time   SDES  10/06/2023 1532    BILE Performed at [facility], [address], [place], [place] [address]    [facility]  10/06/2023 1532    Normal Performed at [facility], [address], [place], [place] [address]    CULT  10/06/2023 1532    No growth aerobically or anaerobically. Performed at [facility], [address], [place], [place] [address]    REPTSTATUS 10/11/2023 FINAL 10/06/2023 1532   Medications:  anticoagulant sodium citrate      amLODipine  5 mg Oral Daily   aspirin  81 mg Oral Daily   atorvastatin  40 mg Oral Daily   Chlorhexidine Gluconate Cloth  6 each Topical Q12H   cholecalciferol  1,000 Units Oral Daily   cycloSPORINE  1 drop Both Eyes BID   dorzolamide  1 drop Both Eyes BID   And   timolol  1 drop Both Eyes BID   FLUoxetine   20 mg Oral Daily   heparin  5,000 Units Subcutaneous Q8H   heparin sodium (porcine)  2,000 Units Intravenous Once   insulin glargine  34 Units Subcutaneous Daily   latanoprost  1 drop Left Eye QHS   multivitamin  1 tablet Oral QHS   senna  1 tablet Oral QODAY   tamsulosin  0.4 mg Oral Q supper   tiZANidine  2 mg Oral BID    Dialysis Orders MWF - [facility] 4hr, 350/500, EDW 86.5kg, 2K/2.5Ca, TDC, heparin 1000U/hr - Venofer 50mg  IV q Monday - Mircera IV q 4 weeks (last 2/17)   Assessment/Plan: Hx acute L lentiform nucleus CVA: Now in CIR getting PT/OT.  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 Hx cholecystitis 09/2023: S/p IR chole tube, now removed. ESRD: Continue HD on MWF schedule  HTN/volume: BP stable. UF as tolerated. 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  Will start ESA Secondary HPTH: Ca/Phos ok - not on binders, on Vit D3. Nutrition: Alb low, continue protein supplements. Diarrhea: per primary, seems improved T2DM Dispo: Pending d/c date 3/20.   12/31/2023, 11:01 AM  [facility]

## 2023-12-31 NOTE — Progress Notes (Signed)
 Physical Therapy Session Note  Patient Details  Name: Philip Benjamin MRN: [id number] Date of Birth: 07-19-1951  Today's Date: 12/31/2023 PT Individual Time: 3086-5784 PT Individual Time Calculation (min): 71 min   Short Term Goals: Week 1:  PT Short Term Goal 1 (Week 1): Pt will complete bed mobility with minA without hospial bed features PT Short Term Goal 2 (Week 1): Pt will complete bed<>chair transfers with CGA and LRAD PT Short Term Goal 3 (Week 1): Pt will ambulate 75ft with minA and LRAD PT Short Term Goal 4 (Week 1): Pt will initiate stair training PT Short Term Goal 5 (Week 1): Pt will improve BERG balance score by at least 8 points  Skilled Therapeutic Interventions/Progress Updates:      Pt seated in WC upon arrival. Pt agreeable to therapy. Pt denies any pain. Pt reports feeling weaker today overall and frustrated regarding his weakness and CLOF. Therapist provided encouragement.  Pt reports need to use the bathroom. Pt performed stand pivot transfer WC to [facility] with use of grab bar and min A for R LE buckling, doffed/donned pants and performed pericare with total A. Pt required increased time toileting. Pt continent of bowel, see flowsheets.   Pt performed sit to stand 2x5 with RW and min A progressing to CGA, verbal cues provided for anterior weight shift, UE posiitoning, and controlled descent. Pt demos improved carry over throughout session.   Pt performed 2x10 marching with RW and CGA/min A for R posterolateral trunk lean, verbal cues provided for correction.   Pt ambulated 1x40, 1x30, 1x13 feet with RW and +2 for WC follow. Pt demos narrow BOS, with step to step length, with verbal and tactile cues provided for correction...pt demos improved reciprocal gait and neutral BOS with therapist providing A to maintain continuous movement of RW, and verbal cues for R step, L step as pt demos decreased hesitancy.   Pt seated in [facility] with all needs within reach, chair alarm on, and  tech/family in room at end of session.    Therapy Documentation Precautions:  Precautions Precautions: Fall Precaution/Restrictions Comments: R hemi Restrictions Weight Bearing Restrictions Per Provider Order: No Other Position/Activity Restrictions: r hemiparesis   Therapy/Group: Individual Therapy  [place] Ambrose Finland, [place], DPT  12/31/2023, 7:52 AM

## 2023-12-31 NOTE — Progress Notes (Signed)
 Hypoglycemic Event  CBG: 48  Treatment: 8 oz juice/soda  Symptoms: None  Follow-up CBG: Time:2206 CBG Result:113  Possible Reasons for Event: Unknown  Comments/MD notified: Yes    Marny Lowenstein

## 2023-12-31 NOTE — Progress Notes (Addendum)
 Physical Therapy Session Note  Patient Details  Name: Philip Benjamin MRN: [id number] Date of Birth: Jun 08, 1951  Today's Date: 12/31/2023 PT Individual Time: 4098-1191 PT Individual Time Calculation (min): 54 min   Short Term Goals: Week 1:  PT Short Term Goal 1 (Week 1): Pt will complete bed mobility with minA without hospial bed features PT Short Term Goal 2 (Week 1): Pt will complete bed<>chair transfers with CGA and LRAD PT Short Term Goal 3 (Week 1): Pt will ambulate 59ft with minA and LRAD PT Short Term Goal 4 (Week 1): Pt will initiate stair training PT Short Term Goal 5 (Week 1): Pt will improve BERG balance score by at least 8 points  Skilled Therapeutic Interventions/Progress Updates:      Direct handoff of care from OT with patient sitting sinkside finishing up oral care. Pt agreeable to therapy and has no c/o pain. Reports improvement in loose stools.   Donned slip on shoes, R AFO with totalA. Transported at wheelchair level to day room gym. MinA for stand pivot transfer from w/c to Nustep. Nustep used for neural priming to prepare for gait training, improving activity tolerance, and general strengthening. SetupA for BLE. Completed at L7 resistance using BUE/BLE x12 minutes with cues for full AROM bilaterally. Achieved 385 steps in total.   Short distance gait training using RW. Pt ambulated ~73ft + 40ft + 10ft+ 46ft ft with minA - assist for RW management, facilitating lateral weight shifting, and providing cues for increasing step length/height on his RLE. Pt had LARGE LOB with ?R knee buckling during gait needing immediate + 2 assist for balance recovery and to prevent fall to the R. Pt lacking balance recovery strategies and needed MAX A for recovery to prevent fall.   Returned to his room and patient left sitting upright in wheelchair. MD present to discuss progress and pt reports frustration re: slower than anticipated progress. Therapeutic listening and encouragement  provided to help boost mood.   Therapy Documentation Precautions:  Precautions Precautions: Fall Precaution/Restrictions Comments: R hemi Restrictions Weight Bearing Restrictions Per Provider Order: No Other Position/Activity Restrictions: r hemiparesis General:    Therapy/Group: Individual Therapy  [name] 12/31/2023, 7:36 AM

## 2023-12-31 NOTE — Progress Notes (Signed)
 Contacted [facility] to advise clinic of pt's change in d/c date. Pt is set to d/c on Thursday, March 27. Clinic advised pt should resume on March 28. Will assist as needed.   Olivia Canter Renal Navigator [phone]

## 2023-12-31 NOTE — Progress Notes (Addendum)
 Patient ID: Philip Benjamin, male   DOB: 08-Nov-1950, 73 y.o.   MRN: [id number]  Met with pt, wife and daughter were present in his room to give team conference update regarding goals of CGA-min assist level and target discharge date of 3/27, have extended him to work on his strengthening and reaching his goals. Will let renal navigator know of change in date. Pt has high expectations of himself and feels he should be doing better. He is making progress and doing his best and he is thankful team feels he is doing well. Daughter to work on ramp for the steps at home and will come in closer to discharge to see in therapies. Continue to work on discharge needs. Daughter asked if fistula could be placed here since was suppose to be next week. Have messaged MD regarding this

## 2023-12-31 NOTE — H&P (View-Only) (Signed)
 Hospital Consult    Reason for Consult:  dialysis access Requesting Physician:  Dr. Dalene Carrow MRN #:  [id number]  History of Present Illness: This is a 73 y.o. male being seen in consultation for evaluation for dialysis access.  He was initiated on HD in October 2024.  He has been dialyzing via right IJ TDC at [facility] in [place].  He is right arm dominant and would prefer access placement in the left arm.  He has not had any prior access surgery.  He is currently in inpatient rehabilitation for recent stroke leaving him with a right leg deficit.  He has not had vein mapping.  He denies tobacco use.  He does not take any blood thinners and does not have a pacemaker.  He was scheduled to be seen in the office on 01/06/24 however will be in rehab until 01/08/2024.  Past Medical History:  Diagnosis Date   BPH (benign prostatic hyperplasia)    Chronic kidney disease    ckd stage 3   Diabetes mellitus without complication (HCC)    Hypertension     Past Surgical History:  Procedure Laterality Date   EYE SURGERY     INGUINAL HERNIA REPAIR Bilateral 01/23/2018   Procedure: BILATERAL OPEN INGUINAL HERNIA REPAIR WITH MESH;  Surgeon: Jimmye Norman, MD;  Location: [facility] OR;  Service: General;  Laterality: Bilateral;   INSERTION OF MESH Bilateral 01/23/2018   Procedure: INSERTION OF MESH;  Surgeon: Jimmye Norman, MD;  Location: [facility] OR;  Service: General;  Laterality: Bilateral;   IR FLUORO GUIDE CV LINE RIGHT  08/15/2023   IR PATIENT EVAL TECH 0-60 MINS  11/18/2023   IR PERC CHOLECYSTOSTOMY  10/06/2023   IR US GUIDE BX ASP/DRAIN  10/06/2023   IR US GUIDE VASC ACCESS RIGHT  08/15/2023   VASECTOMY     ~1985    No Known Allergies  Prior to Admission medications   Medication Sig Start Date End Date Taking? Authorizing Provider  acetaminophen (TYLENOL) 650 MG CR tablet Take 650 mg by mouth every 6 (six) hours as needed for pain.    [provider]  ALPRAZolam Prudy Feeler) 0.25 MG tablet Take 1 tablet (0.25 mg  total) by mouth 3 (three) times daily as needed for anxiety. 12/23/23   Sherryll Burger, Pratik D, DO  atorvastatin (LIPITOR) 20 MG tablet Take 1 tablet by mouth daily. 12/06/23 01/05/24  [provider]  Cholecalciferol 125 MCG (5000 UT) capsule Take by mouth.    [provider]  cycloSPORINE (RESTASIS) 0.05 % ophthalmic emulsion SMARTSIG:In Eye(s) 08/13/23   [provider]  dexamethasone (DECADRON) 4 MG tablet Take 1 tablet (4 mg total) by mouth every 12 (twelve) hours for 5 days, THEN 1 tablet (4 mg total) daily for 5 days. 12/23/23 01/02/24  Sherryll Burger, Pratik D, DO  Dorzolamide HCl-Timolol Mal PF 2-0.5 % SOLN Apply 1 drop to eye 2 (two) times daily. 08/18/23   [provider]  Fluoxetine HCl, PMDD, 20 MG TABS Take 1 tablet by mouth daily. 12/07/23 01/06/24  [provider]  folic acid (FOLVITE) 800 MCG tablet Take 800 mcg by mouth daily.    [provider]  LANTUS 100 UNIT/ML injection Inject 26 Units into the skin daily.    [provider]  latanoprost (XALATAN) 0.005 % ophthalmic solution Place 1 drop into both eyes at bedtime. Patient taking differently: Place 1 drop into the left eye at bedtime. 08/25/23   Catarina Hartshorn, MD  loperamide (IMODIUM) 2 MG capsule Take 1  capsule (2 mg total) by mouth as needed for diarrhea or loose stools. 12/23/23   Sherryll Burger, Pratik D, DO  multivitamin (RENA-VIT) TABS tablet Take 1 tablet by mouth at bedtime. 08/20/23   Catarina Hartshorn, MD  Nutritional Supplements (FEEDING SUPPLEMENT, NEPRO CARB STEADY,) LIQD Take 237 mLs by mouth 2 (two) times daily between meals. 12/23/23   Sherryll Burger, Pratik D, DO  nystatin cream (MYCOSTATIN) Apply 1 application topically 2 (two) times daily as needed. FOR YEAST OR SKIN RASHES. 12/16/17   [provider]  ondansetron (ZOFRAN) 4 MG tablet Take by mouth 2 (two) times daily. 12/06/23   [provider]  tamsulosin (FLOMAX) 0.4 MG CAPS capsule Take 0.4 mg by mouth daily with supper. 12/16/17    [provider]    Social History   Socioeconomic History   Marital status: Married    Spouse name: Not on file   Number of children: Not on file   Years of education: Not on file   Highest education level: Not on file  Occupational History   Not on file  Tobacco Use   Smoking status: Never   Smokeless tobacco: Never  Substance and Sexual Activity   Alcohol use: Never   Drug use: Never   Sexual activity: Not on file  Other Topics Concern   Not on file  Social History Narrative   Not on file   Social Drivers of Health   Financial Resource Strain: Not on file  Food Insecurity: Patient Unable To Answer (12/26/2023)   Hunger Vital Sign    Worried About Running Out of Food in the Last Year: Patient unable to answer    Ran Out of Food in the Last Year: Patient unable to answer  Transportation Needs: Patient Unable To Answer (12/26/2023)   PRAPARE - Transportation    Lack of Transportation (Medical): Patient unable to answer    Lack of Transportation (Non-Medical): Patient unable to answer  Physical Activity: Not on file  Stress: Not on file  Social Connections: Patient Unable To Answer (12/26/2023)   Social Connection and Isolation Panel [NHANES]    Frequency of Communication with Friends and Family: Patient unable to answer    Frequency of Social Gatherings with Friends and Family: Patient unable to answer    Attends Religious Services: Patient unable to answer    Active Member of Clubs or Organizations: Patient unable to answer    Attends Banker Meetings: Patient unable to answer    Marital Status: Patient unable to answer  Intimate Partner Violence: Not At Risk (12/26/2023)   Humiliation, Afraid, Rape, and Kick questionnaire    Fear of Current or Ex-Partner: No    Emotionally Abused: No    Physically Abused: No    Sexually Abused: No     History reviewed. No pertinent family history.  ROS: Otherwise negative unless mentioned in HPI  Physical  Examination  Vitals:   12/31/23 1500 12/31/23 1530  BP: 132/62 120/63  Pulse: 71 78  Resp: 18 15  Temp:    SpO2: 100% 100%   Body mass index is 24.9 kg/m.  General:  WDWN in NAD Gait: Not observed HENT: WNL, normocephalic Pulmonary: normal non-labored breathing, without Rales, rhonchi,  wheezing Cardiac: regular Abdomen:  soft, NT/ND, no masses Skin: without rashes Vascular Exam/Pulses: palpable radial pulses; palpable DP pulses Extremities: without ischemic changes, without Gangrene , without cellulitis; without open wounds;  Musculoskeletal: no muscle wasting or atrophy  Neurologic: A&O X 3;  No focal  weakness or paresthesias are detected; speech is fluent/normal Psychiatric:  The pt has Normal affect. Lymph:  Unremarkable  CBC    Component Value Date/Time   WBC 14.9 (H) 12/31/2023 1110   RBC 3.22 (L) 12/31/2023 1110   HGB 9.9 (L) 12/31/2023 1110   HCT 29.7 (L) 12/31/2023 1110   PLT 199 12/31/2023 1110   MCV 92.2 12/31/2023 1110   MCH 30.7 12/31/2023 1110   MCHC 33.3 12/31/2023 1110   RDW 11.9 12/31/2023 1110   LYMPHSABS 1.5 10/10/2023 0414   MONOABS 1.3 (H) 10/10/2023 0414   EOSABS 0.2 10/10/2023 0414   BASOSABS 0.0 10/10/2023 0414    BMET    Component Value Date/Time   NA 131 (L) 12/31/2023 1110   K 4.0 12/31/2023 1110   CL 99 12/31/2023 1110   CO2 24 12/31/2023 1110   GLUCOSE 111 (H) 12/31/2023 1110   BUN 50 (H) 12/31/2023 1110   CREATININE 2.81 (H) 12/31/2023 1110   CALCIUM 9.0 12/31/2023 1110   GFRNONAA 23 (L) 12/31/2023 1110   GFRAA 44 (L) 01/14/2018 1112    COAGS: Lab Results  Component Value Date   INR 1.2 10/05/2023       ASSESSMENT/PLAN: This is a 73 y.o. male with end-stage renal disease on hemodialysis  Mr. Johnathyn Viscomi is a 73 year old male being seen in consultation for evaluation for dialysis access.  He was initiated on hemodialysis in October of last year and has been dialyzing via right IJ TDC in [place].  He was scheduled to  be seen in our office for dialysis access however suffered a stroke.  He is currently in inpatient rehabilitation due to right leg deficit from CVA.  He is right arm dominant and would prefer access placement in the left arm.  He does not take any blood thinners and does not have a pacemaker.  We can complete vein mapping while he is inpatient however would allow him to recover from recent stroke prior to proceeding with elective access placement.  On-call vascular surgeon Dr. Myra Gianotti will evaluate the patient later today and provide further treatment plans.   Emilie Rutter PA-C [facility] [phone]   I agree with the above.  I have seen and evaluated the patient.  We have been asked to evaluate him for dialysis access.  He started dialysis in October.  He uses a right sided catheter.  He would prefer access in the left arm he has a palpable left radial pulse.  Vein mapping is pending.  I discussed placing a fistula versus graft in the left arm on Friday.  He will need to be n.p.o. after midnight Thursday night.  [name]

## 2024-01-01 ENCOUNTER — Inpatient Hospital Stay (HOSPITAL_BASED_OUTPATIENT_CLINIC_OR_DEPARTMENT_OTHER)

## 2024-01-01 DIAGNOSIS — N186 End stage renal disease: Secondary | ICD-10-CM

## 2024-01-01 LAB — CBC WITH DIFFERENTIAL/PLATELET
Abs Immature Granulocytes: 0.1 10*3/uL — ABNORMAL HIGH (ref 0.00–0.07)
Basophils Absolute: 0 10*3/uL (ref 0.0–0.1)
Basophils Relative: 0 %
Eosinophils Absolute: 0.2 10*3/uL (ref 0.0–0.5)
Eosinophils Relative: 1 %
HCT: 30.4 % — ABNORMAL LOW (ref 39.0–52.0)
Hemoglobin: 10 g/dL — ABNORMAL LOW (ref 13.0–17.0)
Immature Granulocytes: 1 %
Lymphocytes Relative: 8 %
Lymphs Abs: 1.2 10*3/uL (ref 0.7–4.0)
MCH: 30.1 pg (ref 26.0–34.0)
MCHC: 32.9 g/dL (ref 30.0–36.0)
MCV: 91.6 fL (ref 80.0–100.0)
Monocytes Absolute: 1.8 10*3/uL — ABNORMAL HIGH (ref 0.1–1.0)
Monocytes Relative: 12 %
Neutro Abs: 12.3 10*3/uL — ABNORMAL HIGH (ref 1.7–7.7)
Neutrophils Relative %: 78 %
Platelets: 198 10*3/uL (ref 150–400)
RBC: 3.32 MIL/uL — ABNORMAL LOW (ref 4.22–5.81)
RDW: 11.9 % (ref 11.5–15.5)
WBC: 15.6 10*3/uL — ABNORMAL HIGH (ref 4.0–10.5)
nRBC: 0 % (ref 0.0–0.2)

## 2024-01-01 LAB — URINALYSIS, ROUTINE W REFLEX MICROSCOPIC
Bilirubin Urine: NEGATIVE
Glucose, UA: NEGATIVE mg/dL
Hgb urine dipstick: NEGATIVE
Ketones, ur: NEGATIVE mg/dL
Nitrite: NEGATIVE
Protein, ur: NEGATIVE mg/dL
Specific Gravity, Urine: 1.01 (ref 1.005–1.030)
WBC, UA: >50 WBC/hpf (ref 0–5)
pH: 7 (ref 5.0–8.0)

## 2024-01-01 LAB — GLUCOSE, CAPILLARY
Glucose-Capillary: 75 mg/dL (ref 70–99)
Glucose-Capillary: 56 mg/dL — ABNORMAL LOW (ref 70–99)
Glucose-Capillary: 72 mg/dL (ref 70–99)
Glucose-Capillary: 122 mg/dL — ABNORMAL HIGH (ref 70–99)
Glucose-Capillary: 162 mg/dL — ABNORMAL HIGH (ref 70–99)
Glucose-Capillary: 154 mg/dL — ABNORMAL HIGH (ref 70–99)

## 2024-01-01 LAB — PROCALCITONIN: Procalcitonin: 0.37 ng/mL

## 2024-01-01 MED ORDER — CEPHALEXIN 250 MG PO CAPS
500.0000 mg | ORAL_CAPSULE | Freq: Two times a day (BID) | ORAL | Status: AC
  Administered 2024-01-01 – 2024-01-07 (×12): 500 mg via ORAL
  Filled 2024-01-01 (×14): qty 2

## 2024-01-01 MED ORDER — DARBEPOETIN ALFA 60 MCG/0.3ML IJ SOSY
60.0000 ug | PREFILLED_SYRINGE | INTRAMUSCULAR | Status: DC
  Administered 2024-01-01 – 2024-01-08 (×2): 60 ug via SUBCUTANEOUS
  Filled 2024-01-01 (×3): qty 0.3

## 2024-01-01 MED ORDER — INSULIN GLARGINE 100 UNIT/ML ~~LOC~~ SOLN
32.0000 [IU] | Freq: Every day | SUBCUTANEOUS | Status: DC
  Administered 2024-01-01 – 2024-01-02 (×2): 32 [IU] via SUBCUTANEOUS
  Filled 2024-01-01 (×2): qty 0.32

## 2024-01-01 NOTE — Progress Notes (Addendum)
 [facility] Progress Note   Subjective: Patient continues to feel tired but in good spirits. No complaints.  Objective Vitals:   12/31/23 1835 12/31/23 1849 12/31/23 1959 01/01/24 0502  BP:  137/69 124/70 121/64  Pulse: 77 80 77 73  Resp: 13 15 18 18   Temp:  98.4 F (36.9 C) 97.8 F (36.6 C) 98.4 F (36.9 C)  TempSrc:      SpO2: 100% 100% 100% 100%  Weight:      Height:       Physical Exam General: Elderly man, NAD. Room air Heart: normal rate Lungs: Bilateral chest rise with no increased work of breathing Extremities: no edema, warm and well-perfused Dialysis Access: [facility]  Additional Objective Labs: Basic Metabolic Panel: Recent Labs  Lab 12/26/23 0633 12/29/23 1430 12/31/23 1110  NA 133* 127* 131*  K 4.1 4.3 4.0  CL 99 95* 99  CO2 26 21* 24  GLUCOSE 165* 230* 111*  BUN 48* 78* 50*  CREATININE 2.61* 3.25* 2.81*  CALCIUM 9.0 8.5* 9.0  PHOS 3.9 3.9 3.0   Liver Function Tests: Recent Labs  Lab 12/26/23 0633 12/29/23 1430 12/31/23 1110  ALBUMIN 3.1* 2.8* 3.0*   CBC: Recent Labs  Lab 12/26/23 0633 12/29/23 1430 12/31/23 1110 01/01/24 0628  WBC 9.7 9.1 14.9* 15.6*  NEUTROABS  --   --   --  12.3*  HGB 10.4* 9.1* 9.9* 10.0*  HCT 31.5* 27.5* 29.7* 30.4*  MCV 91.6 92.6 92.2 91.6  PLT 162 177 199 198   Blood Culture    Component Value Date/Time   SDES  10/06/2023 1532    BILE Performed at [facility], [address] [place], [place] [address]    [facility]  10/06/2023 1532    Normal Performed at [facility], [address] [place], [place] [address]    CULT  10/06/2023 1532    No growth aerobically or anaerobically. Performed at [facility], [address] [place], [place] [address]    REPTSTATUS 10/11/2023 FINAL 10/06/2023 1532   Medications:    amLODipine  5 mg Oral Daily   aspirin  81 mg Oral Daily   atorvastatin  40 mg Oral Daily   cephALEXin  500 mg Oral Q12H   Chlorhexidine Gluconate Cloth  6 each  Topical Q12H   cholecalciferol  1,000 Units Oral Daily   cycloSPORINE  1 drop Both Eyes BID   darbepoetin (ARANESP) injection - DIALYSIS  60 mcg Subcutaneous Q Thu-1800   dorzolamide  1 drop Both Eyes BID   And   timolol  1 drop Both Eyes BID   FLUoxetine  20 mg Oral Daily   heparin  5,000 Units Subcutaneous Q8H   heparin sodium (porcine)  3,800 Units Intracatheter Once   insulin glargine  32 Units Subcutaneous Daily   latanoprost  1 drop Left Eye QHS   multivitamin  1 tablet Oral QHS   tamsulosin  0.4 mg Oral Q supper   tiZANidine  2 mg Oral QHS    Dialysis Orders MWF - [facility] 4hr, 350/500, EDW 86.5kg, 2K/2.5Ca, TDC, heparin 1000U/hr - Venofer 50mg  IV q Monday - Mircera IV q 4 weeks (last 2/17)   Assessment/Plan: Hx acute L lentiform nucleus CVA: Now in CIR getting PT/OT.  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 Hx cholecystitis 09/2023: S/p IR chole tube, now removed. ESRD: Continue HD on MWF schedule  HTN/volume: BP stable. UF as tolerated. Anemia of  ESRD: Hgb 9.1 -iron replete.  Did not get esa yesterday, reordered for today Secondary HPTH: Ca/Phos ok - not on binders, on Vit D3. Nutrition: Alb low, continue protein supplements. Diarrhea: per primary, seems improved T2DM Dispo: Pending d/c date 3/20.   01/01/2024, 2:02 PM   [facility]

## 2024-01-01 NOTE — Patient Care Conference (Signed)
 Inpatient RehabilitationTeam Conference and Plan of Care Update Date: 12/31/2023   Time: 11:05 AM    Patient Name: Philip Benjamin      Medical Record Number: [id number]  Date of Birth: 02/19/1951 Sex: Male         Room/Bed: 4W02C/4W02C-01 Payor Info: Payor: MEDICARE / Plan: MEDICARE PART A AND B / Product Type: *No Product type* /    Admit Date/Time:  12/23/2023 11:57 AM  Primary Diagnosis:  Left middle cerebral artery stroke [facility])  Hospital Problems: Principal Problem:   Left middle cerebral artery stroke [facility])    Expected Discharge Date: Expected Discharge Date: 01/15/24 (d/c extended)  Team Members Present: Physician leading conference: Dr. Sula Soda Social Worker Present: Dossie Der, LCSW Nurse Present: Chana Bode, RN PT Present: Wynelle Link, PT OT Present: Candee Furbish, OT SLP Present: Jeannie Done, SLP PPS Coordinator present : Fae Pippin, SLP     Current Status/Progress Goal Weekly Team Focus  Bowel/Bladder   Currently continent B/B  LBM 12/30/23   Will remain continent B/B with normal pattern   Assist with toilet needs qsift/prn    Swallow/Nutrition/ Hydration               ADL's   mod A BADLS- limited by decreased vision, R weakensss, limited functional use of B UE   Min assist   improve functional use of R UE, improve standing balance/tolerance    Mobility   minA sit<>stand, minA stand pivot with RW, min/modA gait short distances wtih RW. Have been using LiteGait overground for BWS. Used AFO on R foot   CGA/supervision - monitoring need to downgrade to minA  functional transfers, standing balance, gait training, endurance training, initiating stair training    Communication                Safety/Cognition/ Behavioral Observations               Pain   Denies pain on this shift   Will be free from pain   Assess pt for pain qshift/prn    Skin   Skin is intact   Will maintain skin intergrity with no breakdown  Assess  skin qshift/prn and provide education to prevent breakdown      Discharge Planning:  HOme with wife who has dementia and can not assist and daughter works three nights per week and son works days. Daughter was helping him dress prior to admission due to UE's function. Has used SNF days   Team Discussion: Patient post left MCA CVA with right side weakness and decreased vision with premorbid bilateral UE limitations. Progress limited by diarrhea and hypoglycemia; MD adjusting medications with situational depression and learned dependence and tone.   Patient on target to meet rehab goals: yes, currently needs min assist for sit - stand and stand pivot transfers due to right foot drag.  Needs mod assist for ADLs. Goals for discharge set for CGA - min assist overall.  *See Care Plan and progress notes for long and short-term goals.   Revisions to Treatment Plan:  AFO consult  Neuro psych consult  Teaching Needs: Safety, medications, transfers, toileting, etc.   Current Barriers to Discharge: Decreased caregiver support, Home enviroment access/layout, and Behavior, Hemodialysis and lack of insurance for SNF placement; has used all of his SNF days  Possible Resolutions to Barriers: Family education DME: hospital bed     Medical Summary Current Status: diarrhea, hypertension, depression, uncontrolled type 2 diabetes, spasticity  Barriers to Discharge:  Medical stability  Barriers to Discharge Comments: diarrhea, hypertension, depression, uncontrolled type 2 diabetes, spasticity Possible Resolutions to [employer] Focus: d/c prosource, continue imodium/fruit, amlodipine added, neuropsych consulted, continue tizanidine, d/c ISS, increase glargine   Continued Need for Acute Rehabilitation Level of Care: The patient requires daily medical management by a physician with specialized training in physical medicine and rehabilitation for the following reasons: Direction of a multidisciplinary  physical rehabilitation program to maximize functional independence : Yes Medical management of patient stability for increased activity during participation in an intensive rehabilitation regime.: Yes Analysis of laboratory values and/or radiology reports with any subsequent need for medication adjustment and/or medical intervention. : Yes   I attest that I was present, lead the team conference, and concur with the assessment and plan of the team.   Chana Bode B 01/01/2024, 10:33 AM

## 2024-01-01 NOTE — Progress Notes (Signed)
 Occupational Therapy Session Note  Patient Details  Name: Philip Benjamin MRN: [id number] Date of Birth: 06-Apr-1951  Today's Date: 01/01/2024 OT Individual Time: 4098-1191 OT Individual Time Calculation (min): 45 min    Short Term Goals: Week 2:  OT Short Term Goal 1 (Week 2): Patient will complete stand step toilet transfer with close supervision OT Short Term Goal 2 (Week 2): Pt will pull pants up/down over hips with CGA usign LRAD OT Short Term Goal 3 (Week 2): Pt will donn pull over head shirt with supervision consistently OT Short Term Goal 4 (Week 2): Pt tolerate standing >2 minutes during BADLs or functional activity using LRAD CGA  Skilled Therapeutic Interventions/Progress Updates: Patient received sitting up in bed.  Reports having a poor nights sleep but is motivated to participate with OT to complete ADL training. Patient transitioned from supine to sitting EOB with Mod assist. Transfer from EOB to w/c then w/c to toilet Mod/Min assist. Improved standing balance with use of grab bars at the toilet. Continued treatment with grooming and bathing seated at the sink. See below for ADL functional levels. Patient left sitting up in w/c call bell in reach. Continue with skilled OT treatment to improve independence with self care and transfers in preparation for patient returning home with family assist.     Therapy Documentation Precautions:  Precautions Precautions: Fall Precaution/Restrictions Comments: R hemi Restrictions Weight Bearing Restrictions Per Provider Order: No Other Position/Activity Restrictions: r hemiparesis  Pain:No c/o   ADL: ADL  Grooming: Minimal assistance Where Assessed-Grooming: Sitting at sink Upper Body Bathing: Minimal assistance Where Assessed-Upper Body Bathing: Sitting at sink Lower Body Bathing: Maximal assistance Where Assessed-Lower Body Bathing: Sitting at sink, Standing at sink Upper Body Dressing: Min assistance Where Assessed-Upper  Body Dressing: Sitting at sink Lower Body Dressing: Maximal assistance Where Assessed-Lower Body Dressing: Sitting at sink, Standing at sink Toileting: Maximal assistance Where Assessed-Toileting: Teacher, adult education: Curator Method: Stand Wellsite geologist: Bedside commode, Grab bars        Therapy/Group: Individual Therapy  Warnell Forester 01/01/2024, 8:45 AM

## 2024-01-01 NOTE — Progress Notes (Signed)
 Upper extremity vein mapping complete. Preliminary report can be found under CV tab.  Dondra Prader RVT RCS

## 2024-01-01 NOTE — Plan of Care (Signed)
  Problem: RH BOWEL ELIMINATION Goal: RH STG MANAGE BOWEL WITH ASSISTANCE Description: STG Manage Bowel with mod I Assistance. Outcome: Progressing   Problem: RH BLADDER ELIMINATION Goal: RH STG MANAGE BLADDER WITH ASSISTANCE Description: STG Manage Bladder With min Assistance Outcome: Progressing   Problem: RH SAFETY Goal: RH STG ADHERE TO SAFETY PRECAUTIONS W/ASSISTANCE/DEVICE Description: STG Adhere to Safety Precautions With cues  Assistance/Device. Outcome: Progressing

## 2024-01-01 NOTE — Inpatient Diabetes Management (Signed)
 Inpatient Diabetes Program Recommendations  AACE/ADA: New Consensus Statement on Inpatient Glycemic Control   Target Ranges:  Prepandial:   less than 140 mg/dL      Peak postprandial:   less than 180 mg/dL (1-2 hours)      Critically ill patients:  140 - 180 mg/dL    Latest Reference Range & Units 12/31/23 06:07 12/31/23 11:56 12/31/23 21:25 12/31/23 22:06 01/01/24 06:25  Glucose-Capillary 70 - 99 mg/dL 604 (H) 68 (L) 48 (L) 540 (H) 75   Review of Glycemic Control  Diabetes history: DM2 Outpatient Diabetes medications: Lantus 26 units daily Current orders for Inpatient glycemic control: Lantus 34 units daily   Inpatient Diabetes Program Recommendations:     Insulin: Please consider decreasing Lantus to 26 units daily and ordering CBGs AC&HS with Novolog 0-9 units TID with meals and Novolog 0-5 units at bedtime.   NOTE: Last Decadron given on 12/29/23 at 7:50 am. CBG down to 48 mg/dl at 98:11 and 75 mg/dl today.   Thanks, Orlando Penner, RN, MSN, CDCES Diabetes Coordinator Inpatient Diabetes Program [phone] (Team Pager from 8am to 5pm)

## 2024-01-01 NOTE — Progress Notes (Addendum)
 Physical Therapy Session Note  Patient Details  Name: Philip Benjamin MRN: [id number] Date of Birth: September 06, 1951  Today's Date: 01/01/2024 PT Individual Time: 4098-1191 PT Individual Time Calculation (min): 55 min  PT missed time: 20 min (fatigue)   Short Term Goals: Week 2:  PT Short Term Goal 1 (Week 2): STG = LTG due to ELOS  Skilled Therapeutic Interventions/Progress Updates:      Pt seated in WC upon arrival. Pt agreeable to therapy. Pt denies any pain. Pt reports he did not get much sleep last night 2/2 difficulty urinating. Notified nursing.  Pt requesting to stay in room and do chair level therex 2/2 urinary discomfort.   Began seated therex, and pt reporting urgent need to pee. Placed urine hat on BSC. Pt performed stand pivot transfer WC to [facility] with use of grab bars and CGA/light min A.   Pt continent of urine in hat, notified nurse.   Pt performed the following therex for B LE strengthening:   1x10 LAQ B   1x10 seated marching B  1x10 seated hip abduction B  1x10 seeated heel/toe raises B   Pt performed sit to stand x10 with RW and CGA/light min A for correction of R posterolateral lean, verbal cues provided for anterior weight shift and UE positioning.   Pt performed standing marching x10 with B LE, verbal cues provided for increased hip/knee flexion R LE.   Pt stood and performed glute sets x5 with use of RW, required seated rest break 2/2 LBP.   Therapist encouraged pt to walk during session post urination, as pt appears to feel better however pt reports "not today, maybe tomorrow."   Pt seated in WC at end of session with all needs within reach and chair alarm on.   Pt missed 20 minutes of therapy 2/2 fatigue. Will attempt to make up missed minutes as able.        Therapy Documentation Precautions:  Precautions Precautions: Fall Precaution/Restrictions Comments: R hemi Restrictions Weight Bearing Restrictions Per Provider Order: No Other  Position/Activity Restrictions: r hemiparesis  Therapy/Group: Individual Therapy  [facility] Ambrose Finland, [place], DPT  01/01/2024, 7:45 AM

## 2024-01-01 NOTE — Progress Notes (Addendum)
 Physical Therapy Session Note  Patient Details  Name: Philip Benjamin MRN: [id number] Date of Birth: 14-Sep-1951  Today's Date: 01/01/2024 PT Individual Time: 8657-8469 PT Individual Time Calculation (min): 70 min   Short Term Goals: Week 2:  PT Short Term Goal 1 (Week 2): STG = LTG due to ELOS  Skilled Therapeutic Interventions/Progress Updates:      Pt sitting in wheelchair finishing up the last few bites of lunch. Pt reports no pain - reports he has a UTI and is aware he is on anti-biotics. Pt updated on DC plan - extended x1 week to allow further progress and improve functional independence. Pt reports being excited and happy about the opportunity to continue his rehab.   Pt transported at wheelchair level to main rehab gym. Completed minA squat pivot transfer to mat table. Assisted to transition to long sitting on mat table with minA.   Worked on strengthening at [employer] table: -2x10 long sitting russian trunk twists with 4# dumbbell -2x10 long sitting overhead press with 4# dumbell -2x10 long sitting sit-ups (modified) -active hamstring stretches in long sitting 5x30 seconds -2x10 hooklying reverse crunches with single leg taps -2x10 windshield wipers.knee fall outs -1x8 R sidelying clamshells -1x8 L sidelying clamshells -1x8 R sidelying straight leg hip abd -1x8 L sidelying straight leg hip abd *rest breaks as needed b/w sets. PT providing assistance as needed for setup. Demonstration provided for all exercises to improve understanding and technique.   Pt needing minA for returning from supine to short sitting EOM. Squat pivot transfer completed with minA back to wheelchair. Returned to his room and assisted back to bed in similar manner. Able to transition back to bed with ++ effort and time. Pt quick to fall asleep once back to bed.     Therapy Documentation Precautions:  Precautions Precautions: Fall Precaution/Restrictions Comments: R hemi Restrictions Weight Bearing  Restrictions Per Provider Order: No Other Position/Activity Restrictions: r hemiparesis General:    Therapy/Group: Individual Therapy  [name] 01/01/2024, 7:53 AM

## 2024-01-01 NOTE — Progress Notes (Signed)
 PROGRESS NOTE   Subjective/Complaints: Decreased ability to void this morning but was able to provide a urine sample on his own, will check for UTI given leukocytosis and fatigue   ROS:  Pt denies SOB, abd pain, CP, and vision changes, acute pain  +malaise, +depression- situational, +diarrhea, +fatigue   Objective:   No results found. Recent Labs    12/31/23 1110 01/01/24 0628  WBC 14.9* 15.6*  HGB 9.9* 10.0*  HCT 29.7* 30.4*  PLT 199 198    Recent Labs    12/29/23 1430 12/31/23 1110  NA 127* 131*  K 4.3 4.0  CL 95* 99  CO2 21* 24  GLUCOSE 230* 111*  BUN 78* 50*  CREATININE 3.25* 2.81*  CALCIUM 8.5* 9.0     Intake/Output Summary (Last 24 hours) at 01/01/2024 1152 Last data filed at 01/01/2024 1000 Gross per 24 hour  Intake 240 ml  Output 1600 ml  Net -1360 ml         Physical Exam: Vital Signs Blood pressure 121/64, pulse 73, temperature 98.4 F (36.9 C), resp. rate 18, height 6\' 1"  (1.854 m), weight 85.6 kg, SpO2 100%.     General: awake, alert, appropriate, sitting up in w/c; NAD HENT: conjugate gaze; oropharynx moist CV: regular rate and rhythm; no JVD Pulmonary: CTA B/L; no W/R/R- good air movement GI: soft, NT, ND, (+)BS- more normoactive Psychiatric: appropriate- interactive Neurological: Ox3 MAS of 1+ to 2 in RLE and 1+ in LLE- no change Musculoskeletal:     Cervical back: Neck supple.     Comments: RUE- biceps 4+/5, Triceps 4+/5; WE 5-/5; Grip 4/5; FA 4-/5 LUE- Biceps, triceps 5-/5; WE 5-/5; Grip 4+/5; FA 4/5 RLE- HF 4-/5; otherwise 4/5 rest of RLE LLE- 4+/5 throughout , stable 3/6 Skin:    General: Skin is warm and dry.     Comments: R chest HD catheter- [place] on coccyx  Neurological:     Mental Status: He is oriented to person, place, and time.     Comments: Patient is alert.  Answers basic questions.  Limited medical historian.  Follows simple commands MAS of 2 in RLE-  few beats clonus B/L LE's and MAS of 1+ in LLE (+) hoffman's B/L 3+ DTRs B/L patella, stable 3/13 Psychiatric:        Mood and Affect: Mood normal.        Behavior: Behavior normal.     Assessment/Plan: 1. Functional deficits which require 3+ hours per day of interdisciplinary therapy in a comprehensive inpatient rehab setting. Physiatrist is providing close team supervision and 24 hour management of active medical problems listed below. Physiatrist and rehab team continue to assess barriers to discharge/monitor patient progress toward functional and medical goals  Care Tool:  Bathing    Body parts bathed by patient: Right arm, Chest, Abdomen, Front perineal area, Right upper leg, Left upper leg, Face   Body parts bathed by helper: Buttocks, Left arm, Left lower leg, Right lower leg Body parts n/a: Left arm, Buttocks, Left lower leg, Right lower leg   Bathing assist Assist Level: Moderate Assistance - Patient 50 - 74%     Upper Body Dressing/Undressing Upper body dressing  What is the patient wearing?: Pull over shirt    Upper body assist Assist Level: Minimal Assistance - Patient > 75%    Lower Body Dressing/Undressing Lower body dressing      What is the patient wearing?: Pants, Underwear/pull up     Lower body assist Assist for lower body dressing: Moderate Assistance - Patient 50 - 74%     Toileting Toileting    Toileting assist Assist for toileting: Moderate Assistance - Patient 50 - 74%     Transfers Chair/bed transfer  Transfers assist     Chair/bed transfer assist level: Minimal Assistance - Patient > 75%     Locomotion Ambulation   Ambulation assist      Assist level: Moderate Assistance - Patient 50 - 74% Assistive device: Walker-rolling Max distance: 53ft   Walk 10 feet activity   Assist     Assist level: Moderate Assistance - Patient - 50 - 74% Assistive device: Walker-rolling   Walk 50 feet activity   Assist Walk 50 feet  with 2 turns activity did not occur: Safety/medical concerns         Walk 150 feet activity   Assist Walk 150 feet activity did not occur: Safety/medical concerns         Walk 10 feet on uneven surface  activity   Assist Walk 10 feet on uneven surfaces activity did not occur: Safety/medical concerns         Wheelchair     Assist Is the patient using a wheelchair?: Yes Type of Wheelchair: Manual    Wheelchair assist level: Supervision/Verbal cueing Max wheelchair distance: 150'    Wheelchair 50 feet with 2 turns activity    Assist        Assist Level: Supervision/Verbal cueing   Wheelchair 150 feet activity     Assist      Assist Level: Supervision/Verbal cueing   Blood pressure 121/64, pulse 73, temperature 98.4 F (36.9 C), resp. rate 18, height 6\' 1"  (1.854 m), weight 85.6 kg, SpO2 100%.  Medical Problem List and Plan: 1. 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  Antithrombotics: -DVT/anticoagulation:  Pharmaceutical: Heparin             -antiplatelet therapy: Aspirin 81 mg daily 3. Pain Management: Tylenol as needed  3/9- no pain 4. Situational depression:  Neuropsych consulted. Offered chaplain consult but patient defers. Prozac 20 mg daily, Xanax 0.25 mg 3 times daily as needed  3/9- pt still depressed about SCI             -antipsychotic agents: N/A 5. Neuropsych/cognition: This patient is capable of making decisions on his own behalf. 6. Skin/Wound Care: Routine skin checks 7. Fluids/Electrolytes/Nutrition: Routine in and outs with follow-up chemistries 8.  End-stage renal disease.  Hemodialysis per renal services, continue  9.  Type 2 diabetes mellitus, hemoglobin A1c 5.6. Decrease glargine to 32U. D/c feeding supplement   10.  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  Discussed with Dr. Wynetta Emery neurosurgery placed on trial of steroids.  No current intervention at this time.  Needs to f/u with NSU after d/c.   11.  Anemia of chronic disease.  Follow-up CBC  12.  Glaucoma.  Legally blind left eye.  Continue eyedrops  13.  BPH. Continue Flomax 0.4 mg daily  14.  Hyperlipidemia.  Continue Lipitor  15.  Hypertension.  Monitor with increased mobility. Reviewed and well controlled  16.  Recent cholecystitis.  Status post cholecystostomy tube placement drainage of abscess 12/16.  Follow-up outpatient  17.  Acute diarrhea.    continue imodium, d/c senna  18. 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 Bradycardia: medications reviewed and he is not on any heart rate lowering agents  resolved  20. Poor intake: recommended eating fruits since these are filled with fiber and will bulk his stool  21. Leukocytosis:CBC reviewed and is increased  22. Decreased urinary output: UA/UC ordered    LOS: 9 days A FACE TO FACE EVALUATION WAS PERFORMED  Philip Benjamin P Philip Benjamin 01/01/2024, 11:52 AM

## 2024-01-01 NOTE — Evaluation (Signed)
 Recreational Therapy Assessment and Plan  Patient Details  Name: Philip Benjamin MRN: [id number] Date of Birth: 1951/07/01 Today's Date: 01/01/2024  Rehab Potential:  Good ELOS:   d/c 3/27  Assessment Hospital Problem: Principal Problem:   Left middle cerebral artery stroke [facility])     Past Medical History:      Past Medical History:  Diagnosis Date   BPH (benign prostatic hyperplasia)     Chronic kidney disease      ckd stage 3   Diabetes mellitus without complication (HCC)     Hypertension          Past Surgical History:       Past Surgical History:  Procedure Laterality Date   EYE SURGERY       INGUINAL HERNIA REPAIR Bilateral 01/23/2018    Procedure: BILATERAL OPEN INGUINAL HERNIA REPAIR WITH MESH;  Surgeon: Jimmye Norman, MD;  Location: [facility] OR;  Service: General;  Laterality: Bilateral;   INSERTION OF MESH Bilateral 01/23/2018    Procedure: INSERTION OF MESH;  Surgeon: Jimmye Norman, MD;  Location: [facility] OR;  Service: General;  Laterality: Bilateral;   IR FLUORO GUIDE CV LINE RIGHT   08/15/2023   IR PATIENT EVAL TECH 0-60 MINS   11/18/2023   IR PERC CHOLECYSTOSTOMY   10/06/2023   IR US GUIDE BX ASP/DRAIN   10/06/2023   IR US GUIDE VASC ACCESS RIGHT   08/15/2023   VASECTOMY        ~1985          Assessment & Plan Clinical Impression: Philip Benjamin is a 73 year old right-handed male with history significant for type 2 diabetes mellitus, anemia of chronic disease, BPH, hypertension, hyperlipidemia, end-stage renal disease with hemodialysis Monday Wednesday and Friday, glaucoma of the left eye/legally blind.  Per chart review patient lives with spouse and children.  Two-level home bed and bath main level and 5 steps to entry.  Household ambulator with rolling walker.  Daughter and brother can provide assistance on discharge.  Presented to [facility] 12/18/2023 with right side weakness x 48 hours as well as diarrhea.  Patient with recent admission 09/29/2023 - 10/10/2023  secondary to general malaise, nausea and weakness with nonproductive cough and was discharged to skilled nursing facility ambulating min mod assist 30 feet.  At that time workup demonstrated positive COVID-19 with mild dehydration and hypokalemia.  Patient also with recent admission 08/21/2023 - 08/25/2023 treated for sepsis secondary to Pseudomonas UTI and lobar pneumonia.  He was treated with IV cefepime and discharged with Cipro.  During patient's latest admission noted acute cholecystitis concern for abscess CT of abdomen showed distended gallbladder with pericholecystic stranding underwent cholecystostomy tube placement with drainage of abscess 12/16 completing course of Zosyn and was discharged for short time to skilled nursing facility before returning home.  Upon present admission of 12/18/2023 due to right sided weakness MRI of the brain showed a 3 mm acute infarct within the left lentiform nucleus.  Background parenchymal atrophy, advanced chronic small vessel ischemic disease and chronic infarcts.  Several nonspecific chronic microhemorrhages scattered within the supratentorial and infratentorial brain.  Carotid ultrasound without significant plaque formation.  MRA showed no evidence of intracranial large vessel occlusion or stenosis.  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  No findings to suggest acute disc herniation.  Multilevel foraminal stenosis greatest and severe on the left at C4-5 and C5-6.  Echocardiogram with ejection fraction of 60 to 65%.  The left ventricle showing no regional wall motion abnormalities.  Admission chemistries SARS coronavirus negative, BUN 20, creatinine 3.02, hemoglobin 10, hemoglobin A1c 5.6.  Neurology follow-up maintained on low-dose aspirin.  Subcutaneous heparin initiated for DVT prophylaxis.  Renal service follow-up with hemodialysis as directed.  Dr. Wynetta Emery of  neurosurgery consulted in regards to findings of severe spinal canal stenosis cord compression C3-4 placed on trial of oral steroids and close follow-up with possible need for surgical intervention.  Patient is tolerating a regular consistency diet.  Therapy evaluations completed due to patient decreased functional mobility and right-sided weakness was admitted for a comprehensive rehab program.Patient transferred to CIR on 12/23/2023 .     Pt presents with decreased activity tolerance, decreased functional mobility, decreased balance, decreased coordination, decreased vision, feelings of stress Limiting pt's independence with leisure/community pursuits. Met with pt today to discuss TR services including leisure education, activity analysis/modifications and stress management.  Also discussed the importance of social, emotional, spiritual health in addition to physical health and their effects on overall health and wellness.  Pt stated understanding.   Plan   Min 1 TR session >20 minutes during LOS Recommendations for other services: None   Discharge Criteria: Patient will be discharged from TR if patient refuses treatment 3 consecutive times without medical reason.  If treatment goals not met, if there is a change in medical status, if patient makes no progress towards goals or if patient is discharged from hospital.  The above assessment, treatment plan, treatment alternatives and goals were discussed and mutually agreed upon: by patient  Philip Benjamin 01/01/2024, 3:54 PM

## 2024-01-02 ENCOUNTER — Encounter (HOSPITAL_COMMUNITY)
Admission: AD | Disposition: A | Discharge status: Home Health | Payer: Self-pay | Source: Intra-hospital | Attending: Physical Medicine and Rehabilitation

## 2024-01-02 ENCOUNTER — Other Ambulatory Visit: Payer: Self-pay

## 2024-01-02 ENCOUNTER — Inpatient Hospital Stay (HOSPITAL_COMMUNITY): Admitting: Certified Registered Nurse Anesthetist

## 2024-01-02 ENCOUNTER — Encounter (HOSPITAL_COMMUNITY): Payer: Self-pay | Admitting: Physical Medicine and Rehabilitation

## 2024-01-02 DIAGNOSIS — F411 Generalized anxiety disorder: Secondary | ICD-10-CM

## 2024-01-02 DIAGNOSIS — I120 Hypertensive chronic kidney disease with stage 5 chronic kidney disease or end stage renal disease: Secondary | ICD-10-CM

## 2024-01-02 DIAGNOSIS — Z992 Dependence on renal dialysis: Secondary | ICD-10-CM

## 2024-01-02 DIAGNOSIS — E782 Mixed hyperlipidemia: Secondary | ICD-10-CM

## 2024-01-02 DIAGNOSIS — N186 End stage renal disease: Secondary | ICD-10-CM

## 2024-01-02 HISTORY — PX: AV FISTULA PLACEMENT: SHX1204

## 2024-01-02 LAB — POCT I-STAT, CHEM 8
BUN: 37 mg/dL — ABNORMAL HIGH (ref 8–23)
Calcium, Ion: 1.13 mmol/L — ABNORMAL LOW (ref 1.15–1.40)
Chloride: 98 mmol/L (ref 98–111)
Creatinine, Ser: 3 mg/dL — ABNORMAL HIGH (ref 0.61–1.24)
Glucose, Bld: 84 mg/dL (ref 70–99)
HCT: 31 % — ABNORMAL LOW (ref 39.0–52.0)
Hemoglobin: 10.5 g/dL — ABNORMAL LOW (ref 13.0–17.0)
Potassium: 4.3 mmol/L (ref 3.5–5.1)
Sodium: 131 mmol/L — ABNORMAL LOW (ref 135–145)
TCO2: 23 mmol/L (ref 22–32)

## 2024-01-02 LAB — RENAL FUNCTION PANEL
Albumin: 2.8 g/dL — ABNORMAL LOW (ref 3.5–5.0)
Anion gap: 13 (ref 5–15)
BUN: 40 mg/dL — ABNORMAL HIGH (ref 8–23)
CO2: 22 mmol/L (ref 22–32)
Calcium: 8.7 mg/dL — ABNORMAL LOW (ref 8.9–10.3)
Chloride: 98 mmol/L (ref 98–111)
Creatinine, Ser: 2.68 mg/dL — ABNORMAL HIGH (ref 0.61–1.24)
GFR, Estimated: 24 mL/min — ABNORMAL LOW (ref >60)
Glucose, Bld: 58 mg/dL — ABNORMAL LOW (ref 70–99)
Phosphorus: 3.1 mg/dL (ref 2.5–4.6)
Potassium: 4.2 mmol/L (ref 3.5–5.1)
Sodium: 133 mmol/L — ABNORMAL LOW (ref 135–145)

## 2024-01-02 LAB — CBC WITH DIFFERENTIAL/PLATELET
Abs Immature Granulocytes: 0.05 10*3/uL (ref 0.00–0.07)
Basophils Absolute: 0.1 10*3/uL (ref 0.0–0.1)
Basophils Relative: 1 %
Eosinophils Absolute: 0.2 10*3/uL (ref 0.0–0.5)
Eosinophils Relative: 2 %
HCT: 29.3 % — ABNORMAL LOW (ref 39.0–52.0)
Hemoglobin: 9.6 g/dL — ABNORMAL LOW (ref 13.0–17.0)
Immature Granulocytes: 1 %
Lymphocytes Relative: 10 %
Lymphs Abs: 1.1 10*3/uL (ref 0.7–4.0)
MCH: 30.6 pg (ref 26.0–34.0)
MCHC: 32.8 g/dL (ref 30.0–36.0)
MCV: 93.3 fL (ref 80.0–100.0)
Monocytes Absolute: 1.1 10*3/uL — ABNORMAL HIGH (ref 0.1–1.0)
Monocytes Relative: 11 %
Neutro Abs: 7.9 10*3/uL — ABNORMAL HIGH (ref 1.7–7.7)
Neutrophils Relative %: 75 %
Platelets: 190 10*3/uL (ref 150–400)
RBC: 3.14 MIL/uL — ABNORMAL LOW (ref 4.22–5.81)
RDW: 12.1 % (ref 11.5–15.5)
WBC: 10.3 10*3/uL (ref 4.0–10.5)
nRBC: 0 % (ref 0.0–0.2)

## 2024-01-02 LAB — GLUCOSE, CAPILLARY
Glucose-Capillary: 131 mg/dL — ABNORMAL HIGH (ref 70–99)
Glucose-Capillary: 49 mg/dL — ABNORMAL LOW (ref 70–99)
Glucose-Capillary: 56 mg/dL — ABNORMAL LOW (ref 70–99)
Glucose-Capillary: 66 mg/dL — ABNORMAL LOW (ref 70–99)
Glucose-Capillary: 78 mg/dL (ref 70–99)
Glucose-Capillary: 44 mg/dL — CL (ref 70–99)
Glucose-Capillary: 74 mg/dL (ref 70–99)
Glucose-Capillary: 85 mg/dL (ref 70–99)
Glucose-Capillary: 84 mg/dL (ref 70–99)

## 2024-01-02 SURGERY — ARTERIOVENOUS (AV) FISTULA CREATION
Anesthesia: Regional | Site: Arm Upper | Laterality: Left

## 2024-01-02 MED ORDER — CHLORHEXIDINE GLUCONATE 0.12 % MT SOLN: 15.0000 mL | Freq: Once | OROMUCOSAL | Status: AC

## 2024-01-02 MED ORDER — EPHEDRINE SULFATE-NACL 50-0.9 MG/10ML-% IV SOSY
PREFILLED_SYRINGE | INTRAVENOUS | Status: DC | PRN
  Administered 2024-01-02: 5 mg via INTRAVENOUS
  Administered 2024-01-02: 10 mg via INTRAVENOUS

## 2024-01-02 MED ORDER — INSULIN GLARGINE 100 UNIT/ML ~~LOC~~ SOLN
27.0000 [IU] | Freq: Every day | SUBCUTANEOUS | Status: DC
  Administered 2024-01-03: 27 [IU] via SUBCUTANEOUS
  Filled 2024-01-02: qty 0.27

## 2024-01-02 MED ORDER — FENTANYL CITRATE (PF) 100 MCG/2ML IJ SOLN
INTRAMUSCULAR | Status: AC
  Administered 2024-01-02: 50 ug
  Filled 2024-01-02: qty 2

## 2024-01-02 MED ORDER — HEPARIN 6000 UNIT IRRIGATION SOLUTION
Status: AC
  Filled 2024-01-02: qty 500

## 2024-01-02 MED ORDER — INSULIN GLARGINE 100 UNIT/ML ~~LOC~~ SOLN: 31.0000 [IU] | Freq: Every day | SUBCUTANEOUS | Status: DC

## 2024-01-02 MED ORDER — MIDAZOLAM HCL 2 MG/2ML IJ SOLN
INTRAMUSCULAR | Status: AC
  Filled 2024-01-02: qty 2

## 2024-01-02 MED ORDER — CHLORHEXIDINE GLUCONATE 0.12 % MT SOLN
OROMUCOSAL | Status: AC
  Administered 2024-01-02: 15 mL via OROMUCOSAL
  Filled 2024-01-02: qty 15

## 2024-01-02 MED ORDER — HEPARIN 6000 UNIT IRRIGATION SOLUTION
Status: DC | PRN
  Administered 2024-01-02: 1

## 2024-01-02 MED ORDER — CEFAZOLIN SODIUM-DEXTROSE 2-3 GM-%(50ML) IV SOLR
INTRAVENOUS | Status: DC | PRN
  Administered 2024-01-02: 2 g via INTRAVENOUS

## 2024-01-02 MED ORDER — DEXTROSE 50 % IV SOLN
INTRAVENOUS | Status: DC | PRN
  Administered 2024-01-02 (×2): 12.5 g via INTRAVENOUS

## 2024-01-02 MED ORDER — FENTANYL CITRATE PF 50 MCG/ML IJ SOSY: 50.0000 ug | PREFILLED_SYRINGE | Freq: Once | INTRAMUSCULAR | Status: DC

## 2024-01-02 MED ORDER — OXYCODONE-ACETAMINOPHEN 5-325 MG PO TABS
1.0000 | ORAL_TABLET | ORAL | Status: AC | PRN
  Administered 2024-01-03 – 2024-01-07 (×8): 1 via ORAL
  Filled 2024-01-02 (×8): qty 1

## 2024-01-02 MED ORDER — 0.9 % SODIUM CHLORIDE (POUR BTL) OPTIME
TOPICAL | Status: DC | PRN
  Administered 2024-01-02: 1000 mL

## 2024-01-02 MED ORDER — FENTANYL CITRATE (PF) 250 MCG/5ML IJ SOLN
INTRAMUSCULAR | Status: AC
  Filled 2024-01-02: qty 5

## 2024-01-02 MED ORDER — SODIUM CHLORIDE 0.9 % IV SOLN: INTRAVENOUS | Status: DC

## 2024-01-02 MED ORDER — PROPOFOL 500 MG/50ML IV EMUL
INTRAVENOUS | Status: DC | PRN
  Administered 2024-01-02: 40 ug/kg/min via INTRAVENOUS

## 2024-01-02 MED ORDER — ORAL CARE MOUTH RINSE: 15.0000 mL | Freq: Once | OROMUCOSAL | Status: AC

## 2024-01-02 MED ORDER — GLUCAGON HCL RDNA (DIAGNOSTIC) 1 MG IJ SOLR
1.0000 mg | INTRAMUSCULAR | Status: AC
  Administered 2024-01-02: 1 mg via INTRAMUSCULAR

## 2024-01-02 MED ORDER — LIDOCAINE-EPINEPHRINE (PF) 1.5 %-1:200000 IJ SOLN
INTRAMUSCULAR | Status: DC | PRN
Start: 2024-01-02 — End: 2024-01-02
  Administered 2024-01-02: 30 mL via PERINEURAL

## 2024-01-02 SURGICAL SUPPLY — 27 items
ARMBAND PINK RESTRICT EXTREMIT (MISCELLANEOUS) ×2 IMPLANT
BAG COUNTER SPONGE SURGICOUNT (BAG) ×1 IMPLANT
CANISTER SUCT 3000ML PPV (MISCELLANEOUS) ×1 IMPLANT
CLIP TI MEDIUM 6 (CLIP) ×1 IMPLANT
CLIP TI WIDE RED SMALL 6 (CLIP) ×1 IMPLANT
COVER PROBE W GEL 5X96 (DRAPES) ×1 IMPLANT
DERMABOND ADVANCED .7 DNX12 (GAUZE/BANDAGES/DRESSINGS) ×1 IMPLANT
ELECT REM PT RETURN 9FT ADLT (ELECTROSURGICAL) ×1 IMPLANT
ELECTRODE REM PT RTRN 9FT ADLT (ELECTROSURGICAL) ×1 IMPLANT
GLOVE SURG SS PI 7.5 STRL IVOR (GLOVE) ×3 IMPLANT
GOWN STRL REUS W/ TWL LRG LVL3 (GOWN DISPOSABLE) ×2 IMPLANT
GOWN STRL REUS W/ TWL XL LVL3 (GOWN DISPOSABLE) ×1 IMPLANT
HEMOSTAT SNOW SURGICEL 2X4 (HEMOSTASIS) IMPLANT
KIT BASIN OR (CUSTOM PROCEDURE TRAY) ×1 IMPLANT
KIT TURNOVER KIT B (KITS) ×1 IMPLANT
LOOP VESSEL MINI RED (MISCELLANEOUS) IMPLANT
NS IRRIG 1000ML POUR BTL (IV SOLUTION) ×1 IMPLANT
PACK CV ACCESS (CUSTOM PROCEDURE TRAY) ×1 IMPLANT
PAD ARMBOARD POSITIONER FOAM (MISCELLANEOUS) ×2 IMPLANT
SLING ARM FOAM STRAP LRG (SOFTGOODS) IMPLANT
SLING ARM FOAM STRAP MED (SOFTGOODS) IMPLANT
SUT PROLENE 6 0 CC (SUTURE) ×1 IMPLANT
SUT VIC AB 3-0 SH 27X BRD (SUTURE) ×1 IMPLANT
SUT VICRYL 4-0 PS2 18IN ABS (SUTURE) ×1 IMPLANT
TOWEL GREEN STERILE (TOWEL DISPOSABLE) ×1 IMPLANT
UNDERPAD 30X36 HEAVY ABSORB (UNDERPADS AND DIAPERS) ×1 IMPLANT
WATER STERILE IRR 1000ML POUR (IV SOLUTION) ×1 IMPLANT

## 2024-01-02 NOTE — Progress Notes (Signed)
 Physical Therapy Session Note  Patient Details  Name: Philip Benjamin MRN: [id number] Date of Birth: [birth year]  Today's Date: 01/02/2024 PT Individual Time: 4098-1191 PT Individual Time Calculation (min): 46 min  Today's Date: 01/02/2024 PT Missed Time: 29 Minutes Missed Time Reason: Patient fatigue  Short Term Goals: Week 1:  PT Short Term Goal 1 (Week 1): Pt will complete bed mobility with minA without hospial bed features PT Short Term Goal 1 - Progress (Week 1): Not met PT Short Term Goal 2 (Week 1): Pt will complete bed<>chair transfers with CGA and LRAD PT Short Term Goal 2 - Progress (Week 1): Not met PT Short Term Goal 3 (Week 1): Pt will ambulate 53ft with minA and LRAD PT Short Term Goal 3 - Progress (Week 1): Not met PT Short Term Goal 4 (Week 1): Pt will initiate stair training PT Short Term Goal 4 - Progress (Week 1): Not met PT Short Term Goal 5 (Week 1): Pt will improve BERG balance score by at least 8 points Week 2:  PT Short Term Goal 1 (Week 2): STG = LTG due to ELOS  Skilled Therapeutic Interventions/Progress Updates:   Received pt semi-reclined in bed. Pt reported feeling weak with no energy this morning and reported not getting breakfast tray. Increased time spent discussing with RN, nutrition services, and PA - pt NPO for fistula placement later this morning. Pt upset regarding current UTI and politely declining going to gym this morning.  Pt then reported feeling lightheaded/dizzy - BP: 118/64, 73bpm, and SPO2 100%. Pt reported urge to void - transferred semi-reclined<>sitting R EOB with HOB elevated and use of bedrails with supervision. Stood with RW and min A and voided in urinal with min A for balance and assist to hold urinal. Donned pants sitting EOB with max A and required max A to pull brief/pants over hips. Pt performed the following exercises with emphasis on LE strength/ROM:  -seated knee extension x10 bilaterally  -seated hip flexion x10  bilaterally -seated active dorsiflexion ROM x10 bilaterally  Worked on sit<>stands with RW and min A x 5 trials - cues to scoot to EOB, get feet underneath him, for hand placement on bed/RW, and for glute activation. Pt politely declined any further activity due to fatigue and requested to lie down - sit<>supine with supervision. Concluded session with pt semi-reclined in bed, needs within reach, and bed alarm on. 29 minutes missed of skilled physical therapy due to fatigue.   Therapy Documentation Precautions:  Precautions Precautions: Fall Precaution/Restrictions Comments: R hemi Restrictions Weight Bearing Restrictions Per Provider Order: No Other Position/Activity Restrictions: r hemiparesis  Therapy/Group: Individual Therapy Marlana Salvage Zaunegger Blima Rich PT, DPT 01/02/2024, 7:00 AM

## 2024-01-02 NOTE — Op Note (Signed)
    Patient name: Philip Benjamin MRN: [id number] DOB: [birth year] Sex: male  01/02/2024 Pre-operative Diagnosis: ESRD Post-operative diagnosis:  Same Surgeon:  [name] Assistants:  Doreatha Massed, PA Procedure:   Left brachiobasilic fistula ( first stage) Anesthesia:  Regional Blood Loss:  minimal Specimens:  none  Findings: 4 mm basilic vein, 4 mm disease-free artery  Indications: This is a 73 year old gentleman with end-stage renal disease who dialyzes through a catheter.  He is right-handed.  Preoperative vein mapping showed a adequate basilic vein.  He comes in today for fistula creation.  Procedure:  The patient was identified in the holding area and taken to [facility] OR ROOM 11  The patient was then placed supine on the table. regional anesthesia was administered.  The patient was prepped and draped in the usual sterile fashion.  A time out was called and antibiotics were administered.  A PA was necessary to expedite the procedure and assist with technical details.  She helped with exposure by providing suction and retraction.  She helped with the anastomosis by following suture.  Ultrasound was used to evaluate the surface veins in the left arm.  The basilic vein was the best vein for fistula creation measuring about 4 mm.  An oblique incision was made just above the antecubital crease on the left.  I first dissected out the brachial artery which was a 4 mm disease-free artery.  I then dissected out the basilic vein.  This was a 4 mm vein.  The nerve was protected.  The vein was marked for orientation and then ligated distally with a 2-0 silk tie.  The vein distended nicely with heparin saline.  I then occluded the brachial artery with vascular clamps and made an arteriotomy with an 11 blade which was extended longitudinally with Potts scissors.  The vein was cut the appropriate length and spatulated at this of the arteriotomy.  A running anastomosis was created with 6-0 Prolene.  Prior to  completion, the appropriate flushing maneuvers were performed and the anastomosis was completed.  I then inspected the course of the vein.  The nerve coursing along the vein was tethering the vein and creating a significant stenosis.  I felt the best way to address this is to redo the anastomosis and placed the vein anterior to the nerve.  I reoccluded the artery and took down the previous anastomosis with an 11 blade.  The vein was then brought over top of the nerve.  I trimmed the vein a bit and then redid the anastomosis with 6-0 Prolene.  Once this was completed, the patient had a palpable radial pulse.  There was an excellent thrill in the vein and no narrowing.  The wound was irrigated.  Hemostasis was achieved.  The incision was closed with a deep layer of 3-0 Vicryl and a subcuticular stitch followed by Dermabond.  There were no immediate complications.   Disposition: To PACU stable.   Juleen China, M.D., [facility] [place] Office: [phone] Pager:  [phone]

## 2024-01-02 NOTE — Interval H&P Note (Signed)
 History and Physical Interval Note:  01/02/2024 2:08 PM  Philip Benjamin  has presented today for surgery, with the diagnosis of ESRD.  The various methods of treatment have been discussed with the patient and family. After consideration of risks, benefits and other options for treatment, the patient has consented to  Procedure(s): ARTERIOVENOUS (AV) FISTULA CREATION (Left) as a surgical intervention.  The patient's history has been reviewed, patient examined, no change in status, stable for surgery.  I have reviewed the patient's chart and labs.  Questions were answered to the patient's satisfaction.     [name]

## 2024-01-02 NOTE — Progress Notes (Signed)
 [facility] Progress Note   Subjective: Ongoing fatigue and diarrhea but no other complaints  Objective Vitals:   01/01/24 1421 01/01/24 1920 01/02/24 0436 01/02/24 1159  BP: 116/64 122/71 (!) 158/69 128/71  Pulse: 64 74 69 67  Resp: 18 16 18 16   Temp: (!) 96.2 F (35.7 C) 98.6 F (37 C) 98.5 F (36.9 C) 98 F (36.7 C)  TempSrc:  Oral    SpO2: 100% 100% 100% 100%  Weight:      Height:       Physical Exam General: Elderly man, NAD. Room air Heart: normal rate Lungs: Bilateral chest rise with no increased work of breathing Extremities: no edema, warm and well-perfused Dialysis Access: [facility]  Additional Objective Labs: Basic Metabolic Panel: Recent Labs  Lab 12/29/23 1430 12/31/23 1110  NA 127* 131*  K 4.3 4.0  CL 95* 99  CO2 21* 24  GLUCOSE 230* 111*  BUN 78* 50*  CREATININE 3.25* 2.81*  CALCIUM 8.5* 9.0  PHOS 3.9 3.0   Liver Function Tests: Recent Labs  Lab 12/29/23 1430 12/31/23 1110  ALBUMIN 2.8* 3.0*   CBC: Recent Labs  Lab 12/29/23 1430 12/31/23 1110 01/01/24 0628 01/02/24 0902  WBC 9.1 14.9* 15.6* 10.3  NEUTROABS  --   --  12.3* 7.9*  HGB 9.1* 9.9* 10.0* 9.6*  HCT 27.5* 29.7* 30.4* 29.3*  MCV 92.6 92.2 91.6 93.3  PLT 177 199 198 190   Blood Culture    Component Value Date/Time   SDES URINE, CLEAN CATCH 01/01/2024 1022   SPECREQUEST  01/01/2024 1022    NONE Performed at [facility], [address] [place], [place] [address]    CULT >=100,000 COLONIES/mL KLEBSIELLA PNEUMONIAE (A) 01/01/2024 1022   REPTSTATUS PENDING 01/01/2024 1022   Medications:    amLODipine  5 mg Oral Daily   aspirin  81 mg Oral Daily   atorvastatin  40 mg Oral Daily   cephALEXin  500 mg Oral Q12H   Chlorhexidine Gluconate Cloth  6 each Topical Q12H   cholecalciferol  1,000 Units Oral Daily   cycloSPORINE  1 drop Both Eyes BID   darbepoetin (ARANESP) injection - DIALYSIS  60 mcg Subcutaneous Q Thu-1800   dorzolamide  1 drop Both Eyes  BID   And   timolol  1 drop Both Eyes BID   FLUoxetine  20 mg Oral Daily   heparin  5,000 Units Subcutaneous Q8H   heparin sodium (porcine)  3,800 Units Intracatheter Once   [START ON 01/03/2024] insulin glargine  27 Units Subcutaneous Daily   latanoprost  1 drop Left Eye QHS   multivitamin  1 tablet Oral QHS   tamsulosin  0.4 mg Oral Q supper   tiZANidine  2 mg Oral QHS    Dialysis Orders MWF - [facility] 4hr, 350/500, EDW 86.5kg, 2K/2.5Ca, TDC, heparin 1000U/hr - Venofer 50mg  IV q Monday - Mircera IV q 4 weeks (last 2/17)   Assessment/Plan: Hx acute L lentiform nucleus CVA: Now in CIR getting PT/OT.  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 Hx cholecystitis 09/2023: S/p IR chole tube, now removed. ESRD: Continue HD on MWF schedule. AVG/F creation with [facility] today, appreciate help, HD could be delayed to Saturday if needed HTN/volume: BP stable. UF as tolerated. Anemia of ESRD: Hgb near goal, iron replete, on esa Secondary HPTH: Ca/Phos ok - not on binders, on Vit D3. Nutrition: Alb low, continue protein supplements. Diarrhea: per primary,  seems improved T2DM Dispo: Pending d/c date 3/20.   01/02/2024, 12:29 PM  [facility]

## 2024-01-02 NOTE — Progress Notes (Addendum)
 PROGRESS NOTE   Subjective/Complaints: NPO for fistula today Feeling weak, nursing to check CBG, discussed they were much better yesterday, lantus decreased to 31U   ROS:  Pt denies SOB, abd pain, CP, and vision changes, acute pain  +malaise, +depression- situational, +diarrhea, +fatigue, +generalized weakness   Objective:   VAS [place] UPPER EXT VEIN MAPPING (PRE-OP AVF) Result Date: 01/01/2024 UPPER EXTREMITY VEIN MAPPING Patient Name:  Philip Benjamin  Date of Exam:   01/01/2024 Medical Rec #: [id number]         Accession #:    [id number] Date of Birth: 1951/05/07        Patient Gender: M Patient Age:   73 years Exam Location:  [facility] Procedure:      VAS [place] UPPER EXT VEIN MAPPING (PRE-OP AVF) Referring Phys: Emilie Rutter --------------------------------------------------------------------------------  Indications: Pre-access. Performing Technologist: Dondra Prader RVT  Examination Guidelines: A complete evaluation includes B-mode imaging, spectral Doppler, color Doppler, and power Doppler as needed of all accessible portions of each vessel. Bilateral testing is considered an integral part of a complete examination. Limited examinations for reoccurring indications may be performed as noted. +-----------------+-------------+----------+--------------+ Right Cephalic   Diameter (cm)Depth (cm)   Findings    +-----------------+-------------+----------+--------------+ Shoulder                                not visualized +-----------------+-------------+----------+--------------+ Prox upper arm                          not visualized +-----------------+-------------+----------+--------------+ Mid upper arm                           not visualized +-----------------+-------------+----------+--------------+ Dist upper arm                          not visualized  +-----------------+-------------+----------+--------------+ Antecubital fossa                       not visualized +-----------------+-------------+----------+--------------+ Prox forearm         0.27       19.00                  +-----------------+-------------+----------+--------------+ Mid forearm          0.20        0.30     branching    +-----------------+-------------+----------+--------------+ Dist forearm                            not visualized +-----------------+-------------+----------+--------------+ Wrist                                   not visualized +-----------------+-------------+----------+--------------+ +--------------+-------------+----------+--------------+ Right Basilic Diameter (cm)Depth (cm)   Findings    +--------------+-------------+----------+--------------+ Shoulder  not visualized +--------------+-------------+----------+--------------+ Prox upper arm    0.25        1.00                  +--------------+-------------+----------+--------------+ Mid upper arm     0.27        0.80                  +--------------+-------------+----------+--------------+ Dist upper arm    0.35        0.60     branching    +--------------+-------------+----------+--------------+ Prox forearm                         not visualized +--------------+-------------+----------+--------------+ Mid forearm                          not visualized +--------------+-------------+----------+--------------+ Distal forearm                       not visualized +--------------+-------------+----------+--------------+ Elbow                                not visualized +--------------+-------------+----------+--------------+ Wrist                                not visualized +--------------+-------------+----------+--------------+ +-----------------+-------------+----------+--------------+ Left Cephalic    Diameter  (cm)Depth (cm)   Findings    +-----------------+-------------+----------+--------------+ Shoulder             0.20        0.70                  +-----------------+-------------+----------+--------------+ Mid upper arm        0.16        0.20                  +-----------------+-------------+----------+--------------+ Dist upper arm       0.07                 branching    +-----------------+-------------+----------+--------------+ Antecubital fossa    0.34        0.40                  +-----------------+-------------+----------+--------------+ Prox forearm         0.20        0.40                  +-----------------+-------------+----------+--------------+ Mid forearm          0.19        0.40                  +-----------------+-------------+----------+--------------+ Dist forearm         0.18        0.18                  +-----------------+-------------+----------+--------------+ Wrist                                   not visualized +-----------------+-------------+----------+--------------+ +--------------+-------------+----------+---------+ Left Basilic  Diameter (cm)Depth (cm)Findings  +--------------+-------------+----------+---------+ Prox upper arm    0.33        1.40   branching +--------------+-------------+----------+---------+ Mid upper arm     0.33  0.44   branching +--------------+-------------+----------+---------+ Dist upper arm    0.34        0.08   branching +--------------+-------------+----------+---------+ Prox forearm      0.24        0.50   branching +--------------+-------------+----------+---------+ Mid forearm       0.10        0.24   branching +--------------+-------------+----------+---------+ *See table(s) above for measurements and observations.  Diagnosing physician: Sherald Hess MD Electronically signed by Sherald Hess MD on 01/01/2024 at 10:07:56 PM.    Final    Recent Labs    01/01/24 0628  01/02/24 0902  WBC 15.6* 10.3  HGB 10.0* 9.6*  HCT 30.4* 29.3*  PLT 198 190    Recent Labs    12/31/23 1110  NA 131*  K 4.0  CL 99  CO2 24  GLUCOSE 111*  BUN 50*  CREATININE 2.81*  CALCIUM 9.0     Intake/Output Summary (Last 24 hours) at 01/02/2024 1153 Last data filed at 01/02/2024 0857 Gross per 24 hour  Intake 480 ml  Output 25 ml  Net 455 ml         Physical Exam: Vital Signs Blood pressure (!) 158/69, pulse 69, temperature 98.5 F (36.9 C), resp. rate 18, height 6\' 1"  (1.854 m), weight 85.6 kg, SpO2 100%.     General: awake, alert, appropriate, sitting up in w/c; NAD HENT: conjugate gaze; oropharynx moist CV: regular rate and rhythm; no JVD Pulmonary: CTA B/L; no W/R/R- good air movement GI: soft, NT, ND, (+)BS- more normoactive Psychiatric: appropriate- interactive Neurological: Ox3 MAS of 1+ to 2 in RLE and 1+ in LLE- no change Musculoskeletal:     Cervical back: Neck supple.     Comments: RUE- biceps 4+/5, Triceps 4+/5; WE 5-/5; Grip 4/5; FA 4-/5 LUE- Biceps, triceps 5-/5; WE 5-/5; Grip 4+/5; FA 4/5 RLE- HF 4-/5; otherwise 4/5 rest of RLE LLE- 4+/5 throughout Skin:    General: Skin is warm and dry.     Comments: R chest HD catheter- [place] on coccyx  Neurological:     Mental Status: He is oriented to person, place, and time.     Comments: Patient is alert.  Answers basic questions.  Limited medical historian.  Follows simple commands MAS of 2 in RLE- few beats clonus B/L LE's and MAS of 1+ in LLE (+) hoffman's B/L 3+ DTRs B/L patella, stable 3/14 Psychiatric:        Mood and Affect: Mood normal.        Behavior: Behavior normal.     Assessment/Plan: 1. Functional deficits which require 3+ hours per day of interdisciplinary therapy in a comprehensive inpatient rehab setting. Physiatrist is providing close team supervision and 24 hour management of active medical problems listed below. Physiatrist and rehab team continue to assess barriers  to discharge/monitor patient progress toward functional and medical goals  Care Tool:  Bathing    Body parts bathed by patient: Right arm, Chest, Abdomen, Front perineal area, Right upper leg, Left upper leg, Face   Body parts bathed by helper: Buttocks, Left arm, Left lower leg, Right lower leg Body parts n/a: Left arm, Buttocks, Left lower leg, Right lower leg   Bathing assist Assist Level: Moderate Assistance - Patient 50 - 74%     Upper Body Dressing/Undressing Upper body dressing   What is the patient wearing?: Pull over shirt    Upper body assist Assist Level: Minimal Assistance - Patient > 75%    Lower Body  Dressing/Undressing Lower body dressing      What is the patient wearing?: Pants, Underwear/pull up     Lower body assist Assist for lower body dressing: Moderate Assistance - Patient 50 - 74%     Toileting Toileting    Toileting assist Assist for toileting: Moderate Assistance - Patient 50 - 74%     Transfers Chair/bed transfer  Transfers assist     Chair/bed transfer assist level: Minimal Assistance - Patient > 75%     Locomotion Ambulation   Ambulation assist      Assist level: Moderate Assistance - Patient 50 - 74% Assistive device: Walker-rolling Max distance: 71ft   Walk 10 feet activity   Assist     Assist level: Moderate Assistance - Patient - 50 - 74% Assistive device: Walker-rolling   Walk 50 feet activity   Assist Walk 50 feet with 2 turns activity did not occur: Safety/medical concerns         Walk 150 feet activity   Assist Walk 150 feet activity did not occur: Safety/medical concerns         Walk 10 feet on uneven surface  activity   Assist Walk 10 feet on uneven surfaces activity did not occur: Safety/medical concerns         Wheelchair     Assist Is the patient using a wheelchair?: Yes Type of Wheelchair: Manual    Wheelchair assist level: Supervision/Verbal cueing Max wheelchair  distance: 150'    Wheelchair 50 feet with 2 turns activity    Assist        Assist Level: Supervision/Verbal cueing   Wheelchair 150 feet activity     Assist      Assist Level: Supervision/Verbal cueing   Blood pressure (!) 158/69, pulse 69, temperature 98.5 F (36.9 C), resp. rate 18, height 6\' 1"  (1.854 m), weight 85.6 kg, SpO2 100%.  Medical Problem List and Plan: 1. 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  Antithrombotics: -DVT/anticoagulation:  Pharmaceutical: Heparin             -antiplatelet therapy: Aspirin 81 mg daily 3. Pain Management: Tylenol as needed  3/9- no pain 4. Situational depression:  Neuropsych consulted. Offered chaplain consult but patient defers. Prozac 20 mg daily, Xanax 0.25 mg 3 times daily as needed  3/9- pt still depressed about SCI             -antipsychotic agents: N/A 5. Neuropsych/cognition: This patient is capable of making decisions on his own behalf. 6. Skin/Wound Care: Routine skin checks 7. Fluids/Electrolytes/Nutrition: Routine in and outs with follow-up chemistries 8.  End-stage renal disease.  Hemodialysis per renal services, continue  9.  Type 2 diabetes mellitus, hemoglobin A1c 5.6. Decrease glargine to 27U. D/c feeding supplement   10.  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  Discussed with Dr. Wynetta Emery neurosurgery placed on trial of steroids.  No current intervention at this time.  Needs to f/u with NSU after d/c.   11.  Anemia of chronic disease.  Follow-up CBC  12.  Glaucoma.  Legally blind left eye.  Continue eyedrops  13.  BPH. Continue Flomax 0.4 mg daily  14.  Hyperlipidemia.  Continue Lipitor  15.  Hypertension.  Monitor with increased mobility. Reviewed and well controlled  16.  Recent cholecystitis.  Status post cholecystostomy tube  placement drainage of abscess 12/16.  Follow-up outpatient  17.  Acute diarrhea.    continue imodium, d/c senna  18. 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 Bradycardia: medications reviewed and he is not on any heart rate lowering agents  resolved  20. Poor intake: recommended eating fruits since these are filled with fiber and will bulk his stool  21. Leukocytosis: resolved, continue keflex  22. Decreased urinary output: UA/UC ordered, discussed UA is positive, keflex started, f/u UC  23. Generalized weakness: asked nursing to check CBG since NPO for fistula placement, CBG is 56, decreased glargine to 27U  24. Decreased urination: discussed this could be 2/2 UTI or HD, continue keflex    LOS: 10 days A FACE TO FACE EVALUATION WAS PERFORMED  Clint Bolder P Deandrae Wajda 01/02/2024, 11:53 AM

## 2024-01-02 NOTE — Progress Notes (Signed)
 Physical Therapy Session Note  Patient Details  Name: Philip Benjamin MRN: [id number] Date of Birth: 1951-02-21  Today's Date: 01/02/2024 PT Individual Time: 1100-1140 PT Individual Time Calculation (min): 40 min  and Today's Date: 01/02/2024 PT Missed Time: 20 Minutes Missed Time Reason: Patient fatigue  Short Term Goals: Week 2:  PT Short Term Goal 1 (Week 2): STG = LTG due to ELOS  Skilled Therapeutic Interventions/Progress Updates:      Pt in bed, appearing uncomfortable with grimacing facing and moaning in discomfort. Pt reports need/urge to void but has been unable to. Offered to take to the bathroom to assist with toileting and continued efforts to void.   Supine<>Sitting EOB with supervision assist with hospital bed features. From raised EOB, he completes stand pivot transfer with minA to wheelchair. Wheeled into the bathroom and patient assisted to toilet with minA stand pivot while grabbing/pulling from grab bars in the bathroom. Assist needed for managing briefs/pants in standing.   Changed brief's to clean ones with totalA. Returned to wheelchair with minA stand pivot transfer using the grab bars.   Wheeled to day room rehab gym and assisted onto the Nustep with heavy minA transfer. SEtupA for BLE 2/2 fatigue and weakness. Pt able to complete x3.5 minutes at L6 resistance - typically able to complete more than this. Pt reports feeling very weak and tired, politely refusing further therapy.   Returned to his room and assisted back to bed with modA stand pivot transfer. Assist for returning to supine. Messaged medical team regarding weakness - medical team confirms NPO for procedure later today. NT to check blood sugar who later reports hypoglycemia. Pt missed 20 minutes of therapy due to fatigue.   Therapy Documentation Precautions:  Precautions Precautions: Fall Precaution/Restrictions Comments: R hemi Restrictions Weight Bearing Restrictions Per Provider Order: No Other  Position/Activity Restrictions: r hemiparesis General:     Therapy/Group: Individual Therapy  [name] 01/02/2024, 7:46 AM

## 2024-01-02 NOTE — Progress Notes (Signed)
 Occupational Therapy Session Note  Patient Details  Name: Philip Benjamin MRN: [id number] Date of Birth: [birth year]  Today's Date: 01/02/2024 OT Individual Time: 4098-1191 OT Individual Time Calculation (min): 55 min    Short Term Goals: Week 2:  OT Short Term Goal 1 (Week 2): Patient will complete stand step toilet transfer with close supervision OT Short Term Goal 2 (Week 2): Pt will pull pants up/down over hips with CGA usign LRAD OT Short Term Goal 3 (Week 2): Pt will donn pull over head shirt with supervision consistently OT Short Term Goal 4 (Week 2): Pt tolerate standing >2 minutes during BADLs or functional activity using LRAD CGA  Skilled Therapeutic Interventions/Progress Updates:  Skilled OT intervention completed with focus on RUE NMR, activity tolerance. Pt received upright in bed, c/o fatigue and discomfort from UTI and NPO status for upcoming fistula procedure. Pt agreeable only to EOB therapy. No pain reported.  Transitioned to EOB with supervision with [facility] and using bed rails. Cues needed to place BLE underneath him for supportive BOS for sitting. Pt verbalized urgency to void, but declined trial at it due to inability to go in previous trials. MD present for rounds, indicating pt may have greater difficulty passing due to HD treatment.   Sitting EOB, pt participated in the following activities to promote bilateral/RUE NMR, FM and GM coordination skills needed for BADLs: -using BUE to place/retrieve pegs from pegboard. Pt unable to visually follow design card due to low vision, but with well lit room, pt was able to complete task with RUE noted to have less dexterity/slower than LUE but both relatively functional -card matching game. Using RUE predominantly, but BUE with fatigue to flip 2 cards over at time to find a pair. Pt was able to flip cards with + time due to FM challenge, and needed intermittent min cues for following rule of game to only have 2 turned over at  time  We discussed household items and tasks pt can engage in order to work on FM and dexterity such as crumpling paper, picking up coins, flipping cards etc.  Pt required mod A to transition sit > supine for BLE. Pt remained upright in bed, with bed alarm on/activated, and with all needs in reach at end of session.   Therapy Documentation Precautions:  Precautions Precautions: Fall Precaution/Restrictions Comments: R hemi Restrictions Weight Bearing Restrictions Per Provider Order: No Other Position/Activity Restrictions: r hemiparesis    Therapy/Group: Individual Therapy  Melvyn Novas, MS, OTR/L  01/02/2024, 12:12 PM

## 2024-01-02 NOTE — Anesthesia Procedure Notes (Signed)
 Anesthesia Regional Block: Supraclavicular block   Pre-Anesthetic Checklist: , timeout performed,  Correct Patient, Correct Site, Correct Laterality,  Correct Procedure, Correct Position, site marked,  Risks and benefits discussed,  Surgical consent,  Pre-op evaluation,  At surgeon's request and post-op pain management  Laterality: Left  Prep: chloraprep       Needles:  Injection technique: Single-shot  Needle Type: Echogenic Stimulator Needle     Needle Length: 9cm  Needle Gauge: 21     Additional Needles:   Procedures:,,,, ultrasound used (permanent image in chart),,    Narrative:  Start time: 01/02/2024 2:25 PM End time: 01/02/2024 2:35 PM Injection made incrementally with aspirations every 5 mL.  Performed by: Personally  Anesthesiologist: Leonides Grills, MD  Additional Notes: Functioning IV was confirmed and monitors were applied.  A timeout was performed. Sterile prep, hand hygiene and sterile gloves were used. A 90mm 21ga Arrow echogenic stimulator needle was used. Negative aspiration and negative test dose prior to incremental administration of local anesthetic. The patient tolerated the procedure well.  Ultrasound guidance: relevent anatomy identified, needle position confirmed, local anesthetic spread visualized around nerve(s), vascular puncture avoided.  Image printed for medical record.

## 2024-01-02 NOTE — Transfer of Care (Signed)
 Immediate Anesthesia Transfer of Care Note  Patient: Philip Benjamin  Procedure(s) Performed: ARTERIOVENOUS (AV) FISTULA CREATION (Left: Arm Upper)  Patient Location: PACU  Anesthesia Type:General  Level of Consciousness: sleeping, drowsy  Airway & Oxygen Therapy: Patient Spontanous Breathing and Patient connected to face mask oxygen  Post-op Assessment: Report given to RN and Post -op Vital signs reviewed and stable  Post vital signs: Reviewed and stable  Last Vitals:  Vitals Value Taken Time  BP 106/58 01/02/24 1640  Temp    Pulse 67 01/02/24 1643  Resp 14 01/02/24 1643  SpO2 99 % 01/02/24 1643  Vitals shown include unfiled device data.  Last Pain:  Vitals:   01/02/24 1401  TempSrc:   PainSc: 0-No pain         Complications: No notable events documented.

## 2024-01-02 NOTE — Progress Notes (Signed)
 IVT consult placed for PIV placement due to hypoglycemia and no IV access. Patient received IM glucogon injection and is requesting to wait for placement. He reports feeling 'fine' and doesn't want an IV if team can wait. [place] sent to RN to place new consult if needs change.  Harinder Romas Loyola Mast, RN

## 2024-01-02 NOTE — Anesthesia Preprocedure Evaluation (Addendum)
 Anesthesia Evaluation  Patient identified by MRN, date of birth, ID band Patient awake    Reviewed: Allergy & Precautions, NPO status , Patient's Chart, lab work & pertinent test results  Airway Mallampati: II  TM Distance: >3 FB Neck ROM: Full    Dental no notable dental hx.    Pulmonary neg pulmonary ROS   Pulmonary exam normal        Cardiovascular hypertension, Normal cardiovascular exam     Neuro/Psych Glaucoma CVA  negative psych ROS   GI/Hepatic negative GI ROS, Neg liver ROS,,,  Endo/Other  diabetes, Insulin Dependent    Renal/GU ESRFRenal disease     Musculoskeletal negative musculoskeletal ROS (+)    Abdominal   Peds  Hematology  (+) Blood dyscrasia, anemia   Anesthesia Other Findings End stage renal disease   Reproductive/Obstetrics                             Anesthesia Physical Anesthesia Plan  ASA: 3  Anesthesia Plan: Regional   Post-op Pain Management:    Induction: Intravenous  PONV Risk Score and Plan: 1 and Ondansetron, Dexamethasone, Propofol infusion and Treatment may vary due to age or medical condition  Airway Management Planned: Simple Face Mask  Additional Equipment:   Intra-op Plan:   Post-operative Plan:   Informed Consent: I have reviewed the patients History and Physical, chart, labs and discussed the procedure including the risks, benefits and alternatives for the proposed anesthesia with the patient or authorized representative who has indicated his/her understanding and acceptance.     Dental advisory given  Plan Discussed with: CRNA  Anesthesia Plan Comments:        Anesthesia Quick Evaluation

## 2024-01-02 NOTE — Anesthesia Procedure Notes (Signed)
 Procedure Name: MAC Date/Time: 01/02/2024 2:58 PM  Performed by: Marena Chancy, CRNAPre-anesthesia Checklist: Patient identified, Emergency Drugs available, Suction available, Patient being monitored and Timeout performed Patient Re-evaluated:Patient Re-evaluated prior to induction Oxygen Delivery Method: Simple face mask Preoxygenation: Pre-oxygenation with 100% oxygen Induction Type: IV induction

## 2024-01-02 NOTE — Significant Event (Signed)
 Hypoglycemic Event  CBG: 56  Treatment: Glucagon IM 1 mg  Symptoms:  asymptomatic  Follow-up CBG: Time:70min CBG Result: 78  Possible Reasons for Event: Other: NPO  Comments/MD notified: yes, patient feels fine and blood sugar slowly rising    Laurel Dimmer

## 2024-01-03 DIAGNOSIS — Z794 Long term (current) use of insulin: Secondary | ICD-10-CM

## 2024-01-03 DIAGNOSIS — M4802 Spinal stenosis, cervical region: Secondary | ICD-10-CM

## 2024-01-03 DIAGNOSIS — F411 Generalized anxiety disorder: Secondary | ICD-10-CM

## 2024-01-03 DIAGNOSIS — G992 Myelopathy in diseases classified elsewhere: Secondary | ICD-10-CM

## 2024-01-03 DIAGNOSIS — E11649 Type 2 diabetes mellitus with hypoglycemia without coma: Secondary | ICD-10-CM

## 2024-01-03 LAB — GLUCOSE, CAPILLARY
Glucose-Capillary: 27 mg/dL — CL (ref 70–99)
Glucose-Capillary: 80 mg/dL (ref 70–99)
Glucose-Capillary: 128 mg/dL — ABNORMAL HIGH (ref 70–99)
Glucose-Capillary: 120 mg/dL — ABNORMAL HIGH (ref 70–99)
Glucose-Capillary: 139 mg/dL — ABNORMAL HIGH (ref 70–99)

## 2024-01-03 LAB — CBC WITH DIFFERENTIAL/PLATELET
Abs Immature Granulocytes: 0.06 10*3/uL (ref 0.00–0.07)
Basophils Absolute: 0 10*3/uL (ref 0.0–0.1)
Basophils Relative: 0 %
Eosinophils Absolute: 0.2 10*3/uL (ref 0.0–0.5)
Eosinophils Relative: 2 %
HCT: 31.1 % — ABNORMAL LOW (ref 39.0–52.0)
Hemoglobin: 10.3 g/dL — ABNORMAL LOW (ref 13.0–17.0)
Immature Granulocytes: 1 %
Lymphocytes Relative: 9 %
Lymphs Abs: 0.9 10*3/uL (ref 0.7–4.0)
MCH: 30.7 pg (ref 26.0–34.0)
MCHC: 33.1 g/dL (ref 30.0–36.0)
MCV: 92.6 fL (ref 80.0–100.0)
Monocytes Absolute: 1.2 10*3/uL — ABNORMAL HIGH (ref 0.1–1.0)
Monocytes Relative: 11 %
Neutro Abs: 8.3 10*3/uL — ABNORMAL HIGH (ref 1.7–7.7)
Neutrophils Relative %: 77 %
Platelets: 193 10*3/uL (ref 150–400)
RBC: 3.36 MIL/uL — ABNORMAL LOW (ref 4.22–5.81)
RDW: 12 % (ref 11.5–15.5)
WBC: 10.6 10*3/uL — ABNORMAL HIGH (ref 4.0–10.5)
nRBC: 0 % (ref 0.0–0.2)

## 2024-01-03 LAB — URINE CULTURE: Culture: >=100000 — AB

## 2024-01-03 LAB — OCCULT BLOOD X 1 CARD TO LAB, STOOL: Fecal Occult Bld: NEGATIVE

## 2024-01-03 MED ORDER — GLUCOSE 40 % PO GEL: 2.0000 | ORAL | Status: AC

## 2024-01-03 MED ORDER — INSULIN GLARGINE 100 UNIT/ML ~~LOC~~ SOLN
20.0000 [IU] | Freq: Every day | SUBCUTANEOUS | Status: DC
  Administered 2024-01-04: 20 [IU] via SUBCUTANEOUS
  Filled 2024-01-03 (×2): qty 0.2

## 2024-01-03 MED ORDER — HEPARIN SODIUM (PORCINE) 1000 UNIT/ML IJ SOLN
INTRAMUSCULAR | Status: AC
  Filled 2024-01-03: qty 4

## 2024-01-03 NOTE — Anesthesia Postprocedure Evaluation (Signed)
 Anesthesia Post Note  Patient: Philip Benjamin  Procedure(s) Performed: ARTERIOVENOUS (AV) FISTULA CREATION (Left: Arm Upper)     Patient location during evaluation: PACU Anesthesia Type: Regional Level of consciousness: awake Pain management: pain level controlled Vital Signs Assessment: post-procedure vital signs reviewed and stable Respiratory status: spontaneous breathing, nonlabored ventilation and respiratory function stable Cardiovascular status: blood pressure returned to baseline and stable Postop Assessment: no apparent nausea or vomiting Anesthetic complications: no   No notable events documented.  Last Vitals:  Vitals:   01/03/24 0658 01/03/24 0707  BP: 119/62 (!) 110/57  Pulse: 74 76  Resp: 12 13  Temp:  36.8 C  SpO2: 100% 100%    Last Pain:  Vitals:   01/03/24 0707  TempSrc: Oral  PainSc: 0-No pain                 Dayshon Roback P Sumedh Shinsato

## 2024-01-03 NOTE — Progress Notes (Signed)
 Called to check on the status of this patients HD treatment. HD nurse informed this nurse that he is getting ready to send for the patient.  Feliberto Gottron, LPN

## 2024-01-03 NOTE — Progress Notes (Signed)
 Patient ID: Philip Benjamin, male   DOB: 11-18-1950, 73 y.o.   MRN: [id number]  Seen on morning rounds.  He is sitting up in a wheelchair, dozing.  He awakens easily and is appropriate.  No major issues overnight.  He did well with dialysis via TDC.  Left arm stage brachiobasilic fistula incision appears healthy.  There is a thrill palpable in the arm above the incision.  He should follow-up with [place] in 6 weeks with a duplex of the fistula to evaluate for possible transposition.  Please call for any questions.  Rande Brunt. Lenell Antu, MD [facility] Phone Number: [phone] 01/03/2024 10:30 AM

## 2024-01-03 NOTE — Progress Notes (Signed)
 HD nurse notified that patient is ready for treatment.   Feliberto Gottron, LPN

## 2024-01-03 NOTE — Progress Notes (Signed)
 PROGRESS NOTE   Subjective/Complaints: Low CBG of 27 when he got back from dialysis, Lantus was reduced yesterday he was also n.p.o. yesterday for fistula placement Asking if he has UTI , discussed that he is receiving abxbut sensitivity is pending   ROS:  Pt denies SOB, abd pain, CP, and vision changes, acute pain  +malaise, +depression- situational, +diarrhea, +fatigue, +generalized weakness   Objective:   VAS [place] UPPER EXT VEIN MAPPING (PRE-OP AVF) Result Date: 01/01/2024 UPPER EXTREMITY VEIN MAPPING Patient Name:  Philip Benjamin  Date of Exam:   01/01/2024 Medical Rec #: [id number]         Accession #:    [id number] Date of Birth: 03/17/1951        Patient Gender: M Patient Age:   73 years Exam Location:  [facility] Procedure:      VAS [place] UPPER EXT VEIN MAPPING (PRE-OP AVF) Referring Phys: Emilie Rutter --------------------------------------------------------------------------------  Indications: Pre-access. Performing Technologist: Dondra Prader RVT  Examination Guidelines: A complete evaluation includes B-mode imaging, spectral Doppler, color Doppler, and power Doppler as needed of all accessible portions of each vessel. Bilateral testing is considered an integral part of a complete examination. Limited examinations for reoccurring indications may be performed as noted. +-----------------+-------------+----------+--------------+ Right Cephalic   Diameter (cm)Depth (cm)   Findings    +-----------------+-------------+----------+--------------+ Shoulder                                not visualized +-----------------+-------------+----------+--------------+ Prox upper arm                          not visualized +-----------------+-------------+----------+--------------+ Mid upper arm                           not visualized +-----------------+-------------+----------+--------------+ Dist upper arm                           not visualized +-----------------+-------------+----------+--------------+ Antecubital fossa                       not visualized +-----------------+-------------+----------+--------------+ Prox forearm         0.27       19.00                  +-----------------+-------------+----------+--------------+ Mid forearm          0.20        0.30     branching    +-----------------+-------------+----------+--------------+ Dist forearm                            not visualized +-----------------+-------------+----------+--------------+ Wrist                                   not visualized +-----------------+-------------+----------+--------------+ +--------------+-------------+----------+--------------+ Right Basilic Diameter (cm)Depth (cm)   Findings    +--------------+-------------+----------+--------------+ Shoulder  not visualized +--------------+-------------+----------+--------------+ Prox upper arm    0.25        1.00                  +--------------+-------------+----------+--------------+ Mid upper arm     0.27        0.80                  +--------------+-------------+----------+--------------+ Dist upper arm    0.35        0.60     branching    +--------------+-------------+----------+--------------+ Prox forearm                         not visualized +--------------+-------------+----------+--------------+ Mid forearm                          not visualized +--------------+-------------+----------+--------------+ Distal forearm                       not visualized +--------------+-------------+----------+--------------+ Elbow                                not visualized +--------------+-------------+----------+--------------+ Wrist                                not visualized +--------------+-------------+----------+--------------+ +-----------------+-------------+----------+--------------+  Left Cephalic    Diameter (cm)Depth (cm)   Findings    +-----------------+-------------+----------+--------------+ Shoulder             0.20        0.70                  +-----------------+-------------+----------+--------------+ Mid upper arm        0.16        0.20                  +-----------------+-------------+----------+--------------+ Dist upper arm       0.07                 branching    +-----------------+-------------+----------+--------------+ Antecubital fossa    0.34        0.40                  +-----------------+-------------+----------+--------------+ Prox forearm         0.20        0.40                  +-----------------+-------------+----------+--------------+ Mid forearm          0.19        0.40                  +-----------------+-------------+----------+--------------+ Dist forearm         0.18        0.18                  +-----------------+-------------+----------+--------------+ Wrist                                   not visualized +-----------------+-------------+----------+--------------+ +--------------+-------------+----------+---------+ Left Basilic  Diameter (cm)Depth (cm)Findings  +--------------+-------------+----------+---------+ Prox upper arm    0.33        1.40   branching +--------------+-------------+----------+---------+ Mid upper arm     0.33  0.44   branching +--------------+-------------+----------+---------+ Dist upper arm    0.34        0.08   branching +--------------+-------------+----------+---------+ Prox forearm      0.24        0.50   branching +--------------+-------------+----------+---------+ Mid forearm       0.10        0.24   branching +--------------+-------------+----------+---------+ *See table(s) above for measurements and observations.  Diagnosing physician: Sherald Hess MD Electronically signed by Sherald Hess MD on 01/01/2024 at 10:07:56 PM.    Final     Recent Labs    01/01/24 0628 01/02/24 0902 01/02/24 1420  WBC 15.6* 10.3  --   HGB 10.0* 9.6* 10.5*  HCT 30.4* 29.3* 31.0*  PLT 198 190  --     Recent Labs    12/31/23 1110 01/02/24 1224 01/02/24 1420  NA 131* 133* 131*  K 4.0 4.2 4.3  CL 99 98 98  CO2 24 22  --   GLUCOSE 111* 58* 84  BUN 50* 40* 37*  CREATININE 2.81* 2.68* 3.00*  CALCIUM 9.0 8.7*  --      Intake/Output Summary (Last 24 hours) at 01/03/2024 0918 Last data filed at 01/03/2024 0707 Gross per 24 hour  Intake 920 ml  Output 1525 ml  Net -605 ml         Physical Exam: Vital Signs Blood pressure (!) 117/54, pulse 81, temperature 98 F (36.7 C), resp. rate 17, height 6\' 1"  (1.854 m), weight 85.3 kg, SpO2 100%.  General: No acute distress Mood and affect are appropriate Heart: Regular rate and rhythm no rubs murmurs or extra sounds Lungs: Clear to auscultation, breathing unlabored, no rales or wheezes Abdomen: Positive bowel sounds, soft nontender to palpation, nondistended Extremities: No clubbing, cyanosis, or edema  Neuro - unchanged     Comments: RUE- biceps 4+/5, Triceps 4+/5; WE 5-/5; Grip 4/5; FA 4-/5 LUE- Biceps, triceps 5-/5; WE 5-/5; Grip 4+/5; FA 4/5 RLE- HF 4-/5; otherwise 4/5 rest of RLE LLE- 4+/5 throughout Skin:    General: Skin is warm and dry.     Comments: R chest HD catheter-  Neurological:     Mental Status: He is oriented to person, place, and time.     Comments: Patient is alert.  Answers basic questions.  Follows simple commands MAS of 2 in RLE- few beats clonus B/L LE's and MAS of 1+ in LLE (+) hoffman's Left only  3+ DTRs B/L patella, stable 3/14 Psychiatric:        Mood and Affect: Mood normal.        Behavior: Behavior normal.     Assessment/Plan: 1. Functional deficits which require 3+ hours per day of interdisciplinary therapy in a comprehensive inpatient rehab setting. Physiatrist is providing close team supervision and 24 hour management of active  medical problems listed below. Physiatrist and rehab team continue to assess barriers to discharge/monitor patient progress toward functional and medical goals  Care Tool:  Bathing    Body parts bathed by patient: Right arm, Chest, Abdomen, Front perineal area, Right upper leg, Left upper leg, Face   Body parts bathed by helper: Buttocks, Left arm, Left lower leg, Right lower leg Body parts n/a: Left arm, Buttocks, Left lower leg, Right lower leg   Bathing assist Assist Level: Moderate Assistance - Patient 50 - 74%     Upper Body Dressing/Undressing Upper body dressing   What is the patient wearing?: Pull over shirt    Upper body assist Assist  Level: Minimal Assistance - Patient > 75%    Lower Body Dressing/Undressing Lower body dressing      What is the patient wearing?: Pants, Underwear/pull up     Lower body assist Assist for lower body dressing: Moderate Assistance - Patient 50 - 74%     Toileting Toileting    Toileting assist Assist for toileting: Moderate Assistance - Patient 50 - 74%     Transfers Chair/bed transfer  Transfers assist     Chair/bed transfer assist level: Minimal Assistance - Patient > 75%     Locomotion Ambulation   Ambulation assist      Assist level: Moderate Assistance - Patient 50 - 74% Assistive device: Walker-rolling Max distance: 36ft   Walk 10 feet activity   Assist     Assist level: Moderate Assistance - Patient - 50 - 74% Assistive device: Walker-rolling   Walk 50 feet activity   Assist Walk 50 feet with 2 turns activity did not occur: Safety/medical concerns         Walk 150 feet activity   Assist Walk 150 feet activity did not occur: Safety/medical concerns         Walk 10 feet on uneven surface  activity   Assist Walk 10 feet on uneven surfaces activity did not occur: Safety/medical concerns         Wheelchair     Assist Is the patient using a wheelchair?: Yes Type of Wheelchair:  Manual    Wheelchair assist level: Supervision/Verbal cueing Max wheelchair distance: 150'    Wheelchair 50 feet with 2 turns activity    Assist        Assist Level: Supervision/Verbal cueing   Wheelchair 150 feet activity     Assist      Assist Level: Supervision/Verbal cueing   Blood pressure (!) 117/54, pulse 81, temperature 98 F (36.7 C), resp. rate 17, height 6\' 1"  (1.854 m), weight 85.3 kg, SpO2 100%.  Medical Problem List and Plan: 1. 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  Antithrombotics: -DVT/anticoagulation:  Pharmaceutical: Heparin             -antiplatelet therapy: Aspirin 81 mg daily 3. Pain Management: Tylenol as needed  3/9- no pain 4. Situational depression:  Neuropsych consulted. Offered chaplain consult but patient defers. Prozac 20 mg daily, Xanax 0.25 mg 3 times daily as needed  Affect god this am , engaged              -antipsychotic agents: N/A 5. Neuropsych/cognition: This patient is capable of making decisions on his own behalf. 6. Skin/Wound Care: Routine skin checks 7. Fluids/Electrolytes/Nutrition: Routine in and outs with follow-up chemistries 8.  End-stage renal disease.  Hemodialysis per renal services, continue  9.  Type 2 diabetes mellitus, hemoglobin A1c 5.6. Decrease glargine to 20U. D/c feeding supplement, would rec taking peanut butter crackers to snack on in HD    10.  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  Discussed with Dr. Wynetta Emery neurosurgery placed on trial of steroids.  No current intervention at this time.  Needs to f/u with NSU after d/c.   11.  Anemia of chronic disease.  Follow-up CBC  12.  Glaucoma.  Legally blind  left eye.  Continue eyedrops  13.  BPH. Continue Flomax 0.4 mg daily  14.  Hyperlipidemia.  Continue Lipitor  15.  Hypertension.   Monitor with increased mobility. Reviewed and well controlled Vitals:   01/03/24 0707 01/03/24 0856  BP: (!) 110/57 (!) 117/54  Pulse: 76 81  Resp: 13 17  Temp: 98.2 F (36.8 C) 98 F (36.7 C)  SpO2: 100% 100%     16.  Recent cholecystitis.  Status post cholecystostomy tube placement drainage of abscess 12/16.  Follow-up outpatient  17.  Acute diarrhea.    continue imodium, d/c senna  18. 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 Bradycardia: medications reviewed and he is not on any heart rate lowering agents  resolved  20. Poor intake: recommended eating fruits since these are filled with fiber and will bulk his stool  21. Leukocytosis: resolved, continue keflex  22. Decreased urinary output: UA/UC ordered, discussed UA is positive, keflex started, f/u UC, greater than 100,000 Klebsiella pneumoniae, await sensitivities  23. Generalized weakness: asked nursing to check CBG since NPO for fistula placement, CBG is 56, decreased glargine to 27U  24. Decreased urination: discussed this could be 2/2 UTI or HD, continue keflex    LOS: 11 days A FACE TO FACE EVALUATION WAS PERFORMED  Erick Colace 01/03/2024, 9:18 AM

## 2024-01-03 NOTE — Consult Note (Signed)
 Neuropsychological Consultation Comprehensive Inpatient Rehab   Patient:   Philip Benjamin   DOB:   07-25-1951  MR Number:  [id number]  Location:  [facility] [address] [place] [address] Dept: [phone] Loc: [phone]           Date of Service:   01/02/2024  Start Time:   10 AM End Time:   11 AM  Provider/Observer:  Arley Phenix, Psy.D.       Clinical Neuropsychologist       Billing Code/Service: [phone]  Reason for Service:    Philip Benjamin is a 73 year old male referred for neuropsychological consultation during his ongoing admission to the comprehensive inpatient rehabilitation unit.  The patient has a past medical history including type 2 diabetes, anemia, BPH, hypertension, hyperlipidemia, end-stage renal disease with hemodialysis, glaucoma/legal blindness.  Patient presented to [facility] on 12/18/2023 with reports of right-sided weakness over the prior 48 hours.  Patient had a recent admission in December secondary to general malaise, nausea and weakness.  Patient had been discharged at that point to a skilled nursing facility.  His illness at that point was assumed to be related to positive COVID-19 with dehydration and hypokalemia.  Patient's recent admission on October 2024 was due to sepsis secondary to UTI and lobar pneumonia.  Patient also had short time in skilled nursing facility before returning home.  With patient's present admission on 12/18/2023 he was admitted due to right-sided weakness with MRI of brain showing a 3 mm acute infarct within the left lentiform nucleus.  There was noted atrophy and advanced chronic small vessel ischemic disease and chronic infarcts.  Also noted were several nonspecific chronic microhemorrhages scattered within the supratentorial and infratentorial brain regions.  There was no large vessel occlusion noted.  Severe spinal canal stenosis and cord  compression was noted at C3-4.  Patient was admitted to CIR due to decreased functional mobility and right-sided weakness.  During today's clinical visit, the patient describes symptoms consistent with the type of motor deficits typically seen with lentiform involvement and motor deficits for right side consistent with left-sided stroke.  Patient was oriented to being in the hospital and some of his many medical issues.  Patient stated emphatically that he wanted to improve is much as he could so he could avoid being discharged to skilled nursing if at all possible.  Patient notes that he does have family that can help but he has to have certain capacity around his ADLs.  Patient admitted to adjustment difficulties and worry.  HPI for the current admission:    HPI: Philip Benjamin is a 73 year old right-handed male with history significant for type 2 diabetes mellitus, anemia of chronic disease, BPH, hypertension, hyperlipidemia, end-stage renal disease with hemodialysis Monday Wednesday and Friday, glaucoma of the left eye/legally blind. Per chart review patient lives with spouse and children. Two-level home bed and bath main level and 5 steps to entry. Household ambulator with rolling walker. Daughter and brother can provide assistance on discharge. Presented to [facility] 12/18/2023 with right side weakness x 48 hours as well as diarrhea. Patient with recent admission 09/29/2023 - 10/10/2023 secondary to general malaise, nausea and weakness with nonproductive cough and was discharged to skilled nursing facility ambulating min mod assist 30 feet. At that time workup demonstrated positive COVID-19 with mild dehydration and hypokalemia. Patient also with recent admission 08/21/2023 - 08/25/2023 treated for sepsis secondary to  Pseudomonas UTI and lobar pneumonia. He was treated with IV cefepime and discharged with Cipro. During patient's latest admission noted acute cholecystitis concern for abscess CT  of abdomen showed distended gallbladder with pericholecystic stranding underwent cholecystostomy tube placement with drainage of abscess 12/16 completing course of Zosyn and was discharged for short time to skilled nursing facility before returning home. Upon present admission of 12/18/2023 due to right sided weakness MRI of the brain showed a 3 mm acute infarct within the left lentiform nucleus. Background parenchymal atrophy, advanced chronic small vessel ischemic disease and chronic infarcts. Several nonspecific chronic microhemorrhages scattered within the supratentorial and infratentorial brain. Carotid ultrasound without significant plaque formation. MRA showed no evidence of intracranial large vessel occlusion or stenosis. 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 No findings to suggest acute disc herniation. Multilevel foraminal stenosis greatest and severe on the left at C4-5 and C5-6. Echocardiogram with ejection fraction of 60 to 65%. The left ventricle showing no regional wall motion abnormalities. Admission chemistries SARS coronavirus negative, BUN 20, creatinine 3.02, hemoglobin 10, hemoglobin A1c 5.6. Neurology follow-up maintained on low-dose aspirin. Subcutaneous heparin initiated for DVT prophylaxis. Renal service follow-up with hemodialysis as directed. Dr. Wynetta Emery of neurosurgery consulted in regards to findings of severe spinal canal stenosis cord compression C3-4 placed on trial of oral steroids and close follow-up with possible need for surgical intervention. Patient is tolerating a regular consistency diet. Therapy evaluations completed due to patient decreased functional mobility and right-sided weakness was admitted for a comprehensive rehab program.   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   Acute stroke due to occlusion of left cerebellar artery (HCC) 12/23/2023   Acute stroke due to ischemia (HCC) 12/18/2023   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   Generalized weakness 10/02/2023   COVID-19 09/29/2023   Pseudomonas sepsis (HCC) 08/23/2023   HCAP (healthcare-associated pneumonia) 08/22/2023   Thrombocytopenia (HCC) 08/22/2023   Transaminitis 08/22/2023   Folate deficiency 08/22/2023   Glaucoma 08/22/2023   Sepsis due to undetermined organism (HCC) 08/22/2023   Urinary tract infection with hematuria 08/22/2023   Sepsis due to urinary tract infection (HCC) 08/21/2023   Malnutrition of moderate degree 08/18/2023   ESRD (end stage renal disease) on dialysis (HCC) 08/14/2023   Essential hypertension 08/14/2023   T2DM (type 2 diabetes mellitus) (HCC) 08/14/2023   Mixed hyperlipidemia 08/14/2023   BPH (benign prostatic hyperplasia) 08/14/2023    Behavioral Observation/Mental Status:   Philip Benjamin  presents as a 73 y.o.-year-old Right handed African American Male who appeared his stated age. his dress was Appropriate and he was Well Groomed and his manners were Appropriate to the situation.  his participation was indicative of Appropriate and Redirectable behaviors.  There were physical disabilities noted.  he displayed an appropriate level of cooperation and motivation.    Interactions:    Active Appropriate and Redirectable  Attention:   abnormal and attention span appeared shorter than expected for age  Memory:   within normal limits; recent and remote memory intact  Visuo-spatial:  not examined  Speech (Volume):  normal  Speech:   normal; normal  Thought Process:  Coherent and Relevant  Coherent  Though Content:  WNL; not suicidal and not  homicidal  Orientation:   person, place, and situation  Judgment:   Fair  Planning:   Fair  Affect:    Anxious  Mood:    Anxious  Insight:   Fair  Intelligence:   normal  Family Med/Psych History: History reviewed. No pertinent family history.   Impression/DX:   Philip Benjamin is a 73 year old male referred for neuropsychological consultation during his ongoing admission to the comprehensive inpatient rehabilitation unit.  The patient has a past medical history including type 2 diabetes, anemia, BPH, hypertension, hyperlipidemia, end-stage renal disease with hemodialysis, glaucoma/legal blindness.  Patient presented to [facility] on 12/18/2023 with reports of right-sided weakness over the prior 48 hours.  Patient had a recent admission in December secondary to general malaise, nausea and weakness.  Patient had been discharged at that point to a skilled nursing facility.  His illness at that point was assumed to be related to positive COVID-19 with dehydration and hypokalemia.  Patient's recent admission on October 2024 was due to sepsis secondary to UTI and lobar pneumonia.  Patient also had short time in skilled nursing facility before returning home.  With patient's present admission on 12/18/2023 he was admitted due to right-sided weakness with MRI of brain showing a 3 mm acute infarct within the left lentiform nucleus.  There was noted atrophy and advanced chronic small vessel ischemic disease and chronic infarcts.  Also noted were several nonspecific chronic microhemorrhages scattered within the supratentorial and infratentorial brain regions.  There was no large vessel occlusion noted.  Severe spinal canal stenosis and cord compression was noted at C3-4.  Patient was admitted to CIR due to decreased functional mobility and right-sided weakness.  During today's clinical visit, the patient describes symptoms consistent with the type of motor deficits typically seen with lentiform  involvement and motor deficits for right side consistent with left-sided stroke.  Patient was oriented to being in the hospital and some of his many medical issues.  Patient stated emphatically that he wanted to improve is much as he could so he could avoid being discharged to skilled nursing if at all possible.  Patient notes that he does have family that can help but he has to have certain capacity around his ADLs.  Patient admitted to adjustment difficulties and worry.  Disposition/Plan:  Today we worked on coping and adjustment issues with extended hospital stay and multiple medical issues and recent skilled nursing facility admissions ultimately discharged home before returning to the hospital over the past 5 months.          Electronically Signed   _______________________ Arley Phenix, Psy.D. Clinical Neuropsychologist

## 2024-01-03 NOTE — Progress Notes (Signed)
 Hypoglycemic Event  CBG: 27  Treatment: MD ordered for patient to eat breakfast instead of giving Glucose as ordered in hypoglycemic protocol.   Symptoms: None  Follow-up CBG: Time: 9:31 am   CBG Result: 80  Possible Reasons for Event: Other: The patient was in dialysis and returned during shift change on 01/03/2024, and he did not have breakfast. Vitals and CBG was taken. Breakfast was delivered. MD notified.   Comments/MD notified: Dr. Rodman Pickle advised to take CGB after patient finished breakfast.     Philip Benjamin

## 2024-01-03 NOTE — Progress Notes (Signed)
 Philip Benjamin Progress Note   Subjective: Patient tired this morning.  Received dialysis overnight.  Otherwise no complaints  Objective Vitals:   01/03/24 0558 01/03/24 0628 01/03/24 0658 01/03/24 0707  BP: (!) 94/57 109/64 119/62 (!) 110/57  Pulse: 68 79 74 76  Resp: 13 11 12 13   Temp:    98.2 F (36.8 C)  TempSrc:    Oral  SpO2: 100% 100% 100% 100%  Weight:      Height:       Physical Exam General: Elderly man, NAD. Room air Heart: normal rate Lungs: Bilateral chest rise with no increased work of breathing Extremities: no edema, warm and well-perfused Dialysis Access: [facility]  Additional Objective Labs: Basic Metabolic Panel: Recent Labs  Lab 12/29/23 1430 12/31/23 1110 01/02/24 1224 01/02/24 1420  NA 127* 131* 133* 131*  K 4.3 4.0 4.2 4.3  CL 95* 99 98 98  CO2 21* 24 22  --   GLUCOSE 230* 111* 58* 84  BUN 78* 50* 40* 37*  CREATININE 3.25* 2.81* 2.68* 3.00*  CALCIUM 8.5* 9.0 8.7*  --   PHOS 3.9 3.0 3.1  --    Liver Function Tests: Recent Labs  Lab 12/29/23 1430 12/31/23 1110 01/02/24 1224  ALBUMIN 2.8* 3.0* 2.8*   CBC: Recent Labs  Lab 12/29/23 1430 12/31/23 1110 01/01/24 0628 01/02/24 0902 01/02/24 1420  WBC 9.1 14.9* 15.6* 10.3  --   NEUTROABS  --   --  12.3* 7.9*  --   HGB 9.1* 9.9* 10.0* 9.6* 10.5*  HCT 27.5* 29.7* 30.4* 29.3* 31.0*  MCV 92.6 92.2 91.6 93.3  --   PLT 177 199 198 190  --    Blood Culture    Component Value Date/Time   SDES URINE, CLEAN CATCH 01/01/2024 1022   SPECREQUEST  01/01/2024 1022    NONE Performed at [facility], [address], [place], [place] [address]    CULT >=100,000 COLONIES/mL KLEBSIELLA PNEUMONIAE (A) 01/01/2024 1022   REPTSTATUS PENDING 01/01/2024 1022   Medications:    amLODipine  5 mg Oral Daily   aspirin  81 mg Oral Daily   atorvastatin  40 mg Oral Daily   cephALEXin  500 mg Oral Q12H   Chlorhexidine Gluconate Cloth  6 each Topical Q12H   cholecalciferol  1,000 Units Oral  Daily   cycloSPORINE  1 drop Both Eyes BID   darbepoetin (ARANESP) injection - DIALYSIS  60 mcg Subcutaneous Q Thu-1800   dorzolamide  1 drop Both Eyes BID   And   timolol  1 drop Both Eyes BID   fentaNYL (SUBLIMAZE) injection  50 mcg Intravenous Once   FLUoxetine  20 mg Oral Daily   heparin  5,000 Units Subcutaneous Q8H   heparin sodium (porcine)  3,800 Units Intracatheter Once   insulin glargine  27 Units Subcutaneous Daily   latanoprost  1 drop Left Eye QHS   multivitamin  1 tablet Oral QHS   tamsulosin  0.4 mg Oral Q supper   tiZANidine  2 mg Oral QHS    Dialysis Orders MWF - [facility] 4hr, 350/500, EDW 86.5kg, 2K/2.5Ca, TDC, heparin 1000U/hr - Venofer 50mg  IV q Monday - Mircera IV q 4 weeks (last 2/17)   Assessment/Plan: Hx acute L lentiform nucleus CVA: Now in CIR getting PT/OT.  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 Hx cholecystitis 09/2023: S/p IR chole tube, now removed. ESRD: Continue HD on MWF schedule. AVG/F creation  with [facility] on 3/14.  First stage. HTN/volume: BP stable. UF as tolerated. 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 Nutrition: Alb low, continue protein supplements. Diarrhea: per primary, seems improved T2DM Dispo: Pending d/c date 3/20.   01/03/2024, 8:28 AM  [facility]

## 2024-01-03 NOTE — Progress Notes (Signed)
 Physical Therapy Note  Patient Details  Name: Philip Benjamin MRN: [id number] Date of Birth: Nov 01, 1950 Today's Date: 01/03/2024    Pt unable to participate w/ therapy this AM.  Pt returned from HD this AM and increased fatigue.  Pt in BR when PT arrived and remained 2/2 constipation.  Missed time 45'.   Lucio Edward 01/03/2024, 10:34 AM

## 2024-01-03 NOTE — Progress Notes (Signed)
 Spoke with Obas (hemodialysis nurse) about transporting this patient to dialysis tonight. This nurse informed HD nurse that patient has been NPO all day and has not received anything to eat and is expecting a tray for the cafe before his dialysis treatment. Informed HD nurse that his patient experienced Low glucose levels in PACU. HD nurse informed this nurse to give him a call once the pt had eaten. Pt is in bed, call bell in reach, bed alarm on.  Feliberto Gottron, LPN

## 2024-01-04 ENCOUNTER — Encounter (HOSPITAL_COMMUNITY): Payer: Self-pay | Admitting: Surgery

## 2024-01-04 LAB — GLUCOSE, CAPILLARY
Glucose-Capillary: 123 mg/dL — ABNORMAL HIGH (ref 70–99)
Glucose-Capillary: 166 mg/dL — ABNORMAL HIGH (ref 70–99)
Glucose-Capillary: 148 mg/dL — ABNORMAL HIGH (ref 70–99)
Glucose-Capillary: 235 mg/dL — ABNORMAL HIGH (ref 70–99)

## 2024-01-04 MED ORDER — OXYBUTYNIN CHLORIDE 5 MG PO TABS
2.5000 mg | ORAL_TABLET | Freq: Three times a day (TID) | ORAL | Status: DC | PRN
  Administered 2024-01-04 – 2024-01-07 (×7): 2.5 mg via ORAL
  Filled 2024-01-04 (×8): qty 1

## 2024-01-04 NOTE — Progress Notes (Signed)
 [facility] Progress Note   Subjective: Patient still having some symptoms related to his UTI.  No other complaints.  Objective Vitals:   01/03/24 1415 01/03/24 1945 01/04/24 0526 01/04/24 0811  BP: 122/66 (!) 151/70 115/63 (!) 104/59  Pulse: 76 81 72 75  Resp: 17 18 18    Temp: 98 F (36.7 C) 98.4 F (36.9 C) 98.1 F (36.7 C)   TempSrc: Oral     SpO2: 100% 100% 98%   Weight:      Height:       Physical Exam General: Elderly man, NAD. Room air Heart: normal rate Lungs: Bilateral chest rise with no increased work of breathing Extremities: no edema, warm and well-perfused Dialysis Access: [facility]  Additional Objective Labs: Basic Metabolic Panel: Recent Labs  Lab 12/29/23 1430 12/31/23 1110 01/02/24 1224 01/02/24 1420  NA 127* 131* 133* 131*  K 4.3 4.0 4.2 4.3  CL 95* 99 98 98  CO2 21* 24 22  --   GLUCOSE 230* 111* 58* 84  BUN 78* 50* 40* 37*  CREATININE 3.25* 2.81* 2.68* 3.00*  CALCIUM 8.5* 9.0 8.7*  --   PHOS 3.9 3.0 3.1  --    Liver Function Tests: Recent Labs  Lab 12/29/23 1430 12/31/23 1110 01/02/24 1224  ALBUMIN 2.8* 3.0* 2.8*   CBC: Recent Labs  Lab 12/29/23 1430 12/31/23 1110 01/01/24 0628 01/02/24 0902 01/02/24 1420 01/03/24 1051  WBC 9.1 14.9* 15.6* 10.3  --  10.6*  NEUTROABS  --   --  12.3* 7.9*  --  8.3*  HGB 9.1* 9.9* 10.0* 9.6* 10.5* 10.3*  HCT 27.5* 29.7* 30.4* 29.3* 31.0* 31.1*  MCV 92.6 92.2 91.6 93.3  --  92.6  PLT 177 199 198 190  --  193   Blood Culture    Component Value Date/Time   SDES URINE, CLEAN CATCH 01/01/2024 1022   SPECREQUEST  01/01/2024 1022    NONE Performed at [facility], [address], [place], [place] [address]    CULT >=100,000 COLONIES/mL KLEBSIELLA PNEUMONIAE (A) 01/01/2024 1022   REPTSTATUS 01/03/2024 FINAL 01/01/2024 1022   Medications:    amLODipine  5 mg Oral Daily   aspirin  81 mg Oral Daily   atorvastatin  40 mg Oral Daily   cephALEXin  500 mg Oral Q12H    Chlorhexidine Gluconate Cloth  6 each Topical Q12H   cholecalciferol  1,000 Units Oral Daily   cycloSPORINE  1 drop Both Eyes BID   darbepoetin (ARANESP) injection - DIALYSIS  60 mcg Subcutaneous Q Thu-1800   dextrose  2 Tube Oral STAT   dorzolamide  1 drop Both Eyes BID   And   timolol  1 drop Both Eyes BID   fentaNYL (SUBLIMAZE) injection  50 mcg Intravenous Once   FLUoxetine  20 mg Oral Daily   heparin  5,000 Units Subcutaneous Q8H   insulin glargine  20 Units Subcutaneous Daily   latanoprost  1 drop Left Eye QHS   multivitamin  1 tablet Oral QHS   tamsulosin  0.4 mg Oral Q supper   tiZANidine  2 mg Oral QHS    Dialysis Orders MWF - [facility] 4hr, 350/500, EDW 86.5kg, 2K/2.5Ca, TDC, heparin 1000U/hr - Venofer 50mg  IV q Monday - Mircera IV q 4 weeks (last 2/17)   Assessment/Plan: Hx acute L lentiform nucleus CVA: Now in CIR getting PT/OT.  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 Hx cholecystitis 09/2023:  S/p IR chole tube, now removed. ESRD: Continue HD on MWF schedule. AVG/F creation with [facility] on 3/14.  First stage. HTN/volume: BP stable. UF as tolerated. 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 Nutrition: Alb low, continue protein supplements. UTI: mgmt per primary Diarrhea: improved T2DM Dispo: Pending d/c date 3/20.   01/04/2024, 8:53 AM  [facility]

## 2024-01-04 NOTE — Progress Notes (Signed)
 Occupational Therapy Session Note  Patient Details  Name: Philip Benjamin MRN: [id number] Date of Birth: 07-27-1951  Today's Date: 01/04/2024 OT Individual Time: 1005-1101 OT Individual Time Calculation (min): 56 min    Short Term Goals: Week 2:  OT Short Term Goal 1 (Week 2): Patient will complete stand step toilet transfer with close supervision OT Short Term Goal 2 (Week 2): Pt will pull pants up/down over hips with CGA usign LRAD OT Short Term Goal 3 (Week 2): Pt will donn pull over head shirt with supervision consistently OT Short Term Goal 4 (Week 2): Pt tolerate standing >2 minutes during BADLs or functional activity using LRAD CGA  Skilled Therapeutic Interventions/Progress Updates:  Pt greeted sitting in [facility], experiencing urgency, skilled OT session with focus on functional transfers and toileting.   Pt dependently transported into bathroom, stand-pivot from WC<>BSC over toilet with light Min A, dependent for pulling pants down due to urgency. Increased time allotted for toileting, minimal void of urine. At sink-side, patient completes sponge-bathing with Min A for anterior periarea thoroughness and buttock in standing. Pt utilizes figure-4 technique to reach BLE. OT assists with foot care and application of anti-fungal cream. Pt able to thread BLE into sweat-pants with unilateral support for sitting balance. Intermediately throughout session, pt requires increased time to attempt urine void in brief (per patient preference). Pt requires Max A for standing hike, unable to manage with unilateral support on RW. Pt requires Min A for UB garment management, bathing, and application of deodorant due to painful fistula site. Pt does present with min self-limiting behaviors, stating "I can't do that. . ." On multiple occasions before attempting task. Pt remained sitting in WC, posey pad alarmed, needs within reach. Pt continues to be appropriate for skilled OT intervention to promote further  functional independence in ADLs/IADLs.   Therapy Documentation Precautions:  Precautions Precautions: Fall Precaution/Restrictions Comments: R hemi Restrictions Weight Bearing Restrictions Per Provider Order: No Other Position/Activity Restrictions: r hemiparesis   Therapy/Group: Individual Therapy  Lou Cal, OTR/L, MSOT  01/04/2024, 6:19 AM

## 2024-01-04 NOTE — Progress Notes (Signed)
 PROGRESS NOTE   Subjective/Complaints:  No issues overnite  except still has bladder spasms. Discussed urine cult result Kleb p S to Keflex   ROS:  Pt denies SOB, abd pain, CP, and vision changes, acute pain  +malaise, +depression- situational, +diarrhea, +fatigue, +generalized weakness   Objective:   No results found.  Recent Labs    01/02/24 0902 01/02/24 1420 01/03/24 1051  WBC 10.3  --  10.6*  HGB 9.6* 10.5* 10.3*  HCT 29.3* 31.0* 31.1*  PLT 190  --  193    Recent Labs    01/02/24 1224 01/02/24 1420  NA 133* 131*  K 4.2 4.3  CL 98 98  CO2 22  --   GLUCOSE 58* 84  BUN 40* 37*  CREATININE 2.68* 3.00*  CALCIUM 8.7*  --      Intake/Output Summary (Last 24 hours) at 01/04/2024 0842 Last data filed at 01/04/2024 1610 Gross per 24 hour  Intake 708 ml  Output --  Net 708 ml         Physical Exam: Vital Signs Blood pressure (!) 104/59, pulse 75, temperature 98.1 F (36.7 C), resp. rate 18, height 6\' 1"  (1.854 m), weight 85.3 kg, SpO2 98%.  General: No acute distress Mood and affect are appropriate Heart: Regular rate and rhythm no rubs murmurs or extra sounds Lungs: Clear to auscultation, breathing unlabored, no rales or wheezes Abdomen: Positive bowel sounds, soft nontender to palpation, nondistended Extremities: No clubbing, cyanosis, or edema  Neuro - unchanged     Comments: RUE- biceps 4+/5, Triceps 4+/5; WE 5-/5; Grip 4/5; FA 4-/5 LUE- Biceps, triceps 5-/5; WE 5-/5; Grip 4+/5; FA 4/5 RLE- HF 4-/5; otherwise 4/5 rest of RLE LLE- 4+/5 throughout Skin:    General: Skin is warm and dry.     Comments: R chest HD catheter-  Neurological:     Mental Status: He is oriented to person, place, and time.     Comments: Patient is alert.  Answers basic questions.  Follows simple commands MAS of 2 in RLE- few beats clonus B/L LE's and MAS of 1+ in LLE (+) hoffman's Left only  3+ DTRs B/L patella,  stable 3/14 Psychiatric:        Mood and Affect: Mood normal.        Behavior: Behavior normal.     Assessment/Plan: 1. Functional deficits which require 3+ hours per day of interdisciplinary therapy in a comprehensive inpatient rehab setting. Physiatrist is providing close team supervision and 24 hour management of active medical problems listed below. Physiatrist and rehab team continue to assess barriers to discharge/monitor patient progress toward functional and medical goals  Care Tool:  Bathing    Body parts bathed by patient: Right arm, Chest, Abdomen, Front perineal area, Right upper leg, Left upper leg, Face   Body parts bathed by helper: Buttocks, Left arm, Left lower leg, Right lower leg Body parts n/a: Left arm, Buttocks, Left lower leg, Right lower leg   Bathing assist Assist Level: Moderate Assistance - Patient 50 - 74%     Upper Body Dressing/Undressing Upper body dressing   What is the patient wearing?: Pull over shirt    Upper  body assist Assist Level: Minimal Assistance - Patient > 75%    Lower Body Dressing/Undressing Lower body dressing      What is the patient wearing?: Pants, Underwear/pull up     Lower body assist Assist for lower body dressing: Moderate Assistance - Patient 50 - 74%     Toileting Toileting    Toileting assist Assist for toileting: Moderate Assistance - Patient 50 - 74%     Transfers Chair/bed transfer  Transfers assist     Chair/bed transfer assist level: Minimal Assistance - Patient > 75%     Locomotion Ambulation   Ambulation assist      Assist level: Moderate Assistance - Patient 50 - 74% Assistive device: Walker-rolling Max distance: 25ft   Walk 10 feet activity   Assist     Assist level: Moderate Assistance - Patient - 50 - 74% Assistive device: Walker-rolling   Walk 50 feet activity   Assist Walk 50 feet with 2 turns activity did not occur: Safety/medical concerns         Walk 150 feet  activity   Assist Walk 150 feet activity did not occur: Safety/medical concerns         Walk 10 feet on uneven surface  activity   Assist Walk 10 feet on uneven surfaces activity did not occur: Safety/medical concerns         Wheelchair     Assist Is the patient using a wheelchair?: Yes Type of Wheelchair: Manual    Wheelchair assist level: Supervision/Verbal cueing Max wheelchair distance: 150'    Wheelchair 50 feet with 2 turns activity    Assist        Assist Level: Supervision/Verbal cueing   Wheelchair 150 feet activity     Assist      Assist Level: Supervision/Verbal cueing   Blood pressure (!) 104/59, pulse 75, temperature 98.1 F (36.7 C), resp. rate 18, height 6\' 1"  (1.854 m), weight 85.3 kg, SpO2 98%.  Medical Problem List and Plan: 1. 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  Antithrombotics: -DVT/anticoagulation:  Pharmaceutical: Heparin             -antiplatelet therapy: Aspirin 81 mg daily 3. Pain Management: Tylenol as needed  3/9- no pain 4. Situational depression:  Neuropsych consulted. Offered chaplain consult but patient defers. Prozac 20 mg daily, Xanax 0.25 mg 3 times daily as needed               -antipsychotic agents: N/A 5. Neuropsych/cognition: This patient is capable of making decisions on his own behalf. 6. Skin/Wound Care: Routine skin checks 7. Fluids/Electrolytes/Nutrition: Routine in and outs with follow-up chemistries 8.  End-stage renal disease.  Hemodialysis per renal services, continue  9.  Type 2 diabetes mellitus, hemoglobin A1c 5.6. Decrease glargine to 20U. D/c feeding supplement, would rec taking peanut butter crackers to snack on in HD   CBG (last 3)  Recent Labs    01/03/24 1637 01/03/24 2057 01/04/24 0636  GLUCAP 120* 139* 123*  Controlled on  lower dose lantus   10.  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  Discussed with Dr. Wynetta Emery neurosurgery placed on trial of steroids.  No current intervention at this time.  Needs to f/u with NSU after d/c.   11.  Anemia of chronic disease.  Follow-up CBC  12.  Glaucoma.  Legally blind left eye.  Continue eyedrops  13.  BPH. Continue Flomax 0.4 mg daily  14.  Hyperlipidemia.  Continue Lipitor  15.  Hypertension.  Monitor with increased mobility. Reviewed and well controlled Vitals:   01/04/24 0526 01/04/24 0811  BP: 115/63 (!) 104/59  Pulse: 72 75  Resp: 18   Temp: 98.1 F (36.7 C)   SpO2: 98%      16.  Recent cholecystitis.  Status post cholecystostomy tube placement drainage of abscess 12/16.  Follow-up outpatient  17.  Acute diarrhea.    continue imodium, d/c senna  18. 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 Bradycardia: medications reviewed and he is not on any heart rate lowering agents  resolved  20. Poor intake: recommended eating fruits since these are filled with fiber and will bulk his stool  21. Leukocytosis: resolved, continue keflex  22. Decreased urinary output: UA/UC ordered, discussed UA is positive, keflex started, f/u UC, greater than 100,000 Klebsiella pneumoniae,S to keflex, still symptomatic after 3 d abx, suspect bladder spasms will add antispasmodic low dose monitor for retention  23. Generalized weakness:CVA + SCI + debility  24. Decreased urination: discussed this could be 2/2 UTI or HD, continue keflex    LOS: 12 days A FACE TO FACE EVALUATION WAS PERFORMED  Erick Colace 01/04/2024, 8:42 AM

## 2024-01-05 LAB — RENAL FUNCTION PANEL
Albumin: 2.9 g/dL — ABNORMAL LOW (ref 3.5–5.0)
Anion gap: 13 (ref 5–15)
BUN: 44 mg/dL — ABNORMAL HIGH (ref 8–23)
CO2: 21 mmol/L — ABNORMAL LOW (ref 22–32)
Calcium: 8.9 mg/dL (ref 8.9–10.3)
Chloride: 95 mmol/L — ABNORMAL LOW (ref 98–111)
Creatinine, Ser: 3.59 mg/dL — ABNORMAL HIGH (ref 0.61–1.24)
GFR, Estimated: 17 mL/min — ABNORMAL LOW (ref >60)
Glucose, Bld: 152 mg/dL — ABNORMAL HIGH (ref 70–99)
Phosphorus: 4.3 mg/dL (ref 2.5–4.6)
Potassium: 5 mmol/L (ref 3.5–5.1)
Sodium: 129 mmol/L — ABNORMAL LOW (ref 135–145)

## 2024-01-05 LAB — CBC
HCT: 28.7 % — ABNORMAL LOW (ref 39.0–52.0)
Hemoglobin: 9.3 g/dL — ABNORMAL LOW (ref 13.0–17.0)
MCH: 30.5 pg (ref 26.0–34.0)
MCHC: 32.4 g/dL (ref 30.0–36.0)
MCV: 94.1 fL (ref 80.0–100.0)
Platelets: 193 10*3/uL (ref 150–400)
RBC: 3.05 MIL/uL — ABNORMAL LOW (ref 4.22–5.81)
RDW: 12.1 % (ref 11.5–15.5)
WBC: 8.3 10*3/uL (ref 4.0–10.5)
nRBC: 0 % (ref 0.0–0.2)

## 2024-01-05 LAB — GLUCOSE, CAPILLARY
Glucose-Capillary: 141 mg/dL — ABNORMAL HIGH (ref 70–99)
Glucose-Capillary: 144 mg/dL — ABNORMAL HIGH (ref 70–99)
Glucose-Capillary: 122 mg/dL — ABNORMAL HIGH (ref 70–99)
Glucose-Capillary: 183 mg/dL — ABNORMAL HIGH (ref 70–99)

## 2024-01-05 MED ORDER — HEPARIN SODIUM (PORCINE) 1000 UNIT/ML IJ SOLN
INTRAMUSCULAR | Status: AC
Start: 2024-01-05 — End: 2024-01-05
  Administered 2024-01-05: 2000 [IU]
  Filled 2024-01-05: qty 2

## 2024-01-05 MED ORDER — VITAMIN D 25 MCG (1000 UNIT) PO TABS
2000.0000 [IU] | ORAL_TABLET | Freq: Every day | ORAL | Status: DC
  Administered 2024-01-06: 2000 [IU] via ORAL
  Filled 2024-01-05: qty 2

## 2024-01-05 MED ORDER — PHENAZOPYRIDINE HCL 100 MG PO TABS: 100.0000 mg | ORAL_TABLET | Freq: Three times a day (TID) | ORAL | Status: DC

## 2024-01-05 MED ORDER — INSULIN GLARGINE 100 UNIT/ML ~~LOC~~ SOLN
20.0000 [IU] | Freq: Every day | SUBCUTANEOUS | Status: DC
  Administered 2024-01-06 – 2024-01-07 (×2): 20 [IU] via SUBCUTANEOUS
  Filled 2024-01-05 (×3): qty 0.2

## 2024-01-05 MED ORDER — HEPARIN SODIUM (PORCINE) 1000 UNIT/ML IJ SOLN
3800.0000 [IU] | Freq: Once | INTRAMUSCULAR | Status: AC
Start: 2024-01-05 — End: 2024-01-05
  Administered 2024-01-05: 3800 [IU]
  Filled 2024-01-05: qty 4

## 2024-01-05 MED ORDER — INSULIN GLARGINE 100 UNIT/ML ~~LOC~~ SOLN
21.0000 [IU] | Freq: Every day | SUBCUTANEOUS | Status: DC
  Administered 2024-01-05: 21 [IU] via SUBCUTANEOUS
  Filled 2024-01-05 (×2): qty 0.21

## 2024-01-05 NOTE — Progress Notes (Signed)
 Occupational Therapy Session Note  Patient Details  Name: Philip Benjamin MRN: [id number] Date of Birth: [birth year]  Today's Date: 01/05/2024 OT Individual Time: 1010-1115 OT Individual Time Calculation (min): 65 min    Short Term Goals: Week 2:  OT Short Term Goal 1 (Week 2): Patient will complete stand step toilet transfer with close supervision OT Short Term Goal 2 (Week 2): Pt will pull pants up/down over hips with CGA usign LRAD OT Short Term Goal 3 (Week 2): Pt will donn pull over head shirt with supervision consistently OT Short Term Goal 4 (Week 2): Pt tolerate standing >2 minutes during BADLs or functional activity using LRAD CGA  Skilled Therapeutic Interventions/Progress Updates:    Patient received seated in wheelchair.  Patient indicating need to void, transported to bathroom and transferred to toilet with min assist and use of grab bar,  mod assist for clothing management.  Patient needs increased time, and able to void very small amount.  Patient transferred back to wheelchair with min assist and cueing for steps of task.  Set patient up at sink to bathe and dress.  Patient with edema in hands, forearms - limiting movement and grip.  Patient now has stick deodorant and able to apply under right arm - difficulty applying under left arm due to limited ability to lift left arm.  Patient needs cueing and encouragement to attempt to complete many tasks, stating his hands do not work well.  Patient is hesitant to use adaptive equipment.  Patient able to don pants over feet, but is dependent for donning compression hose and R AFO and shoe.   Noted small red area on third toe right foot and made nurse aware.  Patient left up in wheelchair with chair pad alarm in place and engaged and call bell in reach.    Therapy Documentation Precautions:  Precautions Precautions: Fall Precaution/Restrictions Comments: R hemi Restrictions Weight Bearing Restrictions Per Provider Order: No Other  Position/Activity Restrictions: r hemiparesis   Pain:  Denies pain initially, then later reported "bladder spasms" from UTI      Therapy/Group: Individual Therapy  Collier Salina 01/05/2024, 11:25 AM

## 2024-01-05 NOTE — Progress Notes (Signed)
 Received report from [facility] nurse ,30 minutes post initiation of treatment: Awake alert and oriented x 4. Consent verified.  Access used : Right HD catheter that worked well. Dressing change done today post treatment.  Duration of treatment: 3.5 hours.  Net uf goal: 2 L.  Hand off to the patient's nurse.Back into his room with stable medical condition via trans[portet.

## 2024-01-05 NOTE — Progress Notes (Addendum)
 [facility] Progress Note   Subjective:    Seen and examined patient in his room. Patient seen sitting up in his wheelchair. Denies any acute issues. Plan for HD this afternoon.  Objective Vitals:   01/05/24 0416 01/05/24 1348 01/05/24 1400 01/05/24 1430  BP: (!) 108/57 (!) 109/58 (!) 109/57 (!) 103/59  Pulse: 65 66 64 68  Resp: 16 12 10 11   Temp: 98.3 F (36.8 C) 97.9 F (36.6 C)    TempSrc:      SpO2: 100% 99% 99% 99%  Weight:  89.6 kg    Height:       Physical Exam General: Elderly man, NAD. Room air Heart: normal rate Lungs: Bilateral chest rise with no increased work of breathing Extremities: no edema, warm and well-perfused Dialysis Access: [facility]  Filed Weights   12/31/23 1446 01/02/24 1350 01/05/24 1348  Weight: 85.6 kg 85.3 kg 89.6 kg    Intake/Output Summary (Last 24 hours) at 01/05/2024 1520 Last data filed at 01/05/2024 1326 Gross per 24 hour  Intake 474 ml  Output --  Net 474 ml    Additional Objective Labs: Basic Metabolic Panel: Recent Labs  Lab 12/31/23 1110 01/02/24 1224 01/02/24 1420 01/05/24 1109  NA 131* 133* 131* 129*  K 4.0 4.2 4.3 5.0  CL 99 98 98 95*  CO2 24 22  --  21*  GLUCOSE 111* 58* 84 152*  BUN 50* 40* 37* 44*  CREATININE 2.81* 2.68* 3.00* 3.59*  CALCIUM 9.0 8.7*  --  8.9  PHOS 3.0 3.1  --  4.3   Liver Function Tests: Recent Labs  Lab 12/31/23 1110 01/02/24 1224 01/05/24 1109  ALBUMIN 3.0* 2.8* 2.9*   No results for input(s): "LIPASE", "AMYLASE" in the last 168 hours. CBC: Recent Labs  Lab 12/31/23 1110 01/01/24 0628 01/02/24 0902 01/02/24 1420 01/03/24 1051 01/05/24 1109  WBC 14.9* 15.6* 10.3  --  10.6* 8.3  NEUTROABS  --  12.3* 7.9*  --  8.3*  --   HGB 9.9* 10.0* 9.6* 10.5* 10.3* 9.3*  HCT 29.7* 30.4* 29.3* 31.0* 31.1* 28.7*  MCV 92.2 91.6 93.3  --  92.6 94.1  PLT 199 198 190  --  193 193   Blood Culture    Component Value Date/Time   SDES URINE, CLEAN CATCH 01/01/2024 1022    SPECREQUEST  01/01/2024 1022    NONE Performed at [facility], [address], [place], [place] [address]    CULT >=100,000 COLONIES/mL KLEBSIELLA PNEUMONIAE (A) 01/01/2024 1022   REPTSTATUS 01/03/2024 FINAL 01/01/2024 1022    Cardiac Enzymes: No results for input(s): "CKTOTAL", "CKMB", "CKMBINDEX", "TROPONINI" in the last 168 hours. CBG: Recent Labs  Lab 01/04/24 1120 01/04/24 1630 01/04/24 2112 01/05/24 0625 01/05/24 1117  GLUCAP 166* 148* 235* 141* 144*   Iron Studies: No results for input(s): "IRON", "TIBC", "TRANSFERRIN", "FERRITIN" in the last 72 hours. Lab Results  Component Value Date   INR 1.2 10/05/2023   Studies/Results: No results found.  Medications:   amLODipine  5 mg Oral Daily   aspirin  81 mg Oral Daily   atorvastatin  40 mg Oral Daily   cephALEXin  500 mg Oral Q12H   Chlorhexidine Gluconate Cloth  6 each Topical Q12H   [START ON 01/06/2024] cholecalciferol  2,000 Units Oral Daily   cycloSPORINE  1 drop Both Eyes BID   darbepoetin (ARANESP) injection - DIALYSIS  60 mcg Subcutaneous Q Thu-1800   dorzolamide  1 drop Both Eyes BID  And   timolol  1 drop Both Eyes BID   fentaNYL (SUBLIMAZE) injection  50 mcg Intravenous Once   FLUoxetine  20 mg Oral Daily   heparin  5,000 Units Subcutaneous Q8H   heparin sodium (porcine)  3,800 Units Intracatheter Once   insulin glargine  21 Units Subcutaneous Daily   latanoprost  1 drop Left Eye QHS   multivitamin  1 tablet Oral QHS   tamsulosin  0.4 mg Oral Q supper   tiZANidine  2 mg Oral QHS    Dialysis Orders: MWF - [facility] 4hr, 350/500, EDW 86.5kg, 2K/2.5Ca, TDC, heparin 1000U/hr - Venofer 50mg  IV q Monday - Mircera IV q 4 weeks (last 2/17)  Assessment/Plan: Hx acute L lentiform nucleus CVA: Now in CIR getting PT/OT. Noted neuro-psych was consulted. 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 Hx cholecystitis 09/2023: S/p IR chole  tube, now removed. ESRD: Continue HD on MWF schedule. AVG/F creation with [facility] on 3/14.  First stage. HTN/volume: BP stable. UF as tolerated. 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 Nutrition: Alb low, continue protein supplements. Diarrhea: per primary, seems improved T2DM Dispo: Pending d/c date 3/20.  Salome Holmes, NP [facility] 01/05/2024,3:20 PM  LOS: 13 days

## 2024-01-05 NOTE — Progress Notes (Signed)
 Physical Therapy Session Note  Patient Details  Name: Philip Benjamin MRN: [id number] Date of Birth: Nov 26, 1950  Today's Date: 01/05/2024 PT Individual Time: 1117-1157 PT Individual Time Calculation (min): 40 min   Short Term Goals: Week 1:  PT Short Term Goal 1 (Week 1): Pt will complete bed mobility with minA without hospial bed features PT Short Term Goal 1 - Progress (Week 1): Not met PT Short Term Goal 2 (Week 1): Pt will complete bed<>chair transfers with CGA and LRAD PT Short Term Goal 2 - Progress (Week 1): Not met PT Short Term Goal 3 (Week 1): Pt will ambulate 86ft with minA and LRAD PT Short Term Goal 3 - Progress (Week 1): Not met PT Short Term Goal 4 (Week 1): Pt will initiate stair training PT Short Term Goal 4 - Progress (Week 1): Not met PT Short Term Goal 5 (Week 1): Pt will improve BERG balance score by at least 8 points  Skilled Therapeutic Interventions/Progress Updates:     Pt seated in WC upon arrival. Pt agreeable to therapy. Pt denies any pain, but endorses irritation with UTI and increased urge/frequency of urination.   Pt performed sit to stand 3x5 throughout session with mod progressing to min A, verbal cues provided for correction of posterior bias, and R lateral trunk lean.   Pt ambulated 25 feet with RW and min A and +2 for WC follow for safety, with therapist advancing RW, and providing verbal cues for increased step length/Width. Pt required seated rest break for fatigue. Attempted 2nd gait trail however pt self discontinued 2/2 fatigue.   Pt seated in [facility] with all needs within reach and seatbelt alarm on and family in the room.       Therapy Documentation Precautions:  Precautions Precautions: Fall Precaution/Restrictions Comments: R hemi Restrictions Weight Bearing Restrictions Per Provider Order: No Other Position/Activity Restrictions: r hemiparesis   Therapy/Group: Individual Therapy  [facility], [place],  DPT   01/05/2024, 11:22 AM

## 2024-01-05 NOTE — Progress Notes (Signed)
 Occupational Therapy Session Note  Patient Details  Name: Philip Benjamin MRN: [id number] Date of Birth: [birth year]  Today's Date: 01/05/2024 OT Individual Time: 3664-4034 OT Individual Time Calculation (min): 40 min    Short Term Goals: Week 2:  OT Short Term Goal 1 (Week 2): Patient will complete stand step toilet transfer with close supervision OT Short Term Goal 2 (Week 2): Pt will pull pants up/down over hips with CGA usign LRAD OT Short Term Goal 3 (Week 2): Pt will donn pull over head shirt with supervision consistently OT Short Term Goal 4 (Week 2): Pt tolerate standing >2 minutes during BADLs or functional activity using LRAD CGA  Skilled Therapeutic Interventions/Progress Updates:  Skilled OT intervention completed with focus on RUE/BUE NMR. Pt received seated in w/c, agreeable to session. Soreness reported in LUE at fistula placement; pt declined pain meds stating "it's only a little sore." OT offered rest breaks and repositioning throughout for pain reduction.  Transported pt dependently in w/c <> gym. Seated at table top, pt completed the following RUE/BUE NMR activities to promote improved FM coordination and dexterity: -placing/retrieving clips from tower. Utilized LUE instinctively as it is stronger, however able to use RUE with cues but did demo compensatory strategies including picking up clup with LUE and transferring to RUE, and gross grasping clip vs pinching clip. Increased time needed due to difficulty -placing/retrieving rings from cones; able to match colors without difficulty in well lit area  Back in room pt remained seated in w/c, with chair alarm on/activated, and with all needs in reach at end of session.   Therapy Documentation Precautions:  Precautions Precautions: Fall Precaution/Restrictions Comments: R hemi Restrictions Weight Bearing Restrictions Per Provider Order: No Other Position/Activity Restrictions: r hemiparesis    Therapy/Group:  Individual Therapy  Melvyn Novas, MS, OTR/L  01/05/2024, 12:24 PM

## 2024-01-05 NOTE — Plan of Care (Signed)
  Problem: RH PAIN MANAGEMENT Goal: RH STG PAIN MANAGED AT OR BELOW PT'S PAIN GOAL Description: < 4 with prns Outcome: Progressing

## 2024-01-05 NOTE — Progress Notes (Signed)
 PROGRESS NOTE   Subjective/Complaints: Ate 100% of breakfast Discussed CBGs up to 234 yesterday, will increase insulin to 21U Continues to have bladder cramps  ROS:  Pt denies SOB, abd pain, CP, and vision changes, acute pain  +malaise, +depression- situational, +diarrhea, +fatigue, +generalized weakness, +bladder cramps   Objective:   No results found.  Recent Labs    01/02/24 1420 01/03/24 1051  WBC  --  10.6*  HGB 10.5* 10.3*  HCT 31.0* 31.1*  PLT  --  193    Recent Labs    01/02/24 1224 01/02/24 1420  NA 133* 131*  K 4.2 4.3  CL 98 98  CO2 22  --   GLUCOSE 58* 84  BUN 40* 37*  CREATININE 2.68* 3.00*  CALCIUM 8.7*  --      Intake/Output Summary (Last 24 hours) at 01/05/2024 1107 Last data filed at 01/04/2024 1800 Gross per 24 hour  Intake 236 ml  Output --  Net 236 ml         Physical Exam: Vital Signs Blood pressure (!) 108/57, pulse 65, temperature 98.3 F (36.8 C), resp. rate 16, height 6\' 1"  (1.854 m), weight 85.3 kg, SpO2 100%.  General: No acute distress Mood and affect are appropriate Heart: Regular rate and rhythm no rubs murmurs or extra sounds Lungs: Clear to auscultation, breathing unlabored, no rales or wheezes Abdomen: Positive bowel sounds, soft nontender to palpation, nondistended Extremities: No clubbing, cyanosis, or edema  Neuro - unchanged     Comments: RUE- biceps 4+/5, Triceps 4+/5; WE 5-/5; Grip 4/5; FA 4-/5 LUE- Biceps, triceps 5-/5; WE 5-/5; Grip 4+/5; FA 4/5 RLE- HF 4-/5; otherwise 4/5 rest of RLE LLE- 4+/5 throughout Skin:    General: Skin is warm and dry.     Comments: R chest HD catheter-  Neurological:     Mental Status: He is oriented to person, place, and time.     Comments: Patient is alert.  Answers basic questions.  Follows simple commands MAS of 2 in RLE- few beats clonus B/L LE's and MAS of 1+ in LLE (+) hoffman's Left only  3+ DTRs B/L patella,  stable 3/17 Psychiatric:        Mood and Affect: Mood normal.        Behavior: Behavior normal.     Assessment/Plan: 1. Functional deficits which require 3+ hours per day of interdisciplinary therapy in a comprehensive inpatient rehab setting. Physiatrist is providing close team supervision and 24 hour management of active medical problems listed below. Physiatrist and rehab team continue to assess barriers to discharge/monitor patient progress toward functional and medical goals  Care Tool:  Bathing    Body parts bathed by patient: Right arm, Chest, Abdomen, Front perineal area, Right upper leg, Left upper leg, Face   Body parts bathed by helper: Buttocks, Left arm, Left lower leg, Right lower leg Body parts n/a: Left arm, Buttocks, Left lower leg, Right lower leg   Bathing assist Assist Level: Moderate Assistance - Patient 50 - 74%     Upper Body Dressing/Undressing Upper body dressing   What is the patient wearing?: Pull over shirt    Upper body assist Assist Level:  Minimal Assistance - Patient > 75%    Lower Body Dressing/Undressing Lower body dressing      What is the patient wearing?: Pants, Underwear/pull up     Lower body assist Assist for lower body dressing: Moderate Assistance - Patient 50 - 74%     Toileting Toileting    Toileting assist Assist for toileting: Moderate Assistance - Patient 50 - 74%     Transfers Chair/bed transfer  Transfers assist     Chair/bed transfer assist level: Minimal Assistance - Patient > 75%     Locomotion Ambulation   Ambulation assist      Assist level: Moderate Assistance - Patient 50 - 74% Assistive device: Walker-rolling Max distance: 43ft   Walk 10 feet activity   Assist     Assist level: Moderate Assistance - Patient - 50 - 74% Assistive device: Walker-rolling   Walk 50 feet activity   Assist Walk 50 feet with 2 turns activity did not occur: Safety/medical concerns         Walk 150 feet  activity   Assist Walk 150 feet activity did not occur: Safety/medical concerns         Walk 10 feet on uneven surface  activity   Assist Walk 10 feet on uneven surfaces activity did not occur: Safety/medical concerns         Wheelchair     Assist Is the patient using a wheelchair?: Yes Type of Wheelchair: Manual    Wheelchair assist level: Supervision/Verbal cueing Max wheelchair distance: 150'    Wheelchair 50 feet with 2 turns activity    Assist        Assist Level: Supervision/Verbal cueing   Wheelchair 150 feet activity     Assist      Assist Level: Supervision/Verbal cueing   Blood pressure (!) 108/57, pulse 65, temperature 98.3 F (36.8 C), resp. rate 16, height 6\' 1"  (1.854 m), weight 85.3 kg, SpO2 100%.  Medical Problem List and Plan: 1. 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  Antithrombotics: -DVT/anticoagulation:  Pharmaceutical: Heparin             -antiplatelet therapy: Aspirin 81 mg daily 3. Pain Management: Tylenol as needed  3/9- no pain 4. Situational depression:  Neuropsych consulted. Offered chaplain consult but patient defers. Prozac 20 mg daily, Xanax 0.25 mg 3 times daily as needed               -antipsychotic agents: N/A 5. Neuropsych/cognition: This patient is capable of making decisions on his own behalf. 6. Skin/Wound Care: Routine skin checks 7. Fluids/Electrolytes/Nutrition: Routine in and outs with follow-up chemistries 8.  End-stage renal disease.  Hemodialysis per renal services, continue  9.  Type 2 diabetes mellitus, hemoglobin A1c 5.6. Increase lantus to 21U.  D/c feeding supplement  CBG (last 3)  Recent Labs    01/04/24 1630 01/04/24 2112 01/05/24 0625  GLUCAP 148* 235* 141*    10.  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  Discussed  with Dr. Wynetta Emery neurosurgery placed on trial of steroids.  No current intervention at this time.  Needs to f/u with NSU after d/c.   11.  Anemia of chronic disease.  Follow-up CBC  12.  Glaucoma.  Legally blind left eye.  Continue eyedrops  13.  BPH. Continue Flomax 0.4 mg daily  14.  Hyperlipidemia.  Continue Lipitor  15.  Hypertension.  Monitor with increased mobility. Reviewed and well controlled Vitals:   01/04/24 1939 01/05/24 0416  BP: (!) 154/81 (!) 108/57  Pulse: 83 65  Resp: 16 16  Temp: 98.3 F (36.8 C) 98.3 F (36.8 C)  SpO2: 100% 100%     16.  Recent cholecystitis.  Status post cholecystostomy tube placement drainage of abscess 12/16.  Follow-up outpatient  17.  Acute diarrhea.    continue imodium, d/c senna  18. 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 Bradycardia: medications reviewed and he is not on any heart rate lowering agents  resolved  20. Poor intake: recommended eating fruits since these are filled with fiber and will bulk his stool  21. Leukocytosis: resolved, continue keflex  22. Decreased urinary output: UA/UC ordered, discussed UA is positive, keflex started, f/u UC, greater than 100,000 Klebsiella pneumoniae,S to keflex, still symptomatic after 3 d abx, suspect bladder spasms will add antispasmodic low dose monitor for retention  23. Generalized weakness:CVA + SCI + debility, increase D3 to 2,000U  24. Decreased urination: discussed this could be 2/2 UTI or HD, continue keflex, stop date added  25. Bladder cramps, discussed pyridium but contraindicated based on patient's medical conditions    LOS: 13 days A FACE TO FACE EVALUATION WAS PERFORMED  Philip Benjamin P Shivani Barrantes 01/05/2024, 11:07 AM

## 2024-01-05 NOTE — Progress Notes (Signed)
 Physical Therapy Session Note  Patient Details  Name: Philip Benjamin MRN: [id number] Date of Birth: [birth year]  Today's Date: 01/05/2024 PT Individual Time: 0802-0856 PT Individual Time Calculation (min): 54 min   Short Term Goals: Week 2:  PT Short Term Goal 1 (Week 2): STG = LTG due to ELOS  Skilled Therapeutic Interventions/Progress Updates:      Pt resting in bed on arrival - ate 100% of his breakfast. Denies any pains but continues to report difficulty with voiding and continues to feel symptoms from his UTI.   Bed mobility completed with supervision with hospital bed features. Once sitting EOB, pt reports urge to void - urinal provided 2/2 urgency. Pt unable to void despite urge.   Donned clean briefs and pants with maxA as he sat EOB - sit<>stand to RW from raised EOB with minA with cues for widening BOS and reducing forward flexed trunk. Pt unable to let go of RW while standing without losing his balance.   Completed squat pivot transfer to wheelchair with minA with cues for setup and sequencing.  Donned slip-on shoes and R AFO with totalA for time.  Transported to day room rehab gym at w/c level for energy conservation.  Sit<>stand to RW with minA from w/c height - continues to require verbal and visual cues for widening BOS prior to standing. Gait training up to 33ft with heavy minA and RW - gait distance limited by RLE muscle fatigue. Primary gait deficits including forward trunk lean, heavy reliance of BUE support through RW, decreased step height/length on RLE, and narrow BOS. Required +2 assist for providing wheelchair at end of 21ft to allow safe sitting to rest.   Pt instructed in standing toe taps to 3" block with RW support - 2x10 on LLE and 2x5 on RLE. MinA for stability and balance during task.   Returned to his room and patient left sitting upright in wheelchair with his needs and call bell in reach.   Therapy Documentation Precautions:   Precautions Precautions: Fall Precaution/Restrictions Comments: R hemi Restrictions Weight Bearing Restrictions Per Provider Order: No Other Position/Activity Restrictions: r hemiparesis General:     Therapy/Group: Individual Therapy  [name] 01/05/2024, 7:47 AM

## 2024-01-06 ENCOUNTER — Encounter

## 2024-01-06 ENCOUNTER — Encounter: Admitting: Vascular Surgery

## 2024-01-06 ENCOUNTER — Other Ambulatory Visit

## 2024-01-06 ENCOUNTER — Other Ambulatory Visit: Payer: Medicare Other

## 2024-01-06 ENCOUNTER — Encounter: Payer: Medicare Other | Admitting: Vascular Surgery

## 2024-01-06 LAB — CBC WITH DIFFERENTIAL/PLATELET
Abs Immature Granulocytes: 0.04 10*3/uL (ref 0.00–0.07)
Basophils Absolute: 0 10*3/uL (ref 0.0–0.1)
Basophils Relative: 1 %
Eosinophils Absolute: 0.2 10*3/uL (ref 0.0–0.5)
Eosinophils Relative: 3 %
HCT: 25.9 % — ABNORMAL LOW (ref 39.0–52.0)
Hemoglobin: 8.5 g/dL — ABNORMAL LOW (ref 13.0–17.0)
Immature Granulocytes: 1 %
Lymphocytes Relative: 15 %
Lymphs Abs: 1.1 10*3/uL (ref 0.7–4.0)
MCH: 30.2 pg (ref 26.0–34.0)
MCHC: 32.8 g/dL (ref 30.0–36.0)
MCV: 92.2 fL (ref 80.0–100.0)
Monocytes Absolute: 1 10*3/uL (ref 0.1–1.0)
Monocytes Relative: 14 %
Neutro Abs: 4.9 10*3/uL (ref 1.7–7.7)
Neutrophils Relative %: 66 %
Platelets: 176 10*3/uL (ref 150–400)
RBC: 2.81 MIL/uL — ABNORMAL LOW (ref 4.22–5.81)
RDW: 11.9 % (ref 11.5–15.5)
WBC: 7.3 10*3/uL (ref 4.0–10.5)
nRBC: 0 % (ref 0.0–0.2)

## 2024-01-06 LAB — GLUCOSE, CAPILLARY
Glucose-Capillary: 111 mg/dL — ABNORMAL HIGH (ref 70–99)
Glucose-Capillary: 135 mg/dL — ABNORMAL HIGH (ref 70–99)
Glucose-Capillary: 128 mg/dL — ABNORMAL HIGH (ref 70–99)
Glucose-Capillary: 172 mg/dL — ABNORMAL HIGH (ref 70–99)

## 2024-01-06 MED ORDER — VITAMIN D 25 MCG (1000 UNIT) PO TABS
3000.0000 [IU] | ORAL_TABLET | Freq: Every day | ORAL | Status: DC
  Administered 2024-01-07 – 2024-01-15 (×9): 3000 [IU] via ORAL
  Filled 2024-01-06 (×9): qty 3

## 2024-01-06 MED ORDER — CHLORHEXIDINE GLUCONATE CLOTH 2 % EX PADS
6.0000 | MEDICATED_PAD | Freq: Every day | CUTANEOUS | Status: DC
Start: 2024-01-07 — End: 2024-01-07
  Administered 2024-01-07: 6 via TOPICAL

## 2024-01-06 NOTE — Progress Notes (Signed)
 Pt called for pain medication as his bladder was spasming and causing pain of 5 out of 10. Pt was medicated with oxy and he was offered a warm compress. He refused the compress. He stated he thought he needed to urinate and he was assisted with urinal. He was not able to go in the bed with the urinal. But he did have some urine noted in his brief. I did a bladder scan and he had 687cc in bladder. He was assisted to the [facility] then to commode where he did void and his brief was very wet. He was changed and cleaned then helped back to bed. I did a PVR and he had 298cc in bladder. He reported his spasms had resolved and he would call if he felt the urge again.

## 2024-01-06 NOTE — Progress Notes (Signed)
 [facility] Progress Note   Subjective:    Seen and examined patient at bedside. He's reporting ongoing bladder spasms. Otherwise, no other complaints. Next HD 3/19.  Objective Vitals:   01/05/24 2000 01/06/24 0422 01/06/24 0806 01/06/24 1500  BP: (!) 115/58 (!) 106/53 108/60 118/72  Pulse: 77 65 67 70  Resp: 18 18  18   Temp: 98.4 F (36.9 C) 99 F (37.2 C)  98.7 F (37.1 C)  TempSrc:    Oral  SpO2: 100% 99% 100% 100%  Weight:      Height:       Physical Exam General: Elderly man, NAD. Room air Heart: normal rate Lungs: Bilateral chest rise with no increased work of breathing Extremities: no edema, warm and well-perfused Dialysis Access: [facility]  Filed Weights   01/02/24 1350 01/05/24 1348 01/05/24 1805  Weight: 85.3 kg 89.6 kg 87.5 kg    Intake/Output Summary (Last 24 hours) at 01/06/2024 1644 Last data filed at 01/06/2024 0900 Gross per 24 hour  Intake 450 ml  Output 2298 ml  Net -1848 ml    Additional Objective Labs: Basic Metabolic Panel: Recent Labs  Lab 12/31/23 1110 01/02/24 1224 01/02/24 1420 01/05/24 1109  NA 131* 133* 131* 129*  K 4.0 4.2 4.3 5.0  CL 99 98 98 95*  CO2 24 22  --  21*  GLUCOSE 111* 58* 84 152*  BUN 50* 40* 37* 44*  CREATININE 2.81* 2.68* 3.00* 3.59*  CALCIUM 9.0 8.7*  --  8.9  PHOS 3.0 3.1  --  4.3   Liver Function Tests: Recent Labs  Lab 12/31/23 1110 01/02/24 1224 01/05/24 1109  ALBUMIN 3.0* 2.8* 2.9*   No results for input(s): "LIPASE", "AMYLASE" in the last 168 hours. CBC: Recent Labs  Lab 01/01/24 0628 01/02/24 0902 01/02/24 1420 01/03/24 1051 01/05/24 1109 01/06/24 0458  WBC 15.6* 10.3  --  10.6* 8.3 7.3  NEUTROABS 12.3* 7.9*  --  8.3*  --  4.9  HGB 10.0* 9.6*   < > 10.3* 9.3* 8.5*  HCT 30.4* 29.3*   < > 31.1* 28.7* 25.9*  MCV 91.6 93.3  --  92.6 94.1 92.2  PLT 198 190  --  193 193 176   < > = values in this interval not displayed.   Blood Culture    Component Value Date/Time   SDES  URINE, CLEAN CATCH 01/01/2024 1022   SPECREQUEST  01/01/2024 1022    NONE Performed at [facility], [address], [place], [place] [address]    CULT >=100,000 COLONIES/mL KLEBSIELLA PNEUMONIAE (A) 01/01/2024 1022   REPTSTATUS 01/03/2024 FINAL 01/01/2024 1022    Cardiac Enzymes: No results for input(s): "CKTOTAL", "CKMB", "CKMBINDEX", "TROPONINI" in the last 168 hours. CBG: Recent Labs  Lab 01/05/24 1815 01/05/24 2106 01/06/24 0613 01/06/24 1127 01/06/24 1627  GLUCAP 122* 183* 111* 135* 128*   Iron Studies: No results for input(s): "IRON", "TIBC", "TRANSFERRIN", "FERRITIN" in the last 72 hours. Lab Results  Component Value Date   INR 1.2 10/05/2023   Studies/Results: No results found.  Medications:   amLODipine  5 mg Oral Daily   aspirin  81 mg Oral Daily   atorvastatin  40 mg Oral Daily   cephALEXin  500 mg Oral Q12H   Chlorhexidine Gluconate Cloth  6 each Topical Q12H   [START ON 01/07/2024] cholecalciferol  3,000 Units Oral Daily   cycloSPORINE  1 drop Both Eyes BID   darbepoetin (ARANESP) injection - DIALYSIS  60 mcg Subcutaneous  Q Thu-1800   dorzolamide  1 drop Both Eyes BID   And   timolol  1 drop Both Eyes BID   fentaNYL (SUBLIMAZE) injection  50 mcg Intravenous Once   FLUoxetine  20 mg Oral Daily   heparin  5,000 Units Subcutaneous Q8H   insulin glargine  20 Units Subcutaneous Daily   latanoprost  1 drop Left Eye QHS   multivitamin  1 tablet Oral QHS   tamsulosin  0.4 mg Oral Q supper   tiZANidine  2 mg Oral QHS    Dialysis Orders: MWF - [facility] 4hr, 350/500, EDW 86.5kg, 2K/2.5Ca, TDC, heparin 1000U/hr - Venofer 50mg  IV q Monday - Mircera IV q 4 weeks (last 2/17)  Assessment/Plan: Hx acute L lentiform nucleus CVA: Now in CIR getting PT/OT. Noted neuro-psych was consulted. 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 Hx cholecystitis 09/2023: S/p IR chole tube, now removed. ESRD:  Continue HD on MWF schedule. AVG/F creation with [facility] on 3/14.  First stage. HTN/volume: BP stable. UF as tolerated. 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 Nutrition: Alb low, continue protein supplements. Diarrhea: per primary, seems improved T2DM Dispo: Pending d/c date 3/20.  Salome Holmes, NP  [facility] 01/06/2024,4:44 PM  LOS: 14 days

## 2024-01-06 NOTE — Progress Notes (Signed)
 Physical Therapy Session Note  Patient Details  Name: Philip Benjamin MRN: [id number] Date of Birth: 04/09/1951  Today's Date: 01/06/2024 PT Individual Time: 4098-1191 + [id number] PT Individual Time Calculation (min): 43 min  + 70 min  Short Term Goals: Week 2:  PT Short Term Goal 1 (Week 2): STG = LTG due to ELOS  Skilled Therapeutic Interventions/Progress Updates:      1st session: Pt sitting upright in wheelchair - pt asking to have his R big toe looked at. Removed sock and assessed skin - toenails long and need trimmed but no redness or signs of sore noted. Pt reports he goes to OP podiatry for nail/foot care and reports he has an appointment scheduled for follow up.   Transported patient to day room rehab gym. Completed an ambulatory transfer with RW at [facility] level from wheelchair to mat table - cues for general stability and increasing R step length/height.   Assisted patient to supine on mat table to complete the following strengthening exercises: -1x12 SLR on L -1x6 SLR on R -1x12 heel slides on L -1x8 heel slides on R -1x12 hip abd/add on L -1x12 hip abd/add on R -1x25 ankle pumps bilaterally -long sitting active hamstring stretching 4x30 seconds -long sitting russian twists with 4## dumbbell 2x12 -long sitting front shoulder riase with 4# dumbbell 1x6 -long sitting chest press with 4# dumbbell 1x10 -long sitting isometric front shoulder hold with 4# dumbbell 5x5 seconds *rest breaks as needed b/w sets with PT cueing on setup and sequencing.  Pt returned to his room and left sitting upright. All needs met at end with call bell in reach.     2nd session: Pt sitting in wheelchair and agreeable to PT tx. No reports of pain.   Transported to day room rehab gym and completed a modA stand pivot transfer to Nustep. Assist for RLE setup on foot pedal - completed x10 minutes at L6 resistance with BUE/BLE - activity completed for neural priming to prepare for gait training.    Gait training completed using LiteGait overground with moderate BWS. Pt ambulated 5x20ft distances with +2 assist for managing LiteGait and minA for facilitating gait on his R side. After 26ft, pt demonstrates muscle fatigue in his R hip flexor and is unable to advance unless external assist provided. Pt needing prolonged extended rest breaks b/w sets to fatigue and for energy conservation.  Pt returned to his room and left sitting upright in wheelchair with his needs met - call bell within reach.    Therapy Documentation Precautions:  Precautions Precautions: Fall Precaution/Restrictions Comments: R hemi Restrictions Weight Bearing Restrictions Per Provider Order: No Other Position/Activity Restrictions: r hemiparesis General:      Therapy/Group: Individual Therapy  [name] 01/06/2024, 7:47 AM

## 2024-01-06 NOTE — Progress Notes (Signed)
 PROGRESS NOTE   Subjective/Complaints: No new compaints this morning On commode Had bladder spasms overnight and improved with oxycodone  ROS:  Pt denies SOB, abd pain, CP, and vision changes, acute pain  +malaise, +depression- situational, +diarrhea, +fatigue, +generalized weakness, +bladder cramps   Objective:   No results found.  Recent Labs    01/05/24 1109 01/06/24 0458  WBC 8.3 7.3  HGB 9.3* 8.5*  HCT 28.7* 25.9*  PLT 193 176    Recent Labs    01/05/24 1109  NA 129*  K 5.0  CL 95*  CO2 21*  GLUCOSE 152*  BUN 44*  CREATININE 3.59*  CALCIUM 8.9     Intake/Output Summary (Last 24 hours) at 01/06/2024 1019 Last data filed at 01/06/2024 0900 Gross per 24 hour  Intake 688 ml  Output 2298 ml  Net -1610 ml         Physical Exam: Vital Signs Blood pressure 108/60, pulse 67, temperature 99 F (37.2 C), resp. rate 18, height 6\' 1"  (1.854 m), weight 87.5 kg, SpO2 100%.  General: No acute distress Mood and affect are appropriate Heart: Regular rate and rhythm no rubs murmurs or extra sounds Lungs: Clear to auscultation, breathing unlabored, no rales or wheezes Abdomen: Positive bowel sounds, soft nontender to palpation, nondistended Extremities: No clubbing, cyanosis, or edema  Neuro - unchanged     Comments: RUE- biceps 4+/5, Triceps 4+/5; WE 5-/5; Grip 4/5; FA 4-/5 LUE- Biceps, triceps 5-/5; WE 5-/5; Grip 4+/5; FA 4/5 RLE- HF 4-/5; otherwise 4/5 rest of RLE LLE- 4+/5 throughout Skin:    General: Skin is warm and dry.     Comments: R chest HD catheter-  Neurological:     Mental Status: He is oriented to person, place, and time.     Comments: Patient is alert.  Answers basic questions.  Follows simple commands MAS of 2 in RLE- few beats clonus B/L LE's and MAS of 1+ in LLE (+) hoffman's Left only  3+ DTRs B/L patella, stable 3/18 Psychiatric:        Mood and Affect: Mood normal.         Behavior: Behavior normal.     Assessment/Plan: 1. Functional deficits which require 3+ hours per day of interdisciplinary therapy in a comprehensive inpatient rehab setting. Physiatrist is providing close team supervision and 24 hour management of active medical problems listed below. Physiatrist and rehab team continue to assess barriers to discharge/monitor patient progress toward functional and medical goals  Care Tool:  Bathing    Body parts bathed by patient: Right arm, Chest, Abdomen, Front perineal area, Right upper leg, Left upper leg, Face   Body parts bathed by helper: Buttocks, Left arm, Left lower leg, Right lower leg Body parts n/a: Left arm, Buttocks, Left lower leg, Right lower leg   Bathing assist Assist Level: Moderate Assistance - Patient 50 - 74%     Upper Body Dressing/Undressing Upper body dressing   What is the patient wearing?: Pull over shirt    Upper body assist Assist Level: Minimal Assistance - Patient > 75%    Lower Body Dressing/Undressing Lower body dressing      What is the  patient wearing?: Pants, Underwear/pull up     Lower body assist Assist for lower body dressing: Moderate Assistance - Patient 50 - 74%     Toileting Toileting    Toileting assist Assist for toileting: Moderate Assistance - Patient 50 - 74%     Transfers Chair/bed transfer  Transfers assist     Chair/bed transfer assist level: Minimal Assistance - Patient > 75%     Locomotion Ambulation   Ambulation assist      Assist level: Moderate Assistance - Patient 50 - 74% Assistive device: Walker-rolling Max distance: 61ft   Walk 10 feet activity   Assist     Assist level: Moderate Assistance - Patient - 50 - 74% Assistive device: Walker-rolling   Walk 50 feet activity   Assist Walk 50 feet with 2 turns activity did not occur: Safety/medical concerns         Walk 150 feet activity   Assist Walk 150 feet activity did not occur: Safety/medical  concerns         Walk 10 feet on uneven surface  activity   Assist Walk 10 feet on uneven surfaces activity did not occur: Safety/medical concerns         Wheelchair     Assist Is the patient using a wheelchair?: Yes Type of Wheelchair: Manual    Wheelchair assist level: Supervision/Verbal cueing Max wheelchair distance: 150'    Wheelchair 50 feet with 2 turns activity    Assist        Assist Level: Supervision/Verbal cueing   Wheelchair 150 feet activity     Assist      Assist Level: Supervision/Verbal cueing   Blood pressure 108/60, pulse 67, temperature 99 F (37.2 C), resp. rate 18, height 6\' 1"  (1.854 m), weight 87.5 kg, SpO2 100%.  Medical Problem List and Plan: 1. 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  Antithrombotics: -DVT/anticoagulation:  Pharmaceutical: Heparin             -antiplatelet therapy: Aspirin 81 mg daily 3. Pain Management: Tylenol as needed  3/9- no pain 4. Situational depression:  Neuropsych consulted. Offered chaplain consult but patient defers. Prozac 20 mg daily, Xanax 0.25 mg 3 times daily as needed               -antipsychotic agents: N/A 5. Neuropsych/cognition: This patient is capable of making decisions on his own behalf. 6. Skin/Wound Care: Routine skin checks 7. Fluids/Electrolytes/Nutrition: Routine in and outs with follow-up chemistries 8.  End-stage renal disease.  Hemodialysis per renal services, continue  9.  Type 2 diabetes mellitus, hemoglobin A1c 5.6. Increase lantus to 21U.  D/c feeding supplement  CBG (last 3)  Recent Labs    01/05/24 1815 01/05/24 2106 01/06/24 0613  GLUCAP 122* 183* 111*    10.  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  Discussed with Dr. Wynetta Emery neurosurgery placed on trial of steroids.  No current  intervention at this time.  Needs to f/u with NSU after d/c.   11.  Anemia of chronic disease.  Follow-up CBC  12.  Glaucoma.  Legally blind left eye.  Continue eyedrops  13.  BPH. Continue Flomax 0.4 mg daily  14.  Hyperlipidemia.  Continue Lipitor  15.  Hypertension.  Monitor with increased mobility. Reviewed and well controlled Vitals:   01/06/24 0422 01/06/24 0806  BP: (!) 106/53 108/60  Pulse: 65 67  Resp: 18   Temp: 99 F (37.2 C)   SpO2: 99% 100%     16.  Recent cholecystitis.  Status post cholecystostomy tube placement drainage of abscess 12/16.  Follow-up outpatient  17.  Acute diarrhea.    continue imodium, d/c senna  18. 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 Bradycardia: medications reviewed and he is not on any heart rate lowering agents  resolved  20. Poor intake: recommended eating fruits since these are filled with fiber and will bulk his stool  21. Leukocytosis: resolved, continue keflex  22. Decreased urinary output: UA/UC ordered, discussed UA is positive, keflex started, f/u UC, greater than 100,000 Klebsiella pneumoniae,S to keflex, still symptomatic after 3 d abx, suspect bladder spasms will add antispasmodic low dose monitor for retention  23. Generalized weakness:CVA + SCI + debility, increase D3 to 3,000U daily  24. Decreased urination: discussed this could be 2/2 UTI or HD, continue keflex, stop date added  25. Bladder cramps, discussed pyridium but contraindicated based on patient's medical conditions, nursing note reviewed and oxycodone provided relief  26. Anemia: worsening, repeat stool ocult ordered    LOS: 14 days A FACE TO FACE EVALUATION WAS PERFORMED  Clint Bolder P Taytum Wheller 01/06/2024, 10:19 AM

## 2024-01-06 NOTE — Progress Notes (Signed)
 Occupational Therapy Session Note  Patient Details  Name: Philip Benjamin MRN: [id number] Date of Birth: 1951/01/26  Today's Date: 01/06/2024 OT Individual Time: 1300-1415 OT Individual Time Calculation (min): 75 min    Short Term Goals: Week 2:  OT Short Term Goal 1 (Week 2): Patient will complete stand step toilet transfer with close supervision OT Short Term Goal 2 (Week 2): Pt will pull pants up/down over hips with CGA usign LRAD OT Short Term Goal 3 (Week 2): Pt will donn pull over head shirt with supervision consistently OT Short Term Goal 4 (Week 2): Pt tolerate standing >2 minutes during BADLs or functional activity using LRAD CGA  Skilled Therapeutic Interventions/Progress Updates:     Pt received sitting up in wc finishing his lunch presenting to be in good spirits receptive to skilled OT session reporting 0/10 pain, however fatigue and frequent urination reported 2/2 recent UTI- OT offering intermittent rest breaks, repositioning, and therapeutic support to optimize participation in therapy session. Pt requesting to stay in room for skilled OT session- focused this session on BADL retraining, functional transfers, and activity tolerance. Pt requesting to use restroom at beginning of session. Transported Pt to bathroom via wc. Stand pivot wc > BSC positioned over toilet using grab bars min A for balance and to power-up to full standing position. Pt able to partially doff pants from waist in standing with L UE supported on grab bar with A required to fully bring pants from waist. Increased time provided on toilet, however no B&B at this time. Pt stood form BSC using grab bar with min A and brought pants to waist with assistance required to adjust pants over R hip. Stand pivot BSC > wc using grab bar min A, verbal cues for safety and positioning. Positioned Pt in front of sink in w/c for bathe and dress activities. Utilized bathing mit for UB bathing to promote increased independence and  decrease challenge of grasping wash cloth with Pt then able to complete UB bathing min A with A required to wash L under arm and R UE. Pt completed majority of LB bathing in seated position crossing B LEs into figure-four position to wash lower portions of legs. Pt stood with B UEs supported on sink min A and maintained standing balance CGA while OT washed Pt's buttocks- seated rest break provided following. Pt donned clean pull over head shirt with min A to bring shirt over head. Pt able to cross B LEs into figure-four position to weave B LEs into pants +increased time and stood while holding sink min A with max A required to bring brief and pants to waist. Worked on grooming/hygiene tasks in seated position with Pt able to wash face and brush teeth with set-up assist with verbal cues required to locate and donn toothpaste d/t FMC and visual deficits. Donned knee high TEDs and non-slip socks total A for energy conservation and time management. Pt was left resting in wc with call bell in reach and all needs met.    Therapy Documentation Precautions:  Precautions Precautions: Fall Precaution/Restrictions Comments: R hemi Restrictions Weight Bearing Restrictions Per Provider Order: No Other Position/Activity Restrictions: r hemiparesis  Therapy/Group: Individual Therapy  [name] 01/06/2024, 7:55 AM

## 2024-01-07 LAB — CBC WITH DIFFERENTIAL/PLATELET
Abs Immature Granulocytes: 0.04 10*3/uL (ref 0.00–0.07)
Basophils Absolute: 0 10*3/uL (ref 0.0–0.1)
Basophils Relative: 1 %
Eosinophils Absolute: 0.3 10*3/uL (ref 0.0–0.5)
Eosinophils Relative: 4 %
HCT: 24.8 % — ABNORMAL LOW (ref 39.0–52.0)
Hemoglobin: 8.3 g/dL — ABNORMAL LOW (ref 13.0–17.0)
Immature Granulocytes: 1 %
Lymphocytes Relative: 16 %
Lymphs Abs: 1.2 10*3/uL (ref 0.7–4.0)
MCH: 31 pg (ref 26.0–34.0)
MCHC: 33.5 g/dL (ref 30.0–36.0)
MCV: 92.5 fL (ref 80.0–100.0)
Monocytes Absolute: 1.1 10*3/uL — ABNORMAL HIGH (ref 0.1–1.0)
Monocytes Relative: 14 %
Neutro Abs: 5 10*3/uL (ref 1.7–7.7)
Neutrophils Relative %: 64 %
Platelets: 175 10*3/uL (ref 150–400)
RBC: 2.68 MIL/uL — ABNORMAL LOW (ref 4.22–5.81)
RDW: 12 % (ref 11.5–15.5)
WBC: 7.6 10*3/uL (ref 4.0–10.5)
nRBC: 0 % (ref 0.0–0.2)

## 2024-01-07 LAB — RENAL FUNCTION PANEL
Albumin: 2.6 g/dL — ABNORMAL LOW (ref 3.5–5.0)
Anion gap: 9 (ref 5–15)
BUN: 36 mg/dL — ABNORMAL HIGH (ref 8–23)
CO2: 27 mmol/L (ref 22–32)
Calcium: 8.9 mg/dL (ref 8.9–10.3)
Chloride: 94 mmol/L — ABNORMAL LOW (ref 98–111)
Creatinine, Ser: 3.22 mg/dL — ABNORMAL HIGH (ref 0.61–1.24)
GFR, Estimated: 19 mL/min — ABNORMAL LOW (ref >60)
Glucose, Bld: 101 mg/dL — ABNORMAL HIGH (ref 70–99)
Phosphorus: 4 mg/dL (ref 2.5–4.6)
Potassium: 4 mmol/L (ref 3.5–5.1)
Sodium: 130 mmol/L — ABNORMAL LOW (ref 135–145)

## 2024-01-07 LAB — GLUCOSE, CAPILLARY
Glucose-Capillary: 92 mg/dL (ref 70–99)
Glucose-Capillary: 97 mg/dL (ref 70–99)
Glucose-Capillary: 116 mg/dL — ABNORMAL HIGH (ref 70–99)
Glucose-Capillary: 184 mg/dL — ABNORMAL HIGH (ref 70–99)

## 2024-01-07 MED ORDER — TIZANIDINE HCL 4 MG PO TABS
2.0000 mg | ORAL_TABLET | Freq: Once | ORAL | Status: AC
  Administered 2024-01-07: 2 mg via ORAL
  Filled 2024-01-07: qty 1

## 2024-01-07 MED ORDER — CHLORHEXIDINE GLUCONATE CLOTH 2 % EX PADS
6.0000 | MEDICATED_PAD | Freq: Two times a day (BID) | CUTANEOUS | Status: DC
Start: 2024-01-07 — End: 2024-01-08
  Administered 2024-01-07 – 2024-01-08 (×2): 6 via TOPICAL

## 2024-01-07 MED ORDER — SULFAMETHOXAZOLE-TRIMETHOPRIM 400-80 MG PO TABS
1.0000 | ORAL_TABLET | Freq: Every day | ORAL | Status: AC
  Administered 2024-01-07 – 2024-01-09 (×3): 1 via ORAL
  Filled 2024-01-07 (×3): qty 1

## 2024-01-07 MED ORDER — HEPARIN SODIUM (PORCINE) 1000 UNIT/ML IJ SOLN
INTRAMUSCULAR | Status: AC
  Filled 2024-01-07: qty 4

## 2024-01-07 MED ORDER — INSULIN GLARGINE 100 UNIT/ML ~~LOC~~ SOLN
19.0000 [IU] | Freq: Every day | SUBCUTANEOUS | Status: DC
  Administered 2024-01-08 – 2024-01-11 (×4): 19 [IU] via SUBCUTANEOUS
  Filled 2024-01-07 (×5): qty 0.19

## 2024-01-07 MED ORDER — TIZANIDINE HCL 4 MG PO TABS
2.0000 mg | ORAL_TABLET | Freq: Two times a day (BID) | ORAL | Status: DC
  Administered 2024-01-07 – 2024-01-13 (×12): 2 mg via ORAL
  Filled 2024-01-07 (×12): qty 1

## 2024-01-07 MED ORDER — SULFAMETHOXAZOLE-TRIMETHOPRIM 400-80 MG PO TABS
1.0000 | ORAL_TABLET | Freq: Once | ORAL | Status: AC
  Administered 2024-01-07: 1 via ORAL
  Filled 2024-01-07: qty 1

## 2024-01-07 MED ORDER — HEPARIN SODIUM (PORCINE) 5000 UNIT/ML IJ SOLN
5000.0000 [IU] | Freq: Two times a day (BID) | INTRAMUSCULAR | Status: DC
  Administered 2024-01-07 – 2024-01-08 (×2): 5000 [IU] via SUBCUTANEOUS
  Filled 2024-01-07 (×2): qty 1

## 2024-01-07 MED ORDER — LIDOCAINE HCL URETHRAL/MUCOSAL 2 % EX GEL
1.0000 | Freq: Once | CUTANEOUS | Status: AC
  Administered 2024-01-07: 1 via URETHRAL
  Filled 2024-01-07: qty 6

## 2024-01-07 MED ORDER — HEPARIN SODIUM (PORCINE) 1000 UNIT/ML IJ SOLN
INTRAMUSCULAR | Status: AC
  Filled 2024-01-07: qty 2

## 2024-01-07 NOTE — Progress Notes (Signed)
 [facility] Progress Note   Subjective:    Seen and examined patient at bedside. C/o ongoing bladder spasms. Discussed with bedside tech. I was informed a bladder scan was performed which showed around 800cc of urine. A in&out cath was attempted but unsuccessful and resulted in hematuria. Informed Urology was already consulted. Plan for HD this afternoon.  Objective Vitals:   01/06/24 0806 01/06/24 1500 01/06/24 2012 01/07/24 0414  BP: 108/60 118/72 122/67 104/60  Pulse: 67 70 70 61  Resp:  18 18 18   Temp:  98.7 F (37.1 C) 97.9 F (36.6 C) 98.1 F (36.7 C)  TempSrc:  Oral    SpO2: 100% 100% 100% 99%  Weight:      Height:       Physical Exam General: Elderly man, NAD. Room air Heart: normal rate Lungs: Bilateral chest rise with no increased work of breathing Extremities: no edema, warm and well-perfused Dialysis Access: [facility]  Filed Weights   01/02/24 1350 01/05/24 1348 01/05/24 1805  Weight: 85.3 kg 89.6 kg 87.5 kg    Intake/Output Summary (Last 24 hours) at 01/07/2024 1410 Last data filed at 01/07/2024 1318 Gross per 24 hour  Intake 357 ml  Output 800 ml  Net -443 ml    Additional Objective Labs: Basic Metabolic Panel: Recent Labs  Lab 01/02/24 1224 01/02/24 1420 01/05/24 1109 01/07/24 0503  NA 133* 131* 129* 130*  K 4.2 4.3 5.0 4.0  CL 98 98 95* 94*  CO2 22  --  21* 27  GLUCOSE 58* 84 152* 101*  BUN 40* 37* 44* 36*  CREATININE 2.68* 3.00* 3.59* 3.22*  CALCIUM 8.7*  --  8.9 8.9  PHOS 3.1  --  4.3 4.0   Liver Function Tests: Recent Labs  Lab 01/02/24 1224 01/05/24 1109 01/07/24 0503  ALBUMIN 2.8* 2.9* 2.6*   No results for input(s): "LIPASE", "AMYLASE" in the last 168 hours. CBC: Recent Labs  Lab 01/02/24 0902 01/02/24 1420 01/03/24 1051 01/05/24 1109 01/06/24 0458 01/07/24 0503  WBC 10.3  --  10.6* 8.3 7.3 7.6  NEUTROABS 7.9*  --  8.3*  --  4.9 5.0  HGB 9.6*   < > 10.3* 9.3* 8.5* 8.3*  HCT 29.3*   < > 31.1* 28.7* 25.9*  24.8*  MCV 93.3  --  92.6 94.1 92.2 92.5  PLT 190  --  193 193 176 175   < > = values in this interval not displayed.   Blood Culture    Component Value Date/Time   SDES URINE, CLEAN CATCH 01/01/2024 1022   SPECREQUEST  01/01/2024 1022    NONE Performed at [facility], [address] [place], [place] [address]    CULT >=100,000 COLONIES/mL KLEBSIELLA PNEUMONIAE (A) 01/01/2024 1022   REPTSTATUS 01/03/2024 FINAL 01/01/2024 1022    Cardiac Enzymes: No results for input(s): "CKTOTAL", "CKMB", "CKMBINDEX", "TROPONINI" in the last 168 hours. CBG: Recent Labs  Lab 01/06/24 1127 01/06/24 1627 01/06/24 2127 01/07/24 0616 01/07/24 1207  GLUCAP 135* 128* 172* 92 97   Iron Studies: No results for input(s): "IRON", "TIBC", "TRANSFERRIN", "FERRITIN" in the last 72 hours. Lab Results  Component Value Date   INR 1.2 10/05/2023   Studies/Results: No results found.  Medications:   amLODipine  5 mg Oral Daily   aspirin  81 mg Oral Daily   atorvastatin  40 mg Oral Daily   cephALEXin  500 mg Oral Q12H   Chlorhexidine Gluconate Cloth  6 each Topical Q0600  cholecalciferol  3,000 Units Oral Daily   cycloSPORINE  1 drop Both Eyes BID   darbepoetin (ARANESP) injection - DIALYSIS  60 mcg Subcutaneous Q Thu-1800   dorzolamide  1 drop Both Eyes BID   And   timolol  1 drop Both Eyes BID   fentaNYL (SUBLIMAZE) injection  50 mcg Intravenous Once   FLUoxetine  20 mg Oral Daily   heparin  5,000 Units Subcutaneous Q12H   heparin sodium (porcine)       [START ON 01/08/2024] insulin glargine  19 Units Subcutaneous Daily   latanoprost  1 drop Left Eye QHS   multivitamin  1 tablet Oral QHS   sulfamethoxazole-trimethoprim  1 tablet Oral Daily   tamsulosin  0.4 mg Oral Q supper   tiZANidine  2 mg Oral BID    Dialysis Orders: MWF - [facility] 4hr, 350/500, EDW 86.5kg, 2K/2.5Ca, TDC, heparin 1000U/hr - Venofer 50mg  IV q Monday - Mircera IV q 4 weeks (last  2/17)  Assessment/Plan: Hx acute L lentiform nucleus CVA: Now in CIR getting PT/OT. Noted neuro-psych was consulted. 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 Hx cholecystitis 09/2023: S/p IR chole tube, now removed. ESRD: Continue HD on MWF schedule. AVG/F creation with [facility] on 3/14.  First stage.  HTN/volume: BP stable. Euvolemic on exam. Will repeat CXR in AM.  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 Nutrition: Alb low, continue protein supplements. Diarrhea: per primary, seems improved T2DM Dispo: Pending d/c date 3/20.  Salome Holmes, NP [facility] 01/07/2024,2:10 PM  LOS: 15 days

## 2024-01-07 NOTE — Progress Notes (Addendum)
 PROGRESS NOTE   Subjective/Complaints: Consulting urology regarding patient's urinary retention as he retained 800cc urine this morning and nursing unable to catheterize him  ROS:  Pt denies SOB, abd pain, CP, and vision changes, acute pain  +malaise, +depression- situational, +diarrhea, +fatigue, +generalized weakness, +bladder cramps, +urinary retention   Objective:   No results found.  Recent Labs    01/06/24 0458 01/07/24 0503  WBC 7.3 7.6  HGB 8.5* 8.3*  HCT 25.9* 24.8*  PLT 176 175    Recent Labs    01/05/24 1109 01/07/24 0503  NA 129* 130*  K 5.0 4.0  CL 95* 94*  CO2 21* 27  GLUCOSE 152* 101*  BUN 44* 36*  CREATININE 3.59* 3.22*  CALCIUM 8.9 8.9     Intake/Output Summary (Last 24 hours) at 01/07/2024 1023 Last data filed at 01/07/2024 0908 Gross per 24 hour  Intake 477 ml  Output --  Net 477 ml         Physical Exam: Vital Signs Blood pressure 104/60, pulse 61, temperature 98.1 F (36.7 C), resp. rate 18, height 6\' 1"  (1.854 m), weight 87.5 kg, SpO2 99%.  General: No acute distress Mood and affect are appropriate Heart: Regular rate and rhythm no rubs murmurs or extra sounds Lungs: Clear to auscultation, breathing unlabored, no rales or wheezes Abdomen: Positive bowel sounds, soft nontender to palpation, nondistended Extremities: No clubbing, cyanosis, or edema  Neuro - unchanged     Comments: RUE- biceps 4+/5, Triceps 4+/5; WE 5-/5; Grip 4/5; FA 4-/5 LUE- Biceps, triceps 5-/5; WE 5-/5; Grip 4+/5; FA 4/5 RLE- HF 4-/5; otherwise 4/5 rest of RLE LLE- 4+/5 throughout Skin:    General: Skin is warm and dry.     Comments: R chest HD catheter-  Neurological:     Mental Status: He is oriented to person, place, and time.     Comments: Patient is alert.  Answers basic questions.  Follows simple commands MAS of 2 in RLE- few beats clonus B/L LE's and MAS of 1+ in LLE (+) hoffman's Left  only  3+ DTRs B/L patella, stable 3/19 Psychiatric:        Mood and Affect: Mood normal.        Behavior: Behavior normal.     Assessment/Plan: 1. Functional deficits which require 3+ hours per day of interdisciplinary therapy in a comprehensive inpatient rehab setting. Physiatrist is providing close team supervision and 24 hour management of active medical problems listed below. Physiatrist and rehab team continue to assess barriers to discharge/monitor patient progress toward functional and medical goals  Care Tool:  Bathing    Body parts bathed by patient: Right arm, Chest, Abdomen, Front perineal area, Right upper leg, Left upper leg, Face   Body parts bathed by helper: Buttocks, Left arm, Left lower leg, Right lower leg Body parts n/a: Left arm, Buttocks, Left lower leg, Right lower leg   Bathing assist Assist Level: Moderate Assistance - Patient 50 - 74%     Upper Body Dressing/Undressing Upper body dressing   What is the patient wearing?: Pull over shirt    Upper body assist Assist Level: Minimal Assistance - Patient > 75%  Lower Body Dressing/Undressing Lower body dressing      What is the patient wearing?: Pants, Underwear/pull up     Lower body assist Assist for lower body dressing: Moderate Assistance - Patient 50 - 74%     Toileting Toileting    Toileting assist Assist for toileting: Moderate Assistance - Patient 50 - 74%     Transfers Chair/bed transfer  Transfers assist     Chair/bed transfer assist level: Minimal Assistance - Patient > 75%     Locomotion Ambulation   Ambulation assist      Assist level: Moderate Assistance - Patient 50 - 74% Assistive device: Walker-rolling Max distance: 55ft   Walk 10 feet activity   Assist     Assist level: Moderate Assistance - Patient - 50 - 74% Assistive device: Walker-rolling   Walk 50 feet activity   Assist Walk 50 feet with 2 turns activity did not occur: Safety/medical  concerns         Walk 150 feet activity   Assist Walk 150 feet activity did not occur: Safety/medical concerns         Walk 10 feet on uneven surface  activity   Assist Walk 10 feet on uneven surfaces activity did not occur: Safety/medical concerns         Wheelchair     Assist Is the patient using a wheelchair?: Yes Type of Wheelchair: Manual    Wheelchair assist level: Supervision/Verbal cueing Max wheelchair distance: 150'    Wheelchair 50 feet with 2 turns activity    Assist        Assist Level: Supervision/Verbal cueing   Wheelchair 150 feet activity     Assist      Assist Level: Supervision/Verbal cueing   Blood pressure 104/60, pulse 61, temperature 98.1 F (36.7 C), resp. rate 18, height 6\' 1"  (1.854 m), weight 87.5 kg, SpO2 99%.  Medical Problem List and Plan: 1. 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  Bleeding during catheterization/worsening anemia: decrease Heparin to q12H, SCDs ordered             -antiplatelet therapy: Aspirin 81 mg daily 3. Pain Management: Tylenol as needed  3/9- no pain 4. Situational depression:  Neuropsych consulted. Offered chaplain consult but patient defers. Prozac 20 mg daily, Xanax 0.25 mg 3 times daily as needed               -antipsychotic agents: N/A 5. Neuropsych/cognition: This patient is capable of making decisions on his own behalf. 6. Skin/Wound Care: Routine skin checks 7. Fluids/Electrolytes/Nutrition: Routine in and outs with follow-up chemistries 8.  End-stage renal disease.  Hemodialysis per renal services, continue  9.  Type 2 diabetes mellitus, hemoglobin A1c 5.6. Decrease Lantus to 20U.  D/c feeding supplement  CBG (last 3)  Recent Labs    01/06/24 1627 01/06/24 2127 01/07/24 0616  GLUCAP 128* 172* 92    10.  Severe spinal  canal stenosis and cord compression at C3-4.  Discussed with Dr. Wynetta Emery neurosurgery placed on trial of steroids.  No current intervention at this time.  Needs to f/u with NSU after d/c.   11.  Anemia of chronic disease.  Follow-up CBC  12.  Glaucoma.  Legally blind  left eye.  Continue eyedrops  13.  BPH. Continue Flomax 0.4 mg daily  14.  Hyperlipidemia.  Continue Lipitor  15.  Hypertension.  Monitor with increased mobility. Reviewed and well controlled Vitals:   01/06/24 2012 01/07/24 0414  BP: 122/67 104/60  Pulse: 70 61  Resp: 18 18  Temp: 97.9 F (36.6 C) 98.1 F (36.7 C)  SpO2: 100% 99%     16.  Recent cholecystitis.  Status post cholecystostomy tube placement drainage of abscess 12/16.  Follow-up outpatient  17.  Acute diarrhea.    continue imodium, d/c senna  18. 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 Bradycardia: medications reviewed and he is not on any heart rate lowering agents  resolved  20. Poor intake: recommended eating fruits since these are filled with fiber and will bulk his stool  21. Leukocytosis: resolved, continue keflex  22. Decreased urinary output: UA/UC ordered, discussed UA is positive, keflex started, f/u UC, greater than 100,000 Klebsiella pneumoniae,S to keflex, still symptomatic after 3 d abx, suspect bladder spasms will add antispasmodic low dose monitor for retention  23. Generalized weakness:CVA + SCI + debility, increase D3 to 3,000U daily  24. Decreased urination: discussed this could be 2/2 UTI or HD, continue keflex, stop date added  25. Bladder cramps, discussed pyridium but contraindicated based on patient's medical conditions, nursing note reviewed and oxycodone provided relief, antibiotic changed to Bactrim given that Keflex does not appear to be helping  26. Anemia: worsening, repeat stool ocult ordered, discussed with nursing  27. Urinary retention: consulting urology since nursing  unable to catheterize patient, foley catheter placed and is not to be removed until outpatient assessment as per urology    LOS: 15 days A FACE TO FACE EVALUATION WAS PERFORMED  Clint Bolder P Nikoleta Dady 01/07/2024, 10:23 AM

## 2024-01-07 NOTE — Progress Notes (Signed)
 Physical Therapy Weekly Progress Note  Patient Details  Name: Philip Benjamin MRN: [id number] Date of Birth: March 21, 1951  Beginning of progress report period: December 31, 2023 End of progress report period: January 07, 2024   [name] is making slower than anticipated progress this reporting period. Barriers to progression include generalized weakness and deconditioning, spasticity in RLE, joint tightness, vision impairments, fluctuance in fatigue levels, and incontinence. He currently requires min/modA for bed mobility, min/modA for squat pivot vs stand pivot transfers, min/modA for short distance gait up to 15-76ft. It has been unsafe to attempt stairs at this time due to RLE weakness and severe balance impairments.   Patient continues to demonstrate the following deficits muscle weakness and muscle joint tightness, decreased cardiorespiratoy endurance, decreased visual acuity and decreased visual perceptual skills,  decreased initiation, decreased attention, decreased awareness, and decreased problem solving, and decreased standing balance, decreased postural control, hemiplegia, and decreased balance strategies and therefore will continue to benefit from skilled PT intervention to increase functional independence with mobility.  Patient not progressing toward long term goals.  See goal revision..  Plan of care revisions: See LTG POC note for details.  PT Short Term Goals Week 3:  PT Short Term Goal 1 (Week 3): STG = LTG due to ELOS   Therapy Documentation Precautions:  Precautions Precautions: Fall Precaution/Restrictions Comments: R hemi Restrictions Weight Bearing Restrictions Per Provider Order: No Other Position/Activity Restrictions: r hemiparesis General:     [name] P Lauranne Beyersdorf PT 01/07/2024, 10:22 AM

## 2024-01-07 NOTE — Consult Note (Signed)
 Urology Consult Note   Requesting Attending Physician:  Horton Chin, MD Service Providing Consult: Urology  Consulting Attending: Dr. Arita Miss   Reason for Consult:  hematuria and urinary retention  HPI: Philip Benjamin is seen in consultation for reasons noted above at the request of Raulkar, Drema Pry, MD. patient initially presented to [facility] on 12/18/2023 with right sided weakness x 48 hours.  He had been hospitalized multiple times towards the end of 2024 for Pseudomonas UTI, lobar pneumonia, and COVID-19.  He was admitted due to right-sided weakness.  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    He has been in inpatient rehabilitation at [facility] since 12/23/2023.  Despite being ESRD M/W/F dialysis, he still urinates fairly regularly per his report.  He recently had a MDRO Klebsiella UTI, but he appears to receive adequate treatment for this.  When investigating ongoing bladder spasms patient was found to be retaining around 800 cc of urine.  I confirmed this with bladder scan of bladder on arrival.  Nursing attempted to place straight catheter with resultant bleeding.  He then attempted a coud and got much more blood, prompting a call to urology.  On my arrival patient's was trying to urinate without success.  Though clearly nervous, he was amenable to proceeding with Foley catheter placement.  Please see separate procedure note.  He reports history with a urologist, though he cannot recall why and has not seen a urologist since his retired.  ------------------  Assessment:  73 y.o. male with urinary retention, unable to place foley, and hematuria.   Recommendations:  #Urinary retention #ESRD #difficult foley #UTI  Traumatic Foley insertion resulting in hematuria and false passage, requiring bedside cystoscopy.  66f council catheter placed.  Will remain for 1 week to allow time for urethral trauma to heal.  Schedule a voiding trial versus catheter exchange  in clinic no more than 30d from now.  Please do not remove this prematurely without discussing with urology.  All bleeding was from urethral trauma and minimal.  No prostatic bleeding or blood in the urine was appreciated during cystoscopy.  Clear yellow urine in foley tubing.  Pt appears to have gotten adequate treatment for his UTI. Will not require additional ABX unless symptomatic.   Continue Flomax if no medical contraindication.   Urology will sign off at this time.  Please feel free to call with questions or concerns   Case and plan discussed with Dr. Arita Miss  Past Medical History: Past Medical History:  Diagnosis Date   BPH (benign prostatic hyperplasia)    Chronic kidney disease    ckd stage 3   Diabetes mellitus without complication (HCC)    Hypertension     Past Surgical History:  Past Surgical History:  Procedure Laterality Date   AV FISTULA PLACEMENT Left 01/02/2024   Procedure: ARTERIOVENOUS (AV) FISTULA CREATION;  Surgeon: Nada Libman, MD;  Location: [facility] OR;  Service: Vascular;  Laterality: Left;   EYE SURGERY     INGUINAL HERNIA REPAIR Bilateral 01/23/2018   Procedure: BILATERAL OPEN INGUINAL HERNIA REPAIR WITH MESH;  Surgeon: Jimmye Norman, MD;  Location: [facility] OR;  Service: General;  Laterality: Bilateral;   INSERTION OF MESH Bilateral 01/23/2018   Procedure: INSERTION OF MESH;  Surgeon: Jimmye Norman, MD;  Location: [facility] OR;  Service: General;  Laterality: Bilateral;   IR FLUORO GUIDE CV LINE RIGHT  08/15/2023   IR PATIENT EVAL TECH 0-60 MINS  11/18/2023   IR PERC  CHOLECYSTOSTOMY  10/06/2023   IR US GUIDE BX ASP/DRAIN  10/06/2023   IR US GUIDE VASC ACCESS RIGHT  08/15/2023   VASECTOMY     ~1985    Medication: Current Facility-Administered Medications  Medication Dose Route Frequency Provider Last Rate Last Admin   acetaminophen (TYLENOL) tablet 650 mg  650 mg Oral Q4H PRN Rhyne, Samantha J, PA-C       Or   acetaminophen (TYLENOL) 160 MG/5ML solution 650 mg  650  mg Per Tube Q4H PRN Rhyne, Samantha J, PA-C       Or   acetaminophen (TYLENOL) suppository 650 mg  650 mg Rectal Q4H PRN Rhyne, Samantha J, PA-C       ALPRAZolam Prudy Feeler) tablet 0.25 mg  0.25 mg Oral TID PRN Doreatha Massed J, PA-C   0.25 mg at 01/04/24 2100   amLODipine (NORVASC) tablet 5 mg  5 mg Oral Daily Rhyne, Samantha J, PA-C   5 mg at 01/06/24 0805   aspirin chewable tablet 81 mg  81 mg Oral Daily Dara Lords, PA-C   81 mg at 01/07/24 0758   atorvastatin (LIPITOR) tablet 40 mg  40 mg Oral Daily Dara Lords, PA-C   40 mg at 01/06/24 2152   cephALEXin (KEFLEX) capsule 500 mg  500 mg Oral Q12H Raulkar, Drema Pry, MD   500 mg at 01/07/24 0758   Chlorhexidine Gluconate Cloth 2 % PADS 6 each  6 each Topical Q0600 Berenda Morale, NP   6 each at 01/07/24 2595   cholecalciferol (VITAMIN D3) 25 MCG (1000 UNIT) tablet 3,000 Units  3,000 Units Oral Daily Horton Chin, MD   3,000 Units at 01/07/24 0758   cycloSPORINE (RESTASIS) 0.05 % ophthalmic emulsion 1 drop  1 drop Both Eyes BID Rhyne, Ames Coupe, PA-C   1 drop at 01/07/24 0756   Darbepoetin Alfa (ARANESP) injection 60 mcg  60 mcg Subcutaneous Q Thu-1800 Rhyne, Samantha J, PA-C   60 mcg at 01/01/24 1730   dorzolamide (TRUSOPT) 2 % ophthalmic solution 1 drop  1 drop Both Eyes BID Rhyne, Samantha J, PA-C   1 drop at 01/07/24 0759   And   timolol (TIMOPTIC) 0.5 % ophthalmic solution 1 drop  1 drop Both Eyes BID Rhyne, Samantha J, PA-C   1 drop at 01/07/24 0800   fentaNYL (SUBLIMAZE) injection 50 mcg  50 mcg Intravenous Once Rhyne, Samantha J, PA-C       FLUoxetine (PROZAC) capsule 20 mg  20 mg Oral Daily Rhyne, Samantha J, PA-C   20 mg at 01/07/24 0758   heparin injection 5,000 Units  5,000 Units Subcutaneous Q12H Raulkar, Drema Pry, MD       [START ON 01/08/2024] insulin glargine (LANTUS) injection 19 Units  19 Units Subcutaneous Daily Raulkar, Drema Pry, MD       latanoprost (XALATAN) 0.005 % ophthalmic solution 1 drop  1 drop  Left Eye QHS Rhyne, Samantha J, PA-C   1 drop at 01/06/24 2151   loperamide (IMODIUM) capsule 2 mg  2 mg Oral PRN Rhyne, Ames Coupe, PA-C   2 mg at 12/31/23 1209   multivitamin (RENA-VIT) tablet 1 tablet  1 tablet Oral QHS Rhyne, Samantha J, PA-C   1 tablet at 01/06/24 2152   oxybutynin (DITROPAN) tablet 2.5 mg  2.5 mg Oral Q8H PRN Erick Colace, MD   2.5 mg at 01/07/24 0214   sulfamethoxazole-trimethoprim (BACTRIM) 400-80 MG per tablet 1 tablet  1 tablet Oral Daily Noah Delaine  P, RPH       tamsulosin (FLOMAX) capsule 0.4 mg  0.4 mg Oral Q supper Rhyne, Samantha J, PA-C   0.4 mg at 01/06/24 1732   tiZANidine (ZANAFLEX) tablet 2 mg  2 mg Oral BID Raulkar, Drema Pry, MD        Allergies: No Known Allergies  Social History: Social History   Tobacco Use   Smoking status: Never   Smokeless tobacco: Never  Substance Use Topics   Alcohol use: Never   Drug use: Never    Family History History reviewed. No pertinent family history.  Review of Systems  Genitourinary:  Positive for hematuria.     Objective   Vital signs in last 24 hours: BP 104/60 (BP Location: Right Arm)   Pulse 61   Temp 98.1 F (36.7 C)   Resp 18   Ht 6\' 1"  (1.854 m)   Wt 87.5 kg   SpO2 99%   BMI 25.45 kg/m   Physical Exam: General: A&O, resting, appropriate HEENT: [place]/AT Pulmonary: Normal work of breathing Cardiovascular: no cyanosis Abdomen: Soft, NTTP, nondistended GU: 54f council catheter in place draining clear yellow urine   Most Recent Labs: Lab Results  Component Value Date   WBC 7.6 01/07/2024   HGB 8.3 (L) 01/07/2024   HCT 24.8 (L) 01/07/2024   PLT 175 01/07/2024    Lab Results  Component Value Date   NA 130 (L) 01/07/2024   K 4.0 01/07/2024   CL 94 (L) 01/07/2024   CO2 27 01/07/2024   BUN 36 (H) 01/07/2024   CREATININE 3.22 (H) 01/07/2024   CALCIUM 8.9 01/07/2024   MG 1.6 (L) 12/23/2023   PHOS 4.0 01/07/2024    Lab Results  Component Value Date   INR 1.2 10/05/2023      Urine Culture: @[id number] (laburin,org,[id number])@   IMAGING: No results found.  ------  Elmon Kirschner, NP Pager: [phone]   Please contact the urology consult pager with any further questions/concerns.

## 2024-01-07 NOTE — Progress Notes (Signed)
 Physical Therapy Session Note  Patient Details  Name: Philip Benjamin MRN: [id number] Date of Birth: 08-18-1951  Today's Date: 01/07/2024 PT Individual Time: 4098-1191 + 1135-1159 PT Individual Time Calculation (min): 54 min  + 25 min  Short Term Goals: Week 2:  PT Short Term Goal 1 (Week 2): STG = LTG due to ELOS  Skilled Therapeutic Interventions/Progress Updates:      1st session: Pt in bed to start with LPN present giving morning medications. Pt reports no pain but reports having poor sleep last night due to persistent "bladder spasms." Continues to report urge to void but difficulty voiding on his own with little to no production. Relayed to medical team  Donned knee-high compression and pants at bed level with maxA for time management. While dressing, patient reports urge to void - provided urinal due to urgency and HOB raised - pt unable to void despite urge.   Supine<>sitting EOB needing heavy min to modA for transitioning to upright at EOB - HOB was flat and no bed rails used. Completed a modA stand pivot transfer to wheelchair with cues for setup and sequencing.   Transported at wheelchair level to main rehab gym. Focused remainder of session on stretching BLE for spasticity management and to improve hip/knee ROM for functional mobility.   Pt positioned into prone position with x2 pillows under hips to help with lower back comfort. Required mod/maxA for positioning into prone and +2 assist for placement of pillows. Applied passive hip extension and knee flexion for stretching for BLE. Worked on some active knee flexion in prone - able to achieved full ROM on LLE and only partial on RLE (2-/5).  Returned back to sitting EOB with maxA from prone position and then assisted back to wheelchair with a minA squat pivot transfer. Left sitting upright in wheelchair in his room with all needs met - NT made aware of position with plans for bladder scan.     2nd session: Pt lying in bed  - visibly upset. Pt denies pain but reports frustration re: bladder issues. Reports bladder scan showed retained fluid and unable to void - reports unsuccessful cath this AM and now has some blood from irritation. Pt wanting to try to sit on the toilet to try and pee.   Supine<>sitting EOB with minA for trunk support and BLE management. Completes a minA squat pivot transfer from elevated EOB into wheelchair - transported at wheelchair level and then assisted to toilet with a stand pivot while using grab bars - totalA needed for managing his pants/brief in standing. Pt unable to void despite + time. Even turned shower water on to help produce urge. Noted blood dripping from penis   Pt requesting ++ time to try and void. Direct handoff of care to NT.   Therapy Documentation Precautions:  Precautions Precautions: Fall Precaution/Restrictions Comments: R hemi Restrictions Weight Bearing Restrictions Per Provider Order: No Other Position/Activity Restrictions: r hemiparesis General:     Therapy/Group: Individual Therapy  [name] 01/07/2024, 7:55 AM

## 2024-01-07 NOTE — Progress Notes (Signed)
 Patient has required PRN percocet twice this shift for bladder spasms pains. He is concerned the antibiotics are not working . He would like to try a different medication for the discomfort and would like to talk to the doctor today regarding treatment options.

## 2024-01-07 NOTE — Progress Notes (Signed)
 Pt reported having bladder spasms and was medicated with PRN Percocet as it was to early for oxybutynin. He did report he has a small BM yesterday. He refused any medication or medical intervention at this time. He was educated on S/S of UTI vs constipation vs bladder spasms. He verbalized understanding and agreed he will accept medication tomorrow if he does not have a BM on his own. He has bowel sounds x4. His ABD near his bladder is tender to palpation.

## 2024-01-07 NOTE — Progress Notes (Signed)
 Occupational Therapy Session Note  Patient Details  Name: Philip Benjamin MRN: [id number] Date of Birth: [birth year]  Today's Date: 01/07/2024 OT Individual Time: 8657-8469 OT Individual Time Calculation (min): 53 min  OT Individual Time: 1130-1200 OT Individual Time Calculation (min): 30 min  OT Missed Time: 30 min (RN and urologist present providing care for foley catheter placement)  Short Term Goals: Week 2:  OT Short Term Goal 1 (Week 2): Patient will complete stand step toilet transfer with close supervision OT Short Term Goal 2 (Week 2): Pt will pull pants up/down over hips with CGA usign LRAD OT Short Term Goal 3 (Week 2): Pt will donn pull over head shirt with supervision consistently OT Short Term Goal 4 (Week 2): Pt tolerate standing >2 minutes during BADLs or functional activity using LRAD CGA  Skilled Therapeutic Interventions/Progress Updates:     Session 1: Pt received sitting up in wc presenting to be discouraged d/t not sleeping well last night 2/2 bladder spasms- provided therapeutic support and encouragement with Pt receptive to skilled OT session reporting 0/10 pain, however discomfort in Pt's bladder noted- OT offering intermittent rest breaks, repositioning, and therapeutic support to optimize participation in therapy session. MD in/out during session to provide education on catheterization to decreased bladder discomfort as Pt is retaining urine with Pt receptive to education. Pt requesting to attempt to use restroom at beginning of session. Transported Pt into bathroom total A in wc. Pt completed stand pivot <> BSC positioned over toilet using grab bars with CGA +verbal cues for technique. Pt able to doff/donn pants from waist in standing with UE support on grab bars alternating using R/LUE to adjust waist band of pants- assistance required to fully adjust pants over R hip. Increased amount of time provided on to allow for void with Pt noted to have small void in toilet,  however most likely 2/2 to bladder overflow. Positioned Pt at sink for bathe/dress tasks. Pt completed UB bathing using wash mit with min A overall to wash L underarm and LB bathing completing with min A +increased time with assistance provided to wash buttocks in standing position, B UE supported on sink. Pt demonstrating increased challenges maintaining standing position d/t activity tolerance and LB strength deficits with heavy reliance on B UE support on RW. Pt donn overhead shirt with mod A and button shirt max A. Assistance required to fully weave B LEs into pants d/t tight fit of joggers and to bring pants to waist in standing. Assisted Pt back to bed via stand pivot using RW at end of session per RN request for Pt to bed in bed for catheterization. Stand pivot wc > EOB using RW min A for balance, RW management, and trunk control. EOB > supine light mod A to bring B LEs into bed. Pt was left resting in bed with call bell in reach, bed alarm on, and all needs met.    Session 2: Missed 30 minutes skilled OT treatment d/t nursing care. Will attempt to make up time as Pt's schedule and status allow. Pt received reclined in bed presenting tearful d/t recent catheter placement. Provided maximal therapeutic support and engaged Pt in light hearted conversation to support moral with mod improvement, Pt receptive to session reporting discomfort 2/2 catheter placement, however no other pain reported. Pt tearful throughout session and upset that he has not been able to void without use of catheter. Pt also inquisitive about purpose of catheter with education provided. Pt requesting to call DTR  during session. Provided assistance with dialing phone d/t Pt's underlying visual deficits and engaged Pt in phone call with DTR to receive emotional support as a coping technique with noted improved moral following. Sitting EOB, engaged Pt in light ADLs to donn clean brief and pants with assistance required to weave B LEs and  multiple stand required to bring pants to waist as Pt would return to sitting position d/t fatigue. Stand pivot EOB > wc using RW min A for balance and RW management. Pt was left resting in wc with call bell in reach and all needs met.    Therapy Documentation Precautions:  Precautions Precautions: Fall Precaution/Restrictions Comments: R hemi Restrictions Weight Bearing Restrictions Per Provider Order: No Other Position/Activity Restrictions: r hemiparesis  Therapy/Group: Individual Therapy  Philip Benjamin 01/07/2024, 7:57 AM

## 2024-01-07 NOTE — Progress Notes (Signed)
 Pharmacy Antibiotic Note  Philip Benjamin is a 73 y.o. male admitted on 12/23/2023 with UTI.  Pharmacy has been consulted for Septra dosing for Klebsiella UTI.  MD notes still symptomatic after 6 days of cephalexin.   Patient has ESRD , on HD every MWF.  Last HD 3/17 and next HD planned for today 01/07/24.   Plan: Give Septra SS 1 tablet PO now prior to HD, then Septra SS 1 tablet PO daily in evenings x 3 days starting 3/19 after HD, to ensure dose given AFTER hemodialysis on HD days.  Pharmacy will sign off.    Height: 6\' 1"  (185.4 cm) Weight: 87.5 kg (192 lb 14.4 oz) IBW/kg (Calculated) : 79.9  Temp (24hrs), Avg:98.2 F (36.8 C), Min:97.9 F (36.6 C), Max:98.7 F (37.1 C)  Recent Labs  Lab 12/31/23 1110 01/01/24 0628 01/02/24 0902 01/02/24 1224 01/02/24 1420 01/03/24 1051 01/05/24 1109 01/06/24 0458 01/07/24 0503  WBC 14.9*   < > 10.3  --   --  10.6* 8.3 7.3 7.6  CREATININE 2.81*  --   --  2.68* 3.00*  --  3.59*  --  3.22*   < > = values in this interval not displayed.    Estimated Creatinine Clearance: 22.7 mL/min (A) (by C-G formula based on SCr of 3.22 mg/dL (H)).    No Known Allergies  Antimicrobials this admission: Cephalexin 3/13>>3/19 Cefazolin x1 3/14 Septra 3/19>> (3/21)    Microbiology results:  3/13 Urine Cx:  =/> 100K col/ml Klebsiella pneumoniae:  Resistant to all EXCEPT  cefazolin, cefepime, ceftriaxone, imipenem, zosyn, septra.   Thank you for allowing pharmacy to be a part of this patient's care.  Noah Delaine, RPh Clinical Pharmacist 01/07/2024 10:27 AM

## 2024-01-07 NOTE — Plan of Care (Signed)
 DC STAIR GOAL due to safety concern Problem: RH Stairs Goal: LTG Patient will ambulate up and down stairs w/assist (PT) Description: LTG: Patient will ambulate up and down # of stairs with assistance (PT) Outcome: Not Applicable   Downgraded goals to minA due to slower than anticipated progress  Problem: RH Balance Goal: LTG Patient will maintain dynamic standing balance (PT) Description: LTG:  Patient will maintain dynamic standing balance with assistance during mobility activities (PT) Flowsheets (Taken 01/07/2024 1113) LTG: Pt will maintain dynamic standing balance during mobility activities with:: Minimal Assistance - Patient > 75%   Problem: Sit to Stand Goal: LTG:  Patient will perform sit to stand with assistance level (PT) Description: LTG:  Patient will perform sit to stand with assistance level (PT) Flowsheets (Taken 01/07/2024 1113) LTG: PT will perform sit to stand in preparation for functional mobility with assistance level: Minimal Assistance - Patient > 75%   Problem: RH Bed Mobility Goal: LTG Patient will perform bed mobility with assist (PT) Description: LTG: Patient will perform bed mobility with assistance, with/without cues (PT). Flowsheets (Taken 01/07/2024 1113) LTG: Pt will perform bed mobility with assistance level of: Minimal Assistance - Patient > 75%   Problem: RH Bed to Chair Transfers Goal: LTG Patient will perform bed/chair transfers w/assist (PT) Description: LTG: Patient will perform bed to chair transfers with assistance (PT). Flowsheets (Taken 01/07/2024 1113) LTG: Pt will perform Bed to Chair Transfers with assistance level: Minimal Assistance - Patient > 75%   Problem: RH Ambulation Goal: LTG Patient will ambulate in controlled environment (PT) Description: LTG: Patient will ambulate in a controlled environment, # of feet with assistance (PT). Flowsheets (Taken 01/07/2024 1113) LTG: Pt will ambulate in controlled environ  assist needed:: Minimal  Assistance - Patient > 75% LTG: Ambulation distance in controlled environment: 35ft Goal: LTG Patient will ambulate in home environment (PT) Description: LTG: Patient will ambulate in home environment, # of feet with assistance (PT). Flowsheets (Taken 01/07/2024 1113) LTG: Pt will ambulate in home environ  assist needed:: Minimal Assistance - Patient > 75% LTG: Ambulation distance in home environment: 11ft

## 2024-01-07 NOTE — Progress Notes (Signed)
 Attempted to cath this patient with straight cath tip and coude cath tip. When inserting straight cath, much resistance was met in the urethra and minimal blood was seen. Reattempted with coude tip catheter and much more blood was returned in the bag. This nurse pulled out of urethra and called the MD. MD stated that urology consult was necessary. Care ongoing.    Rito Ehrlich, LPN

## 2024-01-07 NOTE — Progress Notes (Signed)
 Urologist NP arrived on the unit to insert foley. 18 French Counsil Cath placed with camera and wire successfully. Was instructed by Urologist to leave in and do not remove. Passed on to [facility] and Dr. Carlis Abbott MD. Care ongoing. No new orders at this time.    Rito Ehrlich, LPN

## 2024-01-07 NOTE — Procedures (Signed)
   Urology Procedure Note:  Patient is a 73 year old male with urinary retention greater than 800 mL.  Nursing could not place catheter with resultant hematuria and false passage.  Please see separate consult note.  Reviewed case and plan with patient.  Consent to proceed with Foley catheter placement was provided.  Patient was prepped and draped in the usual sterile fashion.  10cc of sterile lidocaine infused lubricant was injected directly into the urethra.  After adequate time for anesthetic effect had been provided, I attempted to advance a zip wire, out of consideration for previous trauma.  The wire appeared to be falling into a false passage and would return upwards through the urethra.  At this point the wire was removed and I elected to proceed with cystoscopy.  A significant amount of ragged urethral tissue and bleeding limited by visualization until around the level of the bulbar urethra.  I could clearly see midway through the prostate and and no active bleeding  was noted.  I fed a sensor wire through the cystoscope, which was then offloaded.  I then placed an 38f council tip catheter over wire into the bladder with immediate return of clear yellow urine.  With placement confirmed, the wire was extracted and retention balloon inflated with 10cc of sterile water.  The catheter was placed to gravity drainage without dependent loops and secured with a StatLock to the patient's upper inner thigh.  This concluded the procedure. Around 800cc of clear yellow urine was returned, consistent with the preprocedural ultrasound.  Please leave catheter in place for approximately 7 days for healing of urethral trauma.   Elmon Kirschner, NP [facility] Pager: [phone]

## 2024-01-07 NOTE — Progress Notes (Signed)
   01/07/24 1752  Vitals  Temp 98 F (36.7 C)  Pulse Rate 77  Resp 14  BP 118/75  SpO2 96 %  O2 Device Room Air  Weight 87.7 kg  Type of Weight Post-Dialysis  Oxygen Therapy  Patient Activity (if Appropriate) In bed  Pulse Oximetry Type Continuous  Oximetry Probe Site Changed No  Post Treatment  Dialyzer Clearance Lightly streaked  Hemodialysis Intake (mL) 700 mL  Liters Processed 84  Fluid Removed (mL) 0 mL  Tolerated HD Treatment Yes   Received patient in bed to unit.  Alert and oriented.  Informed consent signed and in chart.   TX duration: Three hours and thirty minutes.  Patient tolerated well.  Transported back to the room  Alert, without acute distress.  Hand-off given to patient's nurse.   Access used: Right chest catheter Access issues: None

## 2024-01-08 DIAGNOSIS — Z515 Encounter for palliative care: Secondary | ICD-10-CM

## 2024-01-08 DIAGNOSIS — Z7189 Other specified counseling: Secondary | ICD-10-CM

## 2024-01-08 LAB — CBC WITH DIFFERENTIAL/PLATELET
Abs Immature Granulocytes: 0.04 10*3/uL (ref 0.00–0.07)
Basophils Absolute: 0 10*3/uL (ref 0.0–0.1)
Basophils Relative: 1 %
Eosinophils Absolute: 0.2 10*3/uL (ref 0.0–0.5)
Eosinophils Relative: 2 %
HCT: 24.2 % — ABNORMAL LOW (ref 39.0–52.0)
Hemoglobin: 8 g/dL — ABNORMAL LOW (ref 13.0–17.0)
Immature Granulocytes: 1 %
Lymphocytes Relative: 9 %
Lymphs Abs: 0.8 10*3/uL (ref 0.7–4.0)
MCH: 30.7 pg (ref 26.0–34.0)
MCHC: 33.1 g/dL (ref 30.0–36.0)
MCV: 92.7 fL (ref 80.0–100.0)
Monocytes Absolute: 1 10*3/uL (ref 0.1–1.0)
Monocytes Relative: 11 %
Neutro Abs: 6.8 10*3/uL (ref 1.7–7.7)
Neutrophils Relative %: 76 %
Platelets: 172 10*3/uL (ref 150–400)
RBC: 2.61 MIL/uL — ABNORMAL LOW (ref 4.22–5.81)
RDW: 11.9 % (ref 11.5–15.5)
WBC: 8.7 10*3/uL (ref 4.0–10.5)
nRBC: 0 % (ref 0.0–0.2)

## 2024-01-08 LAB — RENAL FUNCTION PANEL
Albumin: 2.5 g/dL — ABNORMAL LOW (ref 3.5–5.0)
Anion gap: 10 (ref 5–15)
BUN: 21 mg/dL (ref 8–23)
CO2: 26 mmol/L (ref 22–32)
Calcium: 8.5 mg/dL — ABNORMAL LOW (ref 8.9–10.3)
Chloride: 96 mmol/L — ABNORMAL LOW (ref 98–111)
Creatinine, Ser: 2.54 mg/dL — ABNORMAL HIGH (ref 0.61–1.24)
GFR, Estimated: 26 mL/min — ABNORMAL LOW (ref >60)
Glucose, Bld: 104 mg/dL — ABNORMAL HIGH (ref 70–99)
Phosphorus: 3.1 mg/dL (ref 2.5–4.6)
Potassium: 4 mmol/L (ref 3.5–5.1)
Sodium: 132 mmol/L — ABNORMAL LOW (ref 135–145)

## 2024-01-08 LAB — GLUCOSE, CAPILLARY
Glucose-Capillary: 99 mg/dL (ref 70–99)
Glucose-Capillary: 119 mg/dL — ABNORMAL HIGH (ref 70–99)
Glucose-Capillary: 195 mg/dL — ABNORMAL HIGH (ref 70–99)
Glucose-Capillary: 199 mg/dL — ABNORMAL HIGH (ref 70–99)

## 2024-01-08 MED ORDER — CHLORHEXIDINE GLUCONATE CLOTH 2 % EX PADS: 6.0000 | MEDICATED_PAD | Freq: Every day | CUTANEOUS | Status: DC

## 2024-01-08 MED ORDER — MIDODRINE HCL 5 MG PO TABS
5.0000 mg | ORAL_TABLET | ORAL | Status: DC
  Administered 2024-01-14: 5 mg via ORAL
  Filled 2024-01-08 (×2): qty 1

## 2024-01-08 MED ORDER — CHLORHEXIDINE GLUCONATE CLOTH 2 % EX PADS
6.0000 | MEDICATED_PAD | Freq: Two times a day (BID) | CUTANEOUS | Status: DC
  Administered 2024-01-08 – 2024-01-15 (×12): 6 via TOPICAL

## 2024-01-08 MED ORDER — SORBITOL 70 % SOLN: 30.0000 mL | Freq: Every day | Status: DC | PRN

## 2024-01-08 MED ORDER — HEPARIN SODIUM (PORCINE) 5000 UNIT/ML IJ SOLN
5000.0000 [IU] | Freq: Every day | INTRAMUSCULAR | Status: DC
  Administered 2024-01-09 – 2024-01-15 (×7): 5000 [IU] via SUBCUTANEOUS
  Filled 2024-01-08 (×7): qty 1

## 2024-01-08 NOTE — Progress Notes (Signed)
 [facility] Progress Note   Subjective:    Seen and examined patient at bedside. Urology consulted yesterday for urinary retention and bladder spasms. Noted foley catheter was inserted by Urology. He is still making urine. Patient denies bladder spasms. Still feeling weak. Informed of BPs dropping during HD yesterday. UF held and no fluids were removed. He remains euvolemic and on RA. Next HD 3/21.  Objective Vitals:   01/07/24 1752 01/07/24 2022 01/08/24 0514 01/08/24 0805  BP: 118/75 (!) 118/58 (!) 105/53 (!) 110/56  Pulse: 77 70 63   Resp: 14 17 18    Temp: 98 F (36.7 C) 97.8 F (36.6 C) 98.1 F (36.7 C)   TempSrc:      SpO2: 96% 100% 98%   Weight: 87.7 kg  87.1 kg   Height:       Physical Exam General: Elderly man, NAD. Room air Heart: normal rate Lungs: Bilateral chest rise with no increased work of breathing ABD: soft and non-tender GU: foley cath in place: clear yellow urine Extremities: no edema, warm and well-perfused Dialysis Access: [facility]  Filed Weights   01/07/24 1350 01/07/24 1752 01/08/24 0514  Weight: 87.1 kg 87.7 kg 87.1 kg    Intake/Output Summary (Last 24 hours) at 01/08/2024 0935 Last data filed at 01/08/2024 0700 Gross per 24 hour  Intake 593 ml  Output 1750 ml  Net -1157 ml    Additional Objective Labs: Basic Metabolic Panel: Recent Labs  Lab 01/05/24 1109 01/07/24 0503 01/08/24 0549  NA 129* 130* 132*  K 5.0 4.0 4.0  CL 95* 94* 96*  CO2 21* 27 26  GLUCOSE 152* 101* 104*  BUN 44* 36* 21  CREATININE 3.59* 3.22* 2.54*  CALCIUM 8.9 8.9 8.5*  PHOS 4.3 4.0 3.1   Liver Function Tests: Recent Labs  Lab 01/05/24 1109 01/07/24 0503 01/08/24 0549  ALBUMIN 2.9* 2.6* 2.5*   No results for input(s): "LIPASE", "AMYLASE" in the last 168 hours. CBC: Recent Labs  Lab 01/03/24 1051 01/05/24 1109 01/06/24 0458 01/07/24 0503 01/08/24 0549  WBC 10.6* 8.3 7.3 7.6 8.7  NEUTROABS 8.3*  --  4.9 5.0 6.8  HGB 10.3* 9.3* 8.5* 8.3*  8.0*  HCT 31.1* 28.7* 25.9* 24.8* 24.2*  MCV 92.6 94.1 92.2 92.5 92.7  PLT 193 193 176 175 172   Blood Culture    Component Value Date/Time   SDES URINE, CLEAN CATCH 01/01/2024 1022   SPECREQUEST  01/01/2024 1022    NONE Performed at [facility], [address], [place], [place] [address]    CULT >=100,000 COLONIES/mL KLEBSIELLA PNEUMONIAE (A) 01/01/2024 1022   REPTSTATUS 01/03/2024 FINAL 01/01/2024 1022    Cardiac Enzymes: No results for input(s): "CKTOTAL", "CKMB", "CKMBINDEX", "TROPONINI" in the last 168 hours. CBG: Recent Labs  Lab 01/07/24 0616 01/07/24 1207 01/07/24 1830 01/07/24 2109 01/08/24 0635  GLUCAP 92 97 116* 184* 99   Iron Studies: No results for input(s): "IRON", "TIBC", "TRANSFERRIN", "FERRITIN" in the last 72 hours. Lab Results  Component Value Date   INR 1.2 10/05/2023   Studies/Results: No results found.  Medications:   aspirin  81 mg Oral Daily   atorvastatin  40 mg Oral Daily   Chlorhexidine Gluconate Cloth  6 each Topical Q12H   cholecalciferol  3,000 Units Oral Daily   cycloSPORINE  1 drop Both Eyes BID   darbepoetin (ARANESP) injection - DIALYSIS  60 mcg Subcutaneous Q Thu-1800   dorzolamide  1 drop Both Eyes BID   And  timolol  1 drop Both Eyes BID   fentaNYL (SUBLIMAZE) injection  50 mcg Intravenous Once   FLUoxetine  20 mg Oral Daily   heparin  5,000 Units Subcutaneous Q12H   insulin glargine  19 Units Subcutaneous Daily   latanoprost  1 drop Left Eye QHS   [START ON 01/09/2024] midodrine  5 mg Oral Q M,W,F-HD   multivitamin  1 tablet Oral QHS   sulfamethoxazole-trimethoprim  1 tablet Oral Daily   tamsulosin  0.4 mg Oral Q supper   tiZANidine  2 mg Oral BID    Dialysis Orders: MWF - [facility] 4hr, 350/500, EDW 86.5kg, 2K/2.5Ca, TDC, heparin 1000U/hr - Venofer 50mg  IV q Monday - Mircera IV q 4 weeks (last 2/17)  Assessment/Plan: Hx acute L lentiform nucleus CVA: Now in CIR getting PT/OT. Noted neuro-psych was  consulted. 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 Hx cholecystitis 09/2023: S/p IR chole tube, now removed. Urinary retention/hematuria/bladder spasms: Urology consulted and foley placed. Urine clear yellow, no hematuria ESRD: Continue HD on MWF schedule. AVG/F creation with [facility] on 3/14.  First stage.  HTN/volume: Blood pressures on the soft side and informed his Bps dropped on HD yesterday. Remains euvolemic on exam. Stopped low-dose Amlodipine and added Midodrine 5mg  with HD. Monitor trend and adjust if needed. 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 Nutrition: Alb low, continue protein supplements. Diarrhea: per primary, seems improved T2DM Dispo: Noted palliative consulted to discuss GOC.   Salome Holmes, NP [facility] 01/08/2024,9:35 AM  LOS: 16 days

## 2024-01-08 NOTE — Progress Notes (Addendum)
 Occupational Therapy Session Note  Patient Details  Name: Philip Benjamin MRN: [id number] Date of Birth: [birth year]  Today's Date: 01/08/2024 OT Individual Time: 1400-1450 OT Individual Time Calculation (min): 50 min  Short Term Goals: Week 3:  OT Short Term Goal 1 (Week 3): STG= Recently downgraded LTG due to continued medical concerns and physical decline.  Skilled Therapeutic Interventions/Progress Updates:    Patient received seated in wheelchair, looking slightly tearful.  Patient reports that he cannot see at all today - overcast sky has highlighted glaucoma deficits, and he feels he is in blackness.  Patient just had visit from wife and daughter.  Brightened when speaking of them.  Patient agreeable to go to therapy gym, however attempting to have BM prior to leaving his room.  Patient transported to bathroom, then used grab bar to transition to standing to step around to toilet.  Patient needed extended time for BM, and then assistance to perform all aspects of hygiene.  Did help with clothing management - although needs help to pull pants brief up/down left hip.  Left up in wheelchair with Palliative Care consult.  RN and NT aware that patient had BM - No stool smaple needed at this time.    Therapy Documentation Precautions:  Precautions Precautions: Fall Precaution/Restrictions Comments: R hemi Restrictions Weight Bearing Restrictions Per Provider Order: No Other Position/Activity Restrictions: r hemiparesis   Pain:  Denies pain        Therapy/Group: Individual Therapy  Collier Salina 01/08/2024, 2:18 PM

## 2024-01-08 NOTE — Progress Notes (Signed)
 Patient was anxious at beginning of shift. He required frequent reassurance that the foley catheter was draining appropriately and there where no concerns. He did have some blood noted in his brief and around the head of his penis. He was reassured this is to be expected after multiple attempts to place foley. Foley is draining yellow urine with no blood noted in bag. He was medicated with antianxiety medication which was effective. He slept off and on so far this shift. He did report shoulder pain that was relieved with repositioning. No other concerns this shift.

## 2024-01-08 NOTE — Plan of Care (Signed)
  Problem: Sit to Stand Goal: LTG:  Patient will perform sit to stand in prep for activites of daily living with assistance level (OT) Description: LTG:  Patient will perform sit to stand in prep for activites of daily living with assistance level (OT) Flowsheets (Taken 01/08/2024 1221) LTG: PT will perform sit to stand in prep for activites of daily living with assistance level: Contact Guard/Touching assist   Problem: RH Eating Goal: LTG Patient will perform eating w/assist, cues/equip (OT) Description: LTG: Patient will perform eating with assist, with/without cues using equipment (OT) Flowsheets (Taken 01/08/2024 1221) LTG: Pt will perform eating with assistance level of: Contact Guard/Touching assist   Problem: RH Grooming Goal: LTG Patient will perform grooming w/assist,cues/equip (OT) Description: LTG: Patient will perform grooming with assist, with/without cues using equipment (OT) Flowsheets (Taken 01/08/2024 1221) LTG: Pt will perform grooming with assistance level of: Contact Guard/Touching assist   Problem: RH Bathing Goal: LTG Patient will bathe all body parts with assist levels (OT) Description: LTG: Patient will bathe all body parts with assist levels (OT) Flowsheets (Taken 01/08/2024 1221) LTG: Pt will perform bathing with assistance level/cueing: Moderate Assistance - Patient 50 - 74% LTG: Position pt will perform bathing: At sink   Problem: RH Dressing Goal: LTG Patient will perform upper body dressing (OT) Description: LTG Patient will perform upper body dressing with assist, with/without cues (OT). Flowsheets (Taken 01/08/2024 1221) LTG: Pt will perform upper body dressing with assistance level of: Moderate Assistance - Patient 50 - 74% Goal: LTG Patient will perform lower body dressing w/assist (OT) Description: LTG: Patient will perform lower body dressing with assist, with/without cues in positioning using equipment (OT) Flowsheets (Taken 01/08/2024 1221) LTG: Pt  will perform lower body dressing with assistance level of: Moderate Assistance - Patient 50 - 74%   Problem: RH Toileting Goal: LTG Patient will perform toileting task (3/3 steps) with assistance level (OT) Description: LTG: Patient will perform toileting task (3/3 steps) with assistance level (OT)  Flowsheets (Taken 01/08/2024 1221) LTG: Pt will perform toileting task (3/3 steps) with assistance level: Moderate Assistance - Patient 50 - 74%   Problem: RH Toilet Transfers Goal: LTG Patient will perform toilet transfers w/assist (OT) Description: LTG: Patient will perform toilet transfers with assist, with/without cues using equipment (OT) Flowsheets (Taken 01/08/2024 1221) LTG: Pt will perform toilet transfers with assistance level of: Minimal Assistance - Patient > 75% Note: Several goals downgraded due to multiple medical concerns, and limited functional use of BUE

## 2024-01-08 NOTE — Progress Notes (Signed)
 Physical Therapy Session Note  Patient Details  Name: Philip Benjamin MRN: [id number] Date of Birth: [birth year]  Today's Date: 01/08/2024 PT Individual Time: 0800-0913 PT Individual Time Calculation (min): 73 min   Short Term Goals: Week 3:  PT Short Term Goal 1 (Week 3): STG = LTG due to ELOS  Skilled Therapeutic Interventions/Progress Updates:      Pt lying in bed to start with RN at the bedside for morning medications. Pt denies any pain but continues to report frustration re: urethral catheter and slower functional progress than anticipated. Encouragement and therapeutic use of self used to help with mood.   Supine<>sitting EOB using hospital bed features needing minA for trunk support and modA for scooting to EOB. Squat pivot transfer completed with minA to wheelchair. Pt brushed teeth at the sinkside needing setupA 2/2 vision impairments and dexterity/FMC impairments in BUE. Pt somewhat excessively brushing his teeth > 5 minutes and needing cues for task termination.   Transported to main rehab gym and wheeled inside // bars.   Sit<>stand in // bars with patient pulling BUE up to stand with minA with assist also needed for foot placement for widening BOS. Pt tolerated static standing for ~1-2 minutes before complaining of lightheadedness. Orthostatics checked: BP sitting; 109/55 HR 69 BP standing: 97/57 HR 84  Pt resumed standing tasks with CGA for balance in // bars - improved standing tolerance for each rep, up to max 4 minutes. Progressed static standing to removing UE support needing minA for balance and standing up to just a few seconds (2-4) without support before losing his balance .  Standing there-ex in // bars with BUE support and CGA/minA for balance: -1x12 mini knee bends -1x8 sit<>stands (relying heavily on BUE for pulling to stand)  Pt returned to his room at the end of treatment. Left sitting upright in wheelchair with needs met. Pt made aware of upcoming therapy  schedule.   Therapy Documentation Precautions:  Precautions Precautions: Fall Precaution/Restrictions Comments: R hemi Restrictions Weight Bearing Restrictions Per Provider Order: No Other Position/Activity Restrictions: r hemiparesis General:     Therapy/Group: Individual Therapy  [name] 01/08/2024, 7:55 AM

## 2024-01-08 NOTE — Consult Note (Signed)
 Palliative Care Consult Note                                  Date: 01/08/2024   Patient Name: Philip Benjamin  DOB: 1951/03/04  MRN: [id number]  Age / Sex: 73 y.o., male  PCP: Kirstie Peri, MD Referring Physician: Horton Chin, MD  Reason for Consultation: Establishing goals of care  HPI/Patient Profile: 73 y.o. male  with past medical history of ESRD on HD, diabetes mellitus type 2, HTN, and hyperlipidemia. He presented to [facility] on 12/18/23 right-sided weakness and admitted with acute infarct within the left lentiform nucleus. He was also found to have severe spinal stenosis with C3-C4 cord compression.  He was admitted to CIR on 12/23/2023.   Palliative Medicine was consulted for goals of care.   Past Medical History:  Diagnosis Date   BPH (benign prostatic hyperplasia)    Chronic kidney disease    ckd stage 3   Diabetes mellitus without complication (HCC)    Hypertension     Subjective:   Extensive chart review has been completed including labs, vital signs, imaging, progress/consult notes, orders, medications and available advance directive documents.   I met with patient at bedside to discuss diagnosis, prognosis, and GOC. He is OOB to the wheelchair, has just finished OT session. No acute complaints, other than feeling "down" about his current situation.   I introduced Palliative Medicine as specialized medical care for people living with serious illness. It focuses on providing relief from the symptoms and stress of a serious illness.   Created space and opportunity for patient to express thoughts and feelings regarding current medical situation. Values and goals of care were attempted to be elicited.  Life Review: Patient is retired from working at a [employer] and later at [employer]. He has been married to his wife for 50 years. They share 1 daughter/[name] and 1 son.  Functional Status: Prior  to admission 2/27, patient lived at home with his wife (who has memory issues - ?dementia). He states that he was ambulatory and independent. Daughter/[name] lives nearby.   GOC Discussion: We discussed patient's current illness and what it means in the larger context of his ongoing co-morbidities. Current clinical status was reviewed. Natural disease trajectory of chronic illness was discussed.  We reviewed patient's medical issues including ESRD on HD, diabetes, acute stroke, severe spinal cord stenosis, and glaucoma.   Patient's stated goal is to return home to be with his wife. He reports feeling depressed about his medical situation and functional status, but is motivated to improve.   Discussed the "what ifs" and encouraged patient to consider what medical interventions he would want in the event his condition were to deteriorate, keeping in mind the concept of quality of life.   A discussion was had today regarding advanced directives. Concepts specific to code status, artifical feeding and hydration, continued IV antibiotics and rehospitalization was had.  The MOST form was introduced and discussed.  Review of Systems  Neurological:  Positive for weakness.    Objective:   Primary Diagnoses: Present on Admission:  Left middle  cerebral artery stroke [facility])   Physical Exam Vitals reviewed.  Constitutional:      General: He is not in acute distress.    Comments: Chronically ill-appearing  Pulmonary:     Effort: Pulmonary effort is normal.  Neurological:     Mental Status: He is alert and oriented to person, place, and time.     Palliative Assessment/Data: PPS 50%     Assessment & Plan:   SUMMARY OF RECOMMENDATIONS   Continue current supportive care Patient's stated goal of care is to improve and return home PMT will continue to follow  Primary Decision Maker: PATIENT  Code Status/Advance Care Planning: Full code  Symptom Management:  Per attending  Prognosis:   Unable to determine  Discharge Planning:  To Be Determined    Thank you for allowing [place] to participate in the care of ADRIEL KESSEN   Time Total: 75 minutes  Greater than 50%  of this time was spent counseling and coordinating care related to the above assessment and plan.  Signed by: Sherlean Foot, NP Palliative Medicine Team  Team Phone # [phone]  For individual providers, please see AMION

## 2024-01-08 NOTE — Progress Notes (Signed)
 PROGRESS NOTE   Subjective/Complaints: Hypotensive, will d/c flomax since now has foley inserted, discussed with patient that urology would like this maintained until outpatient follow-up  ROS:  Pt denies SOB, abd pain, CP, and vision changes, acute pain  +malaise- continues, +depression- situational, +diarrhea, +fatigue, +generalized weakness, +bladder cramps, +urinary retention   Objective:   No results found.  Recent Labs    01/07/24 0503 01/08/24 0549  WBC 7.6 8.7  HGB 8.3* 8.0*  HCT 24.8* 24.2*  PLT 175 172    Recent Labs    01/07/24 0503 01/08/24 0549  NA 130* 132*  K 4.0 4.0  CL 94* 96*  CO2 27 26  GLUCOSE 101* 104*  BUN 36* 21  CREATININE 3.22* 2.54*  CALCIUM 8.9 8.5*     Intake/Output Summary (Last 24 hours) at 01/08/2024 1001 Last data filed at 01/08/2024 0700 Gross per 24 hour  Intake 593 ml  Output 1750 ml  Net -1157 ml         Physical Exam: Vital Signs Blood pressure (!) 110/56, pulse 63, temperature 98.1 F (36.7 C), resp. rate 18, height 6\' 1"  (1.854 m), weight 87.1 kg, SpO2 98%.  General: No acute distress Mood and affect are appropriate Heart: Regular rate and rhythm no rubs murmurs or extra sounds Lungs: Clear to auscultation, breathing unlabored, no rales or wheezes Abdomen: Positive bowel sounds, soft nontender to palpation, nondistended Extremities: No clubbing, cyanosis, or edema  Neuro - unchanged     Comments: RUE- biceps 4+/5, Triceps 4+/5; WE 5-/5; Grip 4/5; FA 4-/5 LUE- Biceps, triceps 5-/5; WE 5-/5; Grip 4+/5; FA 4/5 RLE- HF 4-/5; otherwise 4/5 rest of RLE LLE- 4+/5 throughout Skin:    General: Skin is warm and dry.     Comments: R chest HD catheter-  Neurological:     Mental Status: He is oriented to person, place, and time.     Comments: Patient is alert.  Answers basic questions.  Follows simple commands MAS of 2 in RLE- few beats clonus B/L LE's and MAS  of 1+ in LLE (+) hoffman's Left only  3+ DTRs B/L patella, stable 3/19 Maintaining standing balance in parallel bars during assessment Psychiatric:        Mood and Affect: Mood normal.        Behavior: Behavior normal.     Assessment/Plan: 1. Functional deficits which require 3+ hours per day of interdisciplinary therapy in a comprehensive inpatient rehab setting. Physiatrist is providing close team supervision and 24 hour management of active medical problems listed below. Physiatrist and rehab team continue to assess barriers to discharge/monitor patient progress toward functional and medical goals  Care Tool:  Bathing    Body parts bathed by patient: Right arm, Chest, Abdomen, Front perineal area, Right upper leg, Left upper leg, Face   Body parts bathed by helper: Buttocks, Left arm, Left lower leg, Right lower leg Body parts n/a: Left arm, Buttocks, Left lower leg, Right lower leg   Bathing assist Assist Level: Moderate Assistance - Patient 50 - 74%     Upper Body Dressing/Undressing Upper body dressing   What is the patient wearing?: Pull over shirt  Upper body assist Assist Level: Minimal Assistance - Patient > 75%    Lower Body Dressing/Undressing Lower body dressing      What is the patient wearing?: Pants, Underwear/pull up     Lower body assist Assist for lower body dressing: Moderate Assistance - Patient 50 - 74%     Toileting Toileting    Toileting assist Assist for toileting: Moderate Assistance - Patient 50 - 74%     Transfers Chair/bed transfer  Transfers assist     Chair/bed transfer assist level: Minimal Assistance - Patient > 75%     Locomotion Ambulation   Ambulation assist      Assist level: Moderate Assistance - Patient 50 - 74% Assistive device: Walker-rolling Max distance: 26ft   Walk 10 feet activity   Assist     Assist level: Moderate Assistance - Patient - 50 - 74% Assistive device: Walker-rolling   Walk 50  feet activity   Assist Walk 50 feet with 2 turns activity did not occur: Safety/medical concerns         Walk 150 feet activity   Assist Walk 150 feet activity did not occur: Safety/medical concerns         Walk 10 feet on uneven surface  activity   Assist Walk 10 feet on uneven surfaces activity did not occur: Safety/medical concerns         Wheelchair     Assist Is the patient using a wheelchair?: Yes Type of Wheelchair: Manual    Wheelchair assist level: Supervision/Verbal cueing Max wheelchair distance: 150'    Wheelchair 50 feet with 2 turns activity    Assist        Assist Level: Supervision/Verbal cueing   Wheelchair 150 feet activity     Assist      Assist Level: Supervision/Verbal cueing   Blood pressure (!) 110/56, pulse 63, temperature 98.1 F (36.7 C), resp. rate 18, height 6\' 1"  (1.854 m), weight 87.1 kg, SpO2 98%.  Medical Problem List and Plan: 1. 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  Bleeding during catheterization/worsening anemia: decrease Heparin to q12H, SCDs ordered             -antiplatelet therapy: Aspirin 81 mg daily 3. Pain Management: Tylenol as needed  3/9- no pain 4. Situational depression:  Neuropsych consulted. Offered chaplain consult but patient defers. Prozac 20 mg daily, Xanax 0.25 mg 3 times daily as needed               -antipsychotic agents: N/A 5. Neuropsych/cognition: This patient is capable of making decisions on his own behalf. 6. Skin/Wound Care: Routine skin checks 7. Fluids/Electrolytes/Nutrition: Routine in and outs with follow-up chemistries 8.  End-stage renal disease.  Hemodialysis per renal services, continue  9.  Type 2 diabetes mellitus, hemoglobin A1c 5.6. Decrease Lantus to 20U.  D/c feeding supplement  CBG (last 3)  Recent Labs     01/07/24 1830 01/07/24 2109 01/08/24 0635  GLUCAP 116* 184* 99    10.  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  Discussed with Dr. Wynetta Emery neurosurgery placed on trial of steroids.  No current intervention at this time.  Needs to f/u with NSU after d/c.   11.  Anemia of chronic disease.  Follow-up CBC  12.  Glaucoma.  Legally blind left eye.  Continue eyedrops  13.  BPH. Continue Flomax 0.4 mg daily  14.  Hyperlipidemia.  Continue Lipitor  15.  Hypertension.  Monitor with increased mobility. Reviewed and well controlled Vitals:   01/08/24 0514 01/08/24 0805  BP: (!) 105/53 (!) 110/56  Pulse: 63   Resp: 18   Temp: 98.1 F (36.7 C)   SpO2: 98%      16.  Recent cholecystitis.  Status post cholecystostomy tube placement drainage of abscess 12/16.  Follow-up outpatient  17.  Acute diarrhea.    continue imodium, d/c senna  18. 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 Bradycardia: medications reviewed and he is not on any heart rate lowering agents  resolved  20. Poor intake: recommended eating fruits since these are filled with fiber and will bulk his stool  21. Leukocytosis: resolved, continue keflex  22. Decreased urinary output: UA/UC ordered, discussed UA is positive, keflex started, f/u UC, greater than 100,000 Klebsiella pneumoniae,S to keflex, still symptomatic after 3 d abx, suspect bladder spasms will add antispasmodic low dose monitor for retention  23. Generalized weakness:CVA + SCI + debility, increase D3 to 3,000U daily  24. Decreased urination: discussed this could be 2/2 UTI or HD, see below  25. Bladder cramps: resolved, complete course of Bactrim  26. Anemia: continues to drop, stool occult ordered, messaged nursing to please repeat, heparin decreased to daily  27. Urinary retention: foley placed by urology, continue until outpatient follow-up  28. Hypotension: d/c flomax since foley is now  inserted and hypotension could be contributing to weakness    LOS: 16 days A FACE TO FACE EVALUATION WAS PERFORMED  Philip Benjamin P Philip Benjamin 01/08/2024, 10:01 AM

## 2024-01-08 NOTE — Progress Notes (Signed)
 Occupational Therapy Weekly Progress Note  Patient Details  Name: Philip Benjamin MRN: [id number] Date of Birth: 08-22-1951  Beginning of progress report period: December 31, 2023 End of progress report period: January 08, 2024  Today's Date: 01/08/2024 OT Individual Time: 4098-1191 OT Individual Time Calculation (min): 70 min    Patient has met 0 of 4 short term goals.  Patient has had further decline with urinary tract infection, bladder spasms, urinary retention, edema in hands/ feet, and continued decrease balance/ functional use of BUE.  Several LTG's downgraded to address decline in functioning  Patient continues to demonstrate the following deficits: muscle weakness, decreased cardiorespiratoy endurance, unbalanced muscle activation and decreased coordination, decreased visual acuity, and decreased standing balance and decreased balance strategies and therefore will continue to benefit from skilled OT intervention to enhance overall performance with BADL and Reduce care partner burden.  Patient not progressing toward long term goals.  See goal revision..  Plan of care revisions: see updated care plan.  OT Short Term Goals Week 2:  OT Short Term Goal 1 (Week 2): Patient will complete stand step toilet transfer with close supervision OT Short Term Goal 2 (Week 2): Pt will pull pants up/down over hips with CGA usign LRAD OT Short Term Goal 2 - Progress (Week 2): Not met OT Short Term Goal 3 (Week 2): Pt will donn pull over head shirt with supervision consistently OT Short Term Goal 3 - Progress (Week 2): Not met OT Short Term Goal 4 (Week 2): Pt tolerate standing >2 minutes during BADLs or functional activity using LRAD CGA OT Short Term Goal 4 - Progress (Week 2): Not met Week 3:  OT Short Term Goal 1 (Week 3): STG= Recently downgraded LTG due to continued medical concerns and physical decline.  Skilled Therapeutic Interventions/Progress Updates:    Patient received seated in  wheelchair reporting completing PT this am.  Patient reports walking in parallel bars.  Patient agreeable to sponge bathing at sink.  Patient does not shower due to port in chest for HD.  Patient was issued wash mitt but reports he does not feel comfortable using it.  Later patient said he may benefit from additional practice.  Patient has limited hand function due to cervical stenosis even prior to this hospitalization.  Patient needs mod assist to remove front opening shirt and tshirt prior to bathing self.  Patient now with indwelling catheter - thus requiring even greater assistance for LB dressing/ undressing.  Patient indicates that catheter is short term.  Hopes it is removed next week.  Patient educated and able to practice with button hook.  Patient always wears a button front shirt for HD to allow access to chest port.  Patient pleased with his ability to manage button hook with shirt in lap.  Will try with shirt on his person tomorrow.  Patient with poor stand balance today - RLE buckling with weight shift toward right.  Patient appears and reports fatigue.   Gauze applied to right second toe - abrasion between 2/3rd toe on right foot now with small wound.  Left shoes off to reduce friction, patient with longer toenail on 3rd toe.   Left up in wheelchair with safety belt in place and engaged and call bell/ personal items in reach.    Therapy Documentation Precautions:  Precautions Precautions: Fall Precaution/Restrictions Comments: R hemi Restrictions Weight Bearing Restrictions Per Provider Order: No Other Position/Activity Restrictions: r hemiparesis   Pain: Denies pain  Therapy/Group: Individual Therapy  Edelmira Gallogly,  Lanice Shirts 01/08/2024, 12:30 PM

## 2024-01-09 LAB — GLUCOSE, CAPILLARY
Glucose-Capillary: 94 mg/dL (ref 70–99)
Glucose-Capillary: 107 mg/dL — ABNORMAL HIGH (ref 70–99)
Glucose-Capillary: 118 mg/dL — ABNORMAL HIGH (ref 70–99)
Glucose-Capillary: 67 mg/dL — ABNORMAL LOW (ref 70–99)
Glucose-Capillary: 100 mg/dL — ABNORMAL HIGH (ref 70–99)

## 2024-01-09 MED ORDER — ALTEPLASE 2 MG IJ SOLR: 2.0000 mg | Freq: Once | INTRAMUSCULAR | Status: DC | PRN

## 2024-01-09 MED ORDER — PENTAFLUOROPROP-TETRAFLUOROETH EX AERO: 1.0000 | INHALATION_SPRAY | CUTANEOUS | Status: DC | PRN

## 2024-01-09 MED ORDER — HEPARIN SODIUM (PORCINE) 1000 UNIT/ML DIALYSIS
1000.0000 [IU] | INTRAMUSCULAR | Status: DC | PRN
  Filled 2024-01-09: qty 1

## 2024-01-09 MED ORDER — LIDOCAINE-PRILOCAINE 2.5-2.5 % EX CREA: 1.0000 | TOPICAL_CREAM | CUTANEOUS | Status: DC | PRN

## 2024-01-09 MED ORDER — LIDOCAINE HCL (PF) 1 % IJ SOLN: 5.0000 mL | INTRAMUSCULAR | Status: DC | PRN

## 2024-01-09 MED ORDER — HEPARIN SODIUM (PORCINE) 1000 UNIT/ML DIALYSIS: 1000.0000 [IU] | INTRAMUSCULAR | Status: DC | PRN

## 2024-01-09 NOTE — Progress Notes (Signed)
 Hypoglycemic Event  CBG: 67  Treatment: 4 oz juice/soda ate late dinner just got back from dialysis  Symptoms: None  Follow-up CBG: Time:2237 CBG Result:100  Possible Reasons for Event: Inadequate meal intake  Comments/MD notified:Treated per standing order set. Will secure chat on morning rounds    Azana Kiesler, Sylvie Farrier

## 2024-01-09 NOTE — Progress Notes (Signed)
 Physical Therapy Session Note  Patient Details  Name: Philip Benjamin MRN: [id number] Date of Birth: 25-Jul-1951  Today's Date: 01/09/2024 PT Individual Time: 0800-0900+ 1045-1200   Short Term Goals: Week 2:  PT Short Term Goal 1 (Week 2): STG = LTG due to ELOS   Skilled Therapeutic Interventions/Progress Updates:      1st session: Pt in bed to start - LPN at bedside administering eye drops. Pt has no complaints of pain but reports feeling frustration re: his vision - he's concerned his vision is worse due to dialysis.   Supine<>Sitting EOB with hospital bed features and minA for trunk support. Assist needed to manage his uretheral catheter as well. Completed squat pivot transfer at minA level into wheelchair with bed height raised.   Transported to main rehab gym for energy conservation.  Instructed in sit<>stand 1x8 to RW with CGA/minA - cues for awareness of foot placement to ensure wide enough BOS and instruction on hand placement to help with boosting to rise. Needs minA for standing balance due to posterior bias.   Completed 1 minute of standing marching with RW support and CGA/minA for balance - after ~45 seconds his R hip flexor fatigues and is unable to clear his foot off the ground.   Short distance gait training 2x79ft with minA and RW - cues for step length on R and reciprocal stepping.   Assisted onto mat table for mat table strengthening: -active hamstring stretching 3x30 second holds in long sitting positoin -long sitting med ball raises (4lb) for core activation -modified sit-ups 1x10 in long sitting -L sidelying clamshells 2x10 -L sidelying hip flexion/extion in antigravity position with assist for support - 2x10  Pt able to transition from lying flat in supine to EOM with only supervision assist!!  Returned to his room at end of session and left sitting upright in his w/c with needs met.      2nd session: Pt sitting up in wheelchair - has no reports of  pain.  Transported patient to day room rehab gym at wheelchair level for energy conservation. MinA for squat pivot transfer onto the Nustep. SetupA needed for RLE onto Nustep. He completed x10 minutes L5 resistance using BUE/BLE with emphasis on full AROM bilaterally and keeping cadence > 30spm. Pt achieved a total of 350 steps.   Wheeled to main rehab gym to initiate and begin stair training. Pt confirms 2-3 STE home with 2 hand rails and can reach both. He has a full flight of stair to access bed/bath but has been sleeping on the couch for the past few months due to difficulty with stair navigation. Pt may require hospital bed pending progress.   Demonstrated to patient safe technique for stair navigation with L foot leading ascent and R leading descent - educated on completing via step-to pattern while forward facing. He was able to navigate up/down x4 + x4 + 4 with heavy minA to manage posterior lean and for general stability. Seated rest breaks b/w efforts due to fatigue.   Spent time problem solving home setup. Used [employer] to pull up his house to help better understand setup. Pt is required to navigate up x3 6" steps and then x1 4" threshold to enter his home with 2 hand rails. PTA, he has been living in the basement of the home which can be accessed via w/c from outside with glass doors. Basement has a bedroom but no bathroom. Pt would like to be upstairs with his wife. Discussed recommendation  for ramped entrance and stair lift to the 2nd floor to help improve quality of life, reduce caregiver burden, and reduce his falls risk. Pt in agreement but unsure of financial ability to afford this.   Pt returned to his room at end of session and was left sitting upright with his needs met.     Therapy Documentation Precautions:  Precautions Precautions: Fall Precaution/Restrictions Comments: R hemi Restrictions Weight Bearing Restrictions Per Provider Order: No Other Position/Activity  Restrictions: r hemiparesis General:    Therapy/Group: Individual Therapy  Philip Benjamin 01/09/2024, 7:46 AM

## 2024-01-09 NOTE — Progress Notes (Signed)
 CHG bath aand Foley carecompleted by Corlis Hove, NT,

## 2024-01-09 NOTE — Progress Notes (Signed)
 [facility] Progress Note   Subjective:    Seen in room. Feels well. No complaints. Dialysis today   Objective Vitals:   01/08/24 1931 01/09/24 0436 01/09/24 0500 01/09/24 1328  BP: (!) 120/59 121/72  113/63  Pulse: 75 62  71  Resp: 18 16  18   Temp: 98.6 F (37 C) 98.8 F (37.1 C)  98 F (36.7 C)  TempSrc:    Oral  SpO2: 98% 100%  97%  Weight:   86.2 kg   Height:       Physical Exam General: Elderly man, NAD. Room air Heart: normal rate Lungs: Bilateral chest rise with no increased work of breathing ABD: soft and non-tender GU: foley cath in place: clear yellow urine Extremities: no edema, warm and well-perfused Dialysis Access: [facility]  Filed Weights   01/07/24 1752 01/08/24 0514 01/09/24 0500  Weight: 87.7 kg 87.1 kg 86.2 kg    Intake/Output Summary (Last 24 hours) at 01/09/2024 1412 Last data filed at 01/09/2024 1332 Gross per 24 hour  Intake 880 ml  Output 1400 ml  Net -520 ml    Additional Objective Labs: Basic Metabolic Panel: Recent Labs  Lab 01/05/24 1109 01/07/24 0503 01/08/24 0549  NA 129* 130* 132*  K 5.0 4.0 4.0  CL 95* 94* 96*  CO2 21* 27 26  GLUCOSE 152* 101* 104*  BUN 44* 36* 21  CREATININE 3.59* 3.22* 2.54*  CALCIUM 8.9 8.9 8.5*  PHOS 4.3 4.0 3.1   Liver Function Tests: Recent Labs  Lab 01/05/24 1109 01/07/24 0503 01/08/24 0549  ALBUMIN 2.9* 2.6* 2.5*   No results for input(s): "LIPASE", "AMYLASE" in the last 168 hours. CBC: Recent Labs  Lab 01/03/24 1051 01/05/24 1109 01/06/24 0458 01/07/24 0503 01/08/24 0549  WBC 10.6* 8.3 7.3 7.6 8.7  NEUTROABS 8.3*  --  4.9 5.0 6.8  HGB 10.3* 9.3* 8.5* 8.3* 8.0*  HCT 31.1* 28.7* 25.9* 24.8* 24.2*  MCV 92.6 94.1 92.2 92.5 92.7  PLT 193 193 176 175 172   Blood Culture    Component Value Date/Time   SDES URINE, CLEAN CATCH 01/01/2024 1022   SPECREQUEST  01/01/2024 1022    NONE Performed at [facility], [address], [place], [place] [address]    CULT  >=100,000 COLONIES/mL KLEBSIELLA PNEUMONIAE (A) 01/01/2024 1022   REPTSTATUS 01/03/2024 FINAL 01/01/2024 1022    Cardiac Enzymes: No results for input(s): "CKTOTAL", "CKMB", "CKMBINDEX", "TROPONINI" in the last 168 hours. CBG: Recent Labs  Lab 01/08/24 1126 01/08/24 1654 01/08/24 2128 01/09/24 0617 01/09/24 1213  GLUCAP 119* 195* 199* 94 107*   Iron Studies: No results for input(s): "IRON", "TIBC", "TRANSFERRIN", "FERRITIN" in the last 72 hours. Lab Results  Component Value Date   INR 1.2 10/05/2023   Studies/Results: No results found.  Medications:   aspirin  81 mg Oral Daily   atorvastatin  40 mg Oral Daily   Chlorhexidine Gluconate Cloth  6 each Topical Q12H   cholecalciferol  3,000 Units Oral Daily   cycloSPORINE  1 drop Both Eyes BID   darbepoetin (ARANESP) injection - DIALYSIS  60 mcg Subcutaneous Q Thu-1800   dorzolamide  1 drop Both Eyes BID   And   timolol  1 drop Both Eyes BID   fentaNYL (SUBLIMAZE) injection  50 mcg Intravenous Once   FLUoxetine  20 mg Oral Daily   heparin  5,000 Units Subcutaneous Daily   insulin glargine  19 Units Subcutaneous Daily   latanoprost  1 drop Left  Eye QHS   midodrine  5 mg Oral Q M,W,F-HD   multivitamin  1 tablet Oral QHS   sulfamethoxazole-trimethoprim  1 tablet Oral Daily   tiZANidine  2 mg Oral BID    Dialysis Orders: MWF - [facility] 4hr, 350/500, EDW 86.5kg, 2K/2.5Ca, TDC, heparin 1000U/hr - Venofer 50mg  IV q Monday - Mircera IV q 4 weeks (last 2/17)  Assessment/Plan: ESRD: Continue HD on MWF schedule. 1st stage BB AVF creation with VVS on 3/14.   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 Hx cholecystitis 09/2023: S/p IR chole tube, now removed. Urinary retention/bladder spasms - s/p Foley. Per urology.   HTN/volume: Blood pressures on the soft side. Midodrine added here  Anemia of ESRD: Hgb 8.0. iron  replete, on esa. Aranesp 60q q Thur -last on 3/20. Increase dose next week if stlil here  Secondary HPTH: Ca/Phos ok - not on binders, on Vit D3. Nutrition: Alb low, continue protein supplements. Diarrhea: per primary, seems improved T2DM Dispo: Pending discharge date 3/27   [name] PA-C [facility] 01/09/2024,2:12 PM

## 2024-01-09 NOTE — Progress Notes (Signed)
 Patient ID: [facility], male   DOB: 12-Nov-1950, 73 y.o.   MRN: [id number]  Spoke briefly with pt and daughter to give team conference progress toward his goals and discharge date is still 3/27. Pt wants to do better and make progress. Daughter feels she is a Engineer, civil (consulting) and can take care of him and may to attend family education.

## 2024-01-09 NOTE — Progress Notes (Signed)
 PROGRESS NOTE   Subjective/Complaints: Feeling better Patient's chart reviewed- No issues reported overnight Vitals signs stable   ROS:  Pt denies SOB, abd pain, CP, and vision changes, acute pain  +malaise- continues, +depression- situational, +diarrhea, +fatigue, +generalized weakness, +bladder cramps, +urinary retention   Objective:   No results found.  Recent Labs    01/07/24 0503 01/08/24 0549  WBC 7.6 8.7  HGB 8.3* 8.0*  HCT 24.8* 24.2*  PLT 175 172    Recent Labs    01/07/24 0503 01/08/24 0549  NA 130* 132*  K 4.0 4.0  CL 94* 96*  CO2 27 26  GLUCOSE 101* 104*  BUN 36* 21  CREATININE 3.22* 2.54*  CALCIUM 8.9 8.5*     Intake/Output Summary (Last 24 hours) at 01/09/2024 1710 Last data filed at 01/09/2024 1332 Gross per 24 hour  Intake 1120 ml  Output 1000 ml  Net 120 ml         Physical Exam: Vital Signs Blood pressure 113/63, pulse 71, temperature 98 F (36.7 C), temperature source Oral, resp. rate 18, height 6\' 1"  (1.854 m), weight 86.2 kg, SpO2 97%.  General: No acute distress Mood and affect are appropriate Heart: Regular rate and rhythm no rubs murmurs or extra sounds Lungs: Clear to auscultation, breathing unlabored, no rales or wheezes Abdomen: Positive bowel sounds, soft nontender to palpation, nondistended Extremities: No clubbing, cyanosis, or edema  Neuro - unchanged     Comments: RUE- biceps 4+/5, Triceps 4+/5; WE 5-/5; Grip 4/5; FA 4-/5 LUE- Biceps, triceps 5-/5; WE 5-/5; Grip 4+/5; FA 4/5 RLE- HF 4-/5; otherwise 4/5 rest of RLE LLE- 4+/5 throughout Skin:    General: Skin is warm and dry.     Comments: R chest HD catheter-  Neurological:     Mental Status: He is oriented to person, place, and time.     Comments: Patient is alert.  Answers basic questions.  Follows simple commands MAS of 2 in RLE- few beats clonus B/L LE's and MAS of 1+ in LLE (+) hoffman's Left only   3+ DTRs B/L patella, stable 3/21 Maintaining standing balance in parallel bars during assessment Psychiatric:        Mood and Affect: Mood normal.        Behavior: Behavior normal.     Assessment/Plan: 1. Functional deficits which require 3+ hours per day of interdisciplinary therapy in a comprehensive inpatient rehab setting. Physiatrist is providing close team supervision and 24 hour management of active medical problems listed below. Physiatrist and rehab team continue to assess barriers to discharge/monitor patient progress toward functional and medical goals  Care Tool:  Bathing    Body parts bathed by patient: Right arm, Chest, Abdomen, Front perineal area, Right upper leg, Left upper leg, Face, Buttocks, Right lower leg, Left lower leg   Body parts bathed by helper: Left arm Body parts n/a: Left arm, Buttocks, Left lower leg, Right lower leg   Bathing assist Assist Level: Minimal Assistance - Patient > 75%     Upper Body Dressing/Undressing Upper body dressing   What is the patient wearing?: Pull over shirt    Upper body assist Assist Level: Minimal Assistance -  Patient > 75%    Lower Body Dressing/Undressing Lower body dressing      What is the patient wearing?: Pants, Underwear/pull up     Lower body assist Assist for lower body dressing: Moderate Assistance - Patient 50 - 74%     Toileting Toileting    Toileting assist Assist for toileting: Moderate Assistance - Patient 50 - 74%     Transfers Chair/bed transfer  Transfers assist     Chair/bed transfer assist level: Minimal Assistance - Patient > 75%     Locomotion Ambulation   Ambulation assist      Assist level: Moderate Assistance - Patient 50 - 74% Assistive device: Walker-rolling Max distance: 57ft   Walk 10 feet activity   Assist     Assist level: Moderate Assistance - Patient - 50 - 74% Assistive device: Walker-rolling   Walk 50 feet activity   Assist Walk 50 feet with 2  turns activity did not occur: Safety/medical concerns         Walk 150 feet activity   Assist Walk 150 feet activity did not occur: Safety/medical concerns         Walk 10 feet on uneven surface  activity   Assist Walk 10 feet on uneven surfaces activity did not occur: Safety/medical concerns         Wheelchair     Assist Is the patient using a wheelchair?: Yes Type of Wheelchair: Manual    Wheelchair assist level: Supervision/Verbal cueing Max wheelchair distance: 150'    Wheelchair 50 feet with 2 turns activity    Assist        Assist Level: Supervision/Verbal cueing   Wheelchair 150 feet activity     Assist      Assist Level: Supervision/Verbal cueing   Blood pressure 113/63, pulse 71, temperature 98 F (36.7 C), temperature source Oral, resp. rate 18, height 6\' 1"  (1.854 m), weight 86.2 kg, SpO2 97%.  Medical Problem List and Plan: 1. 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  Bleeding during catheterization/worsening anemia: decrease Heparin to q12H, SCDs ordered             -antiplatelet therapy: Aspirin 81 mg daily 3. Pain Management: Tylenol as needed  3/9- no pain 4. Situational depression:  Neuropsych consulted. Offered chaplain consult but patient defers. Prozac 20 mg daily, Xanax 0.25 mg 3 times daily as needed               -antipsychotic agents: N/A 5. Neuropsych/cognition: This patient is capable of making decisions on his own behalf. 6. Skin/Wound Care: Routine skin checks 7. Fluids/Electrolytes/Nutrition: Routine in and outs with follow-up chemistries 8.  End-stage renal disease.  Hemodialysis per renal services, continue  9.  Type 2 diabetes mellitus, hemoglobin A1c 5.6. Decrease Lantus to 20U.  D/c feeding supplement  CBG (last 3)  Recent Labs    01/09/24 0617  01/09/24 1213 01/09/24 1625  GLUCAP 94 107* 118*    10.  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  Discussed with Dr. Wynetta Emery neurosurgery placed on trial of steroids.  No current intervention at this time.  Needs to f/u with NSU after d/c.   11.  Anemia of chronic disease.  Follow-up CBC  12.  Glaucoma.  Legally blind left eye.  Continue eyedrops  13.  BPH. Continue Flomax 0.4 mg daily  14.  Hyperlipidemia.  Continue Lipitor  15.  Hypertension.  Monitor with increased mobility. Reviewed and well controlled Vitals:   01/09/24 0436 01/09/24 1328  BP: 121/72 113/63  Pulse: 62 71  Resp: 16 18  Temp: 98.8 F (37.1 C) 98 F (36.7 C)  SpO2: 100% 97%     16.  Recent cholecystitis.  Status post cholecystostomy tube placement drainage of abscess 12/16.  Follow-up outpatient  17.  Acute diarrhea.    continue imodium, d/c senna  18. 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 Bradycardia: medications reviewed and he is not on any heart rate lowering agents  resolved  20. Poor intake: recommended eating fruits since these are filled with fiber and will bulk his stool  21. Leukocytosis: resolved, continue keflex  22. Decreased urinary output: UA/UC ordered, discussed UA is positive, keflex started, f/u UC, greater than 100,000 Klebsiella pneumoniae,S to keflex, still symptomatic after 3 d abx, suspect bladder spasms will add antispasmodic low dose monitor for retention  23. Generalized weakness:CVA + SCI + debility, increase D3 to 3,000U daily  24. Decreased urination: discussed this could be 2/2 UTI or HD, see below  25. Bladder cramps: resolved, complete course of Bactrim  26. Anemia: continues to drop, stool occult ordered, messaged nursing to please repeat, heparin decreased to daily, recheck CBC with dialysis.   27. Urinary retention: foley placed by urology, continue until outpatient follow-up, discussed that  daughter is a nurse and is accustomed to foley care  28. Hypotension: d/c flomax since foley is now inserted and hypotension could be contributing to weakness, continue tizanidine   29. Diabetic retinopathy: will help to schedule outpatient ophthalmology f/u    LOS: 17 days A FACE TO FACE EVALUATION WAS PERFORMED  Clint Bolder P Alisha Bacus 01/09/2024, 5:10 PM

## 2024-01-09 NOTE — Patient Care Conference (Signed)
 Inpatient RehabilitationTeam Conference and Plan of Care Update Date: 01/07/2024   Time: 11:10 AM    Patient Name: [facility]      Medical Record Number: [id number]  Date of Birth: [birth year] Sex: Male         Room/Bed: 4W02C/4W02C-01 Payor Info: Payor: MEDICARE / Plan: MEDICARE PART A AND B / Product Type: *No Product type* /    Admit Date/Time:  12/23/2023 11:57 AM  Primary Diagnosis:  Left middle cerebral artery stroke [facility])  Hospital Problems: Principal Problem:   Left middle cerebral artery stroke [facility]) Active Problems:   Anxiety state    Expected Discharge Date: Expected Discharge Date: 01/15/24  Team Members Present: Physician leading conference: Dr. Sula Soda Social Worker Present: Dossie Der, LCSW Nurse Present: Chana Bode, RN PT Present: Wynelle Link, PT OT Present: Roney Mans, OT SLP Present: Jeannie Done, SLP PPS Coordinator present : Fae Pippin, SLP     Current Status/Progress Goal Weekly Team Focus  Bowel/Bladder      HD M-W-F; with UTI. Required I+O cath; urology consult for unsuccessful cath    Patient and dtr able to manage foley catheter    Foley cath placed 01/07/24;foley care/peri care education and foley maintenance  Swallow/Nutrition/ Hydration               ADL's   Mod A BADL - limited by decreased use of hands bilaterally and decreased vision, now with UTI   Min assist   Compensatory strategies for function in BUE - Reduce caregiver burden with BADL    Mobility   min/modA sit<>stand, minA stand pivot with RW, modA gait up to 20-63ft wtih RW. Unsafe to attempt stairs (has stairs at home). Using AFO on R foot   CGA/supervision - Will downgrade to minA and anticipate wheelchair level primarily at home with short distance ambulation goals as needed for household mobility  Functional transfers, sit<>stands and standing balance, endurance training, initiating stair training    Communication                 Safety/Cognition/ Behavioral Observations               Pain      N/A          Skin      N/A           Discharge Planning:  Daughter to provide the majority of care, wife has health issues of her own. Working on ramp for steps into home. Await team's recommedations   Team Discussion: Patient post left MCA CVA with a UTI, increased spasticity, slow decline in HGB, fatigue, poor balance, narrow base of support and visual deficits.  Patient on target to meet rehab goals: no, MD adjusting medications for spasticity, able to ambulate up to 10' using a RW with min assist.  *See Care Plan and progress notes for long and short-term goals.   Revisions to Treatment Plan:  Palliative Care Consult Urology consult  Occult stool check Downgraded goals to min assist Lite gait trial  Teaching Needs: Safety, medications, transfers, toileting, etc.   Current Barriers to Discharge: Decreased caregiver support, Home enviroment access/layout, and Hemodialysis  Possible Resolutions to Barriers: Family education Ramp for entry to home recommended     Medical Summary Current Status: bladder cramps, urinary retention, bleeding from catheterization, anemia  Barriers to Discharge: Medical stability  Barriers to Discharge Comments: bladder cramps, urinary retention, bleeding from catheterization, anemia Possible Resolutions to Barriers/Weekly Focus: urology consulted,  bactrim per pharamcy consult placed, tizanidine 2mg  added during the day, heparin decreased to 5,000U q12H, SCDs ordered   Continued Need for Acute Rehabilitation Level of Care: The patient requires daily medical management by a physician with specialized training in physical medicine and rehabilitation for the following reasons: Direction of a multidisciplinary physical rehabilitation program to maximize functional independence : Yes Medical management of patient stability for increased activity during participation in an  intensive rehabilitation regime.: Yes Analysis of laboratory values and/or radiology reports with any subsequent need for medication adjustment and/or medical intervention. : Yes   I attest that I was present, lead the team conference, and concur with the assessment and plan of the team.   Chana Bode B 01/09/2024, 8:39 AM

## 2024-01-09 NOTE — Progress Notes (Signed)
 Occupational Therapy Session Note  Patient Details  Name: Philip [facility] MRN: [id number] Date of Birth: 12-27-1950  Today's Date: 01/09/2024 OT Individual Time: 0902-1002 OT Individual Time Calculation (min): 60 min    Short Term Goals: Week 3:  OT Short Term Goal 1 (Week 3): STG= Recently downgraded LTG due to continued medical concerns and physical decline.  Skilled Therapeutic Interventions/Progress Updates:   Patient received seated in wheelchair following PT session.  Patient wanting to get bathed and dressed in front opening shirt for HD later today.  Patient indicates need to void and able to stand step transfer with use of grab bars and stand step method.  Significant improvement noted to sit to stand and stand to sit transitions this am!  Patient indicating sadness about vision again today - "not a good seeing day."  Patient bathed and dressed himself with max assist for lower body due to indwelling catheter.  Patient able to effectively use wash mitt on right hand to wash BLE.  Patient also able to effectively use button hook with increased time.   Left up in wheelchair with safety belt in place and engaged and call bell/ personal items in reach.    Therapy Documentation Precautions:  Precautions Precautions: Fall Precaution/Restrictions Comments: R hemi Restrictions Weight Bearing Restrictions Per Provider Order: No Other Position/Activity Restrictions: r hemiparesis Pain:  Denies pain      Therapy/Group: Individual Therapy  Collier Salina 01/09/2024, 12:16 PM

## 2024-01-10 DIAGNOSIS — Z992 Dependence on renal dialysis: Secondary | ICD-10-CM

## 2024-01-10 DIAGNOSIS — N186 End stage renal disease: Secondary | ICD-10-CM

## 2024-01-10 DIAGNOSIS — R252 Cramp and spasm: Secondary | ICD-10-CM

## 2024-01-10 DIAGNOSIS — E1165 Type 2 diabetes mellitus with hyperglycemia: Secondary | ICD-10-CM

## 2024-01-10 DIAGNOSIS — R339 Retention of urine, unspecified: Secondary | ICD-10-CM

## 2024-01-10 LAB — GLUCOSE, CAPILLARY
Glucose-Capillary: 81 mg/dL (ref 70–99)
Glucose-Capillary: 129 mg/dL — ABNORMAL HIGH (ref 70–99)
Glucose-Capillary: 153 mg/dL — ABNORMAL HIGH (ref 70–99)
Glucose-Capillary: 192 mg/dL — ABNORMAL HIGH (ref 70–99)

## 2024-01-10 NOTE — Progress Notes (Signed)
 Occupational Therapy Session Note  Patient Details  Name: Philip [facility] MRN: [id number] Date of Birth: 1951-02-26  Today's Date: 01/10/2024 OT Individual Time: 1005-1046 OT Individual Time Calculation (min): 41 min    Short Term Goals: Week 3:  OT Short Term Goal 1 (Week 3): STG= Recently downgraded LTG due to continued medical concerns and physical decline.  Skilled Therapeutic Interventions/Progress Updates:    Patient received seated in wheelchair.  Patient reports feeling down today and has been tearful.  Patient agreeable to washing up and getting into clean clothes.  Patient expressing frustration at getting to Dialysis so late yesterday afternoon.  Reports not sleeping well.  Patient transitioning from sit to stand with contact guard after set up.  Increased assistance needed to don depend and pants due to indwelling catheter and bag.  Patient showing improved ability to use wash mitt.  Left up in wheelchair with safety belt in place and engaged and call bell in reach.    Therapy Documentation Precautions:  Precautions Precautions: Fall Precaution/Restrictions Comments: R hemi Restrictions Weight Bearing Restrictions Per Provider Order: No Other Position/Activity Restrictions: r hemiparesis   Pain:  Denies pain     Therapy/Group: Individual Therapy  Collier Salina 01/10/2024, 11:04 AM

## 2024-01-10 NOTE — Plan of Care (Signed)
  Problem: Consults Goal: RH STROKE PATIENT EDUCATION Description: See Patient Education module for education specifics  Outcome: Progressing   Problem: RH BOWEL ELIMINATION Goal: RH STG MANAGE BOWEL WITH ASSISTANCE Description: STG Manage Bowel with mod I Assistance. Outcome: Progressing   Problem: RH BLADDER ELIMINATION Goal: RH STG MANAGE BLADDER WITH ASSISTANCE Description: STG Manage Bladder With min Assistance Outcome: Progressing   Problem: RH SAFETY Goal: RH STG ADHERE TO SAFETY PRECAUTIONS W/ASSISTANCE/DEVICE Description: STG Adhere to Safety Precautions With cues  Assistance/Device. Outcome: Progressing   Problem: RH PAIN MANAGEMENT Goal: RH STG PAIN MANAGED AT OR BELOW PT'S PAIN GOAL Description: < 4 with prns Outcome: Progressing   Problem: RH KNOWLEDGE DEFICIT Goal: RH STG INCREASE KNOWLEDGE OF DIABETES Description: Patient and family will be able to manage DM using educational resources for medications and dietary modification independently Outcome: Progressing Goal: RH STG INCREASE KNOWLEDGE OF HYPERTENSION Description: Patient and family will be able to manage HTN using educational resources for medications and dietary modification independently Outcome: Progressing Goal: RH STG INCREASE KNOWLEGDE OF HYPERLIPIDEMIA Description: Patient and family will be able to manage HLD using educational resources for medications and dietary modification independently Outcome: Progressing Goal: RH STG INCREASE KNOWLEDGE OF STROKE PROPHYLAXIS Description: Patient and family will be able to manage secondary risks using educational resources for medications and dietary modification independently Outcome: Progressing

## 2024-01-10 NOTE — Progress Notes (Signed)
 [facility] Progress Note   Subjective:    Seen in room. Did ok with dialysis yesterday. A little tired today.    Objective Vitals:   01/09/24 2200 01/09/24 2203 01/10/24 0435 01/10/24 0500  BP: 134/64 134/65 126/60   Pulse: 79 79 67   Resp: 15 19 18    Temp: 98.2 F (36.8 C) 98.2 F (36.8 C) 99.2 F (37.3 C)   TempSrc: Oral  Oral   SpO2: 98% 98% 99%   Weight:    84.5 kg  Height:       Physical Exam General: Elderly man, NAD. Room air Heart: normal rate Lungs: Bilateral chest rise with no increased work of breathing ABD: soft and non-tender GU: foley cath in place: clear yellow urine Extremities: no edema, warm and well-perfused Dialysis Access: [facility]  Filed Weights   01/08/24 0514 01/09/24 0500 01/10/24 0500  Weight: 87.1 kg 86.2 kg 84.5 kg    Intake/Output Summary (Last 24 hours) at 01/10/2024 1338 Last data filed at 01/10/2024 0900 Gross per 24 hour  Intake 240 ml  Output 1200 ml  Net -960 ml    Additional Objective Labs: Basic Metabolic Panel: Recent Labs  Lab 01/05/24 1109 01/07/24 0503 01/08/24 0549  NA 129* 130* 132*  K 5.0 4.0 4.0  CL 95* 94* 96*  CO2 21* 27 26  GLUCOSE 152* 101* 104*  BUN 44* 36* 21  CREATININE 3.59* 3.22* 2.54*  CALCIUM 8.9 8.9 8.5*  PHOS 4.3 4.0 3.1   Liver Function Tests: Recent Labs  Lab 01/05/24 1109 01/07/24 0503 01/08/24 0549  ALBUMIN 2.9* 2.6* 2.5*   No results for input(s): "LIPASE", "AMYLASE" in the last 168 hours. CBC: Recent Labs  Lab 01/05/24 1109 01/06/24 0458 01/07/24 0503 01/08/24 0549  WBC 8.3 7.3 7.6 8.7  NEUTROABS  --  4.9 5.0 6.8  HGB 9.3* 8.5* 8.3* 8.0*  HCT 28.7* 25.9* 24.8* 24.2*  MCV 94.1 92.2 92.5 92.7  PLT 193 176 175 172   Blood Culture    Component Value Date/Time   SDES URINE, CLEAN CATCH 01/01/2024 1022   SPECREQUEST  01/01/2024 1022    NONE Performed at [facility], [address] [place], [place] [address]    CULT >=100,000 COLONIES/mL KLEBSIELLA  PNEUMONIAE (A) 01/01/2024 1022   REPTSTATUS 01/03/2024 FINAL 01/01/2024 1022    Cardiac Enzymes: No results for input(s): "CKTOTAL", "CKMB", "CKMBINDEX", "TROPONINI" in the last 168 hours. CBG: Recent Labs  Lab 01/09/24 1625 01/09/24 2155 01/09/24 2237 01/10/24 0627 01/10/24 1151  GLUCAP 118* 67* 100* 81 129*   Iron Studies: No results for input(s): "IRON", "TIBC", "TRANSFERRIN", "FERRITIN" in the last 72 hours. Lab Results  Component Value Date   INR 1.2 10/05/2023   Studies/Results: No results found.  Medications:   aspirin  81 mg Oral Daily   atorvastatin  40 mg Oral Daily   Chlorhexidine Gluconate Cloth  6 each Topical Q12H   cholecalciferol  3,000 Units Oral Daily   cycloSPORINE  1 drop Both Eyes BID   darbepoetin (ARANESP) injection - DIALYSIS  60 mcg Subcutaneous Q Thu-1800   dorzolamide  1 drop Both Eyes BID   And   timolol  1 drop Both Eyes BID   fentaNYL (SUBLIMAZE) injection  50 mcg Intravenous Once   FLUoxetine  20 mg Oral Daily   heparin  5,000 Units Subcutaneous Daily   insulin glargine  19 Units Subcutaneous Daily   latanoprost  1 drop Left Eye QHS   midodrine  5 mg Oral Q M,W,F-HD   multivitamin  1 tablet Oral QHS   tiZANidine  2 mg Oral BID    Dialysis Orders: MWF - [facility] 4hr, 350/500, EDW 86.5kg, 2K/2.5Ca, TDC, heparin 1000U/hr - Venofer 50mg  IV q Monday - Mircera IV q 4 weeks (last 2/17)  Assessment/Plan: ESRD: Continue HD on MWF schedule. 1st stage BB AVF creation with VVS on 3/14.   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 Hx cholecystitis 09/2023: S/p IR chole tube, now removed. Urinary retention/bladder spasms - s/p Foley. Per urology.   HTN/volume: Blood pressures on the soft side. Midodrine added here  Anemia of ESRD: Hgb 8.0. iron replete, on esa. Aranesp 60q q Thur -last on 3/20. Increase dose next week if stlil here   Secondary HPTH: Ca/Phos ok - not on binders, on Vit D3. Nutrition: Alb low, continue protein supplements. Diarrhea: per primary, seems improved T2DM Dispo: Pending discharge date 3/27   [name] PA-C [facility] 01/10/2024,1:38 PM

## 2024-01-10 NOTE — Progress Notes (Signed)
 Patient is order occult stool x 3. Patient had bowel movement today. Patient flushed sample down toilet. Will attempt to get another sample at next bowel movement. Cletis Media, RN

## 2024-01-10 NOTE — Progress Notes (Signed)
 Physical Therapy Session Note  Patient Details  Name: Philip Benjamin MRN: [id number] Date of Birth: [birth year]  Today's Date: 01/10/2024 PT Individual Time: 1300-1340 PT Individual Time Calculation (min): 40 min   Short Term Goals: Week 3:  PT Short Term Goal 1 (Week 3): STG = LTG due to ELOS  Skilled Therapeutic Interventions/Progress Updates:    Chart reviewed and pt agreeable to therapy. Pt received seated in WC  with no c/o pain. Also of note, pt reporting high fatigue from late HD yesterday, but willing to participate. Session focused on functional transfers and pre-gait activities to improve functional mobility necessary for amb. Pt initiated session with stand pivot transfer to EOB using minA + RW. Pt then completed blocked practice of side stepping using CGA + RW. Pt also completed SLS and slow standing march using CGA + Rw + strong VC for upright posture to promote stability. Pt then educated on sit to stand technique with energy conservation. Pt able to complete sit to stand with good technique using strong VC. Pt then completed repeated sit to stand practice with noted improvement to stand with close S + RW. Pt then required modA to return to bed for BLE management. At end of session, pt was left semi-reclined in bed with alarm engaged, nurse call bell and all needs in reach.     Therapy Documentation Precautions:  Precautions Precautions: Fall Precaution/Restrictions Comments: R hemi Restrictions Weight Bearing Restrictions Per Provider Order: No Other Position/Activity Restrictions: r hemiparesis General:     Therapy/Group: Individual Therapy  Dionne Milo 01/10/2024, 1:44 PM

## 2024-01-10 NOTE — Progress Notes (Signed)
 PROGRESS NOTE   Subjective/Complaints: Pt sleepy this morning. Didn't get back from HD late. Was hypoglycemic and didn't have dinner until 11pm. Otherwise he's doing great!  ROS: Patient denies fever, rash, sore throat, blurred vision, dizziness, nausea, vomiting, diarrhea, cough, shortness of breath or chest pain, joint or back/neck pain, headache, or mood change.    Objective:   No results found.  Recent Labs    01/08/24 0549  WBC 8.7  HGB 8.0*  HCT 24.2*  PLT 172    Recent Labs    01/08/24 0549  NA 132*  K 4.0  CL 96*  CO2 26  GLUCOSE 104*  BUN 21  CREATININE 2.54*  CALCIUM 8.5*     Intake/Output Summary (Last 24 hours) at 01/10/2024 0920 Last data filed at 01/10/2024 0900 Gross per 24 hour  Intake 780 ml  Output 1350 ml  Net -570 ml         Physical Exam: Vital Signs Blood pressure 126/60, pulse 67, temperature 99.2 F (37.3 C), temperature source Oral, resp. rate 18, height 6\' 1"  (1.854 m), weight 84.5 kg, SpO2 99%.  Constitutional: No distress . Vital signs reviewed. HEENT: NCAT, EOMI, oral membranes moist Neck: supple Cardiovascular: RRR without murmur. No JVD    Respiratory/Chest: CTA Bilaterally without wheezes or rales. Normal effort    GI/Abdomen: BS +, non-tender, non-distended Ext: no clubbing, cyanosis, or edema Psych: pleasant and cooperative  Neuro - unchanged     Comments: RUE- biceps 4+/5, Triceps 4+/5; WE 5-/5; Grip 4/5; FA 4-/5 LUE- Biceps, triceps 5-/5; WE 5-/5; Grip 4+/5; FA 4/5 RLE- HF 4-/5; otherwise 4/5 rest of RLE LLE- 4+/5 throughout-  Skin:    General: Skin is warm and dry.     Comments: R chest HD catheter-  Neurological:     Mental Status: He is oriented to person, place, and time.     Comments: Patient is alert.  Answers basic questions.  Follows simple commands MAS of 2 in RLE- few beats clonus B/L LE's and MAS of 1+ in LLE 3+ DTRs B/L patella, stable 3/22       Assessment/Plan: 1. Functional deficits which require 3+ hours per day of interdisciplinary therapy in a comprehensive inpatient rehab setting. Physiatrist is providing close team supervision and 24 hour management of active medical problems listed below. Physiatrist and rehab team continue to assess barriers to discharge/monitor patient progress toward functional and medical goals  Care Tool:  Bathing    Body parts bathed by patient: Right arm, Chest, Abdomen, Front perineal area, Right upper leg, Left upper leg, Face, Buttocks, Right lower leg, Left lower leg   Body parts bathed by helper: Left arm Body parts n/a: Left arm, Buttocks, Left lower leg, Right lower leg   Bathing assist Assist Level: Minimal Assistance - Patient > 75%     Upper Body Dressing/Undressing Upper body dressing   What is the patient wearing?: Pull over shirt    Upper body assist Assist Level: Minimal Assistance - Patient > 75%    Lower Body Dressing/Undressing Lower body dressing      What is the patient wearing?: Pants, Underwear/pull up     Lower body  assist Assist for lower body dressing: Moderate Assistance - Patient 50 - 74%     Toileting Toileting    Toileting assist Assist for toileting: Moderate Assistance - Patient 50 - 74%     Transfers Chair/bed transfer  Transfers assist     Chair/bed transfer assist level: Minimal Assistance - Patient > 75%     Locomotion Ambulation   Ambulation assist      Assist level: Moderate Assistance - Patient 50 - 74% Assistive device: Walker-rolling Max distance: 5ft   Walk 10 feet activity   Assist     Assist level: Moderate Assistance - Patient - 50 - 74% Assistive device: Walker-rolling   Walk 50 feet activity   Assist Walk 50 feet with 2 turns activity did not occur: Safety/medical concerns         Walk 150 feet activity   Assist Walk 150 feet activity did not occur: Safety/medical concerns         Walk 10  feet on uneven surface  activity   Assist Walk 10 feet on uneven surfaces activity did not occur: Safety/medical concerns         Wheelchair     Assist Is the patient using a wheelchair?: Yes Type of Wheelchair: Manual    Wheelchair assist level: Supervision/Verbal cueing Max wheelchair distance: 150'    Wheelchair 50 feet with 2 turns activity    Assist        Assist Level: Supervision/Verbal cueing   Wheelchair 150 feet activity     Assist      Assist Level: Supervision/Verbal cueing   Blood pressure 126/60, pulse 67, temperature 99.2 F (37.3 C), temperature source Oral, resp. rate 18, height 6\' 1"  (1.854 m), weight 84.5 kg, SpO2 99%.  Medical Problem List and Plan: 1. 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  Bleeding during catheterization/worsening anemia: decrease Heparin to q12H, SCDs ordered             -antiplatelet therapy: Aspirin 81 mg daily 3. Pain Management: Tylenol as needed  3/9- no pain 4. Situational depression:  Neuropsych consulted. Offered chaplain consult but patient defers. Prozac 20 mg daily, Xanax 0.25 mg 3 times daily as needed               -antipsychotic agents: N/A 5. Neuropsych/cognition: This patient is capable of making decisions on his own behalf. 6. Skin/Wound Care: Routine skin checks 7. Fluids/Electrolytes/Nutrition: Routine in and outs with follow-up chemistries 8.  End-stage renal disease.  Hemodialysis per renal services, continue  9.  Type 2 diabetes mellitus, hemoglobin A1c 5.6. Decrease Lantus to 20U.  D/c feeding supplement  CBG (last 3)  Recent Labs    01/09/24 2155 01/09/24 2237 01/10/24 0627  GLUCAP 67* 100* 81   3/22 -cbg's running low given what took place last night. Pt not real hungry this morning either. Encouraged  him to keep up with intake.    -has already received 20u lantus this am 10.  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  Discussed with Dr. Wynetta Emery neurosurgery placed on trial of steroids.  No current intervention at this time.  Needs to f/u with NSU after d/c.  11.  Anemia of chronic disease.  Follow-up CBC  12.  Glaucoma.  Legally blind left eye.  Continue eyedrops  13.  BPH. Continue Flomax 0.4 mg daily  14.  Hyperlipidemia.  Continue Lipitor  15.  Hypertension.  Monitor with increased mobility. Reviewed and well controlled Vitals:   01/09/24 2203 01/10/24 0435  BP: 134/65 126/60  Pulse: 79 67  Resp: 19 18  Temp: 98.2 F (36.8 C) 99.2 F (37.3 C)  SpO2: 98% 99%    3/22 BP controlled  16.  Recent cholecystitis.  Status post cholecystostomy tube placement drainage of abscess 12/16.  Follow-up outpatient  17.  Acute diarrhea.    continue imodium, d/c senna  18. 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 Bradycardia: medications reviewed and he is not on any heart rate lowering agents  resolved  20. Poor intake: recommended eating fruits since these are filled with fiber and will bulk his stool  21. Leukocytosis: resolved, continue keflex  22. Decreased urinary output: UA/UC ordered, discussed UA is positive, keflex started, f/u UC, greater than 100,000 Klebsiella pneumoniae,S to keflex, still symptomatic after 3 d abx, suspect bladder spasms will add antispasmodic low dose monitor for retention  23. Generalized weakness:CVA + SCI + debility, increase D3 to 3,000U daily  24. Decreased urination: discussed this could be 2/2 UTI or HD, see below  25. Bladder cramps: resolved, complete course of Bactrim  26. Anemia: continues to drop, stool occult ordered, messaged nursing to please repeat, heparin decreased to daily, recheck CBC with dialysis.   27. Urinary  retention: foley placed by urology, continue until outpatient follow-up, discussed that daughter is a nurse and is accustomed to foley care  28. Hypotension: d/c flomax since foley is now inserted and hypotension could be contributing to weakness, continue tizanidine   3/22 bp's look a little more robust 29. Diabetic retinopathy: will help to schedule outpatient ophthalmology f/u    LOS: 18 days A FACE TO FACE EVALUATION WAS PERFORMED  Philip Benjamin 01/10/2024, 9:20 AM

## 2024-01-10 NOTE — Progress Notes (Signed)
                                                                                                                                                                                                          Palliative Medicine Progress Note   Patient Name: Philip Benjamin       Date: 01/10/2024 DOB: October 16, 1951  Age: 73 y.o. MRN#: [id number] Attending Physician: Philip Chin, MD Primary Care Physician: Philip Peri, MD Admit Date: 12/23/2023  Reason for Consultation/Follow-up: {Reason for Consult:23484}  HPI/Patient Profile: 73 y.o. male  with past medical history of ESRD on HD, diabetes mellitus type 2, HTN, and hyperlipidemia. He presented to [facility] on 12/18/23 right-sided weakness and admitted with acute infarct within the left lentiform nucleus. He was also found to have severe spinal stenosis with C3-C4 cord compression.  He was admitted to CIR on 12/23/2023.    Palliative Medicine was consulted for goals of care.   Subjective: Chart reviewed. Patient assessed at bedside. He has no new complaints.   I spoke with his Daughter/Philip Benjamin by phone.   Objective:  Physical Exam           LBM: Last BM Date : 01/10/24     Palliative Assessment/Data: ***     Palliative Medicine Assessment & Plan   Assessment: Principal Problem:   Left middle cerebral artery stroke [facility]) Active Problems:   Anxiety state    Recommendations/Plan: ***  Goals of Care and Additional Recommendations: Limitations on Scope of Treatment: {Recommended Scope and Preferences:21019}  Code Status:   Prognosis:  {Palliative Care Prognosis:23504}  Discharge Planning: {Palliative dispostion:23505}  Care plan was discussed with ***  Thank you for allowing the Palliative Medicine Team to assist in the care of this patient.   ***   Philip Proud, NP   Please contact Palliative Medicine Team phone at [phone] for questions and concerns.  For individual providers, please see  [facility].

## 2024-01-11 LAB — GLUCOSE, CAPILLARY
Glucose-Capillary: 107 mg/dL — ABNORMAL HIGH (ref 70–99)
Glucose-Capillary: 152 mg/dL — ABNORMAL HIGH (ref 70–99)
Glucose-Capillary: 154 mg/dL — ABNORMAL HIGH (ref 70–99)
Glucose-Capillary: 161 mg/dL — ABNORMAL HIGH (ref 70–99)

## 2024-01-11 LAB — OCCULT BLOOD X 1 CARD TO LAB, STOOL: Fecal Occult Bld: NEGATIVE

## 2024-01-11 MED ORDER — MELATONIN 5 MG PO TABS
5.0000 mg | ORAL_TABLET | Freq: Every evening | ORAL | Status: DC | PRN
  Filled 2024-01-11: qty 1

## 2024-01-11 NOTE — Progress Notes (Signed)
 PROGRESS NOTE   Subjective/Complaints: Pt states he feels "off", tired. Admits to not sleeping much last night. Can't put his finger on as to why. RN reports restlessness as well.   ROS: Patient denies fever, rash, sore throat, blurred vision, dizziness, nausea, vomiting, diarrhea, cough, shortness of breath or chest pain, joint or back/neck pain, headache, or mood change.    Objective:   No results found.  No results for input(s): "WBC", "HGB", "HCT", "PLT" in the last 72 hours.   No results for input(s): "NA", "K", "CL", "CO2", "GLUCOSE", "BUN", "CREATININE", "CALCIUM" in the last 72 hours.    Intake/Output Summary (Last 24 hours) at 01/11/2024 0808 Last data filed at 01/11/2024 0532 Gross per 24 hour  Intake 717 ml  Output 900 ml  Net -183 ml         Physical Exam: Vital Signs Blood pressure 139/72, pulse 73, temperature 98.9 F (37.2 C), temperature source Oral, resp. rate 16, height 6\' 1"  (1.854 m), weight 83.1 kg, SpO2 100%.  Constitutional: No distress . Vital signs reviewed. HEENT: NCAT, EOMI, oral membranes moist Neck: supple Cardiovascular: RRR without murmur. No JVD    Respiratory/Chest: CTA Bilaterally without wheezes or rales. Normal effort    GI/Abdomen: BS +, non-tender, non-distended Ext: no clubbing, cyanosis, or edema Psych: pleasant and cooperative  Neuro -     Comments: RUE- biceps 4+/5, Triceps 4+/5; WE 5-/5; Grip 4/5; FA 4-/5 LUE- Biceps, triceps 5-/5; WE 5-/5; Grip 4+/5; FA 4/5 RLE- HF 4-/5; otherwise 4/5 rest of RLE LLE- 4+/5 throughout-  Skin:    General: Skin is warm and dry.     Comments: R chest HD catheter present  Neurological:     Mental Status: He is oriented to person, place, and time.     Comments: fair insight and awareness. Normal language.  MAS of 2 in RLE- few beats clonus B/L LE's and MAS of 1+ in LLE 3+ DTRs B/L patella, stable 3/23      Assessment/Plan: 1.  Functional deficits which require 3+ hours per day of interdisciplinary therapy in a comprehensive inpatient rehab setting. Physiatrist is providing close team supervision and 24 hour management of active medical problems listed below. Physiatrist and rehab team continue to assess barriers to discharge/monitor patient progress toward functional and medical goals  Care Tool:  Bathing    Body parts bathed by patient: Right arm, Chest, Abdomen, Front perineal area, Right upper leg, Left upper leg, Face, Buttocks, Right lower leg, Left lower leg   Body parts bathed by helper: Left arm Body parts n/a: Left arm, Buttocks, Left lower leg, Right lower leg   Bathing assist Assist Level: Minimal Assistance - Patient > 75%     Upper Body Dressing/Undressing Upper body dressing   What is the patient wearing?: Pull over shirt    Upper body assist Assist Level: Minimal Assistance - Patient > 75%    Lower Body Dressing/Undressing Lower body dressing      What is the patient wearing?: Pants, Underwear/pull up     Lower body assist Assist for lower body dressing: Moderate Assistance - Patient 50 - 74%     Toileting Toileting  Toileting assist Assist for toileting: Moderate Assistance - Patient 50 - 74%     Transfers Chair/bed transfer  Transfers assist     Chair/bed transfer assist level: Minimal Assistance - Patient > 75%     Locomotion Ambulation   Ambulation assist      Assist level: Moderate Assistance - Patient 50 - 74% Assistive device: Walker-rolling Max distance: 54ft   Walk 10 feet activity   Assist     Assist level: Moderate Assistance - Patient - 50 - 74% Assistive device: Walker-rolling   Walk 50 feet activity   Assist Walk 50 feet with 2 turns activity did not occur: Safety/medical concerns         Walk 150 feet activity   Assist Walk 150 feet activity did not occur: Safety/medical concerns         Walk 10 feet on uneven surface   activity   Assist Walk 10 feet on uneven surfaces activity did not occur: Safety/medical concerns         Wheelchair     Assist Is the patient using a wheelchair?: Yes Type of Wheelchair: Manual    Wheelchair assist level: Supervision/Verbal cueing Max wheelchair distance: 150'    Wheelchair 50 feet with 2 turns activity    Assist        Assist Level: Supervision/Verbal cueing   Wheelchair 150 feet activity     Assist      Assist Level: Supervision/Verbal cueing   Blood pressure 139/72, pulse 73, temperature 98.9 F (37.2 C), temperature source Oral, resp. rate 16, height 6\' 1"  (1.854 m), weight 83.1 kg, SpO2 100%.  Medical Problem List and Plan: 1. 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  Bleeding during catheterization/worsening anemia: decrease Heparin to q12H, SCDs ordered             -antiplatelet therapy: Aspirin 81 mg daily 3. Pain Management: Tylenol as needed  3/9- no pain 4. Situational depression/sleep:  Neuropsych consulted. Offered chaplain consult but patient defers. Prozac 20 mg daily, Xanax 0.25 mg 3 times daily as needed  3/23 -pt having difficulty sleeping--will order prn melatonin. Asked him to let his nurse know if he's having problems falling asleep. Avoid excessive daytime naps as well.              -antipsychotic agents: N/A 5. Neuropsych/cognition: This patient is capable of making decisions on his own behalf. 6. Skin/Wound Care: Routine skin checks 7. Fluids/Electrolytes/Nutrition: Routine in and outs with follow-up chemistries 8.  End-stage renal disease.  Hemodialysis per renal services, continue  9.  Type 2 diabetes mellitus, hemoglobin A1c 5.6. Decrease Lantus to 20U.  D/c feeding supplement  CBG (last 3)  Recent Labs    01/10/24 1636  01/10/24 2059 01/11/24 0640  GLUCAP 153* 192* 107*   3/23 CBG's a little more consistent yesterday   -appears to have eaten well yesterday   -continue with lantus 20 u 10.  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  Discussed with Dr. Wynetta Emery neurosurgery placed on trial of steroids.  No current intervention at this time.  Needs to f/u with NSU after d/c.   11.  Anemia of chronic disease.  Follow-up CBC  12.  Glaucoma.  Legally blind left eye.  Continue eyedrops  13.  BPH. Continue Flomax 0.4 mg daily  14.  Hyperlipidemia.  Continue Lipitor  15.  Hypertension.  Monitor with increased mobility. Reviewed and well controlled Vitals:   01/10/24 1409 01/10/24 1954  BP: (!) 128/58 139/72  Pulse: 63 73  Resp: 17 16  Temp: 98 F (36.7 C) 98.9 F (37.2 C)  SpO2: 100% 100%    3/23 BP controlled  16.  Recent cholecystitis.  Status post cholecystostomy tube placement drainage of abscess 12/16.  Follow-up outpatient  17.  Acute diarrhea.    continue imodium, d/c senna  18. 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 Doesn't appear sedated but if his fatigue doesn't improve with better sleep, may need to decrease further 19. Bradycardia: medications reviewed and he is not on any heart rate lowering agents  resolved  20. Poor intake: recommended eating fruits since these are filled with fiber and will bulk his stool  21. Leukocytosis: resolved, continue keflex  22. Decreased urinary output: UA/UC ordered, discussed UA is positive, keflex started, f/u UC, greater than 100,000 Klebsiella pneumoniae,S to keflex, still symptomatic after 3 d abx, suspect bladder spasms will add antispasmodic low dose monitor for retention  23. Generalized weakness:CVA + SCI + debility, increase D3 to 3,000U daily  24. Decreased urination: discussed this could be 2/2 UTI or HD, see below  25. Bladder cramps: resolved,  complete course of Bactrim  26. Anemia: continues to drop, stool occult ordered, messaged nursing to please repeat, heparin decreased to daily, recheck CBC with dialysis.   27. Urinary retention: foley placed by urology, continue until outpatient follow-up, discussed that daughter is a nurse and is accustomed to foley care  28. Hypotension: d/c flomax since foley is now inserted and hypotension could be contributing to weakness, continue tizanidine   3/22 bp's look a little more robust 29. Diabetic retinopathy: will help to schedule outpatient ophthalmology f/u    LOS: 19 days A FACE TO FACE EVALUATION WAS PERFORMED  Ranelle Oyster 01/11/2024, 8:08 AM

## 2024-01-11 NOTE — Progress Notes (Signed)
 Occupational Therapy Session Note  Patient Details  Name: KASHTEN GOWIN MRN: [id number] Date of Birth: 18-Jun-1951  Today's Date: 01/11/2024 OT Individual Time: 1350-1425 OT Individual Time Calculation (min): 35 min   Short Term Goals: Week 3:  OT Short Term Goal 1 (Week 3): STG= Recently downgraded LTG due to continued medical concerns and physical decline.  Skilled Therapeutic Interventions/Progress Updates:  Pt received sitting in [facility], family present, urgent need to void. Pt dependently transported into bathroom, stand-pivot from WC<>BSC/toilet with Min A + use of grab bar. Pt requires Max A for 3/3 toileting activities. X2 rounds due to urgency. Pt requires Max A for adjustment of LB clothing to properly position foley tubing. Pt remained sitting on BSC/toilet, care passed to NT/RN.  Therapy Documentation Precautions:  Precautions Precautions: Fall Precaution/Restrictions Comments: R hemi Restrictions Weight Bearing Restrictions Per Provider Order: No Other Position/Activity Restrictions: r hemiparesis   Therapy/Group: Individual Therapy  Lou Cal, OTR/L, MSOT  01/11/2024, 6:29 AM

## 2024-01-11 NOTE — Plan of Care (Signed)
 Pt is progressing

## 2024-01-11 NOTE — Progress Notes (Signed)
  [facility] Progress Note   Subjective:    Seen in room. Getting dressed with therapy.    Objective Vitals:   01/10/24 0500 01/10/24 1409 01/10/24 1954 01/11/24 0552  BP:  (!) 128/58 139/72   Pulse:  63 73   Resp:  17 16   Temp:  98 F (36.7 C) 98.9 F (37.2 C)   TempSrc:  Oral Oral   SpO2:  100% 100%   Weight: 84.5 kg   83.1 kg  Height:       Physical Exam General: Elderly man, nad  Heart: normal rate Lungs: Bilateral chest rise  ABD: soft and non-tender GU: foley cath in place: clear yellow urine Extremities: no edema, warm and well-perfused Dialysis Access: [facility]  Filed Weights   01/09/24 0500 01/10/24 0500 01/11/24 0552  Weight: 86.2 kg 84.5 kg 83.1 kg    Intake/Output Summary (Last 24 hours) at 01/11/2024 1310 Last data filed at 01/11/2024 0900 Gross per 24 hour  Intake 837 ml  Output 700 ml  Net 137 ml    Additional Objective Labs: Basic Metabolic Panel: Recent Labs  Lab 01/05/24 1109 01/07/24 0503 01/08/24 0549  NA 129* 130* 132*  K 5.0 4.0 4.0  CL 95* 94* 96*  CO2 21* 27 26  GLUCOSE 152* 101* 104*  BUN 44* 36* 21  CREATININE 3.59* 3.22* 2.54*  CALCIUM 8.9 8.9 8.5*  PHOS 4.3 4.0 3.1   Liver Function Tests: Recent Labs  Lab 01/05/24 1109 01/07/24 0503 01/08/24 0549  ALBUMIN 2.9* 2.6* 2.5*   No results for input(s): "LIPASE", "AMYLASE" in the last 168 hours. CBC: Recent Labs  Lab 01/05/24 1109 01/06/24 0458 01/07/24 0503 01/08/24 0549  WBC 8.3 7.3 7.6 8.7  NEUTROABS  --  4.9 5.0 6.8  HGB 9.3* 8.5* 8.3* 8.0*  HCT 28.7* 25.9* 24.8* 24.2*  MCV 94.1 92.2 92.5 92.7  PLT 193 176 175 172   Blood Culture    Component Value Date/Time   SDES URINE, CLEAN CATCH 01/01/2024 1022   SPECREQUEST  01/01/2024 1022    NONE Performed at [facility], [address] [place], [place] [address]    CULT >=100,000 COLONIES/mL KLEBSIELLA PNEUMONIAE (A) 01/01/2024 1022   REPTSTATUS 01/03/2024 FINAL 01/01/2024 1022     Cardiac Enzymes: No results for input(s): "CKTOTAL", "CKMB", "CKMBINDEX", "TROPONINI" in the last 168 hours. CBG: Recent Labs  Lab 01/10/24 1151 01/10/24 1636 01/10/24 2059 01/11/24 0640 01/11/24 1108  GLUCAP 129* 153* 192* 107* 152*   Iron Studies: No results for input(s): "IRON", "TIBC", "TRANSFERRIN", "FERRITIN" in the last 72 hours. Lab Results  Component Value Date   INR 1.2 10/05/2023   Studies/Results: No results found.  Medications:   aspirin  81 mg Oral Daily   atorvastatin  40 mg Oral Daily   Chlorhexidine Gluconate Cloth  6 each Topical Q12H   cholecalciferol  3,000 Units Oral Daily   cycloSPORINE  1 drop Both Eyes BID   darbepoetin (ARANESP) injection - DIALYSIS  60 mcg Subcutaneous Q Thu-1800   dorzolamide  1 drop Both Eyes BID   And   timolol  1 drop Both Eyes BID   fentaNYL (SUBLIMAZE) injection  50 mcg Intravenous Once   FLUoxetine  20 mg Oral Daily   heparin  5,000 Units Subcutaneous Daily   insulin glargine  19 Units Subcutaneous Daily   latanoprost  1 drop Left Eye QHS   midodrine  5 mg Oral Q M,W,F-HD   multivitamin  1 tablet  Oral QHS   tiZANidine  2 mg Oral BID    Dialysis Orders: MWF - [facility] 4hr, 350/500, EDW 86.5kg, 2K/2.5Ca, TDC, heparin 1000U/hr - Venofer 50mg  IV q Monday - Mircera IV q 4 weeks (last 2/17)  Assessment/Plan: ESRD: Continue HD on MWF schedule. 1st stage BB AVF creation with VVS on 3/14.   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 Hx cholecystitis 09/2023: S/p IR chole tube, now removed. Urinary retention/bladder spasms - s/p Foley. Per urology.   HTN/volume: Blood pressures on the soft side. Midodrine added here  Anemia of ESRD: Hgb 8.0. iron replete, on esa. Aranesp 60q q Thur -last on 3/20. Increase dose next week if stlil here  Secondary HPTH: Ca/Phos ok - not on binders, on Vit D3. Nutrition: Alb low,  continue protein supplements. Diarrhea: per primary, seems improved T2DM Dispo: Pending discharge date 3/27   Philip Blase PA-C [facility] 01/11/2024,1:10 PM

## 2024-01-11 NOTE — Progress Notes (Signed)
 Restless night without specific complaint of. Several smears of stool. LBM-03/22. Foley in place. BLE edema, right > left. Teds removed at HS. Refused SCD's. Philip Benjamin A

## 2024-01-12 LAB — RENAL FUNCTION PANEL
Albumin: 2.6 g/dL — ABNORMAL LOW (ref 3.5–5.0)
Anion gap: 12 (ref 5–15)
BUN: 31 mg/dL — ABNORMAL HIGH (ref 8–23)
CO2: 21 mmol/L — ABNORMAL LOW (ref 22–32)
Calcium: 9 mg/dL (ref 8.9–10.3)
Chloride: 101 mmol/L (ref 98–111)
Creatinine, Ser: 3.47 mg/dL — ABNORMAL HIGH (ref 0.61–1.24)
GFR, Estimated: 18 mL/min — ABNORMAL LOW (ref >60)
Glucose, Bld: 64 mg/dL — ABNORMAL LOW (ref 70–99)
Phosphorus: 3.7 mg/dL (ref 2.5–4.6)
Potassium: 4 mmol/L (ref 3.5–5.1)
Sodium: 134 mmol/L — ABNORMAL LOW (ref 135–145)

## 2024-01-12 LAB — CBC
HCT: 27.5 % — ABNORMAL LOW (ref 39.0–52.0)
Hemoglobin: 8.9 g/dL — ABNORMAL LOW (ref 13.0–17.0)
MCH: 30.1 pg (ref 26.0–34.0)
MCHC: 32.4 g/dL (ref 30.0–36.0)
MCV: 92.9 fL (ref 80.0–100.0)
Platelets: 192 10*3/uL (ref 150–400)
RBC: 2.96 MIL/uL — ABNORMAL LOW (ref 4.22–5.81)
RDW: 12 % (ref 11.5–15.5)
WBC: 7.2 10*3/uL (ref 4.0–10.5)
nRBC: 0 % (ref 0.0–0.2)

## 2024-01-12 LAB — GLUCOSE, CAPILLARY
Glucose-Capillary: 47 mg/dL — ABNORMAL LOW (ref 70–99)
Glucose-Capillary: 49 mg/dL — ABNORMAL LOW (ref 70–99)
Glucose-Capillary: 61 mg/dL — ABNORMAL LOW (ref 70–99)
Glucose-Capillary: 66 mg/dL — ABNORMAL LOW (ref 70–99)
Glucose-Capillary: 79 mg/dL (ref 70–99)
Glucose-Capillary: 123 mg/dL — ABNORMAL HIGH (ref 70–99)
Glucose-Capillary: 97 mg/dL (ref 70–99)
Glucose-Capillary: 124 mg/dL — ABNORMAL HIGH (ref 70–99)

## 2024-01-12 MED ORDER — INSULIN GLARGINE-YFGN 100 UNIT/ML ~~LOC~~ SOLN
14.0000 [IU] | Freq: Every day | SUBCUTANEOUS | Status: DC
  Administered 2024-01-13: 14 [IU] via SUBCUTANEOUS
  Filled 2024-01-12: qty 0.14

## 2024-01-12 MED ORDER — HEPARIN SODIUM (PORCINE) 1000 UNIT/ML DIALYSIS
1000.0000 [IU] | INTRAMUSCULAR | Status: DC | PRN
  Administered 2024-01-12: 1000 [IU]

## 2024-01-12 MED ORDER — INSULIN GLARGINE 100 UNIT/ML ~~LOC~~ SOLN
14.0000 [IU] | Freq: Every day | SUBCUTANEOUS | Status: DC
  Filled 2024-01-12: qty 0.14

## 2024-01-12 NOTE — Progress Notes (Signed)
 Physical Therapy Session Note  Patient Details  Name: Philip Benjamin MRN: [id number] Date of Birth: 1951-08-07  Today's Date: 01/12/2024 PT Individual Time: 1045-1158 PT Individual Time Calculation (min): 73 min   Short Term Goals: Week 3:  PT Short Term Goal 1 (Week 3): STG = LTG due to ELOS   Skilled Therapeutic Interventions/Progress Updates:       Pt sitting in wheelchair to start - reports frustration re: progressive loss of vision 2/2 dialysis. He denies any pains or aches.   Transported to main rehab gym for time management. Continued to work on stair navigation to prepare for DC planning. Pt completed 2x4 stairs (6") using 2 hand rails. Today, he required modA overall for ascent and descent (was minA last week). Increased assist needed due to RLE spasticity and weakness, difficulty with foot placement and tends to overcrowd or even step on his other foot. Requires external assist for advancing RLE up to the next step with decreased awareness of having his full foot on the step.   Completed lateral stepping L<>R 4x73ft with seated rest breaks with minA and BUE support to // bar. Requires external assist for lateral stepping to his R due to difficulty picking up - again his spasticity limiting. Worked on lateral weight shifting to his L to help offload his RLE during stepping which did help some.   Completed nustep for x15 minutes at L5 resistance with setupA needed for his BLE. Encouraged full AROM and keeping cadence > 35 spm. Achieved 530 steps in total.   Returned to his room at end of session and left sitting upright with his needs met.   Therapy Documentation Precautions:  Precautions Precautions: Fall Precaution/Restrictions Comments: R hemi Restrictions Weight Bearing Restrictions Per Provider Order: No Other Position/Activity Restrictions: r hemiparesis General:    Therapy/Group: Individual Therapy  [name] 01/12/2024, 7:46 AM

## 2024-01-12 NOTE — Progress Notes (Signed)
 Received patient in bed to unit.  Alert and oriented.  Informed consent signed and in chart.   TX duration:3  Patient tolerated well.  Transported back to the room  Alert, without acute distress.  Hand-off given to patient's nurse.   Access used: Right chest [facility] Access issues: None  Total UF removed: 1.3 Medication(s) given: See [facility]   01/12/24 1807  Vitals  Temp 97.8 F (36.6 C)  Pulse Rate 72  Resp 13  BP (!) 108/56  SpO2 99 %  O2 Device Room Air  Oxygen Therapy  Patient Activity (if Appropriate) In bed  Pulse Oximetry Type Continuous  Oximetry Probe Site Changed No  During Treatment Monitoring  Dialysate Potassium Concentration 3  Dialysate Calcium Concentration 2.5  HD Safety Checks Performed Yes  Intra-Hemodialysis Comments Tolerated well;Tx completed

## 2024-01-12 NOTE — Plan of Care (Signed)
  Problem: Consults Goal: RH STROKE PATIENT EDUCATION Description: See Patient Education module for education specifics  Outcome: Progressing   Problem: RH BOWEL ELIMINATION Goal: RH STG MANAGE BOWEL WITH ASSISTANCE Description: STG Manage Bowel with mod I Assistance. Outcome: Progressing   Problem: RH BLADDER ELIMINATION Goal: RH STG MANAGE BLADDER WITH ASSISTANCE Description: STG Manage Bladder With min Assistance Outcome: Progressing   Problem: RH SAFETY Goal: RH STG ADHERE TO SAFETY PRECAUTIONS W/ASSISTANCE/DEVICE Description: STG Adhere to Safety Precautions With cues  Assistance/Device. Outcome: Progressing   Problem: RH PAIN MANAGEMENT Goal: RH STG PAIN MANAGED AT OR BELOW PT'S PAIN GOAL Description: < 4 with prns Outcome: Progressing   Problem: RH KNOWLEDGE DEFICIT Goal: RH STG INCREASE KNOWLEDGE OF DIABETES Description: Patient and family will be able to manage DM using educational resources for medications and dietary modification independently Outcome: Progressing Goal: RH STG INCREASE KNOWLEDGE OF HYPERTENSION Description: Patient and family will be able to manage HTN using educational resources for medications and dietary modification independently Outcome: Progressing Goal: RH STG INCREASE KNOWLEGDE OF HYPERLIPIDEMIA Description: Patient and family will be able to manage HLD using educational resources for medications and dietary modification independently Outcome: Progressing Goal: RH STG INCREASE KNOWLEDGE OF STROKE PROPHYLAXIS Description: Patient and family will be able to manage secondary risks using educational resources for medications and dietary modification independently Outcome: Progressing

## 2024-01-12 NOTE — Progress Notes (Signed)
 Occupational Therapy Session Note  Patient Details  Name: RYON LAYTON MRN: [id number] Date of Birth: June 22, 1951  Today's Date: 01/12/2024 OT Individual Time: 0918-1000 OT Individual Time Calculation (min): 42 min    Short Term Goals: Week 3:  OT Short Term Goal 1 (Week 3): STG= Recently downgraded LTG due to continued medical concerns and physical decline.  Skilled Therapeutic Interventions/Progress Updates:   Patient received seated in wheelchair reporting not feeling well - upset stomach and diarrhea.  Patient reports bathing and putting on clean clothes yesterday afternoon so no need to bathe today.  Transported patient to tub room to review and practice tub transfer bench transfers.  Patient has a tub bench at home - demonstrated then practiced this transfer with use of grab bar.  Recommend patient obtain a grab bar once able to shower - e.g. when HD using fistula versus chest port.  Transported patient back to room to complete grooming.  Assisted patient in donning TED hose - RLE with increased edema compared to LLE.  Provided gauze padding to Right second toe to protect skin.  Left up in wheelchair with chair pad alarm in place and engaged.   Therapy Documentation Precautions:  Precautions Precautions: Fall Precaution/Restrictions Comments: R hemi Restrictions Weight Bearing Restrictions Per Provider Order: No Other Position/Activity Restrictions: r hemiparesis   Pain:  Denies pain     Therapy/Group: Individual Therapy  Collier Salina 01/12/2024, 9:59 AM

## 2024-01-12 NOTE — Progress Notes (Signed)
 Hypoglycemic Event  CBG: 47  Treatment: 4 oz juice/soda  Symptoms: None  Follow-up CBG: [id number] CBG Result:61  Possible Reasons for Event: Unknown  Comments/MD notified:treated per protocol    Alfredo Martinez A

## 2024-01-12 NOTE — Progress Notes (Signed)
 Physical Therapy Session Note  Patient Details  Name: Philip Benjamin MRN: [id number] Date of Birth: 21-Jan-1951  Today's Date: 01/12/2024 PT Individual Time: 0805-0915 PT Individual Time Calculation (min): 70 min   Short Term Goals: Week 3:  PT Short Term Goal 1 (Week 3): STG = LTG due to ELOS  Skilled Therapeutic Interventions/Progress Updates:    Pt presents in room seated EOB, pt denies pain and motivated to participate with therapy. Session focused on therapeutic exercise for BLE strengthening, WC mobility training, and transfer training.  Pt completes squat pivot transfer from EOB to [facility] with min assist, declines use of RW due to pt reporting not very comfortable with RW yet. Pt then completes WC mobility training 50' using BUE/BLE to promote strengthening and as warm up prior to therex and transfer training, pt requires supervision only for WC propulsion, propels very slowly.  Pt transported to day room for time management and energy conservation. Pt completes seated therex for BLE strengthening x20 seated marches with cue for holding muscle contraction at end range hip flexion with pt fatiguing quickly on RLE.   Pt then completes transfer training x5 sit<>stands as warm up for stand pivot transfer training, pt requires CGA/min assist for sit<>stands to RW from [facility]. Pt then completes x8 stand pivot transfers WC to mat with RW with CGA/min assist, improved ability to position RLE with repetition.  Pt completes gait training x20' with min assist RW and 2nd person WC follow with pt demonstrating good foot clearance bilaterally initially however RLE quickly fatigues with pt unable to advance at end of 20' gait distance. Pt demonstrates BLE knee hyperextension in stance phase however does demonstrate one instance of RLE knee flexion in stance no buckle but does demonstrate increased postural sway.  Pt then completes standing (with RW) and seated therex to promote BLE strengthening and  endurance including: - standing marches x10 BLE (CGA for task, pt unable to clear RLE for last 3 reps due to fatigue) - seated alternating marches x20 - standing mini squats x10 - seated DF x20 - standing heel raise x15  Pt provided with seated rest breaks between all exercises to promote energy conservation and quality with tasks.  Pt returns to room and remains seated in [facility] with all needs within reach, cal light in place at end of session.   Therapy Documentation Precautions:  Precautions Precautions: Fall Precaution/Restrictions Comments: R hemi Restrictions Weight Bearing Restrictions Per Provider Order: No Other Position/Activity Restrictions: r hemiparesis   Therapy/Group: Individual Therapy  Edwin Cap PT, DPT 01/12/2024, 9:18 AM

## 2024-01-12 NOTE — Progress Notes (Signed)
 Patient ID: KEIONDRE COLEE, male   DOB: 13-Oct-1951, 73 y.o.   MRN: [id number] S: no new complaints O:BP 132/60   Pulse (!) 58   Temp 98.6 F (37 C) (Oral)   Resp 16   Ht 6\' 1"  (1.854 m)   Wt 84.5 kg   SpO2 100%   BMI 24.58 kg/m   Intake/Output Summary (Last 24 hours) at 01/12/2024 1022 Last data filed at 01/12/2024 0947 Gross per 24 hour  Intake 1176 ml  Output 750 ml  Net 426 ml   Intake/Output: I/O last 3 completed shifts: In: 1080 [P.O.:1080] Out: 1450 [Urine:1450]  Intake/Output this shift:  Total I/O In: 456 [P.O.:456] Out: -  Weight change: 1.4 kg Gen: NAD CVS: RRR Resp:CTA Abd: +BS, soft, NT/ND Ext: no edema, LUE AVF +T/B  Recent Labs  Lab 01/05/24 1109 01/07/24 0503 01/08/24 0549 01/12/24 0530  NA 129* 130* 132* 134*  K 5.0 4.0 4.0 4.0  CL 95* 94* 96* 101  CO2 21* 27 26 21*  GLUCOSE 152* 101* 104* 64*  BUN 44* 36* 21 31*  CREATININE 3.59* 3.22* 2.54* 3.47*  ALBUMIN 2.9* 2.6* 2.5* 2.6*  CALCIUM 8.9 8.9 8.5* 9.0  PHOS 4.3 4.0 3.1 3.7   Liver Function Tests: Recent Labs  Lab 01/07/24 0503 01/08/24 0549 01/12/24 0530  ALBUMIN 2.6* 2.5* 2.6*   No results for input(s): "LIPASE", "AMYLASE" in the last 168 hours. No results for input(s): "AMMONIA" in the last 168 hours. CBC: Recent Labs  Lab 01/05/24 1109 01/06/24 0458 01/07/24 0503 01/08/24 0549 01/12/24 0530  WBC 8.3 7.3 7.6 8.7 7.2  NEUTROABS  --  4.9 5.0 6.8  --   HGB 9.3* 8.5* 8.3* 8.0* 8.9*  HCT 28.7* 25.9* 24.8* 24.2* 27.5*  MCV 94.1 92.2 92.5 92.7 92.9  PLT 193 176 175 172 192   Cardiac Enzymes: No results for input(s): "CKTOTAL", "CKMB", "CKMBINDEX", "TROPONINI" in the last 168 hours. CBG: Recent Labs  Lab 01/12/24 0633 01/12/24 0646 01/12/24 0654 01/12/24 0700 01/12/24 0704  GLUCAP 49* 47* 61* 66* 79    Iron Studies: No results for input(s): "IRON", "TIBC", "TRANSFERRIN", "FERRITIN" in the last 72 hours. Studies/Results: No results found.  aspirin  81 mg Oral Daily    atorvastatin  40 mg Oral Daily   Chlorhexidine Gluconate Cloth  6 each Topical Q12H   cholecalciferol  3,000 Units Oral Daily   cycloSPORINE  1 drop Both Eyes BID   darbepoetin (ARANESP) injection - DIALYSIS  60 mcg Subcutaneous Q Thu-1800   dorzolamide  1 drop Both Eyes BID   And   timolol  1 drop Both Eyes BID   fentaNYL (SUBLIMAZE) injection  50 mcg Intravenous Once   FLUoxetine  20 mg Oral Daily   heparin  5,000 Units Subcutaneous Daily   [START ON 01/13/2024] insulin glargine  14 Units Subcutaneous Daily   latanoprost  1 drop Left Eye QHS   midodrine  5 mg Oral Q M,W,F-HD   multivitamin  1 tablet Oral QHS   tiZANidine  2 mg Oral BID    BMET    Component Value Date/Time   NA 134 (L) 01/12/2024 0530   K 4.0 01/12/2024 0530   CL 101 01/12/2024 0530   CO2 21 (L) 01/12/2024 0530   GLUCOSE 64 (L) 01/12/2024 0530   BUN 31 (H) 01/12/2024 0530   CREATININE 3.47 (H) 01/12/2024 0530   CALCIUM 9.0 01/12/2024 0530   GFRNONAA 18 (L) 01/12/2024 0530   GFRAA 44 (L)  01/14/2018 1112   CBC    Component Value Date/Time   WBC 7.2 01/12/2024 0530   RBC 2.96 (L) 01/12/2024 0530   HGB 8.9 (L) 01/12/2024 0530   HCT 27.5 (L) 01/12/2024 0530   PLT 192 01/12/2024 0530   MCV 92.9 01/12/2024 0530   MCH 30.1 01/12/2024 0530   MCHC 32.4 01/12/2024 0530   RDW 12.0 01/12/2024 0530   LYMPHSABS 0.8 01/08/2024 0549   MONOABS 1.0 01/08/2024 0549   EOSABS 0.2 01/08/2024 0549   BASOSABS 0.0 01/08/2024 0549    Dialysis Orders: MWF - [facility] 4hr, 350/500, EDW 86.5kg, 2K/2.5Ca, TDC, heparin 1000U/hr - Venofer 50mg  IV q Monday - Mircera IV q 4 weeks (last 2/17)   Assessment/Plan: ESRD: Continue HD on MWF schedule. 1st stage BB AVF creation with VVS on 3/14.   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 Hx cholecystitis 09/2023: S/p IR chole tube, now removed. Urinary retention/bladder  spasms - s/p Foley. Per urology.   HTN/volume: Blood pressures on the soft side. Midodrine added here  Anemia of ESRD: Hgb 8.9. iron replete, on esa. Aranesp 60q q Thur -last on 3/20. Increase dose next week if stlil here  Secondary HPTH: Ca/Phos ok - not on binders, on Vit D3. Nutrition: Alb low, continue protein supplements. Diarrhea: per primary, seems improved T2DM Dispo: Pending discharge date 3/27   Irena Cords, MD [facility]

## 2024-01-12 NOTE — Progress Notes (Signed)
 Sat in wheelchair until 2300. PRN xanax given at 2321, per patient's request. Complained of feeling anxious. Left AVG-++. HD cath in place to right chest, small amount of bruising around site. Moderate mushy BM at start of shift. Imodium given 2 times on previous shift. BLE edema, right>left. Foley patent. Philip Benjamin A

## 2024-01-12 NOTE — Progress Notes (Signed)
 Hypoglycemic Event  CBG: 49  Treatment: 4 oz juice/soda  Symptoms: None  Follow-up CBG: Time:0646 CBG Result:47  Possible Reasons for Event: Unknown  Comments/MD notified:treated per protocol. Refused HS snack    Philip Benjamin A

## 2024-01-12 NOTE — Progress Notes (Signed)
 Hypoglycemic Event  CBG: 66  Treatment: 4 oz juice/soda  Symptoms: None  Follow-up CBG: Time:0704 CBG Result:79  Possible Reasons for Event: Unknown  Comments/MD notified:treated per protocol    Philip Benjamin A

## 2024-01-12 NOTE — Progress Notes (Signed)
 Patient has left for to dialysis. Report has been given and chart is with the patient. Cletis Media, RN

## 2024-01-12 NOTE — Progress Notes (Signed)
 PROGRESS NOTE   Subjective/Complaints: Continues to have diarrhea, advised that he can take an imodium this morning Discussed hypoglycemia this morning, decreasing insulin to 14U  ROS: +diarrhea   Objective:   No results found.  Recent Labs    01/12/24 0530  WBC 7.2  HGB 8.9*  HCT 27.5*  PLT 192     Recent Labs    01/12/24 0530  NA 134*  K 4.0  CL 101  CO2 21*  GLUCOSE 64*  BUN 31*  CREATININE 3.47*  CALCIUM 9.0      Intake/Output Summary (Last 24 hours) at 01/12/2024 1016 Last data filed at 01/12/2024 0947 Gross per 24 hour  Intake 1176 ml  Output 750 ml  Net 426 ml         Physical Exam: Vital Signs Blood pressure 132/60, pulse (!) 58, temperature 98.6 F (37 C), temperature source Oral, resp. rate 16, height 6\' 1"  (1.854 m), weight 84.5 kg, SpO2 100%.  Constitutional: No distress . Vital signs reviewed. HEENT: NCAT, EOMI, oral membranes moist Neck: supple Cardiovascular: Bradycardia Respiratory/Chest: CTA Bilaterally without wheezes or rales. Normal effort    GI/Abdomen: BS +, non-tender, non-distended Ext: no clubbing, cyanosis, or edema Psych: pleasant and cooperative  Neuro -     Comments: RUE- biceps 4+/5, Triceps 4+/5; WE 5-/5; Grip 4/5; FA 4-/5 LUE- Biceps, triceps 5-/5; WE 5-/5; Grip 4+/5; FA 4/5 RLE- HF 4-/5; otherwise 4/5 rest of RLE LLE- 4+/5 throughout-  Skin:    General: Skin is warm and dry.     Comments: R chest HD catheter present  Neurological:     Mental Status: He is oriented to person, place, and time.     Comments: fair insight and awareness. Normal language.  MAS of 2 in RLE- few beats clonus B/L LE's and MAS of 1+ in LLE 3+ DTRs B/L patella, stable 3/23      Assessment/Plan: 1. Functional deficits which require 3+ hours per day of interdisciplinary therapy in a comprehensive inpatient rehab setting. Physiatrist is providing close team supervision and 24  hour management of active medical problems listed below. Physiatrist and rehab team continue to assess barriers to discharge/monitor patient progress toward functional and medical goals  Care Tool:  Bathing    Body parts bathed by patient: Right arm, Chest, Abdomen, Front perineal area, Right upper leg, Left upper leg, Face, Buttocks, Right lower leg, Left lower leg   Body parts bathed by helper: Left arm Body parts n/a: Left arm, Buttocks, Left lower leg, Right lower leg   Bathing assist Assist Level: Minimal Assistance - Patient > 75%     Upper Body Dressing/Undressing Upper body dressing   What is the patient wearing?: Pull over shirt    Upper body assist Assist Level: Minimal Assistance - Patient > 75%    Lower Body Dressing/Undressing Lower body dressing      What is the patient wearing?: Pants, Underwear/pull up     Lower body assist Assist for lower body dressing: Moderate Assistance - Patient 50 - 74%     Toileting Toileting    Toileting assist Assist for toileting: Moderate Assistance - Patient 50 - 74%  Transfers Chair/bed transfer  Transfers assist     Chair/bed transfer assist level: Minimal Assistance - Patient > 75%     Locomotion Ambulation   Ambulation assist      Assist level: Moderate Assistance - Patient 50 - 74% Assistive device: Walker-rolling Max distance: 73ft   Walk 10 feet activity   Assist     Assist level: Moderate Assistance - Patient - 50 - 74% Assistive device: Walker-rolling   Walk 50 feet activity   Assist Walk 50 feet with 2 turns activity did not occur: Safety/medical concerns         Walk 150 feet activity   Assist Walk 150 feet activity did not occur: Safety/medical concerns         Walk 10 feet on uneven surface  activity   Assist Walk 10 feet on uneven surfaces activity did not occur: Safety/medical concerns         Wheelchair     Assist Is the patient using a wheelchair?:  Yes Type of Wheelchair: Manual    Wheelchair assist level: Supervision/Verbal cueing Max wheelchair distance: 150'    Wheelchair 50 feet with 2 turns activity    Assist        Assist Level: Supervision/Verbal cueing   Wheelchair 150 feet activity     Assist      Assist Level: Supervision/Verbal cueing   Blood pressure 132/60, pulse (!) 58, temperature 98.6 F (37 C), temperature source Oral, resp. rate 16, height 6\' 1"  (1.854 m), weight 84.5 kg, SpO2 100%.  Medical Problem List and Plan: 1. 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  Bleeding during catheterization/worsening anemia: decrease Heparin to q12H, SCDs ordered             -antiplatelet therapy: Aspirin 81 mg daily 3. Pain Management: Tylenol as needed  3/9- no pain 4. Situational depression/sleep:  Neuropsych consulted. Offered chaplain consult but patient defers. Prozac 20 mg daily, Xanax 0.25 mg 3 times daily as needed  3/23 -pt having difficulty sleeping--will order prn melatonin. Asked him to let his nurse know if he's having problems falling asleep. Avoid excessive daytime naps as well.              -antipsychotic agents: N/A 5. Neuropsych/cognition: This patient is capable of making decisions on his own behalf. 6. Skin/Wound Care: Routine skin checks 7. Fluids/Electrolytes/Nutrition: Routine in and outs with follow-up chemistries 8.  End-stage renal disease.  Hemodialysis per renal services, continue  9.  Type 2 diabetes mellitus, hemoglobin A1c 5.6. Decrease lantus to 14U.  D/c feeding supplement  CBG (last 3)  Recent Labs    01/12/24 0654 01/12/24 0700 01/12/24 0704  GLUCAP 61* 66* 79    10.  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  Discussed with Dr. Wynetta Emery neurosurgery placed on trial of  steroids.  No current intervention at this time.  Needs to f/u with NSU after d/c.   11.  Anemia of chronic disease.  Follow-up CBC  12.  Glaucoma.  Legally blind left eye.  Continue eyedrops  13.  BPH. Continue Flomax 0.4 mg daily  14.  Hyperlipidemia.  Continue Lipitor  15.  Hypertension.  Monitor  with increased mobility. Reviewed and well controlled Vitals:   01/11/24 1948 01/12/24 0542  BP: 127/62 132/60  Pulse: 67 (!) 58  Resp: 16 16  Temp: 98.7 F (37.1 C) 98.6 F (37 C)  SpO2: 100% 100%    3/23 BP controlled  16.  Recent cholecystitis.  Status post cholecystostomy tube placement drainage of abscess 12/16.  Follow-up outpatient  17.  Acute diarrhea.    continue imodium, d/c senna  18. 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 Doesn't appear sedated but if his fatigue doesn't improve with better sleep, may need to decrease further 19. Bradycardia: medications reviewed and he is not on any heart rate lowering agents  resolved  20. Poor intake: recommended eating fruits since these are filled with fiber and will bulk his stool  21. Leukocytosis: resolved, continue keflex  22. Decreased urinary output: UA/UC ordered, discussed UA is positive, keflex started, f/u UC, greater than 100,000 Klebsiella pneumoniae,S to keflex, still symptomatic after 3 d abx, suspect bladder spasms will add antispasmodic low dose monitor for retention  23. Generalized weakness:CVA + SCI + debility, increase D3 to 3,000U daily  24. Decreased urination: discussed this could be 2/2 UTI or HD, see below  25. Bladder cramps: resolved, complete course of Bactrim  26. Anemia: Hgb reviewed and has improved  27. Urinary retention: foley placed by urology, continue until outpatient follow-up, discussed that daughter is a nurse and is accustomed to foley care, discussed that he should have f/u with urology 1 week after  discharge  28. Hypotension: d/c flomax since foley is now inserted and hypotension could be contributing to weakness, continue tizanidine, discussed with patient.   29. Diabetic retinopathy: will help to schedule outpatient ophthalmology f/u    LOS: 20 days A FACE TO FACE EVALUATION WAS PERFORMED  Clint Bolder P Jiali Linney 01/12/2024, 10:16 AM

## 2024-01-13 LAB — GLUCOSE, CAPILLARY
Glucose-Capillary: 83 mg/dL (ref 70–99)
Glucose-Capillary: 167 mg/dL — ABNORMAL HIGH (ref 70–99)
Glucose-Capillary: 108 mg/dL — ABNORMAL HIGH (ref 70–99)
Glucose-Capillary: 113 mg/dL — ABNORMAL HIGH (ref 70–99)

## 2024-01-13 MED ORDER — INSULIN GLARGINE-YFGN 100 UNIT/ML ~~LOC~~ SOLN
9.0000 [IU] | Freq: Every day | SUBCUTANEOUS | Status: DC
  Administered 2024-01-14: 9 [IU] via SUBCUTANEOUS
  Filled 2024-01-13: qty 0.09

## 2024-01-13 MED ORDER — TIZANIDINE HCL 4 MG PO TABS
2.0000 mg | ORAL_TABLET | Freq: Every day | ORAL | Status: DC
  Administered 2024-01-14: 2 mg via ORAL
  Filled 2024-01-13: qty 1

## 2024-01-13 NOTE — Progress Notes (Signed)
 Patient ID: Philip Benjamin, male   DOB: 06-29-1951, 73 y.o.   MRN: [id number] S: Felt lightheaded earlier this morning after working with PT. O:BP (!) 97/54 (BP Location: Right Wrist)   Pulse 72   Temp 98.7 F (37.1 C) (Oral)   Resp 18   Ht 6\' 1"  (1.854 m)   Wt 86.3 kg   SpO2 100%   BMI 25.10 kg/m   Intake/Output Summary (Last 24 hours) at 01/13/2024 1053 Last data filed at 01/13/2024 0700 Gross per 24 hour  Intake 417 ml  Output 1000 ml  Net -583 ml   Intake/Output: I/O last 3 completed shifts: In: 1113 [P.O.:1113] Out: 1750 [Urine:1750]  Intake/Output this shift:  No intake/output data recorded. Weight change: 1.6 kg Gen: NAD CVS: RRR Resp:CTA Abd: +BS, soft, NT/ND Ext: no edema, LUE AVF +T/B  Recent Labs  Lab 01/07/24 0503 01/08/24 0549 01/12/24 0530  NA 130* 132* 134*  K 4.0 4.0 4.0  CL 94* 96* 101  CO2 27 26 21*  GLUCOSE 101* 104* 64*  BUN 36* 21 31*  CREATININE 3.22* 2.54* 3.47*  ALBUMIN 2.6* 2.5* 2.6*  CALCIUM 8.9 8.5* 9.0  PHOS 4.0 3.1 3.7   Liver Function Tests: Recent Labs  Lab 01/07/24 0503 01/08/24 0549 01/12/24 0530  ALBUMIN 2.6* 2.5* 2.6*   No results for input(s): "LIPASE", "AMYLASE" in the last 168 hours. No results for input(s): "AMMONIA" in the last 168 hours. CBC: Recent Labs  Lab 01/07/24 0503 01/08/24 0549 01/12/24 0530  WBC 7.6 8.7 7.2  NEUTROABS 5.0 6.8  --   HGB 8.3* 8.0* 8.9*  HCT 24.8* 24.2* 27.5*  MCV 92.5 92.7 92.9  PLT 175 172 192   Cardiac Enzymes: No results for input(s): "CKTOTAL", "CKMB", "CKMBINDEX", "TROPONINI" in the last 168 hours. CBG: Recent Labs  Lab 01/12/24 0704 01/12/24 1202 01/12/24 1823 01/12/24 2104 01/13/24 0609  GLUCAP 79 123* 97 124* 83    Iron Studies: No results for input(s): "IRON", "TIBC", "TRANSFERRIN", "FERRITIN" in the last 72 hours. Studies/Results: No results found.  aspirin  81 mg Oral Daily   atorvastatin  40 mg Oral Daily   Chlorhexidine Gluconate Cloth  6 each Topical  Q12H   cholecalciferol  3,000 Units Oral Daily   cycloSPORINE  1 drop Both Eyes BID   darbepoetin (ARANESP) injection - DIALYSIS  60 mcg Subcutaneous Q Thu-1800   dorzolamide  1 drop Both Eyes BID   And   timolol  1 drop Both Eyes BID   fentaNYL (SUBLIMAZE) injection  50 mcg Intravenous Once   FLUoxetine  20 mg Oral Daily   heparin  5,000 Units Subcutaneous Daily   [START ON 01/14/2024] insulin glargine-yfgn  9 Units Subcutaneous Daily   latanoprost  1 drop Left Eye QHS   midodrine  5 mg Oral Q M,W,F-HD   multivitamin  1 tablet Oral QHS   tiZANidine  2 mg Oral BID    BMET    Component Value Date/Time   NA 134 (L) 01/12/2024 0530   K 4.0 01/12/2024 0530   CL 101 01/12/2024 0530   CO2 21 (L) 01/12/2024 0530   GLUCOSE 64 (L) 01/12/2024 0530   BUN 31 (H) 01/12/2024 0530   CREATININE 3.47 (H) 01/12/2024 0530   CALCIUM 9.0 01/12/2024 0530   GFRNONAA 18 (L) 01/12/2024 0530   GFRAA 44 (L) 01/14/2018 1112   CBC    Component Value Date/Time   WBC 7.2 01/12/2024 0530   RBC 2.96 (L) 01/12/2024 0530  HGB 8.9 (L) 01/12/2024 0530   HCT 27.5 (L) 01/12/2024 0530   PLT 192 01/12/2024 0530   MCV 92.9 01/12/2024 0530   MCH 30.1 01/12/2024 0530   MCHC 32.4 01/12/2024 0530   RDW 12.0 01/12/2024 0530   LYMPHSABS 0.8 01/08/2024 0549   MONOABS 1.0 01/08/2024 0549   EOSABS 0.2 01/08/2024 0549   BASOSABS 0.0 01/08/2024 0549    Dialysis Orders: MWF - [facility] 4hr, 350/500, EDW 86.5kg, 2K/2.5Ca, TDC, heparin 1000U/hr - Venofer 50mg  IV q Monday - Mircera IV q 4 weeks (last 2/17)   Assessment/Plan: ESRD: Continue HD on MWF schedule. 1st stage BB AVF creation with VVS on 3/14.   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 Hx cholecystitis 09/2023: S/p IR chole tube, now removed. Urinary retention/bladder spasms - s/p Foley. Per urology.   HTN/volume: Blood pressures on the soft  side. Midodrine added here and limited UF.  He is slightly below his edw. Anemia of ESRD: Hgb 8.9. iron replete, on esa. Aranesp 60q q Thur -last on 3/20. Increase dose next week if stlil here  Secondary HPTH: Ca/Phos ok - not on binders, on Vit D3. Nutrition: Alb low, continue protein supplements. Diarrhea: per primary, seems improved T2DM Dispo: Pending discharge date 3/27     Irena Cords, MD [facility]

## 2024-01-13 NOTE — Progress Notes (Signed)
 Patient ID: Philip Benjamin, male   DOB: Nov 16, 1950, 73 y.o.   MRN: [id number]  Met with pt who reports having diarrhea and BP issues and does not feel ready to go home Thursday. Will discuss in team conference tomorrow. Will reach out to daughter also

## 2024-01-13 NOTE — Progress Notes (Signed)
 Physical Therapy Session Note  Patient Details  Name: Philip Benjamin MRN: [id number] Date of Birth: 09-Jul-1951  Today's Date: 01/13/2024 PT Individual Time: 0802-0915 + 1324-4010  PT Individual Time Calculation (min): 73 min  + 71 min  Short Term Goals: Week 3:  PT Short Term Goal 1 (Week 3): STG = LTG due to ELOS  Skilled Therapeutic Interventions/Progress Updates:       1st session: Pt lying in bed to start and in agreement to therapy treatment. Has no complaints of pain.    Lowered HOB and removed bed rails to simulate home setup. Patient unable to transfer from supine to sitting EOB without hospital bed features. However, with bed flat and use of bed rails ,he was able to transition to sitting EOB with cues.    Completed squat pivot transfer into wheelchair with minA with cues for setup and sequencing, bed height raised. Transported to ortho rehab gym to practice car transfers. Car height set to simulate their SUV. He completed a stand pivot transfer using the RW with minA for balance. Required minA for managing RLE into and out of vehicle.    Short distance gait training ~70ft with minA and RW - cues for latera lweight shifting, widening BOS, and reducing forward lean on RW frame. Assisted into supine position on mat table for strengthening exercises: -2x8 supine alternating knee to chest -2x8 supine double knee to chest -2x5 dead bug isometric position holds (x5 seconds) -1x6 crunches with hands behind head  Required modA for transitioning to sitting EOB due to increased fatigue and back discomfort. Sit<>stand and stand pivot transfer using RW back to his wheelchair at [facility] level.   Wheelchair moblity completed x127ft with supervision assist with BUE to propel - decreased speed and decreased stroke efficiency. Able to maintain straight path and manage doorways with cues.   Ended session seated upright in wheelchair. Setup for his breakfast tray and all his needs met.     2nd session: Pt sitting EOB to start - agreeable to therapy treatment. Has no complaints of pain. Reports having a dizzy spell earlier this AM and his BP was low. Pt asking to recheck his BP.   Pt assisted to wheelchair with a modA squat pivot transfer - bed height lowered and increased difficulty clearing hips during transfer.   Pt transported at wheelchair level to main rehab gym.  BP checked sitting: 122/60 HR 66. Pt asymptomatic.   Initiated stair training using 6" steps and 2 hand rails. Pt navigated up/down x4 + x4 + x4 steps (seated rest breaks b/w each set) with heavy minA. Completed with forward facing step-to pattern for both directions. Has a narrow BOS with foot overcrowding during both directions. Has persistent posterior lean throughout. Increased assist needed on the x3 set for lift and advancing his RLE 2/2 fatigue.   Finished session on Kinetron using seat belt for added safety. MinA for stand pivot transfer onto Kinetron. L40cm/sec resistance used - completed for x8 minutes with emphasis on full AROM. Needed intermittent assist for repositioning feet due to "sliding" into extension.  Provided patient with passive hamstring and heel cord stretching on his RLE at wheelchair level.   Returned to his room and left sitting upright with his needs met.  Therapy Documentation Precautions:  Precautions Precautions: Fall Precaution/Restrictions Comments: R hemi Restrictions Weight Bearing Restrictions Per Provider Order: No Other Position/Activity Restrictions: r hemiparesis General:     Therapy/Group: Individual Therapy  [name] 01/13/2024, 7:48 AM

## 2024-01-13 NOTE — Progress Notes (Signed)
 PROGRESS NOTE   Subjective/Complaints: No new complaints this morning Continues to have diarrhea, discussed that he can use an  immodium Discussed that CBGs are well controlled   ROS: +diarrhea, +malaise   Objective:   No results found.  Recent Labs    01/12/24 0530  WBC 7.2  HGB 8.9*  HCT 27.5*  PLT 192     Recent Labs    01/12/24 0530  NA 134*  K 4.0  CL 101  CO2 21*  GLUCOSE 64*  BUN 31*  CREATININE 3.47*  CALCIUM 9.0      Intake/Output Summary (Last 24 hours) at 01/13/2024 1324 Last data filed at 01/13/2024 1200 Gross per 24 hour  Intake 594 ml  Output 1000 ml  Net -406 ml         Physical Exam: Vital Signs Blood pressure 128/64, pulse 73, temperature 98.5 F (36.9 C), resp. rate 15, height 6\' 1"  (1.854 m), weight 86.3 kg, SpO2 100%.  Constitutional: No distress . Vital signs reviewed. HEENT: NCAT, EOMI, oral membranes moist Neck: supple Cardiovascular: Bradycardia Respiratory/Chest: CTA Bilaterally without wheezes or rales. Normal effort    GI/Abdomen: BS +, non-tender, non-distended Ext: no clubbing, cyanosis, or edema Psych: pleasant and cooperative  Neuro -     Comments: RUE- biceps 4+/5, Triceps 4+/5; WE 5-/5; Grip 4/5; FA 4-/5 LUE- Biceps, triceps 5-/5; WE 5-/5; Grip 4+/5; FA 4/5 RLE- HF 4-/5; otherwise 4/5 rest of RLE LLE- 4+/5 throughout-  Skin:    General: Skin is warm and dry.     Comments: R chest HD catheter present  Neurological:     Mental Status: He is oriented to person, place, and time.     Comments: fair insight and awareness. Normal language.  MAS of 2 in RLE- few beats clonus B/L LE's and MAS of 1+ in LLE 3+ DTRs B/L patella, stable 3/25      Assessment/Plan: 1. Functional deficits which require 3+ hours per day of interdisciplinary therapy in a comprehensive inpatient rehab setting. Physiatrist is providing close team supervision and 24 hour management of  active medical problems listed below. Physiatrist and rehab team continue to assess barriers to discharge/monitor patient progress toward functional and medical goals  Care Tool:  Bathing    Body parts bathed by patient: Right arm, Chest, Abdomen, Front perineal area, Right upper leg, Left upper leg, Face, Buttocks, Right lower leg, Left lower leg   Body parts bathed by helper: Left arm Body parts n/a: Left arm, Buttocks, Left lower leg, Right lower leg   Bathing assist Assist Level: Minimal Assistance - Patient > 75%     Upper Body Dressing/Undressing Upper body dressing   What is the patient wearing?: Pull over shirt    Upper body assist Assist Level: Minimal Assistance - Patient > 75%    Lower Body Dressing/Undressing Lower body dressing      What is the patient wearing?: Pants, Underwear/pull up     Lower body assist Assist for lower body dressing: Moderate Assistance - Patient 50 - 74%     Toileting Toileting    Toileting assist Assist for toileting: Moderate Assistance - Patient 50 - 74%  Transfers Chair/bed transfer  Transfers assist     Chair/bed transfer assist level: Minimal Assistance - Patient > 75%     Locomotion Ambulation   Ambulation assist      Assist level: Moderate Assistance - Patient 50 - 74% Assistive device: Walker-rolling Max distance: 63ft   Walk 10 feet activity   Assist     Assist level: Moderate Assistance - Patient - 50 - 74% Assistive device: Walker-rolling   Walk 50 feet activity   Assist Walk 50 feet with 2 turns activity did not occur: Safety/medical concerns         Walk 150 feet activity   Assist Walk 150 feet activity did not occur: Safety/medical concerns         Walk 10 feet on uneven surface  activity   Assist Walk 10 feet on uneven surfaces activity did not occur: Safety/medical concerns         Wheelchair     Assist Is the patient using a wheelchair?: Yes Type of  Wheelchair: Manual    Wheelchair assist level: Supervision/Verbal cueing Max wheelchair distance: 150'    Wheelchair 50 feet with 2 turns activity    Assist        Assist Level: Supervision/Verbal cueing   Wheelchair 150 feet activity     Assist      Assist Level: Supervision/Verbal cueing   Blood pressure 128/64, pulse 73, temperature 98.5 F (36.9 C), resp. rate 15, height 6\' 1"  (1.854 m), weight 86.3 kg, SpO2 100%.  Medical Problem List and Plan: 1. 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  Bleeding during catheterization/worsening anemia: decrease Heparin to q12H, SCDs ordered             -antiplatelet therapy: Aspirin 81 mg daily 3. Pain Management: Tylenol as needed   4. Situational depression/sleep:  Neuropsych consulted. Offered chaplain consult but patient defers. Prozac 20 mg daily, Xanax 0.25 mg 3 times daily as needed  3/23 -pt having difficulty sleeping--will order prn melatonin. Asked him to let his nurse know if he's having problems falling asleep. Avoid excessive daytime naps as well.              -antipsychotic agents: N/A 5. Neuropsych/cognition: This patient is capable of making decisions on his own behalf. 6. Skin/Wound Care: Routine skin checks 7. Fluids/Electrolytes/Nutrition: Routine in and outs with follow-up chemistries 8.  End-stage renal disease.  Hemodialysis per renal services, continue  9.  Type 2 diabetes mellitus, hemoglobin A1c 5.6. Decrease lantus to 9U daily.  D/c feeding supplement  CBG (last 3)  Recent Labs    01/12/24 2104 01/13/24 0609 01/13/24 1125  GLUCAP 124* 83 167*    10.  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  Discussed with Dr. Wynetta Emery neurosurgery placed on trial of steroids.  No current intervention at this time.   Needs to f/u with NSU after d/c.   11.  Anemia of chronic disease.  Follow-up CBC  12.  Glaucoma.  Legally blind left eye.  Continue eyedrops  13.  BPH. Continue Flomax 0.4 mg daily  14.  Hyperlipidemia.  Continue Lipitor  15.  Hypotension: decrease tizanidine to HS  16.  Recent cholecystitis.  Status post cholecystostomy tube placement drainage of abscess 12/16.  Follow-up outpatient  17.  Acute diarrhea.    continue imodium, d/c senna  18. 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 Bradycardia: medications reviewed and he is not on any heart rate lowering agents  resolved  20. Poor intake: recommended eating fruits since these are filled with fiber and will bulk his stool  21. Leukocytosis: resolved, continue keflex  22. Decreased urinary output: UA/UC ordered, discussed UA is positive, keflex started, f/u UC, greater than 100,000 Klebsiella pneumoniae,S to keflex, still symptomatic after 3 d abx, suspect bladder spasms will add antispasmodic low dose monitor for retention  23. Generalized weakness:CVA + SCI + debility, increase D3 to 3,000U daily  24. Decreased urination: discussed this could be 2/2 UTI or HD, see below  25. Bladder cramps: resolved, complete course of Bactrim  26. Anemia: Hgb reviewed and has improved  27. Urinary retention: foley placed by urology, continue until outpatient follow-up, discussed that daughter is a nurse and is accustomed to foley care, discussed that he should have f/u with urology 1 week after discharge  28. Hypotension: d/c flomax since foley is now inserted and hypotension could be contributing to weakness, continue tizanidine, discussed with patient.   29. Diabetic retinopathy: will help to schedule outpatient ophthalmology f/u    LOS: 21 days A FACE TO FACE EVALUATION WAS PERFORMED  Philip Benjamin P Philip Benjamin 01/13/2024, 1:24 PM

## 2024-01-13 NOTE — Progress Notes (Signed)
 Physical Therapy Discharge Summary  Patient Details  Name: Philip Benjamin MRN: [id number] Date of Birth: 11/24/1950  Date of Discharge from PT service:{Time; dates multiple:304500300}  {CHL IP REHAB PT TIME CALCULATION:304800500}   Patient has met {NUMBERS 0-12:18577} of {NUMBERS 0-12:18577} long term goals due to {due WU:9811914}.  Patient to discharge at short distance an ambulatory level Min Assist.   Patient's care partner is independent to provide the necessary physical and cognitive assistance at discharge.  Reasons goals not met: ***  Recommendation:  Patient will benefit from ongoing skilled PT services in home health setting to continue to advance safe functional mobility, address ongoing impairments in caregiver training, home safety, bed mobility, functional transfers, household mobility, and minimize fall risk.  Equipment: {equipment:3041657}  Reasons for discharge: discharge from hospital  Patient/family agrees with progress made and goals achieved: Yes  PT Discharge Precautions/Restrictions   Vital Signs   Pain Pain Assessment Pain Scale: 0-10 Pain Score: 0-No pain Pain Interference   Vision/Perception     Cognition   Sensation   Motor     Mobility   Locomotion     Trunk/Postural Assessment     Balance   Extremity Assessment            Junetta Hearn P Yasiel Goyne  PT, DPT, CSRS  01/13/2024, 8:47 AM

## 2024-01-13 NOTE — Progress Notes (Signed)
 Occupational Therapy Session Note  Patient Details  Name: Philip Benjamin MRN: [id number] Date of Birth: July 05, 1951  Today's Date: 01/13/2024 OT Individual Time: 4098-1191 OT Individual Time Calculation (min): 50 min    Short Term Goals: Week 1:  OT Short Term Goal 1 (Week 1): Patient will don pull over shirt over head with min assist OT Short Term Goal 1 - Progress (Week 1): Met OT Short Term Goal 2 (Week 1): Patient will don pants over feet with min assist OT Short Term Goal 2 - Progress (Week 1): Met OT Short Term Goal 3 (Week 1): Patient will pull pants up/down over hips with min assist OT Short Term Goal 3 - Progress (Week 1): Met OT Short Term Goal 4 (Week 1): Patient will complete stand step toilet transfer with close supervision OT Short Term Goal 4 - Progress (Week 1): Progressing toward goal OT Short Term Goal 5 (Week 1): Patient will feed himself with intermittent min assist following set up OT Short Term Goal 5 - Progress (Week 1): Met Week 2:  OT Short Term Goal 1 (Week 2): Patient will complete stand step toilet transfer with close supervision OT Short Term Goal 2 (Week 2): Pt will pull pants up/down over hips with CGA usign LRAD OT Short Term Goal 2 - Progress (Week 2): Not met OT Short Term Goal 3 (Week 2): Pt will donn pull over head shirt with supervision consistently OT Short Term Goal 3 - Progress (Week 2): Not met OT Short Term Goal 4 (Week 2): Pt tolerate standing >2 minutes during BADLs or functional activity using LRAD CGA OT Short Term Goal 4 - Progress (Week 2): Not met Week 3:  OT Short Term Goal 1 (Week 3): STG= Recently downgraded LTG due to continued medical concerns and physical decline.     Skilled Therapeutic Interventions/Progress Updates:  Pt received in bed and stated that after PT session he was sitting in w/c trying to eat when he felt like he was about to pass out.  Nursing got him to bed.  Pt did want to bathe and change clothing. His supine BP  102/51.  Recommended he take a conservative approach and work on LB self care from bed and then sit to EOB for UB.  Pt agreed.  Pt participated in LB bathing and dressing as much as possible with his limited hand grasp strength.  He sat to EOB with light CGA and was able to partially wash UB but needed A to ensure his axilla were thoroughly cleaned.  Pt able to doff and don tshirts without A.  Pt then used RW to stand up with min A to take several steps to his R to move closer to [facility]. Pt moved into supine with supervision.  Adjusted in bed with all needs met and alarm set.   Pt stated he was feeling better but opted to rest to prepare for PM PT session.   Therapy Documentation Precautions:  Precautions Precautions: Fall Precaution/Restrictions Comments: R hemi Restrictions Weight Bearing Restrictions Per Provider Order: No Other Position/Activity Restrictions: r hemiparesis Vital Signs: Therapy Vitals Pulse Rate: 72 BP: (!) 97/54 Patient Position (if appropriate): Lying Pain: Pain Assessment Pain Scale: 0-10 Pain Score: 0-No pain      Therapy/Group: Individual Therapy  Sahaana Weitman 01/13/2024, 10:00 AM

## 2024-01-14 ENCOUNTER — Other Ambulatory Visit (HOSPITAL_COMMUNITY): Payer: Self-pay

## 2024-01-14 ENCOUNTER — Encounter: Payer: Self-pay | Admitting: Oncology

## 2024-01-14 LAB — GLUCOSE, CAPILLARY
Glucose-Capillary: 93 mg/dL (ref 70–99)
Glucose-Capillary: 102 mg/dL — ABNORMAL HIGH (ref 70–99)
Glucose-Capillary: 137 mg/dL — ABNORMAL HIGH (ref 70–99)
Glucose-Capillary: 122 mg/dL — ABNORMAL HIGH (ref 70–99)

## 2024-01-14 MED ORDER — VITAMIN D3 25 MCG PO TABS
3000.0000 [IU] | ORAL_TABLET | Freq: Every day | ORAL | 0 refills | Status: AC
  Filled 2024-01-14: qty 90, 30d supply, fill #0

## 2024-01-14 MED ORDER — FLUOXETINE HCL 20 MG PO CAPS
ORAL_CAPSULE | Freq: Every day | ORAL | 0 refills | Status: AC
Start: 2024-01-14 — End: 2024-02-14
  Filled 2024-01-14 (×2): qty 30, 30d supply, fill #0

## 2024-01-14 MED ORDER — HEPARIN SODIUM (PORCINE) 1000 UNIT/ML IJ SOLN
INTRAMUSCULAR | Status: AC
  Administered 2024-01-14: 3200 [IU]
  Filled 2024-01-14: qty 3

## 2024-01-14 MED ORDER — DOCUSATE SODIUM 100 MG PO CAPS
100.0000 mg | ORAL_CAPSULE | Freq: Once | ORAL | Status: AC
  Administered 2024-01-14: 100 mg via ORAL
  Filled 2024-01-14: qty 1

## 2024-01-14 MED ORDER — MIDODRINE HCL 5 MG PO TABS
5.0000 mg | ORAL_TABLET | ORAL | 0 refills | Status: AC
  Filled 2024-01-14: qty 30, 70d supply, fill #0

## 2024-01-14 MED ORDER — RENA-VITE PO TABS
1.0000 | ORAL_TABLET | Freq: Every day | ORAL | 0 refills | Status: AC
  Filled 2024-01-14: qty 30, 30d supply, fill #0

## 2024-01-14 MED ORDER — FOLIC ACID 800 MCG PO TABS
800.0000 ug | ORAL_TABLET | Freq: Every day | ORAL | 0 refills | Status: AC
Start: 2024-01-14 — End: ?
  Filled 2024-01-14: qty 30, 30d supply, fill #0

## 2024-01-14 MED ORDER — TIZANIDINE HCL 2 MG PO TABS
2.0000 mg | ORAL_TABLET | Freq: Every day | ORAL | 0 refills | Status: AC
  Filled 2024-01-14: qty 30, 30d supply, fill #0

## 2024-01-14 MED ORDER — INSULIN GLARGINE-YFGN 100 UNIT/ML ~~LOC~~ SOLN
4.0000 [IU] | Freq: Every day | SUBCUTANEOUS | Status: DC
  Administered 2024-01-15: 4 [IU] via SUBCUTANEOUS
  Filled 2024-01-14: qty 0.04

## 2024-01-14 MED ORDER — ASPIRIN 81 MG PO CHEW: 81.0000 mg | CHEWABLE_TABLET | Freq: Every day | ORAL | Status: DC

## 2024-01-14 MED ORDER — ALPRAZOLAM 0.25 MG PO TABS
0.2500 mg | ORAL_TABLET | Freq: Three times a day (TID) | ORAL | 0 refills | Status: AC | PRN
  Filled 2024-01-14: qty 10, 4d supply, fill #0

## 2024-01-14 MED ORDER — INSULIN PEN NEEDLE 32G X 4 MM MISC
0 refills | Status: AC
  Filled 2024-01-14: qty 100, 90d supply, fill #0

## 2024-01-14 MED ORDER — INSULIN GLARGINE 100 UNIT/ML SOLOSTAR PEN
9.0000 [IU] | PEN_INJECTOR | Freq: Every day | SUBCUTANEOUS | 11 refills | Status: AC
  Filled 2024-01-14: qty 9, 84d supply, fill #0

## 2024-01-14 MED ORDER — OXYBUTYNIN CHLORIDE 5 MG PO TABS
2.5000 mg | ORAL_TABLET | Freq: Three times a day (TID) | ORAL | 0 refills | Status: DC | PRN
  Filled 2024-01-14: qty 15, 10d supply, fill #0

## 2024-01-14 MED ORDER — ATORVASTATIN CALCIUM 40 MG PO TABS
40.0000 mg | ORAL_TABLET | Freq: Every day | ORAL | 0 refills | Status: AC
Start: 2024-01-14 — End: ?
  Filled 2024-01-14: qty 30, 30d supply, fill #0

## 2024-01-14 NOTE — Progress Notes (Signed)
 Occupational Therapy Discharge Summary  Patient Details  Name: Philip Benjamin MRN: [id number] Date of Birth: 1951-08-23  Date of Discharge from OT service:January 14, 2024  Today's Date: 01/14/2024 OT Individual Time: 4098-1191 and 1130 - 1156 OT Individual Time Calculation (min): 75 min and 26 min  Skilled Therapeutic Intervention:   1st session:  Patient received supine in bed, reports not feeling well - but better than yesterday.  Patient agreeable to getting out of bed to bathe and change clothing.  Patient aware that he has HD today and requesting a button down shirt to help access port.   Patient effectively able to use wash mitt to wash once mitt applied to R hand.  Patient also is learning to effectively use the button hook.  Patient with improved stand tolerance to standing - yet needs help to grasp and pull up/down pants.    2nd session:  Patient received supine in bed.  Reviewed goals of OT and overall progress.  Patient reports disappointment at his overall lack of functional improvement.  Reinforced that patient has worked hard and has had multiple medical issues and premorbid on-going medical issues.  Discussed importance of adequate lighting, reinforced that it would have been optimal to train his family - but he feels very comfortable with them caring for him, and feels his functional level is not drastically different from prior to this hospitalization.  Patient very appreciative of the care he received here in rehab.    Patient has met 7 of 9 long term goals due to improved activity tolerance, improved balance, and postural control.  Patient to discharge at overall Mod Assist level.  Patient's care partner unavailable / declined need for caregiver training as she has taken care of him prior to this admission and feels she can provide the necessary physical assistance at discharge.    Reasons goals not met: persistent medical issues - edema, visual acuity, spasticity, spinal  stenosis  Recommendation:  Patient will benefit from ongoing skilled OT services in home health setting to continue to advance functional skills in the area of BADL and Reduce care partner burden.  Equipment: No equipment provided  Reasons for discharge: discharge from hospital  Patient/family agrees with progress made and goals achieved: Yes  OT Discharge Precautions/Restrictions  Precautions Precautions: Fall Recall of Precautions/Restrictions: Intact Precaution/Restrictions Comments: HD MWF, baseline vision impairments (glaucoma) Restrictions Weight Bearing Restrictions Per Provider Order: No Other Position/Activity Restrictions: MILD r hemiparesis   Ist session:   Pain Assessment Pain Score: 0-No pain 2nd session  Pain Pain Assessment Pain Score: 0-No pain ADL ADL Equipment Provided: Other (comment) (Wash mitt, button hook) Eating: Set up Where Assessed-Eating: Chair Grooming: Setup Where Assessed-Grooming: Sitting at sink Upper Body Bathing: Minimal assistance Where Assessed-Upper Body Bathing: Sitting at sink Lower Body Bathing: Minimal assistance Where Assessed-Lower Body Bathing: Sitting at sink, Standing at sink Upper Body Dressing: Minimal assistance Where Assessed-Upper Body Dressing: Sitting at sink Lower Body Dressing: Maximal assistance (due to recent indwelling catheter and cath bag management) Where Assessed-Lower Body Dressing: Sitting at sink, Standing at sink Toileting: Moderate assistance Where Assessed-Toileting: Teacher, adult education: Curator Method: Surveyor, minerals: Bedside commode, Grab bars Tub/Shower Transfer: Moderate assistance Tub/Shower Transfer Method: Stand pivot Tub/Shower Equipment: Insurance underwriter: Unable to assess Film/video editor Method: Unable to assess ADL Comments: does not currently shower due to chest port.  Has transfer tub bench at  home, recommend a grab bar when/if  he does return to showering Vision Baseline Vision/History: 1 Wears glasses;3 Glaucoma;2 Legally blind Patient Visual Report: No change from baseline;Other (comment) (feels that vision is getting worse since starting dialysis) Vision Assessment?: Yes Eye Alignment: Within Functional Limits Ocular Range of Motion: Within Functional Limits Alignment/Gaze Preference: Within Defined Limits Tracking/Visual Pursuits: Able to track stimulus in all quads without difficulty Visual Fields: No apparent deficits Perception  Perception: Within Functional Limits Praxis Praxis: WFL Cognition Cognition Overall Cognitive Status: History of cognitive impairments - at baseline Arousal/Alertness: Awake/alert Memory: Impaired Memory Impairment: Decreased short term memory;Retrieval deficit Decreased Short Term Memory: Functional basic Attention: Selective Selective Attention: Impaired Selective Attention Impairment: Functional complex Awareness: Appears intact Problem Solving: Impaired Problem Solving Impairment: Functional basic;Functional complex Executive Function: Organizing;Initiating;Decision Making Sequencing: Appears intact Sequencing Impairment: Functional basic Organizing: Impaired Organizing Impairment: Functional basic Decision Making: Impaired Decision Making Impairment: Verbal complex;Functional complex Initiating: Impaired Initiating Impairment: Functional basic Behaviors: Other (comment) (developing learned helplessness) Safety/Judgment: Appears intact Comments: hypervigilant Brief Interview for Mental Status (BIMS) Repetition of Three Words (First Attempt): 3 Temporal Orientation: Year: Correct Temporal Orientation: Month: Accurate within 5 days Temporal Orientation: Day: Correct Recall: "Sock": Yes, no cue required Recall: "Blue": Yes, no cue required Recall: "Bed": Yes, no cue required BIMS Summary Score: 15 Sensation Sensation Light  Touch: Impaired by gross assessment Hot/Cold: Appears Intact Proprioception: Impaired by gross assessment Stereognosis: Not tested Additional Comments: RUE more impaired than LUE, mild report of tingling bilaterally prior to this hospitalization - now more pronounced RUE Coordination Gross Motor Movements are Fluid and Coordinated: No Fine Motor Movements are Fluid and Coordinated: No Coordination and Movement Description: R hemi w/ balance impairments Motor  Motor Motor: Hemiplegia;Abnormal tone;Abnormal postural alignment and control;Motor impersistence;Ataxia Motor - Discharge Observations: has mild hemiplegic like deficits, more pronounced spinal deficits in hands and legs with stiffness/ hypertonicity, decreased volitional control Mobility  Bed Mobility Bed Mobility: Rolling Right;Rolling Left;Supine to Sit;Sit to Supine Rolling Right: Supervision/verbal cueing Rolling Left: Supervision/Verbal cueing Supine to Sit: Minimal Assistance - Patient > 75% Sit to Supine: Supervision/Verbal cueing Transfers Sit to Stand: Contact Guard/Touching assist Stand to Sit: Contact Guard/Touching assist  Trunk/Postural Assessment  Cervical Assessment Cervical Assessment: Exceptions to [facility] Thoracic Assessment Thoracic Assessment: Exceptions to [facility] Lumbar Assessment Lumbar Assessment: Within Functional Limits Postural Control Postural Control: Deficits on evaluation Trunk Control: Poor A/P control Righting Reactions: Delayed and insufficient  Balance Balance Balance Assessed: Yes Standardized Balance Assessment Standardized Balance Assessment: Berg Balance Test Berg Balance Test Sit to Stand: Able to stand using hands after several tries Standing Unsupported: Unable to stand 30 seconds unassisted Sitting with Back Unsupported but Feet Supported on Floor or Stool: Able to sit safely and securely 2 minutes Stand to Sit: Uses backs of legs against chair to control descent Transfers: Needs  one person to assist Standing Unsupported with Eyes Closed: Needs help to keep from falling Standing Ubsupported with Feet Together: Needs help to attain position and unable to hold for 15 seconds From Standing, Reach Forward with Outstretched Arm: Loses balance while trying/requires external support From Standing Position, Pick up Object from Floor: Unable to try/needs assist to keep balance From Standing Position, Turn to Look Behind Over each Shoulder: Needs assist to keep from losing balance and falling Turn 360 Degrees: Needs assistance while turning Standing Unsupported, Alternately Place Feet on Step/Stool: Needs assistance to keep from falling or unable to try Standing Unsupported, One Foot in Front: Loses balance while stepping or standing Standing on One  Leg: Unable to try or needs assist to prevent fall Total Score: 9 Static Sitting Balance Static Sitting - Balance Support: No upper extremity supported;Feet supported Static Sitting - Level of Assistance: 7: Independent Dynamic Sitting Balance Dynamic Sitting - Balance Support: Feet supported;No upper extremity supported;During functional activity Dynamic Sitting - Level of Assistance: 5: Stand by assistance Static Standing Balance Static Standing - Balance Support: Right upper extremity supported;Left upper extremity supported;During functional activity Static Standing - Level of Assistance: 4: Min assist Dynamic Standing Balance Dynamic Standing - Balance Support: Right upper extremity supported;Left upper extremity supported;During functional activity Dynamic Standing - Level of Assistance: 3: Mod assist Extremity/Trunk Assessment RUE Assessment Active Range of Motion (AROM) Comments: Has ~100* shoulder flexion with excessive thoracic / cervical extension General Strength Comments: 4-/5 LUE Assessment Active Range of Motion (AROM) Comments: Has 75* shoulder flexion with excessive cervical thoracic extension General Strength  Comments: 3+/5   Collier Salina 01/14/2024, 11:57 AM

## 2024-01-14 NOTE — Plan of Care (Signed)
  Problem: Consults Goal: RH STROKE PATIENT EDUCATION Description: See Patient Education module for education specifics  Outcome: Progressing   Problem: RH BOWEL ELIMINATION Goal: RH STG MANAGE BOWEL WITH ASSISTANCE Description: STG Manage Bowel with mod I Assistance. Outcome: Progressing   Problem: RH BLADDER ELIMINATION Goal: RH STG MANAGE BLADDER WITH ASSISTANCE Description: STG Manage Bladder With min Assistance Outcome: Progressing   Problem: RH SAFETY Goal: RH STG ADHERE TO SAFETY PRECAUTIONS W/ASSISTANCE/DEVICE Description: STG Adhere to Safety Precautions With cues  Assistance/Device. Outcome: Progressing   Problem: RH PAIN MANAGEMENT Goal: RH STG PAIN MANAGED AT OR BELOW PT'S PAIN GOAL Description: < 4 with prns Outcome: Progressing   Problem: RH KNOWLEDGE DEFICIT Goal: RH STG INCREASE KNOWLEDGE OF DIABETES Description: Patient and family will be able to manage DM using educational resources for medications and dietary modification independently Outcome: Progressing Goal: RH STG INCREASE KNOWLEDGE OF HYPERTENSION Description: Patient and family will be able to manage HTN using educational resources for medications and dietary modification independently Outcome: Progressing Goal: RH STG INCREASE KNOWLEGDE OF HYPERLIPIDEMIA Description: Patient and family will be able to manage HLD using educational resources for medications and dietary modification independently Outcome: Progressing Goal: RH STG INCREASE KNOWLEDGE OF STROKE PROPHYLAXIS Description: Patient and family will be able to manage secondary risks using educational resources for medications and dietary modification independently Outcome: Progressing

## 2024-01-14 NOTE — Progress Notes (Signed)
 Physical Therapy Session Note  Patient Details  Name: TYHEEM BOUGHNER MRN: [id number] Date of Birth: 10-15-1951  Today's Date: 01/14/2024 PT Individual Time: 1100-1130 PT Individual Time Calculation (min): 30 min   Short Term Goals: Week 3:  PT Short Term Goal 1 (Week 3): STG = LTG due to ELOS  Skilled Therapeutic Interventions/Progress Updates:      Pt presents sleeping in wheelchair - wakes up to voice and is agreeable to PT tx.   Transported to day room rehab gym for time management. MinA for squat pivot transfer to Nustep. Completed 10.5 minutes at L5 resistance and achieved 300 steps in this time. Cues for full AROM bilaterally.   MinA for squat pivot transfer back to wheelchair. Nursing asking for patient to return to bed for dialysis. MinA for squat pivot back to bed and patient able to return to supine without assist. All needs met at end.   Therapy Documentation Precautions:  Precautions Precautions: Fall Recall of Precautions/Restrictions: Intact Precaution/Restrictions Comments: HD MWF, baseline vision impairments (glaucoma) Restrictions Weight Bearing Restrictions Per Provider Order: No Other Position/Activity Restrictions: MILD r hemiparesis General:     Therapy/Group: Individual Therapy  Cavon Nicolls P Brion Sossamon 01/14/2024, 10:10 AM

## 2024-01-14 NOTE — Progress Notes (Signed)
 Physical Therapy Session Note  Patient Details  Name: Philip Benjamin MRN: [id number] Date of Birth: Sep 08, 1951  Today's Date: 01/14/2024 PT Individual Time: 0900-0956 PT Individual Time Calculation (min): 56 min   Short Term Goals: Week 3:  PT Short Term Goal 1 (Week 3): STG = LTG due to ELOS  Skilled Therapeutic Interventions/Progress Updates:      Pt sitting in wheelchair to start - has no complaints of pain but continues to report decline in vision 2/2 dialysis and glaucoma. Opened up blinds in room to allow more natural light to help with his vision.   Transported at wheelchair level to main rehab gym. Discussed DC planning, PT recommendations, DME recommendations, follow up therapy, and home safety. No family has been present for hands on family education or training and patient reports his daughter is an Charity fundraiser who feels comfortable with his impairments.   Completed portions of the BERG balance test - patient scored 9/56 indicating high falls risk. Pt relies heavily on UE support for balance - unable to stand without UE support for > 5 seconds without LOB.  Completed repeated sit<>stands from mat table 2x5 with CGA to RW.   Stair training completed using 6" steps and 2 hands rails - required modA overall for navigating x4 steps with a step-to forward facing pattern. External assist needed to help lift RLE up each step 2/2 spasticity. Reviewed recommendation of avoiding stairs at home with family due to falls risk and he voiced understanding.   Short distance gait training 56ft with minA and RW while providing a close w/c follow. Gait distance limited by RLE spasticity and fatigue. After 63ft unable to advance RLE through swing phase.   Pt returned to his room at end of the session - left sitting upright in wheelchair with his needs met.    Therapy Documentation Precautions:  Precautions Precautions: Fall Precaution/Restrictions Comments: R hemi Restrictions Weight Bearing  Restrictions Per Provider Order: No Other Position/Activity Restrictions: r hemiparesis General:      Therapy/Group: Individual Therapy  [name] 01/14/2024, 7:55 AM

## 2024-01-14 NOTE — Progress Notes (Addendum)
   01/14/24 1725  Vitals  Temp 98.2 F (36.8 C)  Pulse Rate 63  Resp 14  BP 132/64  SpO2 100 %  O2 Device Room Air  Weight 85.4 kg  Type of Weight Post-Dialysis  Oxygen Therapy  Patient Activity (if Appropriate) In bed  Pulse Oximetry Type Continuous  Oximetry Probe Site Changed No  Post Treatment  Dialyzer Clearance Lightly streaked  Hemodialysis Intake (mL) 0 mL  Liters Processed 72  Fluid Removed (mL) 1000 mL  Tolerated HD Treatment Yes    Received patient in bed to unit.  Alert and oriented.  Informed consent signed and in chart.   TX duration: Three hours  Patient tolerated well.  Transported back to the room  Alert, without acute distress.  Hand-off given to patient's nurse.   Access used: Right chest catheter Access issues: Pt had drainage inside right catheter dressing. MD [place] at bedside to eval. Removed previous dressing and placed gauze dressing over access site. Right chest catheter will be re-evaled on 01/15/24 and new dressing placed per MD.

## 2024-01-14 NOTE — Progress Notes (Signed)
 Inpatient Rehabilitation Discharge Medication Review by a Pharmacist  A complete drug regimen review was completed for this patient to identify any potential clinically significant medication issues.  High Risk Drug Classes Is patient taking? Indication by Medication  Antipsychotic No   Anticoagulant No   Antibiotic No   Opioid No   Antiplatelet Yes Aspirin 81 mg - CVA prophylaxis  Hypoglycemics/insulin Yes Insuline Glargine- DM Type2  Vasoactive Medication Yes Midodrine MWF prior to HD - hypotension  Chemotherapy No   Other Yes Atorvastatin - hyperlipidemia Restasis, Latanoprost, Cosopt -  glaucoma Fluoxetine - mood stabilization Tizanidine - muscle relaxer Renal vitamin, folate, Vitamin D - supplements  PRNs: Acetaminophen - pain or fever Alprazolam - anxiety Imodium - diarrhea/loose stools Oxybutynin - bladder spasms     Type of Medication Issue Identified Description of Issue Recommendation(s)  Drug Interaction(s) (clinically significant)     Duplicate Therapy     Allergy     No Medication Administration End Date     Incorrect Dose     Additional Drug Therapy Needed     Significant med changes from prior encounter (inform family/care partners about these prior to discharge). Tamsulosin discontinued. Dexamethasone taper completed 3/11. Communicate changes with patient/family prior to discharge.  Other       Clinically significant medication issues were identified that warrant physician communication and completion of prescribed/recommended actions by midnight of the next day:  No  Time spent performing this drug regimen review (minutes): 15  Thank you for allowing pharmacy to be a part of this patient's care.   Dennie Fetters, [place] 01/14/2024 6:06 PM

## 2024-01-14 NOTE — Progress Notes (Signed)
 Spoke to [facility] staff to be advised that pt should d/c tomorrow and resume care on Friday. Will fax d/c summary to clinic for continuation of care once completed. Will assist as needed.   Olivia Canter Renal Navigator [phone]

## 2024-01-14 NOTE — Progress Notes (Signed)
 Met with pt and spoke with daughter-[name] via telephone to give both team conference update regarding goals of min-mod level and discharge tomorrow. Pt is very happy about this and daughter is prepared for him to come home. Discussed with both the recommendation for a hospital bed and pt deferred to daughter. Daughter feels there is really no place for it and she wants to see how it goes first. Gave her the option if changed her mind to contact this worker and it could be ordered. Have contacted [facility] to let them know of discharge home tomorrow and sen the order for the services to resume and added an aide to the order. See in ma to make sure no other concerns or questions.

## 2024-01-14 NOTE — Progress Notes (Signed)
 PROGRESS NOTE   Subjective/Complaints: Now feeling constipated, colace one time ordered, discussed excellent CBGs   ROS: +diarrhea, +malaise   Objective:   No results found.  Recent Labs    01/12/24 0530  WBC 7.2  HGB 8.9*  HCT 27.5*  PLT 192     Recent Labs    01/12/24 0530  NA 134*  K 4.0  CL 101  CO2 21*  GLUCOSE 64*  BUN 31*  CREATININE 3.47*  CALCIUM 9.0      Intake/Output Summary (Last 24 hours) at 01/14/2024 1111 Last data filed at 01/14/2024 0747 Gross per 24 hour  Intake 591 ml  Output 1025 ml  Net -434 ml         Physical Exam: Vital Signs Blood pressure 126/67, pulse 75, temperature 98.1 F (36.7 C), resp. rate 17, height 6\' 1"  (1.854 m), weight 86 kg, SpO2 100%.  Constitutional: No distress . Vital signs reviewed. HEENT: NCAT, EOMI, oral membranes moist Neck: supple Cardiovascular: Bradycardia Respiratory/Chest: CTA Bilaterally without wheezes or rales. Normal effort    GI/Abdomen: BS +, non-tender, non-distended Ext: no clubbing, cyanosis, or edema Psych: pleasant and cooperative  Neuro -     Comments: RUE- biceps 4+/5, Triceps 4+/5; WE 5-/5; Grip 4/5; FA 4-/5 LUE- Biceps, triceps 5-/5; WE 5-/5; Grip 4+/5; FA 4/5 RLE- HF 4-/5; otherwise 4/5 rest of RLE LLE- 4+/5 throughout-  Skin:    General: Skin is warm and dry.     Comments: R chest HD catheter present  Neurological:     Mental Status: He is oriented to person, place, and time.     Comments: fair insight and awareness. Normal language.  MAS of 2 in RLE- few beats clonus B/L LE's and MAS of 1+ in LLE 3+ DTRs B/L patella, stable 3/26      Assessment/Plan: 1. Functional deficits which require 3+ hours per day of interdisciplinary therapy in a comprehensive inpatient rehab setting. Physiatrist is providing close team supervision and 24 hour management of active medical problems listed below. Physiatrist and rehab team  continue to assess barriers to discharge/monitor patient progress toward functional and medical goals  Care Tool:  Bathing    Body parts bathed by patient: Right arm, Chest, Abdomen, Front perineal area, Right upper leg, Left upper leg, Face, Buttocks, Right lower leg, Left lower leg   Body parts bathed by helper: Left arm Body parts n/a: Left arm, Buttocks, Left lower leg, Right lower leg   Bathing assist Assist Level: Minimal Assistance - Patient > 75%     Upper Body Dressing/Undressing Upper body dressing   What is the patient wearing?: Pull over shirt    Upper body assist Assist Level: Minimal Assistance - Patient > 75%    Lower Body Dressing/Undressing Lower body dressing      What is the patient wearing?: Pants, Underwear/pull up     Lower body assist Assist for lower body dressing: Moderate Assistance - Patient 50 - 74%     Toileting Toileting    Toileting assist Assist for toileting: Moderate Assistance - Patient 50 - 74%     Transfers Chair/bed transfer  Transfers assist     Chair/bed  transfer assist level: Minimal Assistance - Patient > 75%     Locomotion Ambulation   Ambulation assist      Assist level: Minimal Assistance - Patient > 75% Assistive device: Walker-rolling Max distance: 45ft   Walk 10 feet activity   Assist     Assist level: Minimal Assistance - Patient > 75% Assistive device: Walker-rolling   Walk 50 feet activity   Assist Walk 50 feet with 2 turns activity did not occur: Safety/medical concerns         Walk 150 feet activity   Assist Walk 150 feet activity did not occur: Safety/medical concerns         Walk 10 feet on uneven surface  activity   Assist Walk 10 feet on uneven surfaces activity did not occur: Safety/medical concerns         Wheelchair     Assist Is the patient using a wheelchair?: Yes Type of Wheelchair: Manual    Wheelchair assist level: Supervision/Verbal cueing Max  wheelchair distance: 150'    Wheelchair 50 feet with 2 turns activity    Assist        Assist Level: Supervision/Verbal cueing   Wheelchair 150 feet activity     Assist      Assist Level: Supervision/Verbal cueing   Blood pressure 126/67, pulse 75, temperature 98.1 F (36.7 C), resp. rate 17, height 6\' 1"  (1.854 m), weight 86 kg, SpO2 100%.  Medical Problem List and Plan: 1. 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  Bleeding during catheterization/worsening anemia: decrease Heparin to q12H, SCDs ordered             -antiplatelet therapy: Aspirin 81 mg daily 3. Pain Management: Tylenol as needed   4. Situational depression/sleep:  Neuropsych consulted. Offered chaplain consult but patient defers. Prozac 20 mg daily, Xanax 0.25 mg 3 times daily as needed  3/23 -pt having difficulty sleeping--will order prn melatonin. Asked him to let his nurse know if he's having problems falling asleep. Avoid excessive daytime naps as well.              -antipsychotic agents: N/A 5. Neuropsych/cognition: This patient is capable of making decisions on his own behalf. 6. Skin/Wound Care: Routine skin checks 7. Fluids/Electrolytes/Nutrition: Routine in and outs with follow-up chemistries 8.  End-stage renal disease.  Hemodialysis per renal services, continue  9.  Type 2 diabetes mellitus, hemoglobin A1c 5.6. Decrease lantus to 9U daily.  D/c feeding supplement  CBG (last 3)  Recent Labs    01/13/24 1707 01/13/24 2125 01/14/24 0624  GLUCAP 108* 113* 93    10.  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  Discussed with Dr. Wynetta Emery neurosurgery placed on trial of steroids.  No current intervention at this time.  Needs to f/u with NSU after d/c.   11.  Anemia of chronic disease.  Follow-up  CBC  12.  Glaucoma.  Legally blind left eye.  Continue eyedrops  13.  BPH. Continue Flomax 0.4 mg daily  14.  Hyperlipidemia.  Continue Lipitor  15.  Hypotension: decrease tizanidine to HS  16.  Recent cholecystitis.  Status post cholecystostomy tube placement drainage of abscess 12/16.  Follow-up  outpatient  17.  Constipation: colace ordered x1  18. 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 Bradycardia: medications reviewed and he is not on any heart rate lowering agents  resolved  20. Poor intake: recommended eating fruits since these are filled with fiber and will bulk his stool  21. Leukocytosis: resolved, continue keflex  22. Decreased urinary output: UA/UC ordered, discussed UA is positive, keflex started, f/u UC, greater than 100,000 Klebsiella pneumoniae,S to keflex, still symptomatic after 3 d abx, suspect bladder spasms will add antispasmodic low dose monitor for retention  23. Generalized weakness:CVA + SCI + debility, continue D3 3,000U daily  24. Decreased urination: discussed this could be 2/2 UTI or HD, see below  25. Bladder cramps: resolved, complete course of Bactrim  26. Anemia: Hgb reviewed and has improved, decreased heparin to daily  27. Urinary retention: foley placed by urology, continue until outpatient follow-up, discussed that daughter is a nurse and is accustomed to foley care, discussed that he should have f/u with urology 1 week after discharge  28. Hypotension: d/c flomax since foley is now inserted and hypotension could be contributing to weakness, continue tizanidine, discussed with patient, decrease tizanidine to HS  29. Diabetic retinopathy: will help to schedule outpatient ophthalmology f/u    LOS: 22 days A FACE TO FACE EVALUATION WAS PERFORMED  Aiyanah Kalama P Kamen Hanken 01/14/2024, 11:11 AM

## 2024-01-14 NOTE — Progress Notes (Signed)
 Patient ID: Philip Benjamin, male   DOB: Mar 14, 1951, 73 y.o.   MRN: [id number] S: No complaints O:BP 126/67   Pulse 75   Temp 98.1 F (36.7 C)   Resp 17   Ht 6\' 1"  (1.854 m)   Wt 86 kg   SpO2 100%   BMI 25.01 kg/m   Intake/Output Summary (Last 24 hours) at 01/14/2024 1106 Last data filed at 01/14/2024 0747 Gross per 24 hour  Intake 591 ml  Output 1025 ml  Net -434 ml   Intake/Output: I/O last 3 completed shifts: In: 591 [P.O.:591] Out: 1525 [Urine:1525]  Intake/Output this shift:  Total I/O In: 237 [P.O.:237] Out: -  Weight change: -0.1 kg Gen: NAD CVS: RRR Resp:CTA Abd: +BS, soft, NT/ND Ext: no edema, LUE AVF +T/B  Recent Labs  Lab 01/08/24 0549 01/12/24 0530  NA 132* 134*  K 4.0 4.0  CL 96* 101  CO2 26 21*  GLUCOSE 104* 64*  BUN 21 31*  CREATININE 2.54* 3.47*  ALBUMIN 2.5* 2.6*  CALCIUM 8.5* 9.0  PHOS 3.1 3.7   Liver Function Tests: Recent Labs  Lab 01/08/24 0549 01/12/24 0530  ALBUMIN 2.5* 2.6*   No results for input(s): "LIPASE", "AMYLASE" in the last 168 hours. No results for input(s): "AMMONIA" in the last 168 hours. CBC: Recent Labs  Lab 01/08/24 0549 01/12/24 0530  WBC 8.7 7.2  NEUTROABS 6.8  --   HGB 8.0* 8.9*  HCT 24.2* 27.5*  MCV 92.7 92.9  PLT 172 192   Cardiac Enzymes: No results for input(s): "CKTOTAL", "CKMB", "CKMBINDEX", "TROPONINI" in the last 168 hours. CBG: Recent Labs  Lab 01/13/24 0609 01/13/24 1125 01/13/24 1707 01/13/24 2125 01/14/24 0624  GLUCAP 83 167* 108* 113* 93    Iron Studies: No results for input(s): "IRON", "TIBC", "TRANSFERRIN", "FERRITIN" in the last 72 hours. Studies/Results: No results found.  aspirin  81 mg Oral Daily   atorvastatin  40 mg Oral Daily   Chlorhexidine Gluconate Cloth  6 each Topical Q12H   cholecalciferol  3,000 Units Oral Daily   cycloSPORINE  1 drop Both Eyes BID   darbepoetin (ARANESP) injection - DIALYSIS  60 mcg Subcutaneous Q Thu-1800   docusate sodium  100 mg Oral  Once   dorzolamide  1 drop Both Eyes BID   And   timolol  1 drop Both Eyes BID   fentaNYL (SUBLIMAZE) injection  50 mcg Intravenous Once   FLUoxetine  20 mg Oral Daily   heparin  5,000 Units Subcutaneous Daily   [START ON 01/15/2024] insulin glargine-yfgn  4 Units Subcutaneous Daily   latanoprost  1 drop Left Eye QHS   midodrine  5 mg Oral Q M,W,F-HD   multivitamin  1 tablet Oral QHS   tiZANidine  2 mg Oral QHS    BMET    Component Value Date/Time   NA 134 (L) 01/12/2024 0530   K 4.0 01/12/2024 0530   CL 101 01/12/2024 0530   CO2 21 (L) 01/12/2024 0530   GLUCOSE 64 (L) 01/12/2024 0530   BUN 31 (H) 01/12/2024 0530   CREATININE 3.47 (H) 01/12/2024 0530   CALCIUM 9.0 01/12/2024 0530   GFRNONAA 18 (L) 01/12/2024 0530   GFRAA 44 (L) 01/14/2018 1112   CBC    Component Value Date/Time   WBC 7.2 01/12/2024 0530   RBC 2.96 (L) 01/12/2024 0530   HGB 8.9 (L) 01/12/2024 0530   HCT 27.5 (L) 01/12/2024 0530   PLT 192 01/12/2024 0530   MCV  92.9 01/12/2024 0530   MCH 30.1 01/12/2024 0530   MCHC 32.4 01/12/2024 0530   RDW 12.0 01/12/2024 0530   LYMPHSABS 0.8 01/08/2024 0549   MONOABS 1.0 01/08/2024 0549   EOSABS 0.2 01/08/2024 0549   BASOSABS 0.0 01/08/2024 0549    Dialysis Orders: MWF - [facility] 4hr, 350/500, EDW 86.5kg, 2K/2.5Ca, TDC, heparin 1000U/hr - Venofer 50mg  IV q Monday - Mircera IV q 4 weeks (last 2/17)   Assessment/Plan: ESRD: Continue HD on MWF schedule. 1st stage BB AVF creation with VVS on 3/14.   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 Hx cholecystitis 09/2023: S/p IR chole tube, now removed. Urinary retention/bladder spasms - s/p Foley. Per urology.   HTN/volume: Blood pressures on the soft side. Midodrine added here and limited UF.  He is slightly below his edw. Anemia of ESRD: Hgb 8.9. iron replete, on esa. Aranesp 60q q Thur -last on 3/20.  Increase dose next week if stlil here  Secondary HPTH: Ca/Phos ok - not on binders, on Vit D3. Nutrition: Alb low, continue protein supplements. Diarrhea: per primary, seems improved T2DM Dispo: Pending discharge date 3/27   Irena Cords, MD [employer] [phone]

## 2024-01-15 ENCOUNTER — Other Ambulatory Visit (HOSPITAL_COMMUNITY): Payer: Self-pay

## 2024-01-15 LAB — GLUCOSE, CAPILLARY: Glucose-Capillary: 100 mg/dL — ABNORMAL HIGH (ref 70–99)

## 2024-01-15 MED ORDER — ACETAMINOPHEN 10 MG/ML IV SOLN
INTRAVENOUS | Status: AC
  Filled 2024-01-15: qty 200

## 2024-01-15 MED ORDER — TIZANIDINE HCL 4 MG PO TABS
2.0000 mg | ORAL_TABLET | Freq: Once | ORAL | Status: AC
  Administered 2024-01-15: 2 mg via ORAL
  Filled 2024-01-15: qty 1

## 2024-01-15 MED ORDER — PHENYLEPHRINE HCL-NACL 20-0.9 MG/250ML-% IV SOLN
INTRAVENOUS | Status: AC
  Filled 2024-01-15: qty 750

## 2024-01-15 MED ORDER — PROPOFOL 1000 MG/100ML IV EMUL
INTRAVENOUS | Status: AC
  Filled 2024-01-15: qty 300

## 2024-01-15 MED ORDER — INSULIN PEN NEEDLE 32G X 4 MM MISC: 0 refills | Status: AC

## 2024-01-15 NOTE — Progress Notes (Signed)
 Inpatient Rehabilitation Care Coordinator Discharge Note   Patient Details  Name: Philip Benjamin MRN: [id number] Date of Birth: 09-20-1951   Discharge location: HOME WITH WIFE AND DAUGHTER-DAUGHTER IS THE MAIN CAREGIVER  Length of Stay: 23 DAYS  Discharge activity level: MIN-MOD LEVEL  Home/community participation: ACTIVE  Patient response YN:WGNFAO Literacy - How often do you need to have someone help you when you read instructions, pamphlets, or other written material from your doctor or pharmacy?: Never  Patient response ZH:YQMVHQ Isolation - How often do you feel lonely or isolated from those around you?: Never  Services provided included: MD, RD, PT, OT, CM, RN, TR, Pharmacy, Neuropsych, SW  Financial Services:  Field seismologist Utilized: [employer] offered to/list presented to: PT AND DAUGHTER  Follow-up services arranged:  Home Health, Patient/Family request agency HH/DME Home Health Agency: [facility]  PT  OT  RN  AIDE   HAS DME FROM PAST ADMISSIONS-WANTS TO WAIT ON HOSPITAL BED AND WILL LET WORKER KNOW IF NEEDED ONCE HOME   HH/DME Requested Agency: [facility]  Patient response to transportation need: Is the patient able to respond to transportation needs?: Yes In the past 12 months, has lack of transportation kept you from medical appointments or from getting medications?: No In the past 12 months, has lack of transportation kept you from meetings, work, or from getting things needed for daily living?: No   Patient/Family verbalized understanding of follow-up arrangements:  Yes  Individual responsible for coordination of the follow-up plan: SELF AND [name]-DAUGHTER [phone]  Confirmed correct DME delivered: Lucy Chris 01/15/2024    Comments (or additional information):DAUGHTER IS A RN AND FELT NO FAMILY EDUCATION WAS NEEDED. AWARE OF HIS CARE NEEDS.  Summary of Stay    Date/Time Discharge Planning CSW  01/14/24 0913 Home with wife  who has health issues-?dementia and daughter will be doing the majority of care. His brother will assist also. Question if medically ready for DC tomorrow RGD  01/07/24 0847 Daughter to provide the majority of care, wife has health issues of her own. Working on ramp for steps into home. Await team's recommedations RGD  12/31/23 2841 HOme with wife who has dementia and can not assist and daughter works three nights per week and son works days. Daughter was helping him dress prior to admission due to UE's function. Has used SNF days RGD  12/24/23 0927 New evaluation-home with wife who can not assist and daughter works nights and son works days. Between all can provide care. Await team's goals RGD       Satoya Feeley, Lemar Livings

## 2024-01-15 NOTE — Progress Notes (Signed)
 Contacted [facility] to confirm pt's d/c today. Clinic aware pt should resume care tomorrow. Today's renal note and attending note faxed to clinic for continuation of care. Clinic advised d/c summary not available at this time.   Olivia Canter Renal Navigator [phone]

## 2024-01-15 NOTE — Patient Care Conference (Signed)
 Inpatient RehabilitationTeam Conference and Plan of Care Update Date: 01/14/2024   Time: 11:11 AM    Patient Name: Philip Benjamin      Medical Record Number: [id number]  Date of Birth: Jul 13, 1951 Sex: Male         Room/Bed: 4W02C/4W02C-01 Payor Info: Payor: MEDICARE / Plan: MEDICARE PART A AND B / Product Type: *No Product type* /    Admit Date/Time:  12/23/2023 11:57 AM  Primary Diagnosis:  Left middle cerebral artery stroke [facility])  Hospital Problems: Principal Problem:   Left middle cerebral artery stroke [facility]) Active Problems:   Anxiety state    Expected Discharge Date: Expected Discharge Date: 01/15/24  Team Members Present: Physician leading conference: Dr. Sula Soda Social Worker Present: Dossie Der, LCSW Nurse Present: Chana Bode, RN PT Present: Wynelle Link, PT OT Present: Bretta Bang, OT SLP Present: Other (comment) Catha Nottingham, SLP) PPS Coordinator present : Fae Pippin, SLP     Current Status/Progress Goal Weekly Team Focus  Bowel/Bladder   Patient continent of bowel, last recorded [facility] 3/23. Patient has foley.   Patient remain continent.   Foley out.    Swallow/Nutrition/ Hydration               ADL's   Mod/max assist BADL   Mod assist   discharge planning    Mobility   minA bed mobility, CGA/minA sit<>stand to RW, minA squat pivot transfers, minA short distance gait 39ft with RW, modA for x4 stairs.   Goals downgraded to minA - wheelchair level recommended  DC planning, balance training, endurance training, stair training    Communication                Safety/Cognition/ Behavioral Observations               Pain   Patient reports pain in shoulder, PRN tylenol provided.   Manage pain appropriately.   Decrease pain.    Skin   Skin intact.  No skin breakdown.  Frequet repositioning to maintain skin integrity.      Discharge Planning:  Home with wife who has health issues-?dementia and daughter will be doing  the majority of care. His brother will assist also. Question if medically ready for DC tomorrow   Team Discussion: Patient post left MCA CVA with poor activity tolerance, spasticity, visual deficits, pedal edema, ESRD - HD, DM, off insulin has regressed since admit and goals have been downgraded.   Patient on target to meet rehab goals: Currently, goals are set for mod - max assist. Requires min assist for squat pivot and able to ambulate up to 10' but needs mod assist to manage steps. Scored a 9/56 on the BERG.   *See Care Plan and progress notes for long and short-term goals.   Revisions to Treatment Plan:  Palliative care consult Podiatry consult Downgraded goals   Teaching Needs: Safety, medications, transfers, toileting, etc.   Current Barriers to Discharge: Decreased caregiver support, Home enviroment access/layout, and Hemodialysis  Possible Resolutions to Barriers: Family education declined by daughter Ramp for entry to the home recommended DME: hospital bed     Medical Summary Current Status: urinary retention, type 2 diabetes, diarrhea, anemia, constipation, ESRD, generalized weakness    Barriers to Discharge Comments: urinary retention, type 2 diabetes, diarrhea, anemia, constipation, ESRD, generalized weakness Possible Resolutions to [employer] Focus: continue foley, referred for outpatient urology follow-up, wean off insulin, continue imodium prn, Hgb reviewed and improved, colace one time today, continue HD, decrease tizanidine  to HS   Continued Need for Acute Rehabilitation Level of Care: The patient requires daily medical management by a physician with specialized training in physical medicine and rehabilitation for the following reasons: Direction of a multidisciplinary physical rehabilitation program to maximize functional independence : Yes Medical management of patient stability for increased activity during participation in an intensive rehabilitation  regime.: Yes Analysis of laboratory values and/or radiology reports with any subsequent need for medication adjustment and/or medical intervention. : Yes   I attest that I was present, lead the team conference, and concur with the assessment and plan of the team.   Chana Bode B 01/15/2024, 7:38 AM

## 2024-01-15 NOTE — Plan of Care (Signed)
  Problem: Consults Goal: RH STROKE PATIENT EDUCATION Description: See Patient Education module for education specifics  Outcome: Progressing   Problem: RH BOWEL ELIMINATION Goal: RH STG MANAGE BOWEL WITH ASSISTANCE Description: STG Manage Bowel with mod I Assistance. Outcome: Progressing   Problem: RH BLADDER ELIMINATION Goal: RH STG MANAGE BLADDER WITH ASSISTANCE Description: STG Manage Bladder With min Assistance Outcome: Progressing   Problem: RH SAFETY Goal: RH STG ADHERE TO SAFETY PRECAUTIONS W/ASSISTANCE/DEVICE Description: STG Adhere to Safety Precautions With cues  Assistance/Device. Outcome: Progressing   Problem: RH PAIN MANAGEMENT Goal: RH STG PAIN MANAGED AT OR BELOW PT'S PAIN GOAL Description: < 4 with prns Outcome: Progressing   Problem: RH KNOWLEDGE DEFICIT Goal: RH STG INCREASE KNOWLEDGE OF DIABETES Description: Patient and family will be able to manage DM using educational resources for medications and dietary modification independently Outcome: Progressing Goal: RH STG INCREASE KNOWLEDGE OF HYPERTENSION Description: Patient and family will be able to manage HTN using educational resources for medications and dietary modification independently Outcome: Progressing Goal: RH STG INCREASE KNOWLEGDE OF HYPERLIPIDEMIA Description: Patient and family will be able to manage HLD using educational resources for medications and dietary modification independently Outcome: Progressing Goal: RH STG INCREASE KNOWLEDGE OF STROKE PROPHYLAXIS Description: Patient and family will be able to manage secondary risks using educational resources for medications and dietary modification independently Outcome: Progressing

## 2024-01-15 NOTE — Progress Notes (Signed)
 PROGRESS NOTE   Subjective/Complaints: No new complaints this morning Discussed that CBGs continue to be well controlled and he does not need Lantus Discussed that PA would review medications   ROS: +diarrhea, +malaise, +spasticity   Objective:   No results found.  No results for input(s): "WBC", "HGB", "HCT", "PLT" in the last 72 hours.    No results for input(s): "NA", "K", "CL", "CO2", "GLUCOSE", "BUN", "CREATININE", "CALCIUM" in the last 72 hours.     Intake/Output Summary (Last 24 hours) at 01/15/2024 1010 Last data filed at 01/15/2024 0900 Gross per 24 hour  Intake 480 ml  Output 2100 ml  Net -1620 ml         Physical Exam: Vital Signs Blood pressure (!) 129/59, pulse 72, temperature 98.3 F (36.8 C), temperature source Oral, resp. rate 17, height 6\' 1"  (1.854 m), weight 85.4 kg, SpO2 99%.  Constitutional: No distress . Vital signs reviewed. HEENT: NCAT, EOMI, oral membranes moist Neck: supple Cardiovascular: Bradycardia Respiratory/Chest: CTA Bilaterally without wheezes or rales. Normal effort    GI/Abdomen: BS +, non-tender, non-distended Ext: no clubbing, cyanosis, or edema Psych: pleasant and cooperative  Neuro -     Comments: RUE- biceps 4+/5, Triceps 4+/5; WE 5-/5; Grip 4/5; FA 4-/5 LUE- Biceps, triceps 5-/5; WE 5-/5; Grip 4+/5; FA 4/5 RLE- HF 4-/5; otherwise 4/5 rest of RLE LLE- 4+/5 throughout-  Skin:    General: Skin is warm and dry.     Comments: R chest HD catheter present  Neurological:     Mental Status: He is oriented to person, place, and time.     Comments: fair insight and awareness. Normal language.  MAS of 2 in RLE- few beats clonus B/L LE's and MAS of 1+ in LLE 3+ DTRs B/L patella, stable 3/27      Assessment/Plan: 1. Functional deficits which require 3+ hours per day of interdisciplinary therapy in a comprehensive inpatient rehab setting. Physiatrist is providing close  team supervision and 24 hour management of active medical problems listed below. Physiatrist and rehab team continue to assess barriers to discharge/monitor patient progress toward functional and medical goals  Care Tool:  Bathing    Body parts bathed by patient: Right arm, Chest, Abdomen, Front perineal area, Right upper leg, Left upper leg, Face, Right lower leg, Left lower leg   Body parts bathed by helper: Left arm, Buttocks Body parts n/a: Left arm, Buttocks, Left lower leg, Right lower leg   Bathing assist Assist Level: Minimal Assistance - Patient > 75%     Upper Body Dressing/Undressing Upper body dressing   What is the patient wearing?: Pull over shirt, Button up shirt    Upper body assist Assist Level: Minimal Assistance - Patient > 75%    Lower Body Dressing/Undressing Lower body dressing      What is the patient wearing?: Underwear/pull up, Pants     Lower body assist Assist for lower body dressing: Maximal Assistance - Patient 25 - 49%     Toileting Toileting    Toileting assist Assist for toileting: Moderate Assistance - Patient 50 - 74%     Transfers Chair/bed transfer  Transfers assist  Chair/bed transfer assist level: Contact Guard/Touching assist     Locomotion Ambulation   Ambulation assist      Assist level: Minimal Assistance - Patient > 75% Assistive device: Walker-rolling Max distance: 12ft   Walk 10 feet activity   Assist     Assist level: Minimal Assistance - Patient > 75% Assistive device: Walker-rolling   Walk 50 feet activity   Assist Walk 50 feet with 2 turns activity did not occur: Safety/medical concerns         Walk 150 feet activity   Assist Walk 150 feet activity did not occur: Safety/medical concerns         Walk 10 feet on uneven surface  activity   Assist Walk 10 feet on uneven surfaces activity did not occur: Safety/medical concerns         Wheelchair     Assist Is the patient  using a wheelchair?: Yes Type of Wheelchair: Manual    Wheelchair assist level: Supervision/Verbal cueing Max wheelchair distance: 150'    Wheelchair 50 feet with 2 turns activity    Assist        Assist Level: Supervision/Verbal cueing   Wheelchair 150 feet activity     Assist      Assist Level: Supervision/Verbal cueing   Blood pressure (!) 129/59, pulse 72, temperature 98.3 F (36.8 C), temperature source Oral, resp. rate 17, height 6\' 1"  (1.854 m), weight 85.4 kg, SpO2 99%.  Medical Problem List and Plan: 1. 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  Bleeding during catheterization/worsening anemia: decrease Heparin to q12H, SCDs ordered             -antiplatelet therapy: Aspirin 81 mg daily 3. Pain Management: Tylenol as needed   4. Situational depression/sleep:  Neuropsych consulted. Offered chaplain consult but patient defers. Prozac 20 mg daily, Xanax 0.25 mg 3 times daily as needed  3/23 -pt having difficulty sleeping--will order prn melatonin. Asked him to let his nurse know if he's having problems falling asleep. Avoid excessive daytime naps as well.              -antipsychotic agents: N/A 5. Neuropsych/cognition: This patient is capable of making decisions on his own behalf. 6. Skin/Wound Care: Routine skin checks 7. Fluids/Electrolytes/Nutrition: Routine in and outs with follow-up chemistries 8.  End-stage renal disease.  Hemodialysis per renal services, continue  9.  Type 2 diabetes mellitus, hemoglobin A1c 5.6. d/c lantus.  D/c feeding supplement  CBG (last 3)  Recent Labs    01/14/24 1843 01/14/24 2107 01/15/24 0658  GLUCAP 137* 122* 100*    10.  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  Discussed with Dr. Wynetta Emery neurosurgery placed on trial of  steroids.  No current intervention at this time.  Needs to f/u with NSU after d/c.   11.  Anemia of chronic disease.  Follow-up CBC  12.  Glaucoma.  Legally blind left eye.  Continue eyedrops  13.  BPH. D/c flomax temporarily given hypotension  14.  Hyperlipidemia.  continue Lipitor  15.  Hypotension: decrease tizanidine to HS  16.  Recent cholecystitis.  Status post cholecystostomy tube placement drainage of abscess 12/16.  Follow-up outpatient  17.  Constipation: colace ordered x1  18. 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 Bradycardia: medications reviewed and he is not on any heart rate lowering agents  resolved  20. Poor intake: recommended eating fruits since these are filled with fiber and will bulk his stool  21. Leukocytosis: resolved, continue keflex  22. Decreased urinary output: UA/UC ordered, discussed UA is positive, keflex started, f/u UC, greater than 100,000 Klebsiella pneumoniae,S to keflex, still symptomatic after 3 d abx, suspect bladder spasms will add antispasmodic low dose monitor for retention  23. Generalized weakness:CVA + SCI + debility, continue D3 3,000U daily  24. Decreased urination: discussed this could be 2/2 UTI or HD, see below  25. Bladder cramps: resolved, complete course of Bactrim  26. Anemia: Hgb reviewed and has improved, decreased heparin to daily  27. Urinary retention: foley placed by urology, continue until outpatient follow-up, discussed that daughter is a nurse and is accustomed to foley care, discussed that he should have f/u with urology 1 week after discharge  28. Hypotension: d/c flomax since foley is now inserted and hypotension could be contributing to weakness, continue tizanidine, discussed with patient, decrease tizanidine to HS, continue  29. Diabetic retinopathy: will help to schedule outpatient ophthalmology f/u   >30 minutes spent in discharge of patient including review of  medications and follow-up appointments, physical examination, and in answering all patient's questions    LOS: 23 days A FACE TO FACE EVALUATION WAS PERFORMED  Philip Benjamin P Fount Bahe 01/15/2024, 10:10 AM

## 2024-01-15 NOTE — Progress Notes (Signed)
 Patient ID: Philip Benjamin, male   DOB: February 04, 1951, 73 y.o.   MRN: [id number] S: NO complaints, for discharge today O:BP (!) 129/59 (BP Location: Right Arm)   Pulse 72   Temp 98.3 F (36.8 C) (Oral)   Resp 17   Ht 6\' 1"  (1.854 m)   Wt 85.4 kg   SpO2 99%   BMI 24.84 kg/m   Intake/Output Summary (Last 24 hours) at 01/15/2024 1016 Last data filed at 01/15/2024 0900 Gross per 24 hour  Intake 480 ml  Output 2100 ml  Net -1620 ml   Intake/Output: I/O last 3 completed shifts: In: 717 [P.O.:717] Out: 2525 [Urine:1525; Other:1000]  Intake/Output this shift:  Total I/O In: -  Out: 500 [Urine:500] Weight change: 0.4 kg Gen: NAD CVS: RRR Resp:CTA Abd: +BS, soft, TN/ND Ext: no edema, LUE AVF +T/B  Recent Labs  Lab 01/12/24 0530  NA 134*  K 4.0  CL 101  CO2 21*  GLUCOSE 64*  BUN 31*  CREATININE 3.47*  ALBUMIN 2.6*  CALCIUM 9.0  PHOS 3.7   Liver Function Tests: Recent Labs  Lab 01/12/24 0530  ALBUMIN 2.6*   No results for input(s): "LIPASE", "AMYLASE" in the last 168 hours. No results for input(s): "AMMONIA" in the last 168 hours. CBC: Recent Labs  Lab 01/12/24 0530  WBC 7.2  HGB 8.9*  HCT 27.5*  MCV 92.9  PLT 192   Cardiac Enzymes: No results for input(s): "CKTOTAL", "CKMB", "CKMBINDEX", "TROPONINI" in the last 168 hours. CBG: Recent Labs  Lab 01/14/24 0624 01/14/24 1159 01/14/24 1843 01/14/24 2107 01/15/24 0658  GLUCAP 93 102* 137* 122* 100*    Iron Studies: No results for input(s): "IRON", "TIBC", "TRANSFERRIN", "FERRITIN" in the last 72 hours. Studies/Results: No results found.  aspirin  81 mg Oral Daily   atorvastatin  40 mg Oral Daily   Chlorhexidine Gluconate Cloth  6 each Topical Q12H   cholecalciferol  3,000 Units Oral Daily   cycloSPORINE  1 drop Both Eyes BID   darbepoetin (ARANESP) injection - DIALYSIS  60 mcg Subcutaneous Q Thu-1800   dorzolamide  1 drop Both Eyes BID   And   timolol  1 drop Both Eyes BID   FLUoxetine  20 mg  Oral Daily   heparin  5,000 Units Subcutaneous Daily   latanoprost  1 drop Left Eye QHS   midodrine  5 mg Oral Q M,W,F-HD   multivitamin  1 tablet Oral QHS   tiZANidine  2 mg Oral QHS    BMET    Component Value Date/Time   NA 134 (L) 01/12/2024 0530   K 4.0 01/12/2024 0530   CL 101 01/12/2024 0530   CO2 21 (L) 01/12/2024 0530   GLUCOSE 64 (L) 01/12/2024 0530   BUN 31 (H) 01/12/2024 0530   CREATININE 3.47 (H) 01/12/2024 0530   CALCIUM 9.0 01/12/2024 0530   GFRNONAA 18 (L) 01/12/2024 0530   GFRAA 44 (L) 01/14/2018 1112   CBC    Component Value Date/Time   WBC 7.2 01/12/2024 0530   RBC 2.96 (L) 01/12/2024 0530   HGB 8.9 (L) 01/12/2024 0530   HCT 27.5 (L) 01/12/2024 0530   PLT 192 01/12/2024 0530   MCV 92.9 01/12/2024 0530   MCH 30.1 01/12/2024 0530   MCHC 32.4 01/12/2024 0530   RDW 12.0 01/12/2024 0530   LYMPHSABS 0.8 01/08/2024 0549   MONOABS 1.0 01/08/2024 0549   EOSABS 0.2 01/08/2024 0549   BASOSABS 0.0 01/08/2024 0549    Dialysis  Orders: MWF - [facility] 4hr, 350/500, EDW 86.5kg, 2K/2.5Ca, TDC, heparin 1000U/hr - Venofer 50mg  IV q Monday - Mircera IV q 4 weeks (last 2/17)   Assessment/Plan: ESRD: Continue HD on MWF schedule. 1st stage BB AVF creation with [facility] on 3/14.  He will go to his outpatient unit tomorrow. 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 Hx cholecystitis 09/2023: S/p IR chole tube, now removed. Urinary retention/bladder spasms - s/p Foley. Per urology.   HTN/volume: Blood pressures on the soft side. Midodrine added here and limited UF.  He is slightly below his edw. Anemia of ESRD: Hgb 8.9. iron replete, on esa. Aranesp 60q q Thur -last on 3/20. Increase dose next week if stlil here  Secondary HPTH: Ca/Phos ok - not on binders, on Vit D3. Nutrition: Alb low, continue protein supplements. Diarrhea: per primary, seems improved T2DM Dispo:  Pending discharge date 3/27   Irena Cords, MD [facility]

## 2024-01-18 DIAGNOSIS — N401 Enlarged prostate with lower urinary tract symptoms: Secondary | ICD-10-CM | POA: Insufficient documentation

## 2024-01-18 DIAGNOSIS — Z8701 Personal history of pneumonia (recurrent): Secondary | ICD-10-CM | POA: Insufficient documentation

## 2024-01-18 DIAGNOSIS — I69351 Hemiplegia and hemiparesis following cerebral infarction affecting right dominant side: Secondary | ICD-10-CM | POA: Insufficient documentation

## 2024-01-18 DIAGNOSIS — K5900 Constipation, unspecified: Secondary | ICD-10-CM | POA: Insufficient documentation

## 2024-01-18 DIAGNOSIS — H548 Legal blindness, as defined in USA: Secondary | ICD-10-CM | POA: Insufficient documentation

## 2024-01-18 DIAGNOSIS — Z992 Dependence on renal dialysis: Secondary | ICD-10-CM | POA: Insufficient documentation

## 2024-01-18 DIAGNOSIS — Z87440 Personal history of urinary (tract) infections: Secondary | ICD-10-CM | POA: Insufficient documentation

## 2024-01-18 DIAGNOSIS — G9520 Unspecified cord compression: Secondary | ICD-10-CM | POA: Insufficient documentation

## 2024-01-18 DIAGNOSIS — F4321 Adjustment disorder with depressed mood: Secondary | ICD-10-CM | POA: Insufficient documentation

## 2024-01-18 DIAGNOSIS — M4802 Spinal stenosis, cervical region: Secondary | ICD-10-CM | POA: Insufficient documentation

## 2024-01-18 DIAGNOSIS — Z8616 Personal history of COVID-19: Secondary | ICD-10-CM | POA: Insufficient documentation

## 2024-01-18 DIAGNOSIS — E11319 Type 2 diabetes mellitus with unspecified diabetic retinopathy without macular edema: Secondary | ICD-10-CM | POA: Insufficient documentation

## 2024-01-19 ENCOUNTER — Encounter: Payer: Self-pay | Admitting: Oncology

## 2024-01-19 ENCOUNTER — Telehealth: Payer: Self-pay | Admitting: *Deleted

## 2024-01-19 NOTE — Telephone Encounter (Signed)
 Transitional Care call--I spoke with his daughter Wallace Cullens    Are you/is patient experiencing any problems since coming home? Are there any questions regarding any aspect of care? NO Are there any questions regarding medications administration/dosing? Are meds being taken as prescribed? Patient should review meds with caller to confirm NO all medications were received Have there been any falls? NO Has Home Health been to the house and/or have they contacted you? If not, have you tried to contact them? Can we help you contact them? YES they have made contact and appts scheduled Are bowels and bladder emptying properly? Are there any unexpected incontinence issues? If applicable, is patient following bowel/bladder programs? NO- pt is on dialysis and is there currently Any fevers, problems with breathing, unexpected pain?NO Are there any skin problems or new areas of breakdown?NO Has the patient/family member arranged specialty MD follow up (ie cardiology/neurology/renal/surgical/etc)?  Can we help arrange? Appt discussed for 01/27/24 with Dr Carlis Abbott Does the patient need any other services or support that we can help arrange? NO Are caregivers following through as expected in assisting the patient?YES Has the patient quit smoking, drinking alcohol, or using drugs as recommended?YES  Appointment 01/27/24 @3pm  arrive by 2:20 to see Dr Carlis Abbott [address] [phone]

## 2024-01-27 ENCOUNTER — Encounter: Admitting: Physical Medicine and Rehabilitation

## 2024-01-29 ENCOUNTER — Telehealth: Payer: Self-pay | Admitting: *Deleted

## 2024-01-29 ENCOUNTER — Inpatient Hospital Stay: Attending: Oncology

## 2024-01-29 ENCOUNTER — Inpatient Hospital Stay (HOSPITAL_BASED_OUTPATIENT_CLINIC_OR_DEPARTMENT_OTHER): Admitting: Oncology

## 2024-01-29 ENCOUNTER — Ambulatory Visit

## 2024-01-29 VITALS — BP 144/84 | HR 72 | Temp 98.0°F | Resp 16

## 2024-01-29 DIAGNOSIS — Z79899 Other long term (current) drug therapy: Secondary | ICD-10-CM | POA: Diagnosis not present

## 2024-01-29 DIAGNOSIS — N186 End stage renal disease: Secondary | ICD-10-CM | POA: Insufficient documentation

## 2024-01-29 DIAGNOSIS — D631 Anemia in chronic kidney disease: Secondary | ICD-10-CM | POA: Insufficient documentation

## 2024-01-29 DIAGNOSIS — Z992 Dependence on renal dialysis: Secondary | ICD-10-CM | POA: Diagnosis not present

## 2024-01-29 LAB — CBC WITH DIFFERENTIAL/PLATELET
Abs Immature Granulocytes: 0.02 10*3/uL (ref 0.00–0.07)
Basophils Absolute: 0.1 10*3/uL (ref 0.0–0.1)
Basophils Relative: 1 %
Eosinophils Absolute: 0.2 10*3/uL (ref 0.0–0.5)
Eosinophils Relative: 4 %
HCT: 31.8 % — ABNORMAL LOW (ref 39.0–52.0)
Hemoglobin: 10 g/dL — ABNORMAL LOW (ref 13.0–17.0)
Immature Granulocytes: 0 %
Lymphocytes Relative: 15 %
Lymphs Abs: 0.9 10*3/uL (ref 0.7–4.0)
MCH: 30.2 pg (ref 26.0–34.0)
MCHC: 31.4 g/dL (ref 30.0–36.0)
MCV: 96.1 fL (ref 80.0–100.0)
Monocytes Absolute: 0.6 10*3/uL (ref 0.1–1.0)
Monocytes Relative: 11 %
Neutro Abs: 4 10*3/uL (ref 1.7–7.7)
Neutrophils Relative %: 69 %
Platelets: 215 10*3/uL (ref 150–400)
RBC: 3.31 MIL/uL — ABNORMAL LOW (ref 4.22–5.81)
RDW: 12.1 % (ref 11.5–15.5)
WBC: 5.8 10*3/uL (ref 4.0–10.5)
nRBC: 0 % (ref 0.0–0.2)

## 2024-01-29 LAB — COMPREHENSIVE METABOLIC PANEL WITH GFR
ALT: 46 U/L — ABNORMAL HIGH (ref 0–44)
AST: 47 U/L — ABNORMAL HIGH (ref 15–41)
Albumin: 3.1 g/dL — ABNORMAL LOW (ref 3.5–5.0)
Alkaline Phosphatase: 122 U/L (ref 38–126)
Anion gap: 10 (ref 5–15)
BUN: 23 mg/dL (ref 8–23)
CO2: 26 mmol/L (ref 22–32)
Calcium: 9 mg/dL (ref 8.9–10.3)
Chloride: 99 mmol/L (ref 98–111)
Creatinine, Ser: 2.71 mg/dL — ABNORMAL HIGH (ref 0.61–1.24)
GFR, Estimated: 24 mL/min — ABNORMAL LOW (ref >60)
Glucose, Bld: 204 mg/dL — ABNORMAL HIGH (ref 70–99)
Potassium: 3.6 mmol/L (ref 3.5–5.1)
Sodium: 135 mmol/L (ref 135–145)
Total Bilirubin: 0.6 mg/dL (ref 0.0–1.2)
Total Protein: 6.5 g/dL (ref 6.5–8.1)

## 2024-01-29 LAB — IRON AND TIBC
Iron: 72 ug/dL (ref 45–182)
Saturation Ratios: 25 % (ref 17.9–39.5)
TIBC: 291 ug/dL (ref 250–450)
UIBC: 219 ug/dL

## 2024-01-29 LAB — FERRITIN: Ferritin: 284 ng/mL (ref 24–336)

## 2024-01-29 LAB — FOLATE: Folate: >40 ng/mL (ref >5.9)

## 2024-01-29 LAB — VITAMIN B12: Vitamin B-12: 922 pg/mL — ABNORMAL HIGH (ref 180–914)

## 2024-01-29 NOTE — Telephone Encounter (Signed)
 Verified dose of Mircera with [facility] and added to medication list.

## 2024-01-29 NOTE — Patient Instructions (Signed)
 VISIT SUMMARY:  You have a history of kidney disease requiring dialysis and recently experienced a stroke that affected your emotions and cognition. You were hospitalized, then transferred to a rehabilitation facility, and have been home since February 27. Your family noticed you were getting weaker before your hospitalization, which raised concerns about another stroke. You are currently receiving dialysis at [facility] and have been feeling generally well, except for some tiredness after dialysis. Your hemoglobin levels have been stable.  YOUR PLAN:  -CHRONIC KIDNEY DISEASE ON DIALYSIS: Chronic kidney disease means your kidneys are not working well, and you need dialysis to help clean your blood. You will continue to receive dialysis three times a week at [facility], and it is important to coordinate your care with your kidney specialist (nephrologist).  -ANEMIA: Anemia means you have a lower than normal number of red blood cells. Your hemoglobin levels are stable at 10, which is good. You will continue to receive injections that help your body make more red blood cells during your dialysis sessions. We will keep monitoring your hemoglobin levels and inform the dialysis center of any changes in your treatment.  INSTRUCTIONS:  Please continue your dialysis sessions at [facility] and coordinate with your nephrologist. Keep monitoring your hemoglobin levels and inform the dialysis center of any changes in your treatment.

## 2024-01-29 NOTE — Progress Notes (Signed)
  Cancer Center at [facility]  HEMATOLOGY FOLLOW-UP VISIT  [name], MD  REASON FOR FOLLOW-UP: Anemia of chronic kidney disease  ASSESSMENT & PLAN:  Patient is a 73 y.o. male following for anemia of chronic kidney disease  Anemia in chronic kidney disease Anemia secondary to CKD with hemoglobin of 10 today.  Patient is currently receiving Mircera 90 mcg every 2 weeks.  Iron levels are adequate and patient is receiving iron during dialysis.   - Continue iron infusions and Mircera at the dialysis center  Return to clinic in 3 months with labs.  If the labs are stable and patient is able to continue with the dialysis center, will discharge from hematology clinic at that time  ESRD (end stage renal disease) on dialysis [name]) Patient was recently started on dialysis at [facility] dialysis center in [place].  -Continue to follow with nephrology   Orders Placed This Encounter  Procedures   Ferritin    Standing Status:   Future    Expected Date:   04/26/2024    Expiration Date:   01/30/2025   Vitamin B12    Standing Status:   Future    Expected Date:   04/26/2024    Expiration Date:   01/30/2025   Folate    Standing Status:   Future    Expected Date:   04/26/2024    Expiration Date:   01/30/2025   CBC with Differential/Platelet    Standing Status:   Future    Expected Date:   04/26/2024    Expiration Date:   01/30/2025   Comprehensive metabolic panel with GFR    Standing Status:   Future    Expected Date:   04/26/2024    Expiration Date:   01/30/2025   Iron and TIBC    Standing Status:   Future    Expected Date:   04/26/2024    Expiration Date:   01/30/2025    The total time spent in the appointment was 20 minutes encounter with patients including review of chart and various tests results, discussions about plan of care and coordination of care plan   All questions were answered. The patient knows to call the clinic with any problems, questions or concerns. No barriers  to learning was detected.  [name], MD 4/12/202510:37 AM   SUMMARY OF HEMATOLOGIC HISTORY: Anemia of chronic kidney disease- on dialysis - On mircera 90mcg every 2 weeks - Receives IV Iron with dialysis - Increased free light chains with normal ratio - Iron panel within normal limits   INTERVAL HISTORY: Philip Benjamin 73 y.o. male following for anemia of chronic kidney disease on dialysis. Patient was accompanied by his daughter today.  Patient had a recent stroke and was diagnosed with spinal and cervical stenosis.  Patient was at a rehab facility when he saw me last time but is not discharged on his at home.  Patient reports doing well and is improving gradually in his functional status.  He has no complaints today.  I have reviewed the past medical history, past surgical history, social history and family history with the patient   ALLERGIES:  has no known allergies.  MEDICATIONS:  Current Outpatient Medications  Medication Sig Dispense Refill   acetaminophen (TYLENOL) 650 MG CR tablet Take 650 mg by mouth every 6 (six) hours as needed for pain.     atorvastatin (LIPITOR) 40 MG tablet Take 1 tablet (40 mg total) by mouth daily. 30 tablet 0  cycloSPORINE (RESTASIS) 0.05 % ophthalmic emulsion SMARTSIG:In Eye(s)     Dorzolamide HCl-Timolol Mal PF 2-0.5 % SOLN Apply 1 drop to eye 2 (two) times daily.     FLUoxetine (PROZAC) 20 MG capsule Take by mouth daily. 30 capsule 0   folic acid (FOLVITE) 800 MCG tablet Take 1 tablet (800 mcg total) by mouth daily. 30 tablet 0   insulin glargine (LANTUS) 100 UNIT/ML Solostar Pen Inject 9 Units into the skin daily. 15 mL 11   Insulin Pen Needle 32G X 4 MM MISC Use as directed with insulin daily 100 each 0   Insulin Pen Needle 32G X 4 MM MISC Use as directed with insulin daily 100 each 0   latanoprost (XALATAN) 0.005 % ophthalmic solution Place 1 drop into both eyes at bedtime. (Patient taking differently: Place 1 drop into the left eye  at bedtime.)     midodrine (PROAMATINE) 5 MG tablet Take 1 tablet (5 mg total) by mouth every Monday, Wednesday, and Friday with hemodialysis. 30 tablet 0   multivitamin (RENA-VIT) TABS tablet Take 1 tablet by mouth at bedtime. 30 tablet 0   tiZANidine (ZANAFLEX) 2 MG tablet Take 1 tablet (2 mg total) by mouth at bedtime. 30 tablet 0   vitamin D3 (CHOLECALCIFEROL) 25 MCG tablet Take 3 tablets (3,000 Units total) by mouth daily. 90 tablet 0   ALPRAZolam (XANAX) 0.25 MG tablet Take 1 tablet (0.25 mg total) by mouth 3 (three) times daily as needed for anxiety. (Patient not taking: Reported on 01/29/2024) 10 tablet 0   loperamide (IMODIUM) 2 MG capsule Take 1 capsule (2 mg total) by mouth as needed for diarrhea or loose stools. (Patient not taking: Reported on 01/29/2024) 30 capsule 0   Methoxy PEG-Epoetin Beta (MIRCERA) 100 MCG/0.3ML SOSY Inject 90 mcg as directed every 14 (fourteen) days.     No current facility-administered medications for this visit.     REVIEW OF SYSTEMS:   Constitutional: Denies fevers, chills or night sweats Eyes: Denies blurriness of vision Ears, nose, mouth, throat, and face: Denies mucositis or sore throat Respiratory: Denies cough, dyspnea or wheezes Cardiovascular: Denies palpitation, chest discomfort or lower extremity swelling Gastrointestinal:  Denies nausea, heartburn or change in bowel habits Skin: Denies abnormal skin rashes Lymphatics: Denies new lymphadenopathy or easy bruising Neurological:Denies numbness, tingling or new weaknesses Behavioral/Psych: Mood is stable, no new changes  All other systems were reviewed with the patient and are negative.  PHYSICAL EXAMINATION:   Vitals:   01/29/24 1245  BP: (!) 144/84  Pulse: 72  Resp: 16  Temp: 98 F (36.7 C)  SpO2: 100%    GENERAL:alert, no distress and comfortable SKIN: skin color, texture, turgor are normal, no rashes or significant lesions LYMPH:  no palpable lymphadenopathy in the cervical,  axillary or inguinal LUNGS: clear to auscultation and percussion with normal breathing effort HEART: regular rate & rhythm and no murmurs and no lower extremity edema ABDOMEN:abdomen soft, non-tender and normal bowel sounds Musculoskeletal:no cyanosis of digits and no clubbing  NEURO: alert & oriented x 3 with fluent speech  LABORATORY DATA:  I have reviewed the data as listed  Lab Results  Component Value Date   WBC 5.8 01/29/2024   NEUTROABS 4.0 01/29/2024   HGB 10.0 (L) 01/29/2024   HCT 31.8 (L) 01/29/2024   MCV 96.1 01/29/2024   PLT 215 01/29/2024       Chemistry      Component Value Date/Time   NA 135 01/29/2024 1210  K 3.6 01/29/2024 1210   CL 99 01/29/2024 1210   CO2 26 01/29/2024 1210   BUN 23 01/29/2024 1210   CREATININE 2.71 (H) 01/29/2024 1210      Component Value Date/Time   CALCIUM 9.0 01/29/2024 1210   ALKPHOS 122 01/29/2024 1210   AST 47 (H) 01/29/2024 1210   ALT 46 (H) 01/29/2024 1210   BILITOT 0.6 01/29/2024 1210

## 2024-01-30 LAB — KAPPA/LAMBDA LIGHT CHAINS
Kappa free light chain: 82.7 mg/L — ABNORMAL HIGH (ref 3.3–19.4)
Kappa, lambda light chain ratio: 0.69 (ref 0.26–1.65)
Lambda free light chains: 120 mg/L — ABNORMAL HIGH (ref 5.7–26.3)

## 2024-01-31 ENCOUNTER — Encounter: Payer: Self-pay | Admitting: Oncology

## 2024-01-31 NOTE — Assessment & Plan Note (Signed)
 Patient was recently started on dialysis at [facility] dialysis center in [place]. -Continue to follow with nephrology

## 2024-01-31 NOTE — Assessment & Plan Note (Signed)
 Anemia secondary to CKD with hemoglobin of 10 today.  Patient is currently receiving Mircera 90 mcg every 2 weeks.  Iron levels are adequate and patient is receiving iron during dialysis.   - Continue iron infusions and Mircera at the dialysis center  Return to clinic in 3 months with labs.  If the labs are stable and patient is able to continue with the dialysis center, will discharge from hematology clinic at that time

## 2024-02-01 LAB — MULTIPLE MYELOMA PANEL, SERUM
Albumin SerPl Elph-Mcnc: 3.1 g/dL (ref 2.9–4.4)
Albumin/Glob SerPl: 1.1 (ref 0.7–1.7)
Alpha 1: 0.3 g/dL (ref 0.0–0.4)
Alpha2 Glob SerPl Elph-Mcnc: 0.7 g/dL (ref 0.4–1.0)
B-Globulin SerPl Elph-Mcnc: 0.9 g/dL (ref 0.7–1.3)
Gamma Glob SerPl Elph-Mcnc: 1 g/dL (ref 0.4–1.8)
Globulin, Total: 2.9 g/dL (ref 2.2–3.9)
IgA: 187 mg/dL (ref 61–437)
IgG (Immunoglobin G), Serum: 1170 mg/dL (ref 603–1613)
IgM (Immunoglobulin M), Srm: 65 mg/dL (ref 15–143)
Total Protein ELP: 6 g/dL (ref 6.0–8.5)

## 2024-02-03 ENCOUNTER — Telehealth: Payer: Self-pay | Admitting: Physical Medicine and Rehabilitation

## 2024-02-03 NOTE — Telephone Encounter (Signed)
 Not sure if her name is correct--but it sounded like Vishriam--OT with [facility] needs verbal orders for OT 1w7.  Please call her at [phone].

## 2024-02-03 NOTE — Telephone Encounter (Signed)
 Approval given

## 2024-02-18 ENCOUNTER — Other Ambulatory Visit: Payer: Self-pay

## 2024-02-18 DIAGNOSIS — N186 End stage renal disease: Secondary | ICD-10-CM

## 2024-02-23 ENCOUNTER — Ambulatory Visit (HOSPITAL_COMMUNITY)
Admission: RE | Admit: 2024-02-23 | Discharge: 2024-02-23 | Disposition: A | Discharge status: Home / Self Care | Source: Ambulatory Visit | Attending: Surgery | Admitting: Surgery

## 2024-02-23 ENCOUNTER — Ambulatory Visit: Attending: Surgery | Admitting: Physician Assistant

## 2024-02-23 VITALS — BP 168/72 | HR 63

## 2024-02-23 DIAGNOSIS — Z992 Dependence on renal dialysis: Secondary | ICD-10-CM

## 2024-02-23 DIAGNOSIS — N186 End stage renal disease: Secondary | ICD-10-CM | POA: Diagnosis present

## 2024-02-23 NOTE — Progress Notes (Signed)
 POST OPERATIVE OFFICE NOTE    CC:  F/u for surgery  HPI:  This is a 73 y.o. male who is s/p left 1st stage BVT on 01/02/2024 by Dr. Charlotte Cookey   Pt states he does not have pain/numbness in the left hand.  His catheter was placed by IR 08/15/2023 and is working well.   The pt is on dialysis M/W/F at [facility] location in [place].     No Known Allergies  Current Outpatient Medications  Medication Sig Dispense Refill   acetaminophen  (TYLENOL ) 650 MG CR tablet Take 650 mg by mouth every 6 (six) hours as needed for pain.     ALPRAZolam  (XANAX ) 0.25 MG tablet Take 1 tablet (0.25 mg total) by mouth 3 (three) times daily as needed for anxiety. (Patient not taking: Reported on 01/29/2024) 10 tablet 0   atorvastatin  (LIPITOR) 40 MG tablet Take 1 tablet (40 mg total) by mouth daily. 30 tablet 0   cycloSPORINE  (RESTASIS ) 0.05 % ophthalmic emulsion SMARTSIG:In Eye(s)     Dorzolamide  HCl-Timolol  Mal PF 2-0.5 % SOLN Apply 1 drop to eye 2 (two) times daily.     folic acid  (FOLVITE ) 800 MCG tablet Take 1 tablet (800 mcg total) by mouth daily. 30 tablet 0   insulin  glargine (LANTUS ) 100 UNIT/ML Solostar Pen Inject 9 Units into the skin daily. 15 mL 11   Insulin  Pen Needle 32G X 4 MM MISC Use as directed with insulin  daily 100 each 0   Insulin  Pen Needle 32G X 4 MM MISC Use as directed with insulin  daily 100 each 0   latanoprost  (XALATAN ) 0.005 % ophthalmic solution Place 1 drop into both eyes at bedtime. (Patient taking differently: Place 1 drop into the left eye at bedtime.)     loperamide  (IMODIUM ) 2 MG capsule Take 1 capsule (2 mg total) by mouth as needed for diarrhea or loose stools. (Patient not taking: Reported on 01/29/2024) 30 capsule 0   Methoxy PEG-Epoetin Beta (MIRCERA) 100 MCG/0.3ML SOSY Inject 90 mcg as directed every 14 (fourteen) days.     midodrine  (PROAMATINE ) 5 MG tablet Take 1 tablet (5 mg total) by mouth every Monday, Wednesday, and Friday with hemodialysis. 30 tablet 0   multivitamin  (RENA-VIT) TABS tablet Take 1 tablet by mouth at bedtime. 30 tablet 0   tiZANidine  (ZANAFLEX ) 2 MG tablet Take 1 tablet (2 mg total) by mouth at bedtime. 30 tablet 0   vitamin D3 (CHOLECALCIFEROL ) 25 MCG tablet Take 3 tablets (3,000 Units total) by mouth daily. 90 tablet 0   No current facility-administered medications for this visit.     ROS:  See HPI  Physical Exam:  Today's Vitals   02/23/24 1447  BP: (!) 168/72  Pulse: 63  SpO2: 97%  PainSc: 0-No pain   There is no height or weight on file to calculate BMI.   Incision:  well healed Extremities:   There is a palpable left radial pulse.   Motor and sensory are in tact.   There is a thrill/bruit present.  Access is  easily palpable   Dialysis Duplex on 02/23/2024: +--------------------+----------+-----------------+  AVF                PSV (cm/s)Flow Vol (mL/min)  +--------------------+----------+-----------------+  Native artery inflow   292          1794         +--------------------+----------+-----------------+  AVF Anastomosis        614                       +--------------------+----------+-----------------+     +-------------+----------+-------------+----------+--------+  OUTFLOW VEIN PSV (cm/s)Diameter (cm)Depth (cm)Describe  +-------------+----------+-------------+----------+--------+  Axillary vein   137        1.11        0.76             +-------------+----------+-------------+----------+--------+  Prox UA         127        0.76        0.96             +-------------+----------+-------------+----------+--------+  Mid UA          327        0.52        1.26             +-------------+----------+-------------+----------+--------+  Dist UA         313        0.50        1.32             +-------------+----------+-------------+----------+--------+  AC Fossa        714        0.64        0.82              +-------------+----------+-------------+----------+--------+  Left radial artery wrist: 52 cm/s Antegrade  Left ulnar artery wrist 45 cm/s Antegrade    Summary:  - Patent left upper extremity brachial-basilic arterialvenous fistula with  mildly elevated velocities observed at the anastmosis and AC fossa  outflow.  - Flow volume: 1794 mL/min  - Patent radial and ulnar arteries at the wrist with antegrade flow.    Assessment/Plan:  This is a 73 y.o. male who is s/p: 1st stage BVT on 01/02/2024 by Dr. Charlotte Cookey    -the pt does not have evidence of steal. -his fistula is maturing nicely and can proceed with 2nd stage BVT.  I discussed with pt and family this would require 2-3 incisions to free the vein and superficialize it.  Discussed elevated velocity with Dr. Charlotte Cookey and will evaluate at time of surgery if we need to redo the anastomosis.   We will get this scheduled with Dr. Charlotte Cookey on a non dialysis day.   -if pt has tunneled catheter, this can be removed at the discretion of the dialysis center once the pt's access has been successfully cannulated to their satisfaction.  -discussed with pt that access does not last forever and will need intervention or even new access at some point.  They expressed understanding.    Philip Benjamin, [facility] [phone]  Clinic MD:  Charlotte Cookey

## 2024-02-24 ENCOUNTER — Other Ambulatory Visit: Payer: Self-pay

## 2024-02-24 DIAGNOSIS — N186 End stage renal disease: Secondary | ICD-10-CM

## 2024-03-01 NOTE — Progress Notes (Incomplete)
 03/02/2024 9:24 AM   Philip Benjamin July 27, 1951 [id number]  Referring provider: [name], MD [address] [place],  [place] [address]  No chief complaint on file.   HPI:    PMH: Past Medical History:  Diagnosis Date   BPH (benign prostatic hyperplasia)    Chronic kidney disease    ckd stage 3   Diabetes mellitus without complication (HCC)    Hypertension     Surgical History: Past Surgical History:  Procedure Laterality Date   AV FISTULA PLACEMENT Left 01/02/2024   Procedure: ARTERIOVENOUS (AV) FISTULA CREATION;  Surgeon: Margherita Shell, MD;  Location: [facility] OR;  Service: Vascular;  Laterality: Left;   EYE SURGERY     INGUINAL HERNIA REPAIR Bilateral 01/23/2018   Procedure: BILATERAL OPEN INGUINAL HERNIA REPAIR WITH MESH;  Surgeon: Jerryl Morin, MD;  Location: [facility] OR;  Service: General;  Laterality: Bilateral;   INSERTION OF MESH Bilateral 01/23/2018   Procedure: INSERTION OF MESH;  Surgeon: Jerryl Morin, MD;  Location: [facility] OR;  Service: General;  Laterality: Bilateral;   IR FLUORO GUIDE CV LINE RIGHT  08/15/2023   IR PATIENT EVAL TECH 0-60 MINS  11/18/2023   IR PERC CHOLECYSTOSTOMY  10/06/2023   IR US  GUIDE BX ASP/DRAIN  10/06/2023   IR US  GUIDE VASC ACCESS RIGHT  08/15/2023   VASECTOMY     ~1985    Home Medications:  Allergies as of 03/02/2024   No Known Allergies      Medication List        Accurate as of Mar 01, 2024  9:24 AM. If you have any questions, ask your nurse or doctor.          acetaminophen  650 MG CR tablet Commonly known as: TYLENOL  Take 650 mg by mouth every 6 (six) hours as needed for pain.   ALPRAZolam  0.25 MG tablet Commonly known as: XANAX  Take 1 tablet (0.25 mg total) by mouth 3 (three) times daily as needed for anxiety.   atorvastatin  40 MG tablet Commonly known as: LIPITOR Take 1 tablet (40 mg total) by mouth daily.   cycloSPORINE  0.05 % ophthalmic emulsion Commonly known as: RESTASIS  SMARTSIG:In Eye(s)   Dorzolamide   HCl-Timolol  Mal PF 2-0.5 % Soln Apply 1 drop to eye 2 (two) times daily.   folic acid  800 MCG tablet Commonly known as: FOLVITE  Take 1 tablet (800 mcg total) by mouth daily.   Lantus  SoloStar 100 UNIT/ML Solostar Pen Generic drug: insulin  glargine Inject 9 Units into the skin daily.   latanoprost  0.005 % ophthalmic solution Commonly known as: XALATAN  Place 1 drop into both eyes at bedtime. What changed: how to take this   loperamide  2 MG capsule Commonly known as: IMODIUM  Take 1 capsule (2 mg total) by mouth as needed for diarrhea or loose stools.   midodrine  5 MG tablet Commonly known as: PROAMATINE  Take 1 tablet (5 mg total) by mouth every Monday, Wednesday, and Friday with hemodialysis.   Mircera 100 MCG/0.3ML Sosy Generic drug: Methoxy PEG-Epoetin Beta Inject 90 mcg as directed every 14 (fourteen) days.   multivitamin Tabs tablet Take 1 tablet by mouth at bedtime.   TechLite Plus Pen Needles 32G X 4 MM Misc Generic drug: Insulin  Pen Needle Use as directed with insulin  daily   Insulin  Pen Needle 32G X 4 MM Misc Use as directed with insulin  daily   tiZANidine  2 MG tablet Commonly known as: ZANAFLEX  Take 1 tablet (2 mg total) by mouth at bedtime.   vitamin D3 25  MCG tablet Commonly known as: CHOLECALCIFEROL  Take 3 tablets (3,000 Units total) by mouth daily.        Allergies: No Known Allergies  Family History: No family history on file.  Social History:  reports that he has never smoked. He has never used smokeless tobacco. He reports that he does not drink alcohol and does not use drugs.  ROS: All other review of systems were reviewed and are negative except what is noted above in HPI  Physical Exam: There were no vitals taken for this visit.  Constitutional:  Alert and oriented, No acute distress. HEENT: [place] AT, moist mucus membranes.  Trachea midline, no masses. Cardiovascular: No clubbing, cyanosis, or edema. Respiratory: Normal respiratory effort,  no increased work of breathing. GI: No inguinal hernias GU: Normal phallus. No masses/lesions on penis, testis, scrotum. Prostate ***g smooth no nodules no induration.  Lymph: No cervical or inguinal lymphadenopathy. Skin: No rashes, bruises or suspicious lesions. Neurologic: Grossly intact, no focal deficits, moving all 4 extremities. Psychiatric: Normal mood and affect.  Laboratory Data: Lab Results  Component Value Date   WBC 5.8 01/29/2024   HGB 10.0 (L) 01/29/2024   HCT 31.8 (L) 01/29/2024   MCV 96.1 01/29/2024   PLT 215 01/29/2024    Lab Results  Component Value Date   CREATININE 2.71 (H) 01/29/2024    No results found for: "PSA"  No results found for: "TESTOSTERONE"  Lab Results  Component Value Date   HGBA1C 5.6 12/18/2023    Urinalysis    Component Value Date/Time   COLORURINE YELLOW 01/01/2024 1050   APPEARANCEUR HAZY (A) 01/01/2024 1050   LABSPEC 1.010 01/01/2024 1050   PHURINE 7.0 01/01/2024 1050   GLUCOSEU NEGATIVE 01/01/2024 1050   HGBUR NEGATIVE 01/01/2024 1050   BILIRUBINUR NEGATIVE 01/01/2024 1050   KETONESUR NEGATIVE 01/01/2024 1050   PROTEINUR NEGATIVE 01/01/2024 1050   NITRITE NEGATIVE 01/01/2024 1050   LEUKOCYTESUR LARGE (A) 01/01/2024 1050    Lab Results  Component Value Date   BACTERIA MANY (A) 01/01/2024    Pertinent Imaging: *** No results found for this or any previous visit.  No results found for this or any previous visit.  No results found for this or any previous visit.  No results found for this or any previous visit.  No results found for this or any previous visit.  No results found for this or any previous visit.  No results found for this or any previous visit.  No results found for this or any previous visit.   Assessment & Plan:    There are no diagnoses linked to this encounter.  No follow-ups on file.  Roque Collar, MD  [facility] [place]

## 2024-03-02 ENCOUNTER — Telehealth: Admitting: Urology

## 2024-03-02 ENCOUNTER — Encounter: Payer: Self-pay | Admitting: Urology

## 2024-03-02 ENCOUNTER — Ambulatory Visit: Admitting: Urology

## 2024-03-02 DIAGNOSIS — R338 Other retention of urine: Secondary | ICD-10-CM

## 2024-03-02 DIAGNOSIS — I69351 Hemiplegia and hemiparesis following cerebral infarction affecting right dominant side: Secondary | ICD-10-CM

## 2024-03-02 DIAGNOSIS — N186 End stage renal disease: Secondary | ICD-10-CM

## 2024-03-02 DIAGNOSIS — N401 Enlarged prostate with lower urinary tract symptoms: Secondary | ICD-10-CM

## 2024-03-02 DIAGNOSIS — I63512 Cerebral infarction due to unspecified occlusion or stenosis of left middle cerebral artery: Secondary | ICD-10-CM

## 2024-03-02 DIAGNOSIS — Z87440 Personal history of urinary (tract) infections: Secondary | ICD-10-CM

## 2024-03-02 DIAGNOSIS — M4802 Spinal stenosis, cervical region: Secondary | ICD-10-CM

## 2024-03-02 DIAGNOSIS — Z978 Presence of other specified devices: Secondary | ICD-10-CM

## 2024-03-02 DIAGNOSIS — K5900 Constipation, unspecified: Secondary | ICD-10-CM

## 2024-03-02 DIAGNOSIS — E1122 Type 2 diabetes mellitus with diabetic chronic kidney disease: Secondary | ICD-10-CM

## 2024-03-02 MED ORDER — TAMSULOSIN HCL 0.4 MG PO CAPS: 0.4000 mg | ORAL_CAPSULE | Freq: Every day | ORAL | 2 refills | Status: DC

## 2024-03-02 NOTE — Progress Notes (Signed)
 Name: Philip Benjamin DOB: [birth year] MRN: [id number]  This visit was conducted virtually. All issues noted in this document were discussed and addressed.  A physical exam was not performed with this format.  Philip Benjamin's identity was verified using two patient identifiers.  Patient's location at the time of today's visit: at home in the state of [place] .  Provider's location at the time of today's visit: home in the state of [place] .  Philip Benjamin was advised of the limitations, risks, security, and privacy concerns of performing medical evaluation and management services by video and the alternative option of scheduling an in-person appointment. Patient was advised that there may be charges related to telemedicine visit. The patient expressed understanding and agreed to proceed.  History of Present Illness: Philip Benjamin is a 73 y.o. male who presents today as a new patient at [facility] [place]. All available relevant medical records have been reviewed. He is accompanied by his daughter Philip Benjamin. GU history includes:  1. BPH. 2. ESRD. Followed by Nephrology (Dr. Carrolyn Clan). On hemodialysis MWF. 3. Recurrent UTIs. In late 2024.  He reports chief complaint of urinary retention.  Recent history:  Admitted 12/18/2023 for stroke with right-sided weakness. Was subsequently admitted to inpatient rehabilitation on 12/23/2023. Per Urology consult note: "Despite being ESRD M/W/F dialysis, he still urinates fairly regularly per his report.  He recently had a MDRO Klebsiella UTI, but he appears to receive adequate treatment for this.  When investigating ongoing bladder spasms patient was found to be retaining around 800 cc of urine." Had difficult catheter placement with resultant hematuria; Urology was consulted for bedside cystoscopy and placed an 18 fr council tip catheter on 01/07/2024.  Per discharge summary on 01/15/2024:  - "His Flomax  was discontinued" -  "Ditropan  2.5 mg every 8 hours as needed bladder spasms"  Today: He denies history of urinary retention prior to current episode.  He reports the catheter is draining well with clear yellow / amber urine. Denies gross hematuria, flank pain, abdominal pain, fevers, nausea, or vomiting. Denies any ongoing bladder spasms; has not needed to take the PRN Ditropan . His daughter reports that his catheter has not been exchanged since it was initially placed on 01/07/2024.   He reports constipation. He reports taking Miralax  as needed.   Medications: Current Outpatient Medications  Medication Sig Dispense Refill   tamsulosin  (FLOMAX ) 0.4 MG CAPS capsule Take 1 capsule (0.4 mg total) by mouth daily. 30 capsule 2   acetaminophen  (TYLENOL ) 650 MG CR tablet Take 650 mg by mouth every 6 (six) hours as needed for pain.     ALPRAZolam  (XANAX ) 0.25 MG tablet Take 1 tablet (0.25 mg total) by mouth 3 (three) times daily as needed for anxiety. 10 tablet 0   atorvastatin  (LIPITOR) 40 MG tablet Take 1 tablet (40 mg total) by mouth daily. 30 tablet 0   cycloSPORINE  (RESTASIS ) 0.05 % ophthalmic emulsion SMARTSIG:In Eye(s)     Dorzolamide  HCl-Timolol  Mal PF 2-0.5 % SOLN Apply 1 drop to eye 2 (two) times daily.     folic acid  (FOLVITE ) 800 MCG tablet Take 1 tablet (800 mcg total) by mouth daily. 30 tablet 0   insulin  glargine (LANTUS ) 100 UNIT/ML Solostar Pen Inject 9 Units into the skin daily. 15 mL 11   Insulin  Pen Needle 32G X 4 MM MISC Use as directed with insulin  daily 100 each 0   Insulin  Pen Needle 32G X 4 MM MISC Use as directed with insulin   daily 100 each 0   latanoprost  (XALATAN ) 0.005 % ophthalmic solution Place 1 drop into both eyes at bedtime. (Patient taking differently: Place 1 drop into the left eye at bedtime.)     loperamide  (IMODIUM ) 2 MG capsule Take 1 capsule (2 mg total) by mouth as needed for diarrhea or loose stools. (Patient not taking: Reported on 03/02/2024) 30 capsule 0   Methoxy PEG-Epoetin  Beta (MIRCERA) 100 MCG/0.3ML SOSY Inject 90 mcg as directed every 14 (fourteen) days.     midodrine  (PROAMATINE ) 5 MG tablet Take 1 tablet (5 mg total) by mouth every Monday, Wednesday, and Friday with hemodialysis. 30 tablet 0   multivitamin (RENA-VIT) TABS tablet Take 1 tablet by mouth at bedtime. 30 tablet 0   tiZANidine  (ZANAFLEX ) 2 MG tablet Take 1 tablet (2 mg total) by mouth at bedtime. 30 tablet 0   vitamin D3 (CHOLECALCIFEROL ) 25 MCG tablet Take 3 tablets (3,000 Units total) by mouth daily. 90 tablet 0   No current facility-administered medications for this visit.    Allergies: No Known Allergies  Past Medical History:  Diagnosis Date   BPH (benign prostatic hyperplasia)    Chronic kidney disease    ckd stage 3   Diabetes mellitus without complication (HCC)    Hypertension    Past Surgical History:  Procedure Laterality Date   AV FISTULA PLACEMENT Left 01/02/2024   Procedure: ARTERIOVENOUS (AV) FISTULA CREATION;  Surgeon: Margherita Shell, MD;  Location: [facility] OR;  Service: Vascular;  Laterality: Left;   EYE SURGERY     INGUINAL HERNIA REPAIR Bilateral 01/23/2018   Procedure: BILATERAL OPEN INGUINAL HERNIA REPAIR WITH MESH;  Surgeon: Jerryl Morin, MD;  Location: [facility] OR;  Service: General;  Laterality: Bilateral;   INSERTION OF MESH Bilateral 01/23/2018   Procedure: INSERTION OF MESH;  Surgeon: Jerryl Morin, MD;  Location: [facility] OR;  Service: General;  Laterality: Bilateral;   IR FLUORO GUIDE CV LINE RIGHT  08/15/2023   IR PATIENT EVAL TECH 0-60 MINS  11/18/2023   IR PERC CHOLECYSTOSTOMY  10/06/2023   IR US  GUIDE BX ASP/DRAIN  10/06/2023   IR US  GUIDE VASC ACCESS RIGHT  08/15/2023   VASECTOMY     ~9528   History reviewed. No pertinent family history. Social History   Socioeconomic History   Marital status: Married    Spouse name: Not on file   Number of children: Not on file   Years of education: Not on file   Highest education level: Not on file  Occupational History   Not  on file  Tobacco Use   Smoking status: Never   Smokeless tobacco: Never  Substance and Sexual Activity   Alcohol use: Never   Drug use: Never   Sexual activity: Not on file  Other Topics Concern   Not on file  Social History Narrative   Not on file   Social Drivers of Health   Financial Resource Strain: Not on file  Food Insecurity: Patient Unable To Answer (12/26/2023)   Hunger Vital Sign    Worried About Running Out of Food in the Last Year: Patient unable to answer    Ran Out of Food in the Last Year: Patient unable to answer  Transportation Needs: Patient Unable To Answer (12/26/2023)   PRAPARE - Transportation    Lack of Transportation (Medical): Patient unable to answer    Lack of Transportation (Non-Medical): Patient unable to answer  Physical Activity: Not on file  Stress: Not on file  Social Connections:  Patient Unable To Answer (12/26/2023)   Social Connection and Isolation Panel [NHANES]    Frequency of Communication with Friends and Family: Patient unable to answer    Frequency of Social Gatherings with Friends and Family: Patient unable to answer    Attends Religious Services: Patient unable to answer    Active Member of Clubs or Organizations: Patient unable to answer    Attends Banker Meetings: Patient unable to answer    Marital Status: Patient unable to answer  Intimate Partner Violence: Not At Risk (12/26/2023)   Humiliation, Afraid, Rape, and Kick questionnaire    Fear of Current or Ex-Partner: No    Emotionally Abused: No    Physically Abused: No    Sexually Abused: No    SUBJECTIVE  Review of Systems Constitutional: Patient denies any unintentional weight loss or change in strength Cardiovascular: Patient denies chest pain or syncope Respiratory: Patient denies shortness of breath Gastrointestinal: As per HPI  Musculoskeletal: Patient reports weakness; working with in-home PT Hematologic/Lymphatic: Patient denies bleeding tendencies  GU:  As per HPI.  OBJECTIVE There were no vitals filed for this visit. There is no height or weight on file to calculate BMI.  Physical Examination - Limited due to telemedicine encounter format Constitutional: No obvious distress; patient is non-toxic appearing  Respiratory: The patient does not have audible wheezing/stridor; respirations do not appear labored  Neurologic: CN 2-12 grossly intact Psychiatric: Answered questions appropriately with normal affect    ASSESSMENT Benign prostatic hyperplasia with urinary retention - Plan: tamsulosin  (FLOMAX ) 0.4 MG CAPS capsule  Left middle cerebral artery stroke (HCC)  ESRD (end stage renal disease) on dialysis (HCC)  Type 2 diabetes mellitus with chronic kidney disease on chronic dialysis, with long-term current use of insulin  (HCC)  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   Advised restarting Flomax  (Tamsulosin ) 0.4 mg daily to address his BPH; side effects were discussed. Advised him to continue constipation prevention / management and to to continue holding Ditropan  PRN since those can exacerbate the risk for urinary retention.    His indwelling Foley catheter is overdue for exchange. We discussed how should be exchanged every 4 weeks by a nurse (either in office or via home health) to prevent catheter from becoming clogged with sediment. 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  He was advised that his urine will always appear infected on UA due to indwelling catheter and that we would not advise antibiotic treatment unless systemic symptoms are present (such as new onset or worsening of fever, rigors, altered mental status, malaise, or lethargy with no other identified cause; flank pain; CVA tenderness).  He was advised to go to the ER if his catheter stops draining, if he develops fever >100.5 F, or other significantly concerning symptoms.  Patient and daughter verbalized understanding of and agreement with current plan. All  questions were answered.  PLAN Advised the following: Start Flomax  (Tamsulosin ) 0.4 mg daily.  Constipation management / prevention. Hold Ditropan  PRN. Return in about 2 days (around 03/04/2024) for nurse visit for voiding trial on a Tues/Thurs; MD must be present (prior complex cath placement).  No orders of the defined types were placed in this encounter.  Total time spent caring for the patient today was over 45 minutes. This includes time spent on the date of the visit reviewing the patient's chart before the visit, time spent during the visit, and time spent after the visit on documentation. Over 50% of that time was spent in face-to-face time with this patient for direct counseling. E&M based on time and complexity of medical decision making.  It has been explained that the patient is to follow regularly with their PCP in addition to all other providers involved in their care and to follow instructions provided by these respective offices. Patient advised to contact urology clinic if any urologic-pertaining questions, concerns, new symptoms or problems arise in the interim period.  There are no Patient Instructions on file for this visit.  Electronically  signed by:  Lauretta Ponto, MSN, FNP-C, CUNP 03/02/2024 10:06 AM

## 2024-03-09 ENCOUNTER — Ambulatory Visit (INDEPENDENT_AMBULATORY_CARE_PROVIDER_SITE_OTHER)

## 2024-03-09 ENCOUNTER — Telehealth: Payer: Self-pay

## 2024-03-09 DIAGNOSIS — R339 Retention of urine, unspecified: Secondary | ICD-10-CM | POA: Diagnosis not present

## 2024-03-09 DIAGNOSIS — Z978 Presence of other specified devices: Secondary | ICD-10-CM

## 2024-03-09 DIAGNOSIS — N401 Enlarged prostate with lower urinary tract symptoms: Secondary | ICD-10-CM

## 2024-03-09 MED ORDER — TAMSULOSIN HCL 0.4 MG PO CAPS: 0.4000 mg | ORAL_CAPSULE | Freq: Two times a day (BID) | ORAL | 0 refills | Status: DC

## 2024-03-09 MED ORDER — CEPHALEXIN 250 MG PO CAPS
500.0000 mg | ORAL_CAPSULE | Freq: Once | ORAL | Status: AC
Start: 2024-03-09 — End: 2024-03-09
  Administered 2024-03-09: 500 mg via ORAL

## 2024-03-09 NOTE — Addendum Note (Signed)
 Addended by: Roselee Cong on: 03/09/2024 02:01 PM   Modules accepted: Level of Service

## 2024-03-09 NOTE — Telephone Encounter (Signed)
 Per NP daughter made aware to increase Flomax  to BID. Patient scheduled for repeat voiding trial.

## 2024-03-09 NOTE — Progress Notes (Addendum)
 Patient in office today for voiding trial due to urinary retention  Fill and Pull Catheter Removal  Patient is present today for a catheter removal.  of sterile water was instilled into the bladder when the patient felt the urge to urinate. 10ml of water was then drained from the balloon.  A 16FR foley cath was removed from the bladder no complications were noted .  Foley catheter intact and time of removal. Patient as then given some time to void on their own.  Patient can void  75ml on their own after some time.  Patient tolerated well.  One oral prophylactic antibiotic given per MD orders  Performed by: Arnett Galindez LPN  Follow up/ Additional notes: keep scheduled f/u   Simple Catheter Placement  Due to urinary retention patient is present today for a foley cath placement.  Patient was cleaned and prepped in a sterile fashion with Betadinex3 . A 16 FR foley catheter coude was inserted, urine return was noted  , urine was Clear yellow in color.  The balloon was filled with 10cc of sterile water.  A bedside bag was attached for drainage. Patient was also given a night bag to take home and was given instruction on how to change from one bag to another.  Patient was given instruction on proper catheter care.  Patient tolerated well, no complications were noted   Performed by: Saamiya Jeppsen LPN  Additional notes/ Follow up:  keep scheduled f/u with provider

## 2024-03-09 NOTE — Patient Instructions (Signed)

## 2024-03-18 ENCOUNTER — Ambulatory Visit: Admitting: Urology

## 2024-03-30 ENCOUNTER — Encounter: Payer: Self-pay | Admitting: Urology

## 2024-03-30 ENCOUNTER — Ambulatory Visit (INDEPENDENT_AMBULATORY_CARE_PROVIDER_SITE_OTHER): Admitting: Urology

## 2024-03-30 VITALS — BP 121/70 | HR 82

## 2024-03-30 DIAGNOSIS — Z978 Presence of other specified devices: Secondary | ICD-10-CM

## 2024-03-30 DIAGNOSIS — R338 Other retention of urine: Secondary | ICD-10-CM | POA: Diagnosis not present

## 2024-03-30 DIAGNOSIS — N401 Enlarged prostate with lower urinary tract symptoms: Secondary | ICD-10-CM

## 2024-03-30 LAB — BLADDER VOIDING TRIAL: Scan Result: 0

## 2024-03-30 MED ORDER — SULFAMETHOXAZOLE-TRIMETHOPRIM 800-160 MG PO TABS: ORAL_TABLET | ORAL | 0 refills | Status: DC

## 2024-03-30 MED ORDER — TAMSULOSIN HCL 0.4 MG PO CAPS
0.4000 mg | ORAL_CAPSULE | Freq: Two times a day (BID) | ORAL | 0 refills | Status: AC
Start: 2024-03-30 — End: ?

## 2024-03-30 NOTE — Progress Notes (Signed)
 This man is here today for voiding trial and follow-up.  His catheter was removed earlier this morning.  He has had the catheter basically since his stroke in late March, 2025.  He has had bladder spasms with the catheter in place.  Since he has been home, he has voided 2-3 times and has a residual urine volume of 0 in the office here.  He is on dialysis, but despite that makes about 600 cc of urine a day.  He is on tamsulosin  twice a day.  He has not had any dizzy episodes.  I reviewed the patient's prior urology notes as well as laboratories and his bladder scan today.  Impression: BPH with retention, now voiding adequately  Plan: He will continue tamsulosin  twice a day  I will have him come back in 2 to 3 weeks to check residual urine as well as his voiding and urinalysis  Eventually we will try to wean him down to 1 tamsulosin  a day  I sent in 3 days of Bactrim  DS 1 p.o. daily to cover his catheter placement

## 2024-03-30 NOTE — Progress Notes (Signed)
 Fill and Pull Catheter Removal  Patient is present today for a catheter removal.  400 ml of sterile water was instilled into the bladder when the patient felt the urge to urinate. 8 ml of water was then drained from the balloon.  A 16 FR  coude foley cath was removed from the bladder no complications were noted . Foley catheter intact and time of removal. Patient as then given some time to void on their own.  Patient cannot void  0 ml on their own after some time.  Patient tolerated well.  One oral prophylactic antibiotic given per MD orders  Performed by: Gorden Latino, CMA  Follow up/ Additional notes: Pt return at 2 pm for PVR check and office visited with Dr. Joie Narrow

## 2024-04-06 ENCOUNTER — Other Ambulatory Visit: Payer: Self-pay | Admitting: Urology

## 2024-04-06 ENCOUNTER — Ambulatory Visit: Admitting: Urology

## 2024-04-06 DIAGNOSIS — N401 Enlarged prostate with lower urinary tract symptoms: Secondary | ICD-10-CM

## 2024-04-06 DIAGNOSIS — Z978 Presence of other specified devices: Secondary | ICD-10-CM

## 2024-04-09 ENCOUNTER — Ambulatory Visit

## 2024-04-14 ENCOUNTER — Other Ambulatory Visit: Payer: Self-pay

## 2024-04-14 ENCOUNTER — Ambulatory Visit: Admitting: Urology

## 2024-04-14 ENCOUNTER — Encounter (HOSPITAL_COMMUNITY): Payer: Self-pay | Admitting: Vascular Surgery

## 2024-04-14 NOTE — Pre-Procedure Instructions (Signed)
 -------------  SDW INSTRUCTIONS given:  Your procedure is scheduled on 6/26.  Report to [facility] Main Entrance A at 0530 A.M., and check in at the Admitting office.  Any questions or running late day of surgery: call [phone]    Remember:  Do not eat or drink after midnight the night before your surgery     Take these medicines the morning of surgery with A SIP OF WATER  ASA, atorvastatin  (LIPITOR), cycloSPORINE  (RESTASIS ),  Dorzolamide  HCl-Timolol  Mal, FLUoxetine  (PROZAC ),   ondansetron  (ZOFRAN ), tamsulosin  (FLOMAX )                                                                                      May take these medicines IF NEEDED:  tylenol , xanax    As of today, STOP taking any Aleve, Naproxen, Ibuprofen, Motrin, Advil, Goody's, BC's, all herbal medications, fish oil, and all vitamins.  WHAT DO I DO ABOUT MY DIABETES MEDICATION?      THE MORNING OF SURGERY, take 10 units (50%) of insulin  glargine (LANTUS )     HOW TO MANAGE YOUR DIABETES BEFORE AND AFTER SURGERY  Why is it important to control my blood sugar before and after surgery? Improving blood sugar levels before and after surgery helps healing and can limit problems. A way of improving blood sugar control is eating a healthy diet by:  Eating less sugar and carbohydrates  Increasing activity/exercise  Talking with your doctor about reaching your blood sugar goals High blood sugars (greater than 180 mg/dL) can raise your risk of infections and slow your recovery, so you will need to focus on controlling your diabetes during the weeks before surgery. Make sure that the doctor who takes care of your diabetes knows about your planned surgery including the date and location.  How do I manage my blood sugar before surgery? Check your blood sugar at least 4 times a day, starting 2 days before surgery, to make sure that the level is not too high or low.  Check your blood sugar the morning of your surgery when you  wake up and every 2 hours until you get to the Short Stay unit.  If your blood sugar is less than 70 mg/dL, you will need to treat for low blood sugar: Do not take insulin . Treat a low blood sugar (less than 70 mg/dL) with  cup of clear juice (cranberry or apple), 4 glucose tablets, OR glucose gel. Recheck blood sugar in 15 minutes after treatment (to make sure it is greater than 70 mg/dL). If your blood sugar is not greater than 70 mg/dL on recheck, call [phone] for further instructions. Report your blood sugar to the short stay nurse when you get to Short Stay.  If you are admitted to the hospital after surgery: Your blood sugar will be checked by the staff and you will probably be given insulin  after surgery (instead of oral diabetes medicines) to make sure you have good blood sugar levels. The goal for blood sugar control after surgery is 80-180 mg/dL.   Do NOT Smoke (Tobacco/Vaping) 24 hours prior to your procedure  If you use a CPAP at night, you may bring all equipment  for your overnight stay.     You will be asked to remove any contacts, glasses, piercing's, hearing aid's, dentures/partials prior to surgery. Please bring cases for these items if needed.     Patients discharged the day of surgery will not be allowed to drive home, and someone needs to stay with them for 24 hours.  SURGICAL WAITING ROOM VISITATION Patients may have no more than 2 support people in the waiting area - these visitors may rotate.   Pre-op  nurse will coordinate an appropriate time for 1 ADULT support person, who may not rotate, to accompany patient in pre-op .  Children under the age of 40 must have an adult with them who is not the patient and must remain in the main waiting area with an adult.  If the patient needs to stay at the hospital during part of their recovery, the visitor guidelines for inpatient rooms apply.  Please refer to the [facility] website for the visitor guidelines for any  additional information.   Special instructions:   [place]- Preparing For Surgery   Please follow these instructions carefully.   Shower the NIGHT BEFORE SURGERY and the MORNING OF SURGERY with DIAL Soap.   Pat yourself dry with a CLEAN TOWEL.  Wear CLEAN PAJAMAS to bed the night before surgery  Place CLEAN SHEETS on your bed the night of your first shower and DO NOT SLEEP WITH PETS.   Additional instructions for the day of surgery: DO NOT APPLY any lotions, deodorants, cologne, or perfumes.   Do not wear jewelry or makeup Do not wear nail polish, gel polish, artificial nails, or any other type of covering on natural nails (fingers and toes) Do not bring valuables to the hospital. [facility] is not responsible for valuables/personal belongings. Put on clean/comfortable clothes.  Please brush your teeth.  Ask your nurse before applying any prescription medications to the skin.

## 2024-04-14 NOTE — Progress Notes (Signed)
 PCP - Dr. Eligio Fairly Cardiologist - denies  PPM/ICD - denies   Chest x-ray - 12/18/23 EKG - 12/19/23 Stress Test - denies ECHO - 12/19/23 Cardiac Cath - denies  CPAP - denies  Fasting Blood Sugar - 80-110 Checks Blood Sugar once/day  Blood Thinner Instructions: n/a Aspirin  Instructions: continue  ERAS Protcol - NPO  COVID TEST- n/a  Anesthesia review: no  Patient verbally denies any shortness of breath, fever, cough and chest pain during phone call      Questions were answered. Patient verbalized understanding of instructions.

## 2024-04-15 ENCOUNTER — Ambulatory Visit (HOSPITAL_COMMUNITY): Admitting: Anesthesiology

## 2024-04-15 ENCOUNTER — Encounter (HOSPITAL_COMMUNITY): Payer: Self-pay | Admitting: Vascular Surgery

## 2024-04-15 ENCOUNTER — Encounter (HOSPITAL_COMMUNITY)
Admission: RE | Disposition: A | Discharge status: Home / Self Care | Payer: Self-pay | Source: Home / Self Care | Attending: Vascular Surgery

## 2024-04-15 ENCOUNTER — Ambulatory Visit (HOSPITAL_COMMUNITY)
Admission: RE | Admit: 2024-04-15 | Discharge: 2024-04-15 | Disposition: A | Discharge status: Home / Self Care | Attending: Vascular Surgery | Admitting: Vascular Surgery

## 2024-04-15 ENCOUNTER — Other Ambulatory Visit: Payer: Self-pay

## 2024-04-15 DIAGNOSIS — N186 End stage renal disease: Secondary | ICD-10-CM | POA: Diagnosis not present

## 2024-04-15 DIAGNOSIS — I120 Hypertensive chronic kidney disease with stage 5 chronic kidney disease or end stage renal disease: Secondary | ICD-10-CM | POA: Insufficient documentation

## 2024-04-15 DIAGNOSIS — F418 Other specified anxiety disorders: Secondary | ICD-10-CM

## 2024-04-15 DIAGNOSIS — E1122 Type 2 diabetes mellitus with diabetic chronic kidney disease: Secondary | ICD-10-CM | POA: Insufficient documentation

## 2024-04-15 DIAGNOSIS — Z992 Dependence on renal dialysis: Secondary | ICD-10-CM

## 2024-04-15 DIAGNOSIS — Z794 Long term (current) use of insulin: Secondary | ICD-10-CM | POA: Diagnosis not present

## 2024-04-15 HISTORY — PX: BASCILIC VEIN TRANSPOSITION: SHX5742

## 2024-04-15 HISTORY — DX: Unspecified glaucoma: H40.9

## 2024-04-15 LAB — POCT I-STAT, CHEM 8
BUN: 20 mg/dL (ref 8–23)
Calcium, Ion: 1.08 mmol/L — ABNORMAL LOW (ref 1.15–1.40)
Chloride: 96 mmol/L — ABNORMAL LOW (ref 98–111)
Creatinine, Ser: 2.6 mg/dL — ABNORMAL HIGH (ref 0.61–1.24)
Glucose, Bld: 86 mg/dL (ref 70–99)
HCT: 26 % — ABNORMAL LOW (ref 39.0–52.0)
Hemoglobin: 8.8 g/dL — ABNORMAL LOW (ref 13.0–17.0)
Potassium: 3.6 mmol/L (ref 3.5–5.1)
Sodium: 135 mmol/L (ref 135–145)
TCO2: 27 mmol/L (ref 22–32)

## 2024-04-15 LAB — GLUCOSE, CAPILLARY
Glucose-Capillary: 70 mg/dL (ref 70–99)
Glucose-Capillary: 94 mg/dL (ref 70–99)
Glucose-Capillary: 101 mg/dL — ABNORMAL HIGH (ref 70–99)

## 2024-04-15 SURGERY — TRANSPOSITION, VEIN, BASILIC
Anesthesia: General | Site: Arm Upper | Laterality: Left

## 2024-04-15 MED ORDER — OXYCODONE-ACETAMINOPHEN 5-325 MG PO TABS: 1.0000 | ORAL_TABLET | Freq: Four times a day (QID) | ORAL | 0 refills | Status: AC | PRN

## 2024-04-15 MED ORDER — OXYCODONE HCL 5 MG PO TABS: 5.0000 mg | ORAL_TABLET | Freq: Once | ORAL | Status: DC | PRN

## 2024-04-15 MED ORDER — 0.9 % SODIUM CHLORIDE (POUR BTL) OPTIME
TOPICAL | Status: DC | PRN
Start: 2024-04-15 — End: 2024-04-15
  Administered 2024-04-15: 1000 mL

## 2024-04-15 MED ORDER — CHLORHEXIDINE GLUCONATE 0.12 % MT SOLN
15.0000 mL | Freq: Once | OROMUCOSAL | Status: AC
  Administered 2024-04-15: 15 mL via OROMUCOSAL
  Filled 2024-04-15: qty 15

## 2024-04-15 MED ORDER — PHENYLEPHRINE 80 MCG/ML (10ML) SYRINGE FOR IV PUSH (FOR BLOOD PRESSURE SUPPORT)
PREFILLED_SYRINGE | INTRAVENOUS | Status: DC | PRN
  Administered 2024-04-15: 80 ug via INTRAVENOUS
  Administered 2024-04-15: 160 ug via INTRAVENOUS

## 2024-04-15 MED ORDER — HEPARIN SODIUM (PORCINE) 1000 UNIT/ML IJ SOLN
INTRAMUSCULAR | Status: DC | PRN
  Administered 2024-04-15: 3000 [IU] via INTRAVENOUS

## 2024-04-15 MED ORDER — FENTANYL CITRATE (PF) 250 MCG/5ML IJ SOLN
INTRAMUSCULAR | Status: AC
  Filled 2024-04-15: qty 5

## 2024-04-15 MED ORDER — HEPARIN 6000 UNIT IRRIGATION SOLUTION
Status: DC | PRN
  Administered 2024-04-15: 1

## 2024-04-15 MED ORDER — MIDAZOLAM HCL 2 MG/2ML IJ SOLN
INTRAMUSCULAR | Status: AC
  Filled 2024-04-15: qty 2

## 2024-04-15 MED ORDER — VASOPRESSIN 20 UNIT/ML IV SOLN
INTRAVENOUS | Status: DC | PRN
  Administered 2024-04-15 (×4): 1 [IU] via INTRAVENOUS
  Administered 2024-04-15: 2 [IU] via INTRAVENOUS
  Administered 2024-04-15: 1 [IU] via INTRAVENOUS

## 2024-04-15 MED ORDER — LIDOCAINE 2% (20 MG/ML) 5 ML SYRINGE
INTRAMUSCULAR | Status: AC
  Filled 2024-04-15: qty 5

## 2024-04-15 MED ORDER — DEXAMETHASONE SODIUM PHOSPHATE 10 MG/ML IJ SOLN
INTRAMUSCULAR | Status: AC
  Filled 2024-04-15: qty 1

## 2024-04-15 MED ORDER — FENTANYL CITRATE (PF) 250 MCG/5ML IJ SOLN
INTRAMUSCULAR | Status: DC | PRN
  Administered 2024-04-15: 25 ug via INTRAVENOUS

## 2024-04-15 MED ORDER — HEPARIN 6000 UNIT IRRIGATION SOLUTION
Status: AC
  Filled 2024-04-15: qty 500

## 2024-04-15 MED ORDER — CHLORHEXIDINE GLUCONATE 4 % EX SOLN: 60.0000 mL | Freq: Once | CUTANEOUS | Status: DC

## 2024-04-15 MED ORDER — FENTANYL CITRATE (PF) 100 MCG/2ML IJ SOLN: 25.0000 ug | INTRAMUSCULAR | Status: DC | PRN

## 2024-04-15 MED ORDER — CEFAZOLIN SODIUM-DEXTROSE 2-4 GM/100ML-% IV SOLN
2.0000 g | INTRAVENOUS | Status: AC
  Administered 2024-04-15: 2 g via INTRAVENOUS
  Filled 2024-04-15: qty 100

## 2024-04-15 MED ORDER — ONDANSETRON HCL 4 MG/2ML IJ SOLN
INTRAMUSCULAR | Status: AC
  Filled 2024-04-15: qty 2

## 2024-04-15 MED ORDER — ORAL CARE MOUTH RINSE: 15.0000 mL | Freq: Once | OROMUCOSAL | Status: AC

## 2024-04-15 MED ORDER — ALBUMIN HUMAN 5 % IV SOLN: INTRAVENOUS | Status: DC | PRN

## 2024-04-15 MED ORDER — SODIUM CHLORIDE 0.9% FLUSH: 3.0000 mL | INTRAVENOUS | Status: DC | PRN

## 2024-04-15 MED ORDER — LIDOCAINE-EPINEPHRINE (PF) 1 %-1:200000 IJ SOLN
INTRAMUSCULAR | Status: AC
  Filled 2024-04-15: qty 30

## 2024-04-15 MED ORDER — PAPAVERINE HCL 30 MG/ML IJ SOLN
INTRAMUSCULAR | Status: AC
  Filled 2024-04-15: qty 2

## 2024-04-15 MED ORDER — DEXAMETHASONE SODIUM PHOSPHATE 10 MG/ML IJ SOLN
INTRAMUSCULAR | Status: DC | PRN
  Administered 2024-04-15: 10 mg via INTRAVENOUS

## 2024-04-15 MED ORDER — PROPOFOL 10 MG/ML IV BOLUS
INTRAVENOUS | Status: DC | PRN
  Administered 2024-04-15: 20 mg via INTRAVENOUS
  Administered 2024-04-15: 90 mg via INTRAVENOUS

## 2024-04-15 MED ORDER — ACETAMINOPHEN 10 MG/ML IV SOLN: 1000.0000 mg | Freq: Once | INTRAVENOUS | Status: DC | PRN

## 2024-04-15 MED ORDER — OXYCODONE HCL 5 MG/5ML PO SOLN: 5.0000 mg | Freq: Once | ORAL | Status: DC | PRN

## 2024-04-15 MED ORDER — LIDOCAINE 2% (20 MG/ML) 5 ML SYRINGE
INTRAMUSCULAR | Status: DC | PRN
  Administered 2024-04-15: 60 mg via INTRAVENOUS

## 2024-04-15 MED ORDER — ONDANSETRON HCL 4 MG/2ML IJ SOLN
INTRAMUSCULAR | Status: DC | PRN
  Administered 2024-04-15: 4 mg via INTRAVENOUS

## 2024-04-15 MED ORDER — SODIUM CHLORIDE 0.9 % IV SOLN: INTRAVENOUS | Status: DC

## 2024-04-15 MED ORDER — GLYCOPYRROLATE 0.2 MG/ML IJ SOLN
INTRAMUSCULAR | Status: DC | PRN
  Administered 2024-04-15: .2 mg via INTRAVENOUS

## 2024-04-15 MED ORDER — PROPOFOL 10 MG/ML IV BOLUS
INTRAVENOUS | Status: AC
  Filled 2024-04-15: qty 20

## 2024-04-15 MED ORDER — LIDOCAINE-EPINEPHRINE (PF) 1 %-1:200000 IJ SOLN
INTRAMUSCULAR | Status: DC | PRN
  Administered 2024-04-15: 10 mL
  Administered 2024-04-15: 6 mL

## 2024-04-15 SURGICAL SUPPLY — 35 items
ARMBAND PINK RESTRICT EXTREMIT (MISCELLANEOUS) ×1 IMPLANT
BAG COUNTER SPONGE SURGICOUNT (BAG) ×1 IMPLANT
BNDG ELASTIC 4INX 5YD STR LF (GAUZE/BANDAGES/DRESSINGS) IMPLANT
BNDG ELASTIC 6INX 5YD STR LF (GAUZE/BANDAGES/DRESSINGS) IMPLANT
CANISTER SUCTION 3000ML PPV (SUCTIONS) ×1 IMPLANT
CLIP TI MEDIUM 24 (CLIP) ×1 IMPLANT
CLIP TI MEDIUM 6 (CLIP) ×1 IMPLANT
CLIP TI WIDE RED SMALL 24 (CLIP) ×1 IMPLANT
CLIP TI WIDE RED SMALL 6 (CLIP) ×1 IMPLANT
COVER PROBE W GEL 5X96 (DRAPES) ×1 IMPLANT
DERMABOND ADVANCED .7 DNX12 (GAUZE/BANDAGES/DRESSINGS) ×1 IMPLANT
ELECTRODE REM PT RTRN 9FT ADLT (ELECTROSURGICAL) ×1 IMPLANT
GLOVE BIOGEL PI IND STRL 7.0 (GLOVE) ×1 IMPLANT
GLOVE BIOGEL PI IND STRL 9 (GLOVE) IMPLANT
GLOVE INDICATOR 7.0 STRL GRN (GLOVE) IMPLANT
GOWN STRL REUS W/ TWL LRG LVL3 (GOWN DISPOSABLE) ×2 IMPLANT
GOWN STRL REUS W/ TWL XL LVL3 (GOWN DISPOSABLE) ×1 IMPLANT
KIT BASIN OR (CUSTOM PROCEDURE TRAY) ×1 IMPLANT
KIT TURNOVER KIT B (KITS) ×1 IMPLANT
LOOP VESSEL MINI RED (MISCELLANEOUS) IMPLANT
NS IRRIG 1000ML POUR BTL (IV SOLUTION) ×1 IMPLANT
PACK CV ACCESS (CUSTOM PROCEDURE TRAY) ×1 IMPLANT
PAD ARMBOARD POSITIONER FOAM (MISCELLANEOUS) ×2 IMPLANT
POWDER SURGICEL 3.0 GRAM (HEMOSTASIS) IMPLANT
SLING ARM FOAM STRAP LRG (SOFTGOODS) IMPLANT
SUT MNCRL AB 4-0 PS2 18 (SUTURE) ×1 IMPLANT
SUT PROLENE 6 0 BV (SUTURE) ×1 IMPLANT
SUT SILK 2 0 PERMA HAND 18 BK (SUTURE) IMPLANT
SUT SILK 2 0 SH (SUTURE) IMPLANT
SUT SILK 3-0 18XBRD TIE 12 (SUTURE) IMPLANT
SUT VIC AB 3-0 SH 27X BRD (SUTURE) ×1 IMPLANT
SYR CONTROL 10ML LL (SYRINGE) IMPLANT
TOWEL GREEN STERILE (TOWEL DISPOSABLE) ×1 IMPLANT
UNDERPAD 30X36 HEAVY ABSORB (UNDERPADS AND DIAPERS) ×1 IMPLANT
WATER STERILE IRR 1000ML POUR (IV SOLUTION) ×1 IMPLANT

## 2024-04-15 NOTE — Anesthesia Procedure Notes (Signed)
 Procedure Name: LMA Insertion Date/Time: 04/15/2024 7:58 AM  Performed by: Julien Manus, CRNAPre-anesthesia Checklist: Patient identified, Emergency Drugs available, Suction available and Patient being monitored Patient Re-evaluated:Patient Re-evaluated prior to induction Oxygen Delivery Method: Circle System Utilized Preoxygenation: Pre-oxygenation with 100% oxygen Induction Type: IV induction Ventilation: Mask ventilation without difficulty LMA: LMA inserted LMA Size: 5.0 Number of attempts: 1 Airway Equipment and Method: Bite block Placement Confirmation: positive ETCO2 Tube secured with: Tape Dental Injury: Teeth and Oropharynx as per pre-operative assessment

## 2024-04-15 NOTE — Discharge Instructions (Signed)
[facility]  Discharge Instructions  AV Fistula or Graft Surgery for Dialysis Access  Please refer to the following instructions for your post-procedure care. Your surgeon or physician assistant will discuss any changes with you.  Activity  You may drive the day following your surgery, if you are comfortable and no longer taking prescription pain medication. Resume full activity as the soreness in your incision resolves.  Bathing/Showering  You may shower after you go home. Keep your incision dry for 48 hours. Do not soak in a bathtub, hot tub, or swim until the incision heals completely. You may not shower if you have a hemodialysis catheter.  Incision Care  Clean your incision with mild soap and water after 48 hours. Pat the area dry with a clean towel. You do not need a bandage unless otherwise instructed. Do not apply any ointments or creams to your incision. You may have skin glue on your incision. Do not peel it off. It will come off on its own in about one week. Your arm may swell a bit after surgery. To reduce swelling use pillows to elevate your arm so it is above your heart. Your doctor will tell you if you need to lightly wrap your arm with an ACE bandage.  Diet  Resume your normal diet. There are not special food restrictions following this procedure. In order to heal from your surgery, it is CRITICAL to get adequate nutrition. Your body requires vitamins, minerals, and protein. Vegetables are the best source of vitamins and minerals. Vegetables also provide the perfect balance of protein. Processed food has little nutritional value, so try to avoid this.  Medications  Resume taking all of your medications. If your incision is causing pain, you may take over-the counter pain relievers such as acetaminophen  (Tylenol ). If you were prescribed a stronger pain medication, please be aware these medications can cause nausea and constipation. Prevent  nausea by taking the medication with a snack or meal. Avoid constipation by drinking plenty of fluids and eating foods with high amount of fiber, such as fruits, vegetables, and grains.  Do not take Tylenol  if you are taking prescription pain medications.  Follow up Your surgeon may want to see you in the office following your access surgery. If so, this will be arranged at the time of your surgery.  Please call us  immediately for any of the following conditions:  Increased pain, redness, drainage (pus) from your incision site Fever of 101 degrees or higher Severe or worsening pain at your incision site Hand pain or numbness.  Reduce your risk of vascular disease:  Stop smoking. If you would like help, call [facility] at 1-800-QUIT-NOW (204 394 2578) or [facility] at 831-630-5605  Manage your cholesterol Maintain a desired weight Control your diabetes Keep your blood pressure down  Dialysis  It will take several weeks to several months for your new dialysis access to be ready for use. Your surgeon will determine when it is okay to use it. Your nephrologist will continue to direct your dialysis. You can continue to use your Permcath until your new access is ready for use.   04/15/2024 Philip Benjamin [id number] 11-05-1950  Surgeon(s): Pearline Norman RAMAN, MD  Procedure(s): SECOND STAGE TRANSPOSITION, VEIN, BASILIC   May stick graft immediately   May stick graft on designated area only:   X Do not stick left AV fistula for 6 weeks    If you have any questions, please call the office at  [phone]. 

## 2024-04-15 NOTE — Progress Notes (Signed)
 Pt's CBG 70, Istat glucose- 86. No signs/symptoms. Per dr. Leopoldo will recheck in OR.

## 2024-04-15 NOTE — Anesthesia Preprocedure Evaluation (Signed)
 Anesthesia Evaluation  Patient identified by MRN, date of birth, ID band Patient awake    Reviewed: Allergy & Precautions, NPO status , Patient's Chart, lab work & pertinent test results  History of Anesthesia Complications Negative for: history of anesthetic complications  Airway Mallampati: III  TM Distance: >3 FB Neck ROM: Full  Mouth opening: Limited Mouth Opening  Dental  (+) Teeth Intact, Dental Advisory Given   Pulmonary neg pulmonary ROS   breath sounds clear to auscultation       Cardiovascular hypertension, (-) angina  Rhythm:Irregular   1. Left ventricular ejection fraction, by estimation, is 60 to 65%. The  left ventricle has normal function. The left ventricle has no regional  wall motion abnormalities. There is mild left ventricular hypertrophy.  Left ventricular diastolic parameters  were normal.   2. Right ventricular systolic function is normal. The right ventricular  size is normal. Tricuspid regurgitation signal is inadequate for assessing  PA pressure.   3. Left atrial size was moderately dilated.   4. The mitral valve is normal in structure. Trivial mitral valve  regurgitation. No evidence of mitral stenosis.   5. The aortic valve is tricuspid. Aortic valve regurgitation is not  visualized. No aortic stenosis is present.   6. The inferior vena cava is normal in size with greater than 50%  respiratory variability, suggesting right atrial pressure of 3 mmHg.     Neuro/Psych Glaucoma CVA  negative psych ROS   GI/Hepatic negative GI ROS, Neg liver ROS,,,  Endo/Other  diabetes, Insulin  Dependent    Renal/GU ESRF and DialysisRenal diseaseLab Results      Component                Value               Date                      NA                       135                 04/15/2024                K                        3.6                 04/15/2024                CO2                      26                   01/29/2024                GLUCOSE                  86                  04/15/2024                BUN                      20                  04/15/2024  CREATININE               2.60 (H)            04/15/2024                CALCIUM                   9.0                 01/29/2024                GFRNONAA                 24 (L)              01/29/2024            HD yesterday     Musculoskeletal negative musculoskeletal ROS (+)    Abdominal   Peds  Hematology  (+) Blood dyscrasia, anemia Lab Results      Component                Value               Date                      WBC                      5.8                 01/29/2024                HGB                      8.8 (L)             04/15/2024                HCT                      26.0 (L)            04/15/2024                MCV                      96.1                01/29/2024                PLT                      215                 01/29/2024              Anesthesia Other Findings End stage renal disease   Reproductive/Obstetrics                              Anesthesia Physical Anesthesia Plan  ASA: 3  Anesthesia Plan: General   Post-op Pain Management:    Induction: Intravenous  PONV Risk Score and Plan: 3 and Ondansetron , Dexamethasone  and Treatment may vary due to age or medical condition  Airway Management Planned: LMA  Additional Equipment: None  Intra-op Plan:   Post-operative Plan: Extubation in OR  Informed Consent: I have reviewed the patients History and Physical, chart,  labs and discussed the procedure including the risks, benefits and alternatives for the proposed anesthesia with the patient or authorized representative who has indicated his/her understanding and acceptance.     Dental advisory given  Plan Discussed with: CRNA  Anesthesia Plan Comments:         Anesthesia Quick Evaluation

## 2024-04-15 NOTE — Op Note (Addendum)
    OPERATIVE NOTE  PROCEDURE:   Intraoperative left arm vein mapping left arm 2nd stage brachiobasilic AVF transposition  PRE-OPERATIVE DIAGNOSIS: ESRD  POST-OPERATIVE DIAGNOSIS: same as above   SURGEON: Norman GORMAN Serve MD  ASSISTANT(S): Curry Damme, PA  Given the complexity of the case,  the assistant was necessary in order to expedient the procedure and safely perform the technical aspects of the operation.  The assistant provided traction and countertraction to assist with exposure of the artery and vein.  They also assisted with suture ligation of multiple venous branches.  They played a critical role in the anastomosis. These skills, especially following the Prolene suture for the anastomosis, could not have been adequately performed by a scrub tech assistant.   ANESTHESIA: general  ESTIMATED BLOOD LOSS: 20 cc  FINDING(S): Appropriately dilated left brachiobasilic AVF Palpable and doppler thrill in AVF with multiphasic radial artery on completion  SPECIMEN(S):  none  INDICATIONS:   Philip Benjamin is a 73 y.o. male with ESRD.  The patient is currently on dialysis via [facility]. They were seen in the office for evaluation of hemodialysis access and recently underwent 1st stage brachiobasilic AVF creation. A follow up visit with a duplex demonstrated adequate size and flow and it was determined to be ready for transposition. The risks and benefits were reviewed, and the patient elected to proceed.   DESCRIPTION: The patient was brought to the operating room positioned supine on the operating table.  The left arm was prepped and draped in usual sterile fashion.  Timeout was performed and preoperative antibiotics were administered.  The case began with left arm ultrasound fistula mapping.  Multiple branches were noted and marked.  3 skip incisions were made along the course of the basilic vein.  This was carried through the subcutaneous fat to the brachiobasilic fistula.  The fistula  was mobilized and multiple branches ligated using silk ties and clips.  Once mobilized, a curved tunneler was brought on to the field, and a subcutaneous tunnel was made along the anterior aspect of the bicep.  Next, the anterior aspect of the fistula was marked and the fistula was clamped proximally and distally and transected about 2cm from the anastomosis.  The vein was pulled through the tunnel tract and reanastomosed using two 6.0 prolene suture in running quadrant fashion. The wound bed was irrigated with saline, hemostasis achieved with suture and cautery. The deeper tissue was closed with 3-0 Vicryl and the skin closed with 4-0 Monocryl.  Dermabond was applied the incisions and the arm was ACE wrapped. The patient was transferred to PACU in stable condition.    COMPLICATIONS: none apparent  CONDITION: stable  Norman GORMAN Serve MD [facility] Phone Number: [phone] 04/15/2024 10:07 AM

## 2024-04-15 NOTE — H&P (Signed)
 Patient seen and examined.  No complaints. No changes to medication history or physical exam since last seen. After discussing the risks and benefits of left arm 2nd stage brachiobasiiic transposition, JERRARD BRADBURN elected to proceed.   Norman GORMAN Serve MD      POST OPERATIVE OFFICE NOTE       CC:  F/u for surgery   HPI:  This is a 73 y.o. male who is s/p left 1st stage BVT on 01/02/2024 by Dr. Serene    Pt states he does not have pain/numbness in the left hand.  His catheter was placed by IR 08/15/2023 and is working well.    The pt is on dialysis M/W/F at [facility] location in [place].       Allergies  No Known Allergies           Current Outpatient Medications  Medication Sig Dispense Refill   acetaminophen  (TYLENOL ) 650 MG CR tablet Take 650 mg by mouth every 6 (six) hours as needed for pain.       ALPRAZolam  (XANAX ) 0.25 MG tablet Take 1 tablet (0.25 mg total) by mouth 3 (three) times daily as needed for anxiety. (Patient not taking: Reported on 01/29/2024) 10 tablet 0   atorvastatin  (LIPITOR) 40 MG tablet Take 1 tablet (40 mg total) by mouth daily. 30 tablet 0   cycloSPORINE  (RESTASIS ) 0.05 % ophthalmic emulsion SMARTSIG:In Eye(s)       Dorzolamide  HCl-Timolol  Mal PF 2-0.5 % SOLN Apply 1 drop to eye 2 (two) times daily.       folic acid  (FOLVITE ) 800 MCG tablet Take 1 tablet (800 mcg total) by mouth daily. 30 tablet 0   insulin  glargine (LANTUS ) 100 UNIT/ML Solostar Pen Inject 9 Units into the skin daily. 15 mL 11   Insulin  Pen Needle 32G X 4 MM MISC Use as directed with insulin  daily 100 each 0   Insulin  Pen Needle 32G X 4 MM MISC Use as directed with insulin  daily 100 each 0   latanoprost  (XALATAN ) 0.005 % ophthalmic solution Place 1 drop into both eyes at bedtime. (Patient taking differently: Place 1 drop into the left eye at bedtime.)       loperamide  (IMODIUM ) 2 MG capsule Take 1 capsule (2 mg total) by mouth as needed for diarrhea or loose stools. (Patient not taking:  Reported on 01/29/2024) 30 capsule 0   Methoxy PEG-Epoetin Beta (MIRCERA) 100 MCG/0.3ML SOSY Inject 90 mcg as directed every 14 (fourteen) days.       midodrine  (PROAMATINE ) 5 MG tablet Take 1 tablet (5 mg total) by mouth every Monday, Wednesday, and Friday with hemodialysis. 30 tablet 0   multivitamin (RENA-VIT) TABS tablet Take 1 tablet by mouth at bedtime. 30 tablet 0   tiZANidine  (ZANAFLEX ) 2 MG tablet Take 1 tablet (2 mg total) by mouth at bedtime. 30 tablet 0   vitamin D3 (CHOLECALCIFEROL ) 25 MCG tablet Take 3 tablets (3,000 Units total) by mouth daily. 90 tablet 0      No current facility-administered medications for this visit.         ROS:  See HPI   Physical Exam:      Today's Vitals    02/23/24 1447  BP: (!) 168/72  Pulse: 63  SpO2: 97%  PainSc: 0-No pain    There is no height or weight on file to calculate BMI.     Incision:  well healed Extremities:   There is a palpable left radial pulse.   Motor and  sensory are in tact.   There is a thrill/bruit present.  Access is  easily palpable     Dialysis Duplex on 02/23/2024: +--------------------+----------+-----------------+  AVF                PSV (cm/s)Flow Vol (mL/min)  +--------------------+----------+-----------------+  Native artery inflow   292          1794         +--------------------+----------+-----------------+  AVF Anastomosis        614                       +--------------------+----------+-----------------+     +-------------+----------+-------------+----------+--------+  OUTFLOW VEIN PSV (cm/s)Diameter (cm)Depth (cm)Describe  +-------------+----------+-------------+----------+--------+  Axillary vein   137        1.11        0.76             +-------------+----------+-------------+----------+--------+  Prox UA         127        0.76        0.96             +-------------+----------+-------------+----------+--------+  Mid UA          327        0.52         1.26             +-------------+----------+-------------+----------+--------+  Dist UA         313        0.50        1.32             +-------------+----------+-------------+----------+--------+  AC Fossa        714        0.64        0.82             +-------------+----------+-------------+----------+--------+  Left radial artery wrist: 52 cm/s Antegrade  Left ulnar artery wrist 45 cm/s Antegrade    Summary:  - Patent left upper extremity brachial-basilic arterialvenous fistula with  mildly elevated velocities observed at the anastmosis and AC fossa  outflow.  - Flow volume: 1794 mL/min  - Patent radial and ulnar arteries at the wrist with antegrade flow.      Assessment/Plan:  This is a 73 y.o. male who is s/p: 1st stage BVT on 01/02/2024 by Dr. Serene      -the pt does not have evidence of steal. -his fistula is maturing nicely and can proceed with 2nd stage BVT.  I discussed with pt and family this would require 2-3 incisions to free the vein and superficialize it.  Discussed elevated velocity with Dr. Serene and will evaluate at time of surgery if we need to redo the anastomosis.   We will get this scheduled with Dr. Serene on a non dialysis day.   -if pt has tunneled catheter, this can be removed at the discretion of the dialysis center once the pt's access has been successfully cannulated to their satisfaction.  -discussed with pt that access does not last forever and will need intervention or even new access at some point.  They expressed understanding.      Lucie Apt, [facility] [phone]

## 2024-04-15 NOTE — Transfer of Care (Signed)
 Immediate Anesthesia Transfer of Care Note  Patient: Philip Benjamin  Procedure(s) Performed: SECOND STAGE TRANSPOSITION, VEIN, BASILIC (Left: Arm Upper)  Patient Location: PACU  Anesthesia Type:General  Level of Consciousness: awake  Airway & Oxygen Therapy: Patient Spontanous Breathing and Patient connected to face mask oxygen  Post-op Assessment: Report given to RN and Post -op Vital signs reviewed and stable  Post vital signs: Reviewed and stable  Last Vitals:  Vitals Value Taken Time  BP 112/57 04/15/24 10:22  Temp    Pulse 66 04/15/24 10:25  Resp 14 04/15/24 10:25  SpO2 100 % 04/15/24 10:25  Vitals shown include unfiled device data.  Last Pain:  Vitals:   04/15/24 0616  TempSrc:   PainSc: 0-No pain         Complications: No notable events documented.

## 2024-04-16 ENCOUNTER — Encounter (HOSPITAL_COMMUNITY): Payer: Self-pay | Admitting: Vascular Surgery

## 2024-04-16 NOTE — Anesthesia Postprocedure Evaluation (Signed)
 Anesthesia Post Note  Patient: Philip Benjamin  Procedure(s) Performed: SECOND STAGE TRANSPOSITION, VEIN, BASILIC (Left: Arm Upper)     Patient location during evaluation: PACU Anesthesia Type: General Level of consciousness: awake and alert Pain management: pain level controlled Vital Signs Assessment: post-procedure vital signs reviewed and stable Respiratory status: spontaneous breathing, nonlabored ventilation and respiratory function stable Cardiovascular status: blood pressure returned to baseline and stable Postop Assessment: no apparent nausea or vomiting Anesthetic complications: no   No notable events documented.                  Paizlee Kinder

## 2024-04-19 NOTE — Progress Notes (Signed)
 History of Present Illness: This man is here today for follow-up.  Original presentation was to follow-up urinary retention.  His catheter was removed on the 10th of last month.  He had revision of his left AV fistula last week.  With that he had a little bit of slowdown of urination but altogether he is voiding about the same now.  Urine has been smelling strong but he is not having any dysuria or blood in his urine.  Past Medical History:  Diagnosis Date   BPH (benign prostatic hyperplasia)    Chronic kidney disease    ESRD- on HD   Diabetes mellitus without complication (HCC)    Glaucoma    Hypertension     Past Surgical History:  Procedure Laterality Date   AV FISTULA PLACEMENT Left 01/02/2024   Procedure: ARTERIOVENOUS (AV) FISTULA CREATION;  Surgeon: Serene Gaile ORN, MD;  Location: [facility] OR;  Service: Vascular;  Laterality: Left;   BASCILIC VEIN TRANSPOSITION Left 04/15/2024   Procedure: SECOND STAGE TRANSPOSITION, VEIN, BASILIC;  Surgeon: Pearline Norman RAMAN, MD;  Location: [facility] OR;  Service: Vascular;  Laterality: Left;   EYE SURGERY     INGUINAL HERNIA REPAIR Bilateral 01/23/2018   Procedure: BILATERAL OPEN INGUINAL HERNIA REPAIR WITH MESH;  Surgeon: Kimble Agent, MD;  Location: [facility] OR;  Service: General;  Laterality: Bilateral;   INSERTION OF MESH Bilateral 01/23/2018   Procedure: INSERTION OF MESH;  Surgeon: Kimble Agent, MD;  Location: [facility] OR;  Service: General;  Laterality: Bilateral;   IR FLUORO GUIDE CV LINE RIGHT  08/15/2023   IR PATIENT EVAL TECH 0-60 MINS  11/18/2023   IR PERC CHOLECYSTOSTOMY  10/06/2023   IR US  GUIDE BX ASP/DRAIN  10/06/2023   IR US  GUIDE VASC ACCESS RIGHT  08/15/2023   VASECTOMY     ~1985    Home Medications:  Allergies as of 04/20/2024   No Known Allergies      Medication List        Accurate as of April 19, 2024  1:06 PM. If you have any questions, ask your nurse or doctor.          acetaminophen  650 MG CR tablet Commonly known as: TYLENOL  Take  650 mg by mouth every 6 (six) hours as needed for pain.   ALPRAZolam  0.25 MG tablet Commonly known as: XANAX  Take 1 tablet (0.25 mg total) by mouth 3 (three) times daily as needed for anxiety.   aspirin  EC 81 MG tablet Take 81 mg by mouth daily. Swallow whole.   atorvastatin  40 MG tablet Commonly known as: LIPITOR Take 1 tablet (40 mg total) by mouth daily. What changed: how much to take   cycloSPORINE  0.05 % ophthalmic emulsion Commonly known as: RESTASIS  Place 1 drop into both eyes 2 (two) times daily.   Dorzolamide  HCl-Timolol  Mal PF 2-0.5 % Soln Place 1 drop into both eyes 2 (two) times daily.   FLUoxetine  20 MG capsule Commonly known as: PROZAC  Take 20 mg by mouth daily.   folic acid  800 MCG tablet Commonly known as: FOLVITE  Take 1 tablet (800 mcg total) by mouth daily.   Iyuzeh  0.005 % Soln Generic drug: Latanoprost  PF Place 1 drop into the left eye at bedtime.   Lantus  SoloStar 100 UNIT/ML Solostar Pen Generic drug: insulin  glargine Inject 9 Units into the skin daily. What changed:  how much to take when to take this   loperamide  2 MG capsule Commonly known as: IMODIUM  Take 1 capsule (2 mg total) by  mouth as needed for diarrhea or loose stools.   midodrine  5 MG tablet Commonly known as: PROAMATINE  Take 1 tablet (5 mg total) by mouth every Monday, Wednesday, and Friday with hemodialysis.   Mircera 100 MCG/0.3ML Sosy Generic drug: Methoxy PEG-Epoetin Beta Inject 90 mcg as directed every 14 (fourteen) days.   multivitamin Tabs tablet Take 1 tablet by mouth at bedtime.   ondansetron  4 MG tablet Commonly known as: ZOFRAN  Take 4 mg by mouth 2 (two) times daily.   oxyCODONE -acetaminophen  5-325 MG tablet Commonly known as: Percocet Take 1 tablet by mouth every 6 (six) hours as needed for severe pain (pain score 7-10).   tamsulosin  0.4 MG Caps capsule Commonly known as: FLOMAX  TAKE 1 CAPSULE(0.4 MG) BY MOUTH TWICE DAILY   TechLite Plus Pen Needles 32G  X 4 MM Misc Generic drug: Insulin  Pen Needle Use as directed with insulin  daily   Insulin  Pen Needle 32G X 4 MM Misc Use as directed with insulin  daily   tiZANidine  2 MG tablet Commonly known as: ZANAFLEX  Take 1 tablet (2 mg total) by mouth at bedtime.   vitamin D3 25 MCG tablet Commonly known as: CHOLECALCIFEROL  Take 3 tablets (3,000 Units total) by mouth daily.        Allergies: No Known Allergies  No family history on file.  Social History:  reports that he has never smoked. He has never used smokeless tobacco. He reports that he does not drink alcohol and does not use drugs.  ROS: A complete review of systems was performed.  All systems are negative except for pertinent findings as noted.    I have reviewed prior pt notes  Bladder scan reviewed-volume  I have reviewed prior urine culture   Impression/Assessment:  Urinary retention, resolved  Plan:  I discussed with the patient and his family that we may be dealing with eventual colonization/asymptomatic pyuria  I will have him come back in about 4 months to recheck residual urine and UA urinalysis  I did let them know that if they are concerned about his urine they can drop off a specimen in the future

## 2024-04-20 ENCOUNTER — Ambulatory Visit: Admitting: Urology

## 2024-04-20 VITALS — BP 126/64 | HR 75

## 2024-04-20 DIAGNOSIS — Z87898 Personal history of other specified conditions: Secondary | ICD-10-CM | POA: Diagnosis not present

## 2024-04-20 DIAGNOSIS — Z09 Encounter for follow-up examination after completed treatment for conditions other than malignant neoplasm: Secondary | ICD-10-CM

## 2024-04-20 DIAGNOSIS — Z87440 Personal history of urinary (tract) infections: Secondary | ICD-10-CM

## 2024-04-20 DIAGNOSIS — N401 Enlarged prostate with lower urinary tract symptoms: Secondary | ICD-10-CM

## 2024-04-20 LAB — BLADDER SCAN AMB NON-IMAGING: Scan Result: 6

## 2024-04-22 ENCOUNTER — Inpatient Hospital Stay

## 2024-04-29 ENCOUNTER — Inpatient Hospital Stay: Admitting: Oncology

## 2024-05-04 ENCOUNTER — Other Ambulatory Visit: Payer: Self-pay | Admitting: Urology

## 2024-05-04 DIAGNOSIS — N401 Enlarged prostate with lower urinary tract symptoms: Secondary | ICD-10-CM

## 2024-05-04 DIAGNOSIS — Z978 Presence of other specified devices: Secondary | ICD-10-CM

## 2024-05-10 ENCOUNTER — Ambulatory Visit: Attending: Surgery | Admitting: Physician Assistant

## 2024-05-10 VITALS — BP 115/62 | HR 84 | Temp 97.9°F | Wt 209.0 lb

## 2024-05-10 DIAGNOSIS — N186 End stage renal disease: Secondary | ICD-10-CM

## 2024-05-10 NOTE — Progress Notes (Signed)
 POST OPERATIVE OFFICE NOTE    CC:  F/u for surgery  HPI:  This is a 73 y.o. male who is s/p left 2nd stage BVT on 04/15/2024 by Dr. Pearline.  1st stage BVT was performed on 01/02/2024 by Dr. Serene.  He has a [facility] that was placed by IR on 08/15/2023  Pt is here with his wife and daughter.  Pt states he does not have pain/numbness in the left hand.  His daughter states he did have some swelling in the left arm and they kept his arm elevated and it did improve.  The pt is on dialysis M/W/F at [facility] location in [place] on [address].    No Known Allergies  Current Outpatient Medications  Medication Sig Dispense Refill   acetaminophen  (TYLENOL ) 650 MG CR tablet Take 650 mg by mouth every 6 (six) hours as needed for pain.     ALPRAZolam  (XANAX ) 0.25 MG tablet Take 1 tablet (0.25 mg total) by mouth 3 (three) times daily as needed for anxiety. 10 tablet 0   aspirin  EC 81 MG tablet Take 81 mg by mouth daily. Swallow whole.     atorvastatin  (LIPITOR) 40 MG tablet Take 1 tablet (40 mg total) by mouth daily. (Patient taking differently: Take 20 mg by mouth daily.) 30 tablet 0   cycloSPORINE  (RESTASIS ) 0.05 % ophthalmic emulsion Place 1 drop into both eyes 2 (two) times daily.     Dorzolamide  HCl-Timolol  Mal PF 2-0.5 % SOLN Place 1 drop into both eyes 2 (two) times daily.     FLUoxetine  (PROZAC ) 20 MG capsule Take 20 mg by mouth daily.     folic acid  (FOLVITE ) 800 MCG tablet Take 1 tablet (800 mcg total) by mouth daily. 30 tablet 0   insulin  glargine (LANTUS ) 100 UNIT/ML Solostar Pen Inject 9 Units into the skin daily. (Patient taking differently: Inject 20 Units into the skin in the morning.) 15 mL 11   Insulin  Pen Needle 32G X 4 MM MISC Use as directed with insulin  daily 100 each 0   Insulin  Pen Needle 32G X 4 MM MISC Use as directed with insulin  daily 100 each 0   Latanoprost  PF (IYUZEH ) 0.005 % SOLN Place 1 drop into the left eye at bedtime.     loperamide  (IMODIUM ) 2 MG capsule Take 1 capsule (2  mg total) by mouth as needed for diarrhea or loose stools. 30 capsule 0   Methoxy PEG-Epoetin Beta (MIRCERA) 100 MCG/0.3ML SOSY Inject 90 mcg as directed every 14 (fourteen) days.     midodrine  (PROAMATINE ) 5 MG tablet Take 1 tablet (5 mg total) by mouth every Monday, Wednesday, and Friday with hemodialysis. 30 tablet 0   multivitamin (RENA-VIT) TABS tablet Take 1 tablet by mouth at bedtime. 30 tablet 0   ondansetron  (ZOFRAN ) 4 MG tablet Take 4 mg by mouth 2 (two) times daily.     oxyCODONE -acetaminophen  (PERCOCET) 5-325 MG tablet Take 1 tablet by mouth every 6 (six) hours as needed for severe pain (pain score 7-10). 20 tablet 0   tamsulosin  (FLOMAX ) 0.4 MG CAPS capsule TAKE 1 CAPSULE(0.4 MG) BY MOUTH TWICE DAILY 60 capsule 0   tiZANidine  (ZANAFLEX ) 2 MG tablet Take 1 tablet (2 mg total) by mouth at bedtime. 30 tablet 0   vitamin D3 (CHOLECALCIFEROL ) 25 MCG tablet Take 3 tablets (3,000 Units total) by mouth daily. 90 tablet 0   No current facility-administered medications for this visit.     ROS:  See HPI  Physical Exam:  Today's Vitals   05/10/24 1419  BP: 115/62  Pulse: 84  Temp: 97.9 F (36.6 C)  TempSrc: Temporal  Weight: 209 lb (94.8 kg)  PainSc: 0-No pain   Body mass index is 27.57 kg/m.   Incision:  all incisions have healed nicely Extremities:   There is not a palpable left radial pulse.   Motor and sensory are in tact.   There is a thrill/bruit present.  Access is  easily palpable     Assessment/Plan:  This is a 73 y.o. male who is s/p:  left 2nd stage BVT on 04/15/2024 by Dr. Pearline.  1st stage BVT was performed on 01/02/2024 by Dr. Serene.   -the pt does not have evidence of steal. -pt's incisions have healed nicely.  pt's access can be used now. -if pt has tunneled catheter, this can be removed at the discretion of the dialysis center once the pt's access has been successfully cannulated to their satisfaction.  -discussed with pt that access does not last  forever and will need intervention or even new access at some point.  -the pt will follow up as needed.   Lucie Apt, [facility] [phone]  Clinic MD:  Gretta on call MD

## 2024-08-03 ENCOUNTER — Encounter (HOSPITAL_COMMUNITY): Payer: Self-pay

## 2024-08-05 ENCOUNTER — Other Ambulatory Visit: Payer: Self-pay

## 2024-08-05 ENCOUNTER — Encounter (HOSPITAL_COMMUNITY)
Admission: RE | Disposition: A | Discharge status: Home / Self Care | Payer: Self-pay | Source: Home / Self Care | Attending: Surgery

## 2024-08-05 ENCOUNTER — Encounter (HOSPITAL_COMMUNITY): Payer: Self-pay | Admitting: Surgery

## 2024-08-05 ENCOUNTER — Ambulatory Visit (HOSPITAL_COMMUNITY)
Admission: RE | Admit: 2024-08-05 | Discharge: 2024-08-05 | Disposition: A | Discharge status: Home / Self Care | Attending: Surgery | Admitting: Surgery

## 2024-08-05 DIAGNOSIS — N186 End stage renal disease: Secondary | ICD-10-CM | POA: Diagnosis not present

## 2024-08-05 DIAGNOSIS — Y832 Surgical operation with anastomosis, bypass or graft as the cause of abnormal reaction of the patient, or of later complication, without mention of misadventure at the time of the procedure: Secondary | ICD-10-CM | POA: Diagnosis not present

## 2024-08-05 DIAGNOSIS — Z992 Dependence on renal dialysis: Secondary | ICD-10-CM | POA: Diagnosis not present

## 2024-08-05 DIAGNOSIS — T82858A Stenosis of vascular prosthetic devices, implants and grafts, initial encounter: Secondary | ICD-10-CM | POA: Diagnosis present

## 2024-08-05 HISTORY — PX: VENOUS ANGIOPLASTY: CATH118376

## 2024-08-05 HISTORY — PX: A/V FISTULAGRAM: CATH118298

## 2024-08-05 LAB — GLUCOSE, CAPILLARY: Glucose-Capillary: 157 mg/dL — ABNORMAL HIGH (ref 70–99)

## 2024-08-05 SURGERY — A/V FISTULAGRAM
Anesthesia: LOCAL | Site: Arm Upper | Laterality: Left

## 2024-08-05 MED ORDER — LIDOCAINE HCL (PF) 1 % IJ SOLN
INTRAMUSCULAR | Status: DC | PRN
  Administered 2024-08-05: 2 mL via SUBCUTANEOUS

## 2024-08-05 MED ORDER — HEPARIN (PORCINE) IN NACL 1000-0.9 UT/500ML-% IV SOLN
INTRAVENOUS | Status: DC | PRN
  Administered 2024-08-05: 500 mL

## 2024-08-05 MED ORDER — IODIXANOL 320 MG/ML IV SOLN
INTRAVENOUS | Status: DC | PRN
  Administered 2024-08-05: 25 mL via INTRAVENOUS

## 2024-08-05 SURGICAL SUPPLY — 11 items
BALLOON MUSTANG 7.0X40 75 (BALLOONS) IMPLANT
BALLOON MUSTANG 9X40X75 (BALLOONS) IMPLANT
DEVICE INFLATION ENCORE 26 (MISCELLANEOUS) IMPLANT
KIT MICROPUNCTURE NIT STIFF (SHEATH) IMPLANT
MAT PREVALON FULL STRYKER (MISCELLANEOUS) IMPLANT
SHEATH PINNACLE R/O II 6F 4CM (SHEATH) IMPLANT
SHEATH PROBE COVER 6X72 (BAG) IMPLANT
STOPCOCK MORSE 400PSI 3WAY (MISCELLANEOUS) IMPLANT
TRAY PV CATH (CUSTOM PROCEDURE TRAY) ×2 IMPLANT
TUBING CIL FLEX 10 FLL-RA (TUBING) IMPLANT
WIRE BENTSON .035X145CM (WIRE) IMPLANT

## 2024-08-05 NOTE — Op Note (Signed)
    Patient name: PISTOL KESSENICH MRN: [id number] DOB: [birth year] Sex: male  08/05/2024 Pre-operative Diagnosis: ESRD Post-operative diagnosis:  Same Surgeon:  Malvina New Procedure Performed:  1.  Ultrasound-guided access, left basilic vein  2.  Fistulogram  3.  Balloon venoplasty, peripheral vein  Indications: This is a 73 year old gentleman who is having trouble with flow rates through his left basilic vein fistula and is here for fistulogram  Procedure:  The patient was identified in the holding area and taken to room 8.  The patient was then placed supine on the table and prepped and draped in the usual sterile fashion.  A time out was called.  Yltrasound was used to evaluate the fistula.  The vein was patent and compressible.  A digital ultrasound image was acquired.  The fistula was then accessed under ultrasound guidance using a micropuncture needle.  An 018 wire was then asvanced without resistance and a micropuncture sheath was placed.  Contrast injections were then performed through the sheath.  Findings: The central venous system was widely patent.  The arteriovenous anastomosis was widely patent.  In the midportion of the fistula there was an area of stenosis.  This looks like more angulation from the tunneling, however I elected to intervene here   Intervention: After the above images were acquired decision made to proceed with intervention.  Over a Bentson wire a 6 French sheath was placed.  I first treated the area of concern with a 7 x 40 Mustang balloon with suboptimal results.  I then upsized to a 9 x 40 balloon and saw significant improvement.  Once satisfied with these results.  Balloon and wire were removed.  Monocryl was used for skin closure after sheath removal.  Impression:  #1  Successful balloon venoplasty of a mid fistula stenosis with a 9 x 40 balloon.  #2  Fistula remains amenable to any future percutaneous interventions   V. Malvina New, M.D.,  [facility] [place] Office: [phone] Pager:  [phone]

## 2024-08-05 NOTE — H&P (Signed)
   Patient name: Philip Benjamin MRN: [id number] DOB: 04/24/1951 Sex: male  REASON FOR VISIT:     ESRD  HISTORY OF PRESENT ILLNESS:   Philip Benjamin is a 73 y.o. male who is status post left arm 2nd stage basilic vein fistula on 04-15-2024.  He is having trouble with fistula cannulation and bleeding.  CURRENT MEDICATIONS:    No current facility-administered medications for this encounter.    REVIEW OF SYSTEMS:   [X]  denotes positive finding, [ ]  denotes negative finding Cardiac  Comments:  Chest pain or chest pressure:    Shortness of breath upon exertion:    Short of breath when lying flat:    Irregular heart rhythm:    Constitutional    Fever or chills:      PHYSICAL EXAM:   Vitals:   08/05/24 1051  BP: 132/75  Pulse: 62  Temp: 98.1 F (36.7 C)  TempSrc: Oral  SpO2: 99%    GENERAL: The patient is a well-nourished male, in no acute distress. The vital signs are documented above. CARDIOVASCULAR: There is a regular rate and rhythm. PULMONARY: Non-labored respirations Palpable thrill in left arm fistula  STUDIES:      MEDICAL ISSUES:   ESRD:  I discussed proceeding with left arm fistulogram and intervention as indicated.  All questions answered  Malvina Serene CLORE, MD, FACS [facility] [phone] Pager [phone]

## 2024-08-23 NOTE — Progress Notes (Signed)
 Impression/Assessment:  Urinary retention, resolved  Plan:  I discussed with the patient and his family that we may be dealing with eventual colonization/asymptomatic pyuria  I will have him come back in about 4 months to recheck residual urine and UA urinalysis  I did let them know that if they are concerned about his urine they can drop off a specimen in the future    History of Present Illness:  7.1.2025:  This man is here today for follow-up.  Original presentation was to follow-up urinary retention.  His catheter was removed on the 10th of last month.  He had revision of his left AV fistula last week.  With that he had a little bit of slowdown of urination but altogether he is voiding about the same now.  Urine has been smelling strong but he is not having any dysuria or blood in his urine.  Past Medical History:  Diagnosis Date   BPH (benign prostatic hyperplasia)    Chronic kidney disease    ESRD- on HD   Diabetes mellitus without complication (HCC)    Glaucoma    Hypertension     Past Surgical History:  Procedure Laterality Date   A/V FISTULAGRAM Left 08/05/2024   Procedure: A/V Fistulagram;  Surgeon: Serene Gaile ORN, MD;  Location: [facility];  Service: Cardiovascular;  Laterality: Left;   AV FISTULA PLACEMENT Left 01/02/2024   Procedure: ARTERIOVENOUS (AV) FISTULA CREATION;  Surgeon: Serene Gaile ORN, MD;  Location: [facility];  Service: Vascular;  Laterality: Left;   BASCILIC VEIN TRANSPOSITION Left 04/15/2024   Procedure: SECOND STAGE TRANSPOSITION, VEIN, BASILIC;  Surgeon: Pearline Norman RAMAN, MD;  Location: [facility];  Service: Vascular;  Laterality: Left;   EYE SURGERY     INGUINAL HERNIA REPAIR Bilateral 01/23/2018   Procedure: BILATERAL OPEN INGUINAL HERNIA REPAIR WITH MESH;  Surgeon: Kimble Agent, MD;  Location: [facility];  Service: General;  Laterality: Bilateral;   INSERTION OF MESH Bilateral 01/23/2018   Procedure: INSERTION OF MESH;  Surgeon: Kimble Agent, MD;   Location: [facility] OR;  Service: General;  Laterality: Bilateral;   IR FLUORO GUIDE CV LINE RIGHT  08/15/2023   IR PATIENT EVAL TECH 0-60 MINS  11/18/2023   IR PERC CHOLECYSTOSTOMY  10/06/2023   IR US  GUIDE BX ASP/DRAIN  10/06/2023   IR US  GUIDE VASC ACCESS RIGHT  08/15/2023   VASECTOMY     ~1985   VENOUS ANGIOPLASTY Left 08/05/2024   Procedure: VENOUS ANGIOPLASTY;  Surgeon: Serene Gaile ORN, MD;  Location: [facility];  Service: Cardiovascular;  Laterality: Left;  Outflow Basilic Vein    Home Medications:  Allergies as of 08/24/2024   No Known Allergies      Medication List        Accurate as of August 23, 2024  7:14 PM. If you have any questions, ask your nurse or doctor.          acetaminophen  650 MG CR tablet Commonly known as: TYLENOL  Take 650 mg by mouth every 6 (six) hours as needed for pain.   ALPRAZolam  0.25 MG tablet Commonly known as: XANAX  Take 1 tablet (0.25 mg total) by mouth 3 (three) times daily as needed for anxiety.   aspirin  EC 81 MG tablet Take 81 mg by mouth daily. Swallow whole.   atorvastatin  40 MG tablet Commonly known as: LIPITOR Take 1 tablet (40 mg total) by mouth daily. What changed: how much to take   cycloSPORINE  0.05 % ophthalmic emulsion Commonly  known as: RESTASIS  Place 1 drop into both eyes 2 (two) times daily.   Dorzolamide  HCl-Timolol  Mal PF 2-0.5 % Soln Place 1 drop into both eyes 2 (two) times daily.   FLUoxetine  20 MG capsule Commonly known as: PROZAC  Take 20 mg by mouth daily.   folic acid  800 MCG tablet Commonly known as: FOLVITE  Take 1 tablet (800 mcg total) by mouth daily.   Iyuzeh  0.005 % Soln Generic drug: Latanoprost  PF Place 1 drop into the left eye at bedtime.   Lantus  SoloStar 100 UNIT/ML Solostar Pen Generic drug: insulin  glargine Inject 9 Units into the skin daily. What changed:  how much to take when to take this   loperamide  2 MG capsule Commonly known as: IMODIUM  Take 1 capsule (2 mg total) by mouth  as needed for diarrhea or loose stools.   midodrine  5 MG tablet Commonly known as: PROAMATINE  Take 1 tablet (5 mg total) by mouth every Monday, Wednesday, and Friday with hemodialysis.   Mircera 100 MCG/0.3ML Sosy Generic drug: Methoxy PEG-Epoetin Beta Inject 90 mcg as directed every 14 (fourteen) days.   multivitamin Tabs tablet Take 1 tablet by mouth at bedtime.   ondansetron  4 MG tablet Commonly known as: ZOFRAN  Take 4 mg by mouth 2 (two) times daily.   oxyCODONE -acetaminophen  5-325 MG tablet Commonly known as: Percocet Take 1 tablet by mouth every 6 (six) hours as needed for severe pain (pain score 7-10).   tamsulosin  0.4 MG Caps capsule Commonly known as: FLOMAX  TAKE 1 CAPSULE(0.4 MG) BY MOUTH TWICE DAILY   TechLite Plus Pen Needles 32G X 4 MM Misc Generic drug: Insulin  Pen Needle Use as directed with insulin  daily   Insulin  Pen Needle 32G X 4 MM Misc Use as directed with insulin  daily   tiZANidine  2 MG tablet Commonly known as: ZANAFLEX  Take 1 tablet (2 mg total) by mouth at bedtime.   vitamin D3 25 MCG tablet Commonly known as: CHOLECALCIFEROL  Take 3 tablets (3,000 Units total) by mouth daily.        Allergies: No Known Allergies  No family history on file.  Social History:  reports that he has never smoked. He has never used smokeless tobacco. He reports that he does not drink alcohol and does not use drugs.  ROS: A complete review of systems was performed.  All systems are negative except for pertinent findings as noted.    I have reviewed prior pt notes  Bladder scan reviewed-volume  I have reviewed prior urine culture

## 2024-08-24 ENCOUNTER — Ambulatory Visit: Admitting: Urology

## 2024-08-24 VITALS — BP 166/79 | HR 83

## 2024-08-24 DIAGNOSIS — R8281 Pyuria: Secondary | ICD-10-CM

## 2024-08-24 DIAGNOSIS — N401 Enlarged prostate with lower urinary tract symptoms: Secondary | ICD-10-CM

## 2024-08-24 DIAGNOSIS — Z87440 Personal history of urinary (tract) infections: Secondary | ICD-10-CM

## 2024-08-24 DIAGNOSIS — Z992 Dependence on renal dialysis: Secondary | ICD-10-CM

## 2024-11-11 ENCOUNTER — Ambulatory Visit (HOSPITAL_COMMUNITY): Admission: RE | Admit: 2024-11-11 | Source: Home / Self Care | Admitting: Surgery

## 2024-11-11 ENCOUNTER — Encounter (HOSPITAL_COMMUNITY): Admission: RE | Payer: Self-pay | Source: Home / Self Care

## 2024-11-11 SURGERY — A/V FISTULAGRAM
Anesthesia: LOCAL | Site: Arm Upper | Laterality: Left

## 2024-11-23 ENCOUNTER — Telehealth: Payer: Self-pay

## 2024-11-23 DIAGNOSIS — R310 Gross hematuria: Secondary | ICD-10-CM

## 2024-11-30 ENCOUNTER — Other Ambulatory Visit

## 2025-03-07 ENCOUNTER — Ambulatory Visit: Admitting: Urology
